# Patient Record
Sex: Female | Born: 1995 | Race: Black or African American | Hispanic: No | Marital: Single | Smoking: Never smoker
Health system: Southern US, Community
[De-identification: ages and names within clinical notes are randomized; demographics above are authoritative.]

## PROBLEM LIST (undated history)

## (undated) ENCOUNTER — Emergency Department (HOSPITAL_COMMUNITY): Admission: EM | Payer: Medicare HMO | Source: Home / Self Care

## (undated) DIAGNOSIS — I10 Essential (primary) hypertension: Secondary | ICD-10-CM

## (undated) DIAGNOSIS — H409 Unspecified glaucoma: Secondary | ICD-10-CM

## (undated) DIAGNOSIS — L309 Dermatitis, unspecified: Secondary | ICD-10-CM

## (undated) DIAGNOSIS — J45909 Unspecified asthma, uncomplicated: Secondary | ICD-10-CM

## (undated) HISTORY — PX: PORTA CATH INSERTION: CATH118285

## (undated) HISTORY — DX: Morbid (severe) obesity due to excess calories: E66.01

## (undated) HISTORY — DX: Dermatitis, unspecified: L30.9

## (undated) HISTORY — PX: OTHER SURGICAL HISTORY: SHX169

## (undated) HISTORY — PX: CATARACT EXTRACTION: SUR2

---
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
## 2017-01-11 ENCOUNTER — Other Ambulatory Visit: Payer: Self-pay | Admitting: Allergy & Immunology

## 2017-01-12 ENCOUNTER — Ambulatory Visit (INDEPENDENT_AMBULATORY_CARE_PROVIDER_SITE_OTHER): Payer: Medicaid Other | Admitting: *Deleted

## 2017-01-12 DIAGNOSIS — J309 Allergic rhinitis, unspecified: Secondary | ICD-10-CM

## 2017-01-16 ENCOUNTER — Ambulatory Visit (INDEPENDENT_AMBULATORY_CARE_PROVIDER_SITE_OTHER): Payer: Medicaid Other | Admitting: Allergy & Immunology

## 2017-01-16 VITALS — BP 122/78 | HR 88 | Temp 98.4°F | Resp 20

## 2017-01-16 DIAGNOSIS — J4540 Moderate persistent asthma, uncomplicated: Secondary | ICD-10-CM | POA: Diagnosis not present

## 2017-01-16 DIAGNOSIS — J3089 Other allergic rhinitis: Secondary | ICD-10-CM | POA: Diagnosis not present

## 2017-01-16 DIAGNOSIS — L2084 Intrinsic (allergic) eczema: Secondary | ICD-10-CM | POA: Insufficient documentation

## 2017-01-16 DIAGNOSIS — J302 Other seasonal allergic rhinitis: Secondary | ICD-10-CM

## 2017-01-16 DIAGNOSIS — J309 Allergic rhinitis, unspecified: Secondary | ICD-10-CM

## 2017-01-16 DIAGNOSIS — H1010 Acute atopic conjunctivitis, unspecified eye: Secondary | ICD-10-CM | POA: Diagnosis not present

## 2017-01-16 MED ORDER — BUDESONIDE-FORMOTEROL FUMARATE 160-4.5 MCG/ACT IN AERO: 2.0000 | INHALATION_SPRAY | Freq: Two times a day (BID) | RESPIRATORY_TRACT | 5 refills | Status: DC

## 2017-01-16 MED ORDER — FLUTICASONE PROPIONATE HFA 110 MCG/ACT IN AERO: 2.0000 | INHALATION_SPRAY | Freq: Two times a day (BID) | RESPIRATORY_TRACT | 5 refills | Status: DC

## 2017-01-16 MED ORDER — HYDROXYZINE HCL 10 MG PO TABS: 10.0000 mg | ORAL_TABLET | Freq: Every day | ORAL | 5 refills | Status: DC | PRN

## 2017-01-16 MED ORDER — ALBUTEROL SULFATE HFA 108 (90 BASE) MCG/ACT IN AERS: 2.0000 | INHALATION_SPRAY | Freq: Four times a day (QID) | RESPIRATORY_TRACT | 1 refills | Status: DC | PRN

## 2017-01-16 MED ORDER — MONTELUKAST SODIUM 10 MG PO TABS: 10.0000 mg | ORAL_TABLET | Freq: Every day | ORAL | 5 refills | Status: DC

## 2017-01-16 MED ORDER — PIMECROLIMUS 1 % EX CREA: 1.0000 "application " | TOPICAL_CREAM | Freq: Every day | CUTANEOUS | 5 refills | Status: DC | PRN

## 2017-01-16 MED ORDER — CETIRIZINE HCL 10 MG PO TABS: 10.0000 mg | ORAL_TABLET | Freq: Every day | ORAL | 5 refills | Status: DC

## 2017-01-16 MED ORDER — EPINEPHRINE 0.3 MG/0.3ML IJ SOAJ: 0.3000 mg | Freq: Once | INTRAMUSCULAR | 1 refills | Status: AC

## 2017-01-16 MED ORDER — OLOPATADINE HCL 0.1 % OP SOLN: 1.0000 [drp] | Freq: Two times a day (BID) | OPHTHALMIC | 3 refills | Status: DC
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
## 2017-01-16 NOTE — Patient Instructions (Addendum)
1. Moderate persistent asthma, uncomplicated - Lung function looks good today. - Daily controller medication(s): Symbicort 160/4.5 two puffs twice daily with spacer - Rescue medications: ProAir 4 puffs every 4-6 hours as needed - Changes during respiratory infections or worsening symptoms: add Flovent 110mcg to 2 puffs twice daily for ONE TO TWO WEEKS - Asthma control goals:  * Full participation in all desired activities (may need albuterol before activity) * Albuterol use two time or less a week on average (not counting use with activity) * Cough interfering with sleep two time or less a month * Oral steroids no more than once a year * No hospitalizations  2. Chronic allergic rhinitis  - Continue with allergy shots, but we will change you to a faster schedule.  - Continue with Singulair, cetirizine, and Astelin.   3. Atopic dermatitis - on Dupixent every two weeks - Continue to follow with Dr. Malena EdmanStamford.  - I will forward my note to her today.  - Continue with all of your moisturizing medications.   4. Multiple food allergies (peanuts, shellfish) - We will send in a new script for an EpiPen. - We can discuss testing again in the future.   5. Return in about 3 months (around 04/18/2017).  Please inform us of any Emergency Department visits, hospitalizations, or changes in symptoms. Call us before going to the ED for breathing or allergy symptoms since we might be able to fit you in for a sick visit. Feel free to contact us anytime with any questions, problems, or concerns.  It was a pleasure to see you again today! Best wishes for the New Year!   Websites that have reliable patient information: 1. American Academy of Asthma, Allergy, and Immunology: www.aaaai.org 2. Food Allergy Research and Education (FARE): foodallergy.org 3. Mothers of Asthmatics: http://www.asthmacommunitynetwork.org 4. American College of Allergy, Asthma, and Immunology: www.acaai.org

## 2017-02-03 ENCOUNTER — Ambulatory Visit (INDEPENDENT_AMBULATORY_CARE_PROVIDER_SITE_OTHER): Payer: Medicaid Other | Admitting: *Deleted

## 2017-02-03 DIAGNOSIS — Z23 Encounter for immunization: Secondary | ICD-10-CM

## 2017-02-07 ENCOUNTER — Ambulatory Visit (INDEPENDENT_AMBULATORY_CARE_PROVIDER_SITE_OTHER): Payer: Medicaid Other | Admitting: *Deleted

## 2017-02-07 DIAGNOSIS — J309 Allergic rhinitis, unspecified: Secondary | ICD-10-CM | POA: Diagnosis not present

## 2017-02-14 ENCOUNTER — Ambulatory Visit (INDEPENDENT_AMBULATORY_CARE_PROVIDER_SITE_OTHER): Payer: Medicaid Other | Admitting: *Deleted

## 2017-02-14 DIAGNOSIS — J309 Allergic rhinitis, unspecified: Secondary | ICD-10-CM

## 2017-02-26 ENCOUNTER — Other Ambulatory Visit: Payer: Self-pay | Admitting: Allergy and Immunology

## 2017-02-28 ENCOUNTER — Ambulatory Visit (INDEPENDENT_AMBULATORY_CARE_PROVIDER_SITE_OTHER): Payer: Medicaid Other | Admitting: *Deleted

## 2017-02-28 DIAGNOSIS — J309 Allergic rhinitis, unspecified: Secondary | ICD-10-CM

## 2017-03-16 ENCOUNTER — Ambulatory Visit: Payer: Medicaid Other | Admitting: Allergy & Immunology

## 2017-03-16 ENCOUNTER — Ambulatory Visit (INDEPENDENT_AMBULATORY_CARE_PROVIDER_SITE_OTHER): Payer: Medicaid Other | Admitting: Allergy & Immunology

## 2017-03-16 ENCOUNTER — Encounter: Payer: Self-pay | Admitting: Allergy & Immunology

## 2017-03-16 VITALS — BP 126/86 | HR 104 | Ht 70.0 in | Wt >= 6400 oz

## 2017-03-16 DIAGNOSIS — J4540 Moderate persistent asthma, uncomplicated: Secondary | ICD-10-CM | POA: Diagnosis not present

## 2017-03-16 DIAGNOSIS — J302 Other seasonal allergic rhinitis: Secondary | ICD-10-CM

## 2017-03-16 DIAGNOSIS — L2084 Intrinsic (allergic) eczema: Secondary | ICD-10-CM

## 2017-03-16 DIAGNOSIS — J3089 Other allergic rhinitis: Secondary | ICD-10-CM

## 2017-03-16 DIAGNOSIS — H1033 Unspecified acute conjunctivitis, bilateral: Secondary | ICD-10-CM

## 2017-03-16 MED ORDER — PREDNISOLONE ACETATE 1 % OP SUSP: 1.0000 [drp] | Freq: Two times a day (BID) | OPHTHALMIC | 0 refills | Status: DC

## 2017-03-16 MED ORDER — FLUTICASONE PROPIONATE HFA 110 MCG/ACT IN AERO: 2.0000 | INHALATION_SPRAY | Freq: Two times a day (BID) | RESPIRATORY_TRACT | 3 refills | Status: DC

## 2017-03-16 NOTE — Patient Instructions (Addendum)
1. Moderate persistent asthma, uncomplicated - Lung function looks good today.  - We will not make any medication changes at this time.  - Daily controller medication(s): Symbicort 160/4.5 two puffs twice daily with spacer - Rescue medications: ProAir 4 puffs every 4-6 hours as needed - Changes during respiratory infections or worsening symptoms: add Flovent 110mcg to 2 puffs twice daily for ONE TO TWO WEEKS - Asthma control goals:  * Full participation in all desired activities (may need albuterol before activity) * Albuterol use two time or less a week on average (not counting use with activity) * Cough interfering with sleep two time or less a month * Oral steroids no more than once a year * No hospitalizations  2. Chronic allergic rhinitis  - Continue with allergy shots at the same schedule.  - Continue with Singulair, cetirizine, and Astelin.   3. Atopic dermatitis - on Dupixent every two weeks with conjunctivitis - This is a common side effect of Dupixent, but I agree that I do not want to stop the medication. - Start Pred-Forte eye drops twice daily for a couple of weeks until you are back on a regular Dupixent schedule.  - Continue to follow with Dr. Malena EdmanStamford.  - I will forward my note to her today.  - Continue with all of your moisturizing medications.  - We will send your information to our Research Team for the upcoming new Dupixent study.   4. Multiple food allergies (peanuts, shellfish) - We will send in a new script for an EpiPen. - We can perform testing again at the next visit.   5. Return in about 4 months (around 07/15/2017).   Please inform us of any Emergency Department visits, hospitalizations, or changes in symptoms. Call us before going to the ED for breathing or allergy symptoms since we might be able to fit you in for a sick visit. Feel free to contact us anytime with any questions, problems, or concerns.  It was a pleasure to see you again today! Enjoy the  holiday season!  Websites that have reliable patient information: 1. American Academy of Asthma, Allergy, and Immunology: www.aaaai.org 2. Food Allergy Research and Education (FARE): foodallergy.org 3. Mothers of Asthmatics: http://www.asthmacommunitynetwork.org 4. American College of Allergy, Asthma, and Immunology: www.acaai.org
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
## 2017-03-29 ENCOUNTER — Ambulatory Visit (INDEPENDENT_AMBULATORY_CARE_PROVIDER_SITE_OTHER): Payer: Medicaid Other | Admitting: *Deleted

## 2017-03-29 DIAGNOSIS — J309 Allergic rhinitis, unspecified: Secondary | ICD-10-CM | POA: Diagnosis not present

## 2017-04-05 ENCOUNTER — Encounter: Payer: Self-pay | Admitting: *Deleted

## 2017-04-05 NOTE — Progress Notes (Signed)
VIALS MADE EXP: 04-05-18. HV 

## 2017-04-06 DIAGNOSIS — J3089 Other allergic rhinitis: Secondary | ICD-10-CM | POA: Diagnosis not present

## 2017-04-07 ENCOUNTER — Telehealth: Payer: Self-pay | Admitting: Allergy & Immunology

## 2017-04-07 DIAGNOSIS — J301 Allergic rhinitis due to pollen: Secondary | ICD-10-CM | POA: Diagnosis not present

## 2017-04-07 NOTE — Telephone Encounter (Signed)
Forwarding to nurse pool

## 2017-04-07 NOTE — Telephone Encounter (Signed)
Dr. Dellis AnesGallagher please advise on discontinuation.

## 2017-04-07 NOTE — Telephone Encounter (Signed)
I spoke with Tiffany Velasquez and let her know what Dr. Ellouise NewerGallagher's reply was. She has also been advised if the appointment in April 2019

## 2017-04-07 NOTE — Telephone Encounter (Signed)
That is fine. They were leaning towards that last time we talked.   Malachi BondsJoel Katleen Carraway, MD [facility] [place]

## 2017-04-07 NOTE — Telephone Encounter (Signed)
Mom called and wants to talk with Dr. Dellis AnesGallagher nurse about takiing her off the Dupixent. [phone]

## 2017-04-14 ENCOUNTER — Ambulatory Visit (INDEPENDENT_AMBULATORY_CARE_PROVIDER_SITE_OTHER): Payer: Medicaid Other

## 2017-04-14 DIAGNOSIS — J309 Allergic rhinitis, unspecified: Secondary | ICD-10-CM

## 2017-04-20 ENCOUNTER — Ambulatory Visit: Payer: Medicaid Other | Admitting: Allergy & Immunology

## 2017-05-03 ENCOUNTER — Ambulatory Visit (INDEPENDENT_AMBULATORY_CARE_PROVIDER_SITE_OTHER): Payer: Medicaid Other

## 2017-05-03 DIAGNOSIS — J309 Allergic rhinitis, unspecified: Secondary | ICD-10-CM

## 2017-05-18 ENCOUNTER — Ambulatory Visit (INDEPENDENT_AMBULATORY_CARE_PROVIDER_SITE_OTHER): Payer: Medicaid Other | Admitting: *Deleted

## 2017-05-18 DIAGNOSIS — J309 Allergic rhinitis, unspecified: Secondary | ICD-10-CM | POA: Diagnosis not present

## 2017-05-29 ENCOUNTER — Other Ambulatory Visit: Payer: Self-pay | Admitting: Allergy & Immunology

## 2017-06-01 ENCOUNTER — Ambulatory Visit (INDEPENDENT_AMBULATORY_CARE_PROVIDER_SITE_OTHER): Payer: Medicare Other | Admitting: *Deleted

## 2017-06-01 DIAGNOSIS — J309 Allergic rhinitis, unspecified: Secondary | ICD-10-CM

## 2017-06-05 ENCOUNTER — Ambulatory Visit (INDEPENDENT_AMBULATORY_CARE_PROVIDER_SITE_OTHER): Payer: Medicare Other | Admitting: *Deleted

## 2017-06-05 DIAGNOSIS — J309 Allergic rhinitis, unspecified: Secondary | ICD-10-CM | POA: Diagnosis not present

## 2017-06-15 ENCOUNTER — Encounter: Payer: Self-pay | Admitting: Family Medicine

## 2017-06-15 ENCOUNTER — Ambulatory Visit (INDEPENDENT_AMBULATORY_CARE_PROVIDER_SITE_OTHER): Payer: Medicare Other | Admitting: Family Medicine

## 2017-06-15 VITALS — BP 128/88 | HR 88 | Temp 98.7°F | Resp 16

## 2017-06-15 DIAGNOSIS — J4540 Moderate persistent asthma, uncomplicated: Secondary | ICD-10-CM

## 2017-06-15 DIAGNOSIS — T781XXD Other adverse food reactions, not elsewhere classified, subsequent encounter: Secondary | ICD-10-CM

## 2017-06-15 DIAGNOSIS — J4530 Mild persistent asthma, uncomplicated: Secondary | ICD-10-CM | POA: Insufficient documentation

## 2017-06-15 DIAGNOSIS — H1012 Acute atopic conjunctivitis, left eye: Secondary | ICD-10-CM | POA: Diagnosis not present

## 2017-06-15 DIAGNOSIS — J309 Allergic rhinitis, unspecified: Secondary | ICD-10-CM

## 2017-06-15 DIAGNOSIS — L2084 Intrinsic (allergic) eczema: Secondary | ICD-10-CM | POA: Diagnosis not present

## 2017-06-15 DIAGNOSIS — T781XXA Other adverse food reactions, not elsewhere classified, initial encounter: Secondary | ICD-10-CM | POA: Insufficient documentation

## 2017-06-15 DIAGNOSIS — H20042 Secondary noninfectious iridocyclitis, left eye: Secondary | ICD-10-CM | POA: Diagnosis not present

## 2017-06-15 MED ORDER — ERYTHROMYCIN 5 MG/GM OP OINT: TOPICAL_OINTMENT | OPHTHALMIC | 0 refills | Status: DC

## 2017-06-15 NOTE — Patient Instructions (Addendum)
Conjunctivitis - Begin erythromycin ophthalmic ointment. Apply 1/2 inch to the lower eye lid up to 4 times a day - Continue warm moist compresses. Do these before applying the medicine - We will make a referral to an opthalmology specialist as soon as possible.  Dr. Charlynne CousinsSteve Glenn who is the on-call ophthalmologist for the county will see you today at [address] [facility] [place], at 2:45 PM.  Phone number [phone]. - Apply patch over the eye to protect from light  Follow up in 1 month or sooner if needed

## 2017-06-15 NOTE — Progress Notes (Signed)
[address] [place] [address] Dept: [phone]  FOLLOW UP NOTE  Patient ID: Tiffany Velasquez, female    DOB: [birth year]  Age: 22 y.o. MRN: [id number] Date of Office Visit: 06/15/2017  Assessment  Chief Complaint: Conjunctivitis  HPI Tiffany Velasquez is a 22 year old female who presents to the clinic for a sick visit. She is accompanied by her mother who assists with history. She was last seen in this clinic on 03/16/2017 by Dr. Dellis Anes for evaluation of moderate persistent asthma, chronic allergic rhinitis, atopic dermatitis for which she receives Dupixent injection every 2 weeks with a side effect of conjunctivitis, and multiple food allergies including peanuts and shellfish.  At that time her asthma was well controlled with Symbicort 160-2 puffs twice a day with a spacer.  She was continued on allergy immunotherapy, Singulair, cetirizine, and Astelin nasal spray to maintain control of allergic rhinitis.  In the interim, she reports having many bouts of bilateral conjunctivitis and hordeolum which have been managed well with warm moist compresses in the past.  At today's visit, Synda reports that when she woke up this morning and opened her eyes her left eye was swollen, red and, tearing, and immediately sensitive to light.  She denies pain in either eye.  She denies any thick colored drainage in either eye.  She reports some pain in her left eye with eye movement especially looking up toward the ceiling.  She denies any changes in vision including double vision or blurry vision.  Tiffany Velasquez's asthma has been well controlled. She has not required rescue medication, experienced nocturnal awakenings due to lower respiratory symptoms, nor have activities of daily living been limited. She has required no Emergency Department or Urgent Care visits for her asthma. She has required zero courses of systemic steroids for asthma exacerbations since the last visit.  She is currently using  Symbicort 160-2 puffs twice a day with a spacer and has not needed to implement the Flovent 110 action plan.  Chronic rhinitis is been controlled with Singulair, cetirizine, and Astelin.  She reports allergy injections are going well and continues to receive shots every 1-2 weeks.  She has not had any accidental ingestion of peanuts or shellfish since her last visit here nor has she needed to use her EpiPen.  Her current medications are listed in the chart.  Drug Allergies:  Allergies  Allergen Reactions  . Peanut-Containing Drug Products Swelling    Throat swelling  . Shellfish Allergy Swelling    Throat swelling   . Apple   . Orange Fruit [Citrus]   . Peach Flavor   . Tomato   . Tree Extract     ALLERGIC TO TREE NUTS    Physical Exam: BP 128/88   Pulse 88   Temp 98.7 F (37.1 C) (Oral)   Resp 16   SpO2 96%    Physical Exam  Constitutional: She appears well-developed and well-nourished.  HENT:  Right Ear: External ear normal.  Left Ear: External ear normal.  Mouth/Throat: Oropharynx is clear and moist.  Bilateral nares slightly erythematous and edematous with clear nasal drainage noted.  Ears normal.  Pharynx normal.  Eyes: Pupils are equal, round, and reactive to light.  Bilateral conjunctive a injected left more than right.  Clear drainage dripping from left eye.  Eye movement in all cardinal fields possible.  I movement up toward the ceiling is reported as painful.  Periorbital swelling noted around left eye.  Photophobia in the  left eye to pen light.    Diagnostics: FVC 3.44, FEV1 2.90.  Predicted FVC 3.98, predicted FEV1 3.47.  Spirometry reveals normal ventilatory function.  Assessment and Plan: 1. Moderate persistent asthma without complication   2. Allergic rhinitis, unspecified seasonality, unspecified trigger   3. Intrinsic atopic dermatitis   4. Adverse food reaction, subsequent encounter   5. Acute atopic conjunctivitis of left eye     Meds ordered  this encounter  Medications  . erythromycin ophthalmic ointment    Sig: Apply 1/2 inch to lower eye lid up to 4 times a day    Dispense:  3.5 g    Refill:  0   Conjunctivitis - Begin erythromycin ophthalmic ointment. Apply 1/2 inch to the lower eye lid up to 4 times a day - Continue warm moist compresses. Do these before applying the medicine - We will make a referral to an opthalmology specialist as soon as possible.  Dr. Charlynne CousinsSteve Glenn who is the on-call ophthalmologist for the county will see you today at [address] [facility] [place], at 2:45 PM.  Phone number [phone]. - Apply patch over the eye to protect from light  Follow up in 1 month or sooner if needed   Return in about 1 month (around 07/16/2017), or if symptoms worsen or fail to improve.    Thank you for the opportunity to care for this patient.  Please do not hesitate to contact me with questions.  Thermon LeylandAnne Syd Newsome, FNP [facility] [place]

## 2017-06-19 DIAGNOSIS — H20042 Secondary noninfectious iridocyclitis, left eye: Secondary | ICD-10-CM | POA: Diagnosis not present

## 2017-06-28 ENCOUNTER — Encounter: Payer: Self-pay | Admitting: Family Medicine

## 2017-06-28 ENCOUNTER — Ambulatory Visit: Payer: Medicaid Other | Admitting: Family Medicine

## 2017-06-28 ENCOUNTER — Ambulatory Visit (INDEPENDENT_AMBULATORY_CARE_PROVIDER_SITE_OTHER): Payer: Medicare Other | Admitting: Family Medicine

## 2017-06-28 ENCOUNTER — Other Ambulatory Visit: Payer: Self-pay

## 2017-06-28 VITALS — BP 108/76 | HR 91 | Temp 98.2°F | Ht 70.0 in | Wt >= 6400 oz

## 2017-06-28 DIAGNOSIS — H1010 Acute atopic conjunctivitis, unspecified eye: Secondary | ICD-10-CM | POA: Diagnosis not present

## 2017-06-28 DIAGNOSIS — J069 Acute upper respiratory infection, unspecified: Secondary | ICD-10-CM | POA: Insufficient documentation

## 2017-06-28 DIAGNOSIS — J309 Allergic rhinitis, unspecified: Secondary | ICD-10-CM | POA: Diagnosis not present

## 2017-06-28 DIAGNOSIS — H20042 Secondary noninfectious iridocyclitis, left eye: Secondary | ICD-10-CM | POA: Diagnosis not present

## 2017-06-28 DIAGNOSIS — J029 Acute pharyngitis, unspecified: Secondary | ICD-10-CM | POA: Diagnosis not present

## 2017-06-28 LAB — POCT RAPID STREP A (OFFICE): RAPID STREP A SCREEN: NEGATIVE

## 2017-06-28 NOTE — Patient Instructions (Signed)
I think you have both a cold and springtime allergies.  The cold will get better.  The spring allergies will get worse before it gets better. No additional treatment.   No wheezing, The asthma seems under control.   See us again if you get worse.

## 2017-06-28 NOTE — Assessment & Plan Note (Signed)
Agree with avoid steroids.  Work on increased exercise.

## 2017-06-28 NOTE — Progress Notes (Signed)
   Subjective:    Patient ID: Stann MainlandKeyanna H Sur, female    DOB: 1995/08/27, 22 y.o.   MRN: [id number]   HPI 22 yo female sees allergist and is on allergy shots plus epipen. Albuterol plus one more inhalor.   Sore throat, nasal congestion, cough x 5 days.  Bad spring allergies.  This seems more than allergies.  Has asthma but no wheezing or shortness of breath.  Year round allergies but worse in spring.  Rhinnorrhea.  No fever.  No SOB.  Has taken lots of systemic steroid, weaning off in that they are contributing to her morbid obesity.      Review of Systems     Objective:   Physical Exam TMs canals partially occluded by wax.  I see portion of tMS and are normal Thoat mild cobblestone. Nose allergic transverse nasal crease. Neck no sig adenopathy Lungs clear.  No wheeze.  Normal work of breathing.  No tacchypnea.  No prolonged exp phase.        Assessment & Plan:

## 2017-06-28 NOTE — Assessment & Plan Note (Signed)
Part of symptoms are allergic.

## 2017-06-28 NOTE — Assessment & Plan Note (Signed)
Given abrupt worsening of symptoms, feel likely has a uri on top of her allergies.  Regardless, continue current treatment.  No antibiotics.

## 2017-07-19 DIAGNOSIS — H20042 Secondary noninfectious iridocyclitis, left eye: Secondary | ICD-10-CM | POA: Diagnosis not present

## 2017-07-20 ENCOUNTER — Ambulatory Visit: Payer: Medicare Other | Admitting: Allergy & Immunology

## 2017-08-09 DIAGNOSIS — H20043 Secondary noninfectious iridocyclitis, bilateral: Secondary | ICD-10-CM | POA: Diagnosis not present

## 2017-08-13 ENCOUNTER — Other Ambulatory Visit: Payer: Self-pay | Admitting: Allergy & Immunology

## 2017-08-17 ENCOUNTER — Ambulatory Visit: Payer: Self-pay | Admitting: *Deleted

## 2017-08-22 ENCOUNTER — Telehealth: Payer: Self-pay | Admitting: *Deleted

## 2017-08-22 NOTE — Telephone Encounter (Signed)
Patient came in to restart injection but was unable to give injections because her last injection was 06/05/17 - Red vial 0.2 both injections. Where would you like to restart? Please advise.

## 2017-08-22 NOTE — Telephone Encounter (Signed)
Let's back down to 0.45 mL of the [employer] of each.  Malachi Bonds, MD [facility]

## 2017-08-23 DIAGNOSIS — H20043 Secondary noninfectious iridocyclitis, bilateral: Secondary | ICD-10-CM | POA: Diagnosis not present

## 2017-08-24 ENCOUNTER — Encounter: Payer: Self-pay | Admitting: *Deleted

## 2017-08-31 ENCOUNTER — Telehealth: Payer: Self-pay | Admitting: Internal Medicine

## 2017-08-31 NOTE — Telephone Encounter (Signed)
Will forward to MD to make her aware and will call patient once orders have been placed. Jazmin Hartsell,CMA

## 2017-08-31 NOTE — Telephone Encounter (Signed)
Mother brought in form from [facility] requesting blood work to be done.  Form placed in dr Conception Chancygunadasas box.  Please let pt know when she can schedule the blood work

## 2017-09-01 ENCOUNTER — Other Ambulatory Visit: Payer: Self-pay | Admitting: Internal Medicine

## 2017-09-01 DIAGNOSIS — Z1159 Encounter for screening for other viral diseases: Secondary | ICD-10-CM

## 2017-09-01 DIAGNOSIS — Z202 Contact with and (suspected) exposure to infections with a predominantly sexual mode of transmission: Secondary | ICD-10-CM

## 2017-09-01 DIAGNOSIS — H209 Unspecified iridocyclitis: Secondary | ICD-10-CM

## 2017-09-01 DIAGNOSIS — D8989 Other specified disorders involving the immune mechanism, not elsewhere classified: Secondary | ICD-10-CM

## 2017-09-01 DIAGNOSIS — Z20828 Contact with and (suspected) exposure to other viral communicable diseases: Secondary | ICD-10-CM

## 2017-09-01 DIAGNOSIS — Z206 Contact with and (suspected) exposure to human immunodeficiency virus [HIV]: Secondary | ICD-10-CM

## 2017-09-01 NOTE — Progress Notes (Signed)
I have placed the lab orders requested by Dr. Sherrine [facility]plesGlenn with [facility] to rule out infectious and inflammatory causes of uveitis. Please call patient to have her schedule a lab appointment. [facility].   Marcy Sirenatherine Josiah Nieto, D.O. 09/01/2017, 1:21 PM PGY-3, [facility]

## 2017-09-01 NOTE — Progress Notes (Signed)
Tried to reach patient and mother but was unable to.  Will try calling again on Monday as number wasn't working.  Jazmin Hartsell,CMA

## 2017-09-01 NOTE — Progress Notes (Signed)
Lab appt made. Laia Wiley,CMA  

## 2017-09-03 ENCOUNTER — Encounter (HOSPITAL_COMMUNITY): Payer: Self-pay | Admitting: Emergency Medicine

## 2017-09-03 ENCOUNTER — Emergency Department (HOSPITAL_COMMUNITY)
Admission: EM | Admit: 2017-09-03 | Discharge: 2017-09-03 | Disposition: A | Discharge status: Home / Self Care | Payer: Medicare Other | Attending: Emergency Medicine | Admitting: Emergency Medicine

## 2017-09-03 DIAGNOSIS — Z79899 Other long term (current) drug therapy: Secondary | ICD-10-CM | POA: Insufficient documentation

## 2017-09-03 DIAGNOSIS — F909 Attention-deficit hyperactivity disorder, unspecified type: Secondary | ICD-10-CM | POA: Diagnosis not present

## 2017-09-03 DIAGNOSIS — J4530 Mild persistent asthma, uncomplicated: Secondary | ICD-10-CM | POA: Diagnosis not present

## 2017-09-03 DIAGNOSIS — L02211 Cutaneous abscess of abdominal wall: Secondary | ICD-10-CM | POA: Diagnosis not present

## 2017-09-03 DIAGNOSIS — Z91010 Allergy to peanuts: Secondary | ICD-10-CM | POA: Insufficient documentation

## 2017-09-03 DIAGNOSIS — L0291 Cutaneous abscess, unspecified: Secondary | ICD-10-CM

## 2017-09-03 MED ORDER — NAPROXEN 375 MG PO TABS: 375.0000 mg | ORAL_TABLET | Freq: Two times a day (BID) | ORAL | 0 refills | Status: DC

## 2017-09-03 MED ORDER — DOXYCYCLINE HYCLATE 100 MG PO CAPS: 100.0000 mg | ORAL_CAPSULE | Freq: Two times a day (BID) | ORAL | 0 refills | Status: DC

## 2017-09-03 MED ORDER — TETANUS-DIPHTH-ACELL PERTUSSIS 5-2.5-18.5 LF-MCG/0.5 IM SUSP
0.5000 mL | Freq: Once | INTRAMUSCULAR | Status: AC
  Administered 2017-09-03: 0.5 mL via INTRAMUSCULAR
  Filled 2017-09-03: qty 0.5

## 2017-09-03 MED ORDER — LIDOCAINE HCL (PF) 1 % IJ SOLN
5.0000 mL | Freq: Once | INTRAMUSCULAR | Status: AC
  Administered 2017-09-03: 5 mL
  Filled 2017-09-03: qty 5

## 2017-09-03 MED ORDER — IBUPROFEN 200 MG PO TABS
600.0000 mg | ORAL_TABLET | Freq: Once | ORAL | Status: AC
  Administered 2017-09-03: 600 mg via ORAL
  Filled 2017-09-03: qty 1

## 2017-09-03 MED ORDER — HYDROCODONE-ACETAMINOPHEN 5-325 MG PO TABS: 1.0000 | ORAL_TABLET | Freq: Once | ORAL | Status: DC

## 2017-09-03 NOTE — ED Triage Notes (Signed)
Pt has an abscess to right lower abdomen about quarter sized, no open area and no drainage. 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 Afebrile.

## 2017-09-03 NOTE — ED Provider Notes (Signed)
[facility] EMERGENCY [facility]MENT Provider Note   CSN: [id number] Arrival date & time: 09/03/17  1157     History   Chief Complaint Chief Complaint  Patient presents with  . Abscess    HPI Tiffany Velasquez is a 22 y.o. female who presents to the ED with abdominal pain due to an abscess. Patient reports swollen tender area about the size of a quarter that is raised and red. Patient with hx of same. Last night patient had nausea  HPI  Past Medical History:  Diagnosis Date  . Asthma   . Eczema     Patient Active Problem List   Diagnosis Date Noted  . Acute upper respiratory infection 06/28/2017  . Mild persistent asthma, uncomplicated 06/15/2017  . Acute atopic conjunctivitis of left eye 06/15/2017  . Adverse food reaction 06/15/2017  . Moderate persistent asthma without complication 01/16/2017  . Intrinsic atopic dermatitis 01/16/2017  . Low back pain 12/22/2015  . Allergic rhinoconjunctivitis 12/06/2014  . Food allergy 12/06/2014  . Chalazion of right upper eyelid 09/18/2014  . Healthcare maintenance 04/12/2014  . Asthma, chronic 06/22/2012  . Elevated blood pressure reading without diagnosis of hypertension 06/22/2012  . Family history of sudden cardiac death 08/04/2011  . Morbid obesity (HCC) 10/05/2009  . HALLUCINATIONS 10/05/2009  . CONSTIPATION, CHRONIC 04/16/2008  . ENCOPRESIS 05/25/2006  . ATTENTION DEFICIT, W/HYPERACTIVITY 05/25/2006  . ALLERGIC CONJUNCTIVITIS 05/25/2006  . Allergic rhinitis 05/25/2006  . Severe eczema 05/25/2006    Past Surgical History:  Procedure Laterality Date  . no past surgery       OB History   None      Home Medications    Prior to Admission medications   Medication Sig Start Date End Date Taking? Authorizing Provider  albuterol (PROAIR HFA) 108 (90 Base) MCG/ACT inhaler Inhale 2 puffs into the lungs every 6 (six) hours as needed for wheezing or shortness of breath. 01/16/17   Alfonse SpruceGallagher, Joel Louis, MD    AZELEX 20 % cream APPLY TO FACE D IN THE MORNING 08/17/15   [provider]  budesonide-formoterol (SYMBICORT) 160-4.5 MCG/ACT inhaler Inhale 2 puffs into the lungs 2 (two) times daily. 01/16/17   Alfonse SpruceGallagher, Joel Louis, MD  cetirizine (ZYRTEC) 10 MG tablet TAKE 1 TABLET BY MOUTH DAILY 08/14/17   Alfonse SpruceGallagher, Joel Louis, MD  ciclopirox (LOPROX) 0.77 % cream APPLY 1 APPLICATION TO ACTIVE RASH ON BODY BID PRN 12/01/16   [provider]  clindamycin (CLEOCIN T) 1 % lotion APPLY APPLICATION ON THE FACE D QAM 08/17/15   [provider]  clobetasol ointment (TEMOVATE) 0.05 % APPLY ON THE SKIN BID FOR ACTIVE ECZEMA 11/10/15   [provider]  desonide (DESOWEN) 0.05 % ointment APP APPLICATION ON FACE BID AS DIRECTED 12/17/15   [provider]  doxycycline (VIBRAMYCIN) 100 MG capsule Take 1 capsule (100 mg total) by mouth 2 (two) times daily. 09/03/17   Janne NapoleonNeese, Jamilex Bohnsack M, NP  DUPIXENT 300 MG/2ML SOSY INJECT 1 SYRINGE (300 MG) UNDER THE SKIN EVERY 14 DAYS Patient not taking: Reported on 06/15/2017 02/27/17   Jessica PriestKozlow, Eric J, MD  erythromycin ophthalmic ointment Apply 1/2 inch to lower eye lid up to 4 times a day 06/15/17   Hetty BlendAmbs, Anne M, FNP  fluocinonide cream (LIDEX) 0.05 %  01/22/14   [provider]  fluticasone (FLOVENT HFA) 110 MCG/ACT inhaler Inhale 2 puffs into the lungs 2 (two) times daily. 03/16/17   Alfonse SpruceGallagher, Joel Louis, MD  halobetasol (ULTRAVATE) 0.05 %  ointment APPLY TO RASH ON BODY BID PRF FLARES UTD 08/17/15   [provider]  hydrOXYzine (ATARAX/VISTARIL) 10 MG tablet Take 1 tablet (10 mg total) by mouth daily as needed. 01/16/17   Alfonse Spruce, MD  ketoconazole (NIZORAL) 2 % cream APPLY 1 APPLICATION TO RASH ON FACE BID PRN 12/01/16   [provider]  Lidocaine HCl 2 % SOLN Swallow 1 tsp 3-4 times per day for sore throat 08/01/11   Brent Bulla, MD  montelukast (SINGULAIR) 10 MG tablet Take 1 tablet (10 mg total) by mouth at  bedtime. 01/16/17   Alfonse Spruce, MD  naproxen (NAPROSYN) 375 MG tablet Take 1 tablet (375 mg total) by mouth 2 (two) times daily with a meal. 09/03/17   Dheeraj Hail, Anderson, NP  olopatadine (PATANOL) 0.1 % ophthalmic solution Place 1 drop into both eyes 2 (two) times daily. Patient not taking: Reported on 06/15/2017 01/16/17   Alfonse Spruce, MD  Olopatadine HCl (PATADAY) 0.2 % SOLN Apply 1 drop to eye daily.    [provider]  pimecrolimus (ELIDEL) 1 % cream Apply 1 application topically daily as needed. 01/16/17   Alfonse Spruce, MD  polyethylene glycol powder [facility]) powder Take 17 g by mouth daily. 08/04/11   Ardyth Gal, MD  prednisoLONE acetate (PRED FORTE) 1 % ophthalmic suspension Place 1 drop into both eyes 2 (two) times daily. Patient not taking: Reported on 06/15/2017 03/16/17   Alfonse Spruce, MD  tacrolimus (PROTOPIC) 0.1 % ointment APPLY TO RASH ON BODY BID 08/17/15   [provider]    Family History Family History  Problem Relation Age of Onset  . Sudden death Cousin   . Asthma Mother   . Allergic rhinitis Father     Social History Social History   Tobacco Use  . Smoking status: Never Smoker  . Smokeless tobacco: Never Used  Substance Use Topics  . Alcohol use: No  . Drug use: No     Allergies   Peanut-containing drug products; Shellfish allergy; Apple; Orange fruit [citrus]; Peach flavor; Tomato; and Tree extract   Review of Systems Review of Systems  Constitutional: Negative for chills and fever.  HENT: Negative.   Respiratory: Negative for cough.   Cardiovascular: Negative for chest pain.  Gastrointestinal: Negative for nausea and vomiting. Abdominal pain: at area of abscess.  Musculoskeletal: Negative for myalgias.  Skin: Positive for wound.  Neurological: Negative for headaches.  Psychiatric/Behavioral: Negative for confusion.     Physical Exam Updated Vital Signs BP 136/72 (BP Location: Right  Wrist)   Pulse 68   Temp 98.4 F (36.9 C) (Oral)   Resp 20   LMP 08/14/2017   SpO2 100%   Physical Exam  Constitutional: No distress.  Morbidly obese  HENT:  Head: Normocephalic.  Eyes: EOM are normal.  Neck: Neck supple.  Cardiovascular: Normal rate.  Pulmonary/Chest: Effort normal.  Abdominal: Soft. There is tenderness.  Raised, tender, fluctuant, area approximately 3 cm to the right lower abdomen.   Musculoskeletal: Normal range of motion.  Neurological: She is alert.  Skin: Skin is warm and dry.  Psychiatric: She has a normal mood and affect. Her behavior is normal.  Nursing note and vitals reviewed.    ED Treatments / Results  Labs (all labs ordered are listed, but only abnormal results are displayed) Labs Reviewed - No data to display  Radiology No results found.  Procedures .Marland KitchenIncision and Drainage Date/Time: 09/03/2017 2:52 PM Performed by: Damian Leavell,  Theodora Blow, NP Authorized by: Janne Napoleon, NP   Consent:    Consent obtained:  Verbal   Consent given by:  Patient   Risks discussed:  Incomplete drainage and pain   Alternatives discussed:  Alternative treatment Location:    Type:  Abscess   Size:  3 cm   Location:  Trunk   Trunk location:  Abdomen Pre-procedure details:    Skin preparation:  Betadine and Chloraprep Anesthesia (see MAR for exact dosages):    Anesthesia method:  Local infiltration   Local anesthetic:  Lidocaine 1% w/o epi Procedure type:    Complexity:  Complex Procedure details:    Needle aspiration: no     Incision types:  Single straight   Incision depth:  Dermal   Scalpel blade:  11   Wound management:  Probed and deloculated and irrigated with saline   Drainage:  Purulent and bloody   Drainage amount:  Moderate   Packing materials:  1/4 in iodoform gauze Post-procedure details:    Patient tolerance of procedure:  Tolerated well, no immediate complications   (including critical care time)  Medications Ordered in ED Medications   lidocaine (PF) (XYLOCAINE) 1 % injection 5 mL (5 mLs Infiltration Given by Other 09/03/17 1439)  Tdap (BOOSTRIX) injection 0.5 mL (0.5 mLs Intramuscular Given 09/03/17 1439)     Initial Impression / Assessment and Plan / ED Course  I have reviewed the triage vital signs and the nursing notes. 22 y.o. female with abscess to the right lower abdomen stable for d/c without fever and does not appear toxic. Will treat with antibiotics and NSAIDS and patient to f/u with PCP in 2 days for wound check and packing removal. She will return here sooner for any problems.   Final Clinical Impressions(s) / ED Diagnoses   Final diagnoses:  Abscess    ED Discharge Orders        Ordered    doxycycline (VIBRAMYCIN) 100 MG capsule  2 times daily     09/03/17 1450    naproxen (NAPROSYN) 375 MG tablet  2 times daily with meals     09/03/17 1450       Madelia, Hendley, NP 09/03/17 1457    Charlynne Pander, MD 09/05/17 0700

## 2017-09-03 NOTE — Discharge Instructions (Addendum)
[facility] in 2 days for wound check and packing removal. Return sooner for any problems.

## 2017-09-04 ENCOUNTER — Other Ambulatory Visit (INDEPENDENT_AMBULATORY_CARE_PROVIDER_SITE_OTHER): Payer: Medicare Other

## 2017-09-04 DIAGNOSIS — H209 Unspecified iridocyclitis: Secondary | ICD-10-CM | POA: Diagnosis not present

## 2017-09-04 DIAGNOSIS — Z206 Contact with and (suspected) exposure to human immunodeficiency virus [HIV]: Secondary | ICD-10-CM

## 2017-09-04 LAB — POCT URINALYSIS DIP (MANUAL ENTRY)
Bilirubin, UA: NEGATIVE
Blood, UA: NEGATIVE
Glucose, UA: NEGATIVE mg/dL
Ketones, POC UA: NEGATIVE mg/dL
Nitrite, UA: NEGATIVE
Spec Grav, UA: 1.02
Urobilinogen, UA: 1 U/dL
pH, UA: 6.5

## 2017-09-05 ENCOUNTER — Telehealth: Payer: Self-pay | Admitting: Internal Medicine

## 2017-09-05 NOTE — Telephone Encounter (Signed)
Attempted to call to ask if patient has symptoms of UTI but disconnected (no voicemail) for both numbers provided. Will try again at another time.

## 2017-09-06 ENCOUNTER — Ambulatory Visit (INDEPENDENT_AMBULATORY_CARE_PROVIDER_SITE_OTHER): Payer: Medicare Other | Admitting: Family Medicine

## 2017-09-06 ENCOUNTER — Encounter: Payer: Self-pay | Admitting: Family Medicine

## 2017-09-06 ENCOUNTER — Telehealth: Payer: Self-pay | Admitting: Internal Medicine

## 2017-09-06 ENCOUNTER — Other Ambulatory Visit: Payer: Self-pay

## 2017-09-06 VITALS — BP 110/70 | HR 78 | Temp 98.2°F | Ht 70.0 in

## 2017-09-06 DIAGNOSIS — L0291 Cutaneous abscess, unspecified: Secondary | ICD-10-CM

## 2017-09-06 NOTE — Telephone Encounter (Signed)
Called patient to report of the blood work. SHe is not having any urinary symptoms. UA showed leukocytes. No antibiotic needed at this time. There are two more tests that are pending.   Patient gave me verbal consent to send lab results to her eye doctor. Will ask staff if we need a written consent to fax results.   Eye Doctor: Dr. Shari ProwsSteven Glenn  [facility].

## 2017-09-06 NOTE — Progress Notes (Signed)
   CC: wound assessment  HPI  Patient seen in the emergency room on 6/9 with abscess under right side of her pannus. Packing was placed at that time, was started on doxycycline. No significant pain. She denies fever. No leakage around her bandage.   ROS: Denies CP, SOB, abdominal pain, dysuria, changes in BMs.   CC, SH/smoking status, and VS noted  Objective: BP 110/70   Pulse 78   Temp 98.2 F (36.8 C) (Oral)   Ht 5\' 10"  (1.778 m)   LMP 08/14/2017   BMI 61.70 kg/m  Gen: NAD, alert, cooperative, and pleasant. HEENT: NCAT, EOMI, PERRL Abd: SNTND, BS present, no guarding or organomegaly. 3cm wound with packing in place, no surrounding erythema, packing in place. No warmth.  Ext: No edema, warm Neuro: Alert and oriented, Speech clear, No gross deficits  Assessment and plan:  Wound: packing removed, base of abscess visualized without additional pus. No surrounding signs of cellulitis. Finish antibiotics and return if worsening.    Loni MuseKate Timberlake, MD, PGY2 09/07/2017 2:48 PM

## 2017-09-06 NOTE — Patient Instructions (Signed)
It was a pleasure to see you today! Thank you for choosing [facility] for your primary care. Tiffany Velasquez was seen for abcess.   Our plans for today were:  No soaking in tubs or pools until it is completely healed.   Finish your antibiotics.   Call or come back if you have concerns, fever, or worsening pain in the area.    Best,  Dr. Chanetta Marshallimberlake   Skin Abscess A skin abscess is an infected area on or under your skin that contains pus and other material. An abscess can happen almost anywhere on your body. Some abscesses break open (rupture) on their own. Most continue to get worse unless they are treated. The infection can spread deeper into the body and into your blood, which can make you feel sick. Treatment usually involves draining the abscess. Follow these instructions at home: Abscess Care  If you have an abscess that has not drained, place a warm, clean, wet washcloth over the abscess several times a day. Do this as told by your doctor.  Follow instructions from your doctor about how to take care of your abscess. Make sure you: ? Cover the abscess with a bandage (dressing). ? Change your bandage or gauze as told by your doctor. ? Wash your hands with soap and water before you change the bandage or gauze. If you cannot use soap and water, use hand sanitizer.  Check your abscess every day for signs that the infection is getting worse. Check for: ? More redness, swelling, or pain. ? More fluid or blood. ? Warmth. ? More pus or a bad smell. Medicines   Take over-the-counter and prescription medicines only as told by your doctor.  If you were prescribed an antibiotic medicine, take it as told by your doctor. Do not stop taking the antibiotic even if you start to feel better. General instructions  To avoid spreading the infection: ? Do not share personal care items, towels, or hot tubs with others. ? Avoid making skin-to-skin contact with other people.  Keep all  follow-up visits as told by your doctor. This is important. Contact a doctor if:  You have more redness, swelling, or pain around your abscess.  You have more fluid or blood coming from your abscess.  Your abscess feels warm when you touch it.  You have more pus or a bad smell coming from your abscess.  You have a fever.  Your muscles ache.  You have chills.  You feel sick. Get help right away if:  You have very bad (severe) pain.  You see red streaks on your skin spreading away from the abscess. This information is not intended to replace advice given to you by your health care provider. Make sure you discuss any questions you have with your health care provider. Document Released: 08/31/2007 Document Revised: 11/08/2015 Document Reviewed: 01/21/2015 Elsevier Interactive Patient Education  [employer]2018 [employer]

## 2017-09-07 LAB — CBC
HEMOGLOBIN: 11.9 g/dL (ref 11.1–15.9)
Hematocrit: 39.2 % (ref 34.0–46.6)
MCH: 25.3 pg — AB (ref 26.6–33.0)
MCHC: 30.4 g/dL — AB (ref 31.5–35.7)
MCV: 83 fL (ref 79–97)
PLATELETS: 313 10*3/uL (ref 150–450)
RBC: 4.71 x10E6/uL (ref 3.77–5.28)
RDW: 16.8 % — AB (ref 12.3–15.4)
WBC: 5.4 10*3/uL (ref 3.4–10.8)

## 2017-09-07 LAB — RPR: RPR: NONREACTIVE

## 2017-09-07 LAB — ANA: Anti Nuclear Antibody(ANA): NEGATIVE

## 2017-09-07 LAB — ANGIOTENSIN CONVERTING ENZYME: ANGIO CONVERT ENZYME: 52 U/L (ref 14–82)

## 2017-09-07 LAB — RHEUMATOID FACTOR

## 2017-09-07 LAB — SEDIMENTATION RATE: Sed Rate: 42 mm/hr — ABNORMAL HIGH (ref 0–32)

## 2017-09-07 LAB — FLUORESCENT TREPONEMAL AB(FTA)-IGG-BLD: FLUORESCENT TREPONEMAL AB, IGG: NONREACTIVE

## 2017-09-07 LAB — HLA-B27 ANTIGEN: HLA B27: NEGATIVE

## 2017-09-07 LAB — HIV ANTIBODY (ROUTINE TESTING W REFLEX): HIV Screen 4th Generation wRfx: NONREACTIVE

## 2017-09-07 NOTE — Telephone Encounter (Signed)
Form that was brought in by mother from Dr. Sherrine MaplesGlenn is sufficient for a written consent.  Faten Frieson,CMA

## 2017-09-07 NOTE — Telephone Encounter (Signed)
That is great, then we will fax when all the results come back.

## 2017-09-10 LAB — QUANTIFERON-TB GOLD PLUS
QUANTIFERON TB1 AG VALUE: 0.04 [IU]/mL
QUANTIFERON TB2 AG VALUE: 0.04 [IU]/mL
QuantiFERON Mitogen Value: 9.31 IU/mL
QuantiFERON Nil Value: 0.06 IU/mL
QuantiFERON-TB Gold Plus: NEGATIVE

## 2017-09-11 NOTE — Progress Notes (Signed)
All labs have resulted. I have printed the results. Please fax to appropriate location.   Also, attempted to call patient. No answer and no voicemail. Wanted to let her know that her quanterferon GOLD and HLAB27 were negative. We already discussed that these two labs were the only ones still in process and I have already explained what each one was to her. Please inform patient that both are negative and now we will fax results to her eye doctor.

## 2017-09-13 DIAGNOSIS — H2013 Chronic iridocyclitis, bilateral: Secondary | ICD-10-CM | POA: Diagnosis not present

## 2017-09-19 DIAGNOSIS — H35463 Secondary vitreoretinal degeneration, bilateral: Secondary | ICD-10-CM | POA: Diagnosis not present

## 2017-09-19 DIAGNOSIS — H30023 Focal chorioretinal inflammation of posterior pole, bilateral: Secondary | ICD-10-CM | POA: Diagnosis not present

## 2017-09-19 DIAGNOSIS — H35411 Lattice degeneration of retina, right eye: Secondary | ICD-10-CM | POA: Diagnosis not present

## 2017-10-04 DIAGNOSIS — H30021 Focal chorioretinal inflammation of posterior pole, right eye: Secondary | ICD-10-CM | POA: Diagnosis not present

## 2017-10-05 ENCOUNTER — Encounter: Payer: Self-pay | Admitting: Allergy & Immunology

## 2017-10-05 ENCOUNTER — Ambulatory Visit (INDEPENDENT_AMBULATORY_CARE_PROVIDER_SITE_OTHER): Payer: Medicare Other | Admitting: Allergy & Immunology

## 2017-10-05 VITALS — BP 118/82 | HR 87 | Temp 98.5°F | Resp 22 | Ht 69.5 in | Wt >= 6400 oz

## 2017-10-05 DIAGNOSIS — J4540 Moderate persistent asthma, uncomplicated: Secondary | ICD-10-CM

## 2017-10-05 DIAGNOSIS — J302 Other seasonal allergic rhinitis: Secondary | ICD-10-CM

## 2017-10-05 DIAGNOSIS — T7800XD Anaphylactic reaction due to unspecified food, subsequent encounter: Secondary | ICD-10-CM | POA: Diagnosis not present

## 2017-10-05 DIAGNOSIS — L2084 Intrinsic (allergic) eczema: Secondary | ICD-10-CM | POA: Diagnosis not present

## 2017-10-05 DIAGNOSIS — J3089 Other allergic rhinitis: Secondary | ICD-10-CM

## 2017-10-05 NOTE — Patient Instructions (Addendum)
1. Moderate persistent asthma, uncomplicated - Lung function looks slightly worse today.  - We will not make any medication changes at this time.  - Daily controller medication(s): Symbicort 160/4.5 two puffs twice daily with spacer - Rescue medications: ProAir 4 puffs every 4-6 hours as needed - Changes during respiratory infections or worsening symptoms: add Flovent 110mcg to 2 puffs twice daily for ONE TO TWO WEEKS - Asthma control goals:  * Full participation in all desired activities (may need albuterol before activity) * Albuterol use two time or less a week on average (not counting use with activity) * Cough interfering with sleep two time or less a month * Oral steroids no more than once a year * No hospitalizations  2. Chronic allergic rhinitis  - Resume allergy shots at the same schedule.  - Continue with Singulair, cetirizine, and Astelin.   3. Atopic dermatitis - Continue with moisturizing twice daily. - Continue with [employer] Sensitive soap.   - Continue with your medicated ointments as needed.   4. Multiple food allergies (peanuts, shellfish) - EpiPen is up to date.  - Labs drawn today. - We will call you in 1-2 weeks with those results.   5. No follow-ups on file.   Please inform us of any Emergency Department visits, hospitalizations, or changes in symptoms. Call us before going to the ED for breathing or allergy symptoms since we might be able to fit you in for a sick visit. Feel free to contact us anytime with any questions, problems, or concerns.  It was a pleasure to see you again today!  Websites that have reliable patient information: 1. American Academy of Asthma, Allergy, and Immunology: www.aaaai.org 2. Food Allergy Research and Education (FARE): foodallergy.org 3. Mothers of Asthmatics: http://www.asthmacommunitynetwork.org 4. American College of Allergy, Asthma, and Immunology: MissingWeapons.cawww.acaai.org   Make sure you are registered to vote! If you have moved or  changed any of your contact information, you will need to get this updated before voting!

## 2017-10-05 NOTE — Progress Notes (Signed)
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 Moderate persistent asthma, uncomplicated - Lung function looks slightly worse today.  - We will not make any medication changes at this time.  - Daily controller medication(s): Symbicort 160/4.5 two puffs twice daily with spacer - Rescue medications: ProAir 4 puffs every 4-6 hours as needed - Changes during respiratory infections or worsening symptoms: add Flovent 110mcg to 2 puffs twice daily for ONE TO TWO WEEKS - Asthma control goals:  * Full participation in all desired activities (may need albuterol before activity) * Albuterol use two time or less a week on average (not counting use with activity) * Cough interfering with sleep two time or less a month * Oral steroids no more than once a year * No hospitalizations  2. Chronic allergic rhinitis  - Resume allergy shots at the same schedule.  - Continue with Singulair, cetirizine, and Astelin.   3. Atopic dermatitis - Continue with moisturizing twice daily. - Continue with [employer] Sensitive soap.   - Continue with your medicated ointments as needed.   4. Multiple food allergies (peanuts, shellfish) - EpiPen is up to date.  - Labs drawn today. - We will call you in 1-2 weeks with those results.   5. Follow up in four months or earlier if needed.    Subjective:   Stann MainlandKeyanna H Clewis is a 22 y.o. female presenting today for follow up of  Chief Complaint  Patient presents with  . Follow-up    Refills needed and stuffy nose, Dr.  Hazle Quantigby didn't wnat to allergy shots until eye issue has been solved. Injection was given in left eye yesterday.    Stann MainlandKeyanna H Kensinger has a history of the following: Patient Active Problem List   Diagnosis Date Noted  . Acute upper respiratory infection 06/28/2017  . Mild persistent asthma, uncomplicated 06/15/2017  . Acute atopic conjunctivitis of left eye 06/15/2017  . Adverse food reaction 06/15/2017  . Moderate persistent asthma without complication 01/16/2017  . Intrinsic atopic dermatitis 01/16/2017  . Low back pain 12/22/2015  . Allergic rhinoconjunctivitis 12/06/2014  . Food allergy 12/06/2014  . Chalazion of right upper eyelid 09/18/2014  . Healthcare maintenance 04/12/2014  . Asthma, chronic 06/22/2012  . Elevated blood pressure reading without diagnosis of hypertension 06/22/2012  . Family history of sudden cardiac death 08/04/2011  . Morbid obesity (HCC) 10/05/2009  . HALLUCINATIONS 10/05/2009  . CONSTIPATION, CHRONIC 04/16/2008  . ENCOPRESIS 05/25/2006  . ATTENTION DEFICIT, W/HYPERACTIVITY 05/25/2006  . ALLERGIC CONJUNCTIVITIS 05/25/2006  . Allergic rhinitis 05/25/2006  . Severe eczema 05/25/2006    History obtained from: chart review and patient.  Payton SparkKeyanna H Hegstrom's Primary Care Provider is Minus Libertyiccio, Angela C, DO.     Vesta is a 22 y.o. female presenting for a follow up visit.  She was last seen in March 2019 for a sick visit.  At that time, she continued to have ocular symptoms including conjunctivitis with tearing and light sensitivity.  All of this it started after she had a lapse in her Dupixent dosing over the winter.  We initially treated it with some  topical steroid drops as well as mast cell stabilizers.  At the last visit, she was denying any double vision or blurry vision.  She was endorsing some pain in her left eye with eye movement especially when looking straight up.  We did call ophthalmology for an emergent visit, and we were able to get her in that day.  It  turns out she was diagnosed with iritis, although it was unsure whether this was related to her to her Dupixent.   In the inteirm, she had had ocular injections in her eye for iritis (today she had her right eye injected). She was referred to see Dr. Lelon Perla. Inflammation was improving but it was resolving slowly. A rod was placed in her eye to distribute the medication over time. She is on an antibiotic drop and artifical tears. She is also using cool compresses. It was recommended that she stop all of her injections for now since they were not sure why was having this problem. She did have a large autoimmune workup which was normal. There was no trigger for her iritis appreciated. Evidently iritis without any known trigger is very rare. They are unsure how long she will need these steroid injections into her eye, but Dr. Lelon Perla and Dr. Sherrine Maples are both fine with her restarting her allergy shots.   Since the last visit, she has done very well from an asthma perspective.  She remains on Symbicort 160/4.5 mcg 2 puffs twice daily.  She has not needed her rescue medication much at all.  She does have Flovent 110 mcg to use as needed with respiratory flares.  She feels that she has done very well, but she does admit that she has spent a lot of time indoors since she knows outdoors are trigger for her. Dene's asthma has been well controlled. She has not required rescue medication, experienced nocturnal awakenings due to lower respiratory symptoms, nor have activities of daily living been limited. She has required no Emergency Department or Urgent Care visits for her asthma. She has required zero courses of systemic steroids for asthma exacerbations since the last visit. ACT score today is 16, indicating subpar asthma symptom control.   She has had a rough time from an allergic rhinitis standpoint.  She has spent more time indoors than she normally does since she has no longer on the shots.  She is interested in  restarting the shots at this time.  She does have her nasal spray which she is using.  She is also on montelukast as well as cetirizine.  Her skin has been under good control.  She has switched to using Dove sensitive skin.  She is using over-the-counter emollients 2-3 times daily and feels that this is providing good control of her symptoms.  She does have a variety of prescribed topical steroids, but she has not needed them.  She is going to see her dermatologist next week.  Her normal dermatologist is Dr[facility] Leonie [facility] at [facility], but she is currently out on maternity leave for her third child.   She has lost approximately 10 pounds since her last visit.  She reports that she is increasing her physical activity, even if it is just going for a walk. Otherwise, there have been no changes to her past medical history, surgical history, family history, or social history.    Review of Systems: a 14-point review of systems is pertinent for what is mentioned in HPI.  Otherwise, all other systems were negative. Constitutional:  negative other than that listed in the HPI Eyes: negative other than that listed in the HPI Ears, nose, mouth, throat, and face: negative other than that listed in the HPI Respiratory: negative other than that listed in the HPI Cardiovascular: negative other than that listed in the HPI Gastrointestinal: negative other than that listed in the HPI Genitourinary: negative other than that listed in the HPI Integument: negative other than that listed in the HPI Hematologic: negative other than that listed in the HPI Musculoskeletal: negative other than that listed in the HPI Neurological: negative other than that listed in the HPI Allergy/Immunologic: negative other than that listed in the HPI    Objective:   Blood pressure 118/82, pulse 87, temperature 98.5 F (36.9 C), temperature source Oral, resp. rate (!) 22, height 5' 9.5" (1.765 m), weight (!) 435  lb 3.2 oz (197.4 kg), SpO2 94 %. Body mass index is 63.35 kg/m.   Physical Exam:  General: Alert, interactive, in no acute distress. Obese female. Talkative.  Eyes: No conjunctival injection bilaterally, no discharge on the right, no discharge on the left and no Horner-Trantas dots present. PERRL bilaterally. EOMI without pain. No photophobia.  Ears: Right TM pearly gray with normal light reflex, Left TM pearly gray with normal light reflex, Right TM intact without perforation and Left TM intact without perforation.  Nose/Throat: External nose within normal limits and septum midline. Turbinates edematous with clear discharge. Posterior oropharynx erythematous with cobblestoning in the posterior oropharynx. Tonsils 2+ without exudates.  Tongue without thrush. Lungs: Clear to auscultation without wheezing, rhonchi or rales. No increased work of breathing. CV: Normal S1/S2. No murmurs. Capillary refill <2 seconds.  Skin: Warm and dry, without lesions or rashes. Neuro:   Grossly intact. No focal deficits appreciated. Responsive to questions.  Diagnostic studies:   Spirometry: results normal (FEV1: 2.40/71%, FVC: 2.91/75%, FEV1/FVC: 82%).    Spirometry consistent with normal pattern.  Allergy Studies: none      Malachi Bonds, MD  [facility]

## 2017-10-06 NOTE — Addendum Note (Signed)
Addended by: Florence CannerSWEENEY, Nyheem Binette on: 10/06/2017 04:13 PM   Modules accepted: Orders

## 2017-10-08 LAB — ALLERGY PANEL 19, SEAFOOD GROUP
Allergen Salmon IgE: 0.15 kU/L — AB
CODFISH IGE: 0.14 kU/L — AB
Catfish: <0.1 kU/L
F023-IgE Crab: 29.4 kU/L — AB
F080-IgE Lobster: 34.4 kU/L — AB
Shrimp IgE: 39.7 kU/L — AB
TUNA: 0.16 kU/L — AB

## 2017-10-08 LAB — CBC WITH DIFFERENTIAL/PLATELET
BASOS: 0 %
Basophils Absolute: 0 10*3/uL (ref 0.0–0.2)
EOS (ABSOLUTE): 0.2 10*3/uL (ref 0.0–0.4)
Eos: 4 %
HEMOGLOBIN: 12.3 g/dL (ref 11.1–15.9)
Hematocrit: 40.2 % (ref 34.0–46.6)
IMMATURE GRANS (ABS): 0 10*3/uL (ref 0.0–0.1)
IMMATURE GRANULOCYTES: 0 %
Lymphocytes Absolute: 1.4 10*3/uL (ref 0.7–3.1)
Lymphs: 23 %
MCH: 25.1 pg — AB (ref 26.6–33.0)
MCHC: 30.6 g/dL — ABNORMAL LOW (ref 31.5–35.7)
MCV: 82 fL (ref 79–97)
Monocytes Absolute: 0.5 10*3/uL (ref 0.1–0.9)
Monocytes: 8 %
NEUTROS ABS: 4 10*3/uL (ref 1.4–7.0)
NEUTROS PCT: 65 %
PLATELETS: 305 10*3/uL (ref 150–450)
RBC: 4.91 x10E6/uL (ref 3.77–5.28)
RDW: 17.2 % — ABNORMAL HIGH (ref 12.3–15.4)
WBC: 6.1 10*3/uL (ref 3.4–10.8)

## 2017-10-08 LAB — ALLERGY PANEL 18, NUT MIX GROUP
ALLERGEN COCONUT IGE: 0.68 kU/L — AB
F020-IgE Almond: 0.61 kU/L — AB
F202-IgE Cashew Nut: 0.13 kU/L — AB
Hazelnut (Filbert) IgE: 5.66 kU/L — AB
Peanut IgE: 0.94 kU/L — AB
Pecan Nut IgE: <0.1 kU/L
SESAME SEED IGE: 0.92 kU/L — AB

## 2017-10-08 LAB — IGE PEANUT COMPONENT PROFILE
F352-IgE Ara h 8: 6.09 kU/L — AB
F423-IgE Ara h 2: <0.1 kU/L
F424-IgE Ara h 3: <0.1 kU/L
F427-IgE Ara h 9: <0.1 kU/L
F447-IgE Ara h 6: <0.1 kU/L

## 2017-10-09 ENCOUNTER — Telehealth: Payer: Self-pay | Admitting: *Deleted

## 2017-10-09 NOTE — Telephone Encounter (Signed)
Patient had appointment with Dr Dellis AnesGallagher on 10/05/17 she will be restarting allergy injections. Per Dr Dellis AnesGallagher restart on Gold 1:10,000 @ 0.05 build up schedule C

## 2017-10-10 ENCOUNTER — Encounter: Payer: Self-pay | Admitting: *Deleted

## 2017-10-26 DIAGNOSIS — L2089 Other atopic dermatitis: Secondary | ICD-10-CM | POA: Diagnosis not present

## 2017-10-30 DIAGNOSIS — H2013 Chronic iridocyclitis, bilateral: Secondary | ICD-10-CM | POA: Diagnosis not present

## 2017-10-30 DIAGNOSIS — H35411 Lattice degeneration of retina, right eye: Secondary | ICD-10-CM | POA: Diagnosis not present

## 2017-10-30 DIAGNOSIS — H21542 Posterior synechiae (iris), left eye: Secondary | ICD-10-CM | POA: Diagnosis not present

## 2017-10-30 DIAGNOSIS — H40051 Ocular hypertension, right eye: Secondary | ICD-10-CM | POA: Diagnosis not present

## 2017-11-08 DIAGNOSIS — H35463 Secondary vitreoretinal degeneration, bilateral: Secondary | ICD-10-CM | POA: Diagnosis not present

## 2017-11-08 DIAGNOSIS — H35411 Lattice degeneration of retina, right eye: Secondary | ICD-10-CM | POA: Diagnosis not present

## 2017-11-08 DIAGNOSIS — H40051 Ocular hypertension, right eye: Secondary | ICD-10-CM | POA: Diagnosis not present

## 2017-11-08 DIAGNOSIS — H30023 Focal chorioretinal inflammation of posterior pole, bilateral: Secondary | ICD-10-CM | POA: Diagnosis not present

## 2017-11-10 DIAGNOSIS — H40041 Steroid responder, right eye: Secondary | ICD-10-CM | POA: Diagnosis not present

## 2017-11-10 DIAGNOSIS — H40051 Ocular hypertension, right eye: Secondary | ICD-10-CM | POA: Diagnosis not present

## 2017-11-17 DIAGNOSIS — H40043 Steroid responder, bilateral: Secondary | ICD-10-CM | POA: Diagnosis not present

## 2017-11-17 DIAGNOSIS — H30023 Focal chorioretinal inflammation of posterior pole, bilateral: Secondary | ICD-10-CM | POA: Diagnosis not present

## 2017-11-17 DIAGNOSIS — H40053 Ocular hypertension, bilateral: Secondary | ICD-10-CM | POA: Diagnosis not present

## 2017-11-22 DIAGNOSIS — H40053 Ocular hypertension, bilateral: Secondary | ICD-10-CM | POA: Diagnosis not present

## 2017-11-22 DIAGNOSIS — H30023 Focal chorioretinal inflammation of posterior pole, bilateral: Secondary | ICD-10-CM | POA: Diagnosis not present

## 2017-11-22 DIAGNOSIS — H40043 Steroid responder, bilateral: Secondary | ICD-10-CM | POA: Diagnosis not present

## 2017-12-04 DIAGNOSIS — H21542 Posterior synechiae (iris), left eye: Secondary | ICD-10-CM | POA: Diagnosis not present

## 2017-12-04 DIAGNOSIS — H35411 Lattice degeneration of retina, right eye: Secondary | ICD-10-CM | POA: Diagnosis not present

## 2017-12-04 DIAGNOSIS — H2013 Chronic iridocyclitis, bilateral: Secondary | ICD-10-CM | POA: Diagnosis not present

## 2017-12-04 DIAGNOSIS — H40043 Steroid responder, bilateral: Secondary | ICD-10-CM | POA: Diagnosis not present

## 2017-12-07 DIAGNOSIS — H21542 Posterior synechiae (iris), left eye: Secondary | ICD-10-CM | POA: Diagnosis not present

## 2017-12-07 DIAGNOSIS — H35411 Lattice degeneration of retina, right eye: Secondary | ICD-10-CM | POA: Diagnosis not present

## 2017-12-07 DIAGNOSIS — H2013 Chronic iridocyclitis, bilateral: Secondary | ICD-10-CM | POA: Diagnosis not present

## 2017-12-07 DIAGNOSIS — H40043 Steroid responder, bilateral: Secondary | ICD-10-CM | POA: Diagnosis not present

## 2017-12-11 DIAGNOSIS — H21542 Posterior synechiae (iris), left eye: Secondary | ICD-10-CM | POA: Diagnosis not present

## 2017-12-11 DIAGNOSIS — H35411 Lattice degeneration of retina, right eye: Secondary | ICD-10-CM | POA: Diagnosis not present

## 2017-12-11 DIAGNOSIS — H2013 Chronic iridocyclitis, bilateral: Secondary | ICD-10-CM | POA: Diagnosis not present

## 2017-12-11 DIAGNOSIS — H40043 Steroid responder, bilateral: Secondary | ICD-10-CM | POA: Diagnosis not present

## 2017-12-15 DIAGNOSIS — H3581 Retinal edema: Secondary | ICD-10-CM | POA: Diagnosis not present

## 2017-12-15 DIAGNOSIS — H30033 Focal chorioretinal inflammation, peripheral, bilateral: Secondary | ICD-10-CM | POA: Diagnosis not present

## 2017-12-15 DIAGNOSIS — Z79899 Other long term (current) drug therapy: Secondary | ICD-10-CM | POA: Diagnosis not present

## 2017-12-15 DIAGNOSIS — H40043 Steroid responder, bilateral: Secondary | ICD-10-CM | POA: Diagnosis not present

## 2017-12-29 DIAGNOSIS — H40043 Steroid responder, bilateral: Secondary | ICD-10-CM | POA: Diagnosis not present

## 2017-12-29 DIAGNOSIS — H30033 Focal chorioretinal inflammation, peripheral, bilateral: Secondary | ICD-10-CM | POA: Diagnosis not present

## 2017-12-29 DIAGNOSIS — H40003 Preglaucoma, unspecified, bilateral: Secondary | ICD-10-CM | POA: Diagnosis not present

## 2018-02-13 DIAGNOSIS — H30033 Focal chorioretinal inflammation, peripheral, bilateral: Secondary | ICD-10-CM | POA: Diagnosis not present

## 2018-02-13 DIAGNOSIS — H3581 Retinal edema: Secondary | ICD-10-CM | POA: Diagnosis not present

## 2018-02-13 DIAGNOSIS — H40043 Steroid responder, bilateral: Secondary | ICD-10-CM | POA: Diagnosis not present

## 2018-02-13 DIAGNOSIS — H40003 Preglaucoma, unspecified, bilateral: Secondary | ICD-10-CM | POA: Diagnosis not present

## 2018-02-13 DIAGNOSIS — Z79899 Other long term (current) drug therapy: Secondary | ICD-10-CM | POA: Diagnosis not present

## 2018-02-21 ENCOUNTER — Ambulatory Visit (INDEPENDENT_AMBULATORY_CARE_PROVIDER_SITE_OTHER): Payer: Medicare Other

## 2018-02-21 DIAGNOSIS — Z23 Encounter for immunization: Secondary | ICD-10-CM | POA: Diagnosis not present

## 2018-03-04 ENCOUNTER — Other Ambulatory Visit: Payer: Self-pay | Admitting: Allergy & Immunology

## 2018-03-27 DIAGNOSIS — H30033 Focal chorioretinal inflammation, peripheral, bilateral: Secondary | ICD-10-CM | POA: Diagnosis not present

## 2018-03-27 DIAGNOSIS — H3581 Retinal edema: Secondary | ICD-10-CM | POA: Diagnosis not present

## 2018-03-27 DIAGNOSIS — H40003 Preglaucoma, unspecified, bilateral: Secondary | ICD-10-CM | POA: Diagnosis not present

## 2018-03-27 DIAGNOSIS — H40043 Steroid responder, bilateral: Secondary | ICD-10-CM | POA: Diagnosis not present

## 2018-03-27 DIAGNOSIS — Z79899 Other long term (current) drug therapy: Secondary | ICD-10-CM | POA: Diagnosis not present

## 2018-04-02 ENCOUNTER — Other Ambulatory Visit: Payer: Self-pay | Admitting: Allergy & Immunology

## 2018-06-05 DIAGNOSIS — H21543 Posterior synechiae (iris), bilateral: Secondary | ICD-10-CM | POA: Diagnosis not present

## 2018-06-05 DIAGNOSIS — H3581 Retinal edema: Secondary | ICD-10-CM | POA: Diagnosis not present

## 2018-06-05 DIAGNOSIS — H40043 Steroid responder, bilateral: Secondary | ICD-10-CM | POA: Diagnosis not present

## 2018-06-05 DIAGNOSIS — H30033 Focal chorioretinal inflammation, peripheral, bilateral: Secondary | ICD-10-CM | POA: Diagnosis not present

## 2018-06-05 DIAGNOSIS — H4063X0 Glaucoma secondary to drugs, bilateral, stage unspecified: Secondary | ICD-10-CM | POA: Diagnosis not present

## 2018-06-05 DIAGNOSIS — Z79899 Other long term (current) drug therapy: Secondary | ICD-10-CM | POA: Diagnosis not present

## 2018-06-05 DIAGNOSIS — T380X5A Adverse effect of glucocorticoids and synthetic analogues, initial encounter: Secondary | ICD-10-CM | POA: Diagnosis not present

## 2018-07-13 DIAGNOSIS — Z79899 Other long term (current) drug therapy: Secondary | ICD-10-CM | POA: Diagnosis not present

## 2018-07-13 DIAGNOSIS — H3581 Retinal edema: Secondary | ICD-10-CM | POA: Diagnosis not present

## 2018-07-13 DIAGNOSIS — H30033 Focal chorioretinal inflammation, peripheral, bilateral: Secondary | ICD-10-CM | POA: Insufficient documentation

## 2018-07-13 DIAGNOSIS — H44113 Panuveitis, bilateral: Secondary | ICD-10-CM | POA: Insufficient documentation

## 2018-07-13 DIAGNOSIS — H40043 Steroid responder, bilateral: Secondary | ICD-10-CM | POA: Insufficient documentation

## 2018-07-13 DIAGNOSIS — H40003 Preglaucoma, unspecified, bilateral: Secondary | ICD-10-CM | POA: Diagnosis not present

## 2018-07-26 ENCOUNTER — Ambulatory Visit (INDEPENDENT_AMBULATORY_CARE_PROVIDER_SITE_OTHER): Payer: Medicare Other | Admitting: Allergy & Immunology

## 2018-07-26 ENCOUNTER — Other Ambulatory Visit: Payer: Self-pay

## 2018-07-26 ENCOUNTER — Encounter: Payer: Self-pay | Admitting: Allergy & Immunology

## 2018-07-26 DIAGNOSIS — T7800XD Anaphylactic reaction due to unspecified food, subsequent encounter: Secondary | ICD-10-CM | POA: Diagnosis not present

## 2018-07-26 DIAGNOSIS — J3089 Other allergic rhinitis: Secondary | ICD-10-CM

## 2018-07-26 DIAGNOSIS — L2084 Intrinsic (allergic) eczema: Secondary | ICD-10-CM

## 2018-07-26 DIAGNOSIS — J302 Other seasonal allergic rhinitis: Secondary | ICD-10-CM

## 2018-07-26 DIAGNOSIS — J4540 Moderate persistent asthma, uncomplicated: Secondary | ICD-10-CM | POA: Diagnosis not present

## 2018-07-26 DIAGNOSIS — H209 Unspecified iridocyclitis: Secondary | ICD-10-CM | POA: Diagnosis not present

## 2018-07-26 MED ORDER — FLUTICASONE PROPIONATE HFA 110 MCG/ACT IN AERO: 2.0000 | INHALATION_SPRAY | Freq: Two times a day (BID) | RESPIRATORY_TRACT | 5 refills | Status: DC

## 2018-07-26 MED ORDER — CETIRIZINE HCL 10 MG PO TABS: 10.0000 mg | ORAL_TABLET | Freq: Every day | ORAL | 5 refills | Status: DC

## 2018-07-26 MED ORDER — EPINEPHRINE 0.3 MG/0.3ML IJ SOAJ: 0.3000 mg | Freq: Once | INTRAMUSCULAR | 2 refills | Status: AC

## 2018-07-26 MED ORDER — BUDESONIDE-FORMOTEROL FUMARATE 160-4.5 MCG/ACT IN AERO: 2.0000 | INHALATION_SPRAY | Freq: Two times a day (BID) | RESPIRATORY_TRACT | 5 refills | Status: DC

## 2018-07-26 MED ORDER — ALBUTEROL SULFATE HFA 108 (90 BASE) MCG/ACT IN AERS
2.0000 | INHALATION_SPRAY | Freq: Four times a day (QID) | RESPIRATORY_TRACT | 1 refills | Status: DC | PRN
Start: 2018-07-26 — End: 2018-11-27

## 2018-07-26 MED ORDER — MONTELUKAST SODIUM 10 MG PO TABS: 10.0000 mg | ORAL_TABLET | Freq: Every day | ORAL | 5 refills | Status: DC

## 2018-07-26 NOTE — Patient Instructions (Addendum)
1. Moderate persistent asthma, uncomplicated - We will not make any changes to your breathing regimen at this time. - Daily controller medication(s): Symbicort 160/4.5 two puffs twice daily with spacer - Rescue medications: ProAir 4 puffs every 4-6 hours as needed - Changes during respiratory infections or worsening symptoms: add Flovent to 2 puffs twice daily for ONE TO TWO WEEKS - Asthma control goals:  * Full participation in all desired activities (may need albuterol before activity) * Albuterol use two time or less a week on average (not counting use with activity) * Cough interfering with sleep two time or less a month * Oral steroids no more than once a year * No hospitalizations  2. Chronic allergic rhinitis  - Resume allergy shots at the same schedule.  - Continue with Singulair, cetirizine, and Astelin.   3. Atopic dermatitis - Continue with moisturizing twice daily. - Continue with [employer].   - Continue with your medicated ointments as needed.  - I will send the note to Dr. Floyce Stakes.   4. Multiple food allergies (peanuts, shellfish, multiple fruits/vegetables) - EpiPen refilled today. - Continue to avoid peanuts, tree nuts, and shellfish.   5. No follow-ups on file. This can be an in-person, a virtual Webex or a telephone follow up visit.   Please inform us of any Emergency Department visits, hospitalizations, or changes in symptoms. Call us before going to the ED for breathing or allergy symptoms since we might be able to fit you in for a sick visit. Feel free to contact us anytime with any questions, problems, or concerns.  It was a pleasure to talk to you today today!  Websites that have reliable patient information: 1. American Academy of Asthma, Allergy, and Immunology: www.aaaai.org 2. Food Allergy Research and Education (FARE): foodallergy.org 3. Mothers of Asthmatics: http://www.asthmacommunitynetwork.org 4. American College of  Allergy, Asthma, and Immunology: www.acaai.org  "Like" [place] on Facebook and Instagram for our latest updates!      Make sure you are registered to vote! If you have moved or changed any of your contact information, you will need to get this updated before voting!    Voter ID laws are NOT going into effect for the General Election in November 2020! DO NOT let this stop you from exercising your right to vote!

## 2018-07-26 NOTE — Progress Notes (Signed)
RE: Tiffany Velasquez MRN: [id number] DOB: December 06, 1995 Date of Telemedicine Visit: 07/26/2018  Referring provider: Tillman Sersiccio, Angela C, DO Primary care provider: Tillman Sersiccio, Angela C, DO  Chief Complaint: Follow-up; Shortness of Breath; and Nasal Congestion (runny nose)   Telemedicine Follow Up Visit via Telephone: I connected with Tiffany Velasquez for a follow up on 07/26/18 by telephone and verified that I am speaking with the correct person using two identifiers.   I discussed the limitations, risks, security and privacy concerns of performing an evaluation and management service by telephone and the availability of in person appointments. I also discussed with the patient that there may be a patient responsible charge related to this service. The patient expressed understanding and agreed to proceed.  Patient is at home accompanied by her mother, but Tiffany Velasquez is the only one I talked to regarding her history.   Provider is at the office.  Visit start time: 11:02 AM Visit end time: 11:39 AM Insurance consent/check in by: [employer] Medical consent and medical assistant/nurse: Ashleigh  History of Present Illness:  She is a 23 y.o. female, who is being followed for persistent asthma, food allergies, atopic dermatitis, and P/SAR. Her previous allergy office visit was in July 2019 with Dr. Dellis AnesGallagher.  She was last seen by me in July 2019.  At that time, her lung function looked slightly worse.  We did not change any medications since her compliance is rather strained.  We continued Symbicort 160/4.5 mcg 2 puffs twice daily with Flovent 110 mcg 2 puffs twice daily added during respiratory flares.  We did recommend resuming her allergy shots, but she has not done this.  Continued with Singulair, cetirizine, and Astelin.  For her atopic dermatitis we continued with all of her prescription ointments as well as moisturizing twice daily.  She has a history of anaphylaxis to peanuts and shellfish.  We did draw  labs to see where her levels were at the last visit.  Her component testing showed that her peanut IgE was isolated to Ara h 8.  We did offer a food challenge, but she was not interested in this.  Food panel was positive to all of the shellfish with lower IgE levels to the fin fish.  A nut panel showed elevated IgE to hazelnut with lower amounts to the rest of the panel.  We continued to hold her Dupixent at the last visit due to her iritis.  She had an autoimmune work-up which was negative.  She was placed on intermittent steroid injections to control the inflammation as well as antibiotic eyedrops.  Since the last visit, she has done well. She tells me that she has remained indoors for the most part during the pandemic. Her body has become accustomed to the indoor environment. She reports that she developed immediate allergy symptoms since her body "was not used to it". Everything "hit [her] at once".   Asthma/Respiratory Symptom History: She reports that her asthma is under fairly good control. She tells me that she has not had any problems since she has remained in the house. The only time that she was out was when she had to go her eye doctor appointments. She ran out of her Symbicort yesterday.   Allergic Rhinitis Symptom History: She has done fairly well with regards to her symptoms since she has not been going outdoors. She has remained on all of her antihistamines including cetirizine and the montelukast. She never restarted her allergy shots, although she was told last  week that she could restart her allergy shots.  Food Allergy Symptom History: She continues to avoid peanuts, tree nuts, and seafood. She will occasionally eat some fin fish, but she is not a big seafood person at all. She does need a new EpiPen.  Eczema Symptom Symptom History: She still has some cracking on her skin. She is scheduled to see Dr. Floyce Stakes this next week. She has a combination of medications prescribed  by Dr. Leonie Man.   She is now on Humira which she gets weekly. She gets these injections at home. She is no longer getting injections into her eye. She is now seeing Dr. Gae Bon. Her pressure in her eye is down to 10 and 15 in each eye. She is now working on controlling the inflammation. She did stop the methotrexate, but is now on azathioprine.   Otherwise, there have been no changes to her past medical history, surgical history, family history, or social history.  Assessment and Plan:  Tiffany Velasquez is a 23 y.o. 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 Moderate persistent asthma, uncomplicated - We will not make any changes to your breathing regimen at this time. - Daily controller medication(s): Symbicort 160/4.5 two puffs twice daily with spacer - Rescue medications: ProAir 4 puffs every 4-6 hours as needed - Changes during respiratory infections or worsening symptoms: add Flovent to 2 puffs twice daily for ONE TO TWO WEEKS - Asthma control goals:  * Full participation in all desired activities (may need albuterol before activity) * Albuterol use two time or less a week on average (not counting use with activity) * Cough interfering with sleep two time or less a month * Oral steroids no more than once a year * No hospitalizations  2. Chronic allergic rhinitis  - Resume allergy shots at the same schedule.  - Continue with Singulair, cetirizine, and Astelin.   3. Atopic dermatitis - Continue with moisturizing twice daily. - Continue with [employer].   - Continue with your medicated ointments as needed.  - I will send the note to Dr. Floyce Stakes.   4. Multiple food allergies (peanuts, shellfish, multiple fruits/vegetables) - EpiPen refilled today. - Continue to avoid peanuts, tree nuts, and shellfish.   5. Follow up in three months. This can be an in-person, a virtual Webex or a telephone follow up visit.    Diagnostics: None.  Medication List:  Current Outpatient Medications  Medication Sig Dispense Refill   albuterol (PROAIR HFA) 108 (90 Base) MCG/ACT inhaler Inhale 2 puffs into the lungs every 6 (six) hours as needed for wheezing or shortness of breath. 1 Inhaler 1   AZELEX 20 % cream APPLY TO FACE D IN THE MORNING  3   budesonide-formoterol (SYMBICORT) 160-4.5 MCG/ACT inhaler Inhale 2 puffs into the lungs 2 (two) times daily. 1 Inhaler 5   cetirizine (ZYRTEC) 10 MG tablet TAKE 1 TABLET BY MOUTH DAILY 30 tablet 0   ciclopirox (LOPROX) 0.77 % cream APPLY 1 APPLICATION TO ACTIVE RASH ON BODY BID PRN  1   clindamycin (CLEOCIN T) 1 % lotion APPLY APPLICATION ON THE FACE D QAM  3  clobetasol ointment (TEMOVATE) 0.05 % APPLY ON THE SKIN BID FOR ACTIVE ECZEMA  0   desonide (DESOWEN) 0.05 % ointment APP APPLICATION ON FACE BID AS DIRECTED  0   fluocinonide cream (LIDEX) 0.05 %   0   fluticasone (FLOVENT HFA) 110 MCG/ACT inhaler Inhale 2 puffs into the lungs 2 (two) times daily. 1 Inhaler 3   halobetasol (ULTRAVATE) 0.05 % ointment APPLY TO RASH ON BODY BID PRF FLARES UTD  2   hydrOXYzine (ATARAX/VISTARIL) 10 MG tablet Take 1 tablet (10 mg total) by mouth daily as needed. 30 tablet 5   ketoconazole (NIZORAL) 2 % cream APPLY 1 APPLICATION TO RASH ON FACE BID PRN  1   montelukast (SINGULAIR) 10 MG tablet Take 1 tablet (10 mg total) by mouth at bedtime. 30 tablet 5   naproxen (NAPROSYN) 375 MG tablet Take 1 tablet (375 mg total) by mouth 2 (two) times daily with a meal. 20 tablet 0   pimecrolimus (ELIDEL) 1  % cream Apply 1 application topically daily as needed. 30 g 5   polyethylene glycol powder (MIRALAX) powder Take 17 g by mouth daily. 255 g 5   tacrolimus (PROTOPIC) 0.1 % ointment APPLY TO RASH ON BODY BID  1   No current facility-administered medications for this visit.    Allergies: Allergies  Allergen Reactions   Peanut-Containing Drug Products Swelling    Throat swelling   Shellfish Allergy Swelling    Throat swelling    Apple    Orange Fruit [Citrus]    Peach Flavor    Tomato    Tree Extract     ALLERGIC TO TREE NUTS   I reviewed her past medical history, social history, family history, and environmental history and no significant changes have been reported from previous visits.  Review of Systems  Objective:  Physical exam not obtained as encounter was done via telephone.   Previous notes and tests were reviewed.  I discussed the assessment and treatment plan with the patient. The patient was provided an opportunity to ask questions and all were answered. The patient agreed with the plan and demonstrated an understanding of the instructions.   The patient was advised to call back or seek an in-person evaluation if the symptoms worsen or if the condition fails to improve as anticipated.  I provided 37 minutes of non-face-to-face time during this encounter.  It was my pleasure to participate in [place]'s care today. Please feel free to contact me with any questions or concerns.   Sincerely,  Alfonse Spruce, MD

## 2018-08-01 NOTE — Progress Notes (Signed)
VIALS EXP 08-02-2019 

## 2018-08-02 DIAGNOSIS — J301 Allergic rhinitis due to pollen: Secondary | ICD-10-CM | POA: Diagnosis not present

## 2018-08-06 DIAGNOSIS — J3089 Other allergic rhinitis: Secondary | ICD-10-CM | POA: Diagnosis not present

## 2018-08-07 DIAGNOSIS — L2089 Other atopic dermatitis: Secondary | ICD-10-CM | POA: Diagnosis not present

## 2018-08-07 DIAGNOSIS — L810 Postinflammatory hyperpigmentation: Secondary | ICD-10-CM | POA: Diagnosis not present

## 2018-08-09 ENCOUNTER — Other Ambulatory Visit: Payer: Self-pay

## 2018-08-09 ENCOUNTER — Ambulatory Visit (INDEPENDENT_AMBULATORY_CARE_PROVIDER_SITE_OTHER): Payer: Medicare Other

## 2018-08-09 DIAGNOSIS — J309 Allergic rhinitis, unspecified: Secondary | ICD-10-CM

## 2018-08-09 NOTE — Progress Notes (Signed)
Immunotherapy   Patient Details  Name: Tiffany Velasquez MRN: [id number] Date of Birth: November 28, 1995  08/09/2018  [name] restarted allergy injections. Patient received 0.05 of both her blue vials. One with Grass-Weed-Tree and the other with Mold-DM-Cat-Dog-CR. Patient waited 30 minutes in an exam room with no problems. Following schedule:  A Frequency: Once weekly Epi-Pen: Yes Consent signed and patient instructions given.   Dub Mikes 08/09/2018, 12:11 PM

## 2018-08-14 DIAGNOSIS — H3581 Retinal edema: Secondary | ICD-10-CM | POA: Diagnosis not present

## 2018-08-14 DIAGNOSIS — H40043 Steroid responder, bilateral: Secondary | ICD-10-CM | POA: Diagnosis not present

## 2018-08-14 DIAGNOSIS — Z9889 Other specified postprocedural states: Secondary | ICD-10-CM | POA: Diagnosis not present

## 2018-08-14 DIAGNOSIS — H30033 Focal chorioretinal inflammation, peripheral, bilateral: Secondary | ICD-10-CM | POA: Diagnosis not present

## 2018-08-14 DIAGNOSIS — Z79899 Other long term (current) drug therapy: Secondary | ICD-10-CM | POA: Diagnosis not present

## 2018-08-14 DIAGNOSIS — H44113 Panuveitis, bilateral: Secondary | ICD-10-CM | POA: Diagnosis not present

## 2018-08-16 ENCOUNTER — Ambulatory Visit (INDEPENDENT_AMBULATORY_CARE_PROVIDER_SITE_OTHER): Payer: Medicare Other | Admitting: *Deleted

## 2018-08-16 DIAGNOSIS — J309 Allergic rhinitis, unspecified: Secondary | ICD-10-CM | POA: Diagnosis not present

## 2018-08-23 ENCOUNTER — Ambulatory Visit (INDEPENDENT_AMBULATORY_CARE_PROVIDER_SITE_OTHER): Payer: Medicare Other

## 2018-08-23 DIAGNOSIS — J309 Allergic rhinitis, unspecified: Secondary | ICD-10-CM

## 2018-08-31 ENCOUNTER — Ambulatory Visit (INDEPENDENT_AMBULATORY_CARE_PROVIDER_SITE_OTHER): Payer: Medicare Other

## 2018-08-31 DIAGNOSIS — J309 Allergic rhinitis, unspecified: Secondary | ICD-10-CM | POA: Diagnosis not present

## 2018-09-07 ENCOUNTER — Ambulatory Visit (INDEPENDENT_AMBULATORY_CARE_PROVIDER_SITE_OTHER): Payer: Medicare Other | Admitting: *Deleted

## 2018-09-07 DIAGNOSIS — J309 Allergic rhinitis, unspecified: Secondary | ICD-10-CM | POA: Diagnosis not present

## 2018-09-14 ENCOUNTER — Ambulatory Visit (INDEPENDENT_AMBULATORY_CARE_PROVIDER_SITE_OTHER): Payer: Medicare Other | Admitting: *Deleted

## 2018-09-14 DIAGNOSIS — J309 Allergic rhinitis, unspecified: Secondary | ICD-10-CM

## 2018-09-20 ENCOUNTER — Ambulatory Visit (INDEPENDENT_AMBULATORY_CARE_PROVIDER_SITE_OTHER): Payer: Medicare Other | Admitting: *Deleted

## 2018-09-20 DIAGNOSIS — J309 Allergic rhinitis, unspecified: Secondary | ICD-10-CM | POA: Diagnosis not present

## 2018-10-01 ENCOUNTER — Ambulatory Visit (INDEPENDENT_AMBULATORY_CARE_PROVIDER_SITE_OTHER): Payer: Medicare Other

## 2018-10-01 DIAGNOSIS — J309 Allergic rhinitis, unspecified: Secondary | ICD-10-CM

## 2018-10-09 DIAGNOSIS — Z79899 Other long term (current) drug therapy: Secondary | ICD-10-CM | POA: Diagnosis not present

## 2018-10-09 DIAGNOSIS — H3581 Retinal edema: Secondary | ICD-10-CM | POA: Diagnosis not present

## 2018-10-09 DIAGNOSIS — H30033 Focal chorioretinal inflammation, peripheral, bilateral: Secondary | ICD-10-CM | POA: Diagnosis not present

## 2018-10-09 DIAGNOSIS — H44113 Panuveitis, bilateral: Secondary | ICD-10-CM | POA: Diagnosis not present

## 2018-10-09 DIAGNOSIS — H40043 Steroid responder, bilateral: Secondary | ICD-10-CM | POA: Diagnosis not present

## 2018-10-17 ENCOUNTER — Ambulatory Visit (INDEPENDENT_AMBULATORY_CARE_PROVIDER_SITE_OTHER): Payer: Medicare Other | Admitting: *Deleted

## 2018-10-17 DIAGNOSIS — J309 Allergic rhinitis, unspecified: Secondary | ICD-10-CM

## 2018-10-24 ENCOUNTER — Ambulatory Visit (INDEPENDENT_AMBULATORY_CARE_PROVIDER_SITE_OTHER): Payer: Medicare Other

## 2018-10-24 DIAGNOSIS — J309 Allergic rhinitis, unspecified: Secondary | ICD-10-CM

## 2018-11-08 ENCOUNTER — Ambulatory Visit (INDEPENDENT_AMBULATORY_CARE_PROVIDER_SITE_OTHER): Payer: Medicare Other | Admitting: *Deleted

## 2018-11-08 DIAGNOSIS — J309 Allergic rhinitis, unspecified: Secondary | ICD-10-CM | POA: Diagnosis not present

## 2018-11-16 ENCOUNTER — Ambulatory Visit (INDEPENDENT_AMBULATORY_CARE_PROVIDER_SITE_OTHER): Payer: Medicare Other

## 2018-11-16 DIAGNOSIS — J309 Allergic rhinitis, unspecified: Secondary | ICD-10-CM

## 2018-11-27 ENCOUNTER — Ambulatory Visit (INDEPENDENT_AMBULATORY_CARE_PROVIDER_SITE_OTHER): Payer: Medicare Other | Admitting: Allergy & Immunology

## 2018-11-27 ENCOUNTER — Encounter: Payer: Self-pay | Admitting: Allergy & Immunology

## 2018-11-27 ENCOUNTER — Other Ambulatory Visit: Payer: Self-pay

## 2018-11-27 DIAGNOSIS — T7800XD Anaphylactic reaction due to unspecified food, subsequent encounter: Secondary | ICD-10-CM

## 2018-11-27 DIAGNOSIS — J4540 Moderate persistent asthma, uncomplicated: Secondary | ICD-10-CM

## 2018-11-27 DIAGNOSIS — L2084 Intrinsic (allergic) eczema: Secondary | ICD-10-CM

## 2018-11-27 DIAGNOSIS — J3089 Other allergic rhinitis: Secondary | ICD-10-CM | POA: Diagnosis not present

## 2018-11-27 DIAGNOSIS — J302 Other seasonal allergic rhinitis: Secondary | ICD-10-CM

## 2018-11-27 MED ORDER — BUDESONIDE-FORMOTEROL FUMARATE 160-4.5 MCG/ACT IN AERO: 2.0000 | INHALATION_SPRAY | Freq: Two times a day (BID) | RESPIRATORY_TRACT | 5 refills | Status: DC

## 2018-11-27 NOTE — Patient Instructions (Addendum)
1. Moderate persistent asthma, uncomplicated - We need to get a breathing test at the next visit. - Give your history, it is important for you to take the Symbicort on a daily basis.  - Try to take it at least two puffs once daily at the least.  - Daily controller medication(s): Symbicort 160/4.5 two puffs 1-2 daily with spacer - Rescue medications: ProAir 4 puffs every 4-6 hours as needed - Changes during respiratory infections or worsening symptoms: add Flovent 153mcg to 2 puffs twice daily for ONE TO TWO WEEKS - Asthma control goals:  * Full participation in all desired activities (may need albuterol before activity) * Albuterol use two time or less a week on average (not counting use with activity) * Cough interfering with sleep two time or less a month * Oral steroids no more than once a year * No hospitalizations  2. Chronic allergic rhinitis  - Resume allergy shots at the same schedule.  - Continue with Singulair, cetirizine, and Astelin.   3. Atopic dermatitis - Continue with moisturizing twice daily. - Continue with [employer].   - Continue with your medicated ointments as needed.  - Let us know if you need refills and we are happy to send those in.   4. Multiple food allergies (peanuts, shellfish, multiple fruits/vegetables) - EpiPen is up to date.  - Continue to avoid peanuts, tree nuts, and shellfish.   5. Return in about 3 months (around 02/26/2019). This can be an in-person follow up visit.   Please inform us of any Emergency Department visits, hospitalizations, or changes in symptoms. Call us before going to the ED for breathing or allergy symptoms since we might be able to fit you in for a sick visit. Feel free to contact us anytime with any questions, problems, or concerns.  It was a pleasure to see you and your family again today!  Websites that have reliable patient information: 1. American Academy of Asthma, Allergy, and Immunology: www.aaaai.org 2.  Food Allergy Research and Education (FARE): foodallergy.org 3. Mothers of Asthmatics: http://www.asthmacommunitynetwork.org 4. American College of Allergy, Asthma, and Immunology: www.acaai.org  "Like" [place] on Facebook and Instagram for our latest updates!      Make sure you are registered to vote! If you have moved or changed any of your contact information, you will need to get this updated before voting!  In some cases, you MAY be able to register to vote online: [email or web address]    Voter ID laws are NOT going into effect for the General Election in November 2020! DO NOT let this stop you from exercising your right to vote!   Absentee voting is the SAFEST way to vote during the coronavirus pandemic!   Download and print an absentee ballot request form at [email or web address] or you can scan the QR code below with your smart phone:      More information on absentee ballots can be found here: [email or web address]  [place] VOTING SITES have been established! You can register to vote and cast your vote on the same day at these locations. See this site for more information on what you need to register to vote: [email or web address]

## 2018-11-27 NOTE — Progress Notes (Signed)
RE: Tiffany Velasquez MRN: [id number] DOB: 03/12/1996 Date of Telemedicine Visit: 11/27/2018  Referring provider: Tillman Sers, DO Primary care provider: Katha Cabal, MD  Chief Complaint: Asthma (coughing at night) and Food Intolerance   Telemedicine Follow Up Visit via Telephone: I connected with Tiffany Velasquez for a follow up on 11/27/18 by telephone and verified that I am speaking with the correct person using two identifiers.   I discussed the limitations, risks, security and privacy concerns of performing an evaluation and management service by telephone and the availability of in person appointments. I also discussed with the patient that there may be a patient responsible charge related to this service. The patient expressed understanding and agreed to proceed.  Patient is at home accompanied by her mother who provided/contributed to the history.  Provider is at the office.  Visit start time: 10:59 AM Visit end time: 11:20 AM Insurance consent/check in [facility] and medical assistant/nurse: Darreld Mclean  History of Present Illness:  She is a 23 y.o. female, who is being followed for a multitude of atopic problems. Her previous allergy office visit was in April 2020 with myself. At that time, we continued her on Symbicort 160/4.37mcg two puffs BID as well as Flovent added during respiratory flares. She did make the decision to start allergen immunotherapy and continued her on Singulair, cetirizine, and Astelin. Atopic dermatitis was controlled with moisturizing twice daily as well as Dove sensitive soap and her medicated ointments.  She continued to be followed by Dr. Arta Bruce.  She has a history of multiple food allergies as well and avoids peanuts, tree nuts, shellfish, and multiple fruits and vegetables. 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   Since the last, she has done well. She missed  her appointment this morning because she overslept, but we called her and transitioned the visit to a telephone visit instead.   She has been trying to get back into school. She was majoring in Lobbyist. She estimates that she has around 18 months longer. She is going to [employer]. She is hopeful that she will be able to start school again this semester.   Asthma/Respiratory Symptom History: She is using the Symbicort but she has decreased her dosing since it had "calmed down". She is needing the Symbicort a few times weekly, although this was supposed to be given two puffs twice daily. ACT is 15, indicating subpar asthma control. She has not been to the ED or been admitted to the hospital for her asthma at all.   Allergic Rhinitis Symptom History: She remains on the allergy shots. Typically she comes in on Thursdays. She typically gets her Humira every two weeks on Wednesdays. She is using cetirizine daily. She has two more refills of that. She is no longer on montelukast. She has not needed antibiotics for sinus infections. She feels that the allergy shots are going well. She is planning to come in this week for her injection.   Food Allergy Symptom History: She continues to avoid all of her triggering foods. She does not need an EpiPen refill. She is not interested in doing any retesting at all today. She has had no accidental ingestions whatsoever.    Eczema Symptom Symptom History: She continues to see Dr. Leonie Man for Dermatology. She recently saw her last week and was told to follow up as needed. She has not needed any antibiotics for  Staphylococcal skin infections at all.    Her pressures are still down. She is doing to have cataracts taken out of the left eye first and the right eye next. She does have some vision loss on the left side. She does note that if she covers her right eye, she has blurriness of the left eye. She has an appointment on 12/07/2018 and has a surgery in October. She  is improving from a vision perspective. She remains on the azathioprine four times daily. She is off of most of her eye drops now.   Otherwise, there have been no changes to her past medical history, surgical history, family history, or social history.  Assessment and Plan:  Nekeya is a 23 y.o. 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 Moderate persistent asthma, uncomplicated - We need to get a breathing test at the next visit. - Give your history, it is important for you to take the Symbicort on a daily basis.  - Try to take it at least two puffs once daily at the least.  - Daily controller medication(s): Symbicort 160/4.5 two puffs 1-2 daily with spacer - Rescue medications: ProAir 4 puffs every 4-6 hours as needed - Changes during respiratory infections or worsening symptoms: add Flovent 150mcg to 2 puffs twice daily for ONE TO TWO WEEKS - Asthma control goals:  * Full participation in all desired activities (may need albuterol before activity) * Albuterol use two time or less a week on average (not counting use with activity) * Cough interfering with sleep two time or less a month * Oral steroids no more than once a year * No hospitalizations  2. Chronic allergic rhinitis  - Resume allergy shots at the same schedule.  - Continue with Singulair, cetirizine, and Astelin.   3. Atopic dermatitis - Continue with moisturizing twice daily. - Continue with [employer].   - Continue with your medicated ointments as needed.  - Let us know if you need refills and we are happy to send those in.   4. Multiple food allergies (peanuts, shellfish, multiple  fruits/vegetables) - EpiPen is up to date.  - Continue to avoid peanuts, tree nuts, and shellfish.   5. Return in about 3 months (around 02/26/2019). This can be an in-person follow up visit.   Diagnostics: None.  Medication List:  Current Outpatient Medications  Medication Sig Dispense Refill   albuterol (PROAIR HFA) 108 (90 Base) MCG/ACT inhaler Inhale 2 puffs into the lungs every 6 (six) hours as needed for wheezing or shortness of breath. 1 Inhaler 1   cetirizine (ZYRTEC) 10 MG tablet Take 1 tablet (10 mg total) by mouth daily. 30 tablet 5   ciclopirox (LOPROX) 0.77 % cream APPLY 1 APPLICATION TO ACTIVE RASH ON BODY BID PRN  1   clindamycin (CLEOCIN T) 1 % lotion APPLY APPLICATION ON THE FACE D QAM  3   clobetasol ointment (TEMOVATE) 0.05 % APPLY ON THE SKIN BID FOR ACTIVE ECZEMA  0   desonide (DESOWEN) 0.05 % ointment APP APPLICATION ON FACE BID AS DIRECTED  0   fluocinonide cream (LIDEX) 0.05 %   0   fluticasone (FLOVENT HFA) 110 MCG/ACT inhaler Inhale 2 puffs into the lungs 2 (two) times  daily. 1 Inhaler 5   halobetasol (ULTRAVATE) 0.05 % ointment APPLY TO RASH ON BODY BID PRF FLARES UTD  2   hydrOXYzine (ATARAX/VISTARIL) 10 MG tablet Take 1 tablet (10 mg total) by mouth daily as needed. 30 tablet 5   ketoconazole (NIZORAL) 2 % cream APPLY 1 APPLICATION TO RASH ON FACE BID PRN  1   montelukast (SINGULAIR) 10 MG tablet Take 1 tablet (10 mg total) by mouth at bedtime. 30 tablet 5   naproxen (NAPROSYN) 375 MG tablet Take 1 tablet (375 mg total) by mouth 2 (two) times daily with a meal. 20 tablet 0   polyethylene glycol powder (MIRALAX) powder Take 17 g by mouth daily. 255 g 5   tacrolimus (PROTOPIC) 0.1 % ointment APPLY TO RASH ON BODY BID  1   budesonide-formoterol (SYMBICORT) 160-4.5 MCG/ACT inhaler Inhale 2 puffs into the lungs 2 (two) times daily. 1 Inhaler 5   No current facility-administered medications for this visit.    Allergies: Allergies  Allergen  Reactions   Peanut-Containing Drug Products Swelling    Throat swelling   Shellfish Allergy Swelling    Throat swelling    Apple    Orange Fruit [Citrus]    Peach Flavor    Tomato    Tree Extract     ALLERGIC TO TREE NUTS   I reviewed her past medical history, social history, family history, and environmental history and no significant changes have been reported from previous visits.  Review of Systems  Constitutional: Negative for activity change, appetite change, chills and fatigue.  HENT: Negative for congestion, postnasal drip, rhinorrhea, sinus pressure and sore throat.   Eyes: Negative for pain, discharge, redness and itching.  Respiratory: Negative for shortness of breath, wheezing and stridor.   Gastrointestinal: Negative for diarrhea, nausea and vomiting.  Endocrine: Negative for cold intolerance and heat intolerance.  Musculoskeletal: Negative for arthralgias, joint swelling and myalgias.  Skin: Negative for rash.  Allergic/Immunologic: Positive for environmental allergies and immunocompromised state.    Objective:  Physical exam not obtained as encounter was done via telephone.   Previous notes and tests were reviewed.  I discussed the assessment and treatment plan with the patient. The patient was provided an opportunity to ask questions and all were answered. The patient agreed with the plan and demonstrated an understanding of the instructions.   The patient was advised to call back or seek an in-person evaluation if the symptoms worsen or if the condition fails to improve as anticipated.  I provided 21 minutes of non-face-to-face time during this encounter.  It was my pleasure to participate in TolarKeyanna Adebayo's care today. Please feel free to contact me with any questions or concerns.   Sincerely,  Alfonse SpruceJoel Louis Airis Barbee, MD

## 2018-11-30 ENCOUNTER — Ambulatory Visit (INDEPENDENT_AMBULATORY_CARE_PROVIDER_SITE_OTHER): Payer: Medicare Other | Admitting: *Deleted

## 2018-11-30 DIAGNOSIS — J309 Allergic rhinitis, unspecified: Secondary | ICD-10-CM | POA: Diagnosis not present

## 2018-12-04 ENCOUNTER — Ambulatory Visit (INDEPENDENT_AMBULATORY_CARE_PROVIDER_SITE_OTHER): Payer: Medicare Other

## 2018-12-04 DIAGNOSIS — J309 Allergic rhinitis, unspecified: Secondary | ICD-10-CM | POA: Diagnosis not present

## 2018-12-11 DIAGNOSIS — Z23 Encounter for immunization: Secondary | ICD-10-CM | POA: Diagnosis not present

## 2018-12-12 ENCOUNTER — Ambulatory Visit: Payer: Medicare Other

## 2018-12-12 ENCOUNTER — Ambulatory Visit (INDEPENDENT_AMBULATORY_CARE_PROVIDER_SITE_OTHER): Payer: Medicare Other | Admitting: *Deleted

## 2018-12-12 DIAGNOSIS — H268 Other specified cataract: Secondary | ICD-10-CM | POA: Diagnosis not present

## 2018-12-12 DIAGNOSIS — H3581 Retinal edema: Secondary | ICD-10-CM | POA: Diagnosis not present

## 2018-12-12 DIAGNOSIS — H30033 Focal chorioretinal inflammation, peripheral, bilateral: Secondary | ICD-10-CM | POA: Diagnosis not present

## 2018-12-12 DIAGNOSIS — H40003 Preglaucoma, unspecified, bilateral: Secondary | ICD-10-CM | POA: Diagnosis not present

## 2018-12-12 DIAGNOSIS — J309 Allergic rhinitis, unspecified: Secondary | ICD-10-CM | POA: Diagnosis not present

## 2018-12-12 DIAGNOSIS — Z79899 Other long term (current) drug therapy: Secondary | ICD-10-CM | POA: Diagnosis not present

## 2018-12-12 DIAGNOSIS — H21543 Posterior synechiae (iris), bilateral: Secondary | ICD-10-CM | POA: Diagnosis not present

## 2018-12-12 DIAGNOSIS — H40043 Steroid responder, bilateral: Secondary | ICD-10-CM | POA: Diagnosis not present

## 2018-12-19 ENCOUNTER — Ambulatory Visit (INDEPENDENT_AMBULATORY_CARE_PROVIDER_SITE_OTHER): Payer: Medicare Other | Admitting: *Deleted

## 2018-12-19 DIAGNOSIS — J309 Allergic rhinitis, unspecified: Secondary | ICD-10-CM

## 2018-12-28 ENCOUNTER — Ambulatory Visit (INDEPENDENT_AMBULATORY_CARE_PROVIDER_SITE_OTHER): Payer: Medicare Other | Admitting: *Deleted

## 2018-12-28 DIAGNOSIS — J309 Allergic rhinitis, unspecified: Secondary | ICD-10-CM

## 2018-12-31 ENCOUNTER — Other Ambulatory Visit: Payer: Self-pay

## 2018-12-31 DIAGNOSIS — Z20822 Contact with and (suspected) exposure to covid-19: Secondary | ICD-10-CM

## 2019-01-01 DIAGNOSIS — H3581 Retinal edema: Secondary | ICD-10-CM | POA: Diagnosis not present

## 2019-01-01 DIAGNOSIS — H40043 Steroid responder, bilateral: Secondary | ICD-10-CM | POA: Diagnosis not present

## 2019-01-01 DIAGNOSIS — H44113 Panuveitis, bilateral: Secondary | ICD-10-CM | POA: Diagnosis not present

## 2019-01-01 DIAGNOSIS — H268 Other specified cataract: Secondary | ICD-10-CM | POA: Diagnosis not present

## 2019-01-01 DIAGNOSIS — Z79899 Other long term (current) drug therapy: Secondary | ICD-10-CM | POA: Diagnosis not present

## 2019-01-01 DIAGNOSIS — H30033 Focal chorioretinal inflammation, peripheral, bilateral: Secondary | ICD-10-CM | POA: Diagnosis not present

## 2019-01-02 LAB — NOVEL CORONAVIRUS, NAA: SARS-CoV-2, NAA: NOT DETECTED

## 2019-01-03 ENCOUNTER — Ambulatory Visit (INDEPENDENT_AMBULATORY_CARE_PROVIDER_SITE_OTHER): Payer: Medicare Other | Admitting: *Deleted

## 2019-01-03 DIAGNOSIS — J309 Allergic rhinitis, unspecified: Secondary | ICD-10-CM | POA: Diagnosis not present

## 2019-01-08 ENCOUNTER — Other Ambulatory Visit: Payer: Self-pay

## 2019-01-08 MED ORDER — AZELASTINE HCL 0.1 % NA SOLN: NASAL | 5 refills | Status: DC

## 2019-01-10 ENCOUNTER — Ambulatory Visit (INDEPENDENT_AMBULATORY_CARE_PROVIDER_SITE_OTHER): Payer: Medicare Other

## 2019-01-10 DIAGNOSIS — J309 Allergic rhinitis, unspecified: Secondary | ICD-10-CM | POA: Diagnosis not present

## 2019-01-11 DIAGNOSIS — L853 Xerosis cutis: Secondary | ICD-10-CM | POA: Diagnosis not present

## 2019-01-11 DIAGNOSIS — L700 Acne vulgaris: Secondary | ICD-10-CM | POA: Diagnosis not present

## 2019-01-15 ENCOUNTER — Ambulatory Visit (INDEPENDENT_AMBULATORY_CARE_PROVIDER_SITE_OTHER): Payer: Medicare Other

## 2019-01-15 DIAGNOSIS — J309 Allergic rhinitis, unspecified: Secondary | ICD-10-CM

## 2019-01-28 ENCOUNTER — Ambulatory Visit (INDEPENDENT_AMBULATORY_CARE_PROVIDER_SITE_OTHER): Payer: Medicare Other

## 2019-01-28 DIAGNOSIS — J309 Allergic rhinitis, unspecified: Secondary | ICD-10-CM | POA: Diagnosis not present

## 2019-02-01 DIAGNOSIS — H268 Other specified cataract: Secondary | ICD-10-CM | POA: Diagnosis not present

## 2019-02-08 ENCOUNTER — Ambulatory Visit (INDEPENDENT_AMBULATORY_CARE_PROVIDER_SITE_OTHER): Payer: Medicare Other | Admitting: *Deleted

## 2019-02-08 DIAGNOSIS — J309 Allergic rhinitis, unspecified: Secondary | ICD-10-CM

## 2019-02-12 DIAGNOSIS — H268 Other specified cataract: Secondary | ICD-10-CM | POA: Diagnosis not present

## 2019-02-12 DIAGNOSIS — H2589 Other age-related cataract: Secondary | ICD-10-CM | POA: Diagnosis not present

## 2019-02-12 DIAGNOSIS — H25811 Combined forms of age-related cataract, right eye: Secondary | ICD-10-CM | POA: Diagnosis not present

## 2019-02-14 ENCOUNTER — Ambulatory Visit (INDEPENDENT_AMBULATORY_CARE_PROVIDER_SITE_OTHER): Payer: Medicare Other

## 2019-02-14 DIAGNOSIS — J309 Allergic rhinitis, unspecified: Secondary | ICD-10-CM

## 2019-02-20 ENCOUNTER — Ambulatory Visit (INDEPENDENT_AMBULATORY_CARE_PROVIDER_SITE_OTHER): Payer: Medicare Other | Admitting: *Deleted

## 2019-02-20 DIAGNOSIS — J309 Allergic rhinitis, unspecified: Secondary | ICD-10-CM

## 2019-02-28 ENCOUNTER — Other Ambulatory Visit: Payer: Self-pay

## 2019-02-28 ENCOUNTER — Encounter: Payer: Self-pay | Admitting: Allergy & Immunology

## 2019-02-28 ENCOUNTER — Ambulatory Visit (INDEPENDENT_AMBULATORY_CARE_PROVIDER_SITE_OTHER): Payer: Medicare Other | Admitting: Allergy & Immunology

## 2019-02-28 VITALS — BP 138/84 | HR 103 | Temp 98.0°F | Resp 20 | Ht 70.0 in | Wt >= 6400 oz

## 2019-02-28 DIAGNOSIS — J3089 Other allergic rhinitis: Secondary | ICD-10-CM | POA: Diagnosis not present

## 2019-02-28 DIAGNOSIS — T7800XD Anaphylactic reaction due to unspecified food, subsequent encounter: Secondary | ICD-10-CM | POA: Diagnosis not present

## 2019-02-28 DIAGNOSIS — J4540 Moderate persistent asthma, uncomplicated: Secondary | ICD-10-CM | POA: Diagnosis not present

## 2019-02-28 DIAGNOSIS — L309 Dermatitis, unspecified: Secondary | ICD-10-CM

## 2019-02-28 DIAGNOSIS — J302 Other seasonal allergic rhinitis: Secondary | ICD-10-CM

## 2019-02-28 MED ORDER — AZELASTINE HCL 0.1 % NA SOLN: NASAL | 5 refills | Status: DC

## 2019-02-28 MED ORDER — CETIRIZINE HCL 10 MG PO TABS: 10.0000 mg | ORAL_TABLET | Freq: Every day | ORAL | 5 refills | Status: DC

## 2019-02-28 MED ORDER — MONTELUKAST SODIUM 10 MG PO TABS: 10.0000 mg | ORAL_TABLET | Freq: Every day | ORAL | 5 refills | Status: DC

## 2019-02-28 NOTE — Patient Instructions (Addendum)
1. Moderate persistent asthma, uncomplicated - Lung testing looked amazing today.  - Daily controller medication(s): Symbicort 160/4.5 two puffs twice daily with spacer - Rescue medications: ProAir 4 puffs every 4-6 hours as needed - Changes during respiratory infections or worsening symptoms: add Flovent 171mcg to 2 puffs twice daily for ONE TO TWO WEEKS - Asthma control goals:  * Full participation in all desired activities (may need albuterol before activity) * Albuterol use two time or less a week on average (not counting use with activity) * Cough interfering with sleep two time or less a month * Oral steroids no more than once a year * No hospitalizations  2. Chronic allergic rhinitis  - Continue with allergy shots at the same schedule.  - Continue with Singulair, cetirizine, and Astelin.  - Trying increasing the cetirizine to twice daily to see if this helps with your nasal symptoms. - If this is not helping, start the prednisone pack provided.   3. Atopic dermatitis - Continue with moisturizing twice daily. - Continue with [employer].   - Continue with your medicated ointments as needed.   4. Multiple food allergies (peanuts, shellfish, multiple fruits/vegetables) - EpiPen is up to date.  - Continue to avoid peanuts, tree nuts, and shellfish.    5. Return in about 6 months (around 08/29/2019). This can be an in-person, a virtual Webex or a telephone follow up visit.   Please inform us of any Emergency Department visits, hospitalizations, or changes in symptoms. Call us before going to the ED for breathing or allergy symptoms since we might be able to fit you in for a sick visit. Feel free to contact us anytime with any questions, problems, or concerns.  It was a pleasure to see you again today!  Websites that have reliable patient information: 1. American Academy of Asthma, Allergy, and Immunology: www.aaaai.org 2. Food Allergy Research and Education (FARE):  foodallergy.org 3. Mothers of Asthmatics: http://www.asthmacommunitynetwork.org 4. American College of Allergy, Asthma, and Immunology: www.acaai.org  "Like" [place] on Facebook and Instagram for our latest updates!      Make sure you are registered to vote! If you have moved or changed any of your contact information, you will need to get this updated before voting!  In some cases, you MAY be able to register to vote online: [email or web address]

## 2019-02-28 NOTE — Progress Notes (Signed)
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 Moderate persistent asthma, uncomplicated - Lung testing looked amazing today.  - Daily controller medication(s): Symbicort 160/4.5 two puffs twice daily with spacer - Rescue medications: ProAir 4 puffs every 4-6 hours as needed - Changes during respiratory infections or worsening symptoms: add Flovent 110mcg to 2 puffs twice daily for ONE TO TWO WEEKS - Asthma control goals:  * Full participation in all desired activities (may need albuterol before activity) * Albuterol use two time or less a week on average (not counting use with activity) * Cough interfering with sleep two time or less a month * Oral steroids no more than once a year * No hospitalizations  2. Chronic allergic rhinitis  - Continue with allergy shots at the same schedule.  - Continue with Singulair, cetirizine, and Astelin.  - Trying increasing the cetirizine to twice daily to see if this helps with your nasal symptoms. - If this is not helping, start the prednisone pack provided.   3. Atopic dermatitis - Continue with moisturizing twice daily. - Continue with [employer] Sensitive soap.   - Continue with your medicated ointments as needed.   4. Multiple food allergies (peanuts, shellfish, multiple fruits/vegetables) - EpiPen is up to date.  - Continue to avoid peanuts, tree nuts, and shellfish.    5. Return in about 6 months (around 08/29/2019). This can be an in-person, a virtual Webex or a telephone follow up visit.    Subjective:   Tiffany Velasquez is a 23 y.o. female presenting today for follow up of  Chief Complaint  Patient presents with  . Allergies    Stuffy nose    Tiffany MainlandKeyanna H Werk has a history of the following: Patient Active Problem List   Diagnosis Date Noted  . Iritis 07/26/2018  . Seasonal and perennial allergic rhinitis 07/26/2018  . Acute upper respiratory infection 06/28/2017  . Mild persistent asthma, uncomplicated 06/15/2017  . Acute atopic conjunctivitis of left eye 06/15/2017  . Adverse food reaction 06/15/2017  . Moderate persistent asthma without complication 01/16/2017  . Intrinsic atopic dermatitis 01/16/2017  . Low back pain 12/22/2015  . Allergic rhinoconjunctivitis 12/06/2014  . Food allergy 12/06/2014  . Chalazion of right upper eyelid 09/18/2014  . Healthcare maintenance 04/12/2014  . Asthma, chronic 06/22/2012  . Elevated blood pressure reading without diagnosis of hypertension 06/22/2012  . Family history of sudden cardiac death 08/04/2011  . Morbid obesity (HCC) 10/05/2009  . HALLUCINATIONS 10/05/2009  . CONSTIPATION, CHRONIC 04/16/2008  . ENCOPRESIS 05/25/2006  . ATTENTION DEFICIT, W/HYPERACTIVITY 05/25/2006  . ALLERGIC CONJUNCTIVITIS 05/25/2006  . Allergic rhinitis 05/25/2006  . Severe eczema 05/25/2006    History obtained from: chart review and patient.  Ophthalmologist: Dr. Gae Bonajiv Shah Dermatologist: Dr. Lavone NianSusan Stinehelfer  Velasquez is a 23 y.o. female presenting for a follow up visit.  She was last seen in September 2020.  At that time, we had a telephone visit.  She was not using her Symbicort on a regular basis, which I felt was risky given her history of multiple hospitalizations and ER visits.  We recommended Symbicort 2 puffs  twice daily every day as well as Flovent added during respiratory flares.  Since the last visit, she has done well. She did have cataract surgery around three weeks ago. She had to have her "pupil popped out", too. She remains  on her Humira and azathioprine for her iritis. She continues to follow with Dr. Dwana Melena.   From an atopic's perspective, she is doing very well.  Asthma/Respiratory Symptom History: She remains on her Symbicort 2 puffs in the morning and 2 puffs at night.  She does report compliance with this regimen.  She does use a spacer.  She has not been needing her rescue inhaler at all.  ACT score is 25, indicating excellent asthma control.  She has not required any hospitalizations or ER visits for her symptoms.  She has not required any prednisone burst.  Allergic Rhinitis Symptom History: She remains on her allergen immunotherapy.  Shots are going well.  She gets a shot at least once a week.  She does report some increased mucus production over the past couple of days, but otherwise she has been well controlled.  She is on cetirizine as well as montelukast and her Astelin nasal spray.  We have not had her on a nasal steroid in a while because of her cataracts.  She recently started her Allena Katz of each of her injections last week.  Food Allergy Symptom History: She continues to avoid peanuts, tree nuts, and shellfish.  Her EpiPen is up-to-date.  She has had no accidental ingestions.  Eczema Symptom History: She did see Dr. Wray Kearns for a rash on her back around five weeks ago. She is going to restart the steroid injections in her head for hair regrowth. Alopecia has been an issue for her since she was very young. She did not start the injections until the junior year of high school.   Otherwise, there have been no changes to her past medical history, surgical history, family history, or social history.    Review of Systems  Constitutional: Negative.  Negative for chills, fever, malaise/fatigue and weight loss.  HENT: Negative.  Negative for congestion, ear discharge and ear pain.   Eyes: Negative for pain, discharge and redness.       Positive for recent cataract surgery.  Respiratory: Negative  for cough, sputum production, shortness of breath and wheezing.   Cardiovascular: Negative for chest pain and palpitations.  Gastrointestinal: Negative for abdominal pain, diarrhea, heartburn, nausea and vomiting.  Skin: Negative.  Negative for itching and rash.  Neurological: Negative for dizziness and headaches.  Endo/Heme/Allergies: Negative for environmental allergies. Does not bruise/bleed easily.       Objective:   Blood pressure 138/84, pulse (!) 103, temperature 98 F (36.7 C), temperature source Temporal, resp. rate 20, height 5\' 10"  (1.778 m), weight (!) 427 lb 12.8 oz (194 kg), SpO2 95 %. Body mass index is 61.38 kg/m.   Physical Exam:  Physical Exam  Constitutional: She appears well-developed.  Talkative obese female.  HENT:  Head: Normocephalic and atraumatic.  Right Ear: Tympanic membrane, external ear and ear canal normal.  Left Ear: Tympanic membrane, external ear and ear canal normal.  Nose: Mucosal edema and rhinorrhea present. No nasal deformity or septal deviation. No epistaxis. Right sinus exhibits no maxillary sinus tenderness and no frontal sinus tenderness. Left sinus exhibits no maxillary sinus tenderness and no frontal sinus tenderness.  Mouth/Throat: Uvula is midline and oropharynx is clear and moist. Mucous membranes are not pale and not dry.  Tonsils normal bilaterally.  There is some cobblestoning in the posterior oropharynx.  Eyes: Pupils are equal, round, and reactive to light. Conjunctivae and EOM are normal. Right eye exhibits no chemosis and no discharge. Left eye exhibits no chemosis and no discharge. Right conjunctiva is not injected. Left conjunctiva is not injected.  Cardiovascular: Normal rate, regular rhythm and normal heart sounds.  Respiratory: Effort normal and breath sounds normal. No accessory muscle usage. No tachypnea. No respiratory distress. She has no wheezes. She has no rhonchi. She has no rales. She exhibits no tenderness.  Moving  air well in all lung fields.  No increased work of breathing.  Lymphadenopathy:    She has no cervical adenopathy.  Neurological: She is alert.  Skin: No abrasion, no petechiae and no rash noted. Rash is not papular, not vesicular and not urticarial. No erythema. No pallor.  No eczematous lesions noted.  She does have some healing scars on her bilateral arms, mostly in her antecubital fossa.  Psychiatric: She has a normal mood and affect.     Diagnostic studies:    Spirometry: results normal (FEV1: 3.17/91%, FVC: 3.79/95%, FEV1/FVC: 84%).    Spirometry consistent with normal pattern.    Allergy Studies: none      Malachi Bonds, MD  [facility]

## 2019-03-04 ENCOUNTER — Ambulatory Visit (INDEPENDENT_AMBULATORY_CARE_PROVIDER_SITE_OTHER): Payer: Medicare Other

## 2019-03-04 DIAGNOSIS — J3089 Other allergic rhinitis: Secondary | ICD-10-CM

## 2019-03-04 DIAGNOSIS — J309 Allergic rhinitis, unspecified: Secondary | ICD-10-CM | POA: Diagnosis not present

## 2019-03-15 ENCOUNTER — Ambulatory Visit (INDEPENDENT_AMBULATORY_CARE_PROVIDER_SITE_OTHER): Payer: Medicare Other | Admitting: *Deleted

## 2019-03-15 DIAGNOSIS — J309 Allergic rhinitis, unspecified: Secondary | ICD-10-CM | POA: Diagnosis not present

## 2019-03-19 ENCOUNTER — Ambulatory Visit (INDEPENDENT_AMBULATORY_CARE_PROVIDER_SITE_OTHER): Payer: Medicare Other

## 2019-03-19 DIAGNOSIS — J309 Allergic rhinitis, unspecified: Secondary | ICD-10-CM

## 2019-03-22 ENCOUNTER — Other Ambulatory Visit: Payer: Self-pay

## 2019-03-22 ENCOUNTER — Encounter (HOSPITAL_COMMUNITY): Payer: Self-pay | Admitting: Emergency Medicine

## 2019-03-22 DIAGNOSIS — S0990XA Unspecified injury of head, initial encounter: Secondary | ICD-10-CM | POA: Diagnosis not present

## 2019-03-22 DIAGNOSIS — M546 Pain in thoracic spine: Secondary | ICD-10-CM | POA: Diagnosis not present

## 2019-03-22 DIAGNOSIS — Z91010 Allergy to peanuts: Secondary | ICD-10-CM | POA: Insufficient documentation

## 2019-03-22 DIAGNOSIS — Y9389 Activity, other specified: Secondary | ICD-10-CM | POA: Insufficient documentation

## 2019-03-22 DIAGNOSIS — S39012A Strain of muscle, fascia and tendon of lower back, initial encounter: Secondary | ICD-10-CM | POA: Insufficient documentation

## 2019-03-22 DIAGNOSIS — Z79899 Other long term (current) drug therapy: Secondary | ICD-10-CM | POA: Insufficient documentation

## 2019-03-22 DIAGNOSIS — G44209 Tension-type headache, unspecified, not intractable: Secondary | ICD-10-CM | POA: Insufficient documentation

## 2019-03-22 DIAGNOSIS — S3992XA Unspecified injury of lower back, initial encounter: Secondary | ICD-10-CM | POA: Diagnosis present

## 2019-03-22 DIAGNOSIS — Y92410 Unspecified street and highway as the place of occurrence of the external cause: Secondary | ICD-10-CM | POA: Insufficient documentation

## 2019-03-22 DIAGNOSIS — R519 Headache, unspecified: Secondary | ICD-10-CM | POA: Diagnosis not present

## 2019-03-22 DIAGNOSIS — M545 Low back pain: Secondary | ICD-10-CM | POA: Diagnosis not present

## 2019-03-22 DIAGNOSIS — H5789 Other specified disorders of eye and adnexa: Secondary | ICD-10-CM | POA: Insufficient documentation

## 2019-03-22 DIAGNOSIS — J45909 Unspecified asthma, uncomplicated: Secondary | ICD-10-CM | POA: Insufficient documentation

## 2019-03-22 DIAGNOSIS — Y999 Unspecified external cause status: Secondary | ICD-10-CM | POA: Diagnosis not present

## 2019-03-22 DIAGNOSIS — S299XXA Unspecified injury of thorax, initial encounter: Secondary | ICD-10-CM | POA: Diagnosis not present
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
## 2019-03-23 ENCOUNTER — Emergency Department (HOSPITAL_COMMUNITY)
Admission: EM | Admit: 2019-03-23 | Discharge: 2019-03-23 | Disposition: A | Discharge status: Home / Self Care | Payer: Medicare Other | Attending: Emergency Medicine | Admitting: Emergency Medicine

## 2019-03-23 ENCOUNTER — Emergency Department (HOSPITAL_COMMUNITY): Payer: Medicare Other

## 2019-03-23 DIAGNOSIS — S3992XA Unspecified injury of lower back, initial encounter: Secondary | ICD-10-CM | POA: Diagnosis not present

## 2019-03-23 DIAGNOSIS — S39012A Strain of muscle, fascia and tendon of lower back, initial encounter: Secondary | ICD-10-CM | POA: Diagnosis not present

## 2019-03-23 DIAGNOSIS — R519 Headache, unspecified: Secondary | ICD-10-CM | POA: Diagnosis not present

## 2019-03-23 DIAGNOSIS — S299XXA Unspecified injury of thorax, initial encounter: Secondary | ICD-10-CM | POA: Diagnosis not present

## 2019-03-23 DIAGNOSIS — G44209 Tension-type headache, unspecified, not intractable: Secondary | ICD-10-CM

## 2019-03-23 DIAGNOSIS — S0990XA Unspecified injury of head, initial encounter: Secondary | ICD-10-CM | POA: Diagnosis not present

## 2019-03-23 DIAGNOSIS — M546 Pain in thoracic spine: Secondary | ICD-10-CM | POA: Diagnosis not present

## 2019-03-23 DIAGNOSIS — M545 Low back pain: Secondary | ICD-10-CM | POA: Diagnosis not present

## 2019-03-23 LAB — I-STAT BETA HCG BLOOD, ED (MC, WL, AP ONLY): I-stat hCG, quantitative: <5 m[IU]/mL (ref <5)

## 2019-03-23 MED ORDER — SODIUM CHLORIDE 0.9 % IV BOLUS
1000.0000 mL | Freq: Once | INTRAVENOUS | Status: AC
  Administered 2019-03-23: 1000 mL via INTRAVENOUS

## 2019-03-23 MED ORDER — ACETAMINOPHEN 325 MG PO TABS
650.0000 mg | ORAL_TABLET | Freq: Once | ORAL | Status: AC
  Administered 2019-03-23: 650 mg via ORAL
  Filled 2019-03-23: qty 2

## 2019-03-23 MED ORDER — DIPHENHYDRAMINE HCL 50 MG/ML IJ SOLN
25.0000 mg | Freq: Once | INTRAMUSCULAR | Status: AC
  Administered 2019-03-23: 25 mg via INTRAVENOUS
  Filled 2019-03-23: qty 1

## 2019-03-23 MED ORDER — METOCLOPRAMIDE HCL 5 MG/ML IJ SOLN
10.0000 mg | Freq: Once | INTRAMUSCULAR | Status: AC
  Administered 2019-03-23: 10 mg via INTRAVENOUS
  Filled 2019-03-23: qty 2
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
## 2019-03-23 NOTE — Discharge Instructions (Signed)
Apply ice as needed.  Take acetaminophen as needed.  Follow up with your eye doctor regarding your eye irritation.

## 2019-03-23 NOTE — ED Notes (Addendum)
Call received from pt relative demanding to speak to Charge RN re: pt wait time, pt notified relative that other pts were being called back that arrived after her arrival. Attempted to explain to caller pt has been assessed, rooms currently full, pts seen according to acuity, pt will be called and seen as soon as possible. Caller became irate and made statements such as "Yes you do have rooms available"... "you're lying it does not take this long; she had surgery"... "what is your name". Automotive engineer unable to take call (d/t transfer of call to [employer] RN phone prior to answer) but will take name and number for Charge RN to rtn call when available. Caller states "No I don't need to talk to anyone else". Welcomed rtn call if needed. [employer]

## 2019-03-23 NOTE — ED Notes (Signed)
Pt ambulated to restroom without incident.

## 2019-03-25 ENCOUNTER — Ambulatory Visit (INDEPENDENT_AMBULATORY_CARE_PROVIDER_SITE_OTHER): Payer: Medicare Other

## 2019-03-25 DIAGNOSIS — J309 Allergic rhinitis, unspecified: Secondary | ICD-10-CM | POA: Diagnosis not present

## 2019-04-01 ENCOUNTER — Ambulatory Visit (INDEPENDENT_AMBULATORY_CARE_PROVIDER_SITE_OTHER): Payer: Medicare Other

## 2019-04-01 DIAGNOSIS — J309 Allergic rhinitis, unspecified: Secondary | ICD-10-CM | POA: Diagnosis not present

## 2019-04-09 DIAGNOSIS — H30033 Focal chorioretinal inflammation, peripheral, bilateral: Secondary | ICD-10-CM | POA: Diagnosis not present

## 2019-04-09 DIAGNOSIS — Z961 Presence of intraocular lens: Secondary | ICD-10-CM | POA: Diagnosis not present

## 2019-04-09 DIAGNOSIS — H40043 Steroid responder, bilateral: Secondary | ICD-10-CM | POA: Diagnosis not present

## 2019-04-09 DIAGNOSIS — Z79899 Other long term (current) drug therapy: Secondary | ICD-10-CM | POA: Diagnosis not present

## 2019-04-09 DIAGNOSIS — Z9841 Cataract extraction status, right eye: Secondary | ICD-10-CM | POA: Diagnosis not present

## 2019-04-09 DIAGNOSIS — H3581 Retinal edema: Secondary | ICD-10-CM | POA: Diagnosis not present

## 2019-04-09 DIAGNOSIS — H44113 Panuveitis, bilateral: Secondary | ICD-10-CM | POA: Diagnosis not present

## 2019-04-12 ENCOUNTER — Ambulatory Visit (INDEPENDENT_AMBULATORY_CARE_PROVIDER_SITE_OTHER): Payer: Medicare Other | Admitting: *Deleted

## 2019-04-12 DIAGNOSIS — J309 Allergic rhinitis, unspecified: Secondary | ICD-10-CM | POA: Diagnosis not present

## 2019-04-16 ENCOUNTER — Ambulatory Visit (INDEPENDENT_AMBULATORY_CARE_PROVIDER_SITE_OTHER): Payer: Medicare Other

## 2019-04-16 DIAGNOSIS — J309 Allergic rhinitis, unspecified: Secondary | ICD-10-CM

## 2019-04-24 ENCOUNTER — Ambulatory Visit (INDEPENDENT_AMBULATORY_CARE_PROVIDER_SITE_OTHER): Payer: Medicare Other | Admitting: *Deleted

## 2019-04-24 DIAGNOSIS — J309 Allergic rhinitis, unspecified: Secondary | ICD-10-CM | POA: Diagnosis not present

## 2019-05-09 ENCOUNTER — Emergency Department (HOSPITAL_COMMUNITY)
Admission: EM | Admit: 2019-05-09 | Discharge: 2019-05-09 | Disposition: A | Discharge status: Home / Self Care | Payer: Medicare Other | Attending: Emergency Medicine | Admitting: Emergency Medicine

## 2019-05-09 ENCOUNTER — Other Ambulatory Visit: Payer: Self-pay

## 2019-05-09 DIAGNOSIS — J45909 Unspecified asthma, uncomplicated: Secondary | ICD-10-CM | POA: Diagnosis not present

## 2019-05-09 DIAGNOSIS — Z79899 Other long term (current) drug therapy: Secondary | ICD-10-CM | POA: Diagnosis not present

## 2019-05-09 DIAGNOSIS — R1012 Left upper quadrant pain: Secondary | ICD-10-CM | POA: Diagnosis not present

## 2019-05-09 DIAGNOSIS — R1010 Upper abdominal pain, unspecified: Secondary | ICD-10-CM | POA: Insufficient documentation

## 2019-05-09 DIAGNOSIS — R112 Nausea with vomiting, unspecified: Secondary | ICD-10-CM | POA: Diagnosis not present

## 2019-05-09 DIAGNOSIS — Z91010 Allergy to peanuts: Secondary | ICD-10-CM | POA: Insufficient documentation

## 2019-05-09 LAB — CBC WITH DIFFERENTIAL/PLATELET
Abs Immature Granulocytes: 0.01 10*3/uL (ref 0.00–0.07)
Basophils Absolute: 0 10*3/uL (ref 0.0–0.1)
Basophils Relative: 0 %
Eosinophils Absolute: 0 10*3/uL (ref 0.0–0.5)
Eosinophils Relative: 1 %
HCT: 41.6 % (ref 36.0–46.0)
Hemoglobin: 12.6 g/dL (ref 12.0–15.0)
Immature Granulocytes: 0 %
Lymphocytes Relative: 19 %
Lymphs Abs: 0.9 10*3/uL (ref 0.7–4.0)
MCH: 28.4 pg (ref 26.0–34.0)
MCHC: 30.3 g/dL (ref 30.0–36.0)
MCV: 93.7 fL (ref 80.0–100.0)
Monocytes Absolute: 0.6 10*3/uL (ref 0.1–1.0)
Monocytes Relative: 12 %
Neutro Abs: 3.2 10*3/uL (ref 1.7–7.7)
Neutrophils Relative %: 68 %
Platelets: 309 10*3/uL (ref 150–400)
RBC: 4.44 MIL/uL (ref 3.87–5.11)
RDW: 14.2 % (ref 11.5–15.5)
WBC: 4.7 10*3/uL (ref 4.0–10.5)
nRBC: 0 % (ref 0.0–0.2)

## 2019-05-09 LAB — COMPREHENSIVE METABOLIC PANEL
ALT: 10 U/L (ref 0–44)
AST: 18 U/L (ref 15–41)
Albumin: 3.7 g/dL (ref 3.5–5.0)
Alkaline Phosphatase: 50 U/L (ref 38–126)
Anion gap: 7 (ref 5–15)
BUN: 10 mg/dL (ref 6–20)
CO2: 21 mmol/L — ABNORMAL LOW (ref 22–32)
Calcium: 8.8 mg/dL — ABNORMAL LOW (ref 8.9–10.3)
Chloride: 108 mmol/L (ref 98–111)
Creatinine, Ser: 0.81 mg/dL (ref 0.44–1.00)
GFR calc Af Amer: >60 mL/min (ref >60)
GFR calc non Af Amer: >60 mL/min (ref >60)
Glucose, Bld: 92 mg/dL (ref 70–99)
Potassium: 4.6 mmol/L (ref 3.5–5.1)
Sodium: 136 mmol/L (ref 135–145)
Total Bilirubin: 0.9 mg/dL (ref 0.3–1.2)
Total Protein: 7.5 g/dL (ref 6.5–8.1)

## 2019-05-09 LAB — URINALYSIS, ROUTINE W REFLEX MICROSCOPIC
Bilirubin Urine: NEGATIVE
Glucose, UA: NEGATIVE mg/dL
Ketones, ur: NEGATIVE mg/dL
Nitrite: NEGATIVE
Protein, ur: 100 mg/dL — AB
RBC / HPF: >50 RBC/hpf — ABNORMAL HIGH (ref 0–5)
Specific Gravity, Urine: 1.02 (ref 1.005–1.030)
pH: 6 (ref 5.0–8.0)

## 2019-05-09 LAB — I-STAT BETA HCG BLOOD, ED (MC, WL, AP ONLY): I-stat hCG, quantitative: <5 m[IU]/mL (ref <5)

## 2019-05-09 LAB — LIPASE, BLOOD: Lipase: 15 U/L (ref 11–51)

## 2019-05-09 MED ORDER — PANTOPRAZOLE SODIUM 40 MG IV SOLR
40.0000 mg | Freq: Once | INTRAVENOUS | Status: AC
  Administered 2019-05-09: 40 mg via INTRAVENOUS
  Filled 2019-05-09: qty 40

## 2019-05-09 MED ORDER — LIDOCAINE VISCOUS HCL 2 % MT SOLN
15.0000 mL | Freq: Once | OROMUCOSAL | Status: AC
  Administered 2019-05-09: 13:00:00 15 mL via ORAL
  Filled 2019-05-09: qty 15

## 2019-05-09 MED ORDER — ALUM & MAG HYDROXIDE-SIMETH 200-200-20 MG/5ML PO SUSP
30.0000 mL | Freq: Once | ORAL | Status: AC
  Administered 2019-05-09: 30 mL via ORAL
  Filled 2019-05-09: qty 30

## 2019-05-09 MED ORDER — PANTOPRAZOLE SODIUM 20 MG PO TBEC: 20.0000 mg | DELAYED_RELEASE_TABLET | Freq: Every day | ORAL | 0 refills | Status: DC

## 2019-05-09 MED ORDER — SUCRALFATE 1 GM/10ML PO SUSP: 1.0000 g | Freq: Three times a day (TID) | ORAL | 0 refills | Status: DC

## 2019-05-09 NOTE — ED Notes (Signed)
Patient verbalizes understanding of discharge instructions. Opportunity for questioning and answers were provided. Armband removed by staff, pt discharged from ED to home 

## 2019-05-09 NOTE — ED Triage Notes (Signed)
Pt reports upper abdominal pain for the last 4 days. Has had emesis but no fever. TRied zofran but continues to have vomiting and pain. VSS.

## 2019-05-09 NOTE — Discharge Instructions (Signed)
Take Protonix daily to decrease stomach acid.  Use Carafate as needed for pain. Make sure you are staying well-hydrated water. Avoid anti-inflammatories such as ibuprofen, naproxen, Aleve, Advil.  You may use Tylenol as needed for pain. Avoid greasy, spicy, acidic foods, this may irritate your stomach lining. Make sure you remain upright after eating for at least 60 minutes prior to laying flat. Follow-up with the stomach doctor listed below or your primary care doctor for further evaluation of your symptoms. Return to the emergency room if you develop high fevers, severe worsening pain, persistent vomiting, or any new, worsening, concerning symptoms.

## 2019-05-09 NOTE — ED Provider Notes (Signed)
[facility] EMERGENCY DEPARTMENT Provider Note   CSN: [id number] Arrival date & time: 05/09/19  1135     History Chief Complaint  Patient presents with  . Abdominal Pain    Tiffany Velasquez is a 24 y.o. female presented for evaluation of upper abdominal pain, nausea, vomiting.  Patient states for the past 4 days, she has been having persistent upper abdominal pain.  Pain began after she went to bed after eating wings and watermelon.  Pain has been constant since, described as a sharp cramping.  She has associated nausea, vomited x1 the first day pain was present.  She took Tylenol which initially improved her symptoms, but now was not helping.  She is not tried anything else.  She denies fevers, chills, chest pain, shortness breath, cough, lower abdominal pain, urinary symptoms, abnormal bowel movements.  Patient states her period started 4 days ago, ended yesterday.  It was normal for her.  She does not normally get pain like this during her period.  She denies previous history of abdominal problems.  No previous abdominal surgeries.  She takes eyedrops, but no other medications.  She is not on immunosuppressants.  She is no longer on methotrexate.  She is not sexually active.  She denies alcohol, tobacco, or drug use.  She denies previous history of GERD or reflux.  HPI     Past Medical History:  Diagnosis Date  . Asthma   . Eczema     Patient Active Problem List   Diagnosis Date Noted  . Iritis 07/26/2018  . Seasonal and perennial allergic rhinitis 07/26/2018  . Acute upper respiratory infection 06/28/2017  . Mild persistent asthma, uncomplicated 06/15/2017  . Acute atopic conjunctivitis of left eye 06/15/2017  . Adverse food reaction 06/15/2017  . Moderate persistent asthma without complication 01/16/2017  . Intrinsic atopic dermatitis 01/16/2017  . Low back pain 12/22/2015  . Allergic rhinoconjunctivitis 12/06/2014  . Food allergy 12/06/2014  . Chalazion  of right upper eyelid 09/18/2014  . Healthcare maintenance 04/12/2014  . Asthma, chronic 06/22/2012  . Elevated blood pressure reading without diagnosis of hypertension 06/22/2012  . Family history of sudden cardiac death Aug 05, 2011  . Morbid obesity (HCC) 10/05/2009  . HALLUCINATIONS 10/05/2009  . CONSTIPATION, CHRONIC 04/16/2008  . ENCOPRESIS 05/25/2006  . ATTENTION DEFICIT, W/HYPERACTIVITY 05/25/2006  . ALLERGIC CONJUNCTIVITIS 05/25/2006  . Allergic rhinitis 05/25/2006  . Severe eczema 05/25/2006    Past Surgical History:  Procedure Laterality Date  . no past surgery       OB History   No obstetric history on file.     Family History  Problem Relation Age of Onset  . Sudden death Cousin   . Asthma Mother   . Allergic rhinitis Father   . Eczema Neg Hx   . Urticaria Neg Hx     Social History   Tobacco Use  . Smoking status: Never Smoker  . Smokeless tobacco: Never Used  Substance Use Topics  . Alcohol use: No  . Drug use: No    Home Medications Prior to Admission medications   Medication Sig Start Date End Date Taking? Authorizing Provider  albuterol (PROAIR HFA) 108 (90 Base) MCG/ACT inhaler Inhale 2 puffs into the lungs every 6 (six) hours as needed for wheezing or shortness of breath. 01/16/17   Alfonse Spruce, MD  azelastine (ASTELIN) 0.1 % nasal spray Use 2 sprays in each nostril twice daily if needed 02/28/19   Alfonse Spruce, MD  budesonide-formoterol (  SYMBICORT) 160-4.5 MCG/ACT inhaler Inhale 2 puffs into the lungs 2 (two) times daily. 11/27/18   Alfonse Spruce, MD  cetirizine (ZYRTEC) 10 MG tablet Take 1 tablet (10 mg total) by mouth daily. 02/28/19   Alfonse Spruce, MD  ciclopirox (LOPROX) 0.77 % cream APPLY 1 APPLICATION TO ACTIVE RASH ON BODY BID PRN 12/01/16   [provider]  clindamycin (CLEOCIN T) 1 % lotion APPLY APPLICATION ON THE FACE D QAM 08/17/15   [provider]  clobetasol ointment (TEMOVATE) 0.05 %  APPLY ON THE SKIN BID FOR ACTIVE ECZEMA 11/10/15   [provider]  desonide (DESOWEN) 0.05 % ointment APP APPLICATION ON FACE BID AS DIRECTED 12/17/15   [provider]  fluocinonide cream (LIDEX) 0.05 %  01/22/14   [provider]  fluticasone (FLOVENT HFA) 110 MCG/ACT inhaler Inhale 2 puffs into the lungs 2 (two) times daily. 07/26/18   Alfonse Spruce, MD  halobetasol (ULTRAVATE) 0.05 % ointment APPLY TO RASH ON BODY BID PRF FLARES UTD 08/17/15   [provider]  hydrOXYzine (ATARAX/VISTARIL) 10 MG tablet Take 1 tablet (10 mg total) by mouth daily as needed. 01/16/17   Alfonse Spruce, MD  ketoconazole (NIZORAL) 2 % cream APPLY 1 APPLICATION TO RASH ON FACE BID PRN 12/01/16   [provider]  montelukast (SINGULAIR) 10 MG tablet Take 1 tablet (10 mg total) by mouth at bedtime. 02/28/19   Alfonse Spruce, MD  naproxen (NAPROSYN) 375 MG tablet Take 1 tablet (375 mg total) by mouth 2 (two) times daily with a meal. 09/03/17   Damian Leavell, [place], NP  pantoprazole (PROTONIX) 20 MG tablet Take 1 tablet (20 mg total) by mouth daily. 05/09/19   Leontina Skidmore, PA-C  polyethylene glycol powder (MIRALAX) powder Take 17 g by mouth daily. 08/04/11   Ardyth Gal, MD  sucralfate (CARAFATE) 1 GM/10ML suspension Take 10 mLs (1 g total) by mouth 4 (four) times daily -  with meals and at bedtime. 05/09/19   Javyn Havlin, PA-C  tacrolimus (PROTOPIC) 0.1 % ointment APPLY TO RASH ON BODY BID 08/17/15   [provider]    Allergies    Peanut-containing drug products, Shellfish allergy, Apple, Orange fruit [citrus], Peach flavor, Tomato, and Tree extract  Review of Systems   Review of Systems  Gastrointestinal: Positive for abdominal pain, nausea and vomiting (x1).  All other systems reviewed and are negative.   Physical Exam Updated Vital Signs BP (!) 146/87   Pulse 64   Temp 99.3 F (37.4 C) (Oral)   Resp 18   SpO2 100%   Physical  Exam Vitals and nursing note reviewed.  Constitutional:      General: She is not in acute distress.    Appearance: She is well-developed. She is obese.     Comments: Obese female resting in the bed in no acute distress  HENT:     Head: Normocephalic and atraumatic.  Eyes:     Conjunctiva/sclera: Conjunctivae normal.     Pupils: Pupils are equal, round, and reactive to light.  Cardiovascular:     Rate and Rhythm: Normal rate and regular rhythm.     Pulses: Normal pulses.  Pulmonary:     Effort: Pulmonary effort is normal. No respiratory distress.     Breath sounds: Normal breath sounds. No wheezing.  Abdominal:     General: There is no distension.     Palpations: Abdomen is soft. There is no mass.  Tenderness: There is abdominal tenderness in the right upper quadrant, epigastric area and left upper quadrant. There is no guarding or rebound.     Comments: Tenderness to palpation of upper abdomen, mostly in the epigastric region, but also mildly in bilateral upper quadrants.  No rigidity, guarding, distention.  Negative rebound.  No peritonitis.  Negative Murphy's.  Exam may be limited due to obese abdomen.  Musculoskeletal:        General: Normal range of motion.     Cervical back: Normal range of motion and neck supple.  Skin:    General: Skin is warm and dry.     Capillary Refill: Capillary refill takes less than 2 seconds.  Neurological:     Mental Status: She is alert and oriented to person, place, and time.     ED Results / Procedures / Treatments   Labs (all labs ordered are listed, but only abnormal results are displayed) Labs Reviewed  COMPREHENSIVE METABOLIC PANEL - Abnormal; Notable for the following components:      Result Value   CO2 21 (*)    Calcium 8.8 (*)    All other components within normal limits  URINALYSIS, ROUTINE W REFLEX MICROSCOPIC - Abnormal; Notable for the following components:   Color, Urine AMBER (*)    APPearance CLOUDY (*)    Hgb urine  dipstick LARGE (*)    Protein, ur 100 (*)    Leukocytes,Ua TRACE (*)    RBC / HPF >50 (*)    Bacteria, UA RARE (*)    All other components within normal limits  CBC WITH DIFFERENTIAL/PLATELET  LIPASE, BLOOD  I-STAT BETA HCG BLOOD, ED ([facility], [facility], [facility] ONLY)    EKG None  Radiology No results found.  Procedures Procedures (including critical care time)  Medications Ordered in ED Medications  pantoprazole (PROTONIX) injection 40 mg (40 mg Intravenous Given 05/09/19 1234)  alum & mag hydroxide-simeth (MAALOX/MYLANTA) 200-200-20 MG/5ML suspension 30 mL (30 mLs Oral Given 05/09/19 1232)    And  lidocaine (XYLOCAINE) 2 % viscous mouth solution 15 mL (15 mLs Oral Given 05/09/19 1232)    ED Course  I have reviewed the triage vital signs and the nursing notes.  Pertinent labs & imaging results that were available during my care of the patient were reviewed by me and considered in my medical decision making (see chart for details).    MDM Rules/Calculators/A&P                      Patient presenting for evaluation of upper abdominal pain with nausea and vomiting.  Physical examination, she appears nontoxic.  She does have tenderness palpation of the upper abdomen.  Symptoms worsened at night, occurred after eating wings.  Mostly patient with nausea, 1 episode of vomiting.  No history of similar.  Consider PUD versus GERD versus gastritis.  Consider gallbladder disease.  Consider pancreatitis.  Will obtain labs, urine, treat symptomatically and reassess.  Labs interpreted by me, overall reassuring.  No leukocytosis.  Kidney, liver, pancreatic function normal.  As such, likely gastritis versus PUD versus GERD.  Do not believe emergent CT or ultrasound would be beneficial today.  UA without infection, does show a large blood and greater than 50 red cells.  This may be due to resolving..  Patient without flank pain and exam is not consistent with kidney stones, will have patient follow-up with her  primary care doctor for further evaluation.  On reassessment, patient reports symptoms are  improved with GI cocktail and Protonix.  Discussed continued symptomatic treatment at home and close follow-up with primary care and/or GI for further evaluation.  At this time, patient appears safe for discharge.  Return precautions given.  Patient states she understands and agrees to plan.  Final Clinical Impression(s) / ED Diagnoses Final diagnoses:  Upper abdominal pain  Non-intractable vomiting with nausea, unspecified vomiting type    Rx / DC Orders ED Discharge Orders         Ordered    pantoprazole (PROTONIX) 20 MG tablet  Daily     05/09/19 1327    sucralfate (CARAFATE) 1 GM/10ML suspension  3 times daily with meals & bedtime     05/09/19 1327           Diangelo Radel, PA-C 05/09/19 1403    Eber Hong, MD 05/10/19 [phone]

## 2019-05-17 ENCOUNTER — Ambulatory Visit (INDEPENDENT_AMBULATORY_CARE_PROVIDER_SITE_OTHER): Payer: Medicare Other | Admitting: *Deleted

## 2019-05-17 DIAGNOSIS — J309 Allergic rhinitis, unspecified: Secondary | ICD-10-CM

## 2019-05-22 ENCOUNTER — Telehealth: Payer: Self-pay | Admitting: Allergy & Immunology

## 2019-05-22 ENCOUNTER — Ambulatory Visit (INDEPENDENT_AMBULATORY_CARE_PROVIDER_SITE_OTHER): Payer: Medicare Other | Admitting: *Deleted

## 2019-05-22 DIAGNOSIS — J309 Allergic rhinitis, unspecified: Secondary | ICD-10-CM

## 2019-05-22 NOTE — Telephone Encounter (Signed)
Called and left a voicemail asking for patient to return call to discuss.  °

## 2019-05-22 NOTE — Telephone Encounter (Signed)
Patient wants to inquire on if she is eligible for the COVID shot

## 2019-05-27 NOTE — Telephone Encounter (Signed)
Called and spoke with patient and asked her the questions regarding the protocol for reactions to vaccines and any reactions to Mirilax or has had any facial fillers. Patient verbalized understanding and verbalized understanding and has denied any reactions or facial fillers. Advised to patient that it would be safe for her to get the COVID vaccine when she becomes eligible. Patient verbalized understanding.

## 2019-05-29 ENCOUNTER — Ambulatory Visit (INDEPENDENT_AMBULATORY_CARE_PROVIDER_SITE_OTHER): Payer: Medicare Other

## 2019-05-29 DIAGNOSIS — J309 Allergic rhinitis, unspecified: Secondary | ICD-10-CM

## 2019-06-04 DIAGNOSIS — Z9841 Cataract extraction status, right eye: Secondary | ICD-10-CM | POA: Diagnosis not present

## 2019-06-04 DIAGNOSIS — H30033 Focal chorioretinal inflammation, peripheral, bilateral: Secondary | ICD-10-CM | POA: Diagnosis not present

## 2019-06-04 DIAGNOSIS — Z961 Presence of intraocular lens: Secondary | ICD-10-CM | POA: Diagnosis not present

## 2019-06-04 DIAGNOSIS — H3581 Retinal edema: Secondary | ICD-10-CM | POA: Diagnosis not present

## 2019-06-04 DIAGNOSIS — Z79899 Other long term (current) drug therapy: Secondary | ICD-10-CM | POA: Diagnosis not present

## 2019-06-04 DIAGNOSIS — H40043 Steroid responder, bilateral: Secondary | ICD-10-CM | POA: Diagnosis not present

## 2019-06-04 DIAGNOSIS — H44113 Panuveitis, bilateral: Secondary | ICD-10-CM | POA: Diagnosis not present

## 2019-06-05 ENCOUNTER — Ambulatory Visit (INDEPENDENT_AMBULATORY_CARE_PROVIDER_SITE_OTHER): Payer: Medicare Other

## 2019-06-05 DIAGNOSIS — J309 Allergic rhinitis, unspecified: Secondary | ICD-10-CM | POA: Diagnosis not present

## 2019-06-05 NOTE — Progress Notes (Signed)
VIALS EXP 06-04-20 

## 2019-06-06 DIAGNOSIS — J301 Allergic rhinitis due to pollen: Secondary | ICD-10-CM | POA: Diagnosis not present

## 2019-06-10 DIAGNOSIS — J3089 Other allergic rhinitis: Secondary | ICD-10-CM | POA: Diagnosis not present

## 2019-06-14 ENCOUNTER — Ambulatory Visit (INDEPENDENT_AMBULATORY_CARE_PROVIDER_SITE_OTHER): Payer: Medicare Other

## 2019-06-14 DIAGNOSIS — J309 Allergic rhinitis, unspecified: Secondary | ICD-10-CM

## 2019-06-21 ENCOUNTER — Ambulatory Visit (INDEPENDENT_AMBULATORY_CARE_PROVIDER_SITE_OTHER): Payer: Medicare Other | Admitting: *Deleted

## 2019-06-21 DIAGNOSIS — J309 Allergic rhinitis, unspecified: Secondary | ICD-10-CM | POA: Diagnosis not present

## 2019-07-03 ENCOUNTER — Ambulatory Visit (INDEPENDENT_AMBULATORY_CARE_PROVIDER_SITE_OTHER): Payer: Medicare Other | Admitting: *Deleted

## 2019-07-03 DIAGNOSIS — J309 Allergic rhinitis, unspecified: Secondary | ICD-10-CM

## 2019-07-12 ENCOUNTER — Ambulatory Visit (INDEPENDENT_AMBULATORY_CARE_PROVIDER_SITE_OTHER): Payer: Medicare Other

## 2019-07-12 DIAGNOSIS — J309 Allergic rhinitis, unspecified: Secondary | ICD-10-CM

## 2019-07-17 ENCOUNTER — Ambulatory Visit (INDEPENDENT_AMBULATORY_CARE_PROVIDER_SITE_OTHER): Payer: Medicare Other

## 2019-07-17 DIAGNOSIS — J309 Allergic rhinitis, unspecified: Secondary | ICD-10-CM

## 2019-07-18 ENCOUNTER — Telehealth: Payer: Self-pay | Admitting: Allergy & Immunology

## 2019-07-18 NOTE — Telephone Encounter (Signed)
Patient's mother called and states that she would like a brief letter stating that the electricity needs to stay on at all times due to having a humidifier for allergies (clarified that it was the thing that clears the room constantly). Patient's mother states that they need the letter as soon as possible.  Please advise.

## 2019-07-19 NOTE — Telephone Encounter (Signed)
Letter has been printed and placed up front. Called the patient's mother and left a detailed voicemail per DPR permission advising that the letter is up front in the [place] office and ready for pick up.

## 2019-07-19 NOTE — Telephone Encounter (Signed)
I can write the letter for [facility].  Can you please print off a letter from the letters tab? Marland Kitchen Malachi Bonds, MD [facility]

## 2019-07-19 NOTE — Telephone Encounter (Signed)
Dr. Dellis Anes please advise. This is on behalf of the patient's mother Laneisha Mino that was requesting the letter yesterday about her allergies. She was stating yesterday that she needed the letter before 5pm. Please advise.

## 2019-07-22 ENCOUNTER — Ambulatory Visit (INDEPENDENT_AMBULATORY_CARE_PROVIDER_SITE_OTHER): Payer: Medicare Other

## 2019-07-22 DIAGNOSIS — J309 Allergic rhinitis, unspecified: Secondary | ICD-10-CM | POA: Diagnosis not present

## 2019-08-07 ENCOUNTER — Ambulatory Visit (INDEPENDENT_AMBULATORY_CARE_PROVIDER_SITE_OTHER): Payer: Medicare Other

## 2019-08-07 DIAGNOSIS — J309 Allergic rhinitis, unspecified: Secondary | ICD-10-CM

## 2019-08-16 ENCOUNTER — Ambulatory Visit (INDEPENDENT_AMBULATORY_CARE_PROVIDER_SITE_OTHER): Payer: Medicare Other

## 2019-08-16 DIAGNOSIS — J309 Allergic rhinitis, unspecified: Secondary | ICD-10-CM | POA: Diagnosis not present

## 2019-08-23 ENCOUNTER — Ambulatory Visit (INDEPENDENT_AMBULATORY_CARE_PROVIDER_SITE_OTHER): Payer: Medicare Other

## 2019-08-23 DIAGNOSIS — J309 Allergic rhinitis, unspecified: Secondary | ICD-10-CM

## 2019-08-29 ENCOUNTER — Ambulatory Visit: Payer: Medicare Other | Admitting: Allergy & Immunology

## 2019-08-30 ENCOUNTER — Ambulatory Visit (INDEPENDENT_AMBULATORY_CARE_PROVIDER_SITE_OTHER): Payer: Medicare Other

## 2019-08-30 DIAGNOSIS — J309 Allergic rhinitis, unspecified: Secondary | ICD-10-CM

## 2019-09-03 ENCOUNTER — Ambulatory Visit (INDEPENDENT_AMBULATORY_CARE_PROVIDER_SITE_OTHER): Payer: Medicare Other

## 2019-09-03 DIAGNOSIS — J309 Allergic rhinitis, unspecified: Secondary | ICD-10-CM | POA: Diagnosis not present

## 2019-09-10 ENCOUNTER — Ambulatory Visit (INDEPENDENT_AMBULATORY_CARE_PROVIDER_SITE_OTHER): Payer: Medicare Other | Admitting: Allergy & Immunology

## 2019-09-10 ENCOUNTER — Ambulatory Visit: Payer: Self-pay

## 2019-09-10 ENCOUNTER — Encounter: Payer: Self-pay | Admitting: Allergy & Immunology

## 2019-09-10 ENCOUNTER — Other Ambulatory Visit: Payer: Self-pay

## 2019-09-10 VITALS — BP 116/64 | HR 97 | Resp 21

## 2019-09-10 DIAGNOSIS — J302 Other seasonal allergic rhinitis: Secondary | ICD-10-CM

## 2019-09-10 DIAGNOSIS — J4540 Moderate persistent asthma, uncomplicated: Secondary | ICD-10-CM | POA: Diagnosis not present

## 2019-09-10 DIAGNOSIS — L309 Dermatitis, unspecified: Secondary | ICD-10-CM | POA: Diagnosis not present

## 2019-09-10 DIAGNOSIS — T7800XD Anaphylactic reaction due to unspecified food, subsequent encounter: Secondary | ICD-10-CM

## 2019-09-10 DIAGNOSIS — J309 Allergic rhinitis, unspecified: Secondary | ICD-10-CM

## 2019-09-10 DIAGNOSIS — J3089 Other allergic rhinitis: Secondary | ICD-10-CM | POA: Diagnosis not present

## 2019-09-10 MED ORDER — FLOVENT HFA 110 MCG/ACT IN AERO: 2.0000 | INHALATION_SPRAY | Freq: Two times a day (BID) | RESPIRATORY_TRACT | 5 refills | Status: DC

## 2019-09-10 MED ORDER — ALBUTEROL SULFATE HFA 108 (90 BASE) MCG/ACT IN AERS: 2.0000 | INHALATION_SPRAY | Freq: Four times a day (QID) | RESPIRATORY_TRACT | 3 refills | Status: DC | PRN

## 2019-09-10 NOTE — Progress Notes (Signed)
FOLLOW UP  Date of Service/Encounter:  09/10/19   Assessment:   Moderate persistent asthma without complication  Seasonal and perennial allergic rhinitis- on allergen immunotherapy with maintenance reached February 2021  Intrinsic atopic dermatitis - followed by Dr. 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 Moderate persistent asthma, uncomplicated - Lung testing looked stable today.  - We are going to decrease the Symbicort to the 80/4.17mcg dose - Daily controller medication(s): Symbicort 80/4.5 two puffs twice daily with spacer - Rescue medications: ProAir 4 puffs every 4-6 hours as needed - Changes during respiratory infections or worsening symptoms: add Flovent 12mcg to 2 puffs twice daily for ONE TO TWO WEEKS - Asthma control goals:  * Full participation in all desired activities (may need albuterol before activity) * Albuterol use two time or less a week on average (not counting use with activity) * Cough interfering with sleep two time or less a month * Oral steroids no more than once a year * No hospitalizations  2. Chronic allergic rhinitis  - Continue with allergy shots at the same schedule.  - Continue with Singulair, cetirizine, and Astelin.    3. Atopic dermatitis - Continue with moisturizing twice daily. - Continue with [employer].   - Continue with your medicated ointments as needed.   4. Multiple food allergies (peanuts, shellfish, multiple fruits/vegetables) - EpiPen is up to date.  - Continue to avoid peanuts, tree nuts, and shellfish.    5. Return in about 6 months (around 03/11/2020). This can be an in-person, a virtual Webex or a telephone follow up visit.   Subjective:   Tiffany Velasquez is a 24 y.o. female presenting today for follow up of  Chief Complaint  Patient presents with  . Asthma    Tiffany Velasquez has a history of the following: Patient Active Problem List   Diagnosis Date Noted  . Iritis 07/26/2018  . Seasonal and perennial allergic rhinitis 07/26/2018  . Acute upper respiratory infection 06/28/2017  . Mild persistent asthma, uncomplicated 78/93/8101  . Acute atopic conjunctivitis of left eye 06/15/2017  . Adverse food reaction 06/15/2017  . Moderate persistent asthma without complication 75/12/2583  . Intrinsic atopic dermatitis 01/16/2017  . Low back pain 12/22/2015  . Allergic rhinoconjunctivitis 12/06/2014  . Food allergy 12/06/2014  . Chalazion of right upper eyelid 09/18/2014  . Healthcare maintenance 04/12/2014  . Asthma, chronic 06/22/2012  . Elevated blood pressure reading without diagnosis of hypertension 06/22/2012  . Family history of sudden cardiac death 2011/08/25  . Morbid obesity ([place]) 10/05/2009  . HALLUCINATIONS 10/05/2009  . CONSTIPATION, CHRONIC 04/16/2008  . ENCOPRESIS 05/25/2006  . ATTENTION DEFICIT, W/HYPERACTIVITY 05/25/2006  . ALLERGIC CONJUNCTIVITIS 05/25/2006  . Allergic rhinitis 05/25/2006  . Severe eczema 05/25/2006    History obtained from: chart review and patient.  Tiffany Velasquez is a 24 y.o. female presenting for a follow up visit. She was last seen in December 2020. At that time, we continued with Symbicort two puffs BID with FLovent added during respiratory flares. For her allergic rihnitis, we continued with Singulair, cetirizine, and Astelin. WE did recommend increasing the cetirizine to BID to see if this helped at all. AD was under good control with medicated ointments as well as Dove Sensitive and mostiurizing daily. She has a history  of multiple food allergies (peanuts, shellfish, multiple fruits/vegetablkes. We reocmmended continued avoidance of peanuts, tree nuts, and shellfish.   Since the last visit, she has  done well. She actually went to [place] recently and had a great time out there. Some other family paid for that trip. She also did some traveling to [place] and [place]. She did both COVID vaccines before she went to [place].    Asthma Symptom History: She remains on the Symbicort 160/4.5 mcg 2 puffs twice daily with a spacer.  She has not been using her rescue inhaler at all.  She reports good control with this. Tiffany Velasquez's asthma has been well controlled. She has not required rescue medication, experienced nocturnal awakenings due to lower respiratory symptoms, nor have activities of daily living been limited. She has required no Emergency Department or Urgent Care visits for her asthma. She has required zero courses of systemic steroids for asthma exacerbations since the last visit. ACT score today is 25, indicating excellent asthma symptom control.   Allergic Rhinitis Symptom History: Her allergy shots are going well.  She is also on Singulair, cetirizine, and Astelin.  She is not using a nasal steroid due to her ocular history.  She has not needed antibiotics in quite some time.  Overall, she feels that her symptoms have improved since restarting the shots.  Tiffany Velasquez is on allergen immunotherapy. She receives two injections. Immunotherapy script #1 contains trees, weeds and grasses. She currently receives 0.56mL of the RED vial (1/100). Immunotherapy script #2 contains molds, dust mites, cat, dog and cockroach. She currently receives 0.25mL of the RED vial (1/100). She re-started shots May of 2020 and reached maintenance in February of 2021.  Eczema Symptom History: She continues to follow with Dr. Leonie Man.  Her last visit with her was just within the last month.  She did refill all of her medicated ointments.  She also received some medicine for acne.  Ophthalmologic Symptom History: She continues to follow with Dr. Sherryll Burger.  She was actually referred to another ophthalmologist for  evaluation of her cataracts, but they want to wait until they are more pronounced before removing them.  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  Otherwise, there have been no changes to her past medical history, surgical history, family history, or social history.    Review of Systems  Constitutional: Negative.  Negative for chills, fever, malaise/fatigue and weight loss.  HENT: Positive for congestion. Negative for ear discharge, ear pain, nosebleeds and sinus pain.   Eyes: Negative for pain, discharge and redness.  Respiratory: Negative for cough, sputum production, shortness of breath and wheezing.   Cardiovascular: Negative.  Negative for chest pain and palpitations.  Gastrointestinal: Negative for abdominal pain, constipation, diarrhea, heartburn, nausea and vomiting.  Skin: Negative.  Negative for itching and rash.  Neurological: Negative for dizziness and headaches.  Endo/Heme/Allergies: Positive for environmental allergies. Does not bruise/bleed easily.       Objective:   Blood pressure 116/64, pulse 97, resp. rate (!) 21, SpO2 98 %. There is no height or weight on file to calculate BMI.   Physical Exam:  Physical Exam  Constitutional: She appears well-developed.  Talkative female.  Cooperative with the exam.  HENT:  Head: Normocephalic and atraumatic.  Right Ear: Tympanic membrane, external ear and ear canal normal.  Left Ear: Tympanic membrane, external ear and ear canal normal.  Nose: No mucosal edema, rhinorrhea, nasal deformity or septal deviation. Right sinus exhibits no maxillary sinus tenderness  and no frontal sinus tenderness. Left sinus exhibits no maxillary sinus tenderness and no frontal sinus tenderness.  Mouth/Throat: Uvula is midline. Mucous membranes are not pale and not dry.  Eyes: Pupils are equal, round, and reactive to light. Conjunctivae are normal. Right eye exhibits no chemosis and no discharge. Left eye exhibits no chemosis and  no discharge. Right conjunctiva is not injected. Left conjunctiva is not injected.  Cardiovascular: Normal rate, regular rhythm and normal heart sounds.  Respiratory: Effort normal and breath sounds normal. No accessory muscle usage. No tachypnea. No respiratory distress. She has no wheezes. She has no rhonchi. She has no rales. She exhibits no tenderness.  Lymphadenopathy:    She has no cervical adenopathy.  Neurological: She is alert.  Skin: No abrasion, no petechiae and no rash noted. Rash is not papular, not vesicular and not urticarial. No erythema. No pallor.     Diagnostic studies:    Spirometry: results abnormal (FEV1: 2.25/67%, FVC: 2.94/76%, FEV1/FVC: 77%).    Spirometry consistent with possible restrictive disease.   Allergy Studies: none      Malachi Bonds, MD  [facility]

## 2019-09-10 NOTE — Patient Instructions (Addendum)
1. Moderate persistent asthma, uncomplicated - Lung testing looked stable today.  - We are going to decrease the Symbicort to the 80/4.44mcg dose - Daily controller medication(s): Symbicort 80/4.5 two puffs twice daily with spacer - Rescue medications: ProAir 4 puffs every 4-6 hours as needed - Changes during respiratory infections or worsening symptoms: add Flovent to 2 puffs twice daily for ONE TO TWO WEEKS - Asthma control goals:  * Full participation in all desired activities (may need albuterol before activity) * Albuterol use two time or less a week on average (not counting use with activity) * Cough interfering with sleep two time or less a month * Oral steroids no more than once a year * No hospitalizations  2. Chronic allergic rhinitis  - Continue with allergy shots at the same schedule.  - Continue with Singulair, cetirizine, and Astelin.    3. Atopic dermatitis - Continue with moisturizing twice daily. - Continue with [employer].   - Continue with your medicated ointments as needed.   4. Multiple food allergies (peanuts, shellfish, multiple fruits/vegetables) - EpiPen is up to date.  - Continue to avoid peanuts, tree nuts, and shellfish.    5. Return in about 6 months (around 03/11/2020). This can be an in-person, a virtual Webex or a telephone follow up visit.   Please inform us of any Emergency Department visits, hospitalizations, or changes in symptoms. Call us before going to the ED for breathing or allergy symptoms since we might be able to fit you in for a sick visit. Feel free to contact us anytime with any questions, problems, or concerns.  It was a pleasure to see you again today!  Websites that have reliable patient information: 1. American Academy of Asthma, Allergy, and Immunology: www.aaaai.org 2. Food Allergy Research and Education (FARE): foodallergy.org 3. Mothers of Asthmatics: http://www.asthmacommunitynetwork.org 4. American College of  Allergy, Asthma, and Immunology: www.acaai.org   COVID-19 Vaccine Information can be found at: PodExchange.[email or web address] For questions related to vaccine distribution or appointments, please email [email or web address] or call [phone].     "Like" [place] on Facebook and Instagram for our latest updates!        Make sure you are registered to vote! If you have moved or changed any of your contact information, you will need to get this updated before voting!  In some cases, you MAY be able to register to vote online: [email or web address]

## 2019-09-11 ENCOUNTER — Encounter: Payer: Self-pay | Admitting: Allergy & Immunology

## 2019-10-04 ENCOUNTER — Ambulatory Visit (INDEPENDENT_AMBULATORY_CARE_PROVIDER_SITE_OTHER): Payer: Medicare Other

## 2019-10-04 DIAGNOSIS — J309 Allergic rhinitis, unspecified: Secondary | ICD-10-CM

## 2019-10-21 ENCOUNTER — Ambulatory Visit (INDEPENDENT_AMBULATORY_CARE_PROVIDER_SITE_OTHER): Payer: Medicare Other

## 2019-10-21 DIAGNOSIS — J309 Allergic rhinitis, unspecified: Secondary | ICD-10-CM

## 2019-10-23 DIAGNOSIS — H4043X4 Glaucoma secondary to eye inflammation, bilateral, indeterminate stage: Secondary | ICD-10-CM | POA: Diagnosis not present

## 2019-10-23 DIAGNOSIS — H209 Unspecified iridocyclitis: Secondary | ICD-10-CM | POA: Diagnosis not present

## 2019-11-07 ENCOUNTER — Ambulatory Visit (INDEPENDENT_AMBULATORY_CARE_PROVIDER_SITE_OTHER): Payer: Medicare Other

## 2019-11-07 DIAGNOSIS — J309 Allergic rhinitis, unspecified: Secondary | ICD-10-CM | POA: Diagnosis not present

## 2019-11-11 ENCOUNTER — Ambulatory Visit (INDEPENDENT_AMBULATORY_CARE_PROVIDER_SITE_OTHER): Payer: Medicare Other

## 2019-11-11 DIAGNOSIS — J309 Allergic rhinitis, unspecified: Secondary | ICD-10-CM

## 2019-11-22 ENCOUNTER — Ambulatory Visit (INDEPENDENT_AMBULATORY_CARE_PROVIDER_SITE_OTHER): Payer: Medicare Other | Admitting: *Deleted

## 2019-11-22 DIAGNOSIS — J309 Allergic rhinitis, unspecified: Secondary | ICD-10-CM | POA: Diagnosis not present

## 2019-12-04 DIAGNOSIS — J301 Allergic rhinitis due to pollen: Secondary | ICD-10-CM | POA: Diagnosis not present

## 2019-12-04 NOTE — Progress Notes (Signed)
VIALS EXP 12-03-20 

## 2019-12-05 DIAGNOSIS — J3089 Other allergic rhinitis: Secondary | ICD-10-CM | POA: Diagnosis not present

## 2019-12-06 ENCOUNTER — Ambulatory Visit (INDEPENDENT_AMBULATORY_CARE_PROVIDER_SITE_OTHER): Payer: Medicare Other

## 2019-12-06 DIAGNOSIS — J309 Allergic rhinitis, unspecified: Secondary | ICD-10-CM

## 2019-12-17 DIAGNOSIS — H44113 Panuveitis, bilateral: Secondary | ICD-10-CM | POA: Diagnosis not present

## 2019-12-17 DIAGNOSIS — Z961 Presence of intraocular lens: Secondary | ICD-10-CM | POA: Diagnosis not present

## 2019-12-17 DIAGNOSIS — H40043 Steroid responder, bilateral: Secondary | ICD-10-CM | POA: Diagnosis not present

## 2019-12-17 DIAGNOSIS — Z79899 Other long term (current) drug therapy: Secondary | ICD-10-CM | POA: Diagnosis not present

## 2019-12-17 DIAGNOSIS — Z9841 Cataract extraction status, right eye: Secondary | ICD-10-CM | POA: Diagnosis not present

## 2019-12-17 DIAGNOSIS — H3581 Retinal edema: Secondary | ICD-10-CM | POA: Diagnosis not present

## 2019-12-17 DIAGNOSIS — H30033 Focal chorioretinal inflammation, peripheral, bilateral: Secondary | ICD-10-CM | POA: Diagnosis not present

## 2019-12-27 ENCOUNTER — Ambulatory Visit (INDEPENDENT_AMBULATORY_CARE_PROVIDER_SITE_OTHER): Payer: Medicare Other | Admitting: *Deleted

## 2019-12-27 DIAGNOSIS — J309 Allergic rhinitis, unspecified: Secondary | ICD-10-CM

## 2020-01-07 ENCOUNTER — Ambulatory Visit (INDEPENDENT_AMBULATORY_CARE_PROVIDER_SITE_OTHER): Payer: Medicare Other

## 2020-01-07 DIAGNOSIS — J309 Allergic rhinitis, unspecified: Secondary | ICD-10-CM | POA: Diagnosis not present

## 2020-01-17 ENCOUNTER — Ambulatory Visit (INDEPENDENT_AMBULATORY_CARE_PROVIDER_SITE_OTHER): Payer: Medicare Other | Admitting: *Deleted

## 2020-01-17 DIAGNOSIS — J309 Allergic rhinitis, unspecified: Secondary | ICD-10-CM

## 2020-01-20 ENCOUNTER — Ambulatory Visit: Payer: Medicare Other

## 2020-01-22 ENCOUNTER — Ambulatory Visit (INDEPENDENT_AMBULATORY_CARE_PROVIDER_SITE_OTHER): Payer: Medicare Other | Admitting: *Deleted

## 2020-01-22 DIAGNOSIS — J309 Allergic rhinitis, unspecified: Secondary | ICD-10-CM
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
## 2020-01-23 DIAGNOSIS — H209 Unspecified iridocyclitis: Secondary | ICD-10-CM | POA: Diagnosis not present

## 2020-01-23 DIAGNOSIS — H4043X4 Glaucoma secondary to eye inflammation, bilateral, indeterminate stage: Secondary | ICD-10-CM | POA: Diagnosis not present

## 2020-02-05 ENCOUNTER — Ambulatory Visit (INDEPENDENT_AMBULATORY_CARE_PROVIDER_SITE_OTHER): Payer: Medicare Other

## 2020-02-05 ENCOUNTER — Other Ambulatory Visit: Payer: Self-pay

## 2020-02-05 DIAGNOSIS — Z23 Encounter for immunization: Secondary | ICD-10-CM | POA: Diagnosis not present

## 2020-02-05 NOTE — Progress Notes (Signed)
Patient presents in flu clinic for flu vaccine.  ° °Vaccine administered LD without complication.  ° °See admin for details.  °

## 2020-02-10 ENCOUNTER — Ambulatory Visit (INDEPENDENT_AMBULATORY_CARE_PROVIDER_SITE_OTHER): Payer: Medicare Other | Admitting: *Deleted

## 2020-02-10 DIAGNOSIS — J309 Allergic rhinitis, unspecified: Secondary | ICD-10-CM

## 2020-02-14 ENCOUNTER — Other Ambulatory Visit: Payer: Self-pay

## 2020-02-14 ENCOUNTER — Ambulatory Visit (INDEPENDENT_AMBULATORY_CARE_PROVIDER_SITE_OTHER): Payer: Medicare Other

## 2020-02-14 DIAGNOSIS — Z23 Encounter for immunization: Secondary | ICD-10-CM | POA: Diagnosis not present

## 2020-03-03 DIAGNOSIS — H44113 Panuveitis, bilateral: Secondary | ICD-10-CM | POA: Diagnosis not present

## 2020-03-03 DIAGNOSIS — H30033 Focal chorioretinal inflammation, peripheral, bilateral: Secondary | ICD-10-CM | POA: Diagnosis not present

## 2020-03-03 DIAGNOSIS — H3581 Retinal edema: Secondary | ICD-10-CM | POA: Diagnosis not present

## 2020-03-03 DIAGNOSIS — H4043X4 Glaucoma secondary to eye inflammation, bilateral, indeterminate stage: Secondary | ICD-10-CM | POA: Diagnosis not present

## 2020-03-03 DIAGNOSIS — Z961 Presence of intraocular lens: Secondary | ICD-10-CM | POA: Diagnosis not present

## 2020-03-03 DIAGNOSIS — H209 Unspecified iridocyclitis: Secondary | ICD-10-CM | POA: Diagnosis not present

## 2020-03-03 DIAGNOSIS — Z79899 Other long term (current) drug therapy: Secondary | ICD-10-CM | POA: Diagnosis not present

## 2020-03-03 DIAGNOSIS — Z9841 Cataract extraction status, right eye: Secondary | ICD-10-CM | POA: Diagnosis not present

## 2020-03-03 DIAGNOSIS — H40043 Steroid responder, bilateral: Secondary | ICD-10-CM | POA: Diagnosis not present

## 2020-03-04 ENCOUNTER — Ambulatory Visit (INDEPENDENT_AMBULATORY_CARE_PROVIDER_SITE_OTHER): Payer: Medicare Other | Admitting: *Deleted

## 2020-03-04 DIAGNOSIS — H30033 Focal chorioretinal inflammation, peripheral, bilateral: Secondary | ICD-10-CM | POA: Diagnosis not present

## 2020-03-04 DIAGNOSIS — J309 Allergic rhinitis, unspecified: Secondary | ICD-10-CM | POA: Diagnosis not present

## 2020-03-04 DIAGNOSIS — Z79899 Other long term (current) drug therapy: Secondary | ICD-10-CM | POA: Diagnosis not present

## 2020-03-12 ENCOUNTER — Encounter: Payer: Self-pay | Admitting: Allergy & Immunology

## 2020-03-12 ENCOUNTER — Other Ambulatory Visit: Payer: Self-pay

## 2020-03-12 ENCOUNTER — Ambulatory Visit: Payer: Medicare Other | Admitting: Allergy & Immunology

## 2020-03-12 ENCOUNTER — Ambulatory Visit (INDEPENDENT_AMBULATORY_CARE_PROVIDER_SITE_OTHER): Payer: Medicare Other | Admitting: Allergy & Immunology

## 2020-03-12 VITALS — BP 128/82 | HR 96 | Temp 97.3°F | Resp 16 | Ht 69.0 in | Wt >= 6400 oz

## 2020-03-12 DIAGNOSIS — J3089 Other allergic rhinitis: Secondary | ICD-10-CM | POA: Diagnosis not present

## 2020-03-12 DIAGNOSIS — J302 Other seasonal allergic rhinitis: Secondary | ICD-10-CM

## 2020-03-12 DIAGNOSIS — J4540 Moderate persistent asthma, uncomplicated: Secondary | ICD-10-CM

## 2020-03-12 DIAGNOSIS — H209 Unspecified iridocyclitis: Secondary | ICD-10-CM | POA: Diagnosis not present

## 2020-03-12 DIAGNOSIS — L309 Dermatitis, unspecified: Secondary | ICD-10-CM

## 2020-03-12 DIAGNOSIS — J309 Allergic rhinitis, unspecified: Secondary | ICD-10-CM | POA: Diagnosis not present

## 2020-03-12 DIAGNOSIS — T7800XD Anaphylactic reaction due to unspecified food, subsequent encounter: Secondary | ICD-10-CM | POA: Diagnosis not present

## 2020-03-12 MED ORDER — CETIRIZINE HCL 10 MG PO TABS: 10.0000 mg | ORAL_TABLET | Freq: Every day | ORAL | 5 refills | Status: DC

## 2020-03-12 MED ORDER — BUDESONIDE-FORMOTEROL FUMARATE 160-4.5 MCG/ACT IN AERO: 2.0000 | INHALATION_SPRAY | Freq: Two times a day (BID) | RESPIRATORY_TRACT | 5 refills | Status: DC

## 2020-03-12 MED ORDER — MONTELUKAST SODIUM 10 MG PO TABS: 10.0000 mg | ORAL_TABLET | Freq: Every day | ORAL | 5 refills | Status: DC

## 2020-03-12 MED ORDER — AZELASTINE HCL 0.1 % NA SOLN: NASAL | 5 refills | Status: DC

## 2020-03-12 MED ORDER — ALBUTEROL SULFATE HFA 108 (90 BASE) MCG/ACT IN AERS: 2.0000 | INHALATION_SPRAY | Freq: Four times a day (QID) | RESPIRATORY_TRACT | 3 refills | Status: DC | PRN

## 2020-03-12 NOTE — Patient Instructions (Addendum)
1. Moderate persistent asthma, uncomplicated - Lung testing looked slightly better today.  - We are going to increase to the higher Symbicort dose since you felt that this was working better for you.  - Daily controller medication(s): Symbicort 16/4.5 two puffs twice daily with spacer - Rescue medications: ProAir 4 puffs every 4-6 hours as needed - Changes during respiratory infections or worsening symptoms: add Flovent to 2 puffs twice daily for ONE TO TWO WEEKS - Asthma control goals:  * Full participation in all desired activities (may need albuterol before activity) * Albuterol use two time or less a week on average (not counting use with activity) * Cough interfering with sleep two time or less a month * Oral steroids no more than once a year * No hospitalizations  2. Chronic allergic rhinitis  - Continue with Singulair, cetirizine, and Astelin.   - Continue with allergy shots at the same schedule.   3. Atopic dermatitis - Continue with moisturizing twice daily. - Continue with [employer].   - Continue with your medicated ointments as needed.   4. Multiple food allergies (peanuts, shellfish, multiple fruits/vegetables) - EpiPen is up to date.  - Continue to avoid peanuts, tree nuts, and shellfish.    5. Return in about 6 months (around 09/10/2020).   Please inform us of any Emergency Department visits, hospitalizations, or changes in symptoms. Call us before going to the ED for breathing or allergy symptoms since we might be able to fit you in for a sick visit. Feel free to contact us anytime with any questions, problems, or concerns.  It was a pleasure to see you again today!  Websites that have reliable patient information: 1. American Academy of Asthma, Allergy, and Immunology: www.aaaai.org 2. Food Allergy Research and Education (FARE): foodallergy.org 3. Mothers of Asthmatics: http://www.asthmacommunitynetwork.org 4. American College of Allergy, Asthma,  and Immunology: www.acaai.org   COVID-19 Vaccine Information can be found at: [email or web address] For questions related to vaccine distribution or appointments, please email [email or web address] or call [phone].     "Like" [place] on Facebook and Instagram for our latest updates!     HAPPY FALL!     Make sure you are registered to vote! If you have moved or changed any of your contact information, you will need to get this updated before voting!  In some cases, you MAY be able to register to vote online: [email or web address]

## 2020-03-12 NOTE — Progress Notes (Signed)
FOLLOW UP  Date of Service/Encounter:  03/12/20   Assessment:   Moderate persistent asthma without complication  Seasonal and perennial allergic rhinitis-on allergen immunotherapy with maintenance reached February 2021  Intrinsic atopic dermatitis - followed by Dr. 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 Moderate persistent asthma, uncomplicated - Lung testing looked slightly better today.  - We are going to increase to the higher Symbicort dose since you felt that this was working better for you.  - Daily controller medication(s): Symbicort 16/4.5 two puffs twice daily with spacer - Rescue medications: ProAir 4 puffs every 4-6 hours as needed - Changes during respiratory infections or worsening symptoms: add Flovent to 2 puffs twice daily for ONE TO TWO WEEKS - Asthma control goals:  * Full participation in all desired activities (may need albuterol before activity) * Albuterol use two time or less a week on average (not counting use with activity) * Cough interfering with sleep two time or less a month * Oral steroids no more than once a year * No hospitalizations  2. Chronic allergic rhinitis  - Continue with Singulair, cetirizine, and Astelin.   - Continue with allergy shots at the same schedule.   3. Atopic dermatitis - Continue with moisturizing twice daily. - Continue with [employer].   - Continue with your medicated ointments as needed.   4. Multiple food allergies (peanuts, shellfish, multiple fruits/vegetables) - EpiPen is up to date.  - Continue to avoid peanuts, tree nuts, and shellfish.    5. Return in about 6 months (around 09/10/2020).   Subjective:   Tiffany Velasquez is a 24 y.o.  female presenting today for follow up of  Chief Complaint  Patient presents with  . Asthma    Feels winded the past few days. Nose congestion    [name] has a history of the following: Patient Active Problem List   Diagnosis Date Noted  . Iritis 07/26/2018  . Seasonal and perennial allergic rhinitis 07/26/2018  . Acute upper respiratory infection 06/28/2017  . Mild persistent asthma, uncomplicated 06/15/2017  . Acute atopic conjunctivitis of left eye 06/15/2017  . Adverse food reaction 06/15/2017  . Moderate persistent asthma without complication 01/16/2017  . Intrinsic atopic dermatitis 01/16/2017  . Low back pain 12/22/2015  . Allergic rhinoconjunctivitis 12/06/2014  . Food allergy 12/06/2014  . Chalazion of right upper eyelid 09/18/2014  . Healthcare maintenance 04/12/2014  . Asthma, chronic 06/22/2012  . Elevated blood pressure reading without diagnosis of hypertension 06/22/2012  . Family history of sudden cardiac death 15-Aug-2011  . Morbid obesity (HCC) 10/05/2009  . HALLUCINATIONS 10/05/2009  . CONSTIPATION, CHRONIC 04/16/2008  . ENCOPRESIS 05/25/2006  . ATTENTION DEFICIT, W/HYPERACTIVITY 05/25/2006  . ALLERGIC CONJUNCTIVITIS 05/25/2006  . Allergic rhinitis 05/25/2006  . Severe eczema 05/25/2006    History obtained from: chart review and patient.  Tiffany Velasquez is a 24 y.o. female presenting for a follow up visit.  She was last seen in June 2021.  At that time, her lung testing looks stable.  We decided to decrease her Symbicort to the low-dose 80 mcg strength since she was doing so well.  For her allergic rhinitis, we continue with Singulair, cetirizine, and Astelin as well as her allergy shots.  Atopic dermatitis was fairly well controlled with the  use of moisturizing twice daily and her medicated ointments as needed.  She continue to avoid all of her food allergies including peanuts, tree nuts, and shellfish.  Her EpiPen is up-to-date.  In the interim, she has done  very well.   Asthma/Respiratory Symptom History: She is still on the Symbicort two puffs twice daily. She does have some SOB episodes during the week. She has been using her rescue inhaler a couple of times per week. She did have to use it this week when she was at [facility].  Overall, she notices that she has been using her albuterol more frequently.  She thinks it might need to go back to the higher strength Symbicort. Hitomi's asthma has been well controlled. She has not required rescue medication, experienced nocturnal awakenings due to lower respiratory symptoms, nor have activities of daily living been limited. She has required no Emergency Department or Urgent Care visits for her asthma. She has required zero courses of systemic steroids for asthma exacerbations since the last visit. ACT score today is 22, indicating excellent asthma symptom control.   Allergic Rhinitis Symptom History: She remains on her Singulair, Astelin, and cetirizine.  She has not needed antibiotics at all since last visit.. Allergy shots are still going well. She has had more good days than bad days with the allergy shots.   Tiffany Velasquez is on allergen immunotherapy. She receives two injections. Immunotherapy script #1 contains trees, weeds and grasses. She currently receives 0.60mL of the RED vial (1/100). Immunotherapy script #2 contains molds, dust mites, cat, dog and cockroach. She currently receives 0.15mL of the RED vial (1/100). She re-started shots May of 2020 and reached maintenance in February of 2021.  Food Allergy Symptom History: She continues to avoid all of her triggering foods.  There have been no accidental exposures.  Her EpiPen is up-to-date.  Eczema Symptom History: She continues to use all of her prescription steroid ointments.  Overall, her skin is fairly well controlled.  She has not needed systemic antibiotics or steroids since last visit.   She does not need to have another cataract surgery. She is on  Humira every two weeks which she gives to herself. She is down on all of her eye drops. She receives both a cyclosporine and an azathioprine eye drop.   Otherwise, there have been no changes to her past medical history, surgical history, family history, or social history.    Review of Systems  Constitutional: Negative.  Negative for chills, fever, malaise/fatigue and weight loss.  HENT: Negative.  Negative for congestion, ear discharge and ear pain.   Eyes: Negative for pain, discharge and redness.  Respiratory: Positive for shortness of breath and wheezing. Negative for cough and sputum production.   Cardiovascular: Negative.  Negative for chest pain and palpitations.  Gastrointestinal: Negative for abdominal pain, diarrhea, heartburn, nausea and vomiting.  Skin: Negative.  Negative for itching and rash.  Neurological: Negative for dizziness and headaches.  Endo/Heme/Allergies: Negative for environmental allergies. Does not bruise/bleed easily.       Objective:   Blood pressure 128/82, pulse 96, temperature (!) 97.3 F (36.3 C), resp. rate 16, height 5\' 9"  (1.753 m), weight (!) 476 lb 9.6 oz (216.2 kg), SpO2 98 %. Body mass index is 70.38 kg/m.   Physical Exam:  Physical Exam Constitutional:      Appearance: She is well-developed.     Comments: Pleasant female.  HENT:     Head: Normocephalic and atraumatic.     Right Ear:  Tympanic membrane, ear canal and external ear normal.     Left Ear: Tympanic membrane, ear canal and external ear normal.     Nose: No nasal deformity, septal deviation, mucosal edema, rhinorrhea or epistaxis.     Right Turbinates: Enlarged and swollen.     Left Turbinates: Enlarged and swollen.     Right Sinus: No maxillary sinus tenderness or frontal sinus tenderness.     Left Sinus: No maxillary sinus tenderness or frontal sinus tenderness.     Comments: No polyps.  Clear discharge.    Mouth/Throat:     Mouth: Oropharynx is clear and moist. Mucous  membranes are not pale and not dry.     Pharynx: Uvula midline.  Eyes:     General:        Right eye: No discharge.        Left eye: No discharge.     Extraocular Movements: EOM normal.     Conjunctiva/sclera: Conjunctivae normal.     Right eye: Right conjunctiva is not injected. No chemosis.    Left eye: Left conjunctiva is not injected. No chemosis.    Pupils: Pupils are equal, round, and reactive to light.  Cardiovascular:     Rate and Rhythm: Normal rate and regular rhythm.     Heart sounds: Normal heart sounds.  Pulmonary:     Effort: Pulmonary effort is normal. No tachypnea, accessory muscle usage or respiratory distress.     Breath sounds: Normal breath sounds. No wheezing, rhonchi or rales.     Comments: Moving air well in all lung fields.  No increased work of breathing. Chest:     Chest wall: No tenderness.  Lymphadenopathy:     Cervical: No cervical adenopathy.  Skin:    Coloration: Skin is not pale.     Findings: No abrasion, erythema, petechiae or rash. Rash is not papular, urticarial or vesicular.     Comments: No eczematous or urticarial lesions noted.  Neurological:     Mental Status: She is alert.  Psychiatric:        Mood and Affect: Mood and affect normal.        Behavior: Behavior is cooperative.      Diagnostic studies:    Spirometry: results normal (FEV1: 2.35/70%, FVC: 2.84/74%, FEV1/FVC: 83%).    Spirometry consistent with normal pattern.   Allergy Studies: none       Malachi Bonds, MD  [facility]

## 2020-03-15 ENCOUNTER — Encounter: Payer: Self-pay | Admitting: Allergy & Immunology

## 2020-03-19 ENCOUNTER — Ambulatory Visit (INDEPENDENT_AMBULATORY_CARE_PROVIDER_SITE_OTHER): Payer: Medicare Other

## 2020-03-19 DIAGNOSIS — J309 Allergic rhinitis, unspecified: Secondary | ICD-10-CM

## 2020-04-01 ENCOUNTER — Ambulatory Visit (INDEPENDENT_AMBULATORY_CARE_PROVIDER_SITE_OTHER): Payer: Medicare Other

## 2020-04-01 DIAGNOSIS — J309 Allergic rhinitis, unspecified: Secondary | ICD-10-CM | POA: Diagnosis not present

## 2020-04-10 ENCOUNTER — Ambulatory Visit (INDEPENDENT_AMBULATORY_CARE_PROVIDER_SITE_OTHER): Payer: Medicare Other | Admitting: *Deleted

## 2020-04-10 DIAGNOSIS — J309 Allergic rhinitis, unspecified: Secondary | ICD-10-CM

## 2020-04-23 ENCOUNTER — Ambulatory Visit (INDEPENDENT_AMBULATORY_CARE_PROVIDER_SITE_OTHER): Payer: Medicare Other

## 2020-04-23 DIAGNOSIS — J309 Allergic rhinitis, unspecified: Secondary | ICD-10-CM | POA: Diagnosis not present

## 2020-05-11 ENCOUNTER — Ambulatory Visit (INDEPENDENT_AMBULATORY_CARE_PROVIDER_SITE_OTHER): Payer: Medicare Other | Admitting: *Deleted

## 2020-05-11 DIAGNOSIS — J309 Allergic rhinitis, unspecified: Secondary | ICD-10-CM

## 2020-05-20 ENCOUNTER — Ambulatory Visit (INDEPENDENT_AMBULATORY_CARE_PROVIDER_SITE_OTHER): Payer: Medicare Other

## 2020-05-20 DIAGNOSIS — J309 Allergic rhinitis, unspecified: Secondary | ICD-10-CM

## 2020-06-10 ENCOUNTER — Ambulatory Visit (INDEPENDENT_AMBULATORY_CARE_PROVIDER_SITE_OTHER): Payer: Medicare Other

## 2020-06-10 DIAGNOSIS — J309 Allergic rhinitis, unspecified: Secondary | ICD-10-CM | POA: Diagnosis not present

## 2020-06-19 ENCOUNTER — Ambulatory Visit (INDEPENDENT_AMBULATORY_CARE_PROVIDER_SITE_OTHER): Payer: Medicare Other | Admitting: *Deleted

## 2020-06-19 DIAGNOSIS — J309 Allergic rhinitis, unspecified: Secondary | ICD-10-CM | POA: Diagnosis not present

## 2020-06-29 ENCOUNTER — Ambulatory Visit (INDEPENDENT_AMBULATORY_CARE_PROVIDER_SITE_OTHER): Payer: Medicare Other | Admitting: *Deleted

## 2020-06-29 DIAGNOSIS — J309 Allergic rhinitis, unspecified: Secondary | ICD-10-CM | POA: Diagnosis not present

## 2020-06-30 ENCOUNTER — Telehealth: Payer: Self-pay | Admitting: *Deleted

## 2020-06-30 MED ORDER — CETIRIZINE HCL 10 MG PO TABS: 10.0000 mg | ORAL_TABLET | Freq: Every day | ORAL | 1 refills | Status: DC

## 2020-06-30 MED ORDER — MONTELUKAST SODIUM 10 MG PO TABS: 10.0000 mg | ORAL_TABLET | Freq: Every day | ORAL | 5 refills | Status: DC

## 2020-06-30 MED ORDER — LEVOCETIRIZINE DIHYDROCHLORIDE 5 MG PO TABS: 5.0000 mg | ORAL_TABLET | Freq: Every evening | ORAL | 5 refills | Status: DC

## 2020-06-30 NOTE — Telephone Encounter (Signed)
Called and spoke to patient and informed her that some insurances will deny OTC medications and that there is nothing could do but ask for a different prescription. I got a verbal from Dr. Dellis Anes to switch to Xyzal and see if that will go through [employer].  Patient asked for a refill on singular. Both medications have been sent to patient's pharmacy.

## 2020-06-30 NOTE — Telephone Encounter (Signed)
Patient states that the pharmacy told her that this an over the counter medication, not a prescription and she needs something that [facility] and [facility] covers. Informed patient that the medication was called in as a prescription and she states again the pharmacy told her it was an over the counter medication.    Please advise.

## 2020-06-30 NOTE — Addendum Note (Signed)
Addended by: Robet Leu A on: 06/30/2020 02:11 PM   Modules accepted: Orders

## 2020-07-07 ENCOUNTER — Ambulatory Visit (INDEPENDENT_AMBULATORY_CARE_PROVIDER_SITE_OTHER): Payer: Medicare Other | Admitting: *Deleted

## 2020-07-07 DIAGNOSIS — J309 Allergic rhinitis, unspecified: Secondary | ICD-10-CM | POA: Diagnosis not present

## 2020-07-13 ENCOUNTER — Ambulatory Visit (INDEPENDENT_AMBULATORY_CARE_PROVIDER_SITE_OTHER): Payer: Medicare Other | Admitting: Family Medicine

## 2020-07-13 ENCOUNTER — Other Ambulatory Visit: Payer: Self-pay

## 2020-07-13 VITALS — BP 128/60 | HR 78 | Wt >= 6400 oz

## 2020-07-13 DIAGNOSIS — Z1159 Encounter for screening for other viral diseases: Secondary | ICD-10-CM

## 2020-07-13 DIAGNOSIS — Z0000 Encounter for general adult medical examination without abnormal findings: Secondary | ICD-10-CM

## 2020-07-13 DIAGNOSIS — Z23 Encounter for immunization: Secondary | ICD-10-CM | POA: Diagnosis not present

## 2020-07-13 DIAGNOSIS — K5909 Other constipation: Secondary | ICD-10-CM | POA: Diagnosis not present

## 2020-07-13 MED ORDER — POLYETHYLENE GLYCOL 3350 17 GM/SCOOP PO POWD: 38.0000 g | Freq: Two times a day (BID) | ORAL | 5 refills | Status: DC

## 2020-07-13 NOTE — Progress Notes (Signed)
      SUBJECTIVE:   Chief compliant/HPI: annual examination  MARIELLA BLACKWELDER is a 25 y.o. who presents today for an annual exam.   Review of systems form notable for constipation.   Updated history tabs and problem list.   [place] denies smoking, drinking alcohol and illicit drug use.  Denies sexual activity remains abstinent.      OBJECTIVE:   BP 128/60   Pulse 78   Wt (!) 487 lb 3.2 oz (221 kg)   SpO2 97%   BMI 71.95 kg/m    GEN:     alert, well appearing and no distress    HENT:  mucus membranes moist, oropharyngeal without lesions or erythema,  nares patent, no nasal discharge  EYES:   pupils equal and reactive, EOM intact NECK:  supple, normal ROM RESP:  clear to auscultation bilaterally, no increased work of breathing  CVS:   regular rate and rhythm, no murmur, distal pulses intact   ABD:  soft, non-tender; bowel sounds present; no palpable masses PELVIC: deferred  EXT:   normal ROM, atraumatic, no edema NEURO:  alert and oriented, no abnormal coordination, gross sensation intact  Skin:   warm and dry    ASSESSMENT/PLAN:   No problem-specific Assessment & Plan notes found for this encounter.    Annual Examination  See AVS for age appropriate recommendations.   PHQ score 2, reviewed and discussed. Blood pressure reviewed and at goal.  The patient currently uses nothing for contraception as she reports being abstinent. Folate recommended as appropriate, minimum of 400 mcg per day.    Considered the following items based upon USPSTF recommendations: HIV testing: discussed and declined.  Hepatitis C: ordered and declined.  Hepatitis B: discussed Syphilis if at high risk: discussed and not indicated as she is not sexually active.  GC/CT not at high risk and not ordered. Lipid panel (nonfasting or fasting) discussed based upon AHA recommendations and ordered.  Consider repeat every 4-6 years.  Reviewed risk factors for latent tuberculosis and not  indicated  Discussed family history, BRCA testing not indicated.  Cervical cancer screening: discussed and declined.  Immunizations UTD.  Pt to mychart COVID vaccine card.   Follow up in 1 year or sooner if indicated.    Lyndee Hensen, [place]

## 2020-07-13 NOTE — Patient Instructions (Signed)
Today at your annual preventive visit we talked about the following measures:  It was great seeing you today!  Please check-out at the front desk before leaving the clinic. I'd like to see you back in 1 year but if you need to be seen earlier than that for any new issues we're happy to fit you in, just give [place] a call!  Visit Remembers: - Stop by the pharmacy to pick up your prescriptions  - Continue to work on your healthy eating habits and incorporating exercise into your daily life.    I recommend 150 minutes of exercise per week-try 30 minutes 5 days per week We discussed reducing sugary beverages (like soda and juice) and increasing leafy greens and whole fruits.  We discussed avoiding tobacco and alcohol.  I recommend avoiding illicit substances.  Your blood pressure is 128/60 at goal of <120/80    Regarding lab work today:  Due to recent changes in healthcare laws, you may see the results of your imaging and laboratory studies on MyChart before your provider has had a chance to review them.  I understand that in some cases there may be results that are confusing or concerning to you. Not all laboratory results come back in the same time frame and you may be waiting for multiple results in order to interpret others.  Please give [place] 72 hours in order for your provider to thoroughly review all the results before contacting the office for clarification of your results. If everything is normal, you will get a letter in the mail or a message in My Chart. Please give [place] a call if you do not hear from [place] after 2 weeks.  Please bring all of your medications with you to each visit.    If you haven't already, sign up for My Chart to have easy access to your labs results, and communication with your primary care physician.  Feel free to call with any questions or concerns at any time, at [phone].   Take care,  Dr. Katherina [facility]

## 2020-07-14 LAB — LIPID PANEL
Chol/HDL Ratio: 4.8 ratio — ABNORMAL HIGH (ref 0.0–4.4)
Cholesterol, Total: 177 mg/dL (ref 100–199)
HDL: 37 mg/dL — ABNORMAL LOW (ref >39)
LDL Chol Calc (NIH): 119 mg/dL — ABNORMAL HIGH (ref 0–99)
Triglycerides: 118 mg/dL (ref 0–149)
VLDL Cholesterol Cal: 21 mg/dL (ref 5–40)

## 2020-07-14 LAB — HEPATITIS C ANTIBODY: Hep C Virus Ab: <0.1 s/co ratio (ref 0.0–0.9)

## 2020-07-15 ENCOUNTER — Encounter: Payer: Self-pay | Admitting: Family Medicine

## 2020-07-21 NOTE — Progress Notes (Signed)
VIALS EXP 07-21-21 

## 2020-07-22 ENCOUNTER — Ambulatory Visit (INDEPENDENT_AMBULATORY_CARE_PROVIDER_SITE_OTHER): Payer: Medicare Other

## 2020-07-22 DIAGNOSIS — J309 Allergic rhinitis, unspecified: Secondary | ICD-10-CM

## 2020-07-23 DIAGNOSIS — J3089 Other allergic rhinitis: Secondary | ICD-10-CM

## 2020-07-24 DIAGNOSIS — J301 Allergic rhinitis due to pollen: Secondary | ICD-10-CM | POA: Diagnosis not present

## 2020-07-29 ENCOUNTER — Ambulatory Visit (INDEPENDENT_AMBULATORY_CARE_PROVIDER_SITE_OTHER): Payer: Medicare Other

## 2020-07-29 DIAGNOSIS — J309 Allergic rhinitis, unspecified: Secondary | ICD-10-CM

## 2020-07-30 ENCOUNTER — Telehealth: Payer: Self-pay | Admitting: Allergy & Immunology

## 2020-07-30 MED ORDER — BENZONATATE 100 MG PO CAPS: 100.0000 mg | ORAL_CAPSULE | Freq: Three times a day (TID) | ORAL | 0 refills | Status: DC | PRN

## 2020-07-30 MED ORDER — PREDNISONE 10 MG PO TABS: ORAL_TABLET | ORAL | 0 refills | Status: DC

## 2020-07-30 NOTE — Telephone Encounter (Signed)
Patient is calling back to follow up on her message.

## 2020-07-30 NOTE — Telephone Encounter (Signed)
Pt states recently, due to allergies & stuffy nose she believes she has formed an irritation in throat. She states she has had a cough now that causes her discomfort at night. Pt can't sleep & is coughing up phlegm, pt is taking Cetirizine and using her azelastine nose spray but it is not helping as well as gargling salt water and taking tylenol to help with discomfort. Pt would like to know if anything could be sent in for cough & to help soothe throat. ([facility] [address])   Please advise.

## 2020-07-30 NOTE — Addendum Note (Signed)
Addended by: Dub Mikes on: 07/30/2020 03:21 PM   Modules accepted: Orders

## 2020-07-30 NOTE — Telephone Encounter (Signed)
Please advise 

## 2020-07-30 NOTE — Telephone Encounter (Signed)
I sent in a short prednisone course.  I also sent in [facility].  Please let the patient know.  Malachi Bonds, MD [facility]

## 2020-07-30 NOTE — Telephone Encounter (Signed)
Called patient and left a message that prednisone and tessalon perles was sent in to their preferred pharmacy by Dr.Gallagher. I told them to call back with an questions or concerns

## 2020-07-31 NOTE — Telephone Encounter (Signed)
Sorry I was the moron who sent the meds to the wrong pharmacy... after you told me the right pharmacy in the message. Sorry!   Malachi Bonds, MD [facility]

## 2020-08-06 ENCOUNTER — Ambulatory Visit (INDEPENDENT_AMBULATORY_CARE_PROVIDER_SITE_OTHER): Payer: Medicare Other | Admitting: *Deleted

## 2020-08-06 DIAGNOSIS — J309 Allergic rhinitis, unspecified: Secondary | ICD-10-CM | POA: Diagnosis not present

## 2020-08-21 ENCOUNTER — Ambulatory Visit (INDEPENDENT_AMBULATORY_CARE_PROVIDER_SITE_OTHER): Payer: Medicare Other | Admitting: *Deleted

## 2020-08-21 DIAGNOSIS — J309 Allergic rhinitis, unspecified: Secondary | ICD-10-CM

## 2020-08-25 ENCOUNTER — Ambulatory Visit: Payer: Medicare Other | Attending: Critical Care Medicine

## 2020-08-25 DIAGNOSIS — Z20822 Contact with and (suspected) exposure to covid-19: Secondary | ICD-10-CM

## 2020-08-26 LAB — NOVEL CORONAVIRUS, NAA: SARS-CoV-2, NAA: NOT DETECTED

## 2020-08-26 LAB — SARS-COV-2, NAA 2 DAY TAT

## 2020-08-28 ENCOUNTER — Ambulatory Visit (INDEPENDENT_AMBULATORY_CARE_PROVIDER_SITE_OTHER): Payer: Medicare Other

## 2020-08-28 DIAGNOSIS — J309 Allergic rhinitis, unspecified: Secondary | ICD-10-CM | POA: Diagnosis not present

## 2020-09-08 ENCOUNTER — Ambulatory Visit (INDEPENDENT_AMBULATORY_CARE_PROVIDER_SITE_OTHER): Payer: Medicare Other | Admitting: *Deleted

## 2020-09-08 ENCOUNTER — Ambulatory Visit: Payer: Medicare Other | Admitting: Allergy & Immunology

## 2020-09-08 DIAGNOSIS — J309 Allergic rhinitis, unspecified: Secondary | ICD-10-CM | POA: Diagnosis not present

## 2020-09-16 ENCOUNTER — Ambulatory Visit (INDEPENDENT_AMBULATORY_CARE_PROVIDER_SITE_OTHER): Payer: Medicare Other

## 2020-09-16 DIAGNOSIS — J309 Allergic rhinitis, unspecified: Secondary | ICD-10-CM | POA: Diagnosis not present

## 2020-10-01 ENCOUNTER — Ambulatory Visit (INDEPENDENT_AMBULATORY_CARE_PROVIDER_SITE_OTHER): Payer: Medicare Other | Admitting: *Deleted

## 2020-10-01 DIAGNOSIS — J309 Allergic rhinitis, unspecified: Secondary | ICD-10-CM

## 2020-10-09 ENCOUNTER — Ambulatory Visit (INDEPENDENT_AMBULATORY_CARE_PROVIDER_SITE_OTHER): Payer: Medicare Other

## 2020-10-09 DIAGNOSIS — J309 Allergic rhinitis, unspecified: Secondary | ICD-10-CM | POA: Diagnosis not present

## 2020-10-23 ENCOUNTER — Ambulatory Visit (INDEPENDENT_AMBULATORY_CARE_PROVIDER_SITE_OTHER): Payer: Medicare Other

## 2020-10-23 DIAGNOSIS — J309 Allergic rhinitis, unspecified: Secondary | ICD-10-CM

## 2020-11-05 ENCOUNTER — Ambulatory Visit (INDEPENDENT_AMBULATORY_CARE_PROVIDER_SITE_OTHER): Payer: Medicare Other | Admitting: *Deleted

## 2020-11-05 DIAGNOSIS — J309 Allergic rhinitis, unspecified: Secondary | ICD-10-CM | POA: Diagnosis not present

## 2020-11-13 ENCOUNTER — Ambulatory Visit (INDEPENDENT_AMBULATORY_CARE_PROVIDER_SITE_OTHER): Payer: Medicare Other

## 2020-11-13 DIAGNOSIS — J309 Allergic rhinitis, unspecified: Secondary | ICD-10-CM | POA: Diagnosis not present

## 2020-11-17 NOTE — Progress Notes (Signed)
VIALS MADE. EXP 11-17-21 

## 2020-11-18 DIAGNOSIS — J301 Allergic rhinitis due to pollen: Secondary | ICD-10-CM | POA: Diagnosis not present

## 2020-11-19 ENCOUNTER — Ambulatory Visit (INDEPENDENT_AMBULATORY_CARE_PROVIDER_SITE_OTHER): Payer: Medicare Other | Admitting: *Deleted

## 2020-11-19 DIAGNOSIS — J309 Allergic rhinitis, unspecified: Secondary | ICD-10-CM | POA: Diagnosis not present

## 2020-11-23 DIAGNOSIS — J3089 Other allergic rhinitis: Secondary | ICD-10-CM

## 2020-11-27 ENCOUNTER — Ambulatory Visit (INDEPENDENT_AMBULATORY_CARE_PROVIDER_SITE_OTHER): Payer: Medicare Other

## 2020-11-27 DIAGNOSIS — J309 Allergic rhinitis, unspecified: Secondary | ICD-10-CM

## 2020-12-01 ENCOUNTER — Other Ambulatory Visit: Payer: Self-pay | Admitting: Family Medicine

## 2020-12-01 DIAGNOSIS — K5909 Other constipation: Secondary | ICD-10-CM

## 2020-12-04 ENCOUNTER — Ambulatory Visit (INDEPENDENT_AMBULATORY_CARE_PROVIDER_SITE_OTHER): Payer: Medicare Other

## 2020-12-04 DIAGNOSIS — J309 Allergic rhinitis, unspecified: Secondary | ICD-10-CM | POA: Diagnosis not present

## 2020-12-11 ENCOUNTER — Ambulatory Visit (INDEPENDENT_AMBULATORY_CARE_PROVIDER_SITE_OTHER): Payer: Medicare Other

## 2020-12-11 DIAGNOSIS — J309 Allergic rhinitis, unspecified: Secondary | ICD-10-CM

## 2020-12-23 ENCOUNTER — Ambulatory Visit (INDEPENDENT_AMBULATORY_CARE_PROVIDER_SITE_OTHER): Payer: Medicare Other | Admitting: *Deleted

## 2020-12-23 DIAGNOSIS — J309 Allergic rhinitis, unspecified: Secondary | ICD-10-CM | POA: Diagnosis not present

## 2021-01-01 ENCOUNTER — Ambulatory Visit (INDEPENDENT_AMBULATORY_CARE_PROVIDER_SITE_OTHER): Payer: Medicare Other

## 2021-01-01 DIAGNOSIS — J309 Allergic rhinitis, unspecified: Secondary | ICD-10-CM | POA: Diagnosis not present

## 2021-01-06 ENCOUNTER — Telehealth: Payer: Self-pay | Admitting: Allergy & Immunology

## 2021-01-06 ENCOUNTER — Other Ambulatory Visit: Payer: Self-pay

## 2021-01-06 ENCOUNTER — Encounter (HOSPITAL_COMMUNITY): Payer: Self-pay

## 2021-01-06 ENCOUNTER — Ambulatory Visit (HOSPITAL_COMMUNITY)
Admission: EM | Admit: 2021-01-06 | Discharge: 2021-01-06 | Disposition: A | Discharge status: Home / Self Care | Payer: Medicare Other | Attending: Internal Medicine | Admitting: Internal Medicine

## 2021-01-06 ENCOUNTER — Ambulatory Visit (INDEPENDENT_AMBULATORY_CARE_PROVIDER_SITE_OTHER): Payer: Medicare Other

## 2021-01-06 DIAGNOSIS — J309 Allergic rhinitis, unspecified: Secondary | ICD-10-CM | POA: Diagnosis not present

## 2021-01-06 DIAGNOSIS — H6121 Impacted cerumen, right ear: Secondary | ICD-10-CM | POA: Diagnosis not present

## 2021-01-06 NOTE — Telephone Encounter (Signed)
Patient states she has ear pressure that will not go away and has been going on for a while. Patient has tried every home remedy to make the pressure go away, she is not in pain just very uncomfortable. I did offer patient an office visit to get her ears checked on for tomorrow but unfortunately she will not be in town. Patient is unsure of what else she can do to help her ears pop so that the pressure goes away.   Best contact number: [phone]

## 2021-01-06 NOTE — Discharge Instructions (Addendum)
As we discussed, your fullness was probably from the wax that was in your ear.  We cleaned it out, your eardrum looked fine behind it.  You can use Debrox drops that you can get over-the-counter at the pharmacy to help prevent hard wax from building up again.  Do not use Q-tips or put anything in your ear.  You can use a damp washcloth on the outside of your ear or follow-up with your regular doctor for ear cleanings if needed.

## 2021-01-06 NOTE — ED Provider Notes (Signed)
[facility]    CSN: [id number] Arrival date & time: 01/06/21  0909      History   Chief Complaint Chief Complaint  Patient presents with   Ear Fullness    HPI Tiffany Velasquez is a 25 y.o. female.   Right Ear fullness Started 4 days ago Otherwise feeling well No pain or discharge History of allergies and asthma, but no congestion, rhinorrhea, difficulty breathing No fevers Hasn't had this before   Past Medical History:  Diagnosis Date   Asthma    Eczema    Morbid obesity (HCC)     Patient Active Problem List   Diagnosis Date Noted   Iritis 07/26/2018   Seasonal and perennial allergic rhinitis 07/26/2018   Acute upper respiratory infection 06/28/2017   Mild persistent asthma, uncomplicated 06/15/2017   Acute atopic conjunctivitis of left eye 06/15/2017   Adverse food reaction 06/15/2017   Moderate persistent asthma without complication 01/16/2017   Intrinsic atopic dermatitis 01/16/2017   Low back pain 12/22/2015   Allergic rhinoconjunctivitis 12/06/2014   Food allergy 12/06/2014   Chalazion of right upper eyelid 09/18/2014   Healthcare maintenance 04/12/2014   Asthma, chronic 06/22/2012   Elevated blood pressure reading without diagnosis of hypertension 06/22/2012   Family history of sudden cardiac death 08-18-2011   Morbid obesity (HCC) 10/05/2009   HALLUCINATIONS 10/05/2009   CONSTIPATION, CHRONIC 04/16/2008   ENCOPRESIS 05/25/2006   ATTENTION DEFICIT, W/HYPERACTIVITY 05/25/2006   ALLERGIC CONJUNCTIVITIS 05/25/2006   Allergic rhinitis 05/25/2006   Severe eczema 05/25/2006    Past Surgical History:  Procedure Laterality Date   CATARACT EXTRACTION     no past surgery      OB History   No obstetric history on file.      Home Medications    Prior to Admission medications   Medication Sig Start Date End Date Taking? Authorizing Provider  Adalimumab 40 MG/0.4ML PNKT Inject into the skin. 03/03/20   [provider]   albuterol (PROAIR HFA) 108 (90 Base) MCG/ACT inhaler Inhale 2 puffs into the lungs every 6 (six) hours as needed for wheezing or shortness of breath. 03/12/20   Alfonse Spruce, MD  azelastine (ASTELIN) 0.1 % nasal spray Use 2 sprays in each nostril twice daily if needed 03/12/20   Alfonse Spruce, MD  benzonatate (TESSALON) 100 MG capsule Take 1 capsule (100 mg total) by mouth 3 (three) times daily as needed for cough. 07/30/20   Alfonse Spruce, MD  bimatoprost (LUMIGAN) 0.01 % SOLN Place 1 drop into both eyes nightly. 03/03/20 03/03/21  [provider]  brimonidine (ALPHAGAN) 0.15 % ophthalmic solution 1 drop 3 (three) times daily. 02/01/20   [provider]  budesonide-formoterol (SYMBICORT) 160-4.5 MCG/ACT inhaler Inhale 2 puffs into the lungs 2 (two) times daily. 03/12/20   Alfonse Spruce, MD  cetirizine (ZYRTEC) 10 MG tablet Take 1 tablet (10 mg total) by mouth daily. 06/30/20   Alfonse Spruce, MD  ciclopirox (LOPROX) 0.77 % cream APPLY 1 APPLICATION TO ACTIVE RASH ON BODY BID PRN 12/01/16   [provider]  clindamycin (CLEOCIN T) 1 % lotion APPLY APPLICATION ON THE FACE D QAM 08/17/15   [provider]  clobetasol ointment (TEMOVATE) 0.05 % APPLY ON THE SKIN BID FOR ACTIVE ECZEMA 11/10/15   [provider]  desonide (DESOWEN) 0.05 % ointment APP APPLICATION ON FACE BID AS DIRECTED 12/17/15   [provider]  fluocinonide cream (LIDEX) 0.05 %  01/22/14  [provider]  fluticasone (FLOVENT HFA) 110 MCG/ACT inhaler Inhale 2 puffs into the lungs 2 (two) times daily. 09/10/19   Alfonse Spruce, MD  halobetasol (ULTRAVATE) 0.05 % ointment APPLY TO RASH ON BODY BID PRF FLARES UTD 08/17/15   [provider]  hydrOXYzine (ATARAX/VISTARIL) 10 MG tablet Take 1 tablet (10 mg total) by mouth daily as needed. 01/16/17   Alfonse Spruce, MD  ketoconazole (NIZORAL) 2 % cream APPLY 1 APPLICATION TO RASH  ON FACE BID PRN 12/01/16   [provider]  levocetirizine (XYZAL) 5 MG tablet Take 1 tablet (5 mg total) by mouth every evening. 06/30/20   Alfonse Spruce, MD  montelukast (SINGULAIR) 10 MG tablet Take 1 tablet (10 mg total) by mouth at bedtime. 06/30/20   Alfonse Spruce, MD  naproxen (NAPROSYN) 375 MG tablet Take 1 tablet (375 mg total) by mouth 2 (two) times daily with a meal. 09/03/17   Damian Leavell, [place], NP  pantoprazole (PROTONIX) 20 MG tablet Take 1 tablet (20 mg total) by mouth daily. 05/09/19   Caccavale, Sophia, PA-C  polyethylene glycol powder (GLYCOLAX/MIRALAX) 17 GM/SCOOP powder TAKE 2 CAPFULS TWICE DAILY EVERY MORNING AND EVERY NIGHT AT BEDTIME 12/01/20   Brimage, Vondra, DO  predniSONE (DELTASONE) 10 MG tablet Take two tablets (20mg ) twice daily for three days, then one tablet (10mg ) twice daily for three days, then STOP. 07/30/20   , MD  sucralfate (CARAFATE) 1 GM/10ML suspension Take 10 mLs (1 g total) by mouth 4 (four) times daily -  with meals and at bedtime. 05/09/19   Caccavale, Sophia, PA-C  tacrolimus (PROTOPIC) 0.1 % ointment APPLY TO RASH ON BODY BID 08/17/15   [provider]    Family History Family History  Problem Relation Age of Onset   Sudden death Cousin    Asthma Mother    Allergic rhinitis Father    Eczema Neg Hx    Urticaria Neg Hx     Social History Social History   Tobacco Use   Smoking status: Never   Smokeless tobacco: Never  Vaping Use   Vaping Use: Never used  Substance Use Topics   Alcohol use: No   Drug use: No     Allergies   Peanut-containing drug products, Shellfish allergy, Apple, Orange fruit [citrus], Peach flavor, Tomato, and Tree extract   Review of Systems Review of Systems  All other systems reviewed and are negative.   Physical Exam Triage Vital Signs ED Triage Vitals  Enc Vitals Group     BP 01/06/21 1106 140/79     Pulse Rate 01/06/21 1106 72     Resp 01/06/21 1106 17     Temp  01/06/21 1106 97.9 F (36.6 C)     Temp Source 01/06/21 1106 Oral     SpO2 01/06/21 1106 97 %     Weight --      Height --      Head Circumference --      Peak Flow --      Pain Score 01/06/21 1108 0     Pain Loc --      Pain Edu? --      Excl. in GC? --    No data found.  Updated Vital Signs BP 140/79 (BP Location: Right Arm)   Pulse 72   Temp 97.9 F (36.6 C) (Oral)   Resp 17   LMP 12/16/2020   SpO2 97%   Visual Acuity Right Eye Distance:  Left Eye Distance:   Bilateral Distance:    Right Eye Near:   Left Eye Near:    Bilateral Near:     Physical Exam Constitutional:      General: She is not in acute distress.    Appearance: Normal appearance. She is not ill-appearing or toxic-appearing.  HENT:     Head: Normocephalic and atraumatic.     Right Ear: There is impacted cerumen.     Left Ear: Tympanic membrane normal. There is no impacted cerumen.     Nose: Nose normal. No congestion or rhinorrhea.     Mouth/Throat:     Mouth: Mucous membranes are dry.     Pharynx: No oropharyngeal exudate or posterior oropharyngeal erythema.  Pulmonary:     Effort: Pulmonary effort is normal. No respiratory distress.  Musculoskeletal:     Cervical back: Normal range of motion and neck supple. No tenderness.  Lymphadenopathy:     Cervical: No cervical adenopathy.  Neurological:     Mental Status: She is alert and oriented to person, place, and time.     UC Treatments / Results  Labs (all labs ordered are listed, but only abnormal results are displayed) Labs Reviewed - No data to display  EKG   Radiology No results found.  Procedures Procedures (including critical care time)  Medications Ordered in UC Medications - No data to display  Initial Impression / Assessment and Plan / UC Course  I have reviewed the triage vital signs and the nursing notes.  Pertinent labs & imaging results that were available during my care of the patient were reviewed by me and  considered in my medical decision making (see chart for details).     Patient is a 25 year old female who presents with right ear fullness.  Impacted cerumen on visualization.  Otherwise exam unremarkable.  Ear was irrigated with wax removed and improvement in patient's symptoms.  TM visualized afterwards and was unremarkable, no signs of infection.  Advised that she can use Debrox drops as needed to help prevent buildup of hard wax, can also use a damp cloth on outside of her ear, advised against Q-tips.  Also advised that she can follow-up with her primary care provider for ear cleanings if needed.   Final Clinical Impressions(s) / UC Diagnoses   Final diagnoses:  Impacted cerumen of right ear     Discharge Instructions      As we discussed, your fullness was probably from the wax that was in your ear.  We cleaned it out, your eardrum looked fine behind it.  You can use Debrox drops that you can get over-the-counter at the pharmacy to help prevent hard wax from building up again.  Do not use Q-tips or put anything in your ear.  You can use a damp washcloth on the outside of your ear or follow-up with your regular doctor for ear cleanings if needed.     ED Prescriptions   None    PDMP not reviewed this encounter.   Unknown Jim, DO 01/06/21 1158

## 2021-01-06 NOTE — ED Triage Notes (Signed)
Pt presents with bilateral ear fullness X 2 days. 

## 2021-01-06 NOTE — Telephone Encounter (Signed)
Patient in today for her allergy shot today and stated we can disregard the message she left this morning as she has been seen at the urgent care and was taken care of.

## 2021-01-07 NOTE — Telephone Encounter (Signed)
Sounds good. Thanks!   Alverta Caccamo, MD [facility]  

## 2021-01-26 ENCOUNTER — Ambulatory Visit (INDEPENDENT_AMBULATORY_CARE_PROVIDER_SITE_OTHER): Payer: Medicare Other | Admitting: *Deleted

## 2021-01-26 ENCOUNTER — Telehealth: Payer: Self-pay

## 2021-01-26 DIAGNOSIS — J309 Allergic rhinitis, unspecified: Secondary | ICD-10-CM | POA: Diagnosis not present

## 2021-01-26 MED ORDER — EPINEPHRINE 0.3 MG/0.3ML IJ SOAJ: 0.3000 mg | Freq: Once | INTRAMUSCULAR | 1 refills | Status: AC

## 2021-01-26 NOTE — Progress Notes (Addendum)
Patient came in today with a reaction at approximately 1:56 pm. Patient was with her mother and mother expressed that she was having issues with severe red eyes, difficulty breathing, tingling all over her body especially her lips.  Vitals were taken and they were slightly elevated than her normal blood pressure. Patient was lethargic and expressed that she felt weak. Patient was given 20 ml of Zyrtec and given epi per both providers(Dr. Lucie Leather and Dr. Dellis Anes.) Dr. Dellis Anes prescribed patient thirty mg of prednisone in office. Dr. Lucie Leather was the first provider to evaluate her in the injection room lobby. Apolonio Schneiders was the one to grab staff and ask for assistance. Patient was taken to room one for further evaluation. Patient was heavily monitored during her reaction. Patient did complain about stomach issues such as being upset and cramps. Patient went to restroom and had a bowel movement. Dr. Dellis Anes verbalized 40 mg of Pepcid and 4 mg of Zofran. Patient vitals have since normalized since the Pepcid and Zofran. Patient complains of being drowsy. Patient  expressed that she has been stressed and has had lack of sleep due to projects and exams in school patient states that she graduates in January so she is under lots of pressure and stress. Patient states that she is feeling fine. All symptoms that she came in with have subsided with the help of the medications and epi. Patient scheduled an appointment with Dr. Dellis Anes for next week. Patient was prescribed a baby pack of prednisone to take home for the next four days. Patient seems to be stable around 3:30 pm. Dr. Dellis Anes did come check on patient for a final evaluation. Patient asked for an epipen and Dr. Dellis Anes verbally expressed it was ok to refill.

## 2021-01-26 NOTE — Addendum Note (Signed)
Addended by: Robet Leu A on: 01/26/2021 07:06 PM   Modules accepted: Orders

## 2021-01-26 NOTE — Telephone Encounter (Signed)
Called and spoke to patients mother to check on patient. I did try to contact patient but was sent to her voice mail. Mom expressed that patient was doing well and had no complications since they left our office. Mom expressed that she has consumed solids and liquids with no problem. I informed mom to call our office with any issues or concerns throughout the night as we have a physician on call nightly. Mom verbalized understanding and agreed to call with any issues. I informed mo that I would call in the morning when I arrived to work. Mom expressed that they would still be asleep and it would be better to call around ten thirty in the morning. I agreed to cal at that time. No other questions or concerns were addressed during the time of our call.

## 2021-01-27 ENCOUNTER — Ambulatory Visit (INDEPENDENT_AMBULATORY_CARE_PROVIDER_SITE_OTHER): Payer: Medicare Other | Admitting: Allergy

## 2021-01-27 ENCOUNTER — Encounter: Payer: Self-pay | Admitting: Allergy

## 2021-01-27 ENCOUNTER — Ambulatory Visit: Payer: Medicare Other

## 2021-01-27 ENCOUNTER — Other Ambulatory Visit: Payer: Self-pay

## 2021-01-27 VITALS — BP 130/82 | HR 91 | Temp 97.8°F | Resp 18 | Ht 69.0 in | Wt >= 6400 oz

## 2021-01-27 DIAGNOSIS — J4541 Moderate persistent asthma with (acute) exacerbation: Secondary | ICD-10-CM | POA: Diagnosis not present

## 2021-01-27 DIAGNOSIS — T450X5A Adverse effect of antiallergic and antiemetic drugs, initial encounter: Secondary | ICD-10-CM

## 2021-01-27 DIAGNOSIS — J302 Other seasonal allergic rhinitis: Secondary | ICD-10-CM

## 2021-01-27 DIAGNOSIS — J3089 Other allergic rhinitis: Secondary | ICD-10-CM | POA: Diagnosis not present

## 2021-01-27 DIAGNOSIS — L2089 Other atopic dermatitis: Secondary | ICD-10-CM | POA: Diagnosis not present

## 2021-01-27 DIAGNOSIS — T7800XD Anaphylactic reaction due to unspecified food, subsequent encounter: Secondary | ICD-10-CM

## 2021-01-27 MED ORDER — FLOVENT HFA 110 MCG/ACT IN AERO: 2.0000 | INHALATION_SPRAY | Freq: Two times a day (BID) | RESPIRATORY_TRACT | 5 refills | Status: DC

## 2021-01-27 NOTE — Progress Notes (Signed)
Follow-up Note  RE: Tiffany Velasquez MRN: [id number] DOB: 22-Jun-1995 Date of Office Visit: 01/27/2021   History of present illness: Tiffany Velasquez is a 25 y.o. female presenting today for sick visit.  She has history of asthma, allergic rhinitis, atopic dermatitis, food allergy.  She was last seen for an office visit by Dr. Dellis Anes on 03/12/2020.  She is on immunotherapy. She came for her routine allergy shot yesterday and afterwards she started to feel itchy and the itching started to build and felt like the itch was in places she could reach.   She then felt like her heart was pumping fast.  She then states her voice was getting scratchy and felt like it was turning hard to talk and breathe.  She states her eyes became very red and were watery.  Then she felt like she was going to pass out.   No previous illnesses.   She took zyrtec yesterday morning.   She was brought back to an exam room yesterday, examined and treated for the symptoms with IM epi 0.3 mg, Zofran 4 mg, Pepcid 40 mg, cetirizine 20 mL, prednisone 30 mg.  After an observation.  She was doing better and able to discharge home.  She was sent home with a prednisone pack to take.  She is in school and graduating in January and thus she has been up finishing assignment and projects and thus has not been sleeping the best but states this is nothing new.  She doesn't feel any more stressed than she usually does.   She is not sure what may have triggered this reaction yesterday.  When she got home yesterday she was sleepy and she slept a lot.  She states she had more strength today.   She states her mother noted her wheezing today and she feels more short of breath today.  She has used albuterol twice today.  Thus she call the office and appointment was made.  She has been taking her Symbicort 2 puffs twice a day.  She does not have Flovent to add into her asthma action plan.  She otherwise states she has been using her other  routine medicines including her Singulair, cetirizine and nasal sprays.  Review of systems: Review of Systems  Constitutional:  Positive for malaise/fatigue.  HENT: Negative.    Eyes: Negative.   Respiratory:  Positive for shortness of breath and wheezing.   Cardiovascular: Negative.   Gastrointestinal: Negative.   Musculoskeletal: Negative.   Skin:  Positive for itching.  Neurological:        See HPI   All other systems negative unless noted above in HPI  Past medical/social/surgical/family history have been reviewed and are unchanged unless specifically indicated below.  No changes  Medication List: Current Outpatient Medications  Medication Sig Dispense Refill   Adalimumab 40 MG/0.4ML PNKT Inject into the skin.     albuterol (PROAIR HFA) 108 (90 Base) MCG/ACT inhaler Inhale 2 puffs into the lungs every 6 (six) hours as needed for wheezing or shortness of breath. 18 g 3   azelastine (ASTELIN) 0.1 % nasal spray Use 2 sprays in each nostril twice daily if needed 30 mL 5   benzonatate (TESSALON) 100 MG capsule Take 1 capsule (100 mg total) by mouth 3 (three) times daily as needed for cough. 20 capsule 0   bimatoprost (LUMIGAN) 0.01 % SOLN Place 1 drop into both eyes nightly.     brimonidine (ALPHAGAN) 0.15 % ophthalmic solution 1 drop  3 (three) times daily.     budesonide-formoterol (SYMBICORT) 160-4.5 MCG/ACT inhaler Inhale 2 puffs into the lungs 2 (two) times daily. 1 each 5   cetirizine (ZYRTEC) 10 MG tablet Take 1 tablet (10 mg total) by mouth daily. 90 tablet 1   ciclopirox (LOPROX) 0.77 % cream APPLY 1 APPLICATION TO ACTIVE RASH ON BODY BID PRN  1   clindamycin (CLEOCIN T) 1 % lotion APPLY APPLICATION ON THE FACE D QAM  3   clobetasol ointment (TEMOVATE) 0.05 % APPLY ON THE SKIN BID FOR ACTIVE ECZEMA  0   desonide (DESOWEN) 0.05 % ointment APP APPLICATION ON FACE BID AS DIRECTED  0   fluocinonide cream (LIDEX) 0.05 %   0   halobetasol (ULTRAVATE) 0.05 % ointment APPLY TO  RASH ON BODY BID PRF FLARES UTD  2   hydrOXYzine (ATARAX/VISTARIL) 10 MG tablet Take 1 tablet (10 mg total) by mouth daily as needed. 30 tablet 5   ketoconazole (NIZORAL) 2 % cream APPLY 1 APPLICATION TO RASH ON FACE BID PRN  1   levocetirizine (XYZAL) 5 MG tablet Take 1 tablet (5 mg total) by mouth every evening. 30 tablet 5   montelukast (SINGULAIR) 10 MG tablet Take 1 tablet (10 mg total) by mouth at bedtime. 30 tablet 5   naproxen (NAPROSYN) 375 MG tablet Take 1 tablet (375 mg total) by mouth 2 (two) times daily with a meal. 20 tablet 0   pantoprazole (PROTONIX) 20 MG tablet Take 1 tablet (20 mg total) by mouth daily. 14 tablet 0   polyethylene glycol powder (GLYCOLAX/MIRALAX) 17 GM/SCOOP powder TAKE 2 CAPFULS TWICE DAILY EVERY MORNING AND EVERY NIGHT AT BEDTIME 238 g 2   predniSONE (DELTASONE) 10 MG tablet Take two tablets (20mg ) twice daily for three days, then one tablet (10mg ) twice daily for three days, then STOP. 18 tablet 0   sucralfate (CARAFATE) 1 GM/10ML suspension Take 10 mLs (1 g total) by mouth 4 (four) times daily -  with meals and at bedtime. 420 mL 0   tacrolimus (PROTOPIC) 0.1 % ointment APPLY TO RASH ON BODY BID  1   FLOVENT HFA 110 MCG/ACT inhaler Inhale 2 puffs into the lungs 2 (two) times daily. 12 g 5   No current facility-administered medications for this visit.     Known medication allergies: Allergies  Allergen Reactions   Peanut-Containing Drug Products Swelling    Throat swelling   Shellfish Allergy Swelling    Throat swelling    Apple    Orange Fruit [Citrus]    Peach Flavor    Tomato    Tree Extract     ALLERGIC TO TREE NUTS     Physical examination: Blood pressure 130/82, pulse 91, temperature 97.8 F (36.6 C), temperature source Temporal, resp. rate 18, height 5\' 9"  (1.753 m), weight (!) 492 lb 8 oz (223.4 kg), SpO2 98 %.  General: Alert, interactive, in no acute distress.,  Talking in complete sentences. HEENT: PERRLA, TMs pearly gray,  turbinates moderately edematous without discharge, post-pharynx non erythematous. Neck: Supple without lymphadenopathy. Lungs: Clear to auscultation without wheezing, rhonchi or rales. {no increased work of breathing. CV: Normal S1, S2 without murmurs. Abdomen: Nondistended, nontender. Skin: Warm and dry, without lesions or rashes. Extremities:  No clubbing, cyanosis or edema. Neuro:   Grossly intact.  Diagnositics/Labs:  Spirometry: FEV1: 2.04 L 61%, FVC: 2.47 L 64% predicted.  This is a reduced lung function study consistent with a restrictive pattern  Assessment and plan:  1. Moderate persistent asthma with acute flare secondary to immunotherapy reaction - Lung testing looked slightly better today.  - We are going to increase to the higher Symbicort dose since you felt that this was working better for you.  - Daily controller medication(s): Symbicort 16/4.5 two puffs twice daily with spacer - Rescue medications: ProAir 4 puffs every 4-6 hours as needed - Changes during respiratory infections or as flare: add Flovent to 2 puffs twice daily for ONE TO TWO WEEKS -She will complete her prednisone course as provided - Asthma control goals:  * Full participation in all desired activities (may need albuterol before activity) * Albuterol use two time or less a week on average (not counting use with activity) * Cough interfering with sleep two time or less a month * Oral steroids no more than once a year * No hospitalizations  2. Chronic allergic rhinitis with acute flare related to immunotherapy reaction - Continue with Singulair, cetirizine, and Astelin.   - Continue with allergy shots at the same schedule.   3. Atopic dermatitis - Continue with moisturizing twice daily. - Continue with [employer].   - Continue with your medicated ointments as needed.   4. Multiple food allergies (peanuts, shellfish, multiple fruits/vegetables) - EpiPen is up to date.  - Continue  to avoid peanuts, tree nuts, and shellfish.    5. Keep appointment with Dr Dellis Anes next week.   I appreciate the opportunity to take part in [place] care. Please do not hesitate to contact me with questions.  Sincerely,   Margo Aye, MD Allergy/Immunology [facility]

## 2021-01-27 NOTE — Patient Instructions (Addendum)
1. Moderate persistent asthma  - Lung testing looked slightly better today.  - We are going to increase to the higher Symbicort dose since you felt that this was working better for you.  - Daily controller medication(s): Symbicort 16/4.5 two puffs twice daily with spacer - Rescue medications: ProAir 4 puffs every 4-6 hours as needed - Changes during respiratory infections or as flare: add Flovent to 2 puffs twice daily for ONE TO TWO WEEKS - Asthma control goals:  * Full participation in all desired activities (may need albuterol before activity) * Albuterol use two time or less a week on average (not counting use with activity) * Cough interfering with sleep two time or less a month * Oral steroids no more than once a year * No hospitalizations  2. Chronic allergic rhinitis  - Continue with Singulair, cetirizine, and Astelin.   - Continue with allergy shots at the same schedule.   3. Atopic dermatitis - Continue with moisturizing twice daily. - Continue with [employer].   - Continue with your medicated ointments as needed.   4. Multiple food allergies (peanuts, shellfish, multiple fruits/vegetables) - EpiPen is up to date.  - Continue to avoid peanuts, tree nuts, and shellfish.    5. Keep appointment with Dr Dellis Anes next week.

## 2021-01-27 NOTE — Telephone Encounter (Signed)
Patient called back today to express that since her reaction yesterday she has been short of breath. Patient wants to know if she can get a breathing treatment when she comes in for her appointment next week. I did speak with Dr. Dellis Anes who wanted to schedule patient for a face to face appointment in [place]. Patient has been scheduled to come in today to be seen by either Dr. Delorse Lek or Dr. Maurine Minister.

## 2021-01-28 NOTE — Telephone Encounter (Signed)
I am glad that she was able to be seen today. Thank you to Dr. Delorse Lek for working her in!   Malachi Bonds, MD [facility]

## 2021-02-02 ENCOUNTER — Ambulatory Visit (INDEPENDENT_AMBULATORY_CARE_PROVIDER_SITE_OTHER): Payer: Medicare Other | Admitting: Allergy & Immunology

## 2021-02-02 ENCOUNTER — Other Ambulatory Visit: Payer: Self-pay

## 2021-02-02 ENCOUNTER — Encounter: Payer: Self-pay | Admitting: Allergy & Immunology

## 2021-02-02 VITALS — BP 126/84 | HR 90 | Temp 97.2°F | Resp 18

## 2021-02-02 DIAGNOSIS — J309 Allergic rhinitis, unspecified: Secondary | ICD-10-CM | POA: Diagnosis not present

## 2021-02-02 DIAGNOSIS — T7800XD Anaphylactic reaction due to unspecified food, subsequent encounter: Secondary | ICD-10-CM | POA: Diagnosis not present

## 2021-02-02 DIAGNOSIS — L2089 Other atopic dermatitis: Secondary | ICD-10-CM

## 2021-02-02 DIAGNOSIS — J4541 Moderate persistent asthma with (acute) exacerbation: Secondary | ICD-10-CM | POA: Diagnosis not present

## 2021-02-02 DIAGNOSIS — L309 Dermatitis, unspecified: Secondary | ICD-10-CM

## 2021-02-02 DIAGNOSIS — J302 Other seasonal allergic rhinitis: Secondary | ICD-10-CM | POA: Diagnosis not present

## 2021-02-02 NOTE — Patient Instructions (Addendum)
1. Moderate persistent asthma  - Lung testing not done today. - We are going to increase to the higher Symbicort dose since you felt that this was working better for you.  - Daily controller medication(s): Symbicort 16/4.5 two puffs twice daily with spacer - Rescue medications: ProAir 4 puffs every 4-6 hours as needed - Changes during respiratory infections or as flare: add Flovent to 2 puffs twice daily for ONE TO TWO WEEKS - Asthma control goals:  * Full participation in all desired activities (may need albuterol before activity) * Albuterol use two time or less a week on average (not counting use with activity) * Cough interfering with sleep two time or less a month * Oral steroids no more than once a year * No hospitalizations  2. Chronic allergic rhinitis - with recent anaphylaxis to your allergy shots.  - Continue with Singulair, cetirizine, and Astelin.   - We are going to continue with the Red Vial and restart at 0.1 mL. - Make sure you take your antihistamine daily.  3. Atopic dermatitis - Continue with moisturizing twice daily. - Continue with [employer].   - Continue with your medicated ointments as needed.   4. Multiple food allergies (peanuts, shellfish, multiple fruits/vegetables) - EpiPen is up to date.  - Continue to avoid peanuts, tree nuts, and shellfish.    5. Return in about 3 months (around 05/05/2021).    Please inform us of any Emergency Department visits, hospitalizations, or changes in symptoms. Call us before going to the ED for breathing or allergy symptoms since we might be able to fit you in for a sick visit. Feel free to contact us anytime with any questions, problems, or concerns.  It was a pleasure to see you again today!  Websites that have reliable patient information: 1. American Academy of Asthma, Allergy, and Immunology: www.aaaai.org 2. Food Allergy Research and Education (FARE): foodallergy.org 3. Mothers of Asthmatics:  http://www.asthmacommunitynetwork.org 4. American College of Allergy, Asthma, and Immunology: www.acaai.org   COVID-19 Vaccine Information can be found at: [email or web address] For questions related to vaccine distribution or appointments, please email [email or web address] or call [phone].   We realize that you might be concerned about having an allergic reaction to the COVID19 vaccines. To help with that concern, WE ARE OFFERING THE COVID19 VACCINES IN OUR OFFICE! Ask the front desk for dates!     "Like" [place] on Facebook and Instagram for our latest updates!      A healthy democracy works best when [employer] participate! Make sure you are registered to vote! If you have moved or changed any of your contact information, you will need to get this updated before voting!  In some cases, you MAY be able to register to vote online: [email or web address]

## 2021-02-02 NOTE — Progress Notes (Signed)
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 Moderate persistent asthma  - Lung testing not done today. - We are going to increase to the higher Symbicort dose since you felt that this was working better for you.  - Daily controller medication(s): Symbicort 16/4.5 two puffs twice daily with spacer - Rescue medications: ProAir 4 puffs every 4-6 hours as needed - Changes during respiratory infections or as flare: add Flovent 115mcg to 2 puffs twice daily for ONE TO TWO WEEKS - Asthma control goals:  * Full participation in all desired activities (may need albuterol before activity) * Albuterol use two time or less a week on average (not counting use with activity) * Cough interfering with sleep two time or less a month * Oral steroids no more than once a year * No hospitalizations  2. Chronic allergic rhinitis - with recent anaphylaxis to your allergy shots.  - Continue with Singulair, cetirizine, and Astelin.   - We are going to continue with the Red Vial and restart at 0.1 mL. - Make sure you take your antihistamine daily.  3. Atopic dermatitis - Continue with moisturizing twice daily. - Continue with [employer].   - Continue with your medicated ointments as needed.   4. Multiple food allergies (peanuts, shellfish, multiple fruits/vegetables) - EpiPen is up to date.  - Continue to avoid peanuts, tree nuts, and shellfish.    5. Return in about 3  months (around 05/05/2021).     Subjective:   Tiffany Velasquez is a 25 y.o. female presenting today for follow up of  Chief Complaint  Patient presents with   Other    Here about her allergic reaction to her immunization and if he treatment was going to be modified.    Tiffany Velasquez has a history of the following: Patient Active Problem List   Diagnosis Date Noted   Iritis 07/26/2018   Seasonal and perennial allergic rhinitis 07/26/2018   Acute upper respiratory infection 06/28/2017   Mild persistent asthma, uncomplicated [id number]   Acute atopic conjunctivitis of left eye 06/15/2017   Adverse food reaction 06/15/2017   Moderate persistent asthma without complication 0000000   Intrinsic atopic dermatitis 01/16/2017   Low back pain 12/22/2015   Allergic rhinoconjunctivitis 12/06/2014   Food allergy 12/06/2014   Chalazion of right upper eyelid 09/18/2014   Healthcare maintenance 04/12/2014   Asthma, chronic 06/22/2012   Elevated blood pressure reading without diagnosis of hypertension 06/22/2012   Family history of sudden cardiac death 09-Aug-2011   Morbid obesity ([place]) 10/05/2009   HALLUCINATIONS 10/05/2009   CONSTIPATION, CHRONIC 04/16/2008   ENCOPRESIS 05/25/2006   ATTENTION DEFICIT, W/HYPERACTIVITY 05/25/2006   ALLERGIC CONJUNCTIVITIS 05/25/2006   Allergic rhinitis 05/25/2006   Severe eczema 05/25/2006    History obtained from: chart review and patient.  Tiffany Velasquez is a 25 y.o. female presenting for a follow up visit.  She actually had a reaction to an allergy shot around 1 week ago.  She came  in after leaving following the 30 minutes and was out of it and itching all over.  She also endorsed some weakness.  We ended up giving her epinephrine as well as Zofran and prednisone and famotidine.  She was monitored for period of 1 to 2 hours and was discharged in stable condition.  She came back the following day because of continued symptoms and saw Tiffany Velasquez.  Evidently she  looks fairly good then and reassurance was provided.  In the interim, she has done well.  She is back to her normal cognitive vivacious self.  She is interested in discussing what the neck steps are regarding her allergy shots.  For review, she got 0.4 mL of her Red Vial.  This was Red Vial #4 and she has never had a reaction like this in the past.  She reports that she is back to baseline.  She did take the prednisone that we gave her a discharge at the last visit.  She has had no other further symptoms.  She would like to continue with the allergy shots.  Asthma is under good control with the current regimen.  She has not required any prednisone or ER visits for her breathing.  She has not been on albuterol.  Overall, symptoms are under excellent control.  She is almost finished with her school.  She is getting a degree in childhood education.  She was to teach dedicated garment.  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  She remains on the Humira. She is on 3 eye drops for this and her pressure is improved. She is going to have some inflammation but it is not as continuous as normal.   Otherwise, there have been no changes to her past medical history, surgical history, family history, or social history.    Review of Systems  Constitutional: Negative.  Negative for chills, fever, malaise/fatigue and weight loss.  HENT:  Positive for congestion and sinus pain. Negative for ear discharge and ear pain.   Eyes:  Negative for pain, discharge and redness.  Respiratory:  Negative for cough, sputum production, shortness of breath and wheezing.   Cardiovascular: Negative.  Negative for chest pain and palpitations.  Gastrointestinal:  Negative for abdominal pain, constipation, diarrhea, heartburn, nausea and vomiting.  Skin: Negative.  Negative for itching and rash.  Neurological:  Negative for dizziness and headaches.  Endo/Heme/Allergies:  Positive for  environmental allergies. Does not bruise/bleed easily.      Objective:   Blood pressure 126/84, pulse 90, temperature (!) 97.2 F (36.2 C), temperature source Temporal, resp. rate 18, SpO2 99 %. There is no height or weight on file to calculate BMI.   Physical Exam:  Physical Exam Vitals reviewed.  Constitutional:      Appearance: She is well-developed.     Comments: Pleasant female. Talkative.   HENT:     Head: Normocephalic and atraumatic.     Right Ear: Tympanic membrane, ear canal and external ear normal.     Left Ear: Tympanic membrane, ear canal and external ear normal.     Nose: No nasal deformity, septal deviation, mucosal edema or rhinorrhea.     Right Turbinates: Enlarged, swollen and pale.     Left Turbinates: Enlarged and pale.     Right Sinus: No maxillary sinus tenderness or frontal sinus tenderness.     Left Sinus: No maxillary sinus tenderness or frontal sinus tenderness.  Comments: No polyps.  Clear discharge.    Mouth/Throat:     Mouth: Mucous membranes are not pale and not dry.     Pharynx: Uvula midline.  Eyes:     General:        Right eye: No discharge.        Left eye: No discharge.     Conjunctiva/sclera: Conjunctivae normal.     Right eye: Right conjunctiva is not injected. No chemosis.    Left eye: Left conjunctiva is not injected. No chemosis.    Pupils: Pupils are equal, round, and reactive to light.  Cardiovascular:     Rate and Rhythm: Normal rate and regular rhythm.     Heart sounds: Normal heart sounds.  Pulmonary:     Effort: Pulmonary effort is normal. No tachypnea, accessory muscle usage or respiratory distress.     Breath sounds: Normal breath sounds. No wheezing, rhonchi or rales.     Comments: Moving air well in all lung fields.  No increased work of breathing. Chest:     Chest wall: No tenderness.  Lymphadenopathy:     Cervical: No cervical adenopathy.  Skin:    Coloration: Skin is not pale.     Findings: No abrasion,  erythema, petechiae or rash. Rash is not papular, urticarial or vesicular.     Comments: No eczematous or urticarial lesions noted.  Neurological:     Mental Status: She is alert.  Psychiatric:        Behavior: Behavior is cooperative.     Diagnostic studies: none    Salvatore Marvel, MD  [facility]

## 2021-02-03 ENCOUNTER — Encounter: Payer: Self-pay | Admitting: Allergy & Immunology

## 2021-02-11 ENCOUNTER — Ambulatory Visit (INDEPENDENT_AMBULATORY_CARE_PROVIDER_SITE_OTHER): Payer: Medicare Other | Admitting: *Deleted

## 2021-02-11 DIAGNOSIS — J309 Allergic rhinitis, unspecified: Secondary | ICD-10-CM | POA: Diagnosis not present

## 2021-02-15 ENCOUNTER — Ambulatory Visit (INDEPENDENT_AMBULATORY_CARE_PROVIDER_SITE_OTHER): Payer: Medicare Other | Admitting: *Deleted

## 2021-02-15 DIAGNOSIS — J309 Allergic rhinitis, unspecified: Secondary | ICD-10-CM | POA: Diagnosis not present

## 2021-02-23 ENCOUNTER — Other Ambulatory Visit: Payer: Self-pay

## 2021-02-23 ENCOUNTER — Ambulatory Visit (INDEPENDENT_AMBULATORY_CARE_PROVIDER_SITE_OTHER): Payer: Medicare Other

## 2021-02-23 DIAGNOSIS — Z23 Encounter for immunization: Secondary | ICD-10-CM

## 2021-02-23 NOTE — Progress Notes (Signed)
   Covid-19 Vaccination Clinic  Name:  Tiffany Velasquez    MRN: [id number] DOB: 10/03/1995  02/23/2021  [name] was observed post Covid-19 immunization for 15 minutes without incident. She was provided with Vaccine Information Sheet and instruction to access the V-Safe system.   [name] was instructed to call 911 with any severe reactions post vaccine: Difficulty breathing  Swelling of face and throat  A fast heartbeat  A bad rash all over body  Dizziness and weakness   Immunizations Administered     Name Date Dose VIS Date Route   Pfizer Covid-19 Vaccine Bivalent Booster 02/23/2021 11:10 AM 0.3 mL 11/25/2020 Intramuscular   Manufacturer: [employer], [employer]   Lot: [id number]   NDC: [phone]

## 2021-02-26 ENCOUNTER — Ambulatory Visit (INDEPENDENT_AMBULATORY_CARE_PROVIDER_SITE_OTHER): Payer: Medicare Other

## 2021-02-26 DIAGNOSIS — J309 Allergic rhinitis, unspecified: Secondary | ICD-10-CM

## 2021-03-02 ENCOUNTER — Ambulatory Visit (INDEPENDENT_AMBULATORY_CARE_PROVIDER_SITE_OTHER): Payer: Medicare Other

## 2021-03-02 DIAGNOSIS — J309 Allergic rhinitis, unspecified: Secondary | ICD-10-CM | POA: Diagnosis not present

## 2021-03-08 ENCOUNTER — Other Ambulatory Visit: Payer: Self-pay

## 2021-03-08 ENCOUNTER — Ambulatory Visit (INDEPENDENT_AMBULATORY_CARE_PROVIDER_SITE_OTHER): Payer: Medicare Other

## 2021-03-08 DIAGNOSIS — Z111 Encounter for screening for respiratory tuberculosis: Secondary | ICD-10-CM

## 2021-03-08 NOTE — Progress Notes (Signed)
Patient is here for a PPD placement.  PPD placed in left forearm @ 1:50 pm.  Patient will return 03/10/2021 to have PPD read. Veronda Prude, RN

## 2021-03-10 ENCOUNTER — Ambulatory Visit (INDEPENDENT_AMBULATORY_CARE_PROVIDER_SITE_OTHER): Payer: Medicare Other

## 2021-03-10 ENCOUNTER — Other Ambulatory Visit: Payer: Self-pay

## 2021-03-10 DIAGNOSIS — Z111 Encounter for screening for respiratory tuberculosis: Secondary | ICD-10-CM

## 2021-03-10 LAB — TB SKIN TEST
Induration: 0 mm
TB Skin Test: NEGATIVE

## 2021-03-10 NOTE — Progress Notes (Signed)
Patient is here for a PPD read.  It was placed on 03/08/21 in the left forearm @ 1:50 pm.    PPD RESULTS:  Result: negative Induration: 0 mm  Letter created and given to patient for documentation purposes. Veronda Prude, RN

## 2021-03-11 ENCOUNTER — Ambulatory Visit (INDEPENDENT_AMBULATORY_CARE_PROVIDER_SITE_OTHER): Payer: Medicare Other | Admitting: Family Medicine

## 2021-03-11 ENCOUNTER — Encounter: Payer: Self-pay | Admitting: Family Medicine

## 2021-03-11 ENCOUNTER — Other Ambulatory Visit: Payer: Self-pay

## 2021-03-11 VITALS — BP 112/69 | HR 76 | Ht 69.0 in | Wt >= 6400 oz

## 2021-03-11 DIAGNOSIS — Z23 Encounter for immunization: Secondary | ICD-10-CM | POA: Diagnosis not present

## 2021-03-11 DIAGNOSIS — Z0289 Encounter for other administrative examinations: Secondary | ICD-10-CM

## 2021-03-11 NOTE — Progress Notes (Signed)
° °  SUBJECTIVE:   CHIEF COMPLAINT / HPI:   Chief Complaint  Patient presents with   Form Completion     Tiffany Velasquez is a 26 y.o. female here for to have work form completed. She has no concerns today.      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 She doe have history of mild asthma. Advised her to keep inhaler at her job for urgent use. She is mentally and physically able perform her job duties. Form completed.    Influenza vaccine given   02/16/2021, DO PGY-3, [facility] 03/11/2021

## 2021-03-16 ENCOUNTER — Ambulatory Visit (INDEPENDENT_AMBULATORY_CARE_PROVIDER_SITE_OTHER): Payer: Medicare Other | Admitting: *Deleted

## 2021-03-16 ENCOUNTER — Encounter: Payer: Self-pay | Admitting: Family Medicine

## 2021-03-16 ENCOUNTER — Other Ambulatory Visit: Payer: Self-pay

## 2021-03-16 ENCOUNTER — Telehealth: Payer: Self-pay

## 2021-03-16 ENCOUNTER — Ambulatory Visit (INDEPENDENT_AMBULATORY_CARE_PROVIDER_SITE_OTHER): Payer: Medicare Other | Admitting: Family Medicine

## 2021-03-16 DIAGNOSIS — J309 Allergic rhinitis, unspecified: Secondary | ICD-10-CM

## 2021-03-16 DIAGNOSIS — R0981 Nasal congestion: Secondary | ICD-10-CM | POA: Diagnosis not present

## 2021-03-16 NOTE — Assessment & Plan Note (Signed)
With associated sore throat likely from PND.  No signs of asthma exacerbation or strep throat or flu.  Will treat with nasal saline and local analgesics with close monitoring for asthma symptoms

## 2021-03-16 NOTE — Progress Notes (Signed)
° ° °  SUBJECTIVE:   CHIEF COMPLAINT / HPI:   Throat Burning Since yesterday feels her throat intermittently burns.  Associated with nasal congestion.  Does not think her asthma is worsening specifcally but feels her breath catches when her throat burns.  No problems swallowing or fever or rash or lip swelling or adenopathy.  Did take one prednisone tablet that she had left over from an allergic reaction last pm.    PERTINENT  PMH / PSH: asthma, allergies,   OBJECTIVE:   BP (!) 130/92    Pulse (!) 101    Temp 98.9 F (37.2 C) (Oral)    Wt (!) 483 lb (219.1 kg)    LMP 02/14/2021    SpO2 99%    BMI 71.33 kg/m   Alert no distress Normal conversation Lungs:  Normal respiratory effort, chest expands symmetrically. Lungs are clear to auscultation, no crackles or wheezes. Heart - Regular rate and rhythm.  No murmurs, gallops or rubs.    Throat: normal mucosa, no exudate, uvula midline, very mild redness Neck:  No deformities, thyromegaly, masses, or tenderness noted.   Supple with full range of motion without pain.    ASSESSMENT/PLAN:   Nasal congestion With associated sore throat likely from PND.  No signs of asthma exacerbation or strep throat or flu.  Will treat with nasal saline and local analgesics with close monitoring for asthma symptoms      Carney Living, MD [facility]

## 2021-03-16 NOTE — Telephone Encounter (Signed)
I called the patient and left a detail message that at this time Dr. Dellis Anes does not recommend an antibiotic as treatment for the patient's symptoms. I did inform her to call back with any questions or concerns.

## 2021-03-16 NOTE — Telephone Encounter (Signed)
Per the PCP note, she has had symptoms for 2 days.  This certainly does not warrant antibiotic and I think her PCP did the right thing.  Continue with symptomatic treatment and reach out if it lasts longer than 5 to 7 days.  Malachi Bonds, MD [facility]

## 2021-03-16 NOTE — Patient Instructions (Addendum)
Good to see you today - Thank you for coming in  Things we discussed today:  Use the Astelin twice a day every day  Use nasal saline at least every 4 hours   For Pain Chloraspetic Spray as needed for the throat stinging Tylenol will also help   If your asthma seems worse - wheezing or more trouble breathing then My chart [place] - we may need to start prednisone    Please always bring your medication bottles

## 2021-03-16 NOTE — Telephone Encounter (Signed)
Patient has a sore throat due to post nasal drip and is requesting antibiotic. Please see PCP note from today in epic.  Please advise.

## 2021-03-16 NOTE — Telephone Encounter (Signed)
Patient called stating she is having a lot of post nasal drip that is making her throat sore. She seen PCP today. The visit is in Epic. Patient is requesting an antibiotic.  [facility]

## 2021-04-02 ENCOUNTER — Ambulatory Visit (INDEPENDENT_AMBULATORY_CARE_PROVIDER_SITE_OTHER): Payer: Medicare Other | Admitting: *Deleted

## 2021-04-02 DIAGNOSIS — J309 Allergic rhinitis, unspecified: Secondary | ICD-10-CM

## 2021-04-09 ENCOUNTER — Ambulatory Visit (INDEPENDENT_AMBULATORY_CARE_PROVIDER_SITE_OTHER): Payer: Medicare Other

## 2021-04-09 DIAGNOSIS — J309 Allergic rhinitis, unspecified: Secondary | ICD-10-CM | POA: Diagnosis not present

## 2021-04-16 ENCOUNTER — Ambulatory Visit (INDEPENDENT_AMBULATORY_CARE_PROVIDER_SITE_OTHER): Payer: Medicare Other | Admitting: *Deleted

## 2021-04-16 DIAGNOSIS — J309 Allergic rhinitis, unspecified: Secondary | ICD-10-CM

## 2021-04-19 ENCOUNTER — Other Ambulatory Visit: Payer: Self-pay | Admitting: Allergy & Immunology

## 2021-04-19 NOTE — Telephone Encounter (Signed)
Patient called needing a refill on azelastine nasal spray. I sent in  a refill for her to the [facility] on [address] in [place].

## 2021-04-23 ENCOUNTER — Telehealth: Payer: Self-pay | Admitting: Allergy & Immunology

## 2021-04-23 MED ORDER — PREDNISONE 10 MG PO TABS: ORAL_TABLET | ORAL | 0 refills | Status: DC

## 2021-04-23 NOTE — Telephone Encounter (Signed)
Antibiotics do not treat allergies.  I can send in a round of prednisone instead. Pended script. Please confirm pharmacy and sign on my behalf.  Malachi Bonds, MD [facility]

## 2021-04-23 NOTE — Telephone Encounter (Signed)
Patient called stating she has been having issues with her allergies lately. Patient has been using her astelin, singulair and zyrtec with no relief. Patient has had a stuffy nose and can feel it in her head. Patient is wanting to know if Dr. Dellis Anes could send in an antibiotic for her.   [facility] [employer] number:[phone]

## 2021-04-23 NOTE — Telephone Encounter (Signed)
Spoke with patient, informed her of Dr. Ellouise Newer recommendation. Prescription has been sent to the requested pharmacy, patient verbalized understanding.

## 2021-04-27 ENCOUNTER — Ambulatory Visit (INDEPENDENT_AMBULATORY_CARE_PROVIDER_SITE_OTHER): Payer: Medicare Other | Admitting: *Deleted

## 2021-04-27 DIAGNOSIS — J309 Allergic rhinitis, unspecified: Secondary | ICD-10-CM | POA: Diagnosis not present

## 2021-05-05 DIAGNOSIS — J301 Allergic rhinitis due to pollen: Secondary | ICD-10-CM | POA: Diagnosis not present

## 2021-05-05 NOTE — Progress Notes (Signed)
VIALS EXP 05-05-22 °

## 2021-05-06 ENCOUNTER — Ambulatory Visit (INDEPENDENT_AMBULATORY_CARE_PROVIDER_SITE_OTHER): Payer: Medicare Other

## 2021-05-06 DIAGNOSIS — J309 Allergic rhinitis, unspecified: Secondary | ICD-10-CM | POA: Diagnosis not present

## 2021-05-07 DIAGNOSIS — J3089 Other allergic rhinitis: Secondary | ICD-10-CM | POA: Diagnosis not present

## 2021-05-14 ENCOUNTER — Ambulatory Visit (INDEPENDENT_AMBULATORY_CARE_PROVIDER_SITE_OTHER): Payer: Medicare Other

## 2021-05-14 DIAGNOSIS — J309 Allergic rhinitis, unspecified: Secondary | ICD-10-CM

## 2021-05-20 ENCOUNTER — Ambulatory Visit (INDEPENDENT_AMBULATORY_CARE_PROVIDER_SITE_OTHER): Payer: Medicare Other | Admitting: *Deleted

## 2021-05-20 DIAGNOSIS — J309 Allergic rhinitis, unspecified: Secondary | ICD-10-CM

## 2021-06-01 ENCOUNTER — Ambulatory Visit (INDEPENDENT_AMBULATORY_CARE_PROVIDER_SITE_OTHER): Payer: Medicare Other

## 2021-06-01 DIAGNOSIS — J309 Allergic rhinitis, unspecified: Secondary | ICD-10-CM

## 2021-06-11 ENCOUNTER — Ambulatory Visit (INDEPENDENT_AMBULATORY_CARE_PROVIDER_SITE_OTHER): Payer: Medicare Other | Admitting: *Deleted

## 2021-06-11 DIAGNOSIS — J309 Allergic rhinitis, unspecified: Secondary | ICD-10-CM

## 2021-06-21 ENCOUNTER — Ambulatory Visit (INDEPENDENT_AMBULATORY_CARE_PROVIDER_SITE_OTHER): Payer: Medicare Other

## 2021-06-21 DIAGNOSIS — J309 Allergic rhinitis, unspecified: Secondary | ICD-10-CM | POA: Diagnosis not present

## 2021-07-07 ENCOUNTER — Ambulatory Visit (INDEPENDENT_AMBULATORY_CARE_PROVIDER_SITE_OTHER): Payer: Medicare Other | Admitting: *Deleted

## 2021-07-07 DIAGNOSIS — J309 Allergic rhinitis, unspecified: Secondary | ICD-10-CM | POA: Diagnosis not present

## 2021-07-14 ENCOUNTER — Telehealth: Payer: Self-pay

## 2021-07-14 NOTE — Telephone Encounter (Signed)
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

## 2021-07-16 ENCOUNTER — Encounter: Payer: Self-pay | Admitting: Family Medicine

## 2021-07-16 ENCOUNTER — Ambulatory Visit (INDEPENDENT_AMBULATORY_CARE_PROVIDER_SITE_OTHER): Payer: Medicare Other | Admitting: Family Medicine

## 2021-07-16 VITALS — BP 139/81 | HR 91 | Ht 69.0 in | Wt >= 6400 oz

## 2021-07-16 DIAGNOSIS — M79671 Pain in right foot: Secondary | ICD-10-CM | POA: Insufficient documentation

## 2021-07-16 MED ORDER — DICLOFENAC SODIUM 1 % EX GEL: 2.0000 g | Freq: Four times a day (QID) | CUTANEOUS | 0 refills | Status: DC

## 2021-07-16 NOTE — Assessment & Plan Note (Signed)
Patient is a obese 26 year old female with 3 weeks of right heel pain.  Prescribed diclofenac gel to be used up to 4 times daily.  Suspect Achilles tendinitis but possibly anterior medial and tubular cartilage injury versus impingement.  It is reasonable for her to see a podiatrist at [facility].  Referral placed. ?

## 2021-07-16 NOTE — Patient Instructions (Signed)
It was great seeing you today! ? ?Visit Remembers: ?- Stop by the pharmacy to pick up your prescriptions  ?- Continue to work on your healthy eating habits and incorporating exercise into your daily life. ?- Medicine Changes: start Diclofenac gel for your ankle up to 4 times a day  ?- A referral to podiatry ([facility]) was placed be call us in 2 weeks if you have not been contacted about an appointment. ? ? ?Please bring all of your medications with you to each visit.  ? ?Feel free to call with any questions or concerns at any time, at [phone]. ?  ?Take care,  ?Dr. Rachael Darby ?[facility]  ?

## 2021-07-16 NOTE — Telephone Encounter (Signed)
She is allergic to peanuts, tree nuts, shellfish)(peanuts, tree nuts, shellfish.  ? ?Please write the letter.  ? ?Malachi Bonds, MD ?[facility] ? ?

## 2021-07-16 NOTE — Telephone Encounter (Signed)
Letter has been printed and placed up front for pickup in the [place] office. Called and left a voicemail asking for patient to return call to inform.  ?

## 2021-07-16 NOTE — Progress Notes (Signed)
? ?  SUBJECTIVE:  ? ?CHIEF COMPLAINT / HPI:  ? ?Chief Complaint  ?Patient presents with  ? Foot Pain  ?  Right heel pain (soreness) x 3 weeks  ? ? ? ?Tiffany Velasquez is a 26 y.o. female here for right heel pain for the past 3 weeks.  She reports improvement with her dad's Voltaren gel.  She notes that her pain began worsening with increased activity.  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  She has been using a soft brace but this is not helping.  Denies trauma, fall.  No previous injury or similar symptoms.  She would like to see someone that her family sees at [facility]. ? ? ?PERTINENT  PMH / PSH: reviewed and updated as appropriate  ? ?OBJECTIVE:  ? ?BP 139/81   Pulse 91   Ht 5\' 9"  (1.753 m)   Wt (!) 475 lb (215.5 kg)   LMP 05/31/2021   BMI 70.15 kg/m?   ? ?GEN: well appearing female in no acute distress  ?CVS: well perfused  ?RESP: speaking in full sentences without pause, no respiratory distress  ?MSK: Right ankle: Tender to palpation at the achilles insertion.  No visible erythema, swelling or ecchymosis. No bony deformity. Negative anterior and posterior drawer.  Normal Thompson test.  Pain with dorsiflexion.  Strength 5/5 plantarflexion, dorsiflexion, eversion and inversion.  ? ? ?ASSESSMENT/PLAN:  ? ?Pain of right heel ?Patient is a obese 26 year old female with 3 weeks of right heel pain.  Prescribed diclofenac gel to be used up to 4 times daily.  Suspect Achilles tendinitis but possibly anterior medial and tubular cartilage injury versus impingement.  It is reasonable for her to see a podiatrist at [facility].  Referral placed. ? ? ? ?27, DO ?PGY-3, [facility] ?07/16/2021  ? ? ? ? ? ? ? ? ?

## 2021-07-19 NOTE — Telephone Encounter (Signed)
Called and left a detailed voicemail informing patient.  ?

## 2021-07-22 ENCOUNTER — Ambulatory Visit (INDEPENDENT_AMBULATORY_CARE_PROVIDER_SITE_OTHER): Payer: Medicare Other

## 2021-07-22 DIAGNOSIS — J309 Allergic rhinitis, unspecified: Secondary | ICD-10-CM | POA: Diagnosis not present

## 2021-07-29 ENCOUNTER — Ambulatory Visit (INDEPENDENT_AMBULATORY_CARE_PROVIDER_SITE_OTHER): Payer: Medicare Other | Admitting: Podiatry

## 2021-07-29 ENCOUNTER — Encounter: Payer: Self-pay | Admitting: Podiatry

## 2021-07-29 ENCOUNTER — Ambulatory Visit (INDEPENDENT_AMBULATORY_CARE_PROVIDER_SITE_OTHER): Payer: Medicare Other

## 2021-07-29 DIAGNOSIS — M21861 Other specified acquired deformities of right lower leg: Secondary | ICD-10-CM

## 2021-07-29 DIAGNOSIS — M7751 Other enthesopathy of right foot: Secondary | ICD-10-CM

## 2021-07-29 DIAGNOSIS — M7661 Achilles tendinitis, right leg: Secondary | ICD-10-CM

## 2021-07-29 DIAGNOSIS — M7752 Other enthesopathy of left foot: Secondary | ICD-10-CM

## 2021-07-29 DIAGNOSIS — M7750 Other enthesopathy of unspecified foot: Secondary | ICD-10-CM

## 2021-07-29 MED ORDER — MELOXICAM 15 MG PO TABS: 15.0000 mg | ORAL_TABLET | Freq: Every day | ORAL | 3 refills | Status: DC

## 2021-07-29 NOTE — Patient Instructions (Addendum)
Call to schedule physical therapy: ?[facility] ?[place] ? [phone] ? ? ? ? ?Look for Voltaren gel at the pharmacy over the counter or online (also known as diclofenac 1% gel). Apply to the painful areas 3-4x daily with the supplied dosing card. Allow to dry for 10 minutes before going into socks/shoes ? ? ? ? ?Achilles Tendinitis  ?with Rehab ?Achilles tendinitis is a disorder of the Achilles tendon. The Achilles tendon connects the large calf muscles (Gastrocnemius and Soleus) to the heel bone (calcaneus). This tendon is sometimes called the heel cord. It is important for pushing-off and standing on your toes and is important for walking, running, or jumping. Tendinitis is often caused by overuse and repetitive microtrauma. ?SYMPTOMS ?Pain, tenderness, swelling, warmth, and redness may occur over the Achilles tendon even at rest. ?Pain with pushing off, or flexing or extending the ankle. ?Pain that is worsened after or during activity. ?CAUSES  ?Overuse sometimes seen with rapid increase in exercise programs or in sports requiring running and jumping. ?Poor physical conditioning (strength and flexibility or endurance). ?Running sports, especially training running down hills. ?Inadequate warm-up before practice or play or failure to stretch before participation. ?Injury to the tendon. ?PREVENTION  ?Warm up and stretch before practice or competition. ?Allow time for adequate rest and recovery between practices and competition. ?Keep up conditioning. ?Keep up ankle and leg flexibility. ?Improve or keep muscle strength and endurance. ?Improve cardiovascular fitness. ?Use proper technique. ?Use proper equipment (shoes, skates). ?To help prevent recurrence, taping, protective strapping, or an adhesive bandage may be recommended for several weeks after healing is complete. ?PROGNOSIS  ?Recovery may take weeks to several months to heal. ?Longer  recovery is expected if symptoms have been prolonged. ?Recovery is usually quicker if the inflammation is due to a direct blow as compared with overuse or sudden strain. ?RELATED COMPLICATIONS  ?Healing time will be prolonged if the condition is not correctly treated. The injury must be given plenty of time to heal. ?Symptoms can reoccur if activity is resumed too soon. ?Untreated, tendinitis may increase the risk of tendon rupture requiring additional time for recovery and possibly surgery. ?TREATMENT  ?The first treatment consists of rest anti-inflammatory medication, and ice to relieve the pain. ?Stretching and strengthening exercises after resolution of pain will likely help reduce the risk of recurrence. Referral to a physical therapist or athletic trainer for further evaluation and treatment may be helpful. ?A walking boot or cast may be recommended to rest the Achilles tendon. This can help break the cycle of inflammation and microtrauma. ?Arch supports (orthotics) may be prescribed or recommended by your caregiver as an adjunct to therapy and rest. ?Surgery to remove the inflamed tendon lining or degenerated tendon tissue is rarely necessary and has shown less than predictable results. ?MEDICATION  ?Nonsteroidal anti-inflammatory medications, such as aspirin and ibuprofen, may be used for pain and inflammation relief. Do not take within 7 days before surgery. Take these as directed by your caregiver. Contact your caregiver immediately if any bleeding, stomach upset, or signs of allergic reaction occur. Other minor pain relievers, such as acetaminophen, may also be used. ?Pain relievers may be prescribed as necessary by your caregiver. Do not take prescription pain medication for longer than 4 to 7 days. Use only as directed and only as much as you need. ?Cortisone injections are rarely indicated. Cortisone injections may weaken tendons and predispose to rupture. It is better to give  the condition more time  to heal than to use them. ?HEAT AND COLD ?Cold is used to relieve pain and reduce inflammation for acute and chronic Achilles tendinitis. Cold should be applied for 10 to 15 minutes every 2 to 3 hours for inflammation and pain and immediately after any activity that aggravates your symptoms. Use ice packs or an ice massage. ?Heat may be used before performing stretching and strengthening activities prescribed by your caregiver. Use a heat pack or a warm soak. ?SEEK MEDICAL CARE IF: ?Symptoms get worse or do not improve in 2 weeks despite treatment. ?New, unexplained symptoms develop. Drugs used in treatment may produce side effects. ? ?EXERCISES: ? ?RANGE OF MOTION (ROM) AND STRETCHING EXERCISES - Achilles Tendinitis  ?These exercises may help you when beginning to rehabilitate your injury. Your symptoms may resolve with or without further involvement from your physician, physical therapist or athletic trainer. While completing these exercises, remember:  ?Restoring tissue flexibility helps normal motion to return to the joints. This allows healthier, less painful movement and activity. ?An effective stretch should be held for at least 30 seconds. ?A stretch should never be painful. You should only feel a gentle lengthening or release in the stretched tissue. ? ?STRETCH  Gastroc, Standing  ?Place hands on wall. ?Extend right / left leg, keeping the front knee somewhat bent. ?Slightly point your toes inward on your back foot. ?Keeping your right / left heel on the floor and your knee straight, shift your weight toward the wall, not allowing your back to arch. ?You should feel a gentle stretch in the right / left calf. Hold this position for 10 seconds. ?Repeat 3 times. Complete this stretch 2 times per day. ? ?STRETCH  Soleus, Standing  ?Place hands on wall. ?Extend right / left leg, keeping the other knee somewhat bent. ?Slightly point your toes inward on your back foot. ?Keep your right / left heel on the floor,  bend your back knee, and slightly shift your weight over the back leg so that you feel a gentle stretch deep in your back calf. ?Hold this position for 10 seconds. ?Repeat 3 times. Complete this stretch 2 times per day. ? ?Mirant, Standing  ?Note: This exercise can place a lot of stress on your foot and ankle. Please complete this exercise only if specifically instructed by your caregiver.  ?Place the ball of your right / left foot on a step, keeping your other foot firmly on the same step. ?Hold on to the wall or a rail for balance. ?Slowly lift your other foot, allowing your body weight to press your heel down over the edge of the step. ?You should feel a stretch in your right / left calf. ?Hold this position for 10 seconds. ?Repeat this exercise with a slight bend in your knee. ?Repeat 3 times. Complete this stretch 2 times per day.  ? ?STRENGTHENING EXERCISES - Achilles Tendinitis ?These exercises may help you when beginning to rehabilitate your injury. They may resolve your symptoms with or without further involvement from your physician, physical therapist or athletic trainer. While completing these exercises, remember:  ?Muscles can gain both the endurance and the strength needed for everyday activities through controlled exercises. ?Complete these exercises as instructed by your physician, physical therapist or athletic trainer. Progress the resistance and repetitions only as guided. ?You may experience muscle soreness or fatigue, but the pain or discomfort you are trying to eliminate should never worsen during these exercises. If this pain does  worsen, stop and make certain you are following the directions exactly. If the pain is still present after adjustments, discontinue the exercise until you can discuss the trouble with your clinician. ? ?STRENGTH - Plantar-flexors  ?Sit with your right / left leg extended. Holding onto both ends of a rubber exercise band/tubing, loop it around the ball  of your foot. Keep a slight tension in the band. ?Slowly push your toes away from you, pointing them downward. ?Hold this position for 10 seconds. Return slowly, controlling the tension in the band/tubing

## 2021-08-03 ENCOUNTER — Encounter: Payer: Self-pay | Admitting: Podiatry

## 2021-08-03 NOTE — Progress Notes (Signed)
?  Subjective:  ?Patient ID: Tiffany Velasquez, female    DOB: Aug 02, 1995,  MRN: [id number] ? ?Worsening Achilles pain for about 1 month she works at a daycare in school and has been using Voltaren gel she was referred to me by her uncle Merry Proud Noris ? ?26 y.o. female presents with the above complaint. History confirmed with patient.  ? ?Objective:  ?Physical Exam: ?warm, good capillary refill, no trophic changes or ulcerative lesions, normal DP and PT pulses, and normal sensory exam. ?Left Foot: normal exam, no swelling, tenderness, instability; ligaments intact, full range of motion of all ankle/foot joints ?Right Foot: tenderness at Achilles tendon midsubstance and gastrocnemius equinus is noted with a positive silverskiold test ? ? ?Radiographs: ?Multiple views x-ray of the right foot: no fracture, dislocation, swelling or degenerative changes noted and posterior calcaneal spur is noted ?Assessment:  ? ?1. Achilles tendinitis of right lower extremity   ?2. Gastrocnemius equinus of right lower extremity   ?3. Tendonitis of ankle or foot   ? ? ? ?Plan:  ?Patient was evaluated and treated and all questions answered. ? ?Discussed the etiology and treatment options for Achilles tendinitis including stretching, formal physical therapy with an eccentric exercises therapy plan, supportive shoegears such as a running shoe or sneaker, heel lifts, topical and oral medications.  We also discussed that I do not routinely perform injections in this area because of the risk of an increased damage or rupture of the tendon.  We also discussed the role of surgical treatment of this for patients who do not improve after exhausting non-surgical treatment options. ? ?-XR reviewed with patient ?-Educated on stretching and icing of the affected limb. ?-Referral placed to physical therapy. ?-Rx for Meloxicam. Advised on risks, benefits, and alternatives of the medication ? ? ?Return in about 6 weeks (around 09/09/2021).  ? ?

## 2021-08-05 ENCOUNTER — Ambulatory Visit (INDEPENDENT_AMBULATORY_CARE_PROVIDER_SITE_OTHER): Payer: Medicare Other

## 2021-08-05 DIAGNOSIS — J309 Allergic rhinitis, unspecified: Secondary | ICD-10-CM | POA: Diagnosis not present

## 2021-08-09 ENCOUNTER — Ambulatory Visit: Payer: Medicare Other | Admitting: Physical Therapy

## 2021-08-09 NOTE — Therapy (Incomplete)
?OUTPATIENT PHYSICAL THERAPY LOWER EXTREMITY EVALUATION ? ? ?Patient Name: Tiffany Velasquez ?MRN: [id number] ?DOB:11/10/1995, 26 y.o., female ?Today's Date: 08/09/2021 ? ? ? ?Past Medical History:  ?Diagnosis Date  ? Asthma   ? Eczema   ? Morbid obesity (HCC)   ? ?Past Surgical History:  ?Procedure Laterality Date  ? CATARACT EXTRACTION    ? no past surgery    ? ?Patient Active Problem List  ? Diagnosis Date Noted  ? Pain of right heel 07/16/2021  ? Nasal congestion 03/16/2021  ? Iritis 07/26/2018  ? Seasonal and perennial allergic rhinitis 07/26/2018  ? 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 High risk medication use 07/13/2018  ? 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 Retinal edema 07/13/2018  ? Adverse food reaction 06/15/2017  ? Moderate persistent asthma without complication 01/16/2017  ? Low back pain 12/22/2015  ? Food allergy 12/06/2014  ? Healthcare maintenance 04/12/2014  ? Asthma, chronic 06/22/2012  ? Family history of sudden cardiac death 09-03-2011  ? Morbid obesity (HCC) 10/05/2009  ? CONSTIPATION, CHRONIC 04/16/2008  ? ATTENTION DEFICIT, W/HYPERACTIVITY 05/25/2006  ? ALLERGIC CONJUNCTIVITIS 05/25/2006  ? Allergic rhinitis 05/25/2006  ? Severe eczema 05/25/2006  ? ? ?PCP: Katha Cabal, DO ? ?REFERRING PROVIDER: Edwin Cap, DPM ? ?REFERRING DIAG: Achilles tendinitis of right lower extremity [M76.61], Gastrocnemius equinus of right lower extremity [M21.861] ? ?THERAPY DIAG:  ?No diagnosis found. ? ?ONSET DATE: *** ? ?SUBJECTIVE:  ? ?SUBJECTIVE STATEMENT: ?*** ? ?PERTINENT HISTORY: ?Hx of Asthma ? ?PAIN:  ?Are you having pain? Yes: NPRS scale: ***/10 ?Pain location: *** ?Pain description: *** ?Aggravating factors: *** ?Relieving factors: *** ? ?PRECAUTIONS: {Therapy precautions:24002} ? ?WEIGHT BEARING RESTRICTIONS {Yes ***/No:24003} ? ?FALLS:  ?Has patient fallen in last 6 months?  {fallsyesno:27318} ? ?LIVING ENVIRONMENT: ?Lives with: {[facility] lives with:25569::"lives with their family"} ?Lives in: {Lives in:25570} ?Stairs: {opstairs:27293} ?Has following equipment at home: {Assistive devices:23999} ? ?OCCUPATION: *** ? ?PLOF: {PLOF:24004} ? ?PATIENT GOALS *** ? ? ?OBJECTIVE:  ? ?DIAGNOSTIC FINDINGS:  ?07/29/2021 ?Radiographs: ?Multiple views x-ray of the right foot: no fracture, dislocation, swelling or degenerative changes noted and posterior calcaneal spur is noted ? ?PATIENT SURVEYS:  ?FOTO *** ? ?COGNITION: ? Overall cognitive status: {cognition:24006}   ?  ?SENSATION: ?{sensation:27233} ? ? ?POSTURE:  ?*** ? ?PALPATION: ?*** ? ?LE ROM: ? ?Active ROM Right ?08/09/2021 Left ?08/09/2021  ?Hip flexion    ?Hip extension    ?Hip abduction    ?Hip adduction    ?Hip internal rotation    ?Hip external rotation    ?Knee flexion    ?Knee extension    ?Ankle dorsiflexion    ?Ankle plantarflexion    ?Ankle inversion    ?Ankle eversion    ? (Blank rows = not tested) ? ?LE MMT: ? ?MMT Right ?08/09/2021 Left ?08/09/2021  ?Hip flexion    ?Hip extension    ?Hip abduction    ?Hip adduction    ?Hip internal rotation    ?Hip external rotation    ?Knee flexion    ?Knee extension    ?Ankle dorsiflexion    ?Ankle plantarflexion    ?Ankle inversion    ?Ankle eversion    ? (Blank rows = not tested) ? ?LOWER EXTREMITY SPECIAL TESTS:  ?{LEspecialtests:26242} ? ?FUNCTIONAL TESTS:  ?{Functional tests:24029} ? ?GAIT: ?Distance walked: *** ?Assistive device utilized: {Assistive devices:23999} ?Level of assistance: {Levels of assistance:24026} ?Comments: *** ? ? ? ?TODAY'S  TREATMENT: ?[facility] Adult PT Treatment:                                                DATE: 08/09/2021 ?Therapeutic Exercise: ?*** ?Manual Therapy: ?*** ?Neuromuscular re-ed: ?*** ?Therapeutic Activity: ?*** ?Modalities: ?*** ?Self Care: ?*** ? ? ? ?PATIENT EDUCATION:  ?Education details: evaluation findings, POC, goals, HEP with proper form/ rationale. ?Person  educated: Patient ?Education method: Explanation, Verbal cues, and Handouts ?Education comprehension: verbalized understanding ? ? ?HOME EXERCISE PROGRAM: ?*** ? ?ASSESSMENT: ? ?CLINICAL IMPRESSION: ?Patient is a 26 y.o. F who was seen today for physical therapy evaluation and treatment for R calf/ ankle pain. *** ? ? ?OBJECTIVE IMPAIRMENTS {opptimpairments:25111}.  ? ?ACTIVITY LIMITATIONS {activity limitations:25113}.  ? ?PERSONAL FACTORS {Personal factors:25162} are also affecting patient's functional outcome.  ? ? ?REHAB POTENTIAL: {rehabpotential:25112} ? ?CLINICAL DECISION MAKING: {clinical decision making:25114} ? ?EVALUATION COMPLEXITY: {Evaluation complexity:25115} ? ? ?GOALS: ?Goals reviewed with patient? Yes ? ?SHORT TERM GOALS: Target date: {follow up:25551}  (Remove Blue Hyperlink) ? ?*** ?Baseline: ?Goal status: {GOALSTATUS:25110} ? ?2.  *** ?Baseline:  ?Goal status: {GOALSTATUS:25110} ? ?3.  *** ?Baseline:  ?Goal status: {GOALSTATUS:25110} ? ?4.  *** ?Baseline:  ?Goal status: {GOALSTATUS:25110} ? ? ?LONG TERM GOALS: Target date: {follow up:25551}  (Remove Blue Hyperlink) ? ?*** ?Baseline:  ?Goal status: {GOALSTATUS:25110} ? ?2.  *** ?Baseline:  ?Goal status: {GOALSTATUS:25110} ? ?3.  *** ?Baseline:  ?Goal status: {GOALSTATUS:25110} ? ?4.  *** ?Baseline:  ?Goal status: {GOALSTATUS:25110} ? ?5.  *** ?Baseline:  ?Goal status: {GOALSTATUS:25110} ? ?6.  *** ?Baseline:  ?Goal status: {GOALSTATUS:25110} ? ? ?PLAN: ?PT FREQUENCY: 1-2x/week ? ?PT DURATION: {rehab duration:25117} ? ?PLANNED INTERVENTIONS: Therapeutic exercises, Therapeutic activity, Neuromuscular re-education, Balance training, Gait training, Patient/Family education, Joint manipulation, Joint mobilization, Stair training, Orthotic/Fit training, Aquatic Therapy, Dry Needling, Electrical stimulation, Cryotherapy, Moist heat, Taping, Vasopneumatic device, Ultrasound, Ionotophoresis 4mg /ml Dexamethasone, Manual therapy, and Re-evaluation ? ?PLAN  FOR NEXT SESSION: Review/ update HEP PRN. *** ? ? ? PT, DPT, LAT, ATC  ?08/09/21  8:22 AM ? ? ? ? ? ?

## 2021-08-11 ENCOUNTER — Ambulatory Visit: Payer: Medicare Other | Attending: Podiatry

## 2021-08-11 DIAGNOSIS — M6281 Muscle weakness (generalized): Secondary | ICD-10-CM

## 2021-08-11 DIAGNOSIS — M21861 Other specified acquired deformities of right lower leg: Secondary | ICD-10-CM | POA: Diagnosis not present

## 2021-08-11 DIAGNOSIS — R2681 Unsteadiness on feet: Secondary | ICD-10-CM | POA: Diagnosis not present

## 2021-08-11 DIAGNOSIS — R600 Localized edema: Secondary | ICD-10-CM

## 2021-08-11 DIAGNOSIS — M7661 Achilles tendinitis, right leg: Secondary | ICD-10-CM | POA: Insufficient documentation

## 2021-08-11 DIAGNOSIS — R2689 Other abnormalities of gait and mobility: Secondary | ICD-10-CM

## 2021-08-11 DIAGNOSIS — M25571 Pain in right ankle and joints of right foot: Secondary | ICD-10-CM | POA: Diagnosis present

## 2021-08-11 NOTE — Therapy (Signed)
?OUTPATIENT PHYSICAL THERAPY LOWER EXTREMITY EVALUATION ? ? ?Patient Name: Tiffany Velasquez ?MRN: [id number] ?DOB:1995-05-12, 26 y.o., female ?Today's Date: 08/11/2021 ? ? PT End of Session - 08/11/21 1245   ? ? Visit Number 1   ? Number of Visits 17   ? Date for PT Re-Evaluation 10/06/21   ? Authorization Type Medicare   ? PT Start Time 1206   ? PT Stop Time 1240   ? PT Time Calculation (min) 34 min   ? Activity Tolerance Patient tolerated treatment well   ? [facility] tasks assessed/performed   ? ?  ?  ? ?  ? ? ?Past Medical History:  ?Diagnosis Date  ? Asthma   ? Eczema   ? Morbid obesity (HCC)   ? ?Past Surgical History:  ?Procedure Laterality Date  ? CATARACT EXTRACTION    ? no past surgery    ? ?Patient Active Problem List  ? Diagnosis Date Noted  ? Pain of right heel 07/16/2021  ? Nasal congestion 03/16/2021  ? Iritis 07/26/2018  ? Seasonal and perennial allergic rhinitis 07/26/2018  ? 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 High risk medication use 07/13/2018  ? 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 Retinal edema 07/13/2018  ? Adverse food reaction 06/15/2017  ? Moderate persistent asthma without complication 01/16/2017  ? Low back pain 12/22/2015  ? Food allergy 12/06/2014  ? Healthcare maintenance 04/12/2014  ? Asthma, chronic 06/22/2012  ? Family history of sudden cardiac death 08/04/2011  ? Morbid obesity (HCC) 10/05/2009  ? CONSTIPATION, CHRONIC 04/16/2008  ? ATTENTION DEFICIT, W/HYPERACTIVITY 05/25/2006  ? ALLERGIC CONJUNCTIVITIS 05/25/2006  ? Allergic rhinitis 05/25/2006  ? Severe eczema 05/25/2006  ? ? ?PCP: Katha CabalBrimage, Vondra, DO ? ?REFERRING PROVIDER: Edwin CapMcDonald, Adam R, DPM ? ?REFERRING DIAG:  ?M76.61 (ICD-10-CM) - Achilles tendinitis of right lower extremity ?M21.861 (ICD-10-CM) - Gastrocnemius equinus of right lower extremity ? ? ?THERAPY DIAG:  ?Pain in right ankle and joints of right foot  - Plan: PT plan of care cert/re-cert ? ?Muscle weakness (generalized) - Plan: PT plan of care cert/re-cert ? ?Other abnormalities of gait and mobility - Plan: PT plan of care cert/re-cert ? ?Localized edema - Plan: PT plan of care cert/re-cert ? ?ONSET DATE: April 2023 ? ?SUBJECTIVE:  ? ?SUBJECTIVE STATEMENT: ?Pt presents to PT with reports of R heel and achilles pain and discomfort. Denies trauma to R LE, insidious onset of increasing pain. Has been on her feet more over the past few weeks and has noticed increased pain with walking and standing. Pt also denies paresthesias in R LE or other red flag items.  ? ?PERTINENT HISTORY: ?Morbid obesity ? ?PAIN:  ?Are you having pain?  ?No: NPRS scale: 0/10 (10/10 at worst) ?Pain location: R heel ?Pain description: sharp ?Aggravating factors: walking, standing ?Relieving factors: rest, Voltaren, medication ? ?PRECAUTIONS: None ? ?WEIGHT BEARING RESTRICTIONS No ? ?FALLS:  ?Has patient fallen in last 6 months? No ? ?LIVING ENVIRONMENT: ?Lives with: lives with their family ?Lives in: House/apartment ?Stairs: Yes: External: 10 steps; on left going up ?Has following equipment at home: None ? ?OCCUPATION: Works at a daycare  ? ?PLOF: Independent and Independent with basic ADLs ? ?PATIENT GOALS: decrease R heel pain, improve comfort with work ? ? ?OBJECTIVE:  ? ?DIAGNOSTIC FINDINGS:  ? N/A ? ?PATIENT SURVEYS:  ?LEFS 62/80  - 78% function ? ?COGNITION: ? Overall cognitive status: Within  functional limits for tasks assessed   ?  ?SENSATION: ?WFL ? ?POSTURE:  ?Large body habitus, increased swelling R distal LE ? ?PALPATION: ?TTP to R distal achilles, R calf ? ?LE ROM: ? ?AROM Right ?08/11/2021 Left ?08/11/2021  ?Hip flexion     ?Hip extension    ?Hip abduction    ?Hip adduction    ?Hip internal rotation    ?Hip external rotation    ?Knee flexion    ?Knee extension    ?Ankle dorsiflexion 8 12  ?Ankle plantarflexion    ?Ankle inversion    ?Ankle eversion    ?(Blank rows = not  tested) ? ?LE MMT: ? ?MMT Right ?08/11/2021 Left ?08/11/2021  ?Hip flexion     ?Hip extension    ?Hip abduction    ?Hip adduction    ?Hip external rotation    ?Hip internal rotation    ?Knee extension    ?Knee flexion    ?Ankle dorsiflexion  5/5 5/5  ?Ankle plantarflexion 5/5 5/5  ?Ankle inversion 3+/5 5/5  ?Ankle eversion 3+/5 4/5  ?Grossly    ?(Blank rows = not tested) ? ? ?LOWER EXTREMITY SPECIAL TESTS:  ?N/A ? ?FUNCTIONAL TESTS:  ?30 Second Sit to Stand: 8 reps - increased pain ?SLS: 6 seconds R ? ?GAIT: ?Distance walked: 86ft ?Assistive device utilized: None ?Level of assistance: Complete Independence ?Comments: slight antalgic gait on R ? ? ? ?TODAY'S TREATMENT: ?[facility] Adult PT Treatment:                                                DATE: 08/11/2021 ?Therapeutic Exercise: ?R calf stretch with towel x 30" ?Standing calf stretch x 30" ?R ankle inversion/eversion x 5 RTB ? ?PATIENT EDUCATION:  ?Education details: eval findings, LEFS, HEP, POC ?Person educated: Patient ?Education method: Explanation, Demonstration, and Handouts ?Education comprehension: verbalized understanding and returned demonstration ? ? ?HOME EXERCISE PROGRAM: ?Access Code: [email or web address] ?URL: [email or web address] ?Date: 08/11/2021 ?Prepared by: Edwinna Areola ? ?Exercises ?- Long Sitting Calf Stretch with Strap  - 1-2 x daily - 7 x weekly - 2-3 reps - 30 sec hold ?- Gastroc Stretch on Wall  - 1-2 x daily - 7 x weekly - 3 sets - 2-3 reps - 30 sec hold ?- Ankle Inversion with Resistance  - 1-2 x daily - 7 x weekly - 2 sets - 10 reps - red theraband hold ?- Ankle Eversion with Resistance  - 1-2 x daily - 7 x weekly - 2 sets - 10 reps - red theraband hold ? ?ASSESSMENT: ? ?CLINICAL IMPRESSION: ?Patient is a 26 y.o. F who was seen today for physical therapy evaluation and treatment for recent onset of R heel pain. Physical findings are consistent with referring provider impression, as she demonstrates decreased calf length, decreased R  ankle strength, and decreased R ankle stability. Her LEFS score indicates minimal disability and shows she is operating below PLOF. Pt would benefit from skilled PT services working on improving ankle strength and stability in order to decrease pain and improve function.   ? ? ?OBJECTIVE IMPAIRMENTS Abnormal gait, decreased activity tolerance, decreased balance, difficulty walking, decreased ROM, decreased strength, and pain.  ? ?ACTIVITY LIMITATIONS community activity and occupation.  ? ?PERSONAL FACTORS Fitness and 1 comorbidity: Morbid obesity  are also affecting patient's functional outcome.  ? ? ?REHAB POTENTIAL: Excellent ? ?CLINICAL DECISION MAKING:  Stable/uncomplicated ? ?EVALUATION COMPLEXITY: Low ? ? ?GOALS: ?Goals reviewed with patient? No ? ?SHORT TERM GOALS: Target date: 09/01/2021  ? ?Pt will be compliant and knowledgeable with initial HEP for improved comfort and carryover ?Baseline: initial HEP given ?Goal status: INITIAL ? ?2.  Pt will self report right heel pain no greater than 6/10 for improved comfort and functional ability ?Baseline: 10/10 at worst ?Goal status: INITIAL ? ?LONG TERM GOALS: Target date: 10/06/2021   ? ?Pt will self report right heel pain no greater than 6/10 for improved comfort and functional ability ?Baseline: 10/10 at worst ?Goal status: INITIAL ? ?2.  Pt will improve LEFS to no less than 70/80 as proxy for functional improvement ?Baseline: 62/80 ?Goal status: INITIAL ? ?3.  Pt will increase SLS time to no less than 25 seconds bilaterally ?Baseline: 6 seconds ?Goal status: INITIAL ? ?4.  Pt will improve right ankle DF to no less than 12 deg for improved comfort and mobility ?Baseline: 8 deg  ?Goal status: INITIAL ? ?PLAN: ?PT FREQUENCY: 2x/week ? ?PT DURATION: 8 weeks ? ?PLANNED INTERVENTIONS: Therapeutic exercises, Therapeutic activity, Neuromuscular re-education, Balance training, Gait training, Patient/Family education, Joint mobilization, Aquatic Therapy, Dry Needling,  Electrical stimulation, Cryotherapy, Moist heat, Vasopneumatic device, Manual therapy, and Re-evaluation ? ?PLAN FOR NEXT SESSION: assess HEP response, ankle strengthening, calf stretching, vaso ? ? ?Eloy End, PT ?5

## 2021-08-18 ENCOUNTER — Ambulatory Visit (INDEPENDENT_AMBULATORY_CARE_PROVIDER_SITE_OTHER): Payer: Medicare Other

## 2021-08-18 DIAGNOSIS — J309 Allergic rhinitis, unspecified: Secondary | ICD-10-CM | POA: Diagnosis not present

## 2021-08-19 ENCOUNTER — Ambulatory Visit (INDEPENDENT_AMBULATORY_CARE_PROVIDER_SITE_OTHER): Payer: Medicare Other | Admitting: Family Medicine

## 2021-08-19 VITALS — BP 129/84 | HR 80 | Ht 69.0 in | Wt >= 6400 oz

## 2021-08-19 DIAGNOSIS — Z0000 Encounter for general adult medical examination without abnormal findings: Secondary | ICD-10-CM

## 2021-08-19 DIAGNOSIS — J302 Other seasonal allergic rhinitis: Secondary | ICD-10-CM | POA: Diagnosis not present

## 2021-08-19 DIAGNOSIS — J301 Allergic rhinitis due to pollen: Secondary | ICD-10-CM

## 2021-08-19 DIAGNOSIS — J3089 Other allergic rhinitis: Secondary | ICD-10-CM | POA: Diagnosis not present

## 2021-08-19 MED ORDER — LORATADINE 10 MG PO TABS: 10.0000 mg | ORAL_TABLET | Freq: Every day | ORAL | 11 refills | Status: DC

## 2021-08-19 MED ORDER — FLUTICASONE PROPIONATE 50 MCG/ACT NA SUSP: 1.0000 | Freq: Every day | NASAL | 3 refills | Status: DC

## 2021-08-19 NOTE — Patient Instructions (Signed)
It was a pleasure to see you today!  Your ear discomfort is probably from sinus inflammation Start taking claritin 10 mg once daily, stop zyrtec Continue azelastine, start using flonase one spray each nostril every morning. This works best when used for 3-4 weeks For ear wax: use hydrogen peroxide or debrox (over the counter) and put 4-5 drops about 4-5 times per day for 4-5 days  Be Well,  Dr. Leary Roca

## 2021-08-19 NOTE — Progress Notes (Signed)
    SUBJECTIVE:   CHIEF COMPLAINT / HPI:   Primary symptom: ear discomfort bilaterally, no diminishment in hearing, previously occurred in October and patient had ears washed out at urgent care Duration: 1 week Severity: mod Associated symptoms: runny nose, nasal congestion, no cough, no fever Fever? Tmax?: none Sick contacts: none Covid test: none Covid vaccination(s): 2 pfizer primary vaccines in 2021 and 2022  [facility] maintenance: Due for pap smear, never done. Counseled on COVID and HPV vaccines.  PERTINENT  PMH / PSH: seasonal and environmental allergies  OBJECTIVE:   BP 129/84   Pulse 80   Ht 5\' 9"  (1.753 m)   Wt (!) 485 lb 4 oz (220.1 kg)   LMP 07/26/2021   SpO2 98%   BMI 71.66 kg/m   Nursing note and vitals reviewed GEN: age-appropriate, resting comfortably in chair, NAD, class III obesity HEENT: NCAT. PERRLA. Sclera without injection or icterus. MMM. Nasal passages very inflamed, erythematous. R TM not visible due to impacted cerumen though EAC normal. L TM is normal, non-erythematous, non-bulging. Allergic shiners, nasal crease. Neck: Supple. No LAD Cardiac: Regular rate and rhythm. Normal S1/S2. No murmurs, rubs, or gallops appreciated. 2+ radial pulses. Lungs: Clear bilaterally to ascultation. No increased WOB, no accessory muscle usage. No w/r/r. Neuro: AOx3  Ext: no edema Psych: Pleasant and appropriate  ASSESSMENT/PLAN:   Seasonal and perennial allergic rhinitis With normal hearing and normal L TM, suspect eustachian tube dysfunction from nasal passage inflammation. Patient has severe allergies, sees allergy and asthma for weekly injections. She is on zyrtec for many years and azelastine, says she uses daily. Plan is to add fluticasone nasal spray to regimen and switch zyrtec to claritin. Recommend follow up with allergy and asthma. Also can use hydrogen peroxide for ear wax.  Healthcare maintenance Counseled on vaccines as above, recommend pap smear.      Gladys Damme, MD [place]

## 2021-08-20 NOTE — Assessment & Plan Note (Signed)
Counseled on vaccines as above, recommend pap smear.

## 2021-08-20 NOTE — Assessment & Plan Note (Signed)
With normal hearing and normal L TM, suspect eustachian tube dysfunction from nasal passage inflammation. Patient has severe allergies, sees allergy and asthma for weekly injections. She is on zyrtec for many years and azelastine, says she uses daily. Plan is to add fluticasone nasal spray to regimen and switch zyrtec to claritin. Recommend follow up with allergy and asthma. Also can use hydrogen peroxide for ear wax.

## 2021-08-26 ENCOUNTER — Telehealth: Payer: Self-pay | Admitting: Podiatry

## 2021-08-26 ENCOUNTER — Ambulatory Visit: Payer: Medicare Other | Attending: Podiatry

## 2021-08-26 DIAGNOSIS — R2689 Other abnormalities of gait and mobility: Secondary | ICD-10-CM | POA: Insufficient documentation

## 2021-08-26 DIAGNOSIS — M25571 Pain in right ankle and joints of right foot: Secondary | ICD-10-CM | POA: Insufficient documentation

## 2021-08-26 DIAGNOSIS — M6281 Muscle weakness (generalized): Secondary | ICD-10-CM | POA: Insufficient documentation

## 2021-08-26 DIAGNOSIS — R600 Localized edema: Secondary | ICD-10-CM | POA: Insufficient documentation

## 2021-08-26 NOTE — Therapy (Signed)
OUTPATIENT PHYSICAL THERAPY TREATMENT NOTE   Patient Name: Tiffany Velasquez MRN: [id number] DOB:19-Oct-1995, 26 y.o., female Today's Date: 08/26/2021  PCP: Katha Cabal, DO REFERRING PROVIDER: Edwin Cap, DPM  END OF SESSION:   PT End of Session - 08/26/21 1357     Visit Number 2    Number of Visits 17    Date for PT Re-Evaluation 10/06/21    Authorization Type Medicare    PT Start Time 1400    PT Stop Time 1440    PT Time Calculation (min) 40 min    Activity Tolerance Patient tolerated treatment well    Behavior During Therapy WFL for tasks assessed/performed             Past Medical History:  Diagnosis Date   Asthma    Eczema    Morbid obesity (HCC)    Past Surgical History:  Procedure Laterality Date   CATARACT EXTRACTION     no past surgery     Patient Active Problem List   Diagnosis Date Noted   Pain of right heel 07/16/2021   Nasal congestion 03/16/2021   Iritis 07/26/2018   Seasonal and perennial allergic rhinitis 07/26/2018   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   High risk medication use 07/13/2018   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61 (ICD-10-CM) - Achilles tendinitis of right lower extremity M21.861 (ICD-10-CM) - Gastrocnemius equinus of right lower extremity  THERAPY DIAG:  Pain in right ankle and joints of right  foot  Muscle weakness (generalized)  Other abnormalities of gait and mobility  Rationale for Evaluation and Treatment Rehabilitation  PERTINENT HISTORY:  Morbid obesity  PRECAUTIONS:  None  SUBJECTIVE:  Pt presents to PT with reports of increased heel pain since PT evaluation. Has been compliant with her HEP with no adverse effect. Pt is ready to begin PT at this time.   PAIN:  Are you having pain?  Yes: NPRS scale: 7/10 (10/10 at worst) Pain location: R heel Pain description: sharp Aggravating factors: walking, standing Relieving factors: rest, Voltaren, medication   OBJECTIVE: (objective measures completed at initial evaluation unless otherwise dated)  DIAGNOSTIC FINDINGS:            N/A   PATIENT SURVEYS:  LEFS 62/80  - 78% function   COGNITION:           Overall cognitive status: Within functional limits for tasks assessed                          SENSATION: WFL   POSTURE:  Large body habitus, increased swelling R distal LE   PALPATION: TTP to R distal achilles, R calf   LE ROM:   AROM Right 08/11/2021 Left 08/11/2021  Hip flexion       Hip extension      Hip abduction      Hip adduction  Hip internal rotation      Hip external rotation      Knee flexion      Knee extension      Ankle dorsiflexion 8 12  Ankle plantarflexion      Ankle inversion      Ankle eversion      (Blank rows = not tested)   LE MMT:   MMT Right 08/11/2021 Left 08/11/2021  Hip flexion       Hip extension      Hip abduction      Hip adduction      Hip external rotation      Hip internal rotation      Knee extension      Knee flexion      Ankle dorsiflexion  5/5 5/5  Ankle plantarflexion 5/5 5/5  Ankle inversion 3+/5 5/5  Ankle eversion 3+/5 4/5  Grossly      (Blank rows = not tested)     LOWER EXTREMITY SPECIAL TESTS:  N/A   FUNCTIONAL TESTS:  30 Second Sit to Stand: 8 reps - increased pain SLS: 6 seconds R   GAIT: Distance walked: 6925ft Assistive  device utilized: None Level of assistance: Complete Independence Comments: slight antalgic gait on R       TODAY'S TREATMENT: [facility] Adult PT Treatment:                                                DATE: 08/26/2021 Therapeutic Exercise: R PF 2x15 GTB R calf stretch with strap 2x30" R ankle inversion/eversion 2x10 GTB Seated heel raise x 10 - 10# KB STS 2x10 - no UE support  Standing calf stretch x 30" each Standing soleus stretch x 30" Manual Therapy: STM to R gastroc  [facility] Adult PT Treatment:                                                DATE: 08/11/2021 Therapeutic Exercise: R calf stretch with towel x 30" Standing calf stretch x 30" R ankle inversion/eversion x 5 RTB   PATIENT EDUCATION:  Education details: eval findings, LEFS, HEP, POC Person educated: Patient Education method: Explanation, Demonstration, and Handouts Education comprehension: verbalized understanding and returned demonstration     HOME EXERCISE PROGRAM: Access Code: [email or web address] URL: [email or web address] Date: 08/26/2021 Prepared by: Edwinna Areolaavid Hasina Kreager  Exercises - Long Sitting Calf Stretch with Strap  - 1-2 x daily - 7 x weekly - 2-3 reps - 30 sec hold - Gastroc Stretch on Wall  - 1-2 x daily - 7 x weekly - 2-3 reps - 30 sec hold - Standing Soleus Stretch  - 1 x daily - 7 x weekly - 2-3 reps - 30 sec hold - Ankle Inversion with Resistance  - 1-2 x daily - 7 x weekly - 2 sets - 10 reps - red theraband hold - Ankle Eversion with Resistance  - 1-2 x daily - 7 x weekly - 2 sets - 10 reps - red theraband hold   ASSESSMENT:   CLINICAL IMPRESSION: Pt was able to complete all prescribed exercises with no adverse effect or increase in pain. Therapy focused on improving distal ankle strength and muscle length. She responded well to manual therapy interventions, reporting  decreased pain and discomfort. PT will continue to progress exercises as tolerated per POC.     OBJECTIVE IMPAIRMENTS Abnormal gait,  decreased activity tolerance, decreased balance, difficulty walking, decreased ROM, decreased strength, and pain.    ACTIVITY LIMITATIONS community activity and occupation.    PERSONAL FACTORS Fitness and 1 comorbidity: Morbid obesity  are also affecting patient's functional outcome.      GOALS: Goals reviewed with patient? No   SHORT TERM GOALS: Target date: 09/01/2021    Pt will be compliant and knowledgeable with initial HEP for improved comfort and carryover Baseline: initial HEP given Goal status: INITIAL   2.  Pt will self report right heel pain no greater than 6/10 for improved comfort and functional ability Baseline: 10/10 at worst Goal status: INITIAL   LONG TERM GOALS: Target date: 10/06/2021     Pt will self report right heel pain no greater than 6/10 for improved comfort and functional ability Baseline: 10/10 at worst Goal status: INITIAL   2.  Pt will improve LEFS to no less than 70/80 as proxy for functional improvement Baseline: 62/80 Goal status: INITIAL   3.  Pt will increase SLS time to no less than 25 seconds bilaterally Baseline: 6 seconds Goal status: INITIAL   4.  Pt will improve right ankle DF to no less than 12 deg for improved comfort and mobility Baseline: 8 deg  Goal status: INITIAL   PLAN: PT FREQUENCY: 2x/week   PT DURATION: 8 weeks   PLANNED INTERVENTIONS: Therapeutic exercises, Therapeutic activity, Neuromuscular re-education, Balance training, Gait training, Patient/Family education, Joint mobilization, Aquatic Therapy, Dry Needling, Electrical stimulation, Cryotherapy, Moist heat, Vasopneumatic device, Manual therapy, and Re-evaluation   PLAN FOR NEXT SESSION: assess HEP response, ankle strengthening, calf stretching, vaso   Eloy End, PT 08/26/2021, 2:48 PM

## 2021-08-26 NOTE — Telephone Encounter (Signed)
Patient is calling to requesting something for pain,is experiencing pain that has moved from the heel to her calf while resting. She did see her PT today. Please advise.

## 2021-08-27 NOTE — Telephone Encounter (Signed)
Called patient and gave physician's recommendations, verbalized understanding

## 2021-08-30 ENCOUNTER — Ambulatory Visit: Payer: Medicare Other

## 2021-08-30 DIAGNOSIS — M25571 Pain in right ankle and joints of right foot: Secondary | ICD-10-CM

## 2021-08-30 DIAGNOSIS — R2689 Other abnormalities of gait and mobility: Secondary | ICD-10-CM

## 2021-08-30 DIAGNOSIS — M6281 Muscle weakness (generalized): Secondary | ICD-10-CM

## 2021-08-30 DIAGNOSIS — R600 Localized edema: Secondary | ICD-10-CM

## 2021-08-30 NOTE — Therapy (Addendum)
OUTPATIENT PHYSICAL THERAPY TREATMENT NOTE   Patient Name: Tiffany Velasquez MRN: [id number] DOB:09-15-1995, 26 y.o., female Today's Date: 08/30/2021  PCP: Lyndee Hensen, DO REFERRING PROVIDER: Criselda Peaches, DPM  END OF SESSION:   PT End of Session - 08/30/21 1130     Visit Number 3    Number of Visits 17    Date for PT Re-Evaluation 10/06/21    Authorization Type Medicare    PT Start Time 1130    PT Stop Time 1210    PT Time Calculation (min) 40 min    Activity Tolerance Patient tolerated treatment well    Behavior During Therapy WFL for tasks assessed/performed              Past Medical History:  Diagnosis Date   Asthma    Eczema    Morbid obesity ([place])    Past Surgical History:  Procedure Laterality Date   CATARACT EXTRACTION     no past surgery     Patient Active Problem List   Diagnosis Date Noted   Pain of right heel 07/16/2021   Nasal congestion 03/16/2021   Iritis 07/26/2018   Seasonal and perennial allergic rhinitis 07/26/2018   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   High risk medication use 07/13/2018   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   Retinal edema 07/13/2018   Adverse food reaction 06/15/2017   Moderate persistent asthma without complication 71/16/5790   Low back pain 12/22/2015   Food allergy 12/06/2014   Healthcare maintenance 04/12/2014   Asthma, chronic 06/22/2012   Family history of sudden cardiac death 2011-08-12   Morbid obesity ([place]) 10/05/2009   CONSTIPATION, CHRONIC 04/16/2008   ATTENTION DEFICIT, W/HYPERACTIVITY 05/25/2006   ALLERGIC CONJUNCTIVITIS 05/25/2006   Allergic rhinitis 05/25/2006   Severe eczema 05/25/2006    REFERRING DIAG:  M76.61 (ICD-10-CM) - Achilles tendinitis of right lower extremity M21.861 (ICD-10-CM) - Gastrocnemius equinus of right lower extremity  THERAPY DIAG:  Pain in right ankle and joints of right  foot  Muscle weakness (generalized)  Other abnormalities of gait and mobility  Localized edema  Rationale for Evaluation and Treatment Rehabilitation  PERTINENT HISTORY:  Morbid obesity  PRECAUTIONS:  None  SUBJECTIVE:  Pt presents to PT with reports of decreased pain and discomfort in R LE. She notes that she has been compliant with HEP with no adverse effect. Pt is ready to begin PT at this time.  PAIN:  Are you having pain?  Yes: NPRS scale: 3/10 (10/10 at worst) Pain location: R heel Pain description: sharp Aggravating factors: walking, standing Relieving factors: rest, Voltaren, medication   OBJECTIVE: (objective measures completed at initial evaluation unless otherwise dated)  DIAGNOSTIC FINDINGS:            N/A   PATIENT SURVEYS:  LEFS 62/80  - 78% function   COGNITION:           Overall cognitive status: Within functional limits for tasks assessed                          SENSATION: WFL   POSTURE:  Large body habitus, increased swelling R distal LE   PALPATION: TTP to R distal achilles, R calf   LE ROM:   AROM Right 08/11/2021 Left 08/11/2021  Hip flexion       Hip extension      Hip abduction  Hip adduction      Hip internal rotation      Hip external rotation      Knee flexion      Knee extension      Ankle dorsiflexion 8 12  Ankle plantarflexion      Ankle inversion      Ankle eversion      (Blank rows = not tested)   LE MMT:   MMT Right 08/11/2021 Left 08/11/2021  Hip flexion       Hip extension      Hip abduction      Hip adduction      Hip external rotation      Hip internal rotation      Knee extension      Knee flexion      Ankle dorsiflexion  5/5 5/5  Ankle plantarflexion 5/5 5/5  Ankle inversion 3+/5 5/5  Ankle eversion 3+/5 4/5  Grossly      (Blank rows = not tested)     LOWER EXTREMITY SPECIAL TESTS:  N/A   FUNCTIONAL TESTS:  30 Second Sit to Stand: 8 reps - increased pain SLS: 6 seconds R    GAIT: Distance walked: 68ft Assistive device utilized: None Level of assistance: Complete Independence Comments: slight antalgic gait on R       TODAY'S TREATMENT: [facility] Adult PT Treatment:                                                DATE: 08/30/2021 Therapeutic Exercise: R PF 2x15 GTB R calf stretch with strap 2x30" R ankle inversion/eversion 2x10 GTB Seated heel raise 2x15 - 10# KB BAPS cw/ccw 2x5 each L3 R STS 2x10 - no UE support  Standing calf stretch 2x30" each Standing soleus stretch x 30" Standing heel raise with tennis ball x 10 Manual Therapy: STM and trigger point to R gastroc  [facility] Adult PT Treatment:                                                DATE: 08/26/2021 Therapeutic Exercise: R PF 2x15 GTB R calf stretch with strap 2x30" R ankle inversion/eversion 2x10 GTB Seated heel raise x 10 - 10# KB STS 2x10 - no UE support  Standing calf stretch x 30" each Standing soleus stretch x 30" Manual Therapy: STM to R gastroc  [facility] Adult PT Treatment:                                                DATE: 08/11/2021 Therapeutic Exercise: R calf stretch with towel x 30" Standing calf stretch x 30" R ankle inversion/eversion x 5 RTB   PATIENT EDUCATION:  Education details: eval findings, LEFS, HEP, POC Person educated: Patient Education method: Explanation, Demonstration, and Handouts Education comprehension: verbalized understanding and returned demonstration     HOME EXERCISE PROGRAM: Access Code: [email or web address] URL: [email or web address] Date: 08/26/2021 Prepared by: Octavio Manns  Exercises - Long Sitting Calf Stretch with Strap  - 1-2 x daily - 7 x weekly - 2-3 reps - 30 sec hold - Gastroc Stretch on Wall  -  1-2 x daily - 7 x weekly - 2-3 reps - 30 sec hold - Standing Soleus Stretch  - 1 x daily - 7 x weekly - 2-3 reps - 30 sec hold - Ankle Inversion with Resistance  - 1-2 x daily - 7 x weekly - 2 sets - 10 reps - red theraband hold - Ankle  Eversion with Resistance  - 1-2 x daily - 7 x weekly - 2 sets - 10 reps - red theraband hold   ASSESSMENT:   CLINICAL IMPRESSION: Pt was able to complete all prescribed exercises with no adverse effect or increase in pain. Therapy today continued to focus on improving muscle length and distal LE muscle strength in order to decrease pain and improve comfort. She continues to progress well with therapy and will continue to be seen and progressed as able per POC.      OBJECTIVE IMPAIRMENTS Abnormal gait, decreased activity tolerance, decreased balance, difficulty walking, decreased ROM, decreased strength, and pain.    ACTIVITY LIMITATIONS community activity and occupation.    PERSONAL FACTORS Fitness and 1 comorbidity: Morbid obesity  are also affecting patient's functional outcome.      GOALS: Goals reviewed with patient? No   SHORT TERM GOALS: Target date: 09/01/2021    Pt will be compliant and knowledgeable with initial HEP for improved comfort and carryover Baseline: initial HEP given Goal status: MET   2.  Pt will self report right heel pain no greater than 6/10 for improved comfort and functional ability Baseline: 10/10 at worst Goal status: INITIAL   LONG TERM GOALS: Target date: 10/06/2021     Pt will self report right heel pain no greater than 6/10 for improved comfort and functional ability Baseline: 10/10 at worst Goal status: INITIAL   2.  Pt will improve LEFS to no less than 70/80 as proxy for functional improvement Baseline: 62/80 Goal status: INITIAL   3.  Pt will increase SLS time to no less than 25 seconds bilaterally Baseline: 6 seconds Goal status: INITIAL   4.  Pt will improve right ankle DF to no less than 12 deg for improved comfort and mobility Baseline: 8 deg  Goal status: INITIAL   PLAN: PT FREQUENCY: 2x/week   PT DURATION: 8 weeks   PLANNED INTERVENTIONS: Therapeutic exercises, Therapeutic activity, Neuromuscular re-education, Balance training,  Gait training, Patient/Family education, Joint mobilization, Aquatic Therapy, Dry Needling, Electrical stimulation, Cryotherapy, Moist heat, Vasopneumatic device, Manual therapy, and Re-evaluation   PLAN FOR NEXT SESSION: assess HEP response, ankle strengthening, calf stretching, vaso   Ward Chatters, PT 08/30/2021, 12:11 PM

## 2021-08-31 ENCOUNTER — Encounter: Payer: Self-pay | Admitting: *Deleted

## 2021-09-01 ENCOUNTER — Ambulatory Visit: Payer: Self-pay

## 2021-09-01 ENCOUNTER — Ambulatory Visit: Payer: Medicare Other

## 2021-09-01 DIAGNOSIS — R2689 Other abnormalities of gait and mobility: Secondary | ICD-10-CM

## 2021-09-01 DIAGNOSIS — M25571 Pain in right ankle and joints of right foot: Secondary | ICD-10-CM | POA: Diagnosis not present

## 2021-09-01 DIAGNOSIS — R600 Localized edema: Secondary | ICD-10-CM

## 2021-09-01 DIAGNOSIS — M6281 Muscle weakness (generalized): Secondary | ICD-10-CM

## 2021-09-01 NOTE — Therapy (Signed)
OUTPATIENT PHYSICAL THERAPY TREATMENT NOTE   Patient Name: Tiffany Velasquez MRN: [id number] DOB:May 24, 1995, 26 y.o., female Today's Date: 09/01/2021  PCP: Katha Cabal, DO REFERRING PROVIDER: Edwin Cap, DPM  END OF SESSION:   PT End of Session - 09/01/21 1128     Visit Number 4    Number of Visits 17    Date for PT Re-Evaluation 10/06/21    Authorization Type Medicare    PT Start Time 1130    PT Stop Time 1210    PT Time Calculation (min) 40 min    Activity Tolerance Patient tolerated treatment well    Behavior During Therapy WFL for tasks assessed/performed               Past Medical History:  Diagnosis Date   Asthma    Eczema    Morbid obesity ([facility])    Past Surgical History:  Procedure Laterality Date   CATARACT EXTRACTION     no past surgery     Patient Active Problem List   Diagnosis Date Noted   Pain of right heel 07/16/2021   Nasal congestion 03/16/2021   Iritis 07/26/2018   Seasonal and perennial allergic rhinitis 07/26/2018   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   High risk medication use 07/13/2018   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61 (ICD-10-CM) - Achilles tendinitis of right lower extremity M21.861 (ICD-10-CM) - Gastrocnemius equinus of right lower extremity  THERAPY DIAG:  Pain in right ankle and joints of right  foot  Muscle weakness (generalized)  Other abnormalities of gait and mobility  Localized edema  Rationale for Evaluation and Treatment Rehabilitation  PERTINENT HISTORY:  Morbid obesity  PRECAUTIONS:  None  SUBJECTIVE:  Pt presents to PT with continued R heel pain. Has been compliant with HEP with no adverse effect. Pt is ready to begin PT at this time.   PAIN:  Are you having pain?  Yes: NPRS scale: 3/10 (10/10 at worst) Pain location: R heel Pain description: sharp Aggravating factors: walking, standing Relieving factors: rest, Voltaren, medication   OBJECTIVE: (objective measures completed at initial evaluation unless otherwise dated)   PATIENT SURVEYS:  LEFS 62/80  - 78% function   LE ROM:   AROM Right 08/11/2021 Left 08/11/2021  Hip flexion       Hip extension      Hip abduction      Hip adduction      Hip internal rotation      Hip external rotation      Knee flexion      Knee extension      Ankle dorsiflexion 8 12  Ankle plantarflexion      Ankle inversion      Ankle eversion      (Blank rows = not tested)   LE MMT:   MMT Right 08/11/2021 Left 08/11/2021  Hip flexion       Hip extension  Hip abduction      Hip adduction      Hip external rotation      Hip internal rotation      Knee extension      Knee flexion      Ankle dorsiflexion  5/5 5/5  Ankle plantarflexion 5/5 5/5  Ankle inversion 3+/5 5/5  Ankle eversion 3+/5 4/5  Grossly      (Blank rows = not tested)     LOWER EXTREMITY SPECIAL TESTS:  N/A   FUNCTIONAL TESTS:  30 Second Sit to Stand: 8 reps - increased pain SLS: 6 seconds R   GAIT: Distance walked: 101ft Assistive device utilized: None Level of assistance: Complete Independence Comments: slight antalgic gait on R     TODAY'S TREATMENT: [facility] Adult PT Treatment:                                                DATE: 09/01/2021 Therapeutic Exercise: STS 2x10 - no UE support BAPS cw/ccw 2x5 each L3 R Slant board  stretch 2x30" Standing heel raise with tennis ball 2x15 Wobbleboard 2x10 fwd/bwd Seated heel raise 2x15 - 15# KB SLS 2x20" R R ankle inversion/eversion 2x15 GTB Manual Therapy: STM and trigger point to R gastroc  [facility] Adult PT Treatment:                                                DATE: 08/30/2021 Therapeutic Exercise: R PF 2x15 GTB R calf stretch with strap 2x30" R ankle inversion/eversion 2x10 GTB Seated heel raise 2x15 - 10# KB BAPS cw/ccw 2x5 each L3 R STS 2x10 - no UE support  Standing calf stretch 2x30" each Standing soleus stretch x 30" Standing heel raise with tennis ball x 10 Manual Therapy: STM and trigger point to R gastroc  [facility] Adult PT Treatment:                                                DATE: 08/26/2021 Therapeutic Exercise: R PF 2x15 GTB R calf stretch with strap 2x30" R ankle inversion/eversion 2x10 GTB Seated heel raise x 10 - 10# KB STS 2x10 - no UE support  Standing calf stretch x 30" each Standing soleus stretch x 30" Manual Therapy: STM to R gastroc  PATIENT EDUCATION:  Education details: eval findings, LEFS, HEP, POC Person educated: Patient Education method: Explanation, Demonstration, and Handouts Education comprehension: verbalized understanding and returned demonstration     HOME EXERCISE PROGRAM: Access Code: [email or web address] URL: [email or web address] Date: 08/26/2021 Prepared by: Octavio Manns  Exercises - Long Sitting Calf Stretch with Strap  - 1-2 x daily - 7 x weekly - 2-3 reps - 30 sec hold - Gastroc Stretch on Wall  - 1-2 x daily - 7 x weekly - 2-3 reps - 30 sec hold - Standing Soleus Stretch  - 1 x daily - 7 x weekly - 2-3 reps - 30 sec hold - Ankle Inversion with Resistance  - 1-2 x daily - 7 x weekly - 2 sets - 10 reps - red theraband hold - Ankle Eversion with Resistance  -  1-2 x daily - 7 x weekly - 2 sets - 10 reps - red theraband hold   ASSESSMENT:   CLINICAL IMPRESSION: Pt was able to complete all prescribed  exercises with no adverse effect or increase in pain. Therapy today focused on improving calf muscle length and LE strength. She continued to respond well to manual therapy interventions, noting decreased pain post session. Pt continues to benefit from skilled PT services and will continue to be seen and progressed as able.      OBJECTIVE IMPAIRMENTS Abnormal gait, decreased activity tolerance, decreased balance, difficulty walking, decreased ROM, decreased strength, and pain.    ACTIVITY LIMITATIONS community activity and occupation.    PERSONAL FACTORS Fitness and 1 comorbidity: Morbid obesity  are also affecting patient's functional outcome.      GOALS: Goals reviewed with patient? No   SHORT TERM GOALS: Target date: 09/01/2021    Pt will be compliant and knowledgeable with initial HEP for improved comfort and carryover Baseline: initial HEP given Goal status: MET   2.  Pt will self report right heel pain no greater than 6/10 for improved comfort and functional ability Baseline: 10/10 at worst Goal status: INITIAL   LONG TERM GOALS: Target date: 10/06/2021     Pt will self report right heel pain no greater than 6/10 for improved comfort and functional ability Baseline: 10/10 at worst Goal status: INITIAL   2.  Pt will improve LEFS to no less than 70/80 as proxy for functional improvement Baseline: 62/80 Goal status: INITIAL   3.  Pt will increase SLS time to no less than 25 seconds bilaterally Baseline: 6 seconds Goal status: INITIAL   4.  Pt will improve right ankle DF to no less than 12 deg for improved comfort and mobility Baseline: 8 deg  Goal status: INITIAL   PLAN: PT FREQUENCY: 2x/week   PT DURATION: 8 weeks   PLANNED INTERVENTIONS: Therapeutic exercises, Therapeutic activity, Neuromuscular re-education, Balance training, Gait training, Patient/Family education, Joint mobilization, Aquatic Therapy, Dry Needling, Electrical stimulation, Cryotherapy, Moist heat,  Vasopneumatic device, Manual therapy, and Re-evaluation   PLAN FOR NEXT SESSION: assess HEP response, ankle strengthening, calf stretching, vaso   Ward Chatters, PT 09/01/2021, 12:13 PM

## 2021-09-03 ENCOUNTER — Ambulatory Visit (INDEPENDENT_AMBULATORY_CARE_PROVIDER_SITE_OTHER): Payer: Medicare Other

## 2021-09-03 DIAGNOSIS — J309 Allergic rhinitis, unspecified: Secondary | ICD-10-CM

## 2021-09-07 ENCOUNTER — Ambulatory Visit (INDEPENDENT_AMBULATORY_CARE_PROVIDER_SITE_OTHER): Payer: Medicare Other | Admitting: Podiatry

## 2021-09-07 DIAGNOSIS — M7661 Achilles tendinitis, right leg: Secondary | ICD-10-CM

## 2021-09-08 ENCOUNTER — Ambulatory Visit: Payer: Medicare Other

## 2021-09-08 DIAGNOSIS — M25571 Pain in right ankle and joints of right foot: Secondary | ICD-10-CM

## 2021-09-08 DIAGNOSIS — M6281 Muscle weakness (generalized): Secondary | ICD-10-CM

## 2021-09-08 NOTE — Therapy (Signed)
OUTPATIENT PHYSICAL THERAPY TREATMENT NOTE   Patient Name: Tiffany Velasquez MRN: [id number] DOB:1995/09/09, 26 y.o., female Today's Date: 09/08/2021  PCP: Lyndee Hensen, DO REFERRING PROVIDER: Criselda Peaches, DPM  END OF SESSION:   PT End of Session - 09/08/21 1445     Visit Number 5    Number of Visits 17    Date for PT Re-Evaluation 10/06/21    Authorization Type Medicare    PT Start Time 1445    PT Stop Time 1525    PT Time Calculation (min) 40 min    Activity Tolerance Patient tolerated treatment well    Behavior During Therapy [facility] for tasks assessed/performed                Past Medical History:  Diagnosis Date   Asthma    Eczema    Morbid obesity ([place])    Past Surgical History:  Procedure Laterality Date   CATARACT EXTRACTION     no past surgery     Patient Active Problem List   Diagnosis Date Noted   Pain of right heel 07/16/2021   Nasal congestion 03/16/2021   Iritis 07/26/2018   Seasonal and perennial allergic rhinitis 07/26/2018   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   High risk medication use 07/13/2018   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   Retinal edema 07/13/2018   Adverse food reaction 06/15/2017   Moderate persistent asthma without complication 37/06/8887   Low back pain 12/22/2015   Food allergy 12/06/2014   Healthcare maintenance 04/12/2014   Asthma, chronic 06/22/2012   Family history of sudden cardiac death 26-Aug-2011   Morbid obesity ([place]) 10/05/2009   CONSTIPATION, CHRONIC 04/16/2008   ATTENTION DEFICIT, W/HYPERACTIVITY 05/25/2006   ALLERGIC CONJUNCTIVITIS 05/25/2006   Allergic rhinitis 05/25/2006   Severe eczema 05/25/2006    REFERRING DIAG:  M76.61 (ICD-10-CM) - Achilles tendinitis of right lower extremity M21.861 (ICD-10-CM) - Gastrocnemius equinus of right lower extremity  THERAPY DIAG:  Pain in right ankle and joints of  right foot  Muscle weakness (generalized)  Rationale for Evaluation and Treatment Rehabilitation  PERTINENT HISTORY:  Morbid obesity  PRECAUTIONS:  None  SUBJECTIVE:  Pt presents to PT with reports of slight heel discomfort. Has been compliant with HEP with no adverse effect. Had a positive f/u with podiatry yesterday. She is ready to begin PT at this time.   PAIN:  Are you having pain?  Yes: NPRS scale: 5/10 (10/10 at worst) Pain location: R heel Pain description: sharp Aggravating factors: walking, standing Relieving factors: rest, Voltaren, medication   OBJECTIVE: (objective measures completed at initial evaluation unless otherwise dated)   PATIENT SURVEYS:  LEFS 62/80  - 78% function   LE ROM:   AROM Right 08/11/2021 Left 08/11/2021  Hip flexion       Hip extension      Hip abduction      Hip adduction      Hip internal rotation      Hip external rotation      Knee flexion      Knee extension      Ankle dorsiflexion 8 12  Ankle plantarflexion      Ankle inversion      Ankle eversion      (Blank rows = not tested)   LE MMT:   MMT Right 08/11/2021 Left 08/11/2021  Hip flexion       Hip extension  Hip abduction      Hip adduction      Hip external rotation      Hip internal rotation      Knee extension      Knee flexion      Ankle dorsiflexion  5/5 5/5  Ankle plantarflexion 5/5 5/5  Ankle inversion 3+/5 5/5  Ankle eversion 3+/5 4/5  Grossly      (Blank rows = not tested)     LOWER EXTREMITY SPECIAL TESTS:  N/A   FUNCTIONAL TESTS:  30 Second Sit to Stand: 8 reps - increased pain SLS: 6 seconds R   GAIT: Distance walked: 96f Assistive device utilized: None Level of assistance: Complete Independence Comments: slight antalgic gait on R     TODAY'S TREATMENT: [facility] Adult PT Treatment:                                                DATE: 09/08/2021 Therapeutic Exercise: STS 2x10 - holding 15# KB BAPS cw/ccw 2x5 each L3 R Slant board  stretch 2x30" Standing heel raise with tennis ball 2x15 Eccentric heel lowering x 10 Wobbleboard 2x10 fwd/bwd Seated heel raise 2x15 - 15# KB SLS 2x20" R R ankle inversion/eversion 2x15 GTB Manual Therapy: STM and trigger point to R gastroc  [facility] Adult PT Treatment:                                                DATE: 09/01/2021 Therapeutic Exercise: STS 2x10 - no UE support BAPS cw/ccw 2x5 each L3 R Slant board stretch 2x30" Standing heel raise with tennis ball 2x15 Wobbleboard 2x10 fwd/bwd Seated heel raise 2x15 - 15# KB SLS 2x20" R R ankle inversion/eversion 2x15 GTB Manual Therapy: STM and trigger point to R gastroc  [facility] Adult PT Treatment:                                                DATE: 08/30/2021 Therapeutic Exercise: R PF 2x15 GTB R calf stretch with strap 2x30" R ankle inversion/eversion 2x10 GTB Seated heel raise 2x15 - 10# KB BAPS cw/ccw 2x5 each L3 R STS 2x10 - no UE support  Standing calf stretch 2x30" each Standing soleus stretch x 30" Standing heel raise with tennis ball x 10 Manual Therapy: STM and trigger point to R gastroc  [facility] Adult PT Treatment:                                                DATE: 08/26/2021 Therapeutic Exercise: R PF 2x15 GTB R calf stretch with strap 2x30" R ankle inversion/eversion 2x10 GTB Seated heel raise x 10 - 10# KB STS 2x10 - no UE support  Standing calf stretch x 30" each Standing soleus stretch x 30" Manual Therapy: STM to R gastroc  PATIENT EDUCATION:  Education details: eval findings, LEFS, HEP, POC Person educated: Patient Education method: Explanation, Demonstration, and Handouts Education comprehension: verbalized understanding and returned demonstration  HOME EXERCISE PROGRAM: Access Code: [email or web address] URL: [email or web address] Date: 08/26/2021 Prepared by: Octavio Manns  Exercises - Long Sitting Calf Stretch with Strap  - 1-2 x daily - 7 x weekly - 2-3 reps - 30 sec hold - Gastroc  Stretch on Wall  - 1-2 x daily - 7 x weekly - 2-3 reps - 30 sec hold - Standing Soleus Stretch  - 1 x daily - 7 x weekly - 2-3 reps - 30 sec hold - Ankle Inversion with Resistance  - 1-2 x daily - 7 x weekly - 2 sets - 10 reps - red theraband hold - Ankle Eversion with Resistance  - 1-2 x daily - 7 x weekly - 2 sets - 10 reps - red theraband hold   ASSESSMENT:   CLINICAL IMPRESSION: Pt was able to complete all prescribed exercises with no adverse effect or increase in pain. Therapy continued to progress LE strength with noted improvement in ankle motor control. She continues to benefit from skilled PT and will continue to be seen and progressed as able.      OBJECTIVE IMPAIRMENTS Abnormal gait, decreased activity tolerance, decreased balance, difficulty walking, decreased ROM, decreased strength, and pain.    ACTIVITY LIMITATIONS community activity and occupation.    PERSONAL FACTORS Fitness and 1 comorbidity: Morbid obesity  are also affecting patient's functional outcome.      GOALS: Goals reviewed with patient? No   SHORT TERM GOALS: Target date: 09/01/2021    Pt will be compliant and knowledgeable with initial HEP for improved comfort and carryover Baseline: initial HEP given Goal status: MET   2.  Pt will self report right heel pain no greater than 6/10 for improved comfort and functional ability Baseline: 10/10 at worst Goal status: INITIAL   LONG TERM GOALS: Target date: 10/06/2021     Pt will self report right heel pain no greater than 6/10 for improved comfort and functional ability Baseline: 10/10 at worst Goal status: INITIAL   2.  Pt will improve LEFS to no less than 70/80 as proxy for functional improvement Baseline: 62/80 Goal status: INITIAL   3.  Pt will increase SLS time to no less than 25 seconds bilaterally Baseline: 6 seconds Goal status: INITIAL   4.  Pt will improve right ankle DF to no less than 12 deg for improved comfort and mobility Baseline: 8 deg   Goal status: INITIAL   PLAN: PT FREQUENCY: 2x/week   PT DURATION: 8 weeks   PLANNED INTERVENTIONS: Therapeutic exercises, Therapeutic activity, Neuromuscular re-education, Balance training, Gait training, Patient/Family education, Joint mobilization, Aquatic Therapy, Dry Needling, Electrical stimulation, Cryotherapy, Moist heat, Vasopneumatic device, Manual therapy, and Re-evaluation   PLAN FOR NEXT SESSION: assess HEP response, ankle strengthening, calf stretching, vaso   Ward Chatters, PT 09/08/2021, 3:25 PM

## 2021-09-10 NOTE — Progress Notes (Signed)
  Subjective:  Patient ID: Tiffany Velasquez, female    DOB: [birth year],  MRN: [id number]  Worsening Achilles pain for about 1 month she works at a daycare in school and has been using Voltaren gel she was referred to me by her uncle Janit Pagan  26 y.o. female presents with the above complaint. History confirmed with patient.  She is doing well, has had quite a bit of improvement nearly fully resolved  Objective:  Physical Exam: warm, good capillary refill, no trophic changes or ulcerative lesions, normal DP and PT pulses, and normal sensory exam. Left Foot: normal exam, no swelling, tenderness, instability; ligaments intact, full range of motion of all ankle/foot joints Right Foot: Little to no tenderness at Achilles tendon midsubstance there is still gastrocnemius equinus is noted with a positive silverskiold test   Radiographs: Multiple views x-ray of the right foot: no fracture, dislocation, swelling or degenerative changes noted and posterior calcaneal spur is noted Assessment:   1. Achilles tendinitis of right lower extremity      Plan:  Patient was evaluated and treated and all questions answered.  Doing very well has had near complete resolution with home therapy.  I recommend she continue to use this as well as the Voltaren gel as needed until her symptoms completely resolved.  She will return to see me as needed.  Return if symptoms worsen or fail to improve.

## 2021-09-14 ENCOUNTER — Ambulatory Visit (INDEPENDENT_AMBULATORY_CARE_PROVIDER_SITE_OTHER): Payer: Medicare Other

## 2021-09-14 DIAGNOSIS — J309 Allergic rhinitis, unspecified: Secondary | ICD-10-CM | POA: Diagnosis not present

## 2021-09-15 ENCOUNTER — Ambulatory Visit: Payer: Medicare Other

## 2021-09-15 DIAGNOSIS — M25571 Pain in right ankle and joints of right foot: Secondary | ICD-10-CM | POA: Diagnosis not present

## 2021-09-15 DIAGNOSIS — M6281 Muscle weakness (generalized): Secondary | ICD-10-CM

## 2021-09-15 DIAGNOSIS — J301 Allergic rhinitis due to pollen: Secondary | ICD-10-CM

## 2021-09-15 DIAGNOSIS — R2689 Other abnormalities of gait and mobility: Secondary | ICD-10-CM

## 2021-09-15 DIAGNOSIS — R600 Localized edema: Secondary | ICD-10-CM

## 2021-09-15 NOTE — Therapy (Signed)
OUTPATIENT PHYSICAL THERAPY TREATMENT NOTE   Patient Name: Tiffany Velasquez MRN: [id number] DOB:1995/07/16, 26 y.o., female Today's Date: 09/15/2021  PCP: Lyndee Hensen, DO REFERRING PROVIDER: Criselda Peaches, DPM  END OF SESSION:   PT End of Session - 09/15/21 1442     Visit Number 6    Number of Visits 17    Date for PT Re-Evaluation 10/06/21    Authorization Type Medicare    PT Start Time 1445    PT Stop Time 1525    PT Time Calculation (min) 40 min    Activity Tolerance Patient tolerated treatment well    Behavior During Therapy WFL for tasks assessed/performed                 Past Medical History:  Diagnosis Date   Asthma    Eczema    Morbid obesity ([place])    Past Surgical History:  Procedure Laterality Date   CATARACT EXTRACTION     no past surgery     Patient Active Problem List   Diagnosis Date Noted   Pain of right heel 07/16/2021   Nasal congestion 03/16/2021   Iritis 07/26/2018   Seasonal and perennial allergic rhinitis 07/26/2018   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   High risk medication use 07/13/2018   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   Retinal edema 07/13/2018   Adverse food reaction 06/15/2017   Moderate persistent asthma without complication 09/18/7626   Low back pain 12/22/2015   Food allergy 12/06/2014   Healthcare maintenance 04/12/2014   Asthma, chronic 06/22/2012   Family history of sudden cardiac death Aug 06, 2011   Morbid obesity ([place]) 10/05/2009   CONSTIPATION, CHRONIC 04/16/2008   ATTENTION DEFICIT, W/HYPERACTIVITY 05/25/2006   ALLERGIC CONJUNCTIVITIS 05/25/2006   Allergic rhinitis 05/25/2006   Severe eczema 05/25/2006    REFERRING DIAG:  M76.61 (ICD-10-CM) - Achilles tendinitis of right lower extremity M21.861 (ICD-10-CM) - Gastrocnemius equinus of right lower extremity  THERAPY DIAG:  Pain in right ankle and joints of  right foot  Muscle weakness (generalized)  Other abnormalities of gait and mobility  Localized edema  Rationale for Evaluation and Treatment Rehabilitation  PERTINENT HISTORY:  Morbid obesity  PRECAUTIONS:  None  SUBJECTIVE: Patient presents to PT with continued pain, though less. States it feels more tender and achy than painful.  PAIN:  Are you having pain?  Yes: NPRS scale: 3/10 (4/10 at worst the past couple days) Pain location: R heel Pain description: sharp Aggravating factors: walking, standing Relieving factors: rest, Voltaren, medication   OBJECTIVE: (objective measures completed at initial evaluation unless otherwise dated)   PATIENT SURVEYS:  LEFS 62/80  - 78% function   LE ROM:   AROM Right 08/11/2021 Left 08/11/2021  Hip flexion       Hip extension      Hip abduction      Hip adduction      Hip internal rotation      Hip external rotation      Knee flexion      Knee extension      Ankle dorsiflexion 8 12  Ankle plantarflexion      Ankle inversion      Ankle eversion      (Blank rows = not tested)   LE MMT:   MMT Right 08/11/2021 Left 08/11/2021  Hip flexion       Hip extension      Hip abduction  Hip adduction      Hip external rotation      Hip internal rotation      Knee extension      Knee flexion      Ankle dorsiflexion  5/5 5/5  Ankle plantarflexion 5/5 5/5  Ankle inversion 3+/5 5/5  Ankle eversion 3+/5 4/5  Grossly      (Blank rows = not tested)     LOWER EXTREMITY SPECIAL TESTS:  N/A   FUNCTIONAL TESTS:  30 Second Sit to Stand: 8 reps - increased pain SLS: 6 seconds R   GAIT: Distance walked: 34ft Assistive device utilized: None Level of assistance: Complete Independence Comments: slight antalgic gait on R     TODAY'S TREATMENT: [facility] Adult PT Treatment:                                                DATE: 09/15/2021 Therapeutic Exercise: STS 2x10 - holding 15# KB BAPS cw/ccw 2x5 each L3 R Slant board stretch  2x30" Standing heel raise with tennis ball 2x15 Eccentric heel lowering x 10 Wobbleboard 2x10 fwd/bwd Seated heel raise 2x15 - 15# KB SLS 3x20" R R ankle inversion/eversion 2x15 GTB   [facility] Adult PT Treatment:                                                DATE: 09/08/2021 Therapeutic Exercise: STS 2x10 - holding 15# KB BAPS cw/ccw 2x5 each L3 R Slant board stretch 2x30" Standing heel raise with tennis ball 2x15 Eccentric heel lowering x 10 Wobbleboard 2x10 fwd/bwd Seated heel raise 2x15 - 15# KB SLS 2x20" R R ankle inversion/eversion 2x15 GTB Manual Therapy: STM and trigger point to R gastroc  [facility] Adult PT Treatment:                                                DATE: 09/01/2021 Therapeutic Exercise: STS 2x10 - no UE support BAPS cw/ccw 2x5 each L3 R Slant board stretch 2x30" Standing heel raise with tennis ball 2x15 Wobbleboard 2x10 fwd/bwd Seated heel raise 2x15 - 15# KB SLS 2x20" R R ankle inversion/eversion 2x15 GTB Manual Therapy: STM and trigger point to R gastroc  PATIENT EDUCATION:  Education details: eval findings, LEFS, HEP, POC Person educated: Patient Education method: Explanation, Demonstration, and Handouts Education comprehension: verbalized understanding and returned demonstration     HOME EXERCISE PROGRAM: Access Code: [email or web address] URL: [email or web address] Date: 08/26/2021 Prepared by: Octavio Manns  Exercises - Long Sitting Calf Stretch with Strap  - 1-2 x daily - 7 x weekly - 2-3 reps - 30 sec hold - Gastroc Stretch on Wall  - 1-2 x daily - 7 x weekly - 2-3 reps - 30 sec hold - Standing Soleus Stretch  - 1 x daily - 7 x weekly - 2-3 reps - 30 sec hold - Ankle Inversion with Resistance  - 1-2 x daily - 7 x weekly - 2 sets - 10 reps - red theraband hold - Ankle Eversion with Resistance  - 1-2 x daily - 7 x weekly - 2 sets - 10 reps -  red theraband hold   ASSESSMENT:   CLINICAL IMPRESSION: Patient presents to PT with lessened  overall ankle pain and reports HEP compliance. Session today focused on LE and ankle strengthening with good tolerance, only needing an occasional rest break for standing exercises. Patient was able to tolerate all prescribed exercises with no adverse effects. Patient continues to benefit from skilled PT services and should be progressed as able to improve functional independence.     OBJECTIVE IMPAIRMENTS Abnormal gait, decreased activity tolerance, decreased balance, difficulty walking, decreased ROM, decreased strength, and pain.    ACTIVITY LIMITATIONS community activity and occupation.    PERSONAL FACTORS Fitness and 1 comorbidity: Morbid obesity  are also affecting patient's functional outcome.      GOALS: Goals reviewed with patient? No   SHORT TERM GOALS: Target date: 09/01/2021    Pt will be compliant and knowledgeable with initial HEP for improved comfort and carryover Baseline: initial HEP given Goal status: MET   2.  Pt will self report right heel pain no greater than 6/10 for improved comfort and functional ability Baseline: 10/10 at worst Goal status: INITIAL   LONG TERM GOALS: Target date: 10/06/2021     Pt will self report right heel pain no greater than 6/10 for improved comfort and functional ability Baseline: 10/10 at worst Goal status: INITIAL   2.  Pt will improve LEFS to no less than 70/80 as proxy for functional improvement Baseline: 62/80 Goal status: INITIAL   3.  Pt will increase SLS time to no less than 25 seconds bilaterally Baseline: 6 seconds Goal status: INITIAL   4.  Pt will improve right ankle DF to no less than 12 deg for improved comfort and mobility Baseline: 8 deg  Goal status: INITIAL   PLAN: PT FREQUENCY: 2x/week   PT DURATION: 8 weeks   PLANNED INTERVENTIONS: Therapeutic exercises, Therapeutic activity, Neuromuscular re-education, Balance training, Gait training, Patient/Family education, Joint mobilization, Aquatic Therapy, Dry  Needling, Electrical stimulation, Cryotherapy, Moist heat, Vasopneumatic device, Manual therapy, and Re-evaluation   PLAN FOR NEXT SESSION: assess HEP response, ankle strengthening, calf stretching, vaso   Evelene Croon, PTA 09/15/2021, 3:26 PM

## 2021-09-15 NOTE — Progress Notes (Signed)
VIALS EXP 09-16-22 

## 2021-09-16 DIAGNOSIS — J3089 Other allergic rhinitis: Secondary | ICD-10-CM

## 2021-09-22 ENCOUNTER — Ambulatory Visit: Payer: Medicare Other

## 2021-09-29 ENCOUNTER — Ambulatory Visit: Payer: Medicare Other | Attending: Podiatry

## 2021-09-29 DIAGNOSIS — M25571 Pain in right ankle and joints of right foot: Secondary | ICD-10-CM | POA: Insufficient documentation

## 2021-09-29 DIAGNOSIS — R2689 Other abnormalities of gait and mobility: Secondary | ICD-10-CM | POA: Insufficient documentation

## 2021-09-29 DIAGNOSIS — M6281 Muscle weakness (generalized): Secondary | ICD-10-CM | POA: Insufficient documentation

## 2021-09-29 NOTE — Therapy (Signed)
OUTPATIENT PHYSICAL THERAPY TREATMENT NOTE/DISCHARGE SUMMARY  PHYSICAL THERAPY DISCHARGE SUMMARY  Visits from Start of Care: 7  Current functional level related to goals / functional outcomes: See goals and objective   Remaining deficits: See goals and objective   Education / Equipment: HEP   Patient agrees to discharge. Patient goals were met. Patient is being discharged due to meeting the stated rehab goals.   Patient Name: Tiffany Velasquez MRN: [id number] DOB:April 22, 1995, 26 y.o., female Today's Date: 09/29/2021  PCP: Lyndee Hensen, DO REFERRING PROVIDER: Criselda Peaches, DPM  END OF SESSION:   PT End of Session - 09/29/21 1132     Visit Number 7    Number of Visits 17    Date for PT Re-Evaluation 10/06/21    Authorization Type Medicare    PT Start Time 1132    PT Stop Time 1157    PT Time Calculation (min) 25 min    Activity Tolerance Patient tolerated treatment well    Behavior During Therapy WFL for tasks assessed/performed                  Past Medical History:  Diagnosis Date   Asthma    Eczema    Morbid obesity ([place])    Past Surgical History:  Procedure Laterality Date   CATARACT EXTRACTION     no past surgery     Patient Active Problem List   Diagnosis Date Noted   Pain of right heel 07/16/2021   Nasal congestion 03/16/2021   Iritis 07/26/2018   Seasonal and perennial allergic rhinitis 07/26/2018   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   High risk medication use 07/13/2018   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   Retinal edema 07/13/2018   Adverse food reaction 06/15/2017   Moderate persistent asthma without complication 33/35/4562   Low back pain 12/22/2015   Food allergy 12/06/2014   Healthcare maintenance 04/12/2014   Asthma, chronic 06/22/2012   Family history of sudden cardiac death 2011/08/30   Morbid obesity ([place]) 10/05/2009    CONSTIPATION, CHRONIC 04/16/2008   ATTENTION DEFICIT, W/HYPERACTIVITY 05/25/2006   ALLERGIC CONJUNCTIVITIS 05/25/2006   Allergic rhinitis 05/25/2006   Severe eczema 05/25/2006    REFERRING DIAG:  M76.61 (ICD-10-CM) - Achilles tendinitis of right lower extremity M21.861 (ICD-10-CM) - Gastrocnemius equinus of right lower extremity  THERAPY DIAG:  Pain in right ankle and joints of right foot  Muscle weakness (generalized)  Other abnormalities of gait and mobility  Rationale for Evaluation and Treatment Rehabilitation  PERTINENT HISTORY:  Morbid obesity  PRECAUTIONS:  None  SUBJECTIVE:  Pt presents to PT with no current reports of R heel pain. Has been compliant with HEP with no adverse effect. She feels she is doing really well at the moment in terms of functional ability with R foot. She is ready to begin PT at this time.   PAIN:  Are you having pain?  Yes: NPRS scale: 0/10 (4/10 at worst the past couple days) Pain location: R heel Pain description: sharp Aggravating factors: walking, standing Relieving factors: rest, Voltaren, medication   OBJECTIVE: (objective measures completed at initial evaluation unless otherwise dated)   PATIENT SURVEYS:  LEFS 71/80  - 89% function   LE ROM:   AROM Right 08/11/2021 Right 09/29/2021  Hip flexion       Hip extension      Hip abduction      Hip adduction  Hip internal rotation      Hip external rotation      Knee flexion      Knee extension      Ankle dorsiflexion 8 10  Ankle plantarflexion      Ankle inversion      Ankle eversion      (Blank rows = not tested)   LE MMT:   MMT Right 08/11/2021 Left 08/11/2021  Hip flexion       Hip extension      Hip abduction      Hip adduction      Hip external rotation      Hip internal rotation      Knee extension      Knee flexion      Ankle dorsiflexion  5/5 5/5  Ankle plantarflexion 5/5 5/5  Ankle inversion 3+/5 5/5  Ankle eversion 3+/5 4/5  Grossly      (Blank  rows = not tested)     LOWER EXTREMITY SPECIAL TESTS:  N/A   FUNCTIONAL TESTS:  30 Second Sit to Stand: 8 reps - increased pain SLS: 15 seconds R   GAIT: Distance walked: 70ft Assistive device utilized: None Level of assistance: Complete Independence Comments: slight antalgic gait on R     TODAY'S TREATMENT: [facility] Adult PT Treatment:                                                DATE: 09/29/2021 Therapeutic Exercise: Ankle 4-way x 15 GTB SLS x 30" R Standing calf stretch x 30" R Standing soleus stretch x 30" R Standing heel raises with ball btwn heels x 15  Calf stretch with towel x 30" Therapeutic Activity: Assessment of tests/measures, goals, and outcomes for discharge  [facility] Adult PT Treatment:                                                DATE: 09/15/2021 Therapeutic Exercise: STS 2x10 - holding 15# KB BAPS cw/ccw 2x5 each L3 R Slant board stretch 2x30" Standing heel raise with tennis ball 2x15 Eccentric heel lowering x 10 Wobbleboard 2x10 fwd/bwd Seated heel raise 2x15 - 15# KB SLS 3x20" R R ankle inversion/eversion 2x15 GTB  [facility] Adult PT Treatment:                                                DATE: 09/08/2021 Therapeutic Exercise: STS 2x10 - holding 15# KB BAPS cw/ccw 2x5 each L3 R Slant board stretch 2x30" Standing heel raise with tennis ball 2x15 Eccentric heel lowering x 10 Wobbleboard 2x10 fwd/bwd Seated heel raise 2x15 - 15# KB SLS 2x20" R R ankle inversion/eversion 2x15 GTB Manual Therapy: STM and trigger point to R gastroc  PATIENT EDUCATION:  Education details: HEP and discharge plan Person educated: Patient Education method: Explanation, Demonstration, and Handouts Education comprehension: verbalized understanding and returned demonstration     HOME EXERCISE PROGRAM: Access Code: [email or web address] URL: [email or web address] Date: 09/29/2021 Prepared by: Octavio Manns  Exercises - Long Sitting Calf Stretch with Strap  - 1-2 x daily  - 7 x weekly -  2-3 reps - 30-60 sec hold - Gastroc Stretch on Wall  - 1-2 x daily - 7 x weekly - 2-3 reps - 30 sec hold - Standing Soleus Stretch  - 1-2 x daily - 7 x weekly - 2-3 reps - 30 sec hold - Ankle Dorsiflexion with Resistance  - 4 x weekly - 2-3 sets - 15 reps - green theraband hold - Ankle Inversion with Resistance  - 4 x weekly - 2-3 sets - 15 reps - green theraband hold - Ankle Eversion with Resistance  - 4 x weekly - 2-3 sets - 10 reps - green theraband hold - Ankle and Toe Plantarflexion with Resistance  - 4 x weekly - 2-3 sets - 15 reps - green theraband hold - Single Leg Stance  - 4 x weekly - 2-3 reps - 30 sec hold - Standing Calf Raise With Small Ball at Heels  - 4 x weekly - 2-3 sets - 15 reps   ASSESSMENT:   CLINICAL IMPRESSION: Pt was able to complete all prescribed exercises and demonstrated knowledge of HEP with no adverse effect. Over the course of PT treatment she has progressed very well with decreased pain and improved muscle length/functional mobility. She should continue to improve with HEP compliance and no longer requires skilled PT services at this time.    OBJECTIVE IMPAIRMENTS Abnormal gait, decreased activity tolerance, decreased balance, difficulty walking, decreased ROM, decreased strength, and pain.    ACTIVITY LIMITATIONS community activity and occupation.    PERSONAL FACTORS Fitness and 1 comorbidity: Morbid obesity  are also affecting patient's functional outcome.      GOALS: Goals reviewed with patient? No   SHORT TERM GOALS: Target date: 09/01/2021    Pt will be compliant and knowledgeable with initial HEP for improved comfort and carryover Baseline: initial HEP given Goal status: MET   2.  Pt will self report right heel pain no greater than 6/10 for improved comfort and functional ability Baseline: 10/10 at worst Goal status: MET   LONG TERM GOALS: Target date: 10/06/2021     Pt will self report right heel pain no greater than 6/10 for  improved comfort and functional ability Baseline: 10/10 at worst Goal status: MET   2.  Pt will improve LEFS to no less than 70/80 as proxy for functional improvement Baseline: 62/80 09/29/2021: 71/80 Goal status: MET   3.  Pt will increase SLS time to no less than 25 seconds bilaterally Baseline: 6 seconds 09/29/2021: 15 seconds Goal status: PARTIALLY MET   4.  Pt will improve right ankle DF to no less than 12 deg for improved comfort and mobility Baseline: 8 deg  09/29/2021: 10 deg Goal status: MET   PLAN: PT FREQUENCY: 2x/week   PT DURATION: 8 weeks   PLANNED INTERVENTIONS: Therapeutic exercises, Therapeutic activity, Neuromuscular re-education, Balance training, Gait training, Patient/Family education, Joint mobilization, Aquatic Therapy, Dry Needling, Electrical stimulation, Cryotherapy, Moist heat, Vasopneumatic device, Manual therapy, and Re-evaluation   PLAN FOR NEXT SESSION: assess HEP response, ankle strengthening, calf stretching, vaso   Ward Chatters, PT 09/29/2021, 12:14 PM

## 2021-09-30 ENCOUNTER — Ambulatory Visit (INDEPENDENT_AMBULATORY_CARE_PROVIDER_SITE_OTHER): Payer: Medicare Other

## 2021-09-30 DIAGNOSIS — J309 Allergic rhinitis, unspecified: Secondary | ICD-10-CM | POA: Diagnosis not present

## 2021-10-11 ENCOUNTER — Ambulatory Visit (INDEPENDENT_AMBULATORY_CARE_PROVIDER_SITE_OTHER): Payer: Medicare Other

## 2021-10-11 DIAGNOSIS — J309 Allergic rhinitis, unspecified: Secondary | ICD-10-CM

## 2021-10-22 ENCOUNTER — Ambulatory Visit (INDEPENDENT_AMBULATORY_CARE_PROVIDER_SITE_OTHER): Payer: Medicare Other | Admitting: *Deleted

## 2021-10-22 DIAGNOSIS — J309 Allergic rhinitis, unspecified: Secondary | ICD-10-CM

## 2021-10-29 ENCOUNTER — Ambulatory Visit (INDEPENDENT_AMBULATORY_CARE_PROVIDER_SITE_OTHER): Payer: Medicare Other

## 2021-10-29 DIAGNOSIS — J309 Allergic rhinitis, unspecified: Secondary | ICD-10-CM

## 2021-11-05 ENCOUNTER — Ambulatory Visit (INDEPENDENT_AMBULATORY_CARE_PROVIDER_SITE_OTHER): Payer: Medicare Other

## 2021-11-05 DIAGNOSIS — J309 Allergic rhinitis, unspecified: Secondary | ICD-10-CM

## 2021-11-15 ENCOUNTER — Ambulatory Visit (INDEPENDENT_AMBULATORY_CARE_PROVIDER_SITE_OTHER): Payer: Medicare Other | Admitting: *Deleted

## 2021-11-15 DIAGNOSIS — J309 Allergic rhinitis, unspecified: Secondary | ICD-10-CM

## 2021-12-10 ENCOUNTER — Ambulatory Visit (INDEPENDENT_AMBULATORY_CARE_PROVIDER_SITE_OTHER): Payer: Medicare Other | Admitting: *Deleted

## 2021-12-10 DIAGNOSIS — J309 Allergic rhinitis, unspecified: Secondary | ICD-10-CM

## 2021-12-27 ENCOUNTER — Ambulatory Visit (INDEPENDENT_AMBULATORY_CARE_PROVIDER_SITE_OTHER): Payer: Medicare Other

## 2021-12-27 DIAGNOSIS — J309 Allergic rhinitis, unspecified: Secondary | ICD-10-CM | POA: Diagnosis not present

## 2022-01-13 ENCOUNTER — Ambulatory Visit (INDEPENDENT_AMBULATORY_CARE_PROVIDER_SITE_OTHER): Payer: Medicare Other | Admitting: *Deleted

## 2022-01-13 DIAGNOSIS — J309 Allergic rhinitis, unspecified: Secondary | ICD-10-CM

## 2022-01-28 ENCOUNTER — Ambulatory Visit (INDEPENDENT_AMBULATORY_CARE_PROVIDER_SITE_OTHER): Payer: Medicare Other

## 2022-01-28 DIAGNOSIS — J309 Allergic rhinitis, unspecified: Secondary | ICD-10-CM | POA: Diagnosis not present

## 2022-02-08 NOTE — Progress Notes (Signed)
VIALS EXP 02-09-23 

## 2022-02-09 DIAGNOSIS — J301 Allergic rhinitis due to pollen: Secondary | ICD-10-CM | POA: Diagnosis not present

## 2022-02-10 ENCOUNTER — Ambulatory Visit (INDEPENDENT_AMBULATORY_CARE_PROVIDER_SITE_OTHER): Payer: Medicare Other

## 2022-02-10 DIAGNOSIS — Z23 Encounter for immunization: Secondary | ICD-10-CM | POA: Diagnosis present

## 2022-02-10 DIAGNOSIS — J3089 Other allergic rhinitis: Secondary | ICD-10-CM | POA: Diagnosis not present

## 2022-02-10 NOTE — Progress Notes (Signed)
   Covid-19 Vaccination Clinic  Name:  LACHELE LIEVANOS    MRN: [id number] DOB: 27-Jan-1996  02/10/2022  Patient presents to nurse clinic for COVID vaccination. Administered in LD, site unremarkable, tolerated injection well.   Ms. Demedeiros was observed post Covid-19 immunization for 15 minutes without incident. She was provided with Vaccine Information Sheet and instruction to access the V-Safe system.   Ms. Correll was instructed to call 911 with any severe reactions post vaccine: Difficulty breathing  Swelling of face and throat  A fast heartbeat  A bad rash all over body  Dizziness and weakness    Veronda Prude, RN

## 2022-02-22 ENCOUNTER — Ambulatory Visit (INDEPENDENT_AMBULATORY_CARE_PROVIDER_SITE_OTHER): Payer: Medicare Other | Admitting: *Deleted

## 2022-02-22 DIAGNOSIS — J309 Allergic rhinitis, unspecified: Secondary | ICD-10-CM | POA: Diagnosis not present

## 2022-02-27 DIAGNOSIS — H209 Unspecified iridocyclitis: Secondary | ICD-10-CM | POA: Insufficient documentation

## 2022-02-27 HISTORY — DX: Unspecified iridocyclitis: H20.9

## 2022-03-11 ENCOUNTER — Ambulatory Visit (INDEPENDENT_AMBULATORY_CARE_PROVIDER_SITE_OTHER): Payer: Medicare Other

## 2022-03-11 DIAGNOSIS — J309 Allergic rhinitis, unspecified: Secondary | ICD-10-CM

## 2022-03-23 ENCOUNTER — Ambulatory Visit (INDEPENDENT_AMBULATORY_CARE_PROVIDER_SITE_OTHER): Payer: Medicare Other

## 2022-03-23 DIAGNOSIS — J309 Allergic rhinitis, unspecified: Secondary | ICD-10-CM | POA: Diagnosis not present

## 2022-03-29 ENCOUNTER — Ambulatory Visit (INDEPENDENT_AMBULATORY_CARE_PROVIDER_SITE_OTHER): Payer: Medicare HMO

## 2022-03-29 DIAGNOSIS — J309 Allergic rhinitis, unspecified: Secondary | ICD-10-CM

## 2022-04-07 ENCOUNTER — Ambulatory Visit (INDEPENDENT_AMBULATORY_CARE_PROVIDER_SITE_OTHER): Payer: Medicare HMO

## 2022-04-07 DIAGNOSIS — J309 Allergic rhinitis, unspecified: Secondary | ICD-10-CM | POA: Diagnosis not present

## 2022-04-11 DIAGNOSIS — H30033 Focal chorioretinal inflammation, peripheral, bilateral: Secondary | ICD-10-CM | POA: Diagnosis not present

## 2022-04-13 DIAGNOSIS — H30033 Focal chorioretinal inflammation, peripheral, bilateral: Secondary | ICD-10-CM | POA: Diagnosis not present

## 2022-04-14 ENCOUNTER — Ambulatory Visit (INDEPENDENT_AMBULATORY_CARE_PROVIDER_SITE_OTHER): Payer: Medicare HMO

## 2022-04-14 DIAGNOSIS — J309 Allergic rhinitis, unspecified: Secondary | ICD-10-CM | POA: Diagnosis not present

## 2022-04-22 ENCOUNTER — Ambulatory Visit (INDEPENDENT_AMBULATORY_CARE_PROVIDER_SITE_OTHER): Payer: Medicare HMO

## 2022-04-22 DIAGNOSIS — J309 Allergic rhinitis, unspecified: Secondary | ICD-10-CM | POA: Diagnosis not present

## 2022-04-25 DIAGNOSIS — H4043X4 Glaucoma secondary to eye inflammation, bilateral, indeterminate stage: Secondary | ICD-10-CM | POA: Diagnosis not present

## 2022-04-25 DIAGNOSIS — H209 Unspecified iridocyclitis: Secondary | ICD-10-CM | POA: Diagnosis not present

## 2022-04-27 DIAGNOSIS — L2089 Other atopic dermatitis: Secondary | ICD-10-CM | POA: Diagnosis not present

## 2022-05-05 ENCOUNTER — Ambulatory Visit (INDEPENDENT_AMBULATORY_CARE_PROVIDER_SITE_OTHER): Payer: Medicare HMO

## 2022-05-05 DIAGNOSIS — J309 Allergic rhinitis, unspecified: Secondary | ICD-10-CM

## 2022-05-10 DIAGNOSIS — H30033 Focal chorioretinal inflammation, peripheral, bilateral: Secondary | ICD-10-CM | POA: Diagnosis not present

## 2022-05-10 DIAGNOSIS — H4043X1 Glaucoma secondary to eye inflammation, bilateral, mild stage: Secondary | ICD-10-CM | POA: Diagnosis not present

## 2022-05-10 DIAGNOSIS — H4043X4 Glaucoma secondary to eye inflammation, bilateral, indeterminate stage: Secondary | ICD-10-CM | POA: Diagnosis not present

## 2022-05-10 DIAGNOSIS — Z79899 Other long term (current) drug therapy: Secondary | ICD-10-CM | POA: Diagnosis not present

## 2022-05-10 DIAGNOSIS — H3581 Retinal edema: Secondary | ICD-10-CM | POA: Diagnosis not present

## 2022-05-10 DIAGNOSIS — H40043 Steroid responder, bilateral: Secondary | ICD-10-CM | POA: Diagnosis not present

## 2022-05-10 DIAGNOSIS — Z9841 Cataract extraction status, right eye: Secondary | ICD-10-CM | POA: Diagnosis not present

## 2022-05-10 DIAGNOSIS — H44113 Panuveitis, bilateral: Secondary | ICD-10-CM | POA: Diagnosis not present

## 2022-05-10 DIAGNOSIS — Z961 Presence of intraocular lens: Secondary | ICD-10-CM | POA: Diagnosis not present

## 2022-05-23 ENCOUNTER — Ambulatory Visit (INDEPENDENT_AMBULATORY_CARE_PROVIDER_SITE_OTHER): Payer: Medicare HMO | Admitting: *Deleted

## 2022-05-23 DIAGNOSIS — J309 Allergic rhinitis, unspecified: Secondary | ICD-10-CM

## 2022-05-24 ENCOUNTER — Other Ambulatory Visit: Payer: Self-pay | Admitting: Allergy

## 2022-05-30 ENCOUNTER — Encounter: Payer: Self-pay | Admitting: Family

## 2022-05-30 ENCOUNTER — Ambulatory Visit (INDEPENDENT_AMBULATORY_CARE_PROVIDER_SITE_OTHER): Payer: Medicare HMO | Admitting: Family

## 2022-05-30 ENCOUNTER — Other Ambulatory Visit: Payer: Self-pay

## 2022-05-30 VITALS — BP 118/72 | HR 78 | Temp 97.6°F | Resp 18 | Ht 69.0 in | Wt >= 6400 oz

## 2022-05-30 DIAGNOSIS — J4540 Moderate persistent asthma, uncomplicated: Secondary | ICD-10-CM

## 2022-05-30 DIAGNOSIS — T7800XD Anaphylactic reaction due to unspecified food, subsequent encounter: Secondary | ICD-10-CM | POA: Diagnosis not present

## 2022-05-30 DIAGNOSIS — J3089 Other allergic rhinitis: Secondary | ICD-10-CM | POA: Diagnosis not present

## 2022-05-30 DIAGNOSIS — L2089 Other atopic dermatitis: Secondary | ICD-10-CM

## 2022-05-30 DIAGNOSIS — J302 Other seasonal allergic rhinitis: Secondary | ICD-10-CM

## 2022-05-30 MED ORDER — MONTELUKAST SODIUM 10 MG PO TABS: 10.0000 mg | ORAL_TABLET | Freq: Every day | ORAL | 5 refills | Status: DC

## 2022-05-30 MED ORDER — BUDESONIDE-FORMOTEROL FUMARATE 160-4.5 MCG/ACT IN AERO: 2.0000 | INHALATION_SPRAY | Freq: Two times a day (BID) | RESPIRATORY_TRACT | 5 refills | Status: DC

## 2022-05-30 MED ORDER — FLOVENT HFA 110 MCG/ACT IN AERO: INHALATION_SPRAY | RESPIRATORY_TRACT | 2 refills | Status: DC

## 2022-05-30 MED ORDER — ALBUTEROL SULFATE HFA 108 (90 BASE) MCG/ACT IN AERS: INHALATION_SPRAY | RESPIRATORY_TRACT | 1 refills | Status: DC

## 2022-05-30 MED ORDER — CETIRIZINE HCL 10 MG PO TABS: 10.0000 mg | ORAL_TABLET | Freq: Every day | ORAL | 1 refills | Status: DC

## 2022-05-30 MED ORDER — EPINEPHRINE 0.3 MG/0.3ML IJ SOAJ: 0.3000 mg | INTRAMUSCULAR | 1 refills | Status: DC | PRN

## 2022-05-30 MED ORDER — AZELASTINE HCL 0.1 % NA SOLN: NASAL | 5 refills | Status: DC

## 2022-05-30 NOTE — Patient Instructions (Addendum)
1. Moderate persistent asthma   - Daily controller medication(s):  Restart Symbicort 160/4.5 two puffs twice daily with spacer. Prescription sent - Rescue medications: Albuterol 2  puffs every 4-6 hours as needed for cough, wheeze, tightness in chest, or shortness of breath - Changes during respiratory infections or as flare: add Flovent 11mg to 2 puffs twice daily for ONE TO TWO WEEKS - Asthma control goals:  * Full participation in all desired activities (may need albuterol before activity) * Albuterol use two time or less a week on average (not counting use with activity) * Cough interfering with sleep two time or less a month * Oral steroids no more than once a year * No hospitalizations  2.  Allergic rhinitis  - Continue with Singulair, cetirizine, and Astelin.   - Continue with allergy shots at the same schedule and have access to your epinephrine auto injector device.   3. Atopic dermatitis - Continue with moisturizing  daily. - Continue with [employer]   - Continue with your medicated ointments as needed.   4. Multiple food allergies (peanuts, shellfish, multiple fruits/vegetables) - EpiPen is up to date.Refill sent  - Continue to avoid peanuts, tree nuts, and shellfish.  - Schedule an appointment for skin testing to select foods. You will need to be of Astelin nasal spray and cetirizine for 3 days prior to this appointment   5. Schedule an appointment to skin test to select foods on a Monday or Wednesday

## 2022-05-30 NOTE — Progress Notes (Signed)
[place] [address] Dept: [phone]  FOLLOW UP NOTE  Patient ID: Tiffany Velasquez, female    DOB: [birth year]  Age: 27 y.o. MRN: [id number] Date of Office Visit: 05/30/2022  Assessment  Chief Complaint: Allergic Rhinitis , Follow-up, and Nasal Congestion  HPI Tiffany Velasquez is a 27 year old female who presents today for follow-up of moderate persistent asthma, chronic allergic rhinitis, atopic dermatitis, and multiple food allergies.  She was last seen on February 02, 2021 by Dr. Ernst Bowler.  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  She continues to follow-up with a retinal specialist and takes glaucoma drops.  She did start IVF infusions in January and after 2 weeks they did notice a decrease in inflammation.  She is possibly getting a port placed soon due to her being a hard stick.  Moderate persistent asthma: She has currently been out of Symbicort 160/4.5 mcg for the past 2 and half weeks.  She has also been out of albuterol for the past week.  While she was out of Symbicort she was using her albuterol inhaler approximately 1-2 times a day.  When she used her albuterol it did help.  She has not needed to add on Flovent 110 mcg 2 puffs twice a day for 1 to 2 weeks for respiratory infections/asthma flare since her last office visit.  She reports a cough occurring maybe twice a week that occurs in the morning and could be due to postnasal drip.  What  she is able to cough up is clear with a little bit of yellow at times.  She also has wheezing at times, but it is not daily.  She also has tightness sometimes, but it does not occur daily.  She denies shortness of breath and nocturnal awakenings due to breathing problems.  Since her last office visit she has  not required any trips to the emergency room or urgent care due to breathing problems.  She has not required any systemic steroids since we last saw her due to breathing problems.She does not smoke, but is around her parents that do smoke.  Allergic rhinitis: She is currently taking Singulair 10 mg once a day, cetirizine 10 mg once a day, and Astelin nasal spray approximately 3 times a week.  She also continues to receive allergy injections per protocol.  She denies any problems or reactions with her allergy injections.  She does feel like they help.  She does report nasal congestion.  Her Astelin is slowly working.  She also reports clear rhinorrhea and a little bit of postnasal drip first thing in the morning.  She has not had any sinus infections since we last saw her.  Atopic dermatitis: She last saw Dr. Fontaine No approximately 1 and half months ago and she was having a rash then.  The rash is better now.  The rash was due to using a different detergent.  She does continue to have eczema at times in her thighs/upper leg area.  She has not had any skin infections since we last saw her.  She uses Vaseline or Aveeno for moisturization. She is no longer doing steroid injections for alopecia.  She reports that she stopped that years ago.  Multiple food allergies: She continues to avoid peanuts, tree nuts,  and shellfish without any accidental ingestion or use of her epinephrine autoinjector device.  She is interested in updating her food allergies.  She would like to do skin testing due to being a hard stick.     Drug Allergies:  Allergies  Allergen Reactions   Peanut-Containing Drug Products Swelling    Throat swelling   Shellfish Allergy Swelling    Throat swelling    Apple Juice    Orange Fruit [Citrus]    Peach Flavor    Tomato    Tree Extract     ALLERGIC TO TREE NUTS    Review of Systems: Review of Systems  Constitutional:  Negative for chills and fever.  HENT:         Reports  nasal congestion, clear rhinorrhea and a little bit of postnasal drip  Eyes:        Denies itchy watery eyes  Respiratory:  Positive for cough and wheezing. Negative for shortness of breath.        Reports little bit of coughing occasionally first thing in the morning. what she coughs up is mainly clear with a little bit of yellow.  She also has wheezing occasionally.  She also has tightness in her chest sometimes.  Denies shortness of breath and nocturnal awakenings due to breathing problems.  Cardiovascular:  Negative for chest pain and palpitations.  Gastrointestinal:        Denies heartburn or reflux symptoms  Genitourinary:  Negative for frequency.  Skin:        She reports that her rash is gone now  Neurological:  Negative for headaches.  Endo/Heme/Allergies:  Positive for environmental allergies.     Physical Exam: BP 118/72   Pulse 78   Temp 97.6 F (36.4 C) (Temporal)   Resp 18   Ht '5\' 9"'$  (1.753 m)   Wt (!) 475 lb 8 oz (215.7 kg)   SpO2 98%   BMI 70.22 kg/m    Physical Exam Constitutional:      Appearance: Normal appearance.  HENT:     Head: Normocephalic and atraumatic.     Comments: Pharynx normal, eyes normal, ears normal, nose: Bilateral lower turbinates mildly edematous with no drainage noted    Right Ear: Tympanic membrane, ear canal and external ear normal.     Left Ear: Tympanic membrane, ear canal and external ear normal.     Mouth/Throat:     Mouth: Mucous membranes are moist.     Pharynx: Oropharynx is clear.  Eyes:     Conjunctiva/sclera: Conjunctivae normal.  Cardiovascular:     Rate and Rhythm: Normal rate and regular rhythm.     Heart sounds: Normal heart sounds.  Pulmonary:     Effort: Pulmonary effort is normal.     Breath sounds: Normal breath sounds.  Musculoskeletal:     Cervical back: Neck supple.  Skin:    General: Skin is warm.     Comments: Hyperpigmentation noted on ventral aspect of left forearm  Neurological:     Mental Status:  She is alert and oriented to person, place, and time.  Psychiatric:        Mood and Affect: Mood normal.        Behavior: Behavior normal.        Thought Content: Thought content normal.        Judgment: Judgment normal.     Diagnostics: FVC 3.97 L (102%), FEV1 3.00 L (90%).  Spirometry indicates normal spirometry.  Assessment and Plan: 1.  Moderate persistent asthma without complication   2. Seasonal and perennial allergic rhinitis   3. Anaphylactic shock due to food, subsequent encounter   4. Other atopic dermatitis     Meds ordered this encounter  Medications   azelastine (ASTELIN) 0.1 % nasal spray    Sig: USE 2 SPRAYS IN EACH NOSTRIL TWICE DAILY AS NEEDED FOR RUNNY NOSE/DRAINAGE DOWN THROAT    Dispense:  30 mL    Refill:  5   budesonide-formoterol (SYMBICORT) 160-4.5 MCG/ACT inhaler    Sig: Inhale 2 puffs into the lungs 2 (two) times daily.    Dispense:  1 each    Refill:  5   cetirizine (ZYRTEC) 10 MG tablet    Sig: Take 1 tablet (10 mg total) by mouth daily.    Dispense:  90 tablet    Refill:  1   FLOVENT HFA 110 MCG/ACT inhaler    Sig: During asthma flares/upper respiratory infections take 2 puffs twice a day for one to two weeks    Dispense:  12 g    Refill:  2   albuterol (VENTOLIN HFA) 108 (90 Base) MCG/ACT inhaler    Sig: Inhale 2 puffs every 4-6 hours as needed for cough, wheeze, tightness in chest, or shortness of breath    Dispense:  8 g    Refill:  1   EPINEPHrine 0.3 mg/0.3 mL IJ SOAJ injection    Sig: Inject 0.3 mg into the muscle as needed for anaphylaxis.    Dispense:  2 each    Refill:  1   montelukast (SINGULAIR) 10 MG tablet    Sig: Take 1 tablet (10 mg total) by mouth at bedtime.    Dispense:  30 tablet    Refill:  5    Patient Instructions  1. Moderate persistent asthma   - Daily controller medication(s):  Restart Symbicort 160/4.5 two puffs twice daily with spacer. Prescription sent - Rescue medications: Albuterol 2  puffs every 4-6 hours  as needed for cough, wheeze, tightness in chest, or shortness of breath - Changes during respiratory infections or as flare: add Flovent 142mg to 2 puffs twice daily for ONE TO TWO WEEKS - Asthma control goals:  * Full participation in all desired activities (may need albuterol before activity) * Albuterol use two time or less a week on average (not counting use with activity) * Cough interfering with sleep two time or less a month * Oral steroids no more than once a year * No hospitalizations  2.  Allergic rhinitis  - Continue with Singulair, cetirizine, and Astelin.   - Continue with allergy shots at the same schedule and have access to your epinephrine auto injector device.   3. Atopic dermatitis - Continue with moisturizing  daily. - Continue with [employer]   - Continue with your medicated ointments as needed.   4. Multiple food allergies (peanuts, shellfish, multiple fruits/vegetables) - EpiPen is up to date.Refill sent  - Continue to avoid peanuts, tree nuts, and shellfish.  - Schedule an appointment for skin testing to select foods. You will need to be of Astelin nasal spray and cetirizine for 3 days prior to this appointment   5. Schedule an appointment to skin test to select foods on a Monday or Wednesday  Return in about 2 weeks (around 06/13/2022), or if symptoms worsen or fail to improve, for skin testing select foods.    Thank you for the opportunity to care for this patient.  Please do not  hesitate to contact me with questions.  Althea Charon, FNP [facility]

## 2022-06-06 ENCOUNTER — Telehealth: Payer: Self-pay

## 2022-06-06 NOTE — Telephone Encounter (Signed)
Patient called in -DOB/Pharmacy verified - stated she started having the following the symptoms yesterday, Sunday, 06/05/22: runny nose and cough - yellowish, brownish; drainage, chest congestion. Patient denies fever.  Patient is requesting Prednisone be sent in to [facility]/ [address].  Patient advised message would be forwarded to provider for next step.  Patient verbalized understanding, no further questions.

## 2022-06-06 NOTE — Telephone Encounter (Signed)
Has she added on Flovent 131mg to 2 puffs twice daily for ONE TO TWO WEEKS in addition to taking her Symbicort 160/4.5 mcg 2 puffs twice  a day?

## 2022-06-07 DIAGNOSIS — H3581 Retinal edema: Secondary | ICD-10-CM | POA: Diagnosis not present

## 2022-06-07 DIAGNOSIS — Z961 Presence of intraocular lens: Secondary | ICD-10-CM | POA: Diagnosis not present

## 2022-06-07 DIAGNOSIS — H44113 Panuveitis, bilateral: Secondary | ICD-10-CM | POA: Diagnosis not present

## 2022-06-07 DIAGNOSIS — H4043X4 Glaucoma secondary to eye inflammation, bilateral, indeterminate stage: Secondary | ICD-10-CM | POA: Diagnosis not present

## 2022-06-07 DIAGNOSIS — Z79899 Other long term (current) drug therapy: Secondary | ICD-10-CM | POA: Diagnosis not present

## 2022-06-07 DIAGNOSIS — H40043 Steroid responder, bilateral: Secondary | ICD-10-CM | POA: Diagnosis not present

## 2022-06-07 DIAGNOSIS — H30033 Focal chorioretinal inflammation, peripheral, bilateral: Secondary | ICD-10-CM | POA: Diagnosis not present

## 2022-06-07 DIAGNOSIS — H4043X1 Glaucoma secondary to eye inflammation, bilateral, mild stage: Secondary | ICD-10-CM | POA: Diagnosis not present

## 2022-06-07 DIAGNOSIS — H209 Unspecified iridocyclitis: Secondary | ICD-10-CM | POA: Diagnosis not present

## 2022-06-07 NOTE — Telephone Encounter (Signed)
I did call the patient today and advise did advise of using the Flovent as an add on the with the Symbicort when she is having upper respiratory symptoms or flares. Patient verbalized understanding, she stated that she will step back down to the Symbicort only, she states that she has not had to use her Albuterol recently.

## 2022-06-07 NOTE — Telephone Encounter (Signed)
Called and advised patient, patient verbalized understanding and will call back Thursday with an update.

## 2022-06-07 NOTE — Telephone Encounter (Signed)
I called and spoke with the patient and she stated that she did add on the Flovent along with the Symbicort this past Friday and has been doing it every day as instructed.

## 2022-06-07 NOTE — Telephone Encounter (Signed)
Thanks. We will hold off on steroids for now. Please call on Thursday to see how she is doing.

## 2022-06-07 NOTE — Telephone Encounter (Signed)
Did she start the Gray on Friday ahead of when her symptoms started on Sunday? For clarification make sure that she knows that she only adds on Flovent to her Symbicort regimen only when she has asthma flares or upper respiratory infection. She is to do the Flovent 110 mcg 2 puffs twice a day for 1-2 weeks and then set it to the side. How often is she using her albuterol inhaler?

## 2022-06-07 NOTE — Telephone Encounter (Signed)
Thanks. Yes, please ask those questions

## 2022-06-09 ENCOUNTER — Ambulatory Visit (INDEPENDENT_AMBULATORY_CARE_PROVIDER_SITE_OTHER): Payer: Medicare HMO

## 2022-06-09 DIAGNOSIS — J309 Allergic rhinitis, unspecified: Secondary | ICD-10-CM | POA: Diagnosis not present

## 2022-06-13 ENCOUNTER — Ambulatory Visit: Payer: Medicare HMO | Admitting: Family

## 2022-06-17 DIAGNOSIS — H44113 Panuveitis, bilateral: Secondary | ICD-10-CM | POA: Diagnosis not present

## 2022-06-17 DIAGNOSIS — H30033 Focal chorioretinal inflammation, peripheral, bilateral: Secondary | ICD-10-CM | POA: Diagnosis not present

## 2022-06-21 ENCOUNTER — Ambulatory Visit (INDEPENDENT_AMBULATORY_CARE_PROVIDER_SITE_OTHER): Payer: Medicare HMO | Admitting: *Deleted

## 2022-06-21 DIAGNOSIS — J309 Allergic rhinitis, unspecified: Secondary | ICD-10-CM | POA: Diagnosis not present

## 2022-06-28 NOTE — Progress Notes (Signed)
VIALS EXP 06-28-23

## 2022-06-29 DIAGNOSIS — J301 Allergic rhinitis due to pollen: Secondary | ICD-10-CM | POA: Diagnosis not present

## 2022-06-30 DIAGNOSIS — J3089 Other allergic rhinitis: Secondary | ICD-10-CM | POA: Diagnosis not present

## 2022-07-07 ENCOUNTER — Ambulatory Visit (INDEPENDENT_AMBULATORY_CARE_PROVIDER_SITE_OTHER): Payer: Medicare HMO

## 2022-07-07 DIAGNOSIS — J309 Allergic rhinitis, unspecified: Secondary | ICD-10-CM

## 2022-07-13 DIAGNOSIS — H30033 Focal chorioretinal inflammation, peripheral, bilateral: Secondary | ICD-10-CM | POA: Diagnosis not present

## 2022-07-18 ENCOUNTER — Ambulatory Visit (INDEPENDENT_AMBULATORY_CARE_PROVIDER_SITE_OTHER): Payer: Medicare HMO

## 2022-07-18 DIAGNOSIS — J309 Allergic rhinitis, unspecified: Secondary | ICD-10-CM | POA: Diagnosis not present

## 2022-07-25 DIAGNOSIS — H209 Unspecified iridocyclitis: Secondary | ICD-10-CM | POA: Diagnosis not present

## 2022-07-25 DIAGNOSIS — H4043X4 Glaucoma secondary to eye inflammation, bilateral, indeterminate stage: Secondary | ICD-10-CM | POA: Diagnosis not present

## 2022-08-05 ENCOUNTER — Ambulatory Visit (INDEPENDENT_AMBULATORY_CARE_PROVIDER_SITE_OTHER): Payer: Medicare HMO | Admitting: *Deleted

## 2022-08-05 DIAGNOSIS — J309 Allergic rhinitis, unspecified: Secondary | ICD-10-CM

## 2022-08-11 ENCOUNTER — Ambulatory Visit (INDEPENDENT_AMBULATORY_CARE_PROVIDER_SITE_OTHER): Payer: Medicare HMO

## 2022-08-11 DIAGNOSIS — J309 Allergic rhinitis, unspecified: Secondary | ICD-10-CM

## 2022-08-14 ENCOUNTER — Ambulatory Visit (INDEPENDENT_AMBULATORY_CARE_PROVIDER_SITE_OTHER): Payer: Medicare HMO

## 2022-08-14 ENCOUNTER — Encounter (HOSPITAL_COMMUNITY): Payer: Self-pay

## 2022-08-14 ENCOUNTER — Ambulatory Visit (HOSPITAL_COMMUNITY)
Admission: EM | Admit: 2022-08-14 | Discharge: 2022-08-14 | Disposition: A | Discharge status: Home / Self Care | Payer: Medicare HMO | Attending: Emergency Medicine | Admitting: Emergency Medicine

## 2022-08-14 DIAGNOSIS — J309 Allergic rhinitis, unspecified: Secondary | ICD-10-CM

## 2022-08-14 DIAGNOSIS — J029 Acute pharyngitis, unspecified: Secondary | ICD-10-CM

## 2022-08-14 DIAGNOSIS — J019 Acute sinusitis, unspecified: Secondary | ICD-10-CM

## 2022-08-14 DIAGNOSIS — J4540 Moderate persistent asthma, uncomplicated: Secondary | ICD-10-CM

## 2022-08-14 DIAGNOSIS — R042 Hemoptysis: Secondary | ICD-10-CM

## 2022-08-14 LAB — POCT RAPID STREP A (OFFICE): Rapid Strep A Screen: NEGATIVE

## 2022-08-14 MED ORDER — AZITHROMYCIN 250 MG PO TABS: ORAL_TABLET | ORAL | 0 refills | Status: AC

## 2022-08-14 NOTE — ED Triage Notes (Signed)
Patient here today with c/o productive cough and ST X 1 week. She states that the phlegm is mixed with blood. ST is worse in the morning. She has tried Mucinex ST drop with no relief. Tylenol helps a little. Hot tea helps. She recently went to [facility]. Has a h/o allergies and get allergy shots. No sick contacts. No fever.

## 2022-08-14 NOTE — ED Provider Notes (Signed)
[facility]    CSN: [id number] Arrival date & time: 08/14/22  1002    HISTORY   Chief Complaint  Patient presents with   Sore Throat   HPI Tiffany Velasquez is a pleasant, 27 y.o. female who presents to urgent care today. Patient here today with c/o sore throat X 1 week. She states that when she clears her throat, the phlegm she produces is mixed with blood.  States that her sore throat pain is worse in the morning, is worse when swallowing.  States she has tried Mucinex sore throat drop with no relief but Tylenol helps a little as does hot tea.  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  Patient reports a h/o allergies and is currently undergoing allergy immunotherapy, also has a history of glaucoma currently undergoing infusion therapy, has access port in place.  Denies known sick contacts, fever, body aches, chills, nausea, vomiting, diarrhea.  Patient states she has not really been coughing.  Patient states she is unaware that she has lost 45 pounds in the past 2 months.  The history is provided by the patient.   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   Retinal edema 07/13/2018   Adverse food reaction 06/15/2017   Moderate persistent asthma without complication 01/16/2017   Low back pain 12/22/2015   Food allergy 12/06/2014   Healthcare maintenance 04/12/2014   Asthma, chronic 06/22/2012   Family history of sudden cardiac death 2011-08-06   Morbid obesity (HCC) 10/05/2009    CONSTIPATION, CHRONIC 04/16/2008   ATTENTION DEFICIT, W/HYPERACTIVITY 05/25/2006   ALLERGIC CONJUNCTIVITIS 05/25/2006   Allergic rhinitis 05/25/2006   Severe eczema 05/25/2006   Past Surgical History:  Procedure Laterality Date   CATARACT EXTRACTION     no past surgery     OB History   No obstetric history on file.    Home Medications    Prior to Admission medications   Medication Sig Start Date End Date Taking? Authorizing Provider  Adalimumab 40 MG/0.4ML PNKT Inject into the skin. 03/03/20   [provider]  albuterol (VENTOLIN HFA) 108 (90 Base) MCG/ACT inhaler Inhale 2 puffs every 4-6 hours as needed for cough, wheeze, tightness in chest, or shortness of breath 05/30/22   Nehemiah Settle, FNP  atropine 1 % ophthalmic solution Place 1 drop into the left eye once. 02/28/22 02/28/23  [provider]  azelastine (ASTELIN) 0.1 % nasal spray USE 2 SPRAYS IN EACH NOSTRIL TWICE DAILY AS NEEDED FOR RUNNY NOSE/DRAINAGE DOWN THROAT 05/30/22   Nehemiah Settle, FNP  AZELEX 20 % cream Apply topically daily. 06/28/21   [provider]  brimonidine (ALPHAGAN) 0.15 % ophthalmic solution 1 drop 3 (three) times daily. 02/01/20   [provider]  budesonide-formoterol (SYMBICORT) 160-4.5 MCG/ACT inhaler Inhale 2 puffs into the lungs 2 (two) times daily. 05/30/22   Nehemiah Settle, FNP  cetirizine (ZYRTEC) 10 MG tablet Take 1 tablet (10 mg total) by mouth daily. 05/30/22  Nehemiah Settle, FNP  clindamycin (CLEOCIN T) 1 % lotion Apply topically 2 (two) times daily. 08/17/15   [provider]  diphenhydrAMINE (BENADRYL) 50 MG/ML injection Inject 50 mg into the muscle. 04/05/22   [provider]  dorzolamide-timolol (COSOPT) 2-0.5 % ophthalmic solution 1 drop 2 (two) times daily.    [provider]  EPINEPHrine 0.3 mg/0.3 mL IJ SOAJ injection Inject 0.3 mg into the muscle as needed for anaphylaxis. 05/30/22   Nehemiah Settle, FNP  FLOVENT HFA 110 MCG/ACT  inhaler During asthma flares/upper respiratory infections take 2 puffs twice a day for one to two weeks 05/30/22   Nehemiah Settle, FNP  fluticasone [facility]) 50 MCG/ACT nasal spray Place 1 spray into both nostrils daily. 08/19/21   Shirlean Mylar, MD  folic acid (FOLVITE) 1 MG tablet Take 1 tablet by mouth daily.    [provider]  GAMUNEX-C 20 GM/200ML SOLN  05/20/22   [provider]  hydrOXYzine (ATARAX/VISTARIL) 10 MG tablet Take 1 tablet (10 mg total) by mouth daily as needed. 01/16/17   Alfonse Spruce, MD  meloxicam (MOBIC) 15 MG tablet Take 1 tablet (15 mg total) by mouth daily. 07/29/21   McDonald, Adam R, DPM  mometasone (ELOCON) 0.1 % cream Apply 1 application. topically 2 (two) times daily. 06/28/21   [provider]  montelukast (SINGULAIR) 10 MG tablet Take 1 tablet (10 mg total) by mouth at bedtime. 05/30/22   Nehemiah Settle, FNP  mycophenolate (CELLCEPT) 500 MG tablet Take by mouth. 06/22/21   [provider]  prednisoLONE acetate (PRED FORTE) 1 % ophthalmic suspension Apply to eye. 02/28/22   [provider]  RETIN-A MICRO 0.04 % gel Apply 1 application. topically daily. 06/28/21   [provider]  sirolimus (RAPAMUNE) 2 MG tablet Take by mouth. 05/13/21   [provider]  sucralfate (CARAFATE) 1 GM/10ML suspension Take 10 mLs (1 g total) by mouth 4 (four) times daily -  with meals and at bedtime. 05/09/19   Caccavale, Sophia, PA-C    Family History Family History  Problem Relation Age of Onset   Sudden death Cousin    Asthma Mother    Allergic rhinitis Father    Eczema Neg Hx    Urticaria Neg Hx    Social History Social History   Tobacco Use   Smoking status: Never   Smokeless tobacco: Never  Vaping Use   Vaping Use: Never used  Substance Use Topics   Alcohol use: No   Drug use: No   Allergies   Peanut-containing drug products, Shellfish allergy, Apple juice, Orange fruit [citrus], Peach flavor, Tomato,  and Tree extract  Review of Systems Review of Systems Pertinent findings revealed after performing a 14 point review of systems has been noted in the history of present illness.  Physical Exam Vital Signs BP (!) 135/92 (BP Location: Left Arm)   Pulse 76   Temp 98.4 F (36.9 C) (Oral)   Resp 16   Ht 5\' 9"  (1.753 m)   Wt (!) 420 lb (190.5 kg)   LMP 07/18/2022 (Approximate)   SpO2 98%   BMI 62.02 kg/m   No data found.  Physical Exam Vitals and nursing note reviewed.  Constitutional:      General: She is awake. She is not in acute distress.    Appearance: Normal appearance. She is well-developed and well-groomed. She is morbidly obese. She is not ill-appearing.  HENT:     Head: Normocephalic and atraumatic.  Salivary Glands: Right salivary gland is not diffusely enlarged or tender. Left salivary gland is not diffusely enlarged or tender.     Right Ear: Hearing, ear canal and external ear normal. No drainage. A middle ear effusion is present. There is no impacted cerumen. Tympanic membrane is bulging. Tympanic membrane is not injected or erythematous.     Left Ear: Hearing, ear canal and external ear normal. No drainage. A middle ear effusion is present. There is no impacted cerumen. Tympanic membrane is bulging. Tympanic membrane is not injected or erythematous.     Ears:     Comments: Bilateral EACs normal, both TMs bulging with clear fluid    Nose: Rhinorrhea present. No nasal deformity, septal deviation, signs of injury, nasal tenderness, mucosal edema or congestion. Rhinorrhea is clear.     Right Nostril: Occlusion present. No foreign body, epistaxis or septal hematoma.     Left Nostril: Occlusion present. No foreign body, epistaxis or septal hematoma.     Right Turbinates: Enlarged and swollen. Not pale.     Left Turbinates: Enlarged and swollen. Not pale.     Right Sinus: No maxillary sinus tenderness or frontal sinus tenderness.     Left Sinus: No maxillary sinus  tenderness or frontal sinus tenderness.     Comments: Bilateral turbinates diffusely erythematous    Mouth/Throat:     Lips: Pink. No lesions.     Mouth: Mucous membranes are moist. No oral lesions.     Pharynx: Uvula midline. Pharyngeal swelling and posterior oropharyngeal erythema present. No oropharyngeal exudate or uvula swelling.     Tonsils: No tonsillar exudate. 2+ on the right. 2+ on the left.     Comments: Postnasal drip Eyes:     General: Lids are normal.        Right eye: No discharge.        Left eye: No discharge.     Extraocular Movements: Extraocular movements intact.     Conjunctiva/sclera:     Right eye: Right conjunctiva is injected. No chemosis, exudate or hemorrhage.    Left eye: Left conjunctiva is injected. No chemosis, exudate or hemorrhage.    Pupils: Pupils are equal, round, and reactive to light.  Neck:     Trachea: Trachea and phonation normal.  Cardiovascular:     Rate and Rhythm: Normal rate and regular rhythm.     Pulses: Normal pulses.     Heart sounds: Normal heart sounds, S1 normal and S2 normal. No murmur heard.    No friction rub. No gallop.  Pulmonary:     Effort: Pulmonary effort is normal. No tachypnea, bradypnea, accessory muscle usage, prolonged expiration or respiratory distress.     Breath sounds: No stridor, decreased air movement or transmitted upper airway sounds. Examination of the right-middle field reveals decreased breath sounds. Examination of the left-middle field reveals decreased breath sounds. Examination of the right-lower field reveals decreased breath sounds. Examination of the left-lower field reveals decreased breath sounds. Decreased breath sounds present. No wheezing, rhonchi or rales.  Chest:     Chest wall: No tenderness.  Musculoskeletal:        General: Normal range of motion.     Cervical back: Full passive range of motion without pain, normal range of motion and neck supple. No edema, erythema or rigidity. No pain with  movement. Normal range of motion.  Lymphadenopathy:     Cervical: No cervical adenopathy.  Skin:    General: Skin is warm and dry.  Findings: No erythema or rash.  Neurological:     General: No focal deficit present.     Mental Status: She is alert and oriented to person, place, and time.  Psychiatric:        Mood and Affect: Mood normal.        Behavior: Behavior normal. Behavior is cooperative.     Visual Acuity Right Eye Distance:   Left Eye Distance:   Bilateral Distance:    Right Eye Near:   Left Eye Near:    Bilateral Near:     [facility] Couse / Diagnostics / Procedures:     Radiology DG Chest 2 View  Result Date: 08/14/2022 CLINICAL DATA:  27 year old female with history of decreased breath sounds and hemoptysis. EXAM: CHEST - 2 VIEW COMPARISON:  Chest x-ray 08/19/2011. FINDINGS: Right internal jugular double-lumen power porta cath with tip terminating in the right atrium. Lung volumes are normal. No consolidative airspace disease. No pleural effusions. Azygous lobe (normal anatomical variant) incidentally noted. No pneumothorax. No pulmonary nodule or mass noted. Pulmonary vasculature and the cardiomediastinal silhouette are within normal limits. IMPRESSION: No radiographic evidence of acute cardiopulmonary disease. Electronically Signed   By: Trudie Reed M.D.   On: 08/14/2022 11:04    Procedures Procedures (including critical care time) EKG  Pending results:  Labs Reviewed  CULTURE, GROUP A STREP [facility])  POCT RAPID STREP A (OFFICE)    Medications Ordered in UC: Medications - No data to display  UC Diagnoses / Final Clinical Impressions(s)   I have reviewed the triage vital signs and the nursing notes.  Pertinent labs & imaging results that were available during my care of the patient were reviewed by me and considered in my medical decision making (see chart for details).    Final diagnoses:  Acute pharyngitis, unspecified etiology  Blood-tinged sputum   Allergic rhinitis, unspecified seasonality, unspecified trigger  Moderate persistent asthma without complication  Acute rhinosinusitis   Patient advised of x-ray findings.  Given blood-tinged sputum and duration of symptoms recommend patient complete a 5-day course of azithromycin for prevention of bacterial superinfection of what is likely related to viral respiratory infection complicated by asthma and allergies. Please see discharge instructions below for further details of plan of care as provided to patient. ED Prescriptions     Medication Sig Dispense Auth. Provider   azithromycin (ZITHROMAX) 250 MG tablet Take 2 tablets (500 mg total) by mouth daily for 1 day, THEN 1 tablet (250 mg total) daily for 4 days. 6 tablet Theadora Rama Scales, PA-C      PDMP not reviewed this encounter.  Disposition Upon Discharge:  Condition: stable for discharge home Home: take medications as prescribed; routine discharge instructions as discussed; follow up as advised.  Patient presented with an acute illness with associated systemic symptoms and significant discomfort requiring urgent management. In my opinion, this is a condition that a prudent lay person (someone who possesses an average knowledge of health and medicine) may potentially expect to result in complications if not addressed urgently such as respiratory distress, impairment of bodily function or dysfunction of bodily organs.   Routine symptom specific, illness specific and/or disease specific instructions were discussed with the patient and/or caregiver at length.   As such, the patient has been evaluated and assessed, work-up was performed and treatment was provided in alignment with urgent care protocols and evidence based medicine.  Patient/parent/caregiver has been advised that the patient may require follow up for further testing and treatment if  the symptoms continue in spite of treatment, as clinically indicated and  appropriate.  Patient/parent/caregiver has been advised to return to the [facility] or PCP in 3-5 days if no better; to PCP or the Emergency Department if new signs and symptoms develop, or if the current signs or symptoms continue to change or worsen for further workup, evaluation and treatment as clinically indicated and appropriate  The patient will follow up with their current PCP if and as advised. If the patient does not currently have a PCP we will assist them in obtaining one.   The patient may need specialty follow up if the symptoms continue, in spite of conservative treatment and management, for further workup, evaluation, consultation and treatment as clinically indicated and appropriate.  Patient/parent/caregiver verbalized understanding and agreement of plan as discussed.  All questions were addressed during visit.  Please see discharge instructions below for further details of plan.  Discharge Instructions:   Discharge Instructions      Your strep test today is negative.  Streptococcal throat culture will be performed per our protocol, please keep in mind that the rapid strep test that we perform here at urgent care only catches 40% of strep throat infections.     Based on my physical exam findings and the history you provided to me today, I recommend that you begin antibiotics now for presumed bacterial infection in your sinuses which may be causing pain in your throat along with bloody sputum.  If your strep culture result is positive, the antibiotics that I have prescribed for you will treat this as well..  I have sent a prescription to your pharmacy.  After 24 hours of antibiotics, you should begin to feel significantly better.      Please read below to learn more about the medications, dosages and frequencies that I recommend to help alleviate your symptoms and to get you feeling better soon:   Z-Pak (azithromycin):  Please take two (2) tablets on day one and one tablet daily  thereafter until the prescription is complete.This antibiotic can cause upset stomach, this will resolve once antibiotics are complete.  You are welcome to take a probiotic, eat yogurt, take Imodium while taking this medication.  Please avoid other systemic medications such as Maalox, Pepto-Bismol or milk of magnesia as they can interfere with the body's ability to absorb the antibiotics.       Advil, Motrin (ibuprofen): This is a good anti-inflammatory medication which addresses aches, pains and inflammation of the upper airways that causes sinus and nasal congestion as well as in the lower airways which makes your cough feel tight and sometimes burn.  I recommend that you take between 400 to 600 mg every 6-8 hours as needed.  Please do not take more than 2400 mg of ibuprofen in a 24-hour period and please do not take high doses of ibuprofen for more than 3 days in a row as this can lead to stomach ulcers.   If symptoms have not meaningfully improved in the next 5 to 7 days, please return for repeat evaluation or follow-up with your regular provider.  If symptoms have worsened in the next 3 to 5 days, please return for repeat evaluation or follow-up with your regular provider.    Thank you for visiting urgent care today.  We appreciate the opportunity to participate in your care.       This office note has been dictated using Teaching laboratory technician.  Unfortunately, this method of dictation can sometimes  lead to typographical or grammatical errors.  I apologize for your inconvenience in advance if this occurs.  Please do not hesitate to reach out to me if clarification is needed.      Theadora Rama Scales, [place] 08/15/22 [phone]

## 2022-08-14 NOTE — Discharge Instructions (Addendum)
Your strep test today is negative.  Streptococcal throat culture will be performed per our protocol, please keep in mind that the rapid strep test that we perform here at urgent care only catches 40% of strep throat infections.     Based on my physical exam findings and the history you provided to me today, I recommend that you begin antibiotics now for presumed bacterial infection in your sinuses which may be causing pain in your throat along with bloody sputum.  If your strep culture result is positive, the antibiotics that I have prescribed for you will treat this as well..  I have sent a prescription to your pharmacy.  After 24 hours of antibiotics, you should begin to feel significantly better.      Please read below to learn more about the medications, dosages and frequencies that I recommend to help alleviate your symptoms and to get you feeling better soon:   Z-Pak (azithromycin):  Please take two (2) tablets on day one and one tablet daily thereafter until the prescription is complete.This antibiotic can cause upset stomach, this will resolve once antibiotics are complete.  You are welcome to take a probiotic, eat yogurt, take Imodium while taking this medication.  Please avoid other systemic medications such as Maalox, Pepto-Bismol or milk of magnesia as they can interfere with the body's ability to absorb the antibiotics.       Advil, Motrin (ibuprofen): This is a good anti-inflammatory medication which addresses aches, pains and inflammation of the upper airways that causes sinus and nasal congestion as well as in the lower airways which makes your cough feel tight and sometimes burn.  I recommend that you take between 400 to 600 mg every 6-8 hours as needed.  Please do not take more than 2400 mg of ibuprofen in a 24-hour period and please do not take high doses of ibuprofen for more than 3 days in a row as this can lead to stomach ulcers.   If symptoms have not meaningfully improved in the  next 5 to 7 days, please return for repeat evaluation or follow-up with your regular provider.  If symptoms have worsened in the next 3 to 5 days, please return for repeat evaluation or follow-up with your regular provider.    Thank you for visiting urgent care today.  We appreciate the opportunity to participate in your care.

## 2022-08-15 ENCOUNTER — Telehealth (HOSPITAL_COMMUNITY): Payer: Self-pay | Admitting: Urgent Care

## 2022-08-15 LAB — CULTURE, GROUP A STREP (THRC)

## 2022-08-15 MED ORDER — LIDOCAINE VISCOUS HCL 2 % MT SOLN: 5.0000 mL | Freq: Three times a day (TID) | OROMUCOSAL | 0 refills | Status: DC | PRN

## 2022-08-15 MED ORDER — ETODOLAC 500 MG PO TABS: 500.0000 mg | ORAL_TABLET | Freq: Two times a day (BID) | ORAL | 0 refills | Status: AC

## 2022-08-15 NOTE — Telephone Encounter (Signed)
Pt called front desk this morning requesting something for pain in the throat. Pt was given zpack on 5/19 and cannot take steroids due to her glaucoma. Ibuprofen was recommended at time of visit, however apparently not strong enough. Will therefore DC advil and switch to stronger NSAID for a few days. Will also call in magic mouthwash. Any continued or worsening sx will require recheck in office.  Pt also instructed to DC her mobic x 5 days while on the NSAID prescribed today. No additional OTC Meds other than tylenol.

## 2022-08-16 LAB — CULTURE, GROUP A STREP (THRC)

## 2022-08-17 ENCOUNTER — Ambulatory Visit (INDEPENDENT_AMBULATORY_CARE_PROVIDER_SITE_OTHER): Payer: Medicare HMO

## 2022-08-17 DIAGNOSIS — J309 Allergic rhinitis, unspecified: Secondary | ICD-10-CM | POA: Diagnosis not present

## 2022-08-19 ENCOUNTER — Encounter: Payer: Self-pay | Admitting: Internal Medicine

## 2022-08-19 ENCOUNTER — Other Ambulatory Visit: Payer: Self-pay

## 2022-08-19 ENCOUNTER — Ambulatory Visit (INDEPENDENT_AMBULATORY_CARE_PROVIDER_SITE_OTHER): Payer: Medicare HMO | Admitting: Internal Medicine

## 2022-08-19 VITALS — BP 128/92 | HR 78 | Temp 98.2°F | Ht 69.0 in | Wt >= 6400 oz

## 2022-08-19 DIAGNOSIS — J3089 Other allergic rhinitis: Secondary | ICD-10-CM | POA: Diagnosis not present

## 2022-08-19 DIAGNOSIS — J302 Other seasonal allergic rhinitis: Secondary | ICD-10-CM | POA: Diagnosis not present

## 2022-08-19 DIAGNOSIS — J4540 Moderate persistent asthma, uncomplicated: Secondary | ICD-10-CM | POA: Diagnosis not present

## 2022-08-19 DIAGNOSIS — R0982 Postnasal drip: Secondary | ICD-10-CM

## 2022-08-19 DIAGNOSIS — J029 Acute pharyngitis, unspecified: Secondary | ICD-10-CM

## 2022-08-19 DIAGNOSIS — J069 Acute upper respiratory infection, unspecified: Secondary | ICD-10-CM | POA: Diagnosis not present

## 2022-08-19 MED ORDER — AZELASTINE HCL 0.1 % NA SOLN: NASAL | 5 refills | Status: DC

## 2022-08-19 MED ORDER — FEXOFENADINE HCL 180 MG PO TABS
180.0000 mg | ORAL_TABLET | Freq: Every day | ORAL | 5 refills | Status: DC
Start: 2022-08-19 — End: 2022-12-09

## 2022-08-19 MED ORDER — ALBUTEROL SULFATE HFA 108 (90 BASE) MCG/ACT IN AERS: INHALATION_SPRAY | RESPIRATORY_TRACT | 1 refills | Status: DC

## 2022-08-19 MED ORDER — IPRATROPIUM BROMIDE 0.06 % NA SOLN: 2.0000 | Freq: Four times a day (QID) | NASAL | 5 refills | Status: DC | PRN

## 2022-08-19 MED ORDER — BUDESONIDE-FORMOTEROL FUMARATE 160-4.5 MCG/ACT IN AERO: 2.0000 | INHALATION_SPRAY | Freq: Two times a day (BID) | RESPIRATORY_TRACT | 5 refills | Status: DC

## 2022-08-19 MED ORDER — MONTELUKAST SODIUM 10 MG PO TABS: 10.0000 mg | ORAL_TABLET | Freq: Every day | ORAL | 5 refills | Status: DC

## 2022-08-19 NOTE — Progress Notes (Signed)
FOLLOW UP Date of Service/Encounter:  08/19/22   Subjective:  Tiffany Velasquez (DOB: 21-Nov-1995) is a 27 y.o. female who [facility] for a follow up for an acute visit.    History obtained from: chart review and patient. Last visit was with Nehemiah Settle on 05/30/2022 for moderate persistent asth,a allergic rhinitis, eczema and multiple food allergies.  Some trouble with cough. Dicussed restarting Symbicort which she was previously on.  Also on Singulair, Zyrtec, Azelastine.   Since last visit, she reports having to go to the urgent care for sore throat for over a week.  Also have mucous production with throat clearing and feels like a scratch, gritty feeling in her throat.  Also having post nasal drip with congestion.  Taking Zyrtec and Azelastine daily but isn't sure if zyrtec works much for her. Rapid strep was negative. CXR was unremarkable per read.  Given azithromycin  Also drinking warm tea which helps. No high fevers or purulent drainage. Reports asthma is fine, isn't have trouble with coughing/wheezing/SOB. Taking her Symbicort twice daily; rarely needs her albuterol.   Past Medical History: Past Medical History:  Diagnosis Date   Asthma    Eczema    Morbid obesity (HCC)     Objective:  BP (!) 128/92   Pulse 78   Temp 98.2 F (36.8 C) (Temporal)   Ht 5\' 9"  (1.753 m)   Wt (!) 452 lb 4.8 oz (205.2 kg)   LMP 07/18/2022 (Approximate)   SpO2 98%   BMI 66.79 kg/m  Body mass index is 66.79 kg/m. Physical Exam: GEN: alert, well developed HEENT: clear conjunctiva, TM grey and translucent, nose with moderate inferior turbinate hypertrophy, boggy nasal mucosa, clear rhinorrhea, + cobblestoning HEART: regular rate and rhythm, no murmur LUNGS: clear to auscultation bilaterally, no coughing, unlabored respiration SKIN: no rashes or lesions  Spirometry:  Tracings reviewed. Her effort: Good reproducible efforts. FVC: 3.33L FEV1: 2.71L,  82% predicted FEV1/FVC ratio: 81% Interpretation: Spirometry consistent with normal pattern.  Please see scanned spirometry results for details.   Assessment:   1. Seasonal and perennial allergic rhinitis   2. Moderate persistent asthma without complication   3. Post-nasal drainage   4. Sore throat   5. Viral URI     Plan/Recommendations:  Allergic rhinitis  Post nasal drainage, sore throat - Discussed no findings concerning for bacterial cause. Might be related to her allergic inflammation and likely also had a viral infection with lingering symptoms that should improve.  Will also try to help dry out the drainage she is having.  - Use nasal saline rinses before nose sprays such as with Neilmed Sinus Rinse. Use distilled water.   - Use Azelastine 1-2 sprays each nostril twice daily. Aim upward and outward. - Use Ipratroprium 1-2 sprays up to four times daily as needed for runny nose. Aim upward and outward. - Use Allegra 180mg  daily. This replaces the Zyrtec.  - Use Singulair 10mg  daily.  Stop if there are any mood/behavioral changes. - Continue with allergy shots at the same schedule and have access to your epinephrine auto injector device.  - Use warm tea with honey and salt warm water gargle for sore throat related to drainage.    Moderate persistent asthma   - Daily controller medication(s):  continue Symbicort 160/4.5 two puffs twice daily with spacer.  - Rescue medications: Albuterol 2  puffs every 4-6 hours as needed for cough, wheeze, tightness in chest, or shortness  of breath - Changes during respiratory infections or as flare: add Flovent to 2 puffs twice daily for ONE TO TWO WEEKS - Asthma control goals:  * Full participation in all desired activities (may need albuterol before activity) * Albuterol use two time or less a week on average (not counting use with activity) * Cough interfering with sleep two time or less a month * Oral steroids no more than once a  year * No hospitalizations  3. Atopic dermatitis - Continue with moisturizing  daily. - Continue with [employer].   - Continue with your medicated ointments as needed.   4. Multiple food allergies (peanuts, shellfish, multiple fruits/vegetables) - EpiPen is up to date. - Continue to avoid peanuts, tree nuts, and shellfish.   Return in about 3 months (around 11/19/2022).  Alesia Morin, MD [facility]

## 2022-08-19 NOTE — Patient Instructions (Addendum)
1. Moderate persistent asthma   - Daily controller medication(s):  continue Symbicort 160/4.5 two puffs twice daily with spacer.  - Rescue medications: Albuterol 2  puffs every 4-6 hours as needed for cough, wheeze, tightness in chest, or shortness of breath - Changes during respiratory infections or as flare: add Flovent to 2 puffs twice daily for ONE TO TWO WEEKS - Asthma control goals:  * Full participation in all desired activities (may need albuterol before activity) * Albuterol use two time or less a week on average (not counting use with activity) * Cough interfering with sleep two time or less a month * Oral steroids no more than once a year * No hospitalizations  2.  Allergic rhinitis  - Use nasal saline rinses before nose sprays such as with Neilmed Sinus Rinse. Use distilled water.   - Use Azelastine 1-2 sprays each nostril twice daily. Aim upward and outward. - Use Ipratroprium 1-2 sprays up to four times daily as needed for runny nose. Aim upward and outward. - Use Allegra 180mg  daily. This replaces the Zyrtec.  - Use Singulair 10mg  daily.  Stop if there are any mood/behavioral changes. - Continue with allergy shots at the same schedule and have access to your epinephrine auto injector device.  - Use warm tea with honey and salt warm water gargle for sore throat related to drainage.    3. Atopic dermatitis - Continue with moisturizing  daily. - Continue with [employer].   - Continue with your medicated ointments as needed.   4. Multiple food allergies (peanuts, shellfish, multiple fruits/vegetables) - EpiPen is up to date.Refill sent  - Continue to avoid peanuts, tree nuts, and shellfish.  - Schedule an appointment for skin testing to select foods. You will need to be of Astelin nasal spray and cetirizine for 3 days prior to this appointment

## 2022-08-23 ENCOUNTER — Ambulatory Visit (INDEPENDENT_AMBULATORY_CARE_PROVIDER_SITE_OTHER): Payer: Medicare HMO | Admitting: *Deleted

## 2022-08-23 DIAGNOSIS — J309 Allergic rhinitis, unspecified: Secondary | ICD-10-CM | POA: Diagnosis not present

## 2022-08-24 ENCOUNTER — Other Ambulatory Visit: Payer: Self-pay | Admitting: Family

## 2022-08-24 DIAGNOSIS — H30033 Focal chorioretinal inflammation, peripheral, bilateral: Secondary | ICD-10-CM | POA: Diagnosis not present

## 2022-08-30 DIAGNOSIS — H30033 Focal chorioretinal inflammation, peripheral, bilateral: Secondary | ICD-10-CM | POA: Diagnosis not present

## 2022-08-30 DIAGNOSIS — H44113 Panuveitis, bilateral: Secondary | ICD-10-CM | POA: Diagnosis not present

## 2022-08-30 DIAGNOSIS — H209 Unspecified iridocyclitis: Secondary | ICD-10-CM | POA: Diagnosis not present

## 2022-08-30 DIAGNOSIS — H4043X4 Glaucoma secondary to eye inflammation, bilateral, indeterminate stage: Secondary | ICD-10-CM | POA: Diagnosis not present

## 2022-08-30 DIAGNOSIS — Z79899 Other long term (current) drug therapy: Secondary | ICD-10-CM | POA: Diagnosis not present

## 2022-08-30 DIAGNOSIS — H3581 Retinal edema: Secondary | ICD-10-CM | POA: Diagnosis not present

## 2022-08-30 DIAGNOSIS — H4043X1 Glaucoma secondary to eye inflammation, bilateral, mild stage: Secondary | ICD-10-CM | POA: Diagnosis not present

## 2022-08-30 DIAGNOSIS — Z961 Presence of intraocular lens: Secondary | ICD-10-CM | POA: Diagnosis not present

## 2022-08-30 DIAGNOSIS — H40043 Steroid responder, bilateral: Secondary | ICD-10-CM | POA: Diagnosis not present

## 2022-09-01 ENCOUNTER — Ambulatory Visit (INDEPENDENT_AMBULATORY_CARE_PROVIDER_SITE_OTHER): Payer: Medicare HMO

## 2022-09-01 DIAGNOSIS — J309 Allergic rhinitis, unspecified: Secondary | ICD-10-CM | POA: Diagnosis not present

## 2022-09-16 ENCOUNTER — Ambulatory Visit (INDEPENDENT_AMBULATORY_CARE_PROVIDER_SITE_OTHER): Payer: Medicare HMO

## 2022-09-16 DIAGNOSIS — J309 Allergic rhinitis, unspecified: Secondary | ICD-10-CM

## 2022-09-16 NOTE — Progress Notes (Signed)
  I attempted to contact the patient for her scheduled Virtual Telephone Annual Wellness Visit. No answer. I left a detailed message on the patient's voicemail.   

## 2022-09-16 NOTE — Patient Instructions (Signed)

## 2022-09-28 ENCOUNTER — Ambulatory Visit (INDEPENDENT_AMBULATORY_CARE_PROVIDER_SITE_OTHER): Payer: Medicare HMO | Admitting: *Deleted

## 2022-09-28 DIAGNOSIS — J309 Allergic rhinitis, unspecified: Secondary | ICD-10-CM

## 2022-10-21 ENCOUNTER — Ambulatory Visit (INDEPENDENT_AMBULATORY_CARE_PROVIDER_SITE_OTHER): Payer: Medicare HMO | Admitting: *Deleted

## 2022-10-21 DIAGNOSIS — J309 Allergic rhinitis, unspecified: Secondary | ICD-10-CM | POA: Diagnosis not present

## 2022-10-31 DIAGNOSIS — R7881 Bacteremia: Secondary | ICD-10-CM | POA: Diagnosis not present

## 2022-11-01 DIAGNOSIS — H4043X1 Glaucoma secondary to eye inflammation, bilateral, mild stage: Secondary | ICD-10-CM | POA: Diagnosis not present

## 2022-11-01 DIAGNOSIS — H4043X4 Glaucoma secondary to eye inflammation, bilateral, indeterminate stage: Secondary | ICD-10-CM | POA: Diagnosis not present

## 2022-11-01 DIAGNOSIS — Z961 Presence of intraocular lens: Secondary | ICD-10-CM | POA: Diagnosis not present

## 2022-11-01 DIAGNOSIS — H30033 Focal chorioretinal inflammation, peripheral, bilateral: Secondary | ICD-10-CM | POA: Diagnosis not present

## 2022-11-01 DIAGNOSIS — H44113 Panuveitis, bilateral: Secondary | ICD-10-CM | POA: Diagnosis not present

## 2022-11-01 DIAGNOSIS — H3581 Retinal edema: Secondary | ICD-10-CM | POA: Diagnosis not present

## 2022-11-01 DIAGNOSIS — H209 Unspecified iridocyclitis: Secondary | ICD-10-CM | POA: Diagnosis not present

## 2022-11-01 DIAGNOSIS — Z79899 Other long term (current) drug therapy: Secondary | ICD-10-CM | POA: Diagnosis not present

## 2022-11-01 DIAGNOSIS — H40043 Steroid responder, bilateral: Secondary | ICD-10-CM | POA: Diagnosis not present

## 2022-11-02 DIAGNOSIS — H30033 Focal chorioretinal inflammation, peripheral, bilateral: Secondary | ICD-10-CM | POA: Diagnosis not present

## 2022-11-02 DIAGNOSIS — Z79899 Other long term (current) drug therapy: Secondary | ICD-10-CM | POA: Diagnosis not present

## 2022-11-07 ENCOUNTER — Ambulatory Visit (INDEPENDENT_AMBULATORY_CARE_PROVIDER_SITE_OTHER): Payer: Medicare HMO | Admitting: Family Medicine

## 2022-11-07 ENCOUNTER — Encounter: Payer: Self-pay | Admitting: Family Medicine

## 2022-11-07 ENCOUNTER — Other Ambulatory Visit: Payer: Self-pay

## 2022-11-07 ENCOUNTER — Telehealth: Payer: Self-pay | Admitting: Family Medicine

## 2022-11-07 VITALS — BP 130/90 | HR 109 | Temp 98.2°F | Resp 16 | Wt >= 6400 oz

## 2022-11-07 DIAGNOSIS — J4551 Severe persistent asthma with (acute) exacerbation: Secondary | ICD-10-CM

## 2022-11-07 DIAGNOSIS — T7800XD Anaphylactic reaction due to unspecified food, subsequent encounter: Secondary | ICD-10-CM

## 2022-11-07 DIAGNOSIS — J301 Allergic rhinitis due to pollen: Secondary | ICD-10-CM

## 2022-11-07 DIAGNOSIS — K219 Gastro-esophageal reflux disease without esophagitis: Secondary | ICD-10-CM

## 2022-11-07 DIAGNOSIS — R051 Acute cough: Secondary | ICD-10-CM | POA: Diagnosis not present

## 2022-11-07 DIAGNOSIS — Z9189 Other specified personal risk factors, not elsewhere classified: Secondary | ICD-10-CM

## 2022-11-07 DIAGNOSIS — J4540 Moderate persistent asthma, uncomplicated: Secondary | ICD-10-CM | POA: Diagnosis not present

## 2022-11-07 DIAGNOSIS — L2084 Intrinsic (allergic) eczema: Secondary | ICD-10-CM

## 2022-11-07 MED ORDER — BREO ELLIPTA 100-25 MCG/ACT IN AEPB: 1.0000 | INHALATION_SPRAY | Freq: Every day | RESPIRATORY_TRACT | 5 refills | Status: DC

## 2022-11-07 MED ORDER — ALBUTEROL SULFATE HFA 108 (90 BASE) MCG/ACT IN AERS: INHALATION_SPRAY | RESPIRATORY_TRACT | 1 refills | Status: DC

## 2022-11-07 MED ORDER — FLUTICASONE PROPIONATE 50 MCG/ACT NA SUSP: 1.0000 | Freq: Every day | NASAL | 3 refills | Status: AC

## 2022-11-07 MED ORDER — MONTELUKAST SODIUM 10 MG PO TABS: 10.0000 mg | ORAL_TABLET | Freq: Every day | ORAL | 5 refills | Status: DC

## 2022-11-07 MED ORDER — ARNUITY ELLIPTA 100 MCG/ACT IN AEPB: INHALATION_SPRAY | RESPIRATORY_TRACT | 2 refills | Status: DC

## 2022-11-07 MED ORDER — FAMOTIDINE 20 MG PO TABS
20.0000 mg | ORAL_TABLET | Freq: Two times a day (BID) | ORAL | 1 refills | Status: DC
Start: 2022-11-07 — End: 2022-11-23

## 2022-11-07 NOTE — Progress Notes (Unsigned)
[address] [place] [address] Dept: [phone]  FOLLOW UP NOTE  Patient ID: Tiffany Velasquez, female    DOB: December 16, 1995  Age: 27 y.o. MRN: [id number] Date of Office Visit: 11/07/2022  Assessment  Chief Complaint: Cough (Productive for a week and a half now. More during the night time when she is trying to go to sleep. Color is brown, light yellow. Throwing up from time to time because of the cough. ), Asthma (Using her pump more often than usual. Up to 4 times a day regular pump. Wheezing. ), and Nasal Congestion (Color with blood and clear with blood off and on. )  HPI Tiffany Velasquez is a 27 year old female who presents to the clinic for acute evaluation of asthma and nasal congestion.  She was last seen in this clinic on 08/19/2022 by Dr. Allena Katz for evaluation of asthma, allergic rhinitis, atopic dermatitis, and symptoms of URI.  She continues allergen immunotherapy directed toward grass pollen, weed pollen, tree pollen, mold, dust mite, cat, dog, and cockroach.  She began allergen immunotherapy on 09/15/2015   Drug Allergies:  Allergies  Allergen Reactions   Peanut-Containing Drug Products Swelling    Throat swelling   Shellfish Allergy Swelling    Throat swelling    Apple Juice    Orange Fruit [Citrus]    Peach Flavor    Tomato    Tree Extract     ALLERGIC TO TREE NUTS    Physical Exam: BP (!) 130/90   Pulse (!) 109   Temp 98.2 F (36.8 C) (Temporal)   Resp 16   Wt (!) 433 lb 8 oz (196.6 kg)   SpO2 96%   BMI 64.02 kg/m    Physical Exam  Diagnostics: Deferred due to cough  Assessment and Plan: 1. Not well controlled severe persistent asthma with acute exacerbation     No orders of the defined types were placed in this encounter.   Patient Instructions  Asthma Continue montelukast 10 mg once a day to prevent cough or wheeze Increase Symbicort 160 to 2 puffs twice a day to prevent cough or wheeze Continue albuterol 2 puffs once every 4 hours as needed  for cough or wheeze You may use albuterol 2 puffs 5 to 15 minutes before activity to decrease cough or wheeze For now and for asthma flare, begin Flovent 110-2 puffs twice a day for 1 to 2 weeks or until cough and wheeze free If you continue to cough we will move forward with a chest xray  Allergic rhinitis Continue allergen avoidance measures directed toward grass pollen, weed pollen, tree pollen, mold, dust mite, cat, dog, and cockroach as listed below Continue allergen immunotherapy and have access to an epinephrine autoinjector set per protocol Continue azelastine 2 sprays in each nostril up to twice a day as needed for runny nose or nasal drainage Continue ipratropium 2 sprays in each nostril up to twice a day as needed for runny nose Continue Allegra 180 mg once a day as needed for runny nose or itch Consider saline nasal rinses as needed for nasal symptoms. Use this before any medicated nasal sprays for best result  Atopic dermatitis Continue twice a day moisturizing routine Continue mometasone once a day as needed for red and itchy areas below your face. Do not use longer than 2 weeks in a row  Reflux Begin famotidine 20 mg twice a day for reflux control. We will reevaluate in 30 days  Cough Begin tessalon Perles 200  mg up to three times a day as needed Continue the treatment plans as listed above. Call the clinic if your cough worsens or if you develop a fever  COVID Your rapid COVID testing was negative in the clinic today  Call the clinic if this treatment plan is not working well for you.  Follow up in 1 month or sooner if needed.  Reducing Pollen Exposure The American Academy of Allergy, Asthma and Immunology suggests the following steps to reduce your exposure to pollen during allergy seasons. Do not hang sheets or clothing out to dry; pollen may collect on these items. Do not mow lawns or spend time around freshly cut grass; mowing stirs up pollen. Keep windows  closed at night.  Keep car windows closed while driving. Minimize morning activities outdoors, a time when pollen counts are usually at their highest. Stay indoors as much as possible when pollen counts or humidity is high and on windy days when pollen tends to remain in the air longer. Use air conditioning when possible.  Many air conditioners have filters that trap the pollen spores. Use a HEPA room air filter to remove pollen form the indoor air you breathe.  Control of Mold Allergen Mold and fungi can grow on a variety of surfaces provided certain temperature and moisture conditions exist.  Outdoor molds grow on plants, decaying vegetation and soil.  The major outdoor mold, Alternaria and Cladosporium, are found in very high numbers during hot and dry conditions.  Generally, a late Summer - Fall peak is seen for common outdoor fungal spores.  Rain will temporarily lower outdoor mold spore count, but counts rise rapidly when the rainy period ends.  The most important indoor molds are Aspergillus and Penicillium.  Dark, humid and poorly ventilated basements are ideal sites for mold growth.  The next most common sites of mold growth are the bathroom and the kitchen.  Outdoor [employer] Use air conditioning and keep windows closed Avoid exposure to decaying vegetation. Avoid leaf raking. Avoid grain handling. Consider wearing a face mask if working in moldy areas.  Indoor Mold Control Maintain humidity below 50%. Clean washable surfaces with 5% bleach solution. Remove sources e.g. Contaminated carpets.   Control of Dust Mite Allergen Dust mites play a major role in allergic asthma and rhinitis. They occur in environments with high humidity wherever human skin is found. Dust mites absorb humidity from the atmosphere (ie, they do not drink) and feed on organic matter (including shed human and animal skin). Dust mites are a microscopic type of insect that you cannot see with the naked eye. High  levels of dust mites have been detected from mattresses, pillows, carpets, upholstered furniture, bed covers, clothes, soft toys and any woven material. The principal allergen of the dust mite is found in its feces. A gram of dust may contain 1,000 mites and 250,000 fecal particles. Mite antigen is easily measured in the air during house cleaning activities. Dust mites do not bite and do not cause harm to humans, other than by triggering allergies/asthma.  Ways to decrease your exposure to dust mites in your home:  1. Encase mattresses, box springs and pillows with a mite-impermeable barrier or cover  2. Wash sheets, blankets and drapes weekly in hot water (130 F) with detergent and dry them in a dryer on the hot setting.  3. Have the room cleaned frequently with a vacuum cleaner and a damp dust-mop. For carpeting or rugs, vacuuming with a vacuum cleaner equipped  with a high-efficiency particulate air (HEPA) filter. The dust mite allergic individual should not be in a room which is being cleaned and should wait 1 hour after cleaning before going into the room.  4. Do not sleep on upholstered furniture (eg, couches).  5. If possible removing carpeting, upholstered furniture and drapery from the home is ideal. Horizontal blinds should be eliminated in the rooms where the person spends the most time (bedroom, study, television room). Washable vinyl, roller-type shades are optimal.  6. Remove all non-washable stuffed toys from the bedroom. Wash stuffed toys weekly like sheets and blankets above.  7. Reduce indoor humidity to less than 50%. Inexpensive humidity monitors can be purchased at most hardware stores. Do not use a humidifier as can make the problem worse and are not recommended.  Control of Dog or Cat Allergen Avoidance is the best way to manage a dog or cat allergy. If you have a dog or cat and are allergic to dog or cats, consider removing the dog or cat from the home. If you have a dog  or cat but don't want to find it a new home, or if your family wants a pet even though someone in the household is allergic, here are some strategies that may help keep symptoms at bay:  Keep the pet out of your bedroom and restrict it to only a few rooms. Be advised that keeping the dog or cat in only one room will not limit the allergens to that room. Don't pet, hug or kiss the dog or cat; if you do, wash your hands with soap and water. High-efficiency particulate air (HEPA) cleaners run continuously in a bedroom or living room can reduce allergen levels over time. Regular use of a high-efficiency vacuum cleaner or a central vacuum can reduce allergen levels. Giving your dog or cat a bath at least once a week can reduce airborne allergen.  Control of Cockroach Allergen Cockroach allergen has been identified as an important cause of acute attacks of asthma, especially in urban settings.  There are fifty-five species of cockroach that exist in the [place], however only three, the [place], [place] species produce allergen that can affect patients with Asthma.  Allergens can be obtained from fecal particles, egg casings and secretions from cockroaches.    Remove food sources. Reduce access to water. Seal access and entry points. Spray runways with 0.5-1% Diazinon or Chlorpyrifos Blow boric acid power under stoves and refrigerator. Place bait stations (hydramethylnon) at feeding sites.    No follow-ups on file.    Thank you for the opportunity to care for this patient.  Please do not hesitate to contact me with questions.  Thermon Leyland, FNP [facility]

## 2022-11-07 NOTE — Telephone Encounter (Signed)
Patient is inquiring about an air purifier, patient states she forgot to ask at her visit with Thurston Hole today. Patient would like to know if an air purifier would benefit her and if so would that be covered by insurance? Or would patient have to pay for one out of pocket.  Best contact number: [phone]

## 2022-11-07 NOTE — Patient Instructions (Addendum)
Asthma Continue montelukast 10 mg once a day to prevent cough or wheeze Begin Breo 100-1 puff once a day to prevent cough or wheeze Continue albuterol 2 puffs once every 4 hours as needed for cough or wheeze You may use albuterol 2 puffs 5 to 15 minutes before activity to decrease cough or wheeze For now and for asthma flare, begin Arnuity 100-1 puff once a day for 1 to 2 weeks or until cough and wheeze free If you continue to cough we will move forward with a chest xray  Allergic rhinitis Continue allergen avoidance measures directed toward grass pollen, weed pollen, tree pollen, mold, dust mite, cat, dog, and cockroach as listed below Continue allergen immunotherapy and have access to an epinephrine autoinjector set per protocol Continue azelastine 2 sprays in each nostril up to twice a day as needed for runny nose or nasal drainage Continue ipratropium 2 sprays in each nostril up to twice a day as needed for runny nose Continue Allegra 180 mg once a day as needed for runny nose or itch Consider saline nasal rinses as needed for nasal symptoms. Use this before any medicated nasal sprays for best result  Atopic dermatitis Continue twice a day moisturizing routine Continue mometasone once a day as needed for red and itchy areas below your face. Do not use longer than 2 weeks in a row  Reflux Begin famotidine 20 mg twice a day for reflux control. We will reevaluate in 30 days  Cough Begin tessalon Perles 200 mg up to three times a day as needed Continue the treatment plans as listed above. Call the clinic if your cough worsens or if you develop a fever  COVID Your rapid COVID testing was negative in the clinic today  Call the clinic if this treatment plan is not working well for you.  Follow up in 1 month or sooner if needed.  Reducing Pollen Exposure The American Academy of Allergy, Asthma and Immunology suggests the following steps to reduce your exposure to pollen during allergy  seasons. Do not hang sheets or clothing out to dry; pollen may collect on these items. Do not mow lawns or spend time around freshly cut grass; mowing stirs up pollen. Keep windows closed at night.  Keep car windows closed while driving. Minimize morning activities outdoors, a time when pollen counts are usually at their highest. Stay indoors as much as possible when pollen counts or humidity is high and on windy days when pollen tends to remain in the air longer. Use air conditioning when possible.  Many air conditioners have filters that trap the pollen spores. Use a HEPA room air filter to remove pollen form the indoor air you breathe.  Control of Mold Allergen Mold and fungi can grow on a variety of surfaces provided certain temperature and moisture conditions exist.  Outdoor molds grow on plants, decaying vegetation and soil.  The major outdoor mold, Alternaria and Cladosporium, are found in very high numbers during hot and dry conditions.  Generally, a late Summer - Fall peak is seen for common outdoor fungal spores.  Rain will temporarily lower outdoor mold spore count, but counts rise rapidly when the rainy period ends.  The most important indoor molds are Aspergillus and Penicillium.  Dark, humid and poorly ventilated basements are ideal sites for mold growth.  The next most common sites of mold growth are the bathroom and the kitchen.  Outdoor [employer] Use air conditioning and keep windows closed Avoid exposure to decaying  vegetation. Avoid leaf raking. Avoid grain handling. Consider wearing a face mask if working in moldy areas.  Indoor Mold Control Maintain humidity below 50%. Clean washable surfaces with 5% bleach solution. Remove sources e.g. Contaminated carpets.   Control of Dust Mite Allergen Dust mites play a major role in allergic asthma and rhinitis. They occur in environments with high humidity wherever human skin is found. Dust mites absorb humidity from the  atmosphere (ie, they do not drink) and feed on organic matter (including shed human and animal skin). Dust mites are a microscopic type of insect that you cannot see with the naked eye. High levels of dust mites have been detected from mattresses, pillows, carpets, upholstered furniture, bed covers, clothes, soft toys and any woven material. The principal allergen of the dust mite is found in its feces. A gram of dust may contain 1,000 mites and 250,000 fecal particles. Mite antigen is easily measured in the air during house cleaning activities. Dust mites do not bite and do not cause harm to humans, other than by triggering allergies/asthma.  Ways to decrease your exposure to dust mites in your home:  1. Encase mattresses, box springs and pillows with a mite-impermeable barrier or cover  2. Wash sheets, blankets and drapes weekly in hot water (130 F) with detergent and dry them in a dryer on the hot setting.  3. Have the room cleaned frequently with a vacuum cleaner and a damp dust-mop. For carpeting or rugs, vacuuming with a vacuum cleaner equipped with a high-efficiency particulate air (HEPA) filter. The dust mite allergic individual should not be in a room which is being cleaned and should wait 1 hour after cleaning before going into the room.  4. Do not sleep on upholstered furniture (eg, couches).  5. If possible removing carpeting, upholstered furniture and drapery from the home is ideal. Horizontal blinds should be eliminated in the rooms where the person spends the most time (bedroom, study, television room). Washable vinyl, roller-type shades are optimal.  6. Remove all non-washable stuffed toys from the bedroom. Wash stuffed toys weekly like sheets and blankets above.  7. Reduce indoor humidity to less than 50%. Inexpensive humidity monitors can be purchased at most hardware stores. Do not use a humidifier as can make the problem worse and are not recommended.  Control of Dog or Cat  Allergen Avoidance is the best way to manage a dog or cat allergy. If you have a dog or cat and are allergic to dog or cats, consider removing the dog or cat from the home. If you have a dog or cat but don't want to find it a new home, or if your family wants a pet even though someone in the household is allergic, here are some strategies that may help keep symptoms at bay:  Keep the pet out of your bedroom and restrict it to only a few rooms. Be advised that keeping the dog or cat in only one room will not limit the allergens to that room. Don't pet, hug or kiss the dog or cat; if you do, wash your hands with soap and water. High-efficiency particulate air (HEPA) cleaners run continuously in a bedroom or living room can reduce allergen levels over time. Regular use of a high-efficiency vacuum cleaner or a central vacuum can reduce allergen levels. Giving your dog or cat a bath at least once a week can reduce airborne allergen.  Control of Cockroach Allergen Cockroach allergen has been identified as an important cause  of acute attacks of asthma, especially in urban settings.  There are fifty-five species of cockroach that exist in the [place], however only three, the [place], [place] species produce allergen that can affect patients with Asthma.  Allergens can be obtained from fecal particles, egg casings and secretions from cockroaches.    Remove food sources. Reduce access to water. Seal access and entry points. Spray runways with 0.5-1% Diazinon or Chlorpyrifos Blow boric acid power under stoves and refrigerator. Place bait stations (hydramethylnon) at feeding sites.

## 2022-11-08 ENCOUNTER — Encounter: Payer: Self-pay | Admitting: Family Medicine

## 2022-11-08 DIAGNOSIS — T7800XA Anaphylactic reaction due to unspecified food, initial encounter: Secondary | ICD-10-CM | POA: Insufficient documentation

## 2022-11-08 DIAGNOSIS — Z9189 Other specified personal risk factors, not elsewhere classified: Secondary | ICD-10-CM | POA: Insufficient documentation

## 2022-11-08 DIAGNOSIS — K219 Gastro-esophageal reflux disease without esophagitis: Secondary | ICD-10-CM | POA: Insufficient documentation

## 2022-11-08 NOTE — Telephone Encounter (Signed)
Left patient a message to call the office back in regards to Anne's recommendation.

## 2022-11-08 NOTE — Telephone Encounter (Signed)
Can you please have this patient call her insurance to see if they will pay for an air purifier and which model? Thank you

## 2022-11-09 NOTE — Telephone Encounter (Signed)
Patient called back regarding VM left for her regarding air purifier. She also has concerns about receiving her pills to her reduce her cough. She states the pharmacy got every medication except that please advise. [phone].

## 2022-11-10 MED ORDER — BENZONATATE 200 MG PO CAPS: 200.0000 mg | ORAL_CAPSULE | Freq: Three times a day (TID) | ORAL | 0 refills | Status: DC | PRN

## 2022-11-10 NOTE — Telephone Encounter (Signed)
I called patient and informed of Anne's note. I also informed that the tessalon pearls have been sent to the pharmacy.

## 2022-11-11 ENCOUNTER — Other Ambulatory Visit: Payer: Self-pay | Admitting: *Deleted

## 2022-11-11 ENCOUNTER — Telehealth: Payer: Self-pay | Admitting: *Deleted

## 2022-11-11 MED ORDER — WIXELA INHUB 100-50 MCG/ACT IN AEPB: 1.0000 | INHALATION_SPRAY | Freq: Every day | RESPIRATORY_TRACT | 5 refills | Status: DC

## 2022-11-11 NOTE — Telephone Encounter (Signed)
She does not really need a duel inhaler but in order for her to breathe let's try Wixela 100 one puff once a day please. Thank you so much!!

## 2022-11-11 NOTE — Telephone Encounter (Signed)
Received a fax from pharmacy stating that Arnuity is not covered, states that preferred alternatives are vreo, Advair, Wixela, Fluticasone Salmeterol. Please advise to change in inhaler. Thank You.

## 2022-11-11 NOTE — Telephone Encounter (Signed)
New prescription has been sent in. Called and informed the patient, she stated that she was able to pick up the Arnuity but that if there are any issues in the issue with getting it filled she will switch to the [facility].

## 2022-11-18 ENCOUNTER — Ambulatory Visit: Payer: Medicare HMO

## 2022-11-18 ENCOUNTER — Ambulatory Visit (INDEPENDENT_AMBULATORY_CARE_PROVIDER_SITE_OTHER): Payer: Medicare HMO | Admitting: *Deleted

## 2022-11-18 VITALS — Ht 69.0 in | Wt >= 6400 oz

## 2022-11-18 DIAGNOSIS — J309 Allergic rhinitis, unspecified: Secondary | ICD-10-CM | POA: Diagnosis not present

## 2022-11-18 DIAGNOSIS — Z0000 Encounter for general adult medical examination without abnormal findings: Secondary | ICD-10-CM

## 2022-11-18 NOTE — Patient Instructions (Signed)
Tiffany Velasquez , Thank you for taking time to come for your Medicare Wellness Visit. I appreciate your ongoing commitment to your health goals. Please review the following plan we discussed and let me know if I can assist you in the future.   Referrals/Orders/Follow-Ups/Clinician Recommendations: Aim for 30 minutes of exercise or brisk walking, 6-8 glasses of water, and 5 servings of fruits and vegetables each day.  This is a list of the screening recommended for you and due dates:  Health Maintenance  Topic Date Due   Pap Smear  Never done   Pap Smear  Never done   HPV Vaccine (3 - Risk 3-dose series) 11/12/2020   COVID-19 Vaccine (4 - 2023-24 season) 04/07/2022   Flu Shot  10/27/2022   Medicare Annual Wellness Visit  11/18/2023   DTaP/Tdap/Td vaccine (2 - Td or Tdap) 09/04/2027   Hepatitis C Screening  Completed   HIV Screening  Completed    Advanced directives: (ACP Link)Information on Advanced Care Planning can be found at [facility] Advance Health Care Directives Advance Health Care Directives ([email or web address]   Next Medicare Annual Wellness Visit scheduled for next year: Yes

## 2022-11-18 NOTE — Progress Notes (Signed)
Subjective:   Tiffany Velasquez is a 27 y.o. female who presents for an Initial Medicare Annual Wellness Visit.  Visit Complete: Virtual  I connected with  [name] on 11/18/22 by a audio enabled telemedicine application and verified that I am speaking with the correct person using two identifiers.  Patient Location: Home  Provider Location: Home Office  I discussed the limitations of evaluation and management by telemedicine. The patient expressed understanding and agreed to proceed.  Vital Signs: Because this visit was a virtual/telehealth visit, some criteria may be missing or patient reported. Any vitals not documented were not able to be obtained and vitals that have been documented are patient reported.   Review of Systems     Cardiac Risk Factors include: sedentary lifestyle;obesity (BMI >30kg/m2);family history of premature cardiovascular disease     Objective:    Today's Vitals   11/18/22 1926  Weight: (!) 433 lb (196.4 kg)  Height: 5\' 9"  (1.753 m)   Body mass index is 63.94 kg/m.     11/18/2022    7:35 PM 08/19/2021   10:29 AM 08/11/2021   12:08 PM 03/16/2021    9:56 AM 03/11/2021   10:52 AM 03/22/2019    9:23 PM 09/06/2017   11:16 AM  Advanced Directives  Does Patient Have a Medical Advance Directive? No No No No No No No  Would patient like information on creating a medical advance directive? Yes (MAU/Ambulatory/Procedural Areas - Information given) No - Patient declined No - Patient declined No - Patient declined No - Patient declined  No - Patient declined    Current Medications (verified) Outpatient Encounter Medications as of 11/18/2022  Medication Sig   Adalimumab 40 MG/0.4ML PNKT Inject into the skin.   albuterol (VENTOLIN HFA) 108 (90 Base) MCG/ACT inhaler Inhale 2 puffs every 4-6 hours as needed for cough, wheeze, tightness in chest, or shortness of breath   atropine 1 % ophthalmic solution Place 1 drop into the left eye once.   azelastine  (ASTELIN) 0.1 % nasal spray USE 2 SPRAYS IN EACH NOSTRIL TWICE DAILY AS NEEDED FOR RUNNY NOSE/DRAINAGE DOWN THROAT   AZELEX 20 % cream Apply topically daily.   benzonatate (TESSALON) 200 MG capsule Take 1 capsule (200 mg total) by mouth 3 (three) times daily as needed for cough.   bimatoprost (LUMIGAN) 0.01 % SOLN Apply to eye.   brimonidine (ALPHAGAN) 0.15 % ophthalmic solution 1 drop 3 (three) times daily.   cetirizine (ZYRTEC) 10 MG tablet TAKE 1 TABLET(10 MG) BY MOUTH DAILY   clindamycin (CLEOCIN T) 1 % lotion Apply topically 2 (two) times daily.   diphenhydrAMINE (BENADRYL) 50 MG/ML injection Inject 50 mg into the muscle.   dorzolamide-timolol (COSOPT) 2-0.5 % ophthalmic solution 1 drop 2 (two) times daily.   EPINEPHrine 0.3 mg/0.3 mL IJ SOAJ injection Inject 0.3 mg into the muscle as needed for anaphylaxis.   famotidine (PEPCID) 20 MG tablet Take 1 tablet (20 mg total) by mouth 2 (two) times daily.   fexofenadine (ALLEGRA ALLERGY) 180 MG tablet Take 1 tablet (180 mg total) by mouth daily.   fluticasone (FLONASE) 50 MCG/ACT nasal spray Place 1 spray into both nostrils daily.   Fluticasone Furoate (ARNUITY ELLIPTA) 100 MCG/ACT AEPB For asthma flares begin Arnuity 100-1 puff once a day for 1-2 weeks or until cough and wheeze free   fluticasone furoate-vilanterol (BREO ELLIPTA) 100-25 MCG/ACT AEPB Inhale 1 puff into the lungs daily.   folic acid (FOLVITE) 1 MG tablet Take  1 tablet by mouth daily.   GAMUNEX-C 20 GM/200ML SOLN    hydrOXYzine (ATARAX/VISTARIL) 10 MG tablet Take 1 tablet (10 mg total) by mouth daily as needed.   ipratropium (ATROVENT) 0.06 % nasal spray Place 2 sprays into both nostrils 4 (four) times daily as needed for rhinitis.   magic mouthwash (lidocaine, diphenhydrAMINE, alum & mag hydroxide) suspension Swish and spit 5 mLs 3 (three) times daily as needed (sore throat).   meloxicam (MOBIC) 15 MG tablet Take 1 tablet (15 mg total) by mouth daily.   mometasone (ELOCON) 0.1 %  cream Apply 1 application. topically 2 (two) times daily.   montelukast (SINGULAIR) 10 MG tablet Take 1 tablet (10 mg total) by mouth at bedtime.   mycophenolate (CELLCEPT) 500 MG tablet Take by mouth.   prednisoLONE acetate (PRED FORTE) 1 % ophthalmic suspension Apply to eye.   RETIN-A MICRO 0.04 % gel Apply 1 application. topically daily.   sirolimus (RAPAMUNE) 2 MG tablet Take by mouth.   sucralfate (CARAFATE) 1 GM/10ML suspension Take 10 mLs (1 g total) by mouth 4 (four) times daily -  with meals and at bedtime.   WIXELA INHUB 100-50 MCG/ACT AEPB Inhale 1 puff into the lungs daily.   [DISCONTINUED] budesonide-formoterol (SYMBICORT) 160-4.5 MCG/ACT inhaler Inhale 2 puffs into the lungs 2 (two) times daily.   No facility-administered encounter medications on file as of 11/18/2022.    Allergies (verified) Peanut-containing drug products, Shellfish allergy, Apple juice, Orange fruit [citrus], Peach flavor, Tomato, and Tree extract   History: Past Medical History:  Diagnosis Date   Asthma    Eczema    Morbid obesity (HCC)    Past Surgical History:  Procedure Laterality Date   CATARACT EXTRACTION     no past surgery     Family History  Problem Relation Age of Onset   Sudden death Cousin    Asthma Mother    Allergic rhinitis Father    Eczema Neg Hx    Urticaria Neg Hx    Social History   Socioeconomic History   Marital status: Single    Spouse name: Not on file   Number of children: Not on file   Years of education: Not on file   Highest education level: Not on file  Occupational History   Not on file  Tobacco Use   Smoking status: Never   Smokeless tobacco: Never  Vaping Use   Vaping status: Never Used  Substance and Sexual Activity   Alcohol use: No   Drug use: No   Sexual activity: Not Currently  Other Topics Concern   Not on file  Social History Narrative   Not on file   Social Determinants of Health   Financial Resource Strain: Low Risk  (11/18/2022)    Overall Financial Resource Strain (CARDIA)    Difficulty of Paying Living Expenses: Not hard at all  Food Insecurity: No Food Insecurity (11/18/2022)   Hunger Vital Sign    Worried About Running Out of Food in the Last Year: Never true    Ran Out of Food in the Last Year: Never true  Transportation Needs: No Transportation Needs (11/18/2022)   PRAPARE - Administrator, Civil Service (Medical): No    Lack of Transportation (Non-Medical): No  Physical Activity: Inactive (11/18/2022)   Exercise Vital Sign    Days of Exercise per Week: 0 days    Minutes of Exercise per Session: 0 min  Stress: No Stress Concern Present (11/18/2022)  [employer] of Occupational Health - Occupational Stress Questionnaire    Feeling of Stress : Not at all  Social Connections: Moderately Isolated (11/18/2022)   Social Connection and Isolation Panel [NHANES]    Frequency of Communication with Friends and Family: More than three times a week    Frequency of Social Gatherings with Friends and Family: Three times a week    Attends Religious Services: 1 to 4 times per year    Active Member of Clubs or Organizations: No    Attends Banker Meetings: Never    Marital Status: Never married    Tobacco Counseling Counseling given: Not Answered   Clinical Intake:  Pre-visit preparation completed: Yes  Pain : No/denies pain     Diabetes: No  How often do you need to have someone help you when you read instructions, pamphlets, or other written materials from your doctor or pharmacy?: 1 - Never  Interpreter Needed?: No  Information entered by :: Kandis Fantasia LPN   Activities of Daily Living    11/18/2022    7:33 PM  In your present state of health, do you have any difficulty performing the following activities:  Hearing? 0  Vision? 1  Difficulty concentrating or making decisions? 0  Walking or climbing stairs? 1  Dressing or bathing? 0  Doing errands, shopping? 0   Preparing Food and eating ? N  Using the Toilet? N  In the past six months, have you accidently leaked urine? N  Do you have problems with loss of bowel control? N  Managing your Medications? N  Managing your Finances? N  Housekeeping or managing your Housekeeping? N    Patient Care Team: Levin Erp, MD as PCP - General (Family Medicine) Elwin Mocha, MD as Consulting Physician (Ophthalmology) Dellis Anes Hetty Ely, MD as Consulting Physician (Allergy and Immunology) Bufford Buttner, MD as Referring Physician (Dermatology)  Indicate any recent Medical Services you may have received from other than [facility] providers in the past year (date may be approximate).     Assessment:   This is a routine wellness examination for [facility].  Hearing/Vision screen Hearing Screening - Comments:: Denies hearing difficulties   Vision Screening - Comments:: up to date with routine eye exams with Dr. Sherryll Burger    Dietary issues and exercise activities discussed:     Goals Addressed             This Visit's Progress    Increase physical activity        Depression Screen    11/18/2022    7:31 PM 08/19/2021   10:28 AM 07/16/2021    1:45 PM 03/16/2021    9:53 AM 03/11/2021   10:52 AM 09/06/2017   11:16 AM 08/01/2011   10:37 AM  PHQ 2/9 Scores  PHQ - 2 Score 0 0 0 0 2 0 0  PHQ- 9 Score  0 2 0 2      Fall Risk    11/18/2022    7:33 PM 03/16/2021    9:53 AM  Fall Risk   Falls in the past year? 0 0  Number falls in past yr: 0 0  Injury with Fall? 0 0  Risk for fall due to : No Fall Risks   Follow up Falls prevention discussed;Education provided;Falls evaluation completed     MEDICARE RISK AT HOME: Medicare Risk at Home Any stairs in or around the home?: No If so, are there any without handrails?: No Home free of loose throw  rugs in walkways, pet beds, electrical cords, etc?: Yes Adequate lighting in your home to reduce risk of falls?: Yes Life alert?: No Use of a  cane, walker or w/c?: No Grab bars in the bathroom?: No Shower chair or bench in shower?: No Elevated toilet seat or a handicapped toilet?: No  TIMED UP AND GO:  Was the test performed? No    Cognitive Function:        11/18/2022    7:34 PM  6CIT Screen  What Year? 0 points  What month? 0 points  What time? 0 points  Count back from 20 0 points  Months in reverse 0 points  Repeat phrase 0 points  Total Score 0 points    Immunizations Immunization History  Administered Date(s) Administered   COVID-19, mRNA, vaccine(Comirnaty)12 years and older 02/10/2022   H1N1 04/16/2008   HPV 9-valent 07/13/2020   HPV Quadrivalent 04/11/2014   Hepatitis A 04/16/2008   Influenza Split 03/09/2011, 03/29/2012   Influenza Whole 01/17/2007, 12/27/2007   Influenza,inj,Quad PF,6+ Mos 03/26/2013, 12/16/2013, 04/04/2016, 02/03/2017, 02/21/2018, 02/05/2020, 03/11/2021   Meningococcal Conjugate 04/11/2014   PFIZER(Purple Top)SARS-COV-2 Vaccination 02/14/2020   PPD Test 03/08/2021   Pfizer Covid-19 Vaccine Bivalent Booster 27yrs & up 02/23/2021   Tdap 09/03/2017   Varicella 04/16/2008    TDAP status: Up to date  Flu Vaccine status: Due, Education has been provided regarding the importance of this vaccine. Advised may receive this vaccine at local pharmacy or Health Dept. Aware to provide a copy of the vaccination record if obtained from local pharmacy or Health Dept. Verbalized acceptance and understanding.  Pneumococcal vaccine status: Up to date  Covid-19 vaccine status: Information provided on how to obtain vaccines.   Qualifies for Shingles Vaccine? No    Screening Tests Health Maintenance  Topic Date Due   PAP-Cervical Cytology Screening  Never done   PAP SMEAR-Modifier  Never done   HPV VACCINES (3 - Risk 3-dose series) 11/12/2020   COVID-19 Vaccine (4 - 2023-24 season) 04/07/2022   INFLUENZA VACCINE  10/27/2022   Medicare Annual Wellness (AWV)  11/18/2023   DTaP/Tdap/Td (2 -  Td or Tdap) 09/04/2027   Hepatitis C Screening  Completed   HIV Screening  Completed    Health Maintenance  Health Maintenance Due  Topic Date Due   PAP-Cervical Cytology Screening  Never done   PAP SMEAR-Modifier  Never done   HPV VACCINES (3 - Risk 3-dose series) 11/12/2020   COVID-19 Vaccine (4 - 2023-24 season) 04/07/2022   INFLUENZA VACCINE  10/27/2022    Lung Cancer Screening: (Low Dose CT Chest recommended if Age 25-80 years, 20 pack-year currently smoking OR have quit w/in 15years.) does not qualify.   Lung Cancer Screening Referral: n/a  Additional Screening:  Hepatitis C Screening: does qualify; Completed 07/13/20  Vision Screening: Recommended annual ophthalmology exams for early detection of glaucoma and other disorders of the eye. Is the patient up to date with their annual eye exam?  Yes  Who is the provider or what is the name of the office in which the patient attends annual eye exams? Dr. Sherryll Burger If pt is not established with a provider, would they like to be referred to a provider to establish care? No .   Dental Screening: Recommended annual dental exams for proper oral hygiene  Community Resource Referral / Chronic Care Management: CRR required this visit?  No   CCM required this visit?  No     Plan:     I  have personally reviewed and noted the following in the patient's chart:   Medical and social history Use of alcohol, tobacco or illicit drugs  Current medications and supplements including opioid prescriptions. Patient is not currently taking opioid prescriptions. Functional ability and status Nutritional status Physical activity Advanced directives List of other physicians Hospitalizations, surgeries, and ER visits in previous 12 months Vitals Screenings to include cognitive, depression, and falls Referrals and appointments  In addition, I have reviewed and discussed with patient certain preventive protocols, quality metrics, and best  practice recommendations. A written personalized care plan for preventive services as well as general preventive health recommendations were provided to patient.     Kandis Fantasia [place], [place]   1/61/0960   After Visit Summary: (MyChart) Due to this being a telephonic visit, the after visit summary with patients personalized plan was offered to patient via MyChart   Nurse Notes: No concerns at this time

## 2022-11-22 ENCOUNTER — Other Ambulatory Visit: Payer: Self-pay

## 2022-11-22 ENCOUNTER — Encounter: Payer: Self-pay | Admitting: Allergy & Immunology

## 2022-11-22 ENCOUNTER — Ambulatory Visit (INDEPENDENT_AMBULATORY_CARE_PROVIDER_SITE_OTHER): Payer: Medicare HMO | Admitting: Allergy & Immunology

## 2022-11-22 VITALS — BP 124/86 | HR 92 | Temp 98.2°F | Wt >= 6400 oz

## 2022-11-22 DIAGNOSIS — L2084 Intrinsic (allergic) eczema: Secondary | ICD-10-CM | POA: Diagnosis not present

## 2022-11-22 DIAGNOSIS — J4540 Moderate persistent asthma, uncomplicated: Secondary | ICD-10-CM | POA: Diagnosis not present

## 2022-11-22 DIAGNOSIS — J3089 Other allergic rhinitis: Secondary | ICD-10-CM

## 2022-11-22 DIAGNOSIS — J302 Other seasonal allergic rhinitis: Secondary | ICD-10-CM

## 2022-11-22 DIAGNOSIS — K219 Gastro-esophageal reflux disease without esophagitis: Secondary | ICD-10-CM | POA: Diagnosis not present

## 2022-11-22 DIAGNOSIS — T7800XD Anaphylactic reaction due to unspecified food, subsequent encounter: Secondary | ICD-10-CM | POA: Diagnosis not present

## 2022-11-22 MED ORDER — BENZONATATE 100 MG PO CAPS: 100.0000 mg | ORAL_CAPSULE | Freq: Three times a day (TID) | ORAL | 0 refills | Status: DC | PRN

## 2022-11-22 MED ORDER — MONTELUKAST SODIUM 10 MG PO TABS: 10.0000 mg | ORAL_TABLET | Freq: Every day | ORAL | 1 refills | Status: DC

## 2022-11-22 MED ORDER — PREDNISONE 10 MG PO TABS: ORAL_TABLET | ORAL | 0 refills | Status: DC

## 2022-11-22 NOTE — Patient Instructions (Addendum)
1. Moderate persistent asthma  - Lung testing looks worse today. - I think we should go back to Symbicort two puffs twice daily to see if this is better than the Breo. - Your lung testing was much better on the Symbicort compared to the [facility].  - We are going to increase to the higher Symbicort dose since you felt that this was working better for you.  - If your lung testing still looks bad at the next visit, we may need to consider adding a biologic (injectable asthma medication). - Start prednisone taper that I sent into the pharmacy.  - Neb machine given.  - Daily controller medication(s): Symbicort 16/4.5 two puffs twice daily with spacer - Prior to physical activity: albuterol 2 puffs 10-15 minutes before physical activity. - Rescue medications: albuterol 4 puffs every 4-6 hours as needed and albuterol nebulizer one vial every 4-6 hours as needed - Asthma control goals:  * Full participation in all desired activities (may need albuterol before activity) * Albuterol use two time or less a week on average (not counting use with activity) * Cough interfering with sleep two time or less a month * Oral steroids no more than once a year * No hospitalizations  2. Chronic allergic rhinitis - with recent anaphylaxis to your allergy shots.  - Continue with Singulair, cetirizine, ipratropium, and Astelin.   - Continue with allergy shots at the same schedule.   3. Atopic dermatitis - Continue with moisturizing twice daily. - Continue with [employer].   - Continue with your medicated ointments as needed.   4. Multiple food allergies (peanuts, shellfish, multiple fruits) - EpiPen is up to date.  - Continue to avoid peanuts, tree nuts, and shellfish.    5. Return in about 6 weeks (around 01/03/2023).    Please inform us of any Emergency Department visits, hospitalizations, or changes in symptoms. Call us before going to the ED for breathing or allergy symptoms since we might be able to  fit you in for a sick visit. Feel free to contact us anytime with any questions, problems, or concerns.  It was a pleasure to see you again today!  Websites that have reliable patient information: 1. American Academy of Asthma, Allergy, and Immunology: www.aaaai.org 2. Food Allergy Research and Education (FARE): foodallergy.org 3. Mothers of Asthmatics: http://www.asthmacommunitynetwork.org 4. American College of Allergy, Asthma, and Immunology: www.acaai.org   COVID-19 Vaccine Information can be found at: [email or web address] For questions related to vaccine distribution or appointments, please email [email or web address] or call [phone].   We realize that you might be concerned about having an allergic reaction to the COVID19 vaccines. To help with that concern, WE ARE OFFERING THE COVID19 VACCINES IN OUR OFFICE! Ask the front desk for dates!     "Like" [place] on Facebook and Instagram for our latest updates!      A healthy democracy works best when [employer] participate! Make sure you are registered to vote! If you have moved or changed any of your contact information, you will need to get this updated before voting!  In some cases, you MAY be able to register to vote online: [email or web address]

## 2022-11-22 NOTE — Progress Notes (Unsigned)
FOLLOW UP  Date of Service/Encounter:  11/22/22   Assessment:   Moderate persistent asthma without complication   Seasonal and perennial allergic rhinitis - on allergen immunotherapy with maintenance reached February 2021   Intrinsic atopic dermatitis - followed by Dr. 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   Subjective:   Tiffany Velasquez is a 27 y.o. female presenting today for follow up of No chief complaint on file.   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   Retinal edema 07/13/2018   Adverse food reaction 06/15/2017   Not well controlled moderate persistent asthma 01/16/2017   Intrinsic atopic dermatitis 01/16/2017   Low back pain 12/22/2015   Food allergy 12/06/2014   Healthcare maintenance 04/12/2014   Asthma, chronic 06/22/2012   Acute cough 06/22/2012   Family history of sudden cardiac death 08-29-11   Morbid obesity (HCC) 10/05/2009   CONSTIPATION, CHRONIC 04/16/2008   ATTENTION DEFICIT, W/HYPERACTIVITY 05/25/2006   ALLERGIC CONJUNCTIVITIS 05/25/2006   Allergic rhinitis 05/25/2006    Severe eczema 05/25/2006    History obtained from: chart review and {Persons; PED relatives w/patient:19415::"patient"}.  Tiffany Velasquez is a 27 y.o. female presenting for {Blank single:19197::"a food challenge","a drug challenge","skin testing","a sick visit","an evaluation of ***","a follow up visit"}.  She was last seen in August 2020 for around 2 weeks ago.  At that time, she was continued on montelukast 10 mg daily and started on Breo 100 mcg 1 puff once daily.  For her allergic rhinitis, she was continued on Astelin as well as ipratropium, Allegra, and allergen immunotherapy.  Atopic dermatitis was controlled with mometasone daily as needed.  She was also started on famotidine 20 mg twice daily for reflux control.  She had a cough and was given a prescription for Tessalon Perles to use as needed.  Her rapid COVID testing was negative.  Since last visit, she has continued to have some problems.   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 She has two twins who are 81 months old. She has been taking care of them since the begging of the summer. She is now strictly online for her schooling. She did like being in the daycare, but it is now better for her health.   Asthma/Respiratory Symptom History: She is able to move  better with the [facility] [place]. But she is still having a cough and wheeze. She was on Symbicort in May and her numbers were much better. She has never been on a biologic for her asthma.   Allergic Rhinitis Symptom History: Thurston Hole made a lot of  changes at the last visit and she overall feels better.  {Blank single:19197::"Food Allergy Symptom History: ***"," "}  {Blank single:19197::"Skin Symptom History: ***"," "}  {Blank single:19197::"GERD Symptom History: ***"," "}  She has infusions now. She gets them monthly for four days infused over that time period. She has been on this since December 2023.   Otherwise, there have been no changes to  her past medical history, surgical history, family history, or social history.    Review of systems otherwise negative other than that mentioned in the HPI.    Objective:   There were no vitals taken for this visit. There is no height or weight on file to calculate BMI.    Physical Exam   Diagnostic studies:    Spirometry: results abnormal (FEV1: 1.77/53%, FVC: 2.31/60%, FEV1/FVC: 77%).    Spirometry consistent with possible restrictive disease. {Blank single:19197::"Albuterol/Atrovent nebulizer","Xopenex/Atrovent nebulizer","Albuterol nebulizer","Albuterol four puffs via MDI","Xopenex four puffs via MDI"} treatment given in clinic with {Blank single:19197::"significant improvement in FEV1 per ATS criteria","significant improvement in FVC per ATS criteria","significant improvement in FEV1 and FVC per ATS criteria","improvement in FEV1, but not significant per ATS criteria","improvement in FVC, but not significant per ATS criteria","improvement in FEV1 and FVC, but not significant per ATS criteria","no improvement"}.  Allergy Studies: {Blank single:19197::"none","labs sent instead"," "}    {Blank single:19197::"Allergy testing results were read and interpreted by myself, documented by clinical staff."," "}      Malachi Bonds, MD  [facility]

## 2022-11-23 ENCOUNTER — Encounter: Payer: Self-pay | Admitting: Allergy & Immunology

## 2022-11-23 MED ORDER — AZELASTINE HCL 0.1 % NA SOLN: NASAL | 5 refills | Status: DC

## 2022-11-23 MED ORDER — MOMETASONE FUROATE 0.1 % EX CREA: 1.0000 | TOPICAL_CREAM | Freq: Two times a day (BID) | CUTANEOUS | 5 refills | Status: DC

## 2022-11-23 MED ORDER — CETIRIZINE HCL 10 MG PO TABS: 10.0000 mg | ORAL_TABLET | Freq: Every day | ORAL | 1 refills | Status: DC

## 2022-11-23 MED ORDER — FAMOTIDINE 20 MG PO TABS: 20.0000 mg | ORAL_TABLET | Freq: Two times a day (BID) | ORAL | 1 refills | Status: DC

## 2022-11-23 MED ORDER — MONTELUKAST SODIUM 10 MG PO TABS: 10.0000 mg | ORAL_TABLET | Freq: Every day | ORAL | 1 refills | Status: DC

## 2022-11-23 MED ORDER — BUDESONIDE-FORMOTEROL FUMARATE 160-4.5 MCG/ACT IN AERO
2.0000 | INHALATION_SPRAY | Freq: Two times a day (BID) | RESPIRATORY_TRACT | 5 refills | Status: DC
Start: 2022-11-23 — End: 2023-08-16

## 2022-11-25 ENCOUNTER — Ambulatory Visit: Payer: Medicare HMO | Admitting: Student

## 2022-11-25 NOTE — Progress Notes (Deleted)
    SUBJECTIVE:   CHIEF COMPLAINT / HPI:   ***  PERTINENT  PMH / PSH: ***  OBJECTIVE:   There were no vitals taken for this visit.  ***  ASSESSMENT/PLAN:   No problem-specific Assessment & Plan notes found for this encounter.     Mayuri Jagadish, MD  [facility]  

## 2022-12-02 ENCOUNTER — Ambulatory Visit: Payer: Medicare HMO | Admitting: Student

## 2022-12-02 ENCOUNTER — Telehealth: Payer: Self-pay | Admitting: Allergy & Immunology

## 2022-12-02 NOTE — Telephone Encounter (Signed)
Patient would like a call back on information of her previous allergy test. She is planning to purchase a dog, but beofre purchase she wants to know if she is allergic. Please call back @ [phone].

## 2022-12-02 NOTE — Telephone Encounter (Signed)
Found out records on [address] went over old testing and yes she is allergic to dog and cat pt stated understanding and at this time her roommate wants a dog

## 2022-12-06 NOTE — Progress Notes (Signed)
SUBJECTIVE:   Chief compliant/HPI: annual examination  Tiffany Velasquez is a 27 y.o. who presents today for an annual exam.   Morbid obesity Interested in bariatric surgery  Health maintenance Scared of getting pap smear, states she has never been sexually active  Iritis  retinal edema  steroid-induced glaucoma  panuveitis Significant eye history.  On multiple medications for this.  Follows with ophthalmology monthly to every few weeks.  Currently on Humira and IVIG injections that she stated started around December now through a PICC line that was inserted in March. Got her IVIG injection yesterday.  Allergies  asthma Just got a dog.  States that it is hypoallergenic and that she is doing well with this so far.  Seen by allergy specialist frequently.  FH- Dad CHF, Grandfather 2 bypass died at 70s--none before age of 5, denies any family history of breast cancer, denies any family history of colon cancer.  Updated history tabs and problem list-moderate persistent asthma.   OBJECTIVE:   BP (!) 148/103   Pulse 92   Ht 5\' 9"  (1.753 m)   Wt (!) 436 lb 4 oz (197.9 kg)   LMP 12/04/2022   SpO2 98%   BMI 64.42 kg/m   General: Well appearing, NAD, awake, alert, responsive to questions Head: Normocephalic atraumatic, no neck masses or adenopathy on exam CV: Regular rate and rhythm no murmurs rubs or gallops Respiratory: Clear to ausculation bilaterally, no wheezes rales or crackles, chest rises symmetrically,  no increased work of breathing Abdomen: Soft, non-tender, non-distended, normoactive bowel sounds  Extremities: Moves upper and lower extremities freely, no edema in LE Neuro: No focal deficits Skin: No rashes or lesions visualized   ASSESSMENT/PLAN:   Morbid obesity (HCC) BMI 64 today.  She is interested in gastric sleeve for helping with this which I believe is appropriate. -Bariatric surgery referral -A1c, future -Lipid panel, future  Retinal edema Followed  closely by ophthalmology.  Got IVIG injection yesterday.  She will reach out about safe timing of vaccines such as flu and COVID for her. -Follow-up for vaccine administration  Not well controlled moderate persistent asthma Seems to be pretty controlled currently.  Follows with allergy specialist very closely.  Elevated blood pressure reading Elevated blood pressure reading to 148/103.  Discussed with patient to follow-up with Dr. Hildred Laser for ambulatory blood pressure monitor to see if this is true hypertension. -Discussed weight loss for management for now -Ambulatory blood pressure monitor -Consider starting low-dose antihypertensive if remains elevated on next few visits    Annual Examination  See AVS for age appropriate recommendations.   PHQ score 0, reviewed and discussed.    12/09/2022   10:10 AM 11/18/2022    7:31 PM 08/19/2021   10:28 AM 07/16/2021    1:45 PM 03/16/2021    9:53 AM  Depression screen PHQ 2/9  Decreased Interest 0 0 0 0 0  Down, Depressed, Hopeless 0 0 0 0 0  PHQ - 2 Score 0 0 0 0 0  Altered sleeping 0  0 0 0  Tired, decreased energy 0  0 1 0  Change in appetite 0  0 1 0  Feeling bad or failure about yourself  0  0 0 0  Trouble concentrating 0  0 0 0  Moving slowly or fidgety/restless 0  0 0 0  Suicidal thoughts 0  0 0 0  PHQ-9 Score 0  0 2 0  Difficult doing work/chores   Not difficult at all  Not difficult at all    Blood pressure reviewed and not at [facility] above The patient currently uses abstinence for contraception.   Considered the following items based upon USPSTF recommendations: HIV testing: ordered Hepatitis C: ordered Hepatitis B:  not ordered Syphilis if at high risk:  not ordered GC/CT not at high risk and not ordered. Lipid panel (nonfasting or fasting) discussed based upon AHA recommendations and ordered.  Consider repeat every 4-6 years.   Cervical cancer screening: discussed and declined.  Would like OBGYN referral for pap  smear Immunizations-due for flu vaccine she declines.  Follow up in 1  year or sooner if indicated.    Levin Erp, MD [facility]

## 2022-12-07 NOTE — Telephone Encounter (Signed)
Patient called in - DOB verified - stated she had more questions regarding her dog allergy due to her uncle that no resides with her is planning to get a service dog.  Patient stated she had the avoidance measures, taking medications as directed and receiving allergy injections that contain dog ingredient- wanted to make sure she was doing everything correctly.  Reviewed all of the above with patient - advising she could possibly still react to having a dog in home.  Patient verbalized understanding to all, no further questions.

## 2022-12-09 ENCOUNTER — Encounter: Payer: Self-pay | Admitting: Student

## 2022-12-09 ENCOUNTER — Ambulatory Visit (INDEPENDENT_AMBULATORY_CARE_PROVIDER_SITE_OTHER): Payer: Medicare HMO | Admitting: Student

## 2022-12-09 ENCOUNTER — Ambulatory Visit (INDEPENDENT_AMBULATORY_CARE_PROVIDER_SITE_OTHER): Payer: Self-pay | Admitting: *Deleted

## 2022-12-09 DIAGNOSIS — Z01411 Encounter for gynecological examination (general) (routine) with abnormal findings: Secondary | ICD-10-CM | POA: Diagnosis not present

## 2022-12-09 DIAGNOSIS — Z116 Encounter for screening for other protozoal diseases and helminthiases: Secondary | ICD-10-CM | POA: Diagnosis not present

## 2022-12-09 DIAGNOSIS — H3581 Retinal edema: Secondary | ICD-10-CM

## 2022-12-09 DIAGNOSIS — Z01419 Encounter for gynecological examination (general) (routine) without abnormal findings: Secondary | ICD-10-CM | POA: Diagnosis not present

## 2022-12-09 DIAGNOSIS — R030 Elevated blood-pressure reading, without diagnosis of hypertension: Secondary | ICD-10-CM | POA: Diagnosis not present

## 2022-12-09 DIAGNOSIS — J4540 Moderate persistent asthma, uncomplicated: Secondary | ICD-10-CM

## 2022-12-09 DIAGNOSIS — Z113 Encounter for screening for infections with a predominantly sexual mode of transmission: Secondary | ICD-10-CM | POA: Diagnosis not present

## 2022-12-09 DIAGNOSIS — J309 Allergic rhinitis, unspecified: Secondary | ICD-10-CM | POA: Diagnosis not present

## 2022-12-09 NOTE — Assessment & Plan Note (Signed)
Seems to be pretty controlled currently.  Follows with allergy specialist very closely.

## 2022-12-09 NOTE — Patient Instructions (Signed)
It was great to see you! Thank you for allowing me to participate in your care!   I recommend that you always bring your medications to each appointment as this makes it easy to ensure we are on the correct medications and helps [place] not miss when refills are needed.  Our plans for today:  -Please schedule lab appointment for the labs that have ordered for future-please do it in the morning fasting so that we can get an accurate cholesterol lab level -I have referred you to bariatric surgery and OB/GYN  Take care and seek immediate care sooner if you develop any concerns. Please remember to show up 15 minutes before your scheduled appointment time!  Levin Erp, MD [facility]

## 2022-12-09 NOTE — Assessment & Plan Note (Signed)
Elevated blood pressure reading to 148/103.  Discussed with patient to follow-up with Dr. Hildred Laser for ambulatory blood pressure monitor to see if this is true hypertension. -Discussed weight loss for management for now -Ambulatory blood pressure monitor -Consider starting low-dose antihypertensive if remains elevated on next few visits

## 2022-12-09 NOTE — Assessment & Plan Note (Addendum)
BMI 64 today.  She is interested in gastric sleeve for helping with this which I believe is appropriate. -Bariatric surgery referral -A1c, future -Lipid panel, future

## 2022-12-09 NOTE — Assessment & Plan Note (Signed)
Followed closely by ophthalmology.  Got IVIG injection yesterday.  She will reach out about safe timing of vaccines such as flu and COVID for her. -Follow-up for vaccine administration

## 2022-12-13 DIAGNOSIS — J301 Allergic rhinitis due to pollen: Secondary | ICD-10-CM | POA: Diagnosis not present

## 2022-12-13 NOTE — Progress Notes (Signed)
VIALS EXP 12-13-23

## 2022-12-13 NOTE — Patient Instructions (Incomplete)
1. Moderate persistent asthma - controlled - Daily controller medication(s): Symbicort 160/4.5 two puffs twice daily with spacer - Prior to physical activity: albuterol 2 puffs 10-15 minutes before physical activity. - Rescue medications: albuterol 4 puffs every 4-6 hours as needed and albuterol nebulizer one vial every 4-6 hours as needed - Asthma control goals:  * Full participation in all desired activities (may need albuterol before activity) * Albuterol use two time or less a week on average (not counting use with activity) * Cough interfering with sleep two time or less a month * Oral steroids no more than once a year * No hospitalizations  2. Chronic allergic rhinitis - .  -She would like to get lab work to follow up on her allergies rather than skin testing. We will call you with results once they are back - Continue with Singulair, cetirizine, ipratropium, and Astelin.   - Continue with allergy shots at the same schedule.   3. Atopic dermatitis - Continue with moisturizing twice daily. - Continue with [employer].   - Continue with your medicated ointments as needed.   4. Multiple food allergies (peanuts, shellfish, multiple fruits) - EpiPen is up to date.  - Continue to avoid peanuts, tree nuts, and shellfish.   -We will get lab work to follow-up on your food allergies.  We will call you with results once they are back.  Your blood pressure was elevated today while in the office.  Please schedule an appointment to discuss with your primary care physician  5.keep already scheduled follow-up appointment on January 05, 2023 at 2:30 PM with Dr. Dellis Anes or sooner if needed

## 2022-12-14 ENCOUNTER — Encounter: Payer: Self-pay | Admitting: Family

## 2022-12-14 ENCOUNTER — Other Ambulatory Visit: Payer: Self-pay

## 2022-12-14 ENCOUNTER — Ambulatory Visit (INDEPENDENT_AMBULATORY_CARE_PROVIDER_SITE_OTHER): Payer: Medicare HMO | Admitting: Family

## 2022-12-14 VITALS — BP 130/90 | HR 110 | Temp 98.2°F | Resp 16 | Wt >= 6400 oz

## 2022-12-14 DIAGNOSIS — J3089 Other allergic rhinitis: Secondary | ICD-10-CM

## 2022-12-14 DIAGNOSIS — J302 Other seasonal allergic rhinitis: Secondary | ICD-10-CM

## 2022-12-14 DIAGNOSIS — J4540 Moderate persistent asthma, uncomplicated: Secondary | ICD-10-CM | POA: Diagnosis not present

## 2022-12-14 DIAGNOSIS — L2084 Intrinsic (allergic) eczema: Secondary | ICD-10-CM | POA: Diagnosis not present

## 2022-12-14 DIAGNOSIS — T7800XD Anaphylactic reaction due to unspecified food, subsequent encounter: Secondary | ICD-10-CM | POA: Diagnosis not present

## 2022-12-14 NOTE — Progress Notes (Signed)
[address] [place] [address] Dept: [phone]  FOLLOW UP NOTE  Patient ID: Tiffany Velasquez, female    DOB: 09/21/1995  Age: 27 y.o. MRN: [id number] Date of Office Visit: 12/14/2022  Assessment  Chief Complaint: Allergy Testing (retest)  HPI Tiffany Velasquez is a 27 year old female who presents today for skin testing to environmental allergens and select foods.  She was last seen on November 22, 2022 by Dr. 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  She denies any new diagnosis or surgeries, but mentions that she is considering gastric sleeve surgery.  Moderate persistent asthma is reported as breathing better since changing to Symbicort.  She also has placed an air purifier in her room and feels like this has helped her asthma.  She denies cough, wheeze, tightness in chest, and nocturnal awakenings due to breathing problems.  She also mentions that her shortness of breath is not as bad as what it previously was.  Since her last office visit she has not made any trips to the emergency room or urgent care due to breathing problems.  She did finish the round of steroids that Dr. Dellis Anes gave her at her last office visit and has not received any more.  She feels like the nebulizer machine that she was given at the last office visit has helped.  The last time she has needed albuterol was 2 weeks ago.  She continues to take Symbicort 160/4.5 mcg 2 puffs twice a day with a spacer.  Allergic rhinitis: She reports nasal congestion in the morning and is fine the rest of the day.  She denies rhinorrhea and postnasal drip.  She has not been treated for any sinus infections since we last saw her.  She continues to take  Singulair 10 mg once a day, cetirizine 10 mg once a day, ipratropium bromide nasal spray as needed, Flonase nasal spray as needed, and Astelin nasal spray as needed.  She also continues to receive allergy injections per protocol.  She denies any problems or reactions.  She does feel like her allergy injections really helped.  She feels like she has had the best summer due to not really having any problems with her allergies.  She does mention if she forgets to take her cetirizine before getting her allergy injections the area will bump up, but if she takes her cetirizine she does not have any bumps.  She reports that she would like to do lab work today rather than skin testing due to not having a family member help fan her during the skin testing.  Atopic dermatitis is reported as good.  She recently saw Dr. Lovenia Kim.  She continues to use her medicated ointments as needed.  Multiple food allergies: She continues to avoid peanuts, tree nuts, shellfish, apples, oranges, peaches, and tomatoes without any accidental ingestion or use of her epinephrine autoinjector device.  She reports that with apples, oranges, peaches and tomatoes it causes reddish bumps.  She reports anaphylactic reaction with shellfish, peanuts, and tree nuts.   Drug Allergies:  Allergies  Allergen Reactions   Peanut-Containing Drug Products Swelling    Throat swelling   Shellfish Allergy Swelling    Throat swelling    Apple Juice    Orange Fruit [Citrus]    [employer]  Tomato    Tree Extract     ALLERGIC TO TREE NUTS    Review of Systems: Negative except as per HPI   Physical Exam: BP (!) 130/90   Pulse (!) 110   Temp 98.2 F (36.8 C) (Temporal)   Resp 16   Wt (!) 433 lb 3.2 oz (196.5 kg)   LMP 12/04/2022   SpO2 97%   BMI 63.97 kg/m    Physical Exam Constitutional:      Appearance: Normal appearance.  HENT:     Head: Normocephalic and atraumatic.     Comments: Pharynx normal, eyes normal, ears:  Unable to see right tympanic membrane due to cerumen.  Left ear normal, nose: Bilateral lower turbinates moderately edematous and slightly erythematous with no drainage noted    Right Ear: Ear canal and external ear normal.     Left Ear: Tympanic membrane, ear canal and external ear normal.     Mouth/Throat:     Mouth: Mucous membranes are moist.     Pharynx: Oropharynx is clear.  Eyes:     Conjunctiva/sclera: Conjunctivae normal.  Cardiovascular:     Rate and Rhythm: Regular rhythm.     Heart sounds: Normal heart sounds.  Pulmonary:     Effort: Pulmonary effort is normal.     Breath sounds: Normal breath sounds.     Comments: Lungs clear to auscultation Musculoskeletal:     Cervical back: Neck supple.  Skin:    General: Skin is warm.  Neurological:     Mental Status: She is alert and oriented to person, place, and time.  Psychiatric:        Mood and Affect: Mood normal.        Behavior: Behavior normal.        Thought Content: Thought content normal.        Judgment: Judgment normal.     Diagnostics:  None. Will get spirometry at next office visit  Assessment and Plan: 1. Moderate persistent asthma without complication   2. Seasonal and perennial allergic rhinitis   3. Anaphylactic shock due to food, subsequent encounter   4. Intrinsic atopic dermatitis     No orders of the defined types were placed in this encounter.   Patient Instructions  1. Moderate persistent asthma - controlled - Daily controller medication(s): Symbicort 160/4.5 two puffs twice daily with spacer - Prior to physical activity: albuterol 2 puffs 10-15 minutes before physical activity. - Rescue medications: albuterol 4 puffs every 4-6 hours as needed and albuterol nebulizer one vial every 4-6 hours as needed - Asthma control goals:  * Full participation in all desired activities (may need albuterol before activity) * Albuterol use two time or less a week on average (not counting use with  activity) * Cough interfering with sleep two time or less a month * Oral steroids no more than once a year * No hospitalizations  2. Chronic allergic rhinitis - .  -She would like to get lab work to follow up on her allergies rather than skin testing. We will call you with results once they are back - Continue with Singulair, cetirizine, ipratropium, and Astelin.   - Continue with allergy shots at the same schedule.   3. Atopic dermatitis - Continue with moisturizing twice daily. - Continue with [employer].   - Continue with your medicated ointments as needed.   4. Multiple food allergies (peanuts, shellfish, multiple fruits) - EpiPen is up to date.  - Continue to avoid peanuts,  tree nuts, and shellfish.   -We will get lab work to follow-up on your food allergies.  We will call you with results once they are back.  Your blood pressure was elevated today while in the office.  Please schedule an appointment to discuss with your primary care physician  5.keep already scheduled follow-up appointment on January 05, 2023 at 2:30 PM with Dr. Dellis Anes or sooner if needed     Return in about 22 days (around 01/05/2023), or if symptoms worsen or fail to improve.    Thank you for the opportunity to care for this patient.  Please do not hesitate to contact me with questions.  Nehemiah Settle, FNP [facility]

## 2022-12-15 DIAGNOSIS — J3081 Allergic rhinitis due to animal (cat) (dog) hair and dander: Secondary | ICD-10-CM

## 2022-12-16 LAB — IGE NUT PROF. W/COMPONENT RFLX

## 2022-12-18 LAB — ALLERGEN PROFILE, SHELLFISH
Clam IgE: 2.06 kU/L — AB
F023-IgE Crab: 5.95 kU/L — AB
F080-IgE Lobster: 7.95 kU/L — AB
F290-IgE Oyster: 1.11 kU/L — AB
Scallop IgE: 4.21 kU/L — AB
Shrimp IgE: 10.2 kU/L — AB

## 2022-12-18 LAB — PANEL 604726
Cor A 1 IgE: 1.3 kU/L — AB
Cor A 14 IgE: <0.1 kU/L
Cor A 8 IgE: <0.1 kU/L
Cor A 9 IgE: <0.1 kU/L

## 2022-12-18 LAB — ALLERGENS W/TOTAL IGE AREA 2
Alternaria Alternata IgE: <0.1 kU/L
Aspergillus Fumigatus IgE: <0.1 kU/L
Bermuda Grass IgE: 0.19 kU/L — AB
Cat Dander IgE: 0.83 kU/L — AB
Cedar, Mountain IgE: 0.81 kU/L — AB
Cladosporium Herbarum IgE: <0.1 kU/L
Cockroach, German IgE: 9.28 kU/L — AB
Common Silver Birch IgE: 1.76 kU/L — AB
Cottonwood IgE: 0.41 kU/L — AB
D Farinae IgE: 56.3 kU/L — AB
D Pteronyssinus IgE: 54.6 kU/L — AB
Dog Dander IgE: 0.54 kU/L — AB
Elm, American IgE: 0.42 kU/L — AB
IgE (Immunoglobulin E), Serum: 748 IU/mL — ABNORMAL HIGH (ref 6–495)
Johnson Grass IgE: 0.22 kU/L — AB
Maple/Box Elder IgE: 0.42 kU/L — AB
Mouse Urine IgE: <0.1 kU/L
Oak, White IgE: 5.77 kU/L — AB
Pecan, Hickory IgE: 0.84 kU/L — AB
Penicillium Chrysogen IgE: <0.1 kU/L
Pigweed, Rough IgE: 0.18 kU/L — AB
Ragweed, Short IgE: 0.22 kU/L — AB
Sheep Sorrel IgE Qn: 0.15 kU/L — AB
Timothy Grass IgE: 1.94 kU/L — AB
White Mulberry IgE: 0.22 kU/L — AB

## 2022-12-18 LAB — ALLERGEN, TOMATO F25: Allergen Tomato, IgE: 0.19 kU/L — AB

## 2022-12-18 LAB — IGE NUT PROF. W/COMPONENT RFLX
F017-IgE Hazelnut (Filbert): 1.2 kU/L — AB
F018-IgE Brazil Nut: <0.1 kU/L
F203-IgE Pistachio Nut: 0.41 kU/L — AB
F256-IgE Walnut: <0.1 kU/L
Macadamia Nut, IgE: 0.28 kU/L — AB
Peanut, IgE: 0.11 kU/L — AB
Peanut, IgE: 0.24 kU/L — AB

## 2022-12-18 LAB — ALLERGEN PEACH F95: Allergen, Peach f95: 0.2 kU/L — AB

## 2022-12-18 LAB — ALLERGEN, ORANGE F33: Orange: 0.18 kU/L — AB

## 2022-12-18 LAB — PEANUT COMPONENTS
F352-IgE Ara h 8: 0.72 kU/L — AB
F422-IgE Ara h 1: <0.1 kU/L
F423-IgE Ara h 2: <0.1 kU/L
F424-IgE Ara h 3: <0.1 kU/L
F427-IgE Ara h 9: <0.1 kU/L
F447-IgE Ara h 6: <0.1 kU/L

## 2022-12-18 LAB — ALLERGEN, APPLE F49: Allergen Apple, IgE: 0.34 kU/L — AB

## 2022-12-18 LAB — ALLERGEN COMPONENT COMMENTS

## 2022-12-19 ENCOUNTER — Encounter: Payer: Self-pay | Admitting: Pharmacist

## 2022-12-19 ENCOUNTER — Other Ambulatory Visit: Payer: Medicare HMO

## 2022-12-19 ENCOUNTER — Ambulatory Visit (INDEPENDENT_AMBULATORY_CARE_PROVIDER_SITE_OTHER): Payer: Medicare HMO | Admitting: Pharmacist

## 2022-12-19 VITALS — BP 164/119 | Ht 69.0 in | Wt >= 6400 oz

## 2022-12-19 DIAGNOSIS — R030 Elevated blood-pressure reading, without diagnosis of hypertension: Secondary | ICD-10-CM

## 2022-12-19 DIAGNOSIS — Z113 Encounter for screening for infections with a predominantly sexual mode of transmission: Secondary | ICD-10-CM

## 2022-12-19 NOTE — Patient Instructions (Signed)

## 2022-12-19 NOTE — Progress Notes (Signed)
   S:     Chief Complaint  Patient presents with   Medication Management    Blood Pressure Check   27 y.o. female who presents for hypertension evaluation, education, and management. Patient arrives in good good spirits and presents without any assistance.  PMH is significant for asthma and allergic rhinitis.   Patient reports 4-day infusions at the start of every month for retinal edema. At this month's infusion, patient reports also being given prednisone to help reduce additional inflammation causing a delay in next months infusion. Patient reports next infusion will be ~ October 15th. Patient reports occasionally being told her diastolic blood pressure was ~ 90 mm Hg, but states her eye doctor said that was normal throughout the days of infusions.  Patient was referred and last seen by Primary Care Provider, Dr. Laroy Apple, on 12/09/22.  At last visit with Dr. Laroy Apple, discussed weight loss for management of HTN and potentially starting low dose antihypertensives if persistently elevated at following visits.  No diagnosis of hypertension to this date.  Medication compliance is reported to be good.  Discussed procedure for wearing the monitor and gave patient written instructions. Monitor was placed on non-dominant arm with instructions to return in the morning.   Current BP Medications include:  none  Antihypertensives tried in the past include: none  O:  Review of Systems  All other systems reviewed and are negative.   Physical Exam Pulmonary:     Effort: Pulmonary effort is normal.  Neurological:     Mental Status: She is alert.  Psychiatric:        Mood and Affect: Mood normal.        Behavior: Behavior normal.        Thought Content: Thought content normal.        Judgment: Judgment normal.     Last 3 Office BP readings: BP Readings from Last 3 Encounters:  12/14/22 (!) 130/90  12/09/22 (!) 148/103  11/22/22 124/86    Clinical Atherosclerotic Cardiovascular  Disease (ASCVD): No   Basic Metabolic Panel    Component Value Date/Time   NA 136 05/09/2019 1150   K 4.6 05/09/2019 1150   CL 108 05/09/2019 1150   CO2 21 (L) 05/09/2019 1150   GLUCOSE 92 05/09/2019 1150   BUN 10 05/09/2019 1150   CREATININE 0.81 05/09/2019 1150   CALCIUM 8.8 (L) 05/09/2019 1150   GFRNONAA >60 05/09/2019 1150   GFRAA >60 05/09/2019 1150    Renal function: CrCl cannot be calculated (Patient's most recent lab result is older than the maximum 21 days allowed.).   ABPM Study Data: Arm Placement left arm  For Office Goal BP of <130/80 mmHg:  ABPM thresholds: Overall BP <125/75 mmHg, daytime BP <130/80 mmHg, sleeptime BP <110/65 mmHg    A/P: History of elevated BP readings including throughout monthly infusions for retinal edema, not currently taking any BP-lowering medications; with goal presssure of <130/80 mm Hg.    -Placed blood pressure cuff, provided education, patient instructed to wear cuff for 24 hours and return tomorrow to review results.   Written patient instructions provided including activity/symptom/event log. Patient verbalized understanding of plan. Total time in face to face counseling 24 minutes.    Follow-up: Tomorrow AM - early morning appointment 8:30 a.m.  Patient seen with Andee Poles, PharmD Candidate.

## 2022-12-19 NOTE — Progress Notes (Signed)
Reviewed and agree with Dr Koval's plan.   

## 2022-12-19 NOTE — Assessment & Plan Note (Signed)
History of elevated BP readings including throughout monthly infusions for retinal edema, not currently taking any BP-lowering medications; with goal presssure of <130/80 mm Hg.    -Placed blood pressure cuff, provided education, patient instructed to wear cuff for 24 hours and return tomorrow to review results.

## 2022-12-20 ENCOUNTER — Telehealth: Payer: Self-pay | Admitting: Student

## 2022-12-20 ENCOUNTER — Ambulatory Visit (INDEPENDENT_AMBULATORY_CARE_PROVIDER_SITE_OTHER): Payer: Medicare HMO | Admitting: Pharmacist

## 2022-12-20 ENCOUNTER — Encounter: Payer: Self-pay | Admitting: Pharmacist

## 2022-12-20 VITALS — BP 135/91 | HR 88

## 2022-12-20 DIAGNOSIS — R030 Elevated blood-pressure reading, without diagnosis of hypertension: Secondary | ICD-10-CM

## 2022-12-20 DIAGNOSIS — I10 Essential (primary) hypertension: Secondary | ICD-10-CM | POA: Diagnosis not present

## 2022-12-20 LAB — LIPID PANEL
Chol/HDL Ratio: 5.2 ratio — ABNORMAL HIGH (ref 0.0–4.4)
Cholesterol, Total: 176 mg/dL (ref 100–199)
HDL: 34 mg/dL — ABNORMAL LOW (ref >39)
LDL Chol Calc (NIH): 110 mg/dL — ABNORMAL HIGH (ref 0–99)
Triglycerides: 182 mg/dL — ABNORMAL HIGH (ref 0–149)
VLDL Cholesterol Cal: 32 mg/dL (ref 5–40)

## 2022-12-20 LAB — HEMOGLOBIN A1C
Est. average glucose Bld gHb Est-mCnc: 117 mg/dL
Hgb A1c MFr Bld: 5.7 % — ABNORMAL HIGH (ref 4.8–5.6)

## 2022-12-20 LAB — HCV AB W REFLEX TO QUANT PCR: HCV Ab: NONREACTIVE

## 2022-12-20 LAB — HCV INTERPRETATION

## 2022-12-20 LAB — HIV ANTIBODY (ROUTINE TESTING W REFLEX): HIV Screen 4th Generation wRfx: NONREACTIVE

## 2022-12-20 MED ORDER — AMLODIPINE BESYLATE 5 MG PO TABS
5.0000 mg | ORAL_TABLET | Freq: Every day | ORAL | 1 refills | Status: DC
Start: 2022-12-20 — End: 2023-05-22

## 2022-12-20 NOTE — Assessment & Plan Note (Signed)
History of elevated blood pressure readings in office and worsened with ocular infusion therapy.  Currently not taking and blood pressure altering therapy. Goal presssure of <130/80. Found to have persistently elevated and uncontrolled blood pressure with 24-hour ambulatory blood pressure evaluation which demonstrates an average AWAKE blood pressure of 135/91 mmHg.  Nocturnal dipping pattern is normal with > 20% reduction while asleep. Changes to medications -START amlodipine 5mg  daily.  Patient educated on purpose, proper use and potential adverse effects of peripheral edema.  Following instruction patient verbalized understanding of treatment plan.

## 2022-12-20 NOTE — Telephone Encounter (Signed)
Called patient and confirmed DOB. Discussed A1c is in the prediabetes range.  Also discussed that LDL is elevated and HDL is below goal.  We discussed nutrition and ways to help with these numbers.  She is also getting get scheduled for gastric sleeve evaluation as well.  HIV and hepatitis C within normals.

## 2022-12-20 NOTE — Progress Notes (Signed)
   S:     Chief Complaint  Patient presents with   Medication Management    Ambulatory BP Management - Day 2   27 y.o. female who presents for hypertension evaluation, education, and management.  Patient arrives in good spirits and presents without any assistance.   PMH is significant for asthma and allergy.  Patient returns to clinic with 24 hour blood pressure monitor and reports no issues with device, reports sleeping soundly. Patient reports they were able to wear the Ambulatory Blood Pressure Cuff for the entire 24 evaluation period.   O:  Review of Systems  All other systems reviewed and are negative.   Physical Exam Vitals reviewed.  Pulmonary:     Effort: Pulmonary effort is normal.     Breath sounds: Rhonchi present.  Neurological:     Mental Status: She is alert.  Psychiatric:        Mood and Affect: Mood normal.        Behavior: Behavior normal.        Thought Content: Thought content normal.     Last 3 Office BP readings: BP Readings from Last 3 Encounters:  12/20/22 (!) 135/91  12/19/22 (!) 164/119  12/14/22 (!) 130/90    Clinical Atherosclerotic Cardiovascular Disease (ASCVD): No  The ASCVD Risk score (Arnett DK, et al., 2019) failed to calculate for the following reasons:   The 2019 ASCVD risk score is only valid for ages 40 to 12  Basic Metabolic Panel    Component Value Date/Time   NA 136 05/09/2019 1150   K 4.6 05/09/2019 1150   CL 108 05/09/2019 1150   CO2 21 (L) 05/09/2019 1150   GLUCOSE 92 05/09/2019 1150   BUN 10 05/09/2019 1150   CREATININE 0.81 05/09/2019 1150   CALCIUM 8.8 (L) 05/09/2019 1150   GFRNONAA >60 05/09/2019 1150   GFRAA >60 05/09/2019 1150    Renal function: CrCl cannot be calculated (Patient's most recent lab result is older than the maximum 21 days allowed.).   ABPM Study Data: Arm Placement left wrist  Overall Mean 24hr BP:   121/79 mmHg  HR: 84  Daytime Mean BP:  135/91 mmHg  HR: 88  Nighttime Mean BP:  95/58  mmHg  HR: 77  Dipping Pattern: Yes.    Sys:   29.5%   Dia: 36.4%   [normal dipping ~10-20%]   For Office Goal BP of <130/80 mmHg:  ABPM thresholds: Overall BP <125/75 mmHg, daytime BP <130/80 mmHg, sleeptime BP <110/65 mmHg    A/P: History of elevated blood pressure readings in office and worsened with ocular infusion therapy.  Currently not taking and blood pressure altering therapy. Goal presssure of <130/80. Found to have persistently elevated and uncontrolled blood pressure with 24-hour ambulatory blood pressure evaluation which demonstrates an average AWAKE blood pressure of 135/91 mmHg.  Nocturnal dipping pattern is normal with > 20% reduction while asleep. Changes to medications -START amlodipine 5mg  daily.  Patient educated on purpose, proper use and potential adverse effects of peripheral edema.  Following instruction patient verbalized understanding of treatment plan.   Results reviewed and written information provided.    Written patient instructions provided. Patient verbalized understanding of treatment plan.  Total time in face to face counseling 22 minutes.    Follow-up:  Pharmacist 10/29 for blood pressure recheck.  PCP clinic visit in PRN

## 2022-12-20 NOTE — Patient Instructions (Signed)
It was nice to see you today!  Thank you for completing the blood pressure monitoring evaluation.  Your goal blood pressure is < 130/80 mmHg   Medication Changes: START Amlodipine 5mg  daily  Continue all other medication the same.   Monitor blood pressure at home and keep a log (on a piece of paper) to bring with you to your next visit.

## 2022-12-21 NOTE — Progress Notes (Signed)
Please let Saundria know that her lab work came back.   Her environmental allergies were: High to dust mite, cockroach grass, and tree.  Moderate to cat.  Dog, weed pollen, and ragweed were low.  Continue allergy injections per protocol as she is doing well with these.  Continue avoidance measures.  Lab work to shellfish is high.  Continue to avoid shellfish and have access to her epinephrine autoinjector device at all times.  Peanuts and tree nuts were low enough that we could consider 2 separate in office oral food challenges to peanuts and mixed tree nuts.  We would do skin testing to peanuts and tree nuts prior to starting these challenges.  These appointments last 2 to 3 hours each.  She would need to be off all antihistamines 3 days prior to this appointment and in good health.  (No recent antibiotics or vaccines in the past 7 days.).  She would need to bring peanut butter with her to the day of the challenge.  We have mixed tree nut butter in our office.  If she is not interested in completing these in office oral food challenges she needs to continue to avoid peanuts and tree nuts and have access to her epinephrine auto injector device at all times.  Apple, orange, peach, and tomato are also low.  We could do 4 separate in office oral food challenges to these foods.  She would need to bring 1 apple, 1 orange, 1 peach, and 1 tomato with her the days of her challenges.  These separate appointments would each last 2 to 3 hours and she would need to be off all antihistamines 3 days prior to each appointment.  She would also need to be in good health.  (No recent antibiotics or vaccines.)If she is not interested in completing any separate food challenges she needs to continue to avoid these foods and have access to her epinephrine autoinjector device.

## 2022-12-21 NOTE — Progress Notes (Signed)
Reviewed and agree with Dr Koval's plan.   

## 2022-12-27 ENCOUNTER — Ambulatory Visit: Payer: Medicare HMO

## 2022-12-27 DIAGNOSIS — Z23 Encounter for immunization: Secondary | ICD-10-CM | POA: Diagnosis not present

## 2022-12-27 NOTE — Progress Notes (Signed)
Patient presents to nurse clinic for Flu vaccine.  She reports she gets the Flu vaccine every year with no interactions.  Vaccine administered without complication.  See admin for details.

## 2022-12-30 ENCOUNTER — Ambulatory Visit (INDEPENDENT_AMBULATORY_CARE_PROVIDER_SITE_OTHER): Payer: Medicare HMO | Admitting: *Deleted

## 2022-12-30 DIAGNOSIS — J309 Allergic rhinitis, unspecified: Secondary | ICD-10-CM

## 2023-01-05 ENCOUNTER — Ambulatory Visit: Payer: Medicare HMO | Admitting: Allergy & Immunology

## 2023-01-06 ENCOUNTER — Telehealth: Payer: Self-pay | Admitting: Student

## 2023-01-06 NOTE — Telephone Encounter (Signed)
Patient dropped off form at front desk for [facility].  Verified that patient section of form has been completed.  Last DOS/WCC with PCP was 12/27/22.  Placed form in blue team folder to be completed by clinical staff.  Vilinda Blanks

## 2023-01-09 NOTE — Telephone Encounter (Signed)
Clinical info completed on [facility] form.  Placed form in PCP's box for completion.    When form is completed, please route note to "RN Team" and place in wall pocket in front office.   Aquilla Solian, CMA

## 2023-01-16 DIAGNOSIS — H30033 Focal chorioretinal inflammation, peripheral, bilateral: Secondary | ICD-10-CM | POA: Diagnosis not present

## 2023-01-17 DIAGNOSIS — H40043 Steroid responder, bilateral: Secondary | ICD-10-CM | POA: Diagnosis not present

## 2023-01-17 DIAGNOSIS — H209 Unspecified iridocyclitis: Secondary | ICD-10-CM | POA: Diagnosis not present

## 2023-01-17 DIAGNOSIS — H3581 Retinal edema: Secondary | ICD-10-CM | POA: Diagnosis not present

## 2023-01-17 DIAGNOSIS — H4043X4 Glaucoma secondary to eye inflammation, bilateral, indeterminate stage: Secondary | ICD-10-CM | POA: Diagnosis not present

## 2023-01-17 DIAGNOSIS — Z79899 Other long term (current) drug therapy: Secondary | ICD-10-CM | POA: Diagnosis not present

## 2023-01-17 DIAGNOSIS — H44113 Panuveitis, bilateral: Secondary | ICD-10-CM | POA: Diagnosis not present

## 2023-01-17 DIAGNOSIS — Z961 Presence of intraocular lens: Secondary | ICD-10-CM | POA: Diagnosis not present

## 2023-01-17 DIAGNOSIS — H30033 Focal chorioretinal inflammation, peripheral, bilateral: Secondary | ICD-10-CM | POA: Diagnosis not present

## 2023-01-18 ENCOUNTER — Encounter: Payer: Medicare HMO | Admitting: Family

## 2023-01-19 DIAGNOSIS — Z01810 Encounter for preprocedural cardiovascular examination: Secondary | ICD-10-CM | POA: Diagnosis not present

## 2023-01-19 DIAGNOSIS — Z713 Dietary counseling and surveillance: Secondary | ICD-10-CM | POA: Diagnosis not present

## 2023-01-19 DIAGNOSIS — Z131 Encounter for screening for diabetes mellitus: Secondary | ICD-10-CM | POA: Diagnosis not present

## 2023-01-19 DIAGNOSIS — Z7189 Other specified counseling: Secondary | ICD-10-CM | POA: Diagnosis not present

## 2023-01-19 DIAGNOSIS — Z1329 Encounter for screening for other suspected endocrine disorder: Secondary | ICD-10-CM | POA: Diagnosis not present

## 2023-01-19 DIAGNOSIS — I10 Essential (primary) hypertension: Secondary | ICD-10-CM | POA: Diagnosis not present

## 2023-01-19 DIAGNOSIS — Z13220 Encounter for screening for lipoid disorders: Secondary | ICD-10-CM | POA: Diagnosis not present

## 2023-01-19 DIAGNOSIS — Z1321 Encounter for screening for nutritional disorder: Secondary | ICD-10-CM | POA: Diagnosis not present

## 2023-01-19 DIAGNOSIS — Z01818 Encounter for other preprocedural examination: Secondary | ICD-10-CM | POA: Diagnosis not present

## 2023-01-19 NOTE — Telephone Encounter (Signed)
Patient calls nurse line to check status of paperwork for bariatric surgery.   Please advise.   Veronda Prude, RN

## 2023-01-23 ENCOUNTER — Ambulatory Visit: Payer: Self-pay | Admitting: Surgery

## 2023-01-23 NOTE — Telephone Encounter (Signed)
Form placed up front for pick up.   Copy made for batch scanning.   Attempted to notify patient, however no answer.   Please let her know the form is up front if/when she calls back.

## 2023-01-24 ENCOUNTER — Ambulatory Visit (INDEPENDENT_AMBULATORY_CARE_PROVIDER_SITE_OTHER): Payer: Medicare HMO | Admitting: Pharmacist

## 2023-01-24 ENCOUNTER — Encounter: Payer: Self-pay | Admitting: Pharmacist

## 2023-01-24 ENCOUNTER — Ambulatory Visit (INDEPENDENT_AMBULATORY_CARE_PROVIDER_SITE_OTHER): Payer: Self-pay | Admitting: *Deleted

## 2023-01-24 VITALS — BP 120/87 | HR 83 | Wt >= 6400 oz

## 2023-01-24 DIAGNOSIS — J309 Allergic rhinitis, unspecified: Secondary | ICD-10-CM

## 2023-01-24 DIAGNOSIS — I10 Essential (primary) hypertension: Secondary | ICD-10-CM | POA: Diagnosis not present

## 2023-01-24 NOTE — Assessment & Plan Note (Signed)
Hypertension diagnosed in 2014 currently controlled on current medication. BP goal < 130/80 mmHg. Medication adherence appears good, takes her medications first thing in the morning. Patient is having gastric bypass surgery in June 2025 and has lost ~15 lbs since 12/09/2022 likely helping blood pressure control.   -Continued amlodipine 5 mg once daily -Patient educated on purpose, proper use, and potential adverse effects.  -Counseled on lifestyle modifications for blood pressure control including reduced dietary sodium, increased exercise, adequate sleep. -Discussed dietary changes, such as portion sizes and increasing vegetable intake with reduction of carbohydrate intake, as well as changes to beverage choices from [place] tea to sugar free powder. -Discussed increasing exercise/walking frequency and/or intensity to assist in weight loss.

## 2023-01-24 NOTE — Patient Instructions (Signed)
It was nice to see you today!  Your goal blood pressure is  <130/80  mmHg.  Medication Changes:  Continue all other medication the same.   Monitor blood pressure at home daily and keep a log (on your phone or piece of paper) to bring with you to your next visit. Write down date, time, blood pressure and pulse.  Keep up the good work with diet and exercise. Aim for a diet full of vegetables, fruit and lean meats (chicken, [place], fish). Try to limit salt intake by eating fresh or frozen vegetables (instead of canned), rinse canned vegetables prior to cooking and do not add any additional salt to meals. Aim to increase exercise frequency or intensity.

## 2023-01-24 NOTE — Progress Notes (Signed)
S:     Chief Complaint  Patient presents with   Medication Management    Hypertension - Weight management plan   27 y.o. female who presents for hypertension evaluation, education, and management.  PMH is significant for HTN, Asthma.  Patient was referred and last seen by Primary Care Provider, Dr. Laroy Velasquez, on 12/09/2022.   At last visit, Patient was referred for bariatric surgery to assist with weight loss and BP management. At pharmacy visit on 12/20/2022 following AMB BP monitor, Patient started on amlodipine 5 mg.   Today, patient arrives in good spirits and presents without assistance. Denies dizziness, headache, blurred vision, swelling. Patient will be having a gastric bypass in June, and seeing a nutritionist monthly for 6 months prior to surgery. Reports asthma is very well controlled and only has to use albuterol and arnuity ellipta inhalers with flares that occur with seasonal changes. Has a breathing machine at home that she hasn't used since the beginning of the summer with flare up. Reports nasal drainage during seasonal changes   Patient reports hypertension was diagnosed in 2014.   Medication adherence good . Patient has taken BP medications today.   Current antihypertensives include: amlodipine 5 mg once daily  Antihypertensives tried in the past include: None  Reported home BP readings: 130s/80s-90s 133/83 from [facility] at last consult   Patient reported dietary habits: Working to cut down portion sizes Dinner: baked chicken/fish with broccoli/asparagus and rice/potatoes Drinks: water, working to cut down on [employer] - transition to sugar free flavor packets  Patient-reported exercise habits: walks with her dog  ASCVD risk factors include: HTN, Obesity  O:  Review of Systems  All other systems reviewed and are negative.   Physical Exam Vitals reviewed.  Constitutional:      Appearance: Normal appearance.  Pulmonary:     Effort: Pulmonary  effort is normal.  Neurological:     Mental Status: She is alert.  Psychiatric:        Mood and Affect: Mood normal.        Behavior: Behavior normal.        Thought Content: Thought content normal.        Judgment: Judgment normal.    Last 3 Office BP readings: BP Readings from Last 3 Encounters:  12/20/22 (!) 135/91  12/19/22 (!) 164/119  12/14/22 (!) 130/90    BMET    Component Value Date/Time   NA 136 05/09/2019 1150   K 4.6 05/09/2019 1150   CL 108 05/09/2019 1150   CO2 21 (L) 05/09/2019 1150   GLUCOSE 92 05/09/2019 1150   BUN 10 05/09/2019 1150   CREATININE 0.81 05/09/2019 1150   CALCIUM 8.8 (L) 05/09/2019 1150   GFRNONAA >60 05/09/2019 1150   GFRAA >60 05/09/2019 1150    Renal function: CrCl cannot be calculated (Patient's most recent lab result is older than the maximum 21 days allowed.).  Clinical ASCVD: No  The ASCVD Risk score (Arnett DK, et al., 2019) failed to calculate for the following reasons:   The 2019 ASCVD risk score is only valid for ages 40 to 38  Patient is participating in a Managed Medicaid Plan:  Yes   A/P: Hypertension diagnosed in 2014 currently controlled on current medication. BP goal < 130/80 mmHg. Medication adherence appears good, takes her medications first thing in the morning. Patient is having gastric bypass surgery in June 2025 and has lost ~15 lbs since 12/09/2022 likely helping blood pressure control.   -Continued  amlodipine 5 mg once daily -Patient educated on purpose, proper use, and potential adverse effects.  -Counseled on lifestyle modifications for blood pressure control including reduced dietary sodium, increased exercise, adequate sleep. -Discussed dietary changes, such as portion sizes and increasing vegetable intake with reduction of carbohydrate intake, as well as changes to beverage choices from [place] tea to sugar free powder. -Discussed increasing exercise/walking frequency and/or intensity to assist in weight loss.    Results reviewed and written information provided.    Written patient instructions provided. Patient verbalized understanding of treatment plan.  Total time in face to face counseling 21 minutes.    Follow-up:  Pharmacist PRN. PCP clinic visit in 03/13/2023 with Dr. Laroy Velasquez Patient seen with Shona Simpson, PharmD Candidate.

## 2023-01-25 NOTE — Progress Notes (Signed)
Reviewed and agree with Dr Koval's plan.   

## 2023-01-29 DIAGNOSIS — H30033 Focal chorioretinal inflammation, peripheral, bilateral: Secondary | ICD-10-CM | POA: Diagnosis not present

## 2023-01-30 ENCOUNTER — Ambulatory Visit: Payer: Medicare HMO | Admitting: Dietician

## 2023-01-30 ENCOUNTER — Other Ambulatory Visit: Payer: Self-pay | Admitting: Podiatry

## 2023-02-03 ENCOUNTER — Ambulatory Visit (INDEPENDENT_AMBULATORY_CARE_PROVIDER_SITE_OTHER): Payer: Medicare HMO | Admitting: *Deleted

## 2023-02-03 DIAGNOSIS — J309 Allergic rhinitis, unspecified: Secondary | ICD-10-CM

## 2023-02-07 DIAGNOSIS — H209 Unspecified iridocyclitis: Secondary | ICD-10-CM | POA: Diagnosis not present

## 2023-02-07 DIAGNOSIS — H40043 Steroid responder, bilateral: Secondary | ICD-10-CM | POA: Diagnosis not present

## 2023-02-07 DIAGNOSIS — Z961 Presence of intraocular lens: Secondary | ICD-10-CM | POA: Diagnosis not present

## 2023-02-07 DIAGNOSIS — H30033 Focal chorioretinal inflammation, peripheral, bilateral: Secondary | ICD-10-CM | POA: Diagnosis not present

## 2023-02-07 DIAGNOSIS — Z79899 Other long term (current) drug therapy: Secondary | ICD-10-CM | POA: Diagnosis not present

## 2023-02-07 DIAGNOSIS — H3581 Retinal edema: Secondary | ICD-10-CM | POA: Diagnosis not present

## 2023-02-07 DIAGNOSIS — H44113 Panuveitis, bilateral: Secondary | ICD-10-CM | POA: Diagnosis not present

## 2023-02-07 DIAGNOSIS — H4043X4 Glaucoma secondary to eye inflammation, bilateral, indeterminate stage: Secondary | ICD-10-CM | POA: Diagnosis not present

## 2023-02-10 ENCOUNTER — Ambulatory Visit (INDEPENDENT_AMBULATORY_CARE_PROVIDER_SITE_OTHER): Payer: Medicare HMO | Admitting: *Deleted

## 2023-02-10 DIAGNOSIS — J309 Allergic rhinitis, unspecified: Secondary | ICD-10-CM

## 2023-02-14 ENCOUNTER — Encounter: Payer: Medicare HMO | Attending: Surgery | Admitting: Dietician

## 2023-02-14 ENCOUNTER — Encounter: Payer: Self-pay | Admitting: Dietician

## 2023-02-14 VITALS — Ht 69.5 in | Wt 398.6 lb

## 2023-02-14 DIAGNOSIS — Z6841 Body Mass Index (BMI) 40.0 and over, adult: Secondary | ICD-10-CM | POA: Insufficient documentation

## 2023-02-14 DIAGNOSIS — Z713 Dietary counseling and surveillance: Secondary | ICD-10-CM | POA: Insufficient documentation

## 2023-02-14 DIAGNOSIS — J45909 Unspecified asthma, uncomplicated: Secondary | ICD-10-CM | POA: Insufficient documentation

## 2023-02-14 DIAGNOSIS — K219 Gastro-esophageal reflux disease without esophagitis: Secondary | ICD-10-CM | POA: Diagnosis not present

## 2023-02-14 DIAGNOSIS — E669 Obesity, unspecified: Secondary | ICD-10-CM

## 2023-02-14 NOTE — Progress Notes (Addendum)
Nutrition Assessment for Bariatric Surgery: Pre-Surgery Behavioral and Nutrition Intervention Program   Medical Nutrition Therapy  Appt Start Time: 8:48    End Time: 9:54  Patient was seen on 02/14/2023 for Pre-Operative Nutrition Assessment. Purpose of todays visit  enhance perioperative outcomes along with a healthy weight maintenance   Referral stated Supervised Weight Loss (SWL) visits needed: 6 months  Planned surgery: RYGB Pt expectation of surgery: to be at a decent weight, where she feels better.   NUTRITION ASSESSMENT   Anthropometrics  Start weight at [facility]: 398.6 lbs (date: 02/14/2023)  Height: 69.5 in BMI: 58.02 kg/m2     Clinical   Pharmacotherapy: History of weight loss medication used: n/a  Medical hx: obesity, asthma, seasonal allergies, food allergies, GERD Medications: iron infusions, acetazolamide, adalimumab, albuterol, amlodipine, arnuity ellipta, atropine, azelastine, Azelex, bimatroprost, brimonidine, cetirizine, clinadamycin, dorzolamide-timolol, epinephrine, famotidine, fluticasone, gamunex-C, meloxicam, mometasone, montelukast, mycophenolate, prednisolone acetate, retin-A micro, sirolimus, sucralfate. Labs: A1c 5.7; triglycerides 182; HDL 34; LDL 110; Chol/HDL Ratio 5.2 Notable signs/symptoms: none noted Any previous deficiencies? No  Evaluation of Nutritional Deficiencies: Micronutrient Nutrition Focused Physical Exam: Hair: No issues observed Eyes: No issues observed Mouth: No issues observed Neck: No issues observed Nails: No issues observed Skin: No issues observed  Lifestyle & Dietary Hx  Pt states she was always active in grade school, stating after high school her friends were gone and states she did not have ambition, and wasn't as active and gained weight, until recently when she started school again and more active.  Pt states she just got a dog, stating he keeps her on her feet. 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 Pt states she is listening to her body and cutting portions. Pt states she is allergic to peaches, apple, oranges, peanut butter, shellfish, tomatoes  Current Physical Activity Recommendations state 150 minutes per week of moderate to vigorous movement including Cardio and 1-2 days of resistance activities as well as flexibility/balance activities:  Pts current physical activity: walking the dog daily for 15 minutes, with 50% recommendation reached   Sleep Hygiene: duration and quality: good, 6 hours a night  Current Patient Perceived Stress Level as stated by pt on a scale of 1-10:  3       Stress Management Techniques: listen to music; someone to talk to  According to the Dietary Guidelines for Americans Recommendation: equivalent 1.5-2 cups fruits per day, equivalent 2-3 cups vegetables per day and at least half all grains whole  Fruit servings per day (on average): 0-1, meeting 0-33% recommendation  Non-starchy vegetable servings per day (on average): 0-1, meeting 0-50% recommendation  Whole Grains per day (on average): 0-1  Number of meals missed/skipped per week out of 21: 7-10  24-Hr Dietary Recall First Meal: skip or oatmeal with fruit or eggs or grits Snack:  Second Meal: skip Snack:  Third Meal: baked fish, asparagus, potatoes Snack: pop cycle (outshine zero sugar) Beverages: Gatorade Zero, water with ice, Bubly sparkling water, sparkling ice  Alcoholic beverages per week: once or twice a year (multiple shots on birthday)   Estimated Energy Needs Calories: 1500  NUTRITION DIAGNOSIS  Overweight/obesity ([place]-3.3) related to past poor dietary habits and physical inactivity as evidenced by patient w/ planned RYGB surgery following dietary guidelines for continued weight loss.  NUTRITION INTERVENTION  Nutrition counseling (C-1) and education (E-2) to facilitate bariatric surgery goals.  Educated pt on micronutrient deficiencies post-surgery and behavioral/dietary  strategies to start in order to mitigate  that risk   Behavioral and Dietary Interventions Pre-Op Goals Reviewed with the Patient Nutrition: Healthy Eating Behaviors Switch to non-caloric, non-carbonated and non-caffeinated beverages such as  water, unsweetened tea, Crystal Light and zero calorie beverages (aim for 64 oz. per day) Cut out grazing between meals or at night  Find a protein shake you like Eat every 3-5 hours        Eliminate distractions while eating (TV, computer, reading, driving, texting) Take 16-10 minutes to eat a meal  Decrease high sugar foods/decrease high fat/fried foods Eliminate alcoholic beverages Increase protein intake (eggs, fish, chicken, yogurt) before surgery Eat non starchy vegetables 2 times a day 7 days a week Eat complex carbohydrates such as whole grains and fruits   Behavioral Modification: Physical Activity Increase my usual daily activity (use stairs, park farther, etc.) Engage in _______________________  activity  _______ minutes ______ times per week  Other:    _________________________________________________________________     Problem Solving I will think about my usual eating patterns and how to tweak them How can my friends and family support me Barriers to starting my changes Learn and understand appetite verses hunger   Healthy Coping Allow for ___________ activities per week to help me manage stress Reframe negative thoughts I will keep a picture of someone or something that is my inspiration & look at it daily   Monitoring  Weigh myself once a week  Measure my progress by monitoring how my clothes fit Keep a food record of what I eat and drink for the next ________ (time period) Take pictures of what I eat and drink for the next ________ (time period) Use an app to count steps/day for the next_______ (time period) Measure my progress such as increased energy and more restful sleep Monitor your acid reflux and bowel habits, are  they getting better?   *Goals that are bolded indicate the pt would like to start working towards these  Handouts Provided Include  Bariatric Surgery handouts (Nutrition Visits, Pre Surgery Behavioral Change Goals, Protein Shakes Brands to Choose From, Vitamins & Mineral Supplementation)  Learning Style & Readiness for Change Teaching method utilized: Visual, Auditory, and hands on  Demonstrated degree of understanding via: Teach Back  Readiness Level: preparation Barriers to learning/adherence to lifestyle change: nothing identified  RD's Notes for Next Visit Patient progress toward chosen goals    MONITORING & EVALUATION Dietary intake, weekly physical activity, body weight, and preoperative behavioral change goals   Next Steps  Patient is to follow up at [facility] in two weeks for first SWL visit.

## 2023-02-21 ENCOUNTER — Other Ambulatory Visit: Payer: Self-pay | Admitting: Podiatry

## 2023-02-28 DIAGNOSIS — H30033 Focal chorioretinal inflammation, peripheral, bilateral: Secondary | ICD-10-CM | POA: Diagnosis not present

## 2023-03-06 ENCOUNTER — Encounter (HOSPITAL_COMMUNITY)
Admission: RE | Admit: 2023-03-06 | Discharge: 2023-03-06 | Disposition: A | Discharge status: Home / Self Care | Payer: Medicare HMO | Source: Ambulatory Visit | Attending: Surgery | Admitting: Surgery

## 2023-03-06 DIAGNOSIS — R9431 Abnormal electrocardiogram [ECG] [EKG]: Secondary | ICD-10-CM | POA: Insufficient documentation

## 2023-03-06 DIAGNOSIS — Z01810 Encounter for preprocedural cardiovascular examination: Secondary | ICD-10-CM | POA: Insufficient documentation

## 2023-03-06 DIAGNOSIS — Z01818 Encounter for other preprocedural examination: Secondary | ICD-10-CM

## 2023-03-07 ENCOUNTER — Encounter: Payer: Medicare HMO | Attending: Surgery | Admitting: Dietician

## 2023-03-07 ENCOUNTER — Ambulatory Visit (HOSPITAL_BASED_OUTPATIENT_CLINIC_OR_DEPARTMENT_OTHER): Payer: Medicare HMO | Attending: Surgery | Admitting: Physical Therapy

## 2023-03-07 ENCOUNTER — Encounter (HOSPITAL_BASED_OUTPATIENT_CLINIC_OR_DEPARTMENT_OTHER): Payer: Self-pay | Admitting: Physical Therapy

## 2023-03-07 ENCOUNTER — Other Ambulatory Visit: Payer: Self-pay

## 2023-03-07 ENCOUNTER — Telehealth: Payer: Self-pay | Admitting: Family

## 2023-03-07 ENCOUNTER — Encounter: Payer: Self-pay | Admitting: Dietician

## 2023-03-07 VITALS — Ht 69.5 in | Wt >= 6400 oz

## 2023-03-07 DIAGNOSIS — M25562 Pain in left knee: Secondary | ICD-10-CM | POA: Diagnosis not present

## 2023-03-07 DIAGNOSIS — Z6841 Body Mass Index (BMI) 40.0 and over, adult: Secondary | ICD-10-CM | POA: Diagnosis not present

## 2023-03-07 DIAGNOSIS — E669 Obesity, unspecified: Secondary | ICD-10-CM | POA: Insufficient documentation

## 2023-03-07 DIAGNOSIS — Z713 Dietary counseling and surveillance: Secondary | ICD-10-CM | POA: Diagnosis not present

## 2023-03-07 DIAGNOSIS — M6281 Muscle weakness (generalized): Secondary | ICD-10-CM | POA: Insufficient documentation

## 2023-03-07 DIAGNOSIS — R2689 Other abnormalities of gait and mobility: Secondary | ICD-10-CM | POA: Insufficient documentation

## 2023-03-07 NOTE — Telephone Encounter (Signed)
Symptoms of post nasal drip started right after thanksgiving. She has a productive cough, but when she trying to cough up the mucus she throws up the mucus. Mucus is yellow. No cough, no fever, no body aches. Pt is taking all her normal meds and trying to stay hydrated

## 2023-03-07 NOTE — Telephone Encounter (Signed)
Please have her add mucinex 1200 twice a day and be aggressive with all those nasal sprays. Have her make an appointment if any worse. Thank you

## 2023-03-07 NOTE — Telephone Encounter (Signed)
Patient called in stating she is having a lot of mucus yellow in color no fever present asking for nurse to call for his issue

## 2023-03-07 NOTE — Progress Notes (Signed)
Supervised Weight Loss Visit Bariatric Nutrition Education Appt Start Time: 8:44    End Time: 9:05  Planned surgery: RYGB Pt expectation of surgery: to be at a decent weight, where she feels better.  1 out of 6 SWL Appointments    NUTRITION ASSESSMENT   Anthropometrics  Start weight at [facility]: 398.6 lbs (date: 02/14/2023)  Height: 69.5 in Weight today: 400.0 lbs BMI: 58.02 kg/m2     Clinical   Pharmacotherapy: History of weight loss medication used: n/a  Medical hx: obesity, asthma, seasonal allergies, food allergies, GERD Medications: iron infusions, acetazolamide, adalimumab, albuterol, amlodipine, arnuity ellipta, atropine, azelastine, Azelex, bimatroprost, brimonidine, cetirizine, clinadamycin, dorzolamide-timolol, epinephrine, famotidine, fluticasone, gamunex-C, meloxicam, mometasone, montelukast, mycophenolate, prednisolone acetate, retin-A micro, sirolimus, sucralfate. Labs: A1c 5.7; triglycerides 182; HDL 34; LDL 110; Chol/HDL Ratio 5.2 Notable signs/symptoms: none noted Any previous deficiencies? No  Lifestyle & Dietary Hx  Pt states she had an EKG done recently and everything was okay. Pt states she will be starting physical therapy, stating her insurance will cover it. Pt states her insurance will also cover water aerobics as well. Pt states she feels like she has been feeling nauseas lately, stating she may vomit. Pt states she is getting breakfast in more. Pt states she is practicing not drinking with meals and snacks.  Estimated daily fluid intake: 64 oz Supplements:  Current average weekly physical activity: ADLs  24-Hr Dietary Recall First Meal: skip or oatmeal with fruit or eggs or grits Snack:  Second Meal: skip Snack:  Third Meal: baked fish, asparagus, potatoes Snack: pop cycle (outshine zero sugar) Beverages: Gatorade Zero, water with ice, plain water or water with crystal light, un-sweet hot tea  Estimated Energy Needs Calories: 1500  NUTRITION  DIAGNOSIS  Overweight/obesity ([place]-3.3) related to past poor dietary habits and physical inactivity as evidenced by patient w/ planned RYGB surgery following dietary guidelines for continued weight loss.  NUTRITION INTERVENTION  Nutrition counseling (C-1) and education (E-2) to facilitate bariatric surgery goals.  Pre-Op Goals Reviewed with the Patient Encouraged patient to honor their body's internal hunger and fullness cues.  Throughout the day, check in mentally and rate hunger. Stop eating when satisfied not full regardless of how much food is left on the plate.  Get more if still hungry 20-30 minutes later.  The key is to honor satisfaction so throughout the meal, rate fullness factor and stop when comfortably satisfied not physically full. The key is to honor hunger and fullness without any feelings of guilt or shame.  Pay attention to what the internal cues are, rather than any external factors. This will enhance the confidence you have in listening to your own body and following those internal cues enabling you to increase how often you eat when you are hungry not out of appetite and stop when you are satisfied not full.  Encouraged pt to continue to eat balanced meals inclusive of non starchy vegetables 2 times a day 7 days a week Encouraged pt to continue to drink a minium 64 fluid ounces with half being plain water to satisfy proper hydration    Pre-Op Goals Progress & New Goals Continue: to avoid skipping meals; eat every 3-5 hours Continue: avoiding simple carbohydrates; find complex carbohydrate options like whole grains, whole fruits, unprocessed starchy vegetables. New: increase non-starchy vegetables, aim for 2 or more servings per day  Handouts Provided Include  Meal Ideas Handout  Learning Style & Readiness for Change Teaching method utilized: Visual & Auditory  Demonstrated degree of understanding  via: Teach Back  Readiness Level: preparation Barriers to learning/adherence  to lifestyle change: nothing identified  RD's Notes for Next Visit Patient progress toward chosen goals   MONITORING & EVALUATION Dietary intake, weekly physical activity, body weight, and pre-op goals in 1 month.   Next Steps  Patient is to return to [facility] in 1 month for next SWL visit.

## 2023-03-07 NOTE — Therapy (Signed)
OUTPATIENT PHYSICAL THERAPY LOWER EXTREMITY EVALUATION   Patient Name: Tiffany Velasquez MRN: [id number] DOB:08/20/1995, 27 y.o., female Today's Date: 03/07/2023  END OF SESSION:  PT End of Session - 03/07/23 1314     Visit Number 1    Number of Visits 16    Date for PT Re-Evaluation 05/02/23    Authorization Type [employer]    Progress Note Due on Visit 10    PT Start Time 1314   arrives late   PT Stop Time 1343    PT Time Calculation (min) 29 min    Activity Tolerance Patient tolerated treatment well    Behavior During Therapy [facility] for tasks assessed/performed             Past Medical History:  Diagnosis Date   Asthma    Eczema    Morbid obesity (HCC)    Past Surgical History:  Procedure Laterality Date   CATARACT EXTRACTION     no past surgery     Patient Active Problem List   Diagnosis Date Noted   Gastroesophageal reflux disease 11/08/2022   Iritis 07/26/2018   Seasonal and perennial allergic rhinitis 07/26/2018   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   High risk medication use 07/13/2018   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   Retinal edema 07/13/2018   Not well controlled moderate persistent asthma 01/16/2017   Intrinsic atopic dermatitis 01/16/2017   Hypertension 06/22/2012   Morbid obesity (HCC) 10/05/2009   Severe eczema 05/25/2006    PCP: Levin Erp, MD  REFERRING PROVIDER: Quentin Ore, MD  REFERRING DIAG: E66.01 (ICD-10-CM) - Morbid (severe) obesity due to excess calories  THERAPY DIAG:  Other abnormalities of gait and mobility  Muscle weakness (generalized)  Left knee pain, unspecified chronicity  Rationale for Evaluation and Treatment: Rehabilitation  ONSET DATE: Chronic  SUBJECTIVE:   SUBJECTIVE STATEMENT: Patient reports seeking weight loss surgery. She is halfway through her requirements for surgery. Aiming for June of  2025 for surgery. She is active but not as active as she could be. She just got a dog so she is more active with having the dog. 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 Has some left knee pain with mobility. Has been changing up eating habits. Feels like she needs to be moving more and generally feels weak.   PERTINENT HISTORY: Obesity, HTN PAIN:  Are you having pain? No  PRECAUTIONS: None  WEIGHT BEARING RESTRICTIONS: No  FALLS:  Has patient fallen in last 6 months? No  PLOF: Independent  PATIENT GOALS: to be more active   OBJECTIVE: (objective measures from initial evaluation unless otherwise dated)  PATIENT SURVEYS:  FOTO 48% function  COGNITION: Overall cognitive status: Within functional limits for tasks assessed     SENSATION: WFL   POSTURE: rounded shoulders and forward head    LOWER EXTREMITY ROM:  Active ROM Right eval Left eval  Hip flexion    Hip extension    Hip abduction    Hip adduction    Hip internal rotation    Hip external rotation    Knee flexion    Knee extension    Ankle dorsiflexion    Ankle plantarflexion    Ankle inversion    Ankle eversion     (Blank rows = not tested) *= pain/symptoms  LOWER EXTREMITY MMT:  MMT Right eval Left eval  Hip flexion 4+ 4+  Hip extension 4- 4  Hip abduction 4+ 4+  Hip adduction    Hip internal rotation    Hip external rotation    Knee flexion 5 5  Knee extension 5 5  Ankle dorsiflexion 5 5  Ankle plantarflexion    Ankle inversion    Ankle eversion     (Blank rows = not tested) *= pain/symptoms    FUNCTIONAL TESTS:  5 times sit to stand: 14.92 seconds, relies on momentum 2 minute walk test: 420 feet, LE fatigue beginning at 1:15 fatigue following   GAIT: Distance walked: 420 feet Assistive device utilized: None Level of assistance: Complete Independence Comments: , slightly wide BOS with antalgic on LLE   TODAY'S TREATMENT:                                                                                                                               DATE:  03/07/23 Eval    PATIENT EDUCATION:  Education details: Patient educated on exam findings, POC, scope of PT, HEP. Person educated: Patient Education method: Explanation, Demonstration, and Handouts Education comprehension: verbalized understanding, returned demonstration, verbal cues required, and tactile cues required  HOME EXERCISE PROGRAM: Initiate next session  ASSESSMENT:  CLINICAL IMPRESSION: Patient a 27 y.o. y.o. female who was seen today for physical therapy evaluation and treatment for general fitness deficits. Patient presents with pain limited deficits in bilateral LE and overall strength, ROM, endurance, activity tolerance, and functional mobility with ADL. Patient is having to modify and restrict ADL as indicated by outcome measure score as well as subjective information and objective measures which is affecting overall participation. Patient will benefit from skilled physical therapy in order to improve function and reduce impairment.  OBJECTIVE IMPAIRMENTS: Abnormal gait, decreased activity tolerance, decreased balance, decreased endurance, decreased mobility, difficulty walking, decreased ROM, decreased strength, impaired flexibility, improper body mechanics, postural dysfunction, obesity, and pain.   ACTIVITY LIMITATIONS: carrying, lifting, bending, standing, squatting, stairs, transfers, reach over head, hygiene/grooming, locomotion level, and caring for others  PARTICIPATION LIMITATIONS: meal prep, cleaning, laundry, shopping, community activity, occupation, and yard work  PERSONAL FACTORS: Fitness, Time since onset of injury/illness/exacerbation, and 1-2 comorbidities: Obesity, HTN,   are also affecting patient's functional outcome.   REHAB POTENTIAL: Good  CLINICAL DECISION MAKING: Stable/uncomplicated  EVALUATION COMPLEXITY:  Low   GOALS: Goals reviewed with patient? Yes  SHORT TERM GOALS: Target date: 04/04/2023    Patient will be independent with HEP in order to improve functional outcomes. Baseline: Goal status: INITIAL  2.  Patient will report at least 25% improvement in symptoms for improved quality of life. Baseline: Goal status: INITIAL    LONG TERM GOALS: Target date: 05/02/2023    Patient will report at least 75% improvement in symptoms for improved quality of life. Baseline:  Goal status: INITIAL  2.  Patient will improve FOTO score to predicted outcomes in order to indicate improved tolerance to activity.  Baseline:  Goal status: INITIAL  3.  Patient will be able to navigate stairs with reciprocal pattern without compensation and without rest breaks in order to demonstrate improved LE strength. Baseline: fatigue, must take rest break Goal status: INITIAL  4.  Patient will be able to ambulate at least 500 feet in in order to demonstrate improved tolerance to activity. Baseline: 420 feet Goal status: INITIAL  5.  Patient will be able to complete 5x STS in under 11.4 seconds in order to reduce the risk of falls. Baseline: 14.92 seconds, relies on momentum Goal status: INITIAL  6.  Patient will demonstrate grade of 5/5 MMT grade in all tested musculature as evidence of improved strength to assist with stair ambulation and gait.   Baseline:  Goal status: INITIAL   PLAN:  PT FREQUENCY: 1-2x/week  PT DURATION: 8 weeks  PLANNED INTERVENTIONS: 97164- PT Re-evaluation, 97110-Therapeutic exercises, 97530- Therapeutic activity, 97112- Neuromuscular re-education, 97535- Self Care, 16109- Manual therapy, [phone]- Gait training, [phone]- Orthotic Fit/training, [phone]- Canalith repositioning, [id number]- Aquatic Therapy, [phone]- Splinting, Patient/Family education, Balance training, Stair training, Taping, Dry Needling, Joint mobilization, Joint manipulation, Spinal manipulation, Spinal mobilization,  Scar mobilization, and DME instructions.  PLAN FOR NEXT SESSION: general strength and endurance, glute strength, possibly aquatics    Wyman Songster, PT 03/07/2023, 1:46 PM    Referring diagnosis? E66.01 (ICD-10-CM) - Morbid (severe) obesity due to excess calories Treatment diagnosis? (if different than referring diagnosis) R26.89 What was this (referring dx) caused by? []  Surgery []  Fall [x]  Ongoing issue []  Arthritis []  Other: ____________  Laterality: []  Rt []  Lt [x]  Both  Check all possible CPT codes:  *CHOOSE 10 OR LESS*    See Planned Interventions listed in the Plan section of the Evaluation.

## 2023-03-08 ENCOUNTER — Encounter: Payer: Self-pay | Admitting: Student

## 2023-03-08 ENCOUNTER — Ambulatory Visit (INDEPENDENT_AMBULATORY_CARE_PROVIDER_SITE_OTHER): Payer: Medicare HMO | Admitting: Student

## 2023-03-08 VITALS — BP 131/77 | Temp 97.9°F | Ht 69.0 in | Wt 399.5 lb

## 2023-03-08 DIAGNOSIS — J029 Acute pharyngitis, unspecified: Secondary | ICD-10-CM | POA: Diagnosis not present

## 2023-03-08 DIAGNOSIS — Z23 Encounter for immunization: Secondary | ICD-10-CM | POA: Diagnosis not present

## 2023-03-08 LAB — POCT RAPID STREP A (OFFICE): Rapid Strep A Screen: NEGATIVE

## 2023-03-08 MED ORDER — LORATADINE 10 MG PO TABS: 10.0000 mg | ORAL_TABLET | Freq: Every day | ORAL | 11 refills | Status: DC

## 2023-03-08 MED ORDER — OMEPRAZOLE 40 MG PO CPDR: 40.0000 mg | DELAYED_RELEASE_CAPSULE | Freq: Every day | ORAL | 0 refills | Status: DC

## 2023-03-08 MED ORDER — ONDANSETRON 4 MG PO TBDP: 4.0000 mg | ORAL_TABLET | Freq: Three times a day (TID) | ORAL | 0 refills | Status: DC | PRN

## 2023-03-08 NOTE — Progress Notes (Unsigned)
    SUBJECTIVE:   CHIEF COMPLAINT / HPI:   Sore Throat  Coughing up sputum: -She reports new onset sore throat and cough up sputum since Thanksgiving. -Has no appetite presently  -Clearing her throat frequently, concerned this is related to post nasal drip or her acid reflux   -Gargling salt water intermittently   -It is aggravated by  her flonase  and is relieved somewhat by medication for GERD although she cannot remember the name  -No diarrhea  -Recent GI studies: None    PERTINENT  PMH / PSH:  Obesity  Retinal Edema  Asthma  Blood pressure elevation, HTN   OBJECTIVE:   BP 131/77   Temp 97.9 F (36.6 C) (Oral)   Ht 5\' 9"  (1.753 m)   Wt (!) 399 lb 8 oz (181.2 kg)   LMP 03/07/2023   SpO2 100%   BMI 59.00 kg/m   General: Alert and oriented in no apparent distress Head: [place]/AT.   Eyes:  EOMI, PERRL.   Ears:  External ears WNL Nose:  Septum midline  Mouth:  MMM, tonsils slightly erythematous, non-edematous.   Heart: Regular rate and rhythm with no murmurs appreciated Lungs: CTA bilaterally, no wheezing Abdomen: Bowel sounds present, epigastric TTP  Skin: Warm and dry Extremities: No lower extremity edema   ASSESSMENT/PLAN:   Assessment & Plan Sore throat Seems possibly related to post nasal drip and GERD. Ordered PPI for the patient and instructed to switch to Claritin for antihistamine. Non acute abdomen and hemodynamically stable. Could continue with flonase and azelastine sprays, but patient reports that she feels they make her symptoms worse. Continue monitoring symptoms and ordered zofran for nausea on request. Patient requests a referral to Dr. Adela Lank with GI as her mom also sees him, will defer to Dr. Laroy Apple as PCP.      Alfredo Martinez, MD [facility]

## 2023-03-08 NOTE — Telephone Encounter (Signed)
Pt informed and stated understanding

## 2023-03-08 NOTE — Patient Instructions (Addendum)
It was great to see you today! Thank you for choosing [facility] for your primary care.  Today we addressed: Please stop the cetirizine and start claritin  Also take omeprazole once a day for the acid reflux  Return if not improving in 3 weeks  Zofran for nausea as needed every 8 hours   If you haven't already, sign up for My Chart to have easy access to your labs results, and communication with your primary care physician.  Please arrive 15 minutes before your appointment to ensure smooth check in process.  We appreciate your efforts in making this happen.  Thank you for allowing me to participate in your care, Alfredo Martinez, MD 03/08/2023, 11:07 AM PGY-3, [facility]

## 2023-03-13 ENCOUNTER — Ambulatory Visit: Payer: Medicare HMO | Admitting: Student

## 2023-03-13 NOTE — Progress Notes (Unsigned)
    SUBJECTIVE:   CHIEF COMPLAINT / HPI:   ***  PERTINENT  PMH / PSH: ***  OBJECTIVE:   LMP 03/07/2023   ***  ASSESSMENT/PLAN:   No problem-specific Assessment & Plan notes found for this encounter.     Levin Erp, MD [facility]

## 2023-03-14 ENCOUNTER — Ambulatory Visit (INDEPENDENT_AMBULATORY_CARE_PROVIDER_SITE_OTHER): Payer: Self-pay | Admitting: *Deleted

## 2023-03-14 DIAGNOSIS — J309 Allergic rhinitis, unspecified: Secondary | ICD-10-CM | POA: Diagnosis not present

## 2023-03-15 ENCOUNTER — Ambulatory Visit (HOSPITAL_BASED_OUTPATIENT_CLINIC_OR_DEPARTMENT_OTHER): Payer: Medicare HMO | Admitting: Physical Therapy

## 2023-03-15 ENCOUNTER — Encounter (HOSPITAL_BASED_OUTPATIENT_CLINIC_OR_DEPARTMENT_OTHER): Payer: Self-pay | Admitting: Physical Therapy

## 2023-03-15 DIAGNOSIS — M25562 Pain in left knee: Secondary | ICD-10-CM

## 2023-03-15 DIAGNOSIS — R2689 Other abnormalities of gait and mobility: Secondary | ICD-10-CM

## 2023-03-15 DIAGNOSIS — M6281 Muscle weakness (generalized): Secondary | ICD-10-CM | POA: Diagnosis not present

## 2023-03-15 NOTE — Therapy (Addendum)
 OUTPATIENT PHYSICAL THERAPY LOWER EXTREMITY TREATMENT PHYSICAL THERAPY DISCHARGE SUMMARY  Visits from Start of Care: 2  Current functional level related to goals / functional outcomes: unknown   Remaining deficits: unknown   Education / Equipment: Initial edu   Patient agrees to discharge. Patient goals were not met. Patient is being discharged due to not returning since the last visit.  Addend Corrie Dandy Tomma Lightning) Ziemba MPT 05/17/23 12:11 PM [facility] [address] [place], [place], [address] Phone: [phone]   Fax:  [phone]     Patient Name: Tiffany Velasquez MRN: [id number] DOB:[birth year], 27 y.o., female Today's Date: 03/15/2023  END OF SESSION:  PT End of Session - 03/15/23 0911     Visit Number 2    Number of Visits 16    Date for PT Re-Evaluation 05/02/23    Authorization Type [employer]    Authorization Time Period 16 visits --12/10/-05/02/23    Authorization - Visit Number 2    Authorization - Number of Visits 16    Progress Note Due on Visit 10    PT Start Time 0901    PT Stop Time 0940    PT Time Calculation (min) 39 min    Behavior During Therapy [facility] for tasks assessed/performed             Past Medical History:  Diagnosis Date   Asthma    Eczema    Morbid obesity (HCC)    Past Surgical History:  Procedure Laterality Date   CATARACT EXTRACTION     no past surgery     Patient Active Problem List   Diagnosis Date Noted   Gastroesophageal reflux disease 11/08/2022   Iritis 07/26/2018   Seasonal and perennial allergic rhinitis 07/26/2018   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   High risk medication use 07/13/2018   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   Retinal edema 07/13/2018   Not well controlled moderate persistent asthma 01/16/2017   Intrinsic atopic dermatitis 01/16/2017    Hypertension 06/22/2012   Morbid obesity (HCC) 10/05/2009   Severe eczema 05/25/2006    PCP: Levin Erp, MD  REFERRING PROVIDER: Quentin Ore, MD  REFERRING DIAG: E66.01 (ICD-10-CM) - Morbid (severe) obesity due to excess calories  THERAPY DIAG:  Other abnormalities of gait and mobility  Muscle weakness (generalized)  Left knee pain, unspecified chronicity  Rationale for Evaluation and Treatment: Rehabilitation  ONSET DATE: Chronic  SUBJECTIVE:   SUBJECTIVE STATEMENT: Pt reports she is not afraid of the water, but does not know how to swim.  No new changes since eval.   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   FROM EVALUATION:Patient reports seeking weight loss surgery. She is halfway through her requirements for surgery. Aiming for June of 2025 for surgery. She is active but not as active as she could be. She just got a dog so she is more active with having the dog. 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 Has some left knee pain with mobility. Has been changing up eating habits. Feels like she needs to be moving more and generally feels weak.   PERTINENT HISTORY: Obesity, HTN PAIN:  Are you having pain? No  PRECAUTIONS: None  WEIGHT BEARING RESTRICTIONS: No  FALLS:  Has patient fallen in last 6 months? No  PLOF:  Independent  PATIENT GOALS: to be more active   OBJECTIVE: (objective measures from initial evaluation unless otherwise dated)  PATIENT SURVEYS:  FOTO 48% function  COGNITION: Overall cognitive status: Within functional limits for tasks assessed     SENSATION: WFL   POSTURE: rounded shoulders and forward head    LOWER EXTREMITY ROM:  Active ROM Right eval Left eval  Hip flexion    Hip extension    Hip abduction    Hip adduction    Hip internal rotation    Hip external rotation    Knee flexion    Knee extension    Ankle dorsiflexion     Ankle plantarflexion    Ankle inversion    Ankle eversion     (Blank rows = not tested) *= pain/symptoms  LOWER EXTREMITY MMT:  MMT Right eval Left eval  Hip flexion 4+ 4+  Hip extension 4- 4  Hip abduction 4+ 4+  Hip adduction    Hip internal rotation    Hip external rotation    Knee flexion 5 5  Knee extension 5 5  Ankle dorsiflexion 5 5  Ankle plantarflexion    Ankle inversion    Ankle eversion     (Blank rows = not tested) *= pain/symptoms    FUNCTIONAL TESTS:  5 times sit to stand: 14.92 seconds, relies on momentum 2 minute walk test: 420 feet, LE fatigue beginning at 1:15 fatigue following   GAIT: Distance walked: 420 feet Assistive device utilized: None Level of assistance: Complete Independence Comments: , slightly wide BOS with antalgic on LLE   TODAY'S TREATMENT:                                                                                                                              Pt seen for aquatic therapy today.  Treatment took place in water 3.5-4.75 ft in depth at the [facility] pool. Temp of water was 91.  Pt entered/exited the pool via stairs independently with bilat  rail. * intro to aquatic therapy principles and properties * walking forward/ backward unsupported - multiple laps  * side stepping with arm addct/ abdct with rainbow hand floats x 3 laps  * holding wall:  heel raises x 10,  hip abdct/ addct x 10, leg swings into hip flex/ext * walking forward/ backward with increased speed -> light jogging forward/ backward (RPE 3/10, HR 115bpm) * straddling yellow noodle and holding corner:  cycling,  hip abdct/ addct * TrA set with hollow short noodle pull down to thighs x 10  Pt requires the buoyancy and hydrostatic pressure of water for support, and to offload joints by unweighting joint load by at least 50 % in navel deep water and by at least 75-80% in chest to neck deep water.  Viscosity of the water is needed for resistance  of strengthening. Water current perturbations provides challenge to standing balance requiring increased core activation.     PATIENT  EDUCATION:  Education details: intro to aquatic therapy. Person educated: Patient Education method: Solicitor,  Education comprehension: verbalized understanding, returned demonstration, verbal cues required, and tactile cues required  HOME EXERCISE PROGRAM: Initiate next session  ASSESSMENT:  CLINICAL IMPRESSION: Pt confident in aquatic setting and able to take direction from therapist on deck.  Pt tolerated exercises well, without excessive fatigue or any production of pain. Pt to ck if she has access to [facility] pool, what equipment she would have access to, pool temp, and pool schedule - prior to next session.  Goals are ongoing.    From initial evaluation: Patient a 27 y.o. y.o. female who was seen today for physical therapy evaluation and treatment for general fitness deficits. Patient presents with pain limited deficits in bilateral LE and overall strength, ROM, endurance, activity tolerance, and functional mobility with ADL. Patient is having to modify and restrict ADL as indicated by outcome measure score as well as subjective information and objective measures which is affecting overall participation. Patient will benefit from skilled physical therapy in order to improve function and reduce impairment.  OBJECTIVE IMPAIRMENTS: Abnormal gait, decreased activity tolerance, decreased balance, decreased endurance, decreased mobility, difficulty walking, decreased ROM, decreased strength, impaired flexibility, improper body mechanics, postural dysfunction, obesity, and pain.   ACTIVITY LIMITATIONS: carrying, lifting, bending, standing, squatting, stairs, transfers, reach over head, hygiene/grooming, locomotion level, and caring for others  PARTICIPATION LIMITATIONS: meal prep, cleaning, laundry, shopping, community activity, occupation, and  yard work  PERSONAL FACTORS: Fitness, Time since onset of injury/illness/exacerbation, and 1-2 comorbidities: Obesity, HTN,   are also affecting patient's functional outcome.   REHAB POTENTIAL: Good  CLINICAL DECISION MAKING: Stable/uncomplicated  EVALUATION COMPLEXITY: Low   GOALS: Goals reviewed with patient? Yes  SHORT TERM GOALS: Target date: 04/04/2023    Patient will be independent with HEP in order to improve functional outcomes. Baseline: Goal status: INITIAL  2.  Patient will report at least 25% improvement in symptoms for improved quality of life. Baseline: Goal status: INITIAL    LONG TERM GOALS: Target date: 05/02/2023    Patient will report at least 75% improvement in symptoms for improved quality of life. Baseline:  Goal status: INITIAL  2.  Patient will improve FOTO score to predicted outcomes in order to indicate improved tolerance to activity. Baseline:  Goal status: INITIAL  3.  Patient will be able to navigate stairs with reciprocal pattern without compensation and without rest breaks in order to demonstrate improved LE strength. Baseline: fatigue, must take rest break Goal status: INITIAL  4.  Patient will be able to ambulate at least 500 feet in in order to demonstrate improved tolerance to activity. Baseline: 420 feet Goal status: INITIAL  5.  Patient will be able to complete 5x STS in under 11.4 seconds in order to reduce the risk of falls. Baseline: 14.92 seconds, relies on momentum Goal status: INITIAL  6.  Patient will demonstrate grade of 5/5 MMT grade in all tested musculature as evidence of improved strength to assist with stair ambulation and gait.   Baseline:  Goal status: INITIAL   PLAN:  PT FREQUENCY: 1-2x/week  PT DURATION: 8 weeks  PLANNED INTERVENTIONS: 97164- PT Re-evaluation, 97110-Therapeutic exercises, 97530- Therapeutic activity, 97112- Neuromuscular re-education, 97535- Self Care, 19147- Manual therapy, [phone]-  Gait training, [phone]- Orthotic Fit/training, [phone]- Canalith repositioning, [id number]- Aquatic Therapy, [phone]- Splinting, Patient/Family education, Balance training, Stair training, Taping, Dry Needling, Joint mobilization, Joint manipulation, Spinal manipulation, Spinal mobilization, Scar mobilization, and DME instructions.  PLAN FOR NEXT SESSION: general strength and endurance, glute strength, possibly aquatics    Mayer Camel, [place] 03/15/23 9:46 AM [facility] [address] [place], [place], [address] Phone: [phone]   Fax:  [phone]    Referring diagnosis? E66.01 (ICD-10-CM) - Morbid (severe) obesity due to excess calories Treatment diagnosis? (if different than referring diagnosis) R26.89 What was this (referring dx) caused by? []  Surgery []  Fall [x]  Ongoing issue []  Arthritis []  Other: ____________  Laterality: []  Rt []  Lt [x]  Both  Check all possible CPT codes:  *CHOOSE 10 OR LESS*    See Planned Interventions listed in the Plan section of the Evaluation.

## 2023-03-17 DIAGNOSIS — Z452 Encounter for adjustment and management of vascular access device: Secondary | ICD-10-CM | POA: Diagnosis not present

## 2023-03-17 DIAGNOSIS — T82868A Thrombosis of vascular prosthetic devices, implants and grafts, initial encounter: Secondary | ICD-10-CM | POA: Diagnosis not present

## 2023-03-17 DIAGNOSIS — T82514A Breakdown (mechanical) of infusion catheter, initial encounter: Secondary | ICD-10-CM | POA: Diagnosis not present

## 2023-03-20 DIAGNOSIS — Z79899 Other long term (current) drug therapy: Secondary | ICD-10-CM | POA: Diagnosis not present

## 2023-03-20 DIAGNOSIS — H30033 Focal chorioretinal inflammation, peripheral, bilateral: Secondary | ICD-10-CM | POA: Diagnosis not present

## 2023-03-20 DIAGNOSIS — H44113 Panuveitis, bilateral: Secondary | ICD-10-CM | POA: Diagnosis not present

## 2023-03-24 ENCOUNTER — Ambulatory Visit (HOSPITAL_BASED_OUTPATIENT_CLINIC_OR_DEPARTMENT_OTHER): Payer: Medicare HMO | Admitting: Physical Therapy

## 2023-03-27 ENCOUNTER — Encounter (HOSPITAL_COMMUNITY): Payer: Self-pay | Admitting: Surgery

## 2023-03-27 ENCOUNTER — Other Ambulatory Visit: Payer: Self-pay

## 2023-03-27 DIAGNOSIS — H30033 Focal chorioretinal inflammation, peripheral, bilateral: Secondary | ICD-10-CM | POA: Diagnosis not present

## 2023-03-27 NOTE — Progress Notes (Signed)
COVID Vaccine Completed: yes  Date of COVID positive in last 90 days: no  PCP - Levin Erp, MD Cardiologist - n/a  Chest x-ray - 03/17/23 CEW EKG - 03/06/23 Epic Stress Test - n/a ECHO - n/a Cardiac Cath - n/a Pacemaker/ICD device last checked: n/a Spinal Cord Stimulator: n/a  Bowel Prep - NPO after midnight  Sleep Study - n/a CPAP -   Fasting Blood Sugar - n/a Checks Blood Sugar _____ times a day  Last dose of GLP1 agonist-  N/A GLP1 instructions:  Hold 7 days before surgery    Last dose of SGLT-2 inhibitors-  N/A SGLT-2 instructions:  Hold 3 days before surgery    Blood Thinner Instructions: n/a Aspirin Instructions: Last Dose:  Activity level: Can go up a flight of stairs and perform activities of daily living without stopping and without symptoms of chest pain. SOB occasional, has history asthma   Anesthesia review:   Patient denies shortness of breath, fever, cough and chest pain at PAT appointment  Patient verbalized understanding of instructions that were given to them at the PAT appointment. Patient was also instructed that they will need to review over the PAT instructions again at home before surgery.

## 2023-03-30 ENCOUNTER — Other Ambulatory Visit: Payer: Self-pay

## 2023-03-30 MED ORDER — ONDANSETRON 4 MG PO TBDP: 4.0000 mg | ORAL_TABLET | Freq: Three times a day (TID) | ORAL | 0 refills | Status: DC | PRN

## 2023-03-30 MED ORDER — SUCRALFATE 1 GM/10ML PO SUSP: 1.0000 g | Freq: Three times a day (TID) | ORAL | 0 refills | Status: DC

## 2023-03-30 NOTE — Telephone Encounter (Signed)
 Patients mother calls nurse line requesting a refill on Zofran  and Carafate  Suspension.   She reports she was seen earlier in December by a different provider, however she forgot to ask her to refill both of these medications.   She is also requesting a GI referral to be placed.   Advised will forward to PCP.

## 2023-03-31 NOTE — Telephone Encounter (Signed)
 Spoke with patient. Made appt for Jan. 14th at 2:10. Aquilla Solian, CMA

## 2023-04-03 ENCOUNTER — Ambulatory Visit (HOSPITAL_BASED_OUTPATIENT_CLINIC_OR_DEPARTMENT_OTHER): Payer: Medicare HMO

## 2023-04-04 ENCOUNTER — Encounter (HOSPITAL_COMMUNITY)
Admission: RE | Disposition: A | Discharge status: Home / Self Care | Payer: Self-pay | Source: Ambulatory Visit | Attending: Surgery

## 2023-04-04 ENCOUNTER — Encounter (HOSPITAL_COMMUNITY): Payer: Self-pay | Admitting: Surgery

## 2023-04-04 ENCOUNTER — Other Ambulatory Visit: Payer: Self-pay

## 2023-04-04 ENCOUNTER — Ambulatory Visit (HOSPITAL_COMMUNITY)
Admission: RE | Admit: 2023-04-04 | Discharge: 2023-04-04 | Disposition: A | Discharge status: Home / Self Care | Payer: Medicare HMO | Source: Ambulatory Visit | Attending: Surgery | Admitting: Surgery

## 2023-04-04 ENCOUNTER — Ambulatory Visit (HOSPITAL_BASED_OUTPATIENT_CLINIC_OR_DEPARTMENT_OTHER): Payer: Medicare HMO | Admitting: Anesthesiology

## 2023-04-04 ENCOUNTER — Ambulatory Visit (HOSPITAL_COMMUNITY): Payer: Medicare HMO | Admitting: Anesthesiology

## 2023-04-04 DIAGNOSIS — Z6841 Body Mass Index (BMI) 40.0 and over, adult: Secondary | ICD-10-CM | POA: Insufficient documentation

## 2023-04-04 DIAGNOSIS — K2950 Unspecified chronic gastritis without bleeding: Secondary | ICD-10-CM | POA: Diagnosis not present

## 2023-04-04 DIAGNOSIS — K219 Gastro-esophageal reflux disease without esophagitis: Secondary | ICD-10-CM | POA: Insufficient documentation

## 2023-04-04 DIAGNOSIS — E6689 Other obesity not elsewhere classified: Secondary | ICD-10-CM | POA: Insufficient documentation

## 2023-04-04 DIAGNOSIS — K2970 Gastritis, unspecified, without bleeding: Secondary | ICD-10-CM

## 2023-04-04 DIAGNOSIS — K2960 Other gastritis without bleeding: Secondary | ICD-10-CM | POA: Diagnosis not present

## 2023-04-04 DIAGNOSIS — I10 Essential (primary) hypertension: Secondary | ICD-10-CM | POA: Diagnosis not present

## 2023-04-04 DIAGNOSIS — J45909 Unspecified asthma, uncomplicated: Secondary | ICD-10-CM | POA: Diagnosis not present

## 2023-04-04 HISTORY — PX: BIOPSY: SHX5522

## 2023-04-04 HISTORY — PX: ESOPHAGOGASTRODUODENOSCOPY: SHX5428

## 2023-04-04 HISTORY — DX: Unspecified glaucoma: H40.9

## 2023-04-04 HISTORY — DX: Essential (primary) hypertension: I10

## 2023-04-04 LAB — PREGNANCY, URINE: Preg Test, Ur: NEGATIVE

## 2023-04-04 SURGERY — EGD (ESOPHAGOGASTRODUODENOSCOPY)
Anesthesia: Monitor Anesthesia Care

## 2023-04-04 MED ORDER — PROPOFOL 500 MG/50ML IV EMUL
INTRAVENOUS | Status: AC
  Filled 2023-04-04: qty 50

## 2023-04-04 MED ORDER — DEXMEDETOMIDINE HCL IN NACL 80 MCG/20ML IV SOLN
INTRAVENOUS | Status: DC | PRN
  Administered 2023-04-04 (×4): 4 ug via INTRAVENOUS

## 2023-04-04 MED ORDER — LIDOCAINE HCL (CARDIAC) PF 50 MG/5ML IV SOSY
PREFILLED_SYRINGE | INTRAVENOUS | Status: DC | PRN
  Administered 2023-04-04: 60 mg via INTRAVENOUS

## 2023-04-04 MED ORDER — KETAMINE HCL 10 MG/ML IJ SOLN
INTRAMUSCULAR | Status: DC | PRN
  Administered 2023-04-04: 20 mg via INTRAVENOUS

## 2023-04-04 MED ORDER — GLYCOPYRROLATE 0.2 MG/ML IJ SOLN
INTRAMUSCULAR | Status: DC | PRN
  Administered 2023-04-04 (×2): .1 mg via INTRAVENOUS

## 2023-04-04 MED ORDER — PROPOFOL 500 MG/50ML IV EMUL
INTRAVENOUS | Status: DC | PRN
  Administered 2023-04-04: 100 ug/kg/min via INTRAVENOUS
  Administered 2023-04-04: 50 mg via INTRAVENOUS
  Administered 2023-04-04: 14400 ug via INTRAVENOUS
  Administered 2023-04-04 (×2): 50 mg via INTRAVENOUS

## 2023-04-04 MED ORDER — SODIUM CHLORIDE 0.9 % IV SOLN: INTRAVENOUS | Status: DC | PRN

## 2023-04-04 NOTE — Anesthesia Preprocedure Evaluation (Addendum)
 Anesthesia Evaluation  Patient identified by MRN, date of birth, ID band Patient awake    Reviewed: Allergy  & Precautions, NPO status , Patient's Chart, lab work & pertinent test results  Airway Mallampati: II  TM Distance: >3 FB Neck ROM: Full    Dental no notable dental hx.    Pulmonary asthma    Pulmonary exam normal        Cardiovascular hypertension, Pt. on medications Normal cardiovascular exam     Neuro/Psych negative neurological ROS  negative psych ROS   GI/Hepatic Neg liver ROS,GERD  Medicated and Controlled,,  Endo/Other    Class 4 obesity  Renal/GU negative Renal ROS     Musculoskeletal negative musculoskeletal ROS (+)    Abdominal  (+) + obese  Peds  Hematology negative hematology ROS (+)   Anesthesia Other Findings gastro reflux morbid obesity  Reproductive/Obstetrics Hcg negative                             Anesthesia Physical Anesthesia Plan  ASA: 4  Anesthesia Plan: MAC   Post-op Pain Management:    Induction:   PONV Risk Score and Plan: 2 and Propofol  infusion and Treatment may vary due to age or medical condition  Airway Management Planned: Nasal Cannula  Additional Equipment:   Intra-op Plan:   Post-operative Plan:   Informed Consent: I have reviewed the patients History and Physical, chart, labs and discussed the procedure including the risks, benefits and alternatives for the proposed anesthesia with the patient or authorized representative who has indicated his/her understanding and acceptance.       Plan Discussed with: CRNA  Anesthesia Plan Comments:        Anesthesia Quick Evaluation

## 2023-04-04 NOTE — H&P (Signed)
 Admitting Physician: Deward PARAS Nga Rabon  Service: Bariatric Surgery  CC: Obesity  Subjective   HPI: Tiffany Velasquez is an 28 y.o. female who is here for EGD prior to bariatric surgery.  Past Medical History:  Diagnosis Date   Asthma    Eczema    Glaucoma    Hypertension    Morbid obesity (HCC)     Past Surgical History:  Procedure Laterality Date   CATARACT EXTRACTION     no past surgery     PORTA CATH INSERTION      Family History  Problem Relation Age of Onset   Sudden death Cousin    Asthma Mother    Allergic rhinitis Father    Eczema Neg Hx    Urticaria Neg Hx     Social:  reports that she has never smoked. She has never used smokeless tobacco. She reports current alcohol use. She reports that she does not use drugs.  Allergies:  Allergies  Allergen Reactions   Peanut -Containing Drug Products Swelling    Throat swelling   Shellfish Allergy  Swelling    Throat swelling    Apple Juice Rash   Orange Fruit [Citrus] Rash   Peach Flavoring Agent (Non-Screening) Rash   Tomato Rash   Tree Extract Rash    Medications: Current Outpatient Medications  Medication Instructions   acetaZOLAMIDE  (DIAMOX ) 250 mg, Oral, 2 times daily   Adalimumab 40 MG/0.4ML PNKT Subcutaneous   albuterol  (VENTOLIN  HFA) 108 (90 Base) MCG/ACT inhaler Inhale 2 puffs every 4-6 hours as needed for cough, wheeze, tightness in chest, or shortness of breath   amLODipine  (NORVASC ) 5 mg, Oral, Daily at bedtime   ARNUITY ELLIPTA  100 MCG/ACT AEPB 2 puffs, Inhalation, 2 times daily   azelastine  (ASTELIN ) 0.1 % nasal spray USE 2 SPRAYS IN EACH NOSTRIL TWICE DAILY AS NEEDED FOR RUNNY NOSE/DRAINAGE DOWN THROAT   AZELEX 20 % cream Topical, Daily   bimatoprost (LUMIGAN) 0.01 % SOLN Ophthalmic   brimonidine  (ALPHAGAN ) 0.15 % ophthalmic solution 1 drop, 3 times daily   budesonide -formoterol  (SYMBICORT ) 160-4.5 MCG/ACT inhaler 2 puffs, Inhalation, 2 times daily   clindamycin (CLEOCIN T) 1 % lotion  Topical, 2 times daily   dorzolamide -timolol  (COSOPT ) 2-0.5 % ophthalmic solution 1 drop, 2 times daily   famotidine  (PEPCID ) 20 mg, Oral, 2 times daily   fluticasone  (FLONASE ) 50 MCG/ACT nasal spray 1 spray, Each Nare, Daily   GAMUNEX-C 20 GM/200ML SOLN    loratadine  (CLARITIN ) 10 mg, Oral, Daily   meloxicam  (MOBIC ) 15 MG tablet TAKE 1 TABLET(15 MG) BY MOUTH DAILY   mometasone  (ELOCON ) 0.1 % cream 1 Application, Topical, 2 times daily   montelukast  (SINGULAIR ) 10 mg, Oral, Daily at bedtime   mycophenolate (CELLCEPT) 500 MG tablet Oral   omeprazole  (PRILOSEC) 40 mg, Oral, Daily   ondansetron  (ZOFRAN -ODT) 4 mg, Oral, Every 8 hours PRN   RETIN-A  MICRO 0.04 % gel 1 application , Topical, Daily   sirolimus (RAPAMUNE) 2 MG tablet Oral   sucralfate  (CARAFATE ) 1 g, Oral, 3 times daily with meals & bedtime    ROS - all of the below systems have been reviewed with the patient and positives are indicated with bold text General: chills, fever or night sweats Eyes: blurry vision or double vision ENT: epistaxis or sore throat Allergy /Immunology: itchy/watery eyes or nasal congestion Hematologic/Lymphatic: bleeding problems, blood clots or swollen lymph nodes Endocrine: temperature intolerance or unexpected weight changes Breast: new or changing breast lumps or nipple discharge Resp: cough, shortness of  breath, or wheezing CV: chest pain or dyspnea on exertion GI: as per HPI GU: dysuria, trouble voiding, or hematuria MSK: joint pain or joint stiffness Neuro: TIA or stroke symptoms Derm: pruritus and skin lesion changes Psych: anxiety and depression  Objective   PE Height 5' 9.5 (1.765 m), weight (!) 180.5 kg, last menstrual period 03/07/2023. Constitutional: NAD; conversant; no deformities Eyes: Moist conjunctiva; no lid lag; anicteric; PERRL Neck: Trachea midline; no thyromegaly Lungs: Normal respiratory effort; no tactile fremitus CV: RRR; no palpable thrills; no pitting edema GI: Abd  Soft, nontender; no palpable hepatosplenomegaly MSK: Normal range of motion of extremities; no clubbing/cyanosis Psychiatric: Appropriate affect; alert and oriented x3 Lymphatic: No palpable cervical or axillary lymphadenopathy  No results found for this or any previous visit (from the past 24 hours).  Imaging Orders  No imaging studies ordered today     Assessment and Plan   Tiffany Velasquez is an 28 y.o. female with obesity, BMI 62.07, who presents for EGD prior to bariatric surgery.  She is interested in gastric bypass.    Upper endoscopy with biopsy. I explained my rational for performing upper endoscopy in my bariatric patients. During the procedure I will biopsy for H. Pylori, evaluate for hiatal hernia, evaluate for reflux esophagitis and look for any other abnormalities that may influence the procedure. We discussed the risks, benefits and alternative to this procedure and the patient granted consent to proceed.    Deward JINNY Foy, MD  [facility] Use [email or web address] to contact on call provider

## 2023-04-04 NOTE — Discharge Instructions (Signed)
YOU HAD AN ENDOSCOPIC PROCEDURE TODAY: Refer to the procedure report and other information in the discharge instructions given to you for any specific questions about what was found during the examination. If this information does not answer your questions, please call [facility] office at [phone] to clarify.   YOU SHOULD EXPECT: Some feelings of bloating in the abdomen. Passage of more gas than usual. Walking can help get rid of the air that was put into your GI tract during the procedure and reduce the bloating.  DIET: Your first meal following the procedure should be a light meal and then it is ok to progress to your normal diet. A half-sandwich or bowl of soup is an example of a good first meal. Heavy or fried foods are harder to digest and may make you feel nauseous or bloated. Drink plenty of fluids but you should avoid alcoholic beverages for 24 hours.  ACTIVITY: Your care partner should take you home directly after the procedure. You should plan to take it easy, moving slowly for the rest of the day. You can resume normal activity the day after the procedure however YOU SHOULD NOT DRIVE, use power tools, machinery or perform tasks that involve climbing or major physical exertion for 24 hours (because of the sedation medicines used during the test).   SYMPTOMS TO REPORT IMMEDIATELY: A general surgeon can be reached at any hour. Please call [phone]  for any of the following symptoms:   Following upper endoscopy (EGD, EUS, ERCP, esophageal dilation) Vomiting of blood or coffee ground material  New, significant abdominal pain  New, significant chest pain or pain under the shoulder blades  Painful or persistently difficult swallowing  New shortness of breath  Black, tarry-looking or red, bloody stools  FOLLOW UP:  If any biopsies were taken you will be contacted by phone or by letter within the next 1-3 weeks. Call [phone]  if you have not heard about the biopsies  in 3 weeks.  Please also call with any specific questions about appointments or follow up tests.

## 2023-04-04 NOTE — Transfer of Care (Signed)
 Immediate Anesthesia Transfer of Care Note  Patient: Tiffany Velasquez  Procedure(s) Performed: ESOPHAGOGASTRODUODENOSCOPY (EGD) BIOPSY  Patient Location: PACU and Endoscopy Unit  Anesthesia Type:General  Level of Consciousness: awake, alert , and oriented  Airway & Oxygen Therapy: Patient Spontanous Breathing  Post-op Assessment: Report given to RN and Post -op Vital signs reviewed and stable  Post vital signs: Reviewed and stable  Last Vitals:  Vitals Value Taken Time  BP 121/73 04/04/23 1326  Temp    Pulse 96 04/04/23 1327  Resp 14 04/04/23 1327  SpO2 100 % 04/04/23 1327  Vitals shown include unfiled device data.  Last Pain:  Vitals:   04/04/23 1326  TempSrc:   PainSc: 0-No pain         Complications: No notable events documented.

## 2023-04-04 NOTE — Op Note (Signed)
 [facility] Patient Name: Tiffany Velasquez Procedure Date: 04/04/2023 MRN: [id number] Attending MD: Deward JINNY Foy , ,  Date of Birth: Jan 29, 1996 CSN: [id number] Age: 28 Admit Type: Outpatient Procedure:                Upper GI endoscopy Indications:               Providers:                Deward DOROTHA Foy, Particia Fischer, RN, Corene Southgate, Technician Referring MD:              Medicines:                Monitored Anesthesia Care Complications:            No immediate complications. Estimated Blood Loss:     Estimated blood loss was minimal. Procedure:                Pre-Anesthesia Assessment:                           - Prior to the procedure, a History and Physical                            was performed, and patient medications and                            allergies were reviewed. The patient is competent.                            The risks and benefits of the procedure and the                            sedation options and risks were discussed with the                            patient. All questions were answered and informed                            consent was obtained. Patient identification and                            proposed procedure were verified by the physician                            in the pre-procedure area. Mental Status                            Examination: alert and oriented. Airway                            Examination: normal oropharyngeal airway and neck                            mobility. Respiratory Examination: clear  to                            auscultation. CV Examination: normal. ASA Grade                            Assessment: II - A patient with mild systemic                            disease. After reviewing the risks and benefits,                            the patient was deemed in satisfactory condition to                            undergo the procedure. The anesthesia plan was to                             use monitored anesthesia care (MAC). Immediately                            prior to administration of medications, the patient                            was re-assessed for adequacy to receive sedatives.                            The heart rate, respiratory rate, oxygen                            saturations, blood pressure, adequacy of pulmonary                            ventilation, and response to care were monitored                            throughout the procedure. The physical status of                            the patient was re-assessed after the procedure.                           After obtaining informed consent, the endoscope was                            passed under direct vision. Throughout the                            procedure, the patient's blood pressure, pulse, and                            oxygen saturations were monitored continuously. The  [id number]-H190 ([id number]) Olympus endoscope was introduced                            through the mouth, and advanced to the second part                            of duodenum. The upper GI endoscopy was                            accomplished without difficulty. The patient                            tolerated the procedure well. Scope In: Scope Out: Findings:      The examined esophagus was normal.      The gastroesophageal flap valve was visualized endoscopically and       classified as Hill Grade I (prominent fold, tight to endoscope).      Diffuse moderate inflammation was found in the stomach. Biopsies were       taken with a cold forceps for Helicobacter pylori testing. Verification       of patient identification for the specimen was done. Estimated blood       loss was minimal.      The in the duodenum was normal. Impression:               - Normal esophagus.                           - Gastroesophageal flap valve classified as Hill                            Grade  I (prominent fold, tight to endoscope).                           - Nodular gastritis. Biopsied.                           - Normal. Moderate Sedation:      Moderate (conscious) sedation was personally administered by an       anesthesia professional. The following parameters were monitored: oxygen       saturation, heart rate, blood pressure, and response to care. Total       physician intraservice time was 15 minutes. Recommendation:           - Discharge patient to home.                           - Resume previous diet.                           - Continue present medications.                           - Await pathology results. Procedure Code(s):        --- Professional ---                           [phone], Esophagogastroduodenoscopy, flexible,  transoral; with biopsy, single or multiple Diagnosis Code(s):        --- Professional ---                           K29.60, Other gastritis without bleeding CPT copyright 2022 American Medical Association. All rights reserved. The codes documented in this report are preliminary and upon coder review may  be revised to meet current compliance requirements. Deward PARAS Bertice Risse,  04/04/2023 1:28:07 PM Number of Addenda: 0

## 2023-04-04 NOTE — Anesthesia Procedure Notes (Signed)
 Procedure Name: General with mask airway Date/Time: 04/04/2023 12:51 PM  Performed by: Harl Armida PARAS, CRNAPre-anesthesia Checklist: Patient identified, Emergency Drugs available, Suction available, Patient being monitored and Timeout performed Oxygen Delivery Method: Simple face mask Preoxygenation: Pre-oxygenation with 100% oxygen Induction Type: IV induction Placement Confirmation: positive ETCO2

## 2023-04-05 ENCOUNTER — Encounter: Payer: Self-pay | Admitting: Physician Assistant

## 2023-04-05 ENCOUNTER — Telehealth: Payer: Self-pay

## 2023-04-05 ENCOUNTER — Ambulatory Visit (HOSPITAL_BASED_OUTPATIENT_CLINIC_OR_DEPARTMENT_OTHER): Payer: Medicare HMO

## 2023-04-05 LAB — SURGICAL PATHOLOGY

## 2023-04-05 MED ORDER — SUCRALFATE 1 GM/10ML PO SUSP: 1.0000 g | Freq: Three times a day (TID) | ORAL | 0 refills | Status: DC

## 2023-04-05 NOTE — Anesthesia Postprocedure Evaluation (Signed)
 Anesthesia Post Note  Patient: Tiffany Velasquez  Procedure(s) Performed: ESOPHAGOGASTRODUODENOSCOPY (EGD) BIOPSY     Patient location during evaluation: Endoscopy Anesthesia Type: MAC Level of consciousness: awake Pain management: pain level controlled Vital Signs Assessment: post-procedure vital signs reviewed and stable Respiratory status: spontaneous breathing, nonlabored ventilation and respiratory function stable Cardiovascular status: blood pressure returned to baseline and stable Postop Assessment: no apparent nausea or vomiting Anesthetic complications: no   No notable events documented.  Last Vitals:  Vitals:   04/04/23 1330 04/04/23 1340  BP: (!) 124/92 (!) 127/94  Pulse: (!) 112 83  Resp: (!) 26 18  Temp:    SpO2: 100% 100%    Last Pain:  Vitals:   04/04/23 1340  TempSrc:   PainSc: 0-No pain                 Tiffany Velasquez

## 2023-04-05 NOTE — Telephone Encounter (Signed)
 Received call from patient regarding rx refill for Carafate . This was ordered as print by Dr. Christia on 03/30/23.  Patient requesting that prescription be sent to [facility].   Electronically sent per patient request.   Chiquita JAYSON English, RN

## 2023-04-06 ENCOUNTER — Encounter (HOSPITAL_COMMUNITY): Payer: Self-pay | Admitting: Surgery

## 2023-04-07 ENCOUNTER — Encounter: Payer: Self-pay | Admitting: Dietician

## 2023-04-07 ENCOUNTER — Encounter: Payer: Medicare HMO | Attending: Surgery | Admitting: Dietician

## 2023-04-07 ENCOUNTER — Other Ambulatory Visit (HOSPITAL_BASED_OUTPATIENT_CLINIC_OR_DEPARTMENT_OTHER): Payer: Self-pay | Admitting: Surgery

## 2023-04-07 VITALS — Ht 69.5 in | Wt 372.0 lb

## 2023-04-07 DIAGNOSIS — E669 Obesity, unspecified: Secondary | ICD-10-CM | POA: Diagnosis present

## 2023-04-07 DIAGNOSIS — Z713 Dietary counseling and surveillance: Secondary | ICD-10-CM | POA: Diagnosis not present

## 2023-04-07 DIAGNOSIS — Z6841 Body Mass Index (BMI) 40.0 and over, adult: Secondary | ICD-10-CM | POA: Insufficient documentation

## 2023-04-07 DIAGNOSIS — R1084 Generalized abdominal pain: Secondary | ICD-10-CM

## 2023-04-07 NOTE — Progress Notes (Signed)
 Supervised Weight Loss Visit Bariatric Nutrition Education Appt Start Time: 0831    End Time: 0857  Planned surgery: RYGB Pt expectation of surgery: to be at a decent weight, where she feels better.  2 out of 6 SWL Appointments    NUTRITION ASSESSMENT   Anthropometrics  Start weight at [facility]: 398.6 lbs (date: 02/14/2023)  Height: 69.5 in Weight today: 372.0 lbs BMI: 54.15 kg/m2     Clinical   Pharmacotherapy: History of weight loss medication used: n/a  Medical hx: obesity, asthma, seasonal allergies, food allergies, GERD Medications: iron infusions, acetazolamide , adalimumab, albuterol , amlodipine , arnuity ellipta , atropine, azelastine , Azelex, bimatroprost, brimonidine , cetirizine , clinadamycin, dorzolamide -timolol , epinephrine , famotidine , fluticasone , gamunex-C, meloxicam , mometasone , montelukast , mycophenolate, prednisolone  acetate, retin-A  micro, sirolimus, sucralfate . Labs: A1c 5.7; triglycerides 182; HDL 34; LDL 110; Chol/HDL Ratio 5.2 Notable signs/symptoms: none noted Any previous deficiencies? 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 Pt states she had her endoscopy, stating she has been having issues with reflux and vomiting gastric fluids, and will be seeing a gastroenterologist next week. Pt states she is not having issues lately. Pt states she is has been doing better eating her own cooked food. Pt states she is going to therapy again next week, stating she is starting with land and then water the week after.  Estimated daily fluid intake: 64 oz Supplements:  Current average weekly physical activity: ADLs; water aerobics and rehab once a week (planning for 8 weeks)  24-Hr Dietary Recall First Meal: skip or oatmeal with fruit or eggs or grits or strawberries and cottage cheese and boiled egg Snack:  Second Meal: tuna protein crackers Snack: deli meat, cottage cheese Third Meal: tuna and steamed  carrots Snack: pop cycle (outshine zero sugar) Beverages: water with ice, plain water or water with crystal light, un-sweet hot tea  Estimated Energy Needs Calories: 1500  NUTRITION DIAGNOSIS  Overweight/obesity ([place]-3.3) related to past poor dietary habits and physical inactivity as evidenced by patient w/ planned RYGB surgery following dietary guidelines for continued weight loss.  NUTRITION INTERVENTION  Nutrition counseling (C-1) and education (E-2) to facilitate bariatric surgery goals.  Pre-Op Goals Reviewed with the Patient Encouraged patient to honor their body's internal hunger and fullness cues.  Throughout the day, check in mentally and rate hunger. Stop eating when satisfied not full regardless of how much food is left on the plate.  Get more if still hungry 20-30 minutes later.  The key is to honor satisfaction so throughout the meal, rate fullness factor and stop when comfortably satisfied not physically full. The key is to honor hunger and fullness without any feelings of guilt or shame.  Pay attention to what the internal cues are, rather than any external factors. This will enhance the confidence you have in listening to your own body and following those internal cues enabling you to increase how often you eat when you are hungry not out of appetite and stop when you are satisfied not full.  Encouraged pt to continue to eat balanced meals inclusive of non starchy vegetables 2 times a day 7 days a week Encouraged pt to continue to drink a minium 64 fluid ounces with half being plain water to satisfy proper hydration  Why you need complex carbohydrates: Whole grains and other complex carbohydrates are required to have a healthy diet. Whole grains provide fiber which can help with blood glucose levels and help keep you satiated. Fruits and starchy  vegetables provide essential vitamins and minerals required for immune function, eyesight support, brain support, bone density, wound healing  and many other functions within the body. According to the current evidenced based 2020-2025 Dietary Guidelines for Americans, complex carbohydrates are part of a healthy eating pattern which is associated with a decreased risk for type 2 diabetes, cancers, and cardiovascular disease.   Pre-Op Goals Progress & New Goals Continue: to avoid skipping meals; eat every 3-5 hours Continue: avoiding simple carbohydrates; find complex carbohydrate options like whole grains, whole fruits, unprocessed starchy vegetables. Continue: increase non-starchy vegetables, aim for 2 or more servings per day New: Practice not drinking with your meals. New: small frequent scheduled meals and snacks.  Handouts Provided Include  Bariatric MyPlate  Learning Style & Readiness for Change Teaching method utilized: Visual & Auditory  Demonstrated degree of understanding via: Teach Back  Readiness Level: preparation Barriers to learning/adherence to lifestyle change: nothing identified  RD's Notes for Next Visit Patient progress toward chosen goals   MONITORING & EVALUATION Dietary intake, weekly physical activity, body weight, and pre-op goals in 1 month.   Next Steps  Patient is to return to [facility] in 1 month for next SWL visit.

## 2023-04-11 ENCOUNTER — Ambulatory Visit: Payer: Medicare HMO | Admitting: Student

## 2023-04-11 NOTE — Progress Notes (Signed)
 04/12/2023 Tiffany Velasquez [id number] 11-09-1995  Referring provider: Genora Kidd, MD Primary GI doctor: Dr. General Velasquez  ASSESSMENT AND PLAN:     Gastritis with nausea and vomiting Frequent vomiting, predominantly of stomach acid, with associated epigastric pain and burping.  01/07 EGD neg H pylori gastritis Symptoms partially responsive to Omeprazole  and Carafate . -Start Dexilant  in the morning, mother responds well, will send in for patient.  -Continue Carafate  as needed before meals. -Continue Zofran  at night for nausea. -Consider over-the-counter Salonpas patches for discomfort likely MSK from vomiting -Check CT scan scheduled for 04/23/2023 to evaluate liver/GB, likely US  would not be as accurate with BMI.  Obesity Significant weight loss from highest weight of 430 lbs to current weight of 366 lbs. Patient is undergoing bariatric surgery evaluation. -Continue with current weight loss strategies including portion control and exercise. -Continue with plans for gastric bypass surgery.  Fatty Liver Mildly elevated liver function tests, likely secondary to fatty liver. -Continue weight loss strategies as this is the primary treatment for fatty liver. -Review results of upcoming CT scan for further assessment.  Asthma Chronic condition, no acute issues discussed in this visit. -Continue current management plan.  Follow-up -Review results of CT scan. -Continue monitoring liver function tests. -Continue weight loss strategies and preparation for bariatric surgery.     Screening colon age 71  unless patient has symptoms  Patient Care Team: Tiffany Kidd, MD as PCP - Tiffany (Family Medicine) Tiffany Drought, MD as Consulting Physician (Ophthalmology) Tiffany Maker Mirna Amis, MD as Consulting Physician (Allergy  and Immunology) Tiffany Larsen, MD as Referring Physician (Dermatology)  HISTORY OF PRESENT ILLNESS: 28 y.o. female with a past medical history  of hypertension, asthma, morbid obesity, eczema, glaucoma and others listed below presents for evaluation of ER follow-up.   Patient is interested in bariatric surgery. 04/04/23 EGD with Tiffany Olds, MD that showed nodular gastritis. 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  Decreased appetite, nausea, with occ red streaks in vomit Has epigastric pain with burping.  She states omeprazole  40 mg taking once a day, intermittently, was taking her mom's pills. She is on carafate  liquid.  She has vomiting and fatigue.   Discussed the use of AI scribe software for clinical note transcription with the patient, who gave verbal consent to proceed.  History of Present Illness   The patient, with a history of chronic asthma and retinal edema, presents with concerns related to significant weight loss and persistent vomiting. The patient has been on a weight loss journey, including dietary changes and bariatric surgery preparations, which has resulted in a decrease in weight from 430 to 360 pounds. However, the patient has been experiencing frequent vomiting, primarily of liquid and stomach acid, which has led to a fear of eating and subsequent loss of appetite. The vomiting is often triggered in the morning when attempting to clear phlegm from the throat.  The patient describes the discomfort as originating in the upper abdominal region, not lower, and is associated with burping and gas. The patient also reports a sensation of stomach turning prior to vomiting, rather than a sour taste in the mouth. The vomiting episodes have been so frequent that the patient describes feeling sore  and fatigued, with a numb sensation  in the abdominal area.  The patient has been taking omeprazole  twice daily, which has somewhat reduced the frequency of vomiting but not eliminated it entirely. The patient has also been taking sulcarfate, which has been helpful in coating the stomach and reducing discomfort. The patient has been taking Zofran , an anti-nausea medication, which has been helpful in preventing nocturnal vomiting episodes and aiding sleep.  The patient has also been experiencing some back pain and has been using lidocaine  patches for relief. The patient has a history of taking meloxicam  as needed for foot pain, but this has been discontinued. The patient also takes Tylenol  Extra Strength for pain management and has been advised to avoid ibuprofen  due to potential gastric issues.  The patient has undergone an upper endoscopy, which revealed gastritis but no other abnormalities. The patient also receives monthly lab work due to ongoing infusions for retinal edema. The patient's liver function was slightly elevated in the last exam, potentially indicating fatty liver disease.    She  reports that she has never smoked. She has never used smokeless tobacco. She reports current alcohol use. She reports that she does not use drugs.  RELEVANT GI HISTORY, LABS, IMAGING:  EGD 04/04/2023    - Normal esophagus.   - Gastroesophageal flap valve classified as Hill     Grade I (prominent fold, tight to endoscope).    - Nodular gastritis. Biopsied.    - Normal.  CBC    Component Value Date/Time   WBC 4.7 05/09/2019 1150   RBC 4.44 05/09/2019 1150   HGB 12.6 05/09/2019 1150   HGB 12.3 10/05/2017 1115   HCT 41.6 05/09/2019 1150   HCT 40.2 10/05/2017 1115   PLT 309 05/09/2019 1150   PLT 305 10/05/2017 1115   MCV 93.7 05/09/2019 1150   MCV 82 10/05/2017 1115   MCH 28.4 05/09/2019 1150   MCHC 30.3 05/09/2019 1150   RDW 14.2 05/09/2019 1150   RDW 17.2 (H) 10/05/2017 1115   LYMPHSABS 0.9 05/09/2019 1150   LYMPHSABS 1.4  10/05/2017 1115   MONOABS 0.6 05/09/2019 1150   EOSABS 0.0 05/09/2019 1150   EOSABS 0.2 10/05/2017 1115   BASOSABS 0.0 05/09/2019 1150   BASOSABS 0.0 10/05/2017 1115   No results for input(s): "HGB" in the last 8760 hours.  CMP     Component Value Date/Time   NA 136 05/09/2019 1150   K 4.6 05/09/2019 1150   CL 108 05/09/2019 1150   CO2 21 (L) 05/09/2019 1150   GLUCOSE 92 05/09/2019 1150   BUN 10 05/09/2019 1150   CREATININE 0.81 05/09/2019 1150   CALCIUM  8.8 (L) 05/09/2019 1150   PROT 7.5 05/09/2019 1150   ALBUMIN 3.7 05/09/2019 1150   AST 18 05/09/2019 1150   ALT 10 05/09/2019 1150   ALKPHOS 50 05/09/2019 1150   BILITOT 0.9 05/09/2019 1150   GFRNONAA >60 05/09/2019 1150   GFRAA >60 05/09/2019 1150      Latest Ref Rng & Units 05/09/2019   11:50 AM  Hepatic Function  Total Protein 6.5 - 8.1 g/dL 7.5   Albumin 3.5 - 5.0 g/dL 3.7   AST 15 - 41 U/L 18   ALT 0 - 44 U/L 10   Alk Phosphatase 38 - 126 U/L 50   Total Bilirubin 0.3 - 1.2 mg/dL 0.9       Current Medications:    Current Outpatient Medications (Cardiovascular):    acetaZOLAMIDE  (DIAMOX ) 250 MG tablet, Take 250  mg by mouth 2 (two) times daily.   amLODipine  (NORVASC ) 5 MG tablet, Take 1 tablet (5 mg total) by mouth at bedtime.  Current Outpatient Medications (Respiratory):    albuterol  (VENTOLIN  HFA) 108 (90 Base) MCG/ACT inhaler, Inhale 2 puffs every 4-6 hours as needed for cough, wheeze, tightness in chest, or shortness of breath   ARNUITY ELLIPTA  100 MCG/ACT AEPB, Inhale 2 puffs into the lungs in the morning and at bedtime.   azelastine  (ASTELIN ) 0.1 % nasal spray, USE 2 SPRAYS IN EACH NOSTRIL TWICE DAILY AS NEEDED FOR RUNNY NOSE/DRAINAGE DOWN THROAT   budesonide -formoterol  (SYMBICORT ) 160-4.5 MCG/ACT inhaler, Inhale 2 puffs into the lungs in the morning and at bedtime.   fluticasone  (FLONASE ) 50 MCG/ACT nasal spray, Place 1 spray into both nostrils daily.   loratadine  (CLARITIN ) 10 MG tablet, Take 1 tablet  (10 mg total) by mouth daily.   montelukast  (SINGULAIR ) 10 MG tablet, Take 1 tablet (10 mg total) by mouth at bedtime.  Current Outpatient Medications (Analgesics):    Adalimumab 40 MG/0.4ML PNKT, Inject into the skin.   meloxicam  (MOBIC ) 15 MG tablet, TAKE 1 TABLET(15 MG) BY MOUTH DAILY   Current Outpatient Medications (Other):    AZELEX 20 % cream, Apply topically daily.   bimatoprost (LUMIGAN) 0.01 % SOLN, Apply to eye.   brimonidine  (ALPHAGAN ) 0.15 % ophthalmic solution, 1 drop 3 (three) times daily.   clindamycin (CLEOCIN T) 1 % lotion, Apply topically 2 (two) times daily.   dexlansoprazole  (DEXILANT ) 60 MG capsule, Take 1 capsule (60 mg total) by mouth daily.   dorzolamide -timolol  (COSOPT ) 2-0.5 % ophthalmic solution, 1 drop 2 (two) times daily.   famotidine  (PEPCID ) 40 MG tablet, Take 1 tablet (40 mg total) by mouth at bedtime.   GAMUNEX-C 20 GM/200ML SOLN,    mometasone  (ELOCON ) 0.1 % cream, Apply 1 Application topically 2 (two) times daily.   mycophenolate (CELLCEPT) 500 MG tablet, Take by mouth.   ondansetron  (ZOFRAN ) 8 MG tablet, Take 1 tablet (8 mg total) by mouth every 8 (eight) hours as needed for nausea or vomiting.   ondansetron  (ZOFRAN -ODT) 4 MG disintegrating tablet, Take 1 tablet (4 mg total) by mouth every 8 (eight) hours as needed for nausea.   sirolimus (RAPAMUNE) 2 MG tablet, Take by mouth.   RETIN-A  MICRO 0.04 % gel, Apply 1 application  topically daily. (Patient not taking: Reported on 04/12/2023)   sucralfate  (CARAFATE ) 1 GM/10ML suspension, Take 10 mLs (1 g total) by mouth 4 (four) times daily -  with meals and at bedtime.  Medical History:  Past Medical History:  Diagnosis Date   Asthma    Eczema    Glaucoma    Hypertension    Morbid obesity (HCC)    Allergies:  Allergies  Allergen Reactions   Peanut -Containing Drug Products Swelling    Throat swelling   Shellfish Allergy  Swelling    Throat swelling    Apple Juice Rash   Orange Fruit [Citrus] Rash    Peach Flavoring Agent (Non-Screening) Rash   Tomato Rash   Tree Extract Rash     Surgical History:  She  has a past surgical history that includes no past surgery; Cataract extraction; PORTA CATH INSERTION; Esophagogastroduodenoscopy (N/A, 04/04/2023); and biopsy (04/04/2023). Family History:  Her family history includes Allergic rhinitis in her father; Asthma in her mother; Sudden death in her cousin.  REVIEW OF SYSTEMS  : All other systems reviewed and negative except where noted in the History of Present Illness.  PHYSICAL EXAM:  Pulse 100   Ht 5' 9.5" (1.765 m)   Wt (!) 366 lb (166 kg)   LMP 03/15/2023 (Approximate)   BMI 53.27 kg/m  Tiffany Appearance: Well nourished, in no apparent distress. Head:   Normocephalic and atraumatic. Eyes:  sclerae anicteric,conjunctive pink  Respiratory: Respiratory effort normal, BS equal bilaterally without rales, rhonchi, wheezing. Cardio: RRR with no MRGs. Peripheral pulses intact.  Abdomen: Soft,  Obese ,active bowel sounds. mild tenderness in the entire abdomen with positive carnettWithout guarding and Without rebound. No masses. Rectal: Not evaluated Musculoskeletal: Full ROM, Normal gait. Without edema. Skin:  Dry and intact without significant lesions or rashes Neuro: Alert and  oriented x4;  No focal deficits. Psych:  Cooperative. Normal mood and affect.    Edmonia Gottron, PA-C 11:43 AM

## 2023-04-11 NOTE — Progress Notes (Deleted)
    SUBJECTIVE:   CHIEF COMPLAINT / HPI: GI referral   ***  PERTINENT  PMH / PSH: ***  OBJECTIVE:   There were no vitals taken for this visit.  ***  ASSESSMENT/PLAN:   No problem-specific Assessment & Plan notes found for this encounter.     Wendel Lesch, MD [facility]

## 2023-04-12 ENCOUNTER — Encounter: Payer: Self-pay | Admitting: Physician Assistant

## 2023-04-12 ENCOUNTER — Encounter: Payer: Self-pay | Admitting: Obstetrics and Gynecology

## 2023-04-12 ENCOUNTER — Ambulatory Visit: Payer: Medicare HMO | Admitting: Obstetrics and Gynecology

## 2023-04-12 ENCOUNTER — Other Ambulatory Visit (HOSPITAL_COMMUNITY)
Admission: RE | Admit: 2023-04-12 | Discharge: 2023-04-12 | Disposition: A | Discharge status: Home / Self Care | Payer: Medicare HMO | Source: Ambulatory Visit | Attending: Obstetrics and Gynecology | Admitting: Obstetrics and Gynecology

## 2023-04-12 ENCOUNTER — Other Ambulatory Visit: Payer: Self-pay

## 2023-04-12 ENCOUNTER — Ambulatory Visit (INDEPENDENT_AMBULATORY_CARE_PROVIDER_SITE_OTHER): Payer: Medicare HMO | Admitting: Physician Assistant

## 2023-04-12 VITALS — BP 123/86 | HR 120 | Wt 366.0 lb

## 2023-04-12 DIAGNOSIS — R142 Eructation: Secondary | ICD-10-CM

## 2023-04-12 DIAGNOSIS — R7989 Other specified abnormal findings of blood chemistry: Secondary | ICD-10-CM

## 2023-04-12 DIAGNOSIS — R112 Nausea with vomiting, unspecified: Secondary | ICD-10-CM

## 2023-04-12 DIAGNOSIS — Z124 Encounter for screening for malignant neoplasm of cervix: Secondary | ICD-10-CM | POA: Insufficient documentation

## 2023-04-12 DIAGNOSIS — K2970 Gastritis, unspecified, without bleeding: Secondary | ICD-10-CM | POA: Diagnosis not present

## 2023-04-12 DIAGNOSIS — E669 Obesity, unspecified: Secondary | ICD-10-CM

## 2023-04-12 DIAGNOSIS — K760 Fatty (change of) liver, not elsewhere classified: Secondary | ICD-10-CM

## 2023-04-12 DIAGNOSIS — Z01419 Encounter for gynecological examination (general) (routine) without abnormal findings: Secondary | ICD-10-CM | POA: Diagnosis not present

## 2023-04-12 DIAGNOSIS — K219 Gastro-esophageal reflux disease without esophagitis: Secondary | ICD-10-CM

## 2023-04-12 MED ORDER — SUCRALFATE 1 GM/10ML PO SUSP: 1.0000 g | Freq: Three times a day (TID) | ORAL | 0 refills | Status: DC

## 2023-04-12 MED ORDER — DEXLANSOPRAZOLE 60 MG PO CPDR: 60.0000 mg | DELAYED_RELEASE_CAPSULE | Freq: Every day | ORAL | 3 refills | Status: DC

## 2023-04-12 MED ORDER — FAMOTIDINE 40 MG PO TABS: 40.0000 mg | ORAL_TABLET | Freq: Every day | ORAL | 3 refills | Status: DC

## 2023-04-12 MED ORDER — ONDANSETRON HCL 8 MG PO TABS: 8.0000 mg | ORAL_TABLET | Freq: Three times a day (TID) | ORAL | 1 refills | Status: DC | PRN

## 2023-04-12 NOTE — Patient Instructions (Addendum)
 Please take your proton pump inhibitor medication, dexilant  once daily with pepcid  at night  Continue carafate  as needed and zofran  as needed Please take this medication 30 minutes to 1 hour before meals- this makes it more effective.  Avoid spicy and acidic foods Avoid fatty foods Limit your intake of coffee, tea, alcohol, and carbonated drinks Work to maintain a healthy weight Keep the head of the bed elevated at least 3 inches with blocks or a wedge pillow if you are having any nighttime symptoms Stay upright for 2 hours after eating Avoid meals and snacks three to four hours before bedtime  Sending a medication called Carafate , this mechanically coats your stomach. Can cause constipation and darker stools. Take about 30 mins to 1 hour before food and before bed.   Can do tyelnol max 3000 mg a day, salon pas patches are over the counter and voltern gel is topical antiinflammatory that is safe.   Metabolic dysfunction associated seatohepatitis  Now the leading cause of liver failure in the [place] .  It is normally from such risk factors as obesity, diabetes, insulin resistance, high cholesterol, or metabolic syndrome.  The only definitive therapy is weight loss and exercise.   Suggest walking 20-30 mins daily.  Decreasing carbohydrates, increasing veggies.    Fatty Liver Fatty liver is the accumulation of fat in liver cells. It is also called hepatosteatosis or steatohepatitis. It is normal for your liver to contain some fat. If fat is more than 5 to 10% of your liver's weight, you have fatty liver.  There are often no symptoms (problems) for years while damage is still occurring. People often learn about their fatty liver when they have medical tests for other reasons. Fat can damage your liver for years or even decades without causing problems. When it becomes severe, it can cause fatigue, weight loss, weakness, and confusion. This makes you more likely to develop more serious  liver problems. The liver is the largest organ in the body. It does a lot of work and often gives no warning signs when it is sick until late in a disease. The liver has many important jobs including: Breaking down foods. Storing vitamins, iron, and other minerals. Making proteins. Making bile for food digestion. Breaking down many products including medications, alcohol and some poisons.  PROGNOSIS  Fatty liver may cause no damage or it can lead to an inflammation of the liver. This is, called steatohepatitis.  Over time the liver may become scarred and hardened. This condition is called cirrhosis. Cirrhosis is serious and may lead to liver failure or cancer. NASH is one of the leading causes of cirrhosis. About 10-20% of Americans have fatty liver and a smaller 2-5% has NASH.  TREATMENT  Weight loss, fat restriction, and exercise in overweight patients produces inconsistent results but is worth trying. Good control of diabetes may reduce fatty liver. Eat a balanced, healthy diet. Increase your physical activity. There are no medical or surgical treatments for a fatty liver or NASH, but improving your diet and increasing your exercise may help prevent or reverse some of the damage.

## 2023-04-12 NOTE — Progress Notes (Signed)
 Agree with assessment plan as outlined.  If symptoms persist and workup otherwise negative, consider GES.

## 2023-04-14 ENCOUNTER — Encounter: Payer: Self-pay | Admitting: Obstetrics and Gynecology

## 2023-04-14 LAB — CYTOLOGY - PAP
Adequacy: ABSENT
Diagnosis: NEGATIVE

## 2023-04-14 NOTE — Progress Notes (Signed)
ANNUAL EXAM Patient name: Tiffany Velasquez MRN [id number]  Date of birth: 09-Feb-1996 Chief Complaint:   Gynecologic Exam (/)  History of Present Illness:   Tiffany Velasquez is a 28 y.o. No obstetric history on file. being seen today for a routine annual exam.  Current complaints: annual  Menstrual concerns? No  regular monthly menses Breast or nipple changes? No  Contraception use? Yes abstinence Sexually active? No denies any sexual contact, uses only pads for menstrual concern   Patient's last menstrual period was 03/15/2023 (approximate).   The pregnancy intention screening data noted above was reviewed. Potential methods of contraception were discussed. The patient elected to proceed with No data recorded.   Last pap No results found for: "DIAGPAP", "HPVHIGH", "ADEQPAP"   H/O abnormal pap: no Last mammogram: n/a.  Last colonoscopy: n/a.      04/13/2023    9:29 AM 02/14/2023    9:18 AM 12/09/2022   10:10 AM 11/18/2022    7:31 PM 08/19/2021   10:28 AM  Depression screen PHQ 2/9  Decreased Interest 0 0 0 0 0  Down, Depressed, Hopeless 0 0 0 0 0  PHQ - 2 Score 0 0 0 0 0  Altered sleeping 0  0  0  Tired, decreased energy 1  0  0  Change in appetite 1  0  0  Feeling bad or failure about yourself  0  0  0  Trouble concentrating 0  0  0  Moving slowly or fidgety/restless 0  0  0  Suicidal thoughts 0  0  0  PHQ-9 Score 2  0  0  Difficult doing work/chores     Not difficult at all        04/13/2023    9:29 AM  GAD 7 : Generalized Anxiety Score  Nervous, Anxious, on Edge 2  Control/stop worrying 0  Worry too much - different things 0  Trouble relaxing 0  Restless 0  Easily annoyed or irritable 0  Afraid - awful might happen 0  Total GAD 7 Score 2     Review of Systems:   Pertinent items are noted in HPI Denies any headaches, blurred vision, fatigue, shortness of breath, chest pain, abdominal pain, abnormal vaginal discharge/itching/odor/irritation, problems with  periods, bowel movements, urination, or intercourse unless otherwise stated above. Pertinent History Reviewed:  Reviewed past medical,surgical, social and family history.  Reviewed problem list, medications and allergies. Physical Assessment:   Vitals:   04/12/23 1421  BP: 123/86  Pulse: (!) 120  Weight: (!) 366 lb (166 kg)  Body mass index is 53.27 kg/m.        Physical Examination:   General appearance - well appearing, and in no distress  Mental status - alert, oriented to person, place, and time  Psych:  She has a normal mood and affect  Skin - warm and dry, normal color, no suspicious lesions noted  Chest - effort normal, all lung fields clear to auscultation bilaterally  Heart - normal rate and regular rhythm  Abdomen - soft, nontender, nondistended, no masses or organomegaly  Pelvic -  VULVA: normal appearing vulva with no masses, tenderness or lesions   VAGINA: normal appearing vagina with normal color and discharge, no lesions   CERVIX: normal appearing cervix without discharge or lesions, no CMT  Thin prep pap is done with HR HPV cotesting  UTERUS: uterus is felt to be normal size, shape, consistency and nontender   ADNEXA: No adnexal masses  or tenderness noted.  Extremities:  No swelling or varicosities noted  Chaperone present for exam  No results found for this or any previous visit (from the past 24 hours).    Assessment & Plan:  1. Well woman exam with routine gynecological exam (Primary) - Cervical cancer screening: Discussed screening Q3 years. Reviewed importance of annual exams and limits of pap smear. Pap with reflex HPV collected - GC/CT: Discussed and recommended. Pt   not indicated - Gardasil: completed - Birth Control:  abstinence - Breast Health: Encouraged self breast awareness/exams.  - Follow-up: 12 months and prn - Cytology - PAP( [facility])  2. Screening for cervical cancer - Cytology - PAP( [facility])  No orders of the defined types  were placed in this encounter.   Meds: No orders of the defined types were placed in this encounter.   Follow-up: No follow-ups on file.  Lorriane Shire, MD 04/14/2023 1:57 PM

## 2023-04-17 ENCOUNTER — Telehealth: Payer: Self-pay | Admitting: *Deleted

## 2023-04-17 ENCOUNTER — Emergency Department (HOSPITAL_COMMUNITY)
Admission: EM | Admit: 2023-04-17 | Discharge: 2023-04-17 | Disposition: A | Discharge status: Home / Self Care | Payer: Medicare HMO | Attending: Emergency Medicine | Admitting: Emergency Medicine

## 2023-04-17 ENCOUNTER — Encounter (HOSPITAL_COMMUNITY): Payer: Self-pay

## 2023-04-17 ENCOUNTER — Emergency Department (HOSPITAL_COMMUNITY): Payer: Medicare HMO

## 2023-04-17 DIAGNOSIS — R197 Diarrhea, unspecified: Secondary | ICD-10-CM | POA: Diagnosis not present

## 2023-04-17 DIAGNOSIS — Z91010 Allergy to peanuts: Secondary | ICD-10-CM | POA: Insufficient documentation

## 2023-04-17 DIAGNOSIS — J45909 Unspecified asthma, uncomplicated: Secondary | ICD-10-CM | POA: Insufficient documentation

## 2023-04-17 DIAGNOSIS — R000 Tachycardia, unspecified: Secondary | ICD-10-CM | POA: Diagnosis not present

## 2023-04-17 DIAGNOSIS — E876 Hypokalemia: Secondary | ICD-10-CM | POA: Diagnosis not present

## 2023-04-17 DIAGNOSIS — R112 Nausea with vomiting, unspecified: Secondary | ICD-10-CM | POA: Insufficient documentation

## 2023-04-17 DIAGNOSIS — I10 Essential (primary) hypertension: Secondary | ICD-10-CM | POA: Diagnosis not present

## 2023-04-17 DIAGNOSIS — R109 Unspecified abdominal pain: Secondary | ICD-10-CM | POA: Diagnosis not present

## 2023-04-17 DIAGNOSIS — R1084 Generalized abdominal pain: Secondary | ICD-10-CM | POA: Diagnosis not present

## 2023-04-17 LAB — CBC
HCT: 36.6 % (ref 36.0–46.0)
Hemoglobin: 11.4 g/dL — ABNORMAL LOW (ref 12.0–15.0)
MCH: 24.2 pg — ABNORMAL LOW (ref 26.0–34.0)
MCHC: 31.1 g/dL (ref 30.0–36.0)
MCV: 77.5 fL — ABNORMAL LOW (ref 80.0–100.0)
Platelets: 207 10*3/uL (ref 150–400)
RBC: 4.72 MIL/uL (ref 3.87–5.11)
RDW: 21.7 % — ABNORMAL HIGH (ref 11.5–15.5)
WBC: 7 10*3/uL (ref 4.0–10.5)
nRBC: 0.3 % — ABNORMAL HIGH (ref 0.0–0.2)

## 2023-04-17 LAB — HCG, SERUM, QUALITATIVE: Preg, Serum: NEGATIVE

## 2023-04-17 LAB — COMPREHENSIVE METABOLIC PANEL
ALT: 29 U/L (ref 0–44)
AST: 69 U/L — ABNORMAL HIGH (ref 15–41)
Albumin: 3.5 g/dL (ref 3.5–5.0)
Alkaline Phosphatase: 44 U/L (ref 38–126)
Anion gap: 16 — ABNORMAL HIGH (ref 5–15)
BUN: 7 mg/dL (ref 6–20)
CO2: 14 mmol/L — ABNORMAL LOW (ref 22–32)
Calcium: 9.1 mg/dL (ref 8.9–10.3)
Chloride: 107 mmol/L (ref 98–111)
Creatinine, Ser: 0.86 mg/dL (ref 0.44–1.00)
GFR, Estimated: >60 mL/min (ref >60)
Glucose, Bld: 83 mg/dL (ref 70–99)
Potassium: 2.8 mmol/L — ABNORMAL LOW (ref 3.5–5.1)
Sodium: 137 mmol/L (ref 135–145)
Total Bilirubin: 0.9 mg/dL (ref 0.0–1.2)
Total Protein: 9.3 g/dL — ABNORMAL HIGH (ref 6.5–8.1)

## 2023-04-17 LAB — LIPASE, BLOOD: Lipase: 45 U/L (ref 11–51)

## 2023-04-17 LAB — MAGNESIUM: Magnesium: 2 mg/dL (ref 1.7–2.4)

## 2023-04-17 MED ORDER — MORPHINE SULFATE (PF) 4 MG/ML IV SOLN
4.0000 mg | Freq: Once | INTRAVENOUS | Status: AC
  Administered 2023-04-17: 4 mg via INTRAVENOUS
  Filled 2023-04-17: qty 1

## 2023-04-17 MED ORDER — LACTATED RINGERS IV BOLUS
1000.0000 mL | Freq: Once | INTRAVENOUS | Status: AC
  Administered 2023-04-17: 1000 mL via INTRAVENOUS

## 2023-04-17 MED ORDER — METOCLOPRAMIDE HCL 10 MG PO TABS: 10.0000 mg | ORAL_TABLET | Freq: Three times a day (TID) | ORAL | 0 refills | Status: DC | PRN

## 2023-04-17 MED ORDER — HEPARIN SOD (PORK) LOCK FLUSH 100 UNIT/ML IV SOLN
500.0000 [IU] | Freq: Once | INTRAVENOUS | Status: AC
  Administered 2023-04-17: 500 [IU]
  Filled 2023-04-17: qty 5

## 2023-04-17 MED ORDER — POTASSIUM CHLORIDE CRYS ER 20 MEQ PO TBCR
40.0000 meq | EXTENDED_RELEASE_TABLET | Freq: Once | ORAL | Status: AC
Start: 2023-04-17 — End: 2023-04-17
  Administered 2023-04-17: 40 meq via ORAL
  Filled 2023-04-17: qty 2

## 2023-04-17 MED ORDER — POTASSIUM CHLORIDE CRYS ER 20 MEQ PO TBCR: 20.0000 meq | EXTENDED_RELEASE_TABLET | Freq: Two times a day (BID) | ORAL | 0 refills | Status: DC

## 2023-04-17 MED ORDER — POTASSIUM CHLORIDE 10 MEQ/100ML IV SOLN
10.0000 meq | INTRAVENOUS | Status: DC
  Administered 2023-04-17: 10 meq via INTRAVENOUS
  Filled 2023-04-17: qty 100

## 2023-04-17 MED ORDER — METOCLOPRAMIDE HCL 5 MG/ML IJ SOLN
10.0000 mg | Freq: Once | INTRAMUSCULAR | Status: AC
  Administered 2023-04-17: 10 mg via INTRAVENOUS
  Filled 2023-04-17: qty 2

## 2023-04-17 MED ORDER — IOHEXOL 350 MG/ML SOLN
100.0000 mL | Freq: Once | INTRAVENOUS | Status: AC | PRN
  Administered 2023-04-17: 100 mL via INTRAVENOUS

## 2023-04-17 NOTE — ED Triage Notes (Addendum)
There is a report of her syncopizing at home, she mentions this has been an ongoing issue for around 2 weeks she is supposed ot have a CT scan on Wednesday. Her surgeon has done a endoscopy but is doing the CT scan. She states she is nauseated, vomiting, and have the generalized abd pain

## 2023-04-17 NOTE — ED Provider Notes (Signed)
[place] EMERGENCY DEPARTMENT AT [facility] Provider Note   CSN: [id number] Arrival date & time: 04/17/23  1643     History  Chief Complaint  Patient presents with   Abdominal Pain    Tiffany Velasquez is a 28 y.o. female.   Abdominal Pain Associated symptoms: diarrhea, nausea and vomiting   Patient presents for abdominal pain.  Medical history includes obesity, HTN, asthma, eczema, GERD.  For the past several weeks, she has been experiencing abdominal pain, and frequent nausea and vomiting.  Last week, she was seen by OB/GYN and GI.  She underwent a recent endoscopy which showed gastritis.  Per note from GI visit last week, symptoms have reportedly improved with omeprazole and Carafate.  She takes Zofran as needed for nausea.  She has a CT scan scheduled for next week to evaluate liver and gallbladder.  Patient reports worsening symptoms.  At approximately 4 PM this afternoon, she stood up and experienced worsened pain.  This caused her to have a syncopal episode.  She denies any prior episodes of syncope.  For her pain, patient uses Tylenol and lidocaine patches on her abdomen.  She is worried about her worsening symptoms and does not feel that she can wait till the end of the week to get CT scan.  She describes pain as generalized, worse in the upper aspect of abdomen.  It is not greater on 1 side or the other.  She has been having recent diarrhea.  She describes diarrhea as nonbloody.  Emesis will have streaks of blood occasionally.  This has not changed since her recent endoscopy.  LMP was a month ago.  Patient denies sexual activity.     Home Medications Prior to Admission medications   Medication Sig Start Date End Date Taking? Authorizing Provider  metoCLOPramide (REGLAN) 10 MG tablet Take 1 tablet (10 mg total) by mouth every 8 (eight) hours as needed for nausea. 04/17/23  Yes Gloris Manchester, MD  potassium chloride SA (KLOR-CON M) 20 MEQ tablet Take 1 tablet (20 mEq total)  by mouth 2 (two) times daily for 3 days. 04/17/23 04/20/23 Yes Gloris Manchester, MD  acetaZOLAMIDE (DIAMOX) 250 MG tablet Take 250 mg by mouth 2 (two) times daily. 01/17/23   [provider]  Adalimumab 40 MG/0.4ML PNKT Inject into the skin. 03/03/20   [provider]  albuterol (VENTOLIN HFA) 108 (90 Base) MCG/ACT inhaler Inhale 2 puffs every 4-6 hours as needed for cough, wheeze, tightness in chest, or shortness of breath 11/07/22   Ambs, Norvel Richards, FNP  amLODipine (NORVASC) 5 MG tablet Take 1 tablet (5 mg total) by mouth at bedtime. 12/20/22   McDiarmid, Leighton Roach, MD  ARNUITY ELLIPTA 100 MCG/ACT AEPB Inhale 2 puffs into the lungs in the morning and at bedtime. 01/13/23   [provider]  azelastine (ASTELIN) 0.1 % nasal spray USE 2 SPRAYS IN EACH NOSTRIL TWICE DAILY AS NEEDED FOR RUNNY NOSE/DRAINAGE DOWN THROAT 11/23/22   Alfonse Spruce, MD  AZELEX 20 % cream Apply topically daily. 06/28/21   [provider]  bimatoprost (LUMIGAN) 0.01 % SOLN Apply to eye. 11/01/22   [provider]  brimonidine (ALPHAGAN) 0.15 % ophthalmic solution 1 drop 3 (three) times daily. 02/01/20   [provider]  budesonide-formoterol (SYMBICORT) 160-4.5 MCG/ACT inhaler Inhale 2 puffs into the lungs in the morning and at bedtime. 11/23/22   Alfonse Spruce, MD  clindamycin (CLEOCIN T) 1 % lotion Apply topically 2 (two) times  daily. 08/17/15   [provider]  dexlansoprazole (DEXILANT) 60 MG capsule Take 1 capsule (60 mg total) by mouth daily. 04/12/23   Doree Albee, PA-C  dorzolamide-timolol (COSOPT) 2-0.5 % ophthalmic solution 1 drop 2 (two) times daily.    [provider]  famotidine (PEPCID) 40 MG tablet Take 1 tablet (40 mg total) by mouth at bedtime. 04/12/23   Doree Albee, PA-C  fluticasone (FLONASE) 50 MCG/ACT nasal spray Place 1 spray into both nostrils daily. 11/07/22   Hetty Blend, FNP  GAMUNEX-C 20 GM/200ML SOLN  05/20/22   [provider]  loratadine (CLARITIN) 10 MG tablet Take 1 tablet (10 mg total) by mouth daily. 03/08/23   Alfredo Martinez, MD  meloxicam (MOBIC) 15 MG tablet TAKE 1 TABLET(15 MG) BY MOUTH DAILY 02/22/23   McDonald, Adam R, DPM  mometasone (ELOCON) 0.1 % cream Apply 1 Application topically 2 (two) times daily. 11/23/22   Alfonse Spruce, MD  montelukast (SINGULAIR) 10 MG tablet Take 1 tablet (10 mg total) by mouth at bedtime. 11/23/22   Alfonse Spruce, MD  mycophenolate (CELLCEPT) 500 MG tablet Take by mouth. 06/22/21   [provider]  ondansetron (ZOFRAN) 8 MG tablet Take 1 tablet (8 mg total) by mouth every 8 (eight) hours as needed for nausea or vomiting. 04/12/23   Doree Albee, PA-C  ondansetron (ZOFRAN-ODT) 4 MG disintegrating tablet Take 1 tablet (4 mg total) by mouth every 8 (eight) hours as needed for nausea. 03/30/23   Levin Erp, MD  RETIN-A MICRO 0.04 % gel Apply 1 application  topically daily. 06/28/21   [provider]  sirolimus (RAPAMUNE) 2 MG tablet Take by mouth. 05/13/21   [provider]  sucralfate (CARAFATE) 1 GM/10ML suspension Take 10 mLs (1 g total) by mouth 4 (four) times daily -  with meals and at bedtime. 04/12/23   Doree Albee, PA-C      Allergies    Other, Peanut-containing drug products, Shellfish allergy, Apple juice, Orange fruit [citrus], Peach flavoring agent (non-screening), Tomato, and Tree extract    Review of Systems   Review of Systems  Gastrointestinal:  Positive for abdominal pain, diarrhea, nausea and vomiting.  All other systems reviewed and are negative.   Physical Exam Updated Vital Signs BP (!) 143/94   Pulse 95   Temp 98.5 F (36.9 C)   Resp 12   LMP 03/15/2023 (Approximate)   SpO2 100%  Physical Exam Vitals and nursing note reviewed.  Constitutional:      General: She is not in acute distress.    Appearance: She is well-developed. She is not ill-appearing, toxic-appearing or diaphoretic.   HENT:     Head: Normocephalic and atraumatic.     Mouth/Throat:     Mouth: Mucous membranes are moist.  Eyes:     Extraocular Movements: Extraocular movements intact.     Conjunctiva/sclera: Conjunctivae normal.  Cardiovascular:     Rate and Rhythm: Regular rhythm. Tachycardia present.  Pulmonary:     Effort: Pulmonary effort is normal. No respiratory distress.  Abdominal:     Palpations: Abdomen is soft.     Tenderness: There is generalized abdominal tenderness. There is no guarding or rebound.  Musculoskeletal:        General: No swelling.     Cervical back: Neck supple.  Skin:    General: Skin is warm and dry.     Coloration: Skin is not cyanotic or jaundiced.  Neurological:  General: No focal deficit present.     Mental Status: She is alert and oriented to person, place, and time.  Psychiatric:        Mood and Affect: Mood normal.        Behavior: Behavior normal.     ED Results / Procedures / Treatments   Labs (all labs ordered are listed, but only abnormal results are displayed) Labs Reviewed  CBC - Abnormal; Notable for the following components:      Result Value   Hemoglobin 11.4 (*)    MCV 77.5 (*)    MCH 24.2 (*)    RDW 21.7 (*)    nRBC 0.3 (*)    All other components within normal limits  COMPREHENSIVE METABOLIC PANEL - Abnormal; Notable for the following components:   Potassium 2.8 (*)    CO2 14 (*)    Total Protein 9.3 (*)    AST 69 (*)    Anion gap 16 (*)    All other components within normal limits  HCG, SERUM, QUALITATIVE  LIPASE, BLOOD  MAGNESIUM  URINALYSIS, ROUTINE W REFLEX MICROSCOPIC    EKG EKG Interpretation Date/Time:  Monday April 17 2023 17:32:46 EST Ventricular Rate:  108 PR Interval:  156 QRS Duration:  88 QT Interval:  350 QTC Calculation: 469 R Axis:   -29  Text Interpretation: Sinus tachycardia Confirmed by Gloris Manchester ([id number]) on 04/17/2023 5:46:16 PM  Radiology CT ABDOMEN PELVIS W CONTRAST Result Date:  04/17/2023 CLINICAL DATA:  Abdominal pain for 2 weeks, nausea and vomiting EXAM: CT ABDOMEN AND PELVIS WITH CONTRAST TECHNIQUE: Multidetector CT imaging of the abdomen and pelvis was performed using the standard protocol following bolus administration of intravenous contrast. RADIATION DOSE REDUCTION: This exam was performed according to the departmental dose-optimization program which includes automated exposure control, adjustment of the mA and/or kV according to patient size and/or use of iterative reconstruction technique. CONTRAST:  OMNIPAQUE IOHEXOL 350 MG/ML SOLN COMPARISON:  None Available. FINDINGS: Lower chest: Patchy peripheral subpleural consolidation at the left lateral costophrenic angle may reflect focal airspace disease or atelectasis. No pleural effusion. Hepatobiliary: No focal liver abnormality is seen. No gallstones, gallbladder wall thickening, or biliary dilatation. Pancreas: Unremarkable. No pancreatic ductal dilatation or surrounding inflammatory changes. Spleen: Normal in size without focal abnormality. Adrenals/Urinary Tract: Adrenal glands are unremarkable. Kidneys are normal, without renal calculi, focal lesion, or hydronephrosis. Bladder is unremarkable. Stomach/Bowel: No bowel obstruction or ileus. Normal appendix right lower quadrant. No bowel wall thickening or inflammatory change. Vascular/Lymphatic: No significant vascular findings are present. No enlarged abdominal or pelvic lymph nodes. Reproductive: Uterus and bilateral adnexa are unremarkable. Other: No free fluid or free intraperitoneal gas. No abdominal wall hernia. Musculoskeletal: No acute or destructive bony abnormalities. Reconstructed images demonstrate no additional findings. IMPRESSION: 1. Patchy consolidation within the left lower lobe, which may reflect hypoventilatory change or focal pneumonia. 2. No acute intra-abdominal or intrapelvic process. Electronically Signed   By: Sharlet Salina M.D.   On: 04/17/2023  22:06    Procedures Procedures    Medications Ordered in ED Medications  potassium chloride 10 mEq in 100 mL IVPB (10 mEq Intravenous New Bag/Given 04/17/23 2200)  metoCLOPramide (REGLAN) injection 10 mg (10 mg Intravenous Given 04/17/23 2000)  lactated ringers bolus 1,000 mL (1,000 mLs Intravenous New Bag/Given 04/17/23 2000)  morphine (PF) 4 MG/ML injection 4 mg (4 mg Intravenous Given 04/17/23 2000)  potassium chloride SA (KLOR-CON M) CR tablet 40 mEq (40 mEq Oral Given 04/17/23 2200)  iohexol (OMNIPAQUE) 350 MG/ML injection 100 mL (100 mLs Intravenous Contrast Given 04/17/23 2154)    ED Course/ Medical Decision Making/ A&P                                 Medical Decision Making Amount and/or Complexity of Data Reviewed Labs: ordered. Radiology: ordered.  Risk Prescription drug management.   This patient presents to the ED for concern of abdominal pain, nausea, vomiting, this involves an extensive number of treatment options, and is a complaint that carries with it a high risk of complications and morbidity.  The differential diagnosis includes gastritis, enteritis, gastroparesis, cholelithiasis, cholecystitis, pancreatitis   Co morbidities that complicate the patient evaluation  obesity, HTN, asthma, eczema, GERD   Additional history obtained:  Additional history obtained from patient's mother External records from outside source obtained and reviewed including EMR   Lab Tests:  I Ordered, and personally interpreted labs.  The pertinent results include: Hypokalemia is present.  Electrolytes are otherwise normal.  There is a anion gap metabolic acidosis, likely secondary to starvation ketosis.  No leukocytosis is present.   Imaging Studies ordered:  I ordered imaging studies including CT of abdomen and pelvis I independently visualized and interpreted imaging which showed no acute findings.  Patchy consolidation of left lower lobe likely secondary to atelectasis given  patient's absence of recent respiratory symptoms. I agree with the radiologist interpretation   Cardiac Monitoring: / EKG:  The patient was maintained on a cardiac monitor.  I personally viewed and interpreted the cardiac monitored which showed an underlying rhythm of: Sinus rhythm  Problem List / ED Course / Critical interventions / Medication management  Patient presenting for abdominal pain, nausea, and vomiting.  This has been ongoing over the past several weeks but patient feels that it is worsening.  On arrival in the ED, she is anxious and tearful.  Vital signs are notable for tachycardia.  Her abdomen is soft but she does notice a generalized tenderness.  Percocet and Reglan were ordered for symptomatic relief.  Workup was initiated.  Lab work was notable for hypokalemia.  Replacement potassium was ordered.  She has a anion gap metabolic acidosis which I suspect is secondary to starvation ketosis.  Patient does have frequent vomiting and has had recent rapid weight loss.  On CT scan, no acute intra-abdominal findings were identified.  There were findings of patchy opacity in left lower lobe of lung.  I suspect this is secondary to atelectasis given patient's absence of respiratory symptoms.  Symptoms did improve following IV fluids, morphine, and Reglan.  Patient does describe some episodes where she will vomit a meal that she has eaten 6 hours prior.  This may suggest a component of gastroparesis.  Patient was advised to continue her PPI and Carafate for treatment of known gastritis.  She was prescribed Reglan to take as needed for possible gastroparesis component.  She was prescribed a few more days of potassium supplement as well.  Patient to follow-up with her outpatient providers.  She was discharged in stable condition. I ordered medication including IV fluids for hydration; morphine for analgesia; Reglan for nausea; potassium chloride for hypokalemia Reevaluation of the patient after these  medicines showed that the patient improved I have reviewed the patients home medicines and have made adjustments as needed   Social Determinants of Health:  Has access to outpatient care  Final Clinical Impression(s) / ED Diagnoses Final diagnoses:  Nausea and vomiting, unspecified vomiting type  Hypokalemia    Rx / DC Orders ED Discharge Orders          Ordered    metoCLOPramide (REGLAN) 10 MG tablet  Every 8 hours PRN        04/17/23 2230    potassium chloride SA (KLOR-CON M) 20 MEQ tablet  2 times daily        04/17/23 2230              Gloris Manchester, MD 04/17/23 2231

## 2023-04-17 NOTE — Discharge Instructions (Addendum)
A prescription for medication called metoclopramide was sent to your pharmacy.  Take this as needed for nausea.  Your potassium was low today.  We replaced most of it while you are here in the emergency department.  A prescription for an additional 3 days of potassium supplement was also sent to your pharmacy.  Take as prescribed.  Continue to follow-up with your outpatient providers.  Return to the emergency department for any new or worsening symptoms of concern.

## 2023-04-17 NOTE — Telephone Encounter (Addendum)
-----   Message from Lorriane Shire sent at 04/14/2023  4:42 PM EST ----- Notify of normal ( pap)

## 2023-04-17 NOTE — ED Notes (Signed)
Phlebotomy was unsuccessful in getting labs from pt.

## 2023-04-17 NOTE — Telephone Encounter (Signed)
I called Tiffany Velasquez and left a message I am calling with non urgent results. You may review a Mychart message that was sent or call us back during office hours. Will leave in in box for staff to follow up. Nancy Fetter

## 2023-04-19 ENCOUNTER — Encounter (HOSPITAL_COMMUNITY): Payer: Self-pay

## 2023-04-19 ENCOUNTER — Ambulatory Visit (HOSPITAL_COMMUNITY): Payer: Medicare HMO

## 2023-04-19 ENCOUNTER — Emergency Department (HOSPITAL_COMMUNITY): Payer: Medicare HMO

## 2023-04-19 ENCOUNTER — Encounter (HOSPITAL_COMMUNITY): Payer: Self-pay | Admitting: Emergency Medicine

## 2023-04-19 ENCOUNTER — Emergency Department (HOSPITAL_COMMUNITY)
Admission: EM | Admit: 2023-04-19 | Discharge: 2023-04-19 | Disposition: A | Discharge status: Home / Self Care | Payer: Medicare HMO | Attending: Emergency Medicine | Admitting: Emergency Medicine

## 2023-04-19 ENCOUNTER — Other Ambulatory Visit: Payer: Self-pay

## 2023-04-19 DIAGNOSIS — J181 Lobar pneumonia, unspecified organism: Secondary | ICD-10-CM | POA: Insufficient documentation

## 2023-04-19 DIAGNOSIS — J168 Pneumonia due to other specified infectious organisms: Secondary | ICD-10-CM | POA: Diagnosis not present

## 2023-04-19 DIAGNOSIS — Z91010 Allergy to peanuts: Secondary | ICD-10-CM | POA: Insufficient documentation

## 2023-04-19 DIAGNOSIS — R059 Cough, unspecified: Secondary | ICD-10-CM | POA: Diagnosis not present

## 2023-04-19 DIAGNOSIS — I10 Essential (primary) hypertension: Secondary | ICD-10-CM | POA: Insufficient documentation

## 2023-04-19 DIAGNOSIS — J45909 Unspecified asthma, uncomplicated: Secondary | ICD-10-CM | POA: Insufficient documentation

## 2023-04-19 DIAGNOSIS — R1110 Vomiting, unspecified: Secondary | ICD-10-CM | POA: Diagnosis not present

## 2023-04-19 DIAGNOSIS — Z79899 Other long term (current) drug therapy: Secondary | ICD-10-CM | POA: Diagnosis not present

## 2023-04-19 DIAGNOSIS — J189 Pneumonia, unspecified organism: Secondary | ICD-10-CM

## 2023-04-19 MED ORDER — AZITHROMYCIN 250 MG PO TABS: 250.0000 mg | ORAL_TABLET | Freq: Every day | ORAL | 0 refills | Status: DC

## 2023-04-19 MED ORDER — AMOXICILLIN-POT CLAVULANATE 875-125 MG PO TABS: 1.0000 | ORAL_TABLET | Freq: Two times a day (BID) | ORAL | 0 refills | Status: AC

## 2023-04-19 MED ORDER — ALBUTEROL SULFATE (2.5 MG/3ML) 0.083% IN NEBU: 2.5000 mg | INHALATION_SOLUTION | RESPIRATORY_TRACT | Status: DC | PRN

## 2023-04-19 NOTE — ED Provider Triage Note (Addendum)
Emergency Medicine Provider Triage Evaluation Note  Tiffany Velasquez , a 28 y.o. female  was evaluated in triage.  Pt complains of SOB, Cough.  Review of Systems  Positive: As above, subjective fever Negative: Abdominal pain  Physical Exam  BP (!) 129/96 (BP Location: Right Arm)   Pulse (!) 112   Temp 98.7 F (37.1 C) (Oral)   Resp 16   Ht 5' 9.5" (1.765 m)   Wt (!) 166 kg   LMP 03/15/2023 (Approximate)   SpO2 99%   BMI 53.27 kg/m  Gen:   Awake, no distress  Tearful Resp:  Normal effort CTA MSK:   Moves extremities without difficulty  Other:    Medical Decision Making  Medically screening exam initiated at 12:59 PM.  Appropriate orders placed.  Tiffany Velasquez was informed that the remainder of the evaluation will be completed by another provider, this initial triage assessment does not replace that evaluation, and the importance of remaining in the ED until their evaluation is complete.  Here 1/20 for abdo pain. On CT, ?LLL PNA vs atx (no resp sxs at the time). Now respiratory symptoms worse.  Remains tachycardic as in previous visit - consider PE r/o   Tiffany Anis, PA-C 04/19/23 1302    Tiffany Anis, PA-C 04/19/23 1303

## 2023-04-19 NOTE — ED Notes (Signed)
Pt ambulated with pulse ox, stayed at 100% RA

## 2023-04-19 NOTE — Discharge Instructions (Addendum)
Please follow-up with your primary care provider regards to recent ER visit.  Today you were treated for pneumonia but you need to follow-up with your primary care provider.  Please use your butyryl inhaler as prescribed and if symptoms change or worsen please return to the ER.  Please also note that antibiotics do have an effect on birth control and may make your birth control less effective. Please use extra protection until your antibiotic course is finished.

## 2023-04-19 NOTE — ED Provider Notes (Signed)
[place] EMERGENCY DEPARTMENT AT [facility] Provider Note   CSN: [id number] Arrival date & time: 04/19/23  1141     History  Chief Complaint  Patient presents with   Emesis   Cough    Tiffany Velasquez is a 28 y.o. female history of hypertension, asthma presented for a few days of generalized abdominal pain and productive cough.  Patient was seen 2 days ago and had a CT scan done that shows left lower lobe pneumonia and at that time states she was unaware of the productive cough being and pneumonia symptoms and was ultimately discharged without antibiotics.  Patient came back that she states she still has productive cough and feels short of breath where she is using her albuterol inhaler a little more than normal.  Patient denies any chest pain or vomiting states that she has Reglan at home which has been helping and that she is not concerned about her abdomen she is more concerned about the productive cough.    Home Medications Prior to Admission medications   Medication Sig Start Date End Date Taking? Authorizing Provider  amoxicillin-clavulanate (AUGMENTIN) 875-125 MG tablet Take 1 tablet by mouth every 12 (twelve) hours for 5 days. 04/19/23 04/24/23 Yes Elzy Tomasello, Beverly Gust, PA-C  azithromycin (ZITHROMAX) 250 MG tablet Take 1 tablet (250 mg total) by mouth daily. Take first 2 tablets together, then 1 every day until finished. 04/19/23  Yes Amelio Brosky, Beverly Gust, PA-C  acetaZOLAMIDE (DIAMOX) 250 MG tablet Take 250 mg by mouth 2 (two) times daily. 01/17/23   [provider]  Adalimumab 40 MG/0.4ML PNKT Inject into the skin. 03/03/20   [provider]  albuterol (VENTOLIN HFA) 108 (90 Base) MCG/ACT inhaler Inhale 2 puffs every 4-6 hours as needed for cough, wheeze, tightness in chest, or shortness of breath 11/07/22   Ambs, Norvel Richards, FNP  amLODipine (NORVASC) 5 MG tablet Take 1 tablet (5 mg total) by mouth at bedtime. 12/20/22   McDiarmid, Leighton Roach, MD  ARNUITY ELLIPTA 100  MCG/ACT AEPB Inhale 2 puffs into the lungs in the morning and at bedtime. 01/13/23   [provider]  azelastine (ASTELIN) 0.1 % nasal spray USE 2 SPRAYS IN EACH NOSTRIL TWICE DAILY AS NEEDED FOR RUNNY NOSE/DRAINAGE DOWN THROAT 11/23/22   Alfonse Spruce, MD  AZELEX 20 % cream Apply topically daily. 06/28/21   [provider]  bimatoprost (LUMIGAN) 0.01 % SOLN Apply to eye. 11/01/22   [provider]  brimonidine (ALPHAGAN) 0.15 % ophthalmic solution 1 drop 3 (three) times daily. 02/01/20   [provider]  budesonide-formoterol (SYMBICORT) 160-4.5 MCG/ACT inhaler Inhale 2 puffs into the lungs in the morning and at bedtime. 11/23/22   Alfonse Spruce, MD  clindamycin (CLEOCIN T) 1 % lotion Apply topically 2 (two) times daily. 08/17/15   [provider]  dexlansoprazole (DEXILANT) 60 MG capsule Take 1 capsule (60 mg total) by mouth daily. 04/12/23   Doree Albee, PA-C  dorzolamide-timolol (COSOPT) 2-0.5 % ophthalmic solution 1 drop 2 (two) times daily.    [provider]  famotidine (PEPCID) 40 MG tablet Take 1 tablet (40 mg total) by mouth at bedtime. 04/12/23   Doree Albee, PA-C  fluticasone (FLONASE) 50 MCG/ACT nasal spray Place 1 spray into both nostrils daily. 11/07/22   Hetty Blend, FNP  GAMUNEX-C 20 GM/200ML SOLN  05/20/22   [provider]  loratadine (CLARITIN) 10 MG tablet Take 1 tablet (10 mg total) by mouth  daily. 03/08/23   Alfredo Martinez, MD  meloxicam (MOBIC) 15 MG tablet TAKE 1 TABLET(15 MG) BY MOUTH DAILY 02/22/23   Edwin Cap, DPM  metoCLOPramide (REGLAN) 10 MG tablet Take 1 tablet (10 mg total) by mouth every 8 (eight) hours as needed for nausea. 04/17/23   Gloris Manchester, MD  mometasone (ELOCON) 0.1 % cream Apply 1 Application topically 2 (two) times daily. 11/23/22   Alfonse Spruce, MD  montelukast (SINGULAIR) 10 MG tablet Take 1 tablet (10 mg total) by mouth at bedtime. 11/23/22   Alfonse Spruce, MD  ondansetron (ZOFRAN) 8 MG tablet Take 1 tablet (8 mg total) by mouth every 8 (eight) hours as needed for nausea or vomiting. 04/12/23   Doree Albee, PA-C  ondansetron (ZOFRAN-ODT) 4 MG disintegrating tablet Take 1 tablet (4 mg total) by mouth every 8 (eight) hours as needed for nausea. 03/30/23   Levin Erp, MD  potassium chloride SA (KLOR-CON M) 20 MEQ tablet Take 1 tablet (20 mEq total) by mouth 2 (two) times daily for 3 days. 04/17/23 04/20/23  Gloris Manchester, MD  RETIN-A MICRO 0.04 % gel Apply 1 application  topically daily. 06/28/21   [provider]  sirolimus (RAPAMUNE) 2 MG tablet Take by mouth. 05/13/21   [provider]  sucralfate (CARAFATE) 1 GM/10ML suspension Take 10 mLs (1 g total) by mouth 4 (four) times daily -  with meals and at bedtime. 04/12/23   Doree Albee, PA-C      Allergies    Other, Peanut-containing drug products, Shellfish allergy, Apple juice, Orange fruit [citrus], Peach flavoring agent (non-screening), Tomato, and Tree extract    Review of Systems   Review of Systems  Respiratory:  Positive for cough.   Gastrointestinal:  Positive for vomiting.    Physical Exam Updated Vital Signs BP (!) 129/96 (BP Location: Right Arm)   Pulse (!) 112   Temp 98.7 F (37.1 C) (Oral)   Resp 16   Ht 5' 9.5" (1.765 m)   Wt (!) 166 kg   LMP 03/15/2023 (Approximate)   SpO2 99%   BMI 53.27 kg/m  Physical Exam Vitals reviewed.  Constitutional:      General: She is not in acute distress. HENT:     Head: Normocephalic and atraumatic.  Eyes:     Extraocular Movements: Extraocular movements intact.     Conjunctiva/sclera: Conjunctivae normal.     Pupils: Pupils are equal, round, and reactive to light.  Cardiovascular:     Rate and Rhythm: Normal rate and regular rhythm.     Pulses: Normal pulses.     Heart sounds: Normal heart sounds.     Comments: 2+ bilateral radial/dorsalis pedis pulses with regular rate EMR states patient is  tachycardic however patient is not tachycardic when I evaluated her Pulmonary:     Effort: Pulmonary effort is normal. No respiratory distress.     Breath sounds: Normal breath sounds.  Abdominal:     Palpations: Abdomen is soft.     Tenderness: There is no abdominal tenderness. There is no guarding or rebound.  Musculoskeletal:        General: Normal range of motion.     Cervical back: Normal range of motion and neck supple.     Comments: 5 out of 5 bilateral grip/leg extension strength  Skin:    General: Skin is warm and dry.     Capillary Refill: Capillary refill takes less than 2 seconds.  Neurological:  General: No focal deficit present.     Mental Status: She is alert and oriented to person, place, and time.     Comments: Sensation intact in all 4 limbs  Psychiatric:        Mood and Affect: Mood normal.     ED Results / Procedures / Treatments   Labs (all labs ordered are listed, but only abnormal results are displayed) Labs Reviewed - No data to display  EKG None  Radiology DG Chest 2 View Result Date: 04/19/2023 CLINICAL DATA:  Cough and vomiting EXAM: CHEST - 2 VIEW COMPARISON:  08/14/2022 FINDINGS: The heart size and mediastinal contours are within normal limits. Both lungs are clear. The visualized skeletal structures are unremarkable. Right IJ double-lumen power catheter again noted, tip SVC RA junction. Azygos fissure again noted in the medial right upper lobe, normal variant. Trachea midline. IMPRESSION: No active cardiopulmonary disease. Electronically Signed   By: Judie Petit.  Shick M.D.   On: 04/19/2023 13:34   CT ABDOMEN PELVIS W CONTRAST Result Date: 04/17/2023 CLINICAL DATA:  Abdominal pain for 2 weeks, nausea and vomiting EXAM: CT ABDOMEN AND PELVIS WITH CONTRAST TECHNIQUE: Multidetector CT imaging of the abdomen and pelvis was performed using the standard protocol following bolus administration of intravenous contrast. RADIATION DOSE REDUCTION: This exam was  performed according to the departmental dose-optimization program which includes automated exposure control, adjustment of the mA and/or kV according to patient size and/or use of iterative reconstruction technique. CONTRAST:  OMNIPAQUE IOHEXOL 350 MG/ML SOLN COMPARISON:  None Available. FINDINGS: Lower chest: Patchy peripheral subpleural consolidation at the left lateral costophrenic angle may reflect focal airspace disease or atelectasis. No pleural effusion. Hepatobiliary: No focal liver abnormality is seen. No gallstones, gallbladder wall thickening, or biliary dilatation. Pancreas: Unremarkable. No pancreatic ductal dilatation or surrounding inflammatory changes. Spleen: Normal in size without focal abnormality. Adrenals/Urinary Tract: Adrenal glands are unremarkable. Kidneys are normal, without renal calculi, focal lesion, or hydronephrosis. Bladder is unremarkable. Stomach/Bowel: No bowel obstruction or ileus. Normal appendix right lower quadrant. No bowel wall thickening or inflammatory change. Vascular/Lymphatic: No significant vascular findings are present. No enlarged abdominal or pelvic lymph nodes. Reproductive: Uterus and bilateral adnexa are unremarkable. Other: No free fluid or free intraperitoneal gas. No abdominal wall hernia. Musculoskeletal: No acute or destructive bony abnormalities. Reconstructed images demonstrate no additional findings. IMPRESSION: 1. Patchy consolidation within the left lower lobe, which may reflect hypoventilatory change or focal pneumonia. 2. No acute intra-abdominal or intrapelvic process. Electronically Signed   By: Sharlet Salina M.D.   On: 04/17/2023 22:06    Procedures Procedures    Medications Ordered in ED Medications  albuterol (PROVENTIL) (2.5 MG/3ML) 0.083% nebulizer solution 2.5 mg (has no administration in time range)    ED Course/ Medical Decision Making/ A&P                                 Medical Decision Making Amount and/or  Complexity of Data Reviewed Radiology: ordered.  Risk Prescription drug management.   [name] 27 y.o. presented today for URI like symptoms. Working DDx that I considered at this time includes, but not limited to, viral illness, pharyngitis, mono, sinusitis, electrolyte abnormality, AOM, pneumonia.  R/o DDx:  viral illness, pharyngitis, mono, sinusitis, electrolyte abnormality, AOM: these diagnoses are not consistent with patient's history, presentation, physical exam, labs/imaging findings.  Review of prior external notes: 04/17/2023 ED provider  Unique Tests and  My Interpretation:  Chest x-ray: No acute findings  Social Determinants of Health: none  Discussion with Independent Historian:  Mother  Discussion of Management of Tests: None  Risk: Medium: prescription drug management  Risk Stratification Score: None  Plan: On exam patient was in no acute distress with stable vitals.  Patient's exam was ultimately unremarkable.  EMR states that patient tachycardic however she was not tachycardic when I evaluated her.  I spoke with the patient and her mother patient was endorsing pneumonialike symptoms including coughing up green sputum and feeling generally unwell.  Patient to eat drink without issue and is not concerned about her abdomen at this time as she has nausea meds at home and is more concerned about her cough.  CT did show 2 days ago that she had left lower lobe pneumonia but was ultimately discharged without antibiotics.  Patient states she does not feel she needs to have labs redrawn as she states that she would like to be treated for the pneumonia.  Patient states that she is not pregnant and does not feel she is give a urine pregnancy test for antibiotics.  Will ambulate patient make sure she does not become hypoxic but do anticipate discharge with antibiotics and primary care follow-up.  Patient already has enough of her inhaler at home does not need a refill.  Patient  signed out to [facility] [place].  Please review their note for the continuation of patient's care.  The plan at this point is follow up on ambulatory pulse ox and if not hypoxia can dc.  This chart was dictated using voice recognition software.  Despite best efforts to proofread,  errors can occur which can change the documentation meaning.         Final Clinical Impression(s) / ED Diagnoses Final diagnoses:  Pneumonia of left lower lobe due to infectious organism    Rx / DC Orders ED Discharge Orders          Ordered    amoxicillin-clavulanate (AUGMENTIN) 875-125 MG tablet  Every 12 hours        04/19/23 1434    azithromycin (ZITHROMAX) 250 MG tablet  Daily        04/19/23 1434              Remi Deter 04/19/23 1504    Rolan Bucco, MD 04/19/23 1544

## 2023-04-19 NOTE — ED Triage Notes (Signed)
Pt via POV c/o continued vomiting and cough with tightness in lungs. She states she has felt SOB and was advised by her physician to return to the ER for eval due to recent CT results.

## 2023-04-19 NOTE — ED Provider Notes (Signed)
    Accepted handoff at shift change from [facility]. Please see prior provider note for more detail.   Briefly: Patient is 28 y.o. "generalized abdominal pain and productive cough. Patient was seen 2 days ago and had a CT scan done that shows left lower lobe pneumonia and at that time states she was unaware of the productive cough being and pneumonia symptoms and was ultimately discharged without antibiotics. Patient came back that she states she still has productive cough and feels short of breath where she is using her albuterol inhaler a little more than normal. Patient denies any chest pain or vomiting states that she has Reglan at home which has been helping and that she is not concerned about her abdomen she is more concerned about the productive cough."  DDX: concern for viral illness, pharyngitis, mono, sinusitis, electrolyte abnormality, AOM, pneumonia.   Plan:  -dispo pending ambulation -Patient tolerated ambulation well with O2 sats 100%. Patient stating earlier that she felt mildly short of breath - I offered breathing treatment which patient declined stating that her SOB was not bad enough to warrant nebulizing treatment. Patient also stating that she has plenty of nebulizing treatments at home if she needs them. Patient requesting to go home. ABX already sent to pharmacy by previous provider.  I answered all questions that patient and mother asked.  - I asked nursing staff to recheck HR, but it appears that they were too busy to recheck. I personally checked patient's HR which was 100BPM. Educated patient to keep hydrated. Patient drank water that I provided for her. Also recommended following up with PCP in 1 week.  Patient verbalized understanding of plan. -Patient afebrile with stable vitals.  Provided with return precautions.  Discharged in good condition.   Dorthy Cooler, [place] 04/19/23 1646    Rolan Bucco, MD 04/21/23 [phone]

## 2023-04-20 ENCOUNTER — Ambulatory Visit (INDEPENDENT_AMBULATORY_CARE_PROVIDER_SITE_OTHER): Payer: Medicare HMO | Admitting: Student

## 2023-04-20 ENCOUNTER — Ambulatory Visit: Payer: Medicare HMO | Admitting: Family Medicine

## 2023-04-20 ENCOUNTER — Telehealth: Payer: Self-pay

## 2023-04-20 VITALS — BP 131/98 | HR 97 | Ht 69.0 in | Wt 365.0 lb

## 2023-04-20 DIAGNOSIS — Z09 Encounter for follow-up examination after completed treatment for conditions other than malignant neoplasm: Secondary | ICD-10-CM

## 2023-04-20 NOTE — Telephone Encounter (Signed)
Patient called requesting a refill on albuterol nebulizer. Patient currently has pneumonia.  [id number] - [place], [place] - [address]

## 2023-04-20 NOTE — Progress Notes (Signed)
  SUBJECTIVE:   CHIEF COMPLAINT / HPI:   ED f/u -Pt seen in ED for gastroitis vs gastroparesis and given reglan, ppi, carafate. CT abd /pelv showed possible PNA vs atelectasis. Pt returned to ED w/ respiratory concern and was give abx?  Comes in for blood work to check K, as she had hypokalemia and was given K supplement pills. Note's she's feeling a little better today, and started abx yesterday (amoxicillin and azithromycin). Report's taking 2 Tyl extra strength BID. Was trying to avoid Ibuprofen. Eating is improving, able to tolerate fluids better. Has not had emesis since returning from ER. Reports belly pain is better and just feels like soreness now.    PERTINENT  PMH / PSH:    OBJECTIVE:  BP (!) 131/98   Pulse 97   Ht 5\' 9"  (1.753 m)   Wt (!) 365 lb (165.6 kg)   LMP 03/15/2023 (Approximate)   SpO2 100%   BMI 53.90 kg/m  Physical Exam Constitutional:      General: She is not in acute distress.    Appearance: She is ill-appearing.  Cardiovascular:     Rate and Rhythm: Normal rate and regular rhythm.     Pulses: Normal pulses.     Heart sounds: Normal heart sounds. No murmur heard.    No friction rub. No gallop.  Pulmonary:     Effort: Pulmonary effort is normal. No respiratory distress.     Breath sounds: Normal breath sounds. No stridor. No wheezing, rhonchi or rales.  Abdominal:     General: There is no distension.     Palpations: Abdomen is soft. There is no mass.     Tenderness: There is no abdominal tenderness. There is no guarding.     Hernia: No hernia is present.  Psychiatric:        Mood and Affect: Mood normal.        Behavior: Behavior normal.      ASSESSMENT/PLAN:   Assessment & Plan Hospital discharge follow-up Patient comes in for ED visit follow-up.  Patient appreciates that she was diagnosed with pneumonia, and started taking antibiotics.  Patient appreciates she is tolerating antibiotics well, starting to feel a little bit better.  Patient also  appreciates that she is no longer having emesis, since leaving the ED, and is able to eat and tolerate foods better.  Patient was requesting pain medication, as she was taking Tylenol, and attempting avoid ibuprofen.  Reassured patient she is okay to use ibuprofen as needed.  Patient wanting her potassium retested as she was hypokalemic while in the emergency department, and sent home with potassium supplementation.  Will retest blood work, and recommend ibuprofen/Tylenol as needed. - BMP - Ibuprofen/Tylenol as needed (dosing restrictions given) No follow-ups on file. Bess Kinds, MD 04/20/2023, 11:59 AM PGY-3, [facility]

## 2023-04-20 NOTE — Assessment & Plan Note (Addendum)
Patient comes in for ED visit follow-up.  Patient appreciates that she was diagnosed with pneumonia, and started taking antibiotics.  Patient appreciates she is tolerating antibiotics well, starting to feel a little bit better.  Patient also appreciates that she is no longer having emesis, since leaving the ED, and is able to eat and tolerate foods better.  Patient was requesting pain medication, as she was taking Tylenol, and attempting avoid ibuprofen.  Reassured patient she is okay to use ibuprofen as needed.  Patient wanting her potassium retested as she was hypokalemic while in the emergency department, and sent home with potassium supplementation.  Will retest blood work, and recommend ibuprofen/Tylenol as needed. - BMP - Ibuprofen/Tylenol as needed (dosing restrictions given)

## 2023-04-20 NOTE — Patient Instructions (Signed)
It was great to see you! Thank you for allowing me to participate in your care!  I recommend that you always bring your medications to each appointment as this makes it easy to ensure we are on the correct medications and helps [place] not miss when refills are needed.  Our plans for today:  - Emergency Department Follow up  Today we are retesting blood work to see what your potassium looks like  - Pain management  You are okay to use ibuprofen as needed.  Start with 400 mg and see if pain resolves   If need, can try increasing to 600 mg or 800 mg.     No more than 800 mg twice in a day (every 12 hours)      or     No more than 400 mg four times in a day (every 6 hours)   You are also okay to use Tylenol as needed    No more than 1000 mg, three times in a day (every 8 hours)  We are checking some labs today, I will call you if they are abnormal will send you a MyChart message or a letter if they are normal.  If you do not hear about your labs in the next 2 weeks please let us know.  Take care and seek immediate care sooner if you develop any concerns.   Dr. Bess Kinds, MD [facility]

## 2023-04-20 NOTE — Telephone Encounter (Signed)
Forwarding message to provider for next step.  Nebulizer kit given on 11/22/22 - no medication sent in.

## 2023-04-21 MED ORDER — ALBUTEROL SULFATE (2.5 MG/3ML) 0.083% IN NEBU: 2.5000 mg | INHALATION_SOLUTION | Freq: Four times a day (QID) | RESPIRATORY_TRACT | 1 refills | Status: AC | PRN

## 2023-04-21 NOTE — Telephone Encounter (Signed)
I sent in neb solution.

## 2023-04-24 ENCOUNTER — Encounter: Payer: Self-pay | Admitting: Student

## 2023-04-24 ENCOUNTER — Telehealth: Payer: Self-pay

## 2023-04-24 MED ORDER — FLUCONAZOLE 150 MG PO TABS: 150.0000 mg | ORAL_TABLET | Freq: Once | ORAL | 0 refills | Status: AC

## 2023-04-24 NOTE — Telephone Encounter (Signed)
Patient calls nurse line requesting prescription for Diflucan.   She reports that she has just finished Augmentin. This was prescribed on 04/19/23 and is concerned that she has developed a yeast infection.   She reports vaginal itching and cottage cheese like discharge.  Denies fever, chills, abdominal or back pain.   Will forward to PCP.   Preferred pharmacy: [facility]  Veronda Prude, RN

## 2023-04-24 NOTE — Telephone Encounter (Signed)
Informed patient that the medicine was sent to the pharmacy and if she is not improving then to make a in person visit. Patient have an in person on 01/30 with Dr. Lonie Peak.

## 2023-04-25 ENCOUNTER — Ambulatory Visit (INDEPENDENT_AMBULATORY_CARE_PROVIDER_SITE_OTHER): Payer: Medicare HMO | Admitting: Licensed Clinical Social Worker

## 2023-04-25 DIAGNOSIS — F4320 Adjustment disorder, unspecified: Secondary | ICD-10-CM | POA: Diagnosis not present
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
## 2023-04-27 ENCOUNTER — Ambulatory Visit: Payer: Medicare HMO | Admitting: Family Medicine

## 2023-04-28 ENCOUNTER — Ambulatory Visit (INDEPENDENT_AMBULATORY_CARE_PROVIDER_SITE_OTHER): Payer: Medicare HMO | Admitting: *Deleted

## 2023-04-28 DIAGNOSIS — J309 Allergic rhinitis, unspecified: Secondary | ICD-10-CM | POA: Diagnosis not present

## 2023-05-01 LAB — BASIC METABOLIC PANEL
BUN/Creatinine Ratio: 8 — ABNORMAL LOW (ref 9–23)
BUN: 6 mg/dL (ref 6–20)
Calcium: 9.3 mg/dL (ref 8.7–10.2)
Chloride: 105 mmol/L (ref 96–106)
Creatinine, Ser: 0.71 mg/dL (ref 0.57–1.00)
Glucose: 73 mg/dL (ref 70–99)
Potassium: 4 mmol/L (ref 3.5–5.2)
Sodium: 139 mmol/L (ref 134–144)
eGFR: 121 mL/min/{1.73_m2} (ref >59)

## 2023-05-02 ENCOUNTER — Ambulatory Visit (INDEPENDENT_AMBULATORY_CARE_PROVIDER_SITE_OTHER): Payer: Medicare HMO | Admitting: Podiatry

## 2023-05-02 DIAGNOSIS — Q6651 Congenital pes planus, right foot: Secondary | ICD-10-CM

## 2023-05-02 DIAGNOSIS — M7751 Other enthesopathy of right foot: Secondary | ICD-10-CM | POA: Diagnosis not present

## 2023-05-02 DIAGNOSIS — Q6652 Congenital pes planus, left foot: Secondary | ICD-10-CM

## 2023-05-02 DIAGNOSIS — M7752 Other enthesopathy of left foot: Secondary | ICD-10-CM | POA: Diagnosis not present

## 2023-05-02 MED ORDER — TRIAMCINOLONE ACETONIDE 10 MG/ML IJ SUSP
2.5000 mg | Freq: Once | INTRAMUSCULAR | Status: AC
  Administered 2023-05-02: 2.5 mg via INTRA_ARTICULAR

## 2023-05-02 MED ORDER — DEXAMETHASONE SODIUM PHOSPHATE 120 MG/30ML IJ SOLN
4.0000 mg | Freq: Once | INTRAMUSCULAR | Status: AC
  Administered 2023-05-02: 4 mg via INTRA_ARTICULAR

## 2023-05-02 MED ORDER — METHYLPREDNISOLONE 4 MG PO TBPK: ORAL_TABLET | ORAL | 0 refills | Status: DC

## 2023-05-02 NOTE — Progress Notes (Signed)
  Subjective:  Patient ID: Tiffany Velasquez, female    DOB: [birth year],   MRN: [id number]  No chief complaint on file.   28 y.o. female presents for new concern of sharp pains in both of her feet . Relates this has been ongoing for a couple weeks. Relates severe pain in the first couple toes but mostly the great toe. Relates shooting pains. Denies any lower back pain or diabetes. Does have a family history of gout.   Denies any other pedal complaints. Denies n/v/f/c.   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 CFT <3 seconds. Normal hair growth on digits. No edema.  Skin. No lacerations or abrasions bilateral feet.  Musculoskeletal: MMT 5/5 bilateral lower extremities in DF, PF, Inversion and Eversion. Deceased ROM in DF of ankle joint. Bilateral first MPJ areas tender to touch and tender into the second digit as well edema noted in the first interspace bilateral. No pain elsewhere about the foot. Pes planus noted bilateral.  Neurological: Sensation intact to light touch.   Assessment:   1. Capsulitis of toe of left foot   2. Congenital pes planus of both feet   3. Capsulitis of toe of right foot      Plan:  Patient was evaluated and treated and all questions answered. -Xrays reviewed. Pes planus noted to bilateral feet with collapse of medial arch. No acute fractures or dislocations noted.  -Discussed treatement options; discussed pes planus deformity;conservative and  surgical  -Rx Orthotics from [facility] -Recommend good supportive shoes -Recommend daily stretching and icing -Discussed treatement options for potential capsulitis/gouty arthritis and gout education provided. -Patient opted for injection. After oral consent, injected first MPJ with 1cc lidocaine  and marcaine plain  mixed with 1 cc Dexmethasone phosphate and 0.5 cc of kenalog  without complication; post injection care explained. -Discussed diet and modifications.  -Ordered arthritic lab panel; will call patient with results if abnormal -Advised patient to call if symptoms are not improved within 1 week -Patient to return in 3 weeks for re-check/further discussion for long term management of gout or sooner if condition worsens.    Asberry Failing, DPM

## 2023-05-05 ENCOUNTER — Encounter: Payer: Self-pay | Admitting: Dietician

## 2023-05-05 ENCOUNTER — Encounter: Payer: Medicare HMO | Attending: Surgery | Admitting: Dietician

## 2023-05-05 DIAGNOSIS — Z713 Dietary counseling and surveillance: Secondary | ICD-10-CM | POA: Diagnosis not present

## 2023-05-05 DIAGNOSIS — E669 Obesity, unspecified: Secondary | ICD-10-CM | POA: Diagnosis not present

## 2023-05-05 DIAGNOSIS — Z6841 Body Mass Index (BMI) 40.0 and over, adult: Secondary | ICD-10-CM | POA: Insufficient documentation

## 2023-05-05 NOTE — Progress Notes (Signed)
 Supervised Weight Loss Visit Bariatric Nutrition Education  I connected with Tiffany Velasquez on 05/05/23 at  8:30 AM EST virtually by MyChart video and verified that I am speaking with the correct person using two identifiers. I discussed the limitations of this type of visit. The patient expressed understanding and agreed to proceed.  Planned surgery: RYGB Pt expectation of surgery: to be at a decent weight, where she feels better.  3 out of 6 SWL Appointments    NUTRITION ASSESSMENT   Anthropometrics  Start weight at [facility]: 398.6 lbs (date: 02/14/2023)  Height: 69.5 in Weight today: virtual visit (pt reports her last weight was 360 lbs) BMI: 54.15 kg/m2     Clinical   Pharmacotherapy: History of weight loss medication used: n/a  Medical hx: obesity, asthma, seasonal allergies, food allergies, GERD Medications: iron infusions, acetazolamide , adalimumab, albuterol , amlodipine , arnuity ellipta , atropine, azelastine , Azelex, bimatroprost, brimonidine , cetirizine , clinadamycin, dorzolamide -timolol , epinephrine , famotidine , fluticasone , gamunex-C, meloxicam , mometasone , montelukast , mycophenolate, prednisolone  acetate, retin-A  micro, sirolimus, sucralfate . Labs: A1c 5.7; triglycerides 182; HDL 34; LDL 110; Chol/HDL Ratio 5.2 Notable signs/symptoms: none noted Any previous deficiencies? No  Lifestyle & Dietary Hx  Pt states she is not feeling well. Pt states she went to the emergency department twice, stating they checked her gall bladder. Pt states she had been nauseated, and then was diagnosed with pneumonia, with shortness of breath. Pt states they had thought that it was gastritis. Pt states she was told to drink plenty of fluids, stating she is getting 64 ounces per day. Pt agreeable to new goal of increased physical activity.  Estimated daily fluid intake: 64 oz Supplements:  Current average weekly physical activity: ADLs; water aerobics and rehab once a week (planning for 8  weeks)  24-Hr Dietary Recall First Meal: skip or oatmeal with fruit or eggs or grits or strawberries and cottage cheese and boiled egg Snack:  Second Meal: tuna protein crackers Snack: deli meat, cottage cheese Third Meal: tuna and steamed carrots Snack: pop cycle (outshine zero sugar) Beverages: water with ice, plain water or water with crystal light, un-sweet hot tea  Estimated Energy Needs Calories: 1500  NUTRITION DIAGNOSIS  Overweight/obesity ([place]-3.3) related to past poor dietary habits and physical inactivity as evidenced by patient w/ planned RYGB surgery following dietary guidelines for continued weight loss.  NUTRITION INTERVENTION  Nutrition counseling (C-1) and education (E-2) to facilitate bariatric surgery goals.  Pre-Op Goals Reviewed with the Patient Encouraged patient to honor their body's internal hunger and fullness cues.  Throughout the day, check in mentally and rate hunger. Stop eating when satisfied not full regardless of how much food is left on the plate.  Get more if still hungry 20-30 minutes later.  The key is to honor satisfaction so throughout the meal, rate fullness factor and stop when comfortably satisfied not physically full. The key is to honor hunger and fullness without any feelings of guilt or shame.  Pay attention to what the internal cues are, rather than any external factors. This will enhance the confidence you have in listening to your own body and following those internal cues enabling you to increase how often you eat when you are hungry not out of appetite and stop when you are satisfied not full.  Encouraged pt to continue to eat balanced meals inclusive of non starchy vegetables 2 times a day 7 days a week Encouraged pt to continue to drink a minium 64 fluid ounces with half being plain water to satisfy proper hydration  Why you need complex carbohydrates: Whole grains and other complex carbohydrates are required to have a healthy diet. Whole  grains provide fiber which can help with blood glucose levels and help keep you satiated. Fruits and starchy vegetables provide essential vitamins and minerals required for immune function, eyesight support, brain support, bone density, wound healing and many other functions within the body. According to the current evidenced based 2020-2025 Dietary Guidelines for Americans, complex carbohydrates are part of a healthy eating pattern which is associated with a decreased risk for type 2 diabetes, cancers, and cardiovascular disease.   Pre-Op Goals Progress & New Goals Continue: to avoid skipping meals; eat every 3-5 hours. Continue: avoiding simple carbohydrates; find complex carbohydrate options like whole grains, whole fruits, unprocessed starchy vegetables. Continue: increase non-starchy vegetables, aim for 2 or more servings per day. Continue: Practice not drinking with your meals. Continue: small frequent scheduled meals and snacks. New: walk 15-30 minutes 3-4 times per week.  Handouts Provided Include    Learning Style & Readiness for Change Teaching method utilized: Visual & Auditory  Demonstrated degree of understanding via: Teach Back  Readiness Level: preparation Barriers to learning/adherence to lifestyle change: nothing identified  RD's Notes for Next Visit Patient progress toward chosen goals   MONITORING & EVALUATION Dietary intake, weekly physical activity, body weight, and pre-op goals in 1 month.   Next Steps  Patient is to return to [facility] in 1 month for next SWL visit.

## 2023-05-08 ENCOUNTER — Telehealth: Payer: Self-pay | Admitting: Physician Assistant

## 2023-05-08 NOTE — Telephone Encounter (Signed)
 Patient's mother called requesting to speak with a nurse stating she is having upper abdominal pain and cannot keep anything down. Patient's mother is seeking further advise. Patient is scheduled for 05/24/2023 at 11AM

## 2023-05-09 NOTE — Telephone Encounter (Signed)
Pt has an OV scheduled with her PCP tomorrow. She knows we do not have anything available sooner than the appt she is already scheduled for, she will keep it as scheduled.

## 2023-05-10 ENCOUNTER — Ambulatory Visit (INDEPENDENT_AMBULATORY_CARE_PROVIDER_SITE_OTHER): Payer: Medicare HMO | Admitting: Student

## 2023-05-10 VITALS — BP 146/97 | HR 94 | Ht 69.0 in | Wt 360.4 lb

## 2023-05-10 DIAGNOSIS — R10816 Epigastric abdominal tenderness: Secondary | ICD-10-CM | POA: Diagnosis not present

## 2023-05-10 DIAGNOSIS — R5383 Other fatigue: Secondary | ICD-10-CM

## 2023-05-10 MED ORDER — POLYETHYLENE GLYCOL 3350 17 GM/SCOOP PO POWD: 17.0000 g | Freq: Every day | ORAL | 10 refills | Status: DC

## 2023-05-10 NOTE — Assessment & Plan Note (Addendum)
Patient comes in with 2-week complaint of fatigue.  Patient appreciates she has been sleeping normally, not snoring, and getting at least 8 hours of sleep/feeling rested after sleep. Patient has also been eating regularly, and has been losing weight intentionally her healthy diet habits.  Patient denies any cold intolerance, and has not felt unwell. Patient has not been sexually active and has regular periods that she does not appreciate as heavy.  Unsure cause of patient's symptoms.  Will obtain lab work for anemia, and thyroid dysfunction.  Suspect there may be a component of viral illness that she is getting over, as potential cause. - CBC, TSH, iron, TIBC, ferritin

## 2023-05-10 NOTE — Patient Instructions (Signed)
It was great to see you! Thank you for allowing me to participate in your care!  I recommend that you always bring your medications to each appointment as this makes it easy to ensure we are on the correct medications and helps [place] not miss when refills are needed.  Our plans for today:  - Low energy We are checking some blood work to see if theres a cause to your fatigue. This may have come from a short viral illness you've had or something related.  -Blood work for anemia and thyroid issues  - Stomach Pain This sounds like your abdominal wall is tight/irritated. This may be due to activities you've been doing with your abdomen. Keep an eye on it, and let us know if it get's worse. But your abdominal muscles may be tense.  We are checking some labs today, I will call you if they are abnormal will send you a MyChart message or a letter if they are normal.  If you do not hear about your labs in the next 2 weeks please let us know.  Take care and seek immediate care sooner if you develop any concerns.   Dr. Bess Kinds, MD [facility]

## 2023-05-10 NOTE — Addendum Note (Signed)
Addended by: Bess Kinds T on: 05/10/2023 01:14 PM   Modules accepted: Orders

## 2023-05-10 NOTE — Assessment & Plan Note (Addendum)
Patient comes in with abdominal fullness/heaviness, located in epigastric area, has been going on since January.  Patient has had negative CT scan, negative EGD for her abdominal symptoms.  Patient is due to follow-up with GI end of March.  Patient's abdomen soft, but tender to palpation over epigastric area/upper abdominal muscles.  Unsure what could be because of abdominal tenderness, could be functional dyspepsia, given negative workup thus far.  Symptoms seem unrelated to eating, patient having normal bowel movements, urinary habits, and periods.  Low concern for acute abdomen, or infectious etiology.  Also considered stomach ulcer, but less concern as symptoms not related to meals.  Will recommend patient follow-up with GI. - Follow-up with GI

## 2023-05-10 NOTE — Progress Notes (Signed)
  SUBJECTIVE:   CHIEF COMPLAINT / HPI:   Low energy? Has had low energy for last 2 weeks. Eating regularly and drinking regularly. Sleeping at least 8 hours, feels rested after sleeping, and not snoring. No cold intolerance and has been losing weight.  Stomach pain? Having stomach heaviness that feels like her stomach is cramped/tight. Is limiting her from bending over/walking around. She says it feels like bricks sitting on inside of stomach. No pain, no nausea, vomiting, diarrhea or blood in stools. Having regular stools. Still losing weight by intention, drinking fluids regularly and eating healthy. Not related to eating. Having regular periods not heavy flow. Has been an issue since January. Heaviness located from middle of belly up. Feels tight in stomach like she had just done a bunch of situps.  PERTINENT  PMH / PSH:    OBJECTIVE:  LMP 03/15/2023 (Approximate)  Physical Exam Abdominal:     General: Bowel sounds are normal. There is no distension or abdominal bruit.     Palpations: Abdomen is soft. There is no shifting dullness, fluid wave, hepatomegaly, splenomegaly, mass or pulsatile mass.     Tenderness: There is abdominal tenderness in the epigastric area.      ASSESSMENT/PLAN:   Assessment & Plan Fatigue, unspecified type Patient comes in with 2-week complaint of fatigue.  Patient appreciates she has been sleeping normally, not snoring, and getting at least 8 hours of sleep/feeling rested after sleep. Patient has also been eating regularly, and has been losing weight intentionally her healthy diet habits.  Patient denies any cold intolerance, and has not felt unwell. Patient has not been sexually active and has regular periods that she does not appreciate as heavy.  Unsure cause of patient's symptoms.  Will obtain lab work for anemia, and thyroid dysfunction.  Suspect there may be a component of viral illness that she is getting over, as potential cause. - CBC, TSH, iron, TIBC,  ferritin Epigastric abdominal tenderness without rebound tenderness Patient comes in with abdominal fullness/heaviness, located in epigastric area, has been going on since January.  Patient has had negative CT scan, negative EGD for her abdominal symptoms.  Patient is due to follow-up with GI end of March.  Patient's abdomen soft, but tender to palpation over epigastric area/upper abdominal muscles.  Unsure what could be because of abdominal tenderness, could be functional dyspepsia, given negative workup thus far.  Symptoms seem unrelated to eating, patient having normal bowel movements, urinary habits, and periods.  Low concern for acute abdomen, or infectious etiology.  Also considered stomach ulcer, but less concern as symptoms not related to meals.  Will recommend patient follow-up with GI. - Follow-up with GI No follow-ups on file. Bess Kinds, MD 05/10/2023, 9:04 AM PGY-3, [facility]

## 2023-05-12 ENCOUNTER — Telehealth: Payer: Self-pay

## 2023-05-12 ENCOUNTER — Other Ambulatory Visit: Payer: Medicare HMO

## 2023-05-12 DIAGNOSIS — R5383 Other fatigue: Secondary | ICD-10-CM | POA: Diagnosis not present

## 2023-05-12 NOTE — Telephone Encounter (Signed)
Pts mother came to the office and asked to speak to manager or nurse in charge. Mother states when pt was seen by Quentin Mulling PA she was told she would be seen again in 3 weeks. Mother upset, states she was scheduled for an appt in March. Discussed with mother that the OV note states pt to follow-up in 3 mths around 07/11/23. She states pt is still having issues with abd heaviness/fullness and needs to be seen sooner. Pt scheduled to see Quentin Mulling PA 05/25/23 at 1:30pm. Pts mother aware of appt.

## 2023-05-13 LAB — IRON,TIBC AND FERRITIN PANEL
Ferritin: 305 ng/mL — ABNORMAL HIGH (ref 15–150)
Iron Saturation: 67 % — ABNORMAL HIGH (ref 15–55)
Iron: 147 ug/dL (ref 27–159)
Total Iron Binding Capacity: 221 ug/dL — ABNORMAL LOW (ref 250–450)
UIBC: 74 ug/dL — ABNORMAL LOW (ref 131–425)

## 2023-05-13 LAB — TSH RFX ON ABNORMAL TO FREE T4: TSH: 1.83 u[IU]/mL (ref 0.450–4.500)

## 2023-05-13 LAB — CBC
Hematocrit: 32.6 % — ABNORMAL LOW (ref 34.0–46.6)
Hemoglobin: 9.7 g/dL — ABNORMAL LOW (ref 11.1–15.9)
MCH: 24.8 pg — ABNORMAL LOW (ref 26.6–33.0)
MCHC: 29.8 g/dL — ABNORMAL LOW (ref 31.5–35.7)
MCV: 83 fL (ref 79–97)
Platelets: 284 10*3/uL (ref 150–450)
RBC: 3.91 x10E6/uL (ref 3.77–5.28)
RDW: 22.4 % — ABNORMAL HIGH (ref 11.7–15.4)
WBC: 6.1 10*3/uL (ref 3.4–10.8)

## 2023-05-19 ENCOUNTER — Telehealth: Payer: Self-pay | Admitting: Podiatry

## 2023-05-19 NOTE — Telephone Encounter (Signed)
 Pt called and had just spoken to someone in the office but did not get a name.  Pt stated she is having shooting/burning pain that started out of no where. She got the shot in for not long ago(2-3 days). She has no medication to help with the pain. Could you send in something.Pharmacy is correct in the chart.

## 2023-05-21 ENCOUNTER — Encounter (HOSPITAL_COMMUNITY): Payer: Self-pay | Admitting: Emergency Medicine

## 2023-05-21 ENCOUNTER — Emergency Department (HOSPITAL_COMMUNITY)
Admission: EM | Admit: 2023-05-21 | Discharge: 2023-05-21 | Disposition: A | Discharge status: Home / Self Care | Payer: Medicare HMO | Attending: Emergency Medicine | Admitting: Emergency Medicine

## 2023-05-21 ENCOUNTER — Other Ambulatory Visit: Payer: Self-pay

## 2023-05-21 DIAGNOSIS — M79672 Pain in left foot: Secondary | ICD-10-CM | POA: Insufficient documentation

## 2023-05-21 DIAGNOSIS — Z91010 Allergy to peanuts: Secondary | ICD-10-CM | POA: Insufficient documentation

## 2023-05-21 DIAGNOSIS — M79674 Pain in right toe(s): Secondary | ICD-10-CM

## 2023-05-21 DIAGNOSIS — M79671 Pain in right foot: Secondary | ICD-10-CM | POA: Insufficient documentation

## 2023-05-21 MED ORDER — HYDROCODONE-ACETAMINOPHEN 5-325 MG PO TABS: 1.0000 | ORAL_TABLET | Freq: Four times a day (QID) | ORAL | 0 refills | Status: DC | PRN

## 2023-05-21 MED ORDER — HYDROCODONE-ACETAMINOPHEN 5-325 MG PO TABS
1.0000 | ORAL_TABLET | Freq: Once | ORAL | Status: AC
  Administered 2023-05-21: 1 via ORAL
  Filled 2023-05-21: qty 1

## 2023-05-21 MED ORDER — IBUPROFEN 800 MG PO TABS
800.0000 mg | ORAL_TABLET | Freq: Once | ORAL | Status: DC
  Filled 2023-05-21: qty 1

## 2023-05-21 NOTE — ED Triage Notes (Signed)
 Pt is having tingling, burning, shooting pain at bottoms of feet radiating up through the top of foot and toes.

## 2023-05-21 NOTE — Discharge Instructions (Addendum)
 Evaluation today was overall reassuring.  Recommend you follow-up with your podiatrist.  Recommend ibuprofen, Tylenol and applying ice 3-4 times a day.  I sent 4 tablets of Norco to your pharmacy for acute pain.

## 2023-05-21 NOTE — ED Provider Notes (Addendum)
 [place] EMERGENCY DEPARTMENT AT [facility] Provider Note   CSN: [id number] Arrival date & time: 05/21/23  1238     History  Chief Complaint  Patient presents with   Foot Pain   HPI Tiffany Velasquez is a 28 y.o. female presenting for toe pain.  Started 2 days ago.  States the pain begins in the tips of her toes on both feet and radiates to the pads of her feet.  It is a sharp tingling burning pain.  It is worse with walking.  States she recently went to her podiatrist for some more pain in the right foot and received a steroid injection at that time.  She states that did help the pain at that time but pain is returned and now is in both feet.  Denies any pain up the lower leg.  Denies any trauma to her toes or feet.  Has not taken any medications at home to treat her pain.   Foot Pain       Home Medications Prior to Admission medications   Medication Sig Start Date End Date Taking? Authorizing Provider  HYDROcodone-acetaminophen (NORCO/VICODIN) 5-325 MG tablet Take 1 tablet by mouth every 6 (six) hours as needed. 05/21/23  Yes Gareth Eagle, PA-C  acetaZOLAMIDE (DIAMOX) 250 MG tablet Take 250 mg by mouth 2 (two) times daily. 01/17/23   [provider]  Adalimumab 40 MG/0.4ML PNKT Inject into the skin. 03/03/20   [provider]  albuterol (PROVENTIL) (2.5 MG/3ML) 0.083% nebulizer solution Take 3 mLs (2.5 mg total) by nebulization every 6 (six) hours as needed for wheezing or shortness of breath. 04/21/23   Alfonse Spruce, MD  albuterol (VENTOLIN HFA) 108 (90 Base) MCG/ACT inhaler Inhale 2 puffs every 4-6 hours as needed for cough, wheeze, tightness in chest, or shortness of breath 11/07/22   Ambs, Norvel Richards, FNP  amLODipine (NORVASC) 5 MG tablet Take 1 tablet (5 mg total) by mouth at bedtime. 12/20/22   McDiarmid, Leighton Roach, MD  ARNUITY ELLIPTA 100 MCG/ACT AEPB Inhale 2 puffs into the lungs in the morning and at bedtime. 01/13/23   [provider]  azelastine (ASTELIN) 0.1 % nasal spray USE 2 SPRAYS IN EACH NOSTRIL TWICE DAILY AS NEEDED FOR RUNNY NOSE/DRAINAGE DOWN THROAT 11/23/22   Alfonse Spruce, MD  AZELEX 20 % cream Apply topically daily. 06/28/21   [provider]  azithromycin (ZITHROMAX) 250 MG tablet Take 1 tablet (250 mg total) by mouth daily. Take first 2 tablets together, then 1 every day until finished. 04/19/23   Netta Corrigan, PA-C  bimatoprost (LUMIGAN) 0.01 % SOLN Apply to eye. 11/01/22   [provider]  brimonidine (ALPHAGAN) 0.15 % ophthalmic solution 1 drop 3 (three) times daily. 02/01/20   [provider]  budesonide-formoterol (SYMBICORT) 160-4.5 MCG/ACT inhaler Inhale 2 puffs into the lungs in the morning and at bedtime. 11/23/22   Alfonse Spruce, MD  clindamycin (CLEOCIN T) 1 % lotion Apply topically 2 (two) times daily. 08/17/15   [provider]  dexlansoprazole (DEXILANT) 60 MG capsule Take 1 capsule (60 mg total) by mouth daily. 04/12/23   Doree Albee, PA-C  dorzolamide-timolol (COSOPT) 2-0.5 % ophthalmic solution 1 drop 2 (two) times daily.    [provider]  famotidine (PEPCID) 40 MG tablet Take 1 tablet (40 mg total) by mouth at bedtime. 04/12/23   Doree Albee, PA-C  fluticasone (FLONASE) 50 MCG/ACT nasal spray Place 1 spray into  both nostrils daily. 11/07/22   Hetty Blend, FNP  GAMUNEX-C 20 GM/200ML SOLN  05/20/22   [provider]  loratadine (CLARITIN) 10 MG tablet Take 1 tablet (10 mg total) by mouth daily. 03/08/23   Alfredo Martinez, MD  meloxicam (MOBIC) 15 MG tablet TAKE 1 TABLET(15 MG) BY MOUTH DAILY 02/22/23   Edwin Cap, DPM  methylPREDNISolone (MEDROL DOSEPAK) 4 MG TBPK tablet Take as directed 05/02/23   Louann Sjogren, DPM  metoCLOPramide (REGLAN) 10 MG tablet Take 1 tablet (10 mg total) by mouth every 8 (eight) hours as needed for nausea. 04/17/23   Gloris Manchester, MD  mometasone (ELOCON) 0.1 % cream Apply 1 Application  topically 2 (two) times daily. 11/23/22   Alfonse Spruce, MD  montelukast (SINGULAIR) 10 MG tablet Take 1 tablet (10 mg total) by mouth at bedtime. 11/23/22   Alfonse Spruce, MD  ondansetron (ZOFRAN) 8 MG tablet Take 1 tablet (8 mg total) by mouth every 8 (eight) hours as needed for nausea or vomiting. 04/12/23   Doree Albee, PA-C  ondansetron (ZOFRAN-ODT) 4 MG disintegrating tablet Take 1 tablet (4 mg total) by mouth every 8 (eight) hours as needed for nausea. 03/30/23   Levin Erp, MD  polyethylene glycol powder (GLYCOLAX/MIRALAX) 17 GM/SCOOP powder Take 17 g by mouth daily. 05/10/23   Bess Kinds, MD  potassium chloride SA (KLOR-CON M) 20 MEQ tablet Take 1 tablet (20 mEq total) by mouth 2 (two) times daily for 3 days. 04/17/23 04/20/23  Gloris Manchester, MD  RETIN-A MICRO 0.04 % gel Apply 1 application  topically daily. 06/28/21   [provider]  sirolimus (RAPAMUNE) 2 MG tablet Take by mouth. 05/13/21   [provider]  sucralfate (CARAFATE) 1 GM/10ML suspension Take 10 mLs (1 g total) by mouth 4 (four) times daily -  with meals and at bedtime. 04/12/23   Doree Albee, PA-C      Allergies    Other, Peanut-containing drug products, Shellfish allergy, Apple juice, Orange fruit [citrus], Peach flavoring agent (non-screening), Tomato, and Tree extract    Review of Systems   See HPI  Physical Exam Updated Vital Signs BP (!) 151/118 (BP Location: Left Arm)   Pulse (!) 110   Temp 98.2 F (36.8 C) (Oral)   Resp 18   Ht 5\' 9"  (1.753 m)   Wt (!) 163.3 kg   LMP 05/15/2023 (Approximate)   SpO2 100%   BMI 53.16 kg/m  Physical Exam Constitutional:      Appearance: Normal appearance.  HENT:     Head: Normocephalic.     Nose: Nose normal.  Eyes:     Conjunctiva/sclera: Conjunctivae normal.  Pulmonary:     Effort: Pulmonary effort is normal.  Musculoskeletal:     Right foot: Tenderness present.     Left foot: Tenderness present.     Comments:  Tenderness with palpation generally about all the toes of both feet.  No notable swelling, erythema.  Cap refill is brisk.  Pedal pulses 2+ bilaterally.  Feet are warm but not hot to touch.  No evidence of trauma no obvious deformities.  Neurological:     Mental Status: She is alert.  Psychiatric:        Mood and Affect: Mood normal.     ED Results / Procedures / Treatments   Labs (all labs ordered are listed, but only abnormal results are displayed) Labs Reviewed - No data to display  EKG None  Radiology No results  found.  Procedures Procedures    Medications Ordered in ED Medications  ibuprofen (ADVIL) tablet 800 mg (has no administration in time range)  HYDROcodone-acetaminophen (NORCO/VICODIN) 5-325 MG per tablet 1 tablet (has no administration in time range)    ED Course/ Medical Decision Making/ A&P                                 Medical Decision Making Risk Prescription drug management.   28 year old well-appearing female presenting for toe pain.  Exam notable for General Tenderness about the toes but no evidence of infection or trauma.  Suspect this is chronic toe pain.  Advised her to follow-up with her podiatrist.  Also advised ibuprofen, Tylenol and supportive measures until she can make an appointment with her podiatrist.  Patient was adamant that Tylenol ibuprofen not helping.  Treated her with Norco here and sent 4 tablets of Norco to her pharmacy for acute pain. Discussed return precautions.  Discharge.    Final Clinical Impression(s) / ED Diagnoses Final diagnoses:  Pain in toes of both feet    Rx / DC Orders ED Discharge Orders          Ordered    HYDROcodone-acetaminophen (NORCO/VICODIN) 5-325 MG tablet  Every 6 hours PRN        05/21/23 1437               Gareth Eagle, PA-C 05/21/23 1437    Rolan Bucco, MD 05/21/23 [phone]

## 2023-05-22 ENCOUNTER — Ambulatory Visit (INDEPENDENT_AMBULATORY_CARE_PROVIDER_SITE_OTHER): Payer: Medicare HMO | Admitting: Podiatry

## 2023-05-22 ENCOUNTER — Ambulatory Visit (INDEPENDENT_AMBULATORY_CARE_PROVIDER_SITE_OTHER): Payer: Medicare HMO | Admitting: Student

## 2023-05-22 ENCOUNTER — Encounter: Payer: Self-pay | Admitting: Student

## 2023-05-22 ENCOUNTER — Encounter: Payer: Self-pay | Admitting: Podiatry

## 2023-05-22 VITALS — BP 140/80 | HR 113 | Ht 69.0 in

## 2023-05-22 DIAGNOSIS — I10 Essential (primary) hypertension: Secondary | ICD-10-CM | POA: Diagnosis not present

## 2023-05-22 DIAGNOSIS — M7752 Other enthesopathy of left foot: Secondary | ICD-10-CM

## 2023-05-22 DIAGNOSIS — M7751 Other enthesopathy of right foot: Secondary | ICD-10-CM | POA: Diagnosis not present

## 2023-05-22 DIAGNOSIS — M79672 Pain in left foot: Secondary | ICD-10-CM | POA: Diagnosis not present

## 2023-05-22 DIAGNOSIS — M79671 Pain in right foot: Secondary | ICD-10-CM | POA: Diagnosis not present

## 2023-05-22 MED ORDER — INDOMETHACIN ER 75 MG PO CPCR: 75.0000 mg | ORAL_CAPSULE | Freq: Every day | ORAL | 0 refills | Status: AC

## 2023-05-22 MED ORDER — OLMESARTAN-AMLODIPINE-HCTZ 20-5-12.5 MG PO TABS: 1.0000 | ORAL_TABLET | Freq: Every day | ORAL | 0 refills | Status: DC

## 2023-05-22 MED ORDER — METHYLPREDNISOLONE 4 MG PO TBPK: ORAL_TABLET | ORAL | 0 refills | Status: DC

## 2023-05-22 NOTE — Progress Notes (Signed)
    SUBJECTIVE:   CHIEF COMPLAINT / HPI:   Foot Pain Saw podiatrist today. XR showed pes planus bilateral feet with collapse of medial arch per note Thought to be possible gout flare. They will follow up with her on labs and possible injection in 3 weeks. They gave her medrol dose pack and indomethacin. ED provider had given her 4 Norco pills.  Hypertension: Patient is a 28 y.o. female who present today for follow up of hypertension.   Patient endorses elevated BP and issues getting infusions due to elevated BP  Home medications include: Amlodipine 5 mg Patient endorses taking these medications as prescribed.  Most recent creatinine trend:  Lab Results  Component Value Date   CREATININE 0.71 04/20/2023   CREATININE 0.86 04/17/2023   CREATININE 0.81 05/09/2019    PERTINENT  PMH / PSH: Moderate persistent asthma, allergic rhinitis, GERD, eczema, obesity, panuveitis of eyes  OBJECTIVE:   BP (!) 140/80   Pulse (!) 113   Ht 5\' 9"  (1.753 m)   LMP 05/15/2023 (Approximate)   SpO2 100%   BMI 53.16 kg/m   General: NAD, awake, alert, responsive to questions Head: Normocephalic atraumatic Respiratory: chest rises symmetrically,  no increased work of breathing Extremities: Moves upper and lower extremities, tenderness to palpation of right foot to, 2+ DP   ASSESSMENT/PLAN:   Assessment & Plan Hypertension, unspecified type Patient's blood pressure is not controlled today. BP: (!) 140/80. Goal of 130/80. Patient's medication regimen includes amlodipine 5 mg daily.  Reviewed prior blood pressures and they have all been quite elevated 170s to 180s systolics. -Changes to current regimen include start triple therapy with olmesartan-amlodipine-HCTZ 20-5-12.5 -Follow up in 2 weeks for BP follow up and BMP Foot pain, bilateral Right worse than left.  Tender to light palpation.  Imaging at podiatrist office was negative for fractures.  Good pulses. ?Gout -Continue steroids and  indomethacin that was started by podiatry today -Discussed will not prescribe any additional narcotics at this time   Levin Erp, MD [facility]

## 2023-05-22 NOTE — Patient Instructions (Signed)
 It was great to see you! Thank you for allowing me to participate in your care!   Our plans for today:  - We will increase your BP medication to a triple therapy of olmesartan-amlodipine-hydrochlorothiazide to help control your pressures - Please return in 2 weeks for BP follow up and repeat labs - recommend continuing the medications that your podiatrist started you on, this may take some time to improve  Take care and seek immediate care sooner if you develop any concerns.  Levin Erp, MD

## 2023-05-22 NOTE — Progress Notes (Signed)
  Subjective:  Patient ID: Tiffany Velasquez, female    DOB: 02/18/1996,   MRN: [id number]  No chief complaint on file.   28 y.o. female presents for return of sharp pains in both of her feet . Relates the injections helped for about a week. She relates three or four days ago the pain returned and causing a lot of pain and burning and tingling. She was seen in ED and and given some pain medication and told to follow back up.  Denies any lower back pain or diabetes. Does have a family history of gout.   Denies any other pedal complaints. Denies n/v/f/c.   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 CFT <3 seconds. Normal hair growth on digits. No edema.  Skin. No lacerations or abrasions bilateral feet.  Musculoskeletal: MMT 5/5 bilateral lower extremities in DF, PF, Inversion and Eversion. Deceased ROM in DF of ankle joint. Bilateral first MPJ areas tender to touch and tender into the second digit as well edema noted in the first interspace bilateral. No pain elsewhere about the foot. Pes planus noted bilateral.  Neurological: Sensation intact to light touch.   Assessment:   1. Capsulitis of toe of left foot   2. Capsulitis of toe of right foot       Plan:  Patient was evaluated and treated and all questions answered. -Xrays reviewed. Pes planus noted to bilateral feet with collapse of medial arch. No acute fractures or dislocations noted.  -Discussed treatement options; discussed pes planus deformity;conservative and  surgical  -Patient awaiting orthotics.  -Recommend good supportive shoes -Recommend daily stretching and icing -Discussed treatement options for potential capsulitis/gouty arthritis and gout education provided. -Discussed cannot try another injection for another three  weeks.  -Medrol dose pack provided  -Indomethacin provided.  -Discussed diet and modifications.  -Ordered arthritic lab panel; will call patient with results if abnormal. Will work on getting this. -Discussed to suspect most likely cause is still gout flare but could also be nerve related. Will await lab orders but go ahead and try another round of steroid and try indomethacin. If this does not improve would consider further work-up for nerve relate cause. Potential MRI and NCV.  -Advised patient to call if symptoms are not improved within 1 week -Patient to return in 3 weeks for re-check/further discussion for long term management of gout or sooner if condition worsens.    Louann Sjogren, DPM

## 2023-05-22 NOTE — Assessment & Plan Note (Addendum)
 Patient's blood pressure is not controlled today. BP: (!) 140/80. Goal of 130/80. Patient's medication regimen includes amlodipine 5 mg daily.  Reviewed prior blood pressures and they have all been quite elevated 170s to 180s systolics. -Changes to current regimen include start triple therapy with olmesartan-amlodipine-HCTZ 20-5-12.5 -Follow up in 2 weeks for BP follow up and BMP

## 2023-05-23 ENCOUNTER — Ambulatory Visit: Payer: Medicare HMO

## 2023-05-23 LAB — ARTHRITIS PANEL
Anti Nuclear Antibody (ANA): NEGATIVE
Rheumatoid fact SerPl-aCnc: <10 [IU]/mL (ref <14.0)
Sed Rate: 120 mm/h — ABNORMAL HIGH (ref 0–32)
Uric Acid: 6.9 mg/dL — ABNORMAL HIGH (ref 2.6–6.2)

## 2023-05-23 NOTE — Telephone Encounter (Signed)
 Notified pt that Dr Ralene Cork did send in 2 medications yesterday, pharmacy had not contacted pt.  She asked if her results are back yet and did not see them yet.

## 2023-05-24 ENCOUNTER — Ambulatory Visit: Payer: Medicare HMO | Admitting: Gastroenterology

## 2023-05-24 ENCOUNTER — Telehealth: Payer: Self-pay | Admitting: Podiatry

## 2023-05-24 NOTE — Telephone Encounter (Signed)
 Mother states test results are in and would like for you to call in some mediations ASAP for Pt is in a great deal of pain.

## 2023-05-25 ENCOUNTER — Other Ambulatory Visit: Payer: Self-pay | Admitting: Podiatry

## 2023-05-25 ENCOUNTER — Ambulatory Visit (INDEPENDENT_AMBULATORY_CARE_PROVIDER_SITE_OTHER): Payer: Medicare HMO | Admitting: Physician Assistant

## 2023-05-25 ENCOUNTER — Encounter: Payer: Self-pay | Admitting: Physician Assistant

## 2023-05-25 ENCOUNTER — Telehealth: Payer: Self-pay | Admitting: Student

## 2023-05-25 VITALS — BP 128/82 | HR 65

## 2023-05-25 DIAGNOSIS — R7989 Other specified abnormal findings of blood chemistry: Secondary | ICD-10-CM

## 2023-05-25 DIAGNOSIS — R112 Nausea with vomiting, unspecified: Secondary | ICD-10-CM

## 2023-05-25 DIAGNOSIS — G8929 Other chronic pain: Secondary | ICD-10-CM

## 2023-05-25 DIAGNOSIS — K219 Gastro-esophageal reflux disease without esophagitis: Secondary | ICD-10-CM

## 2023-05-25 DIAGNOSIS — R1013 Epigastric pain: Secondary | ICD-10-CM | POA: Diagnosis not present

## 2023-05-25 MED ORDER — METHYLPREDNISOLONE 4 MG PO TBPK: ORAL_TABLET | ORAL | 1 refills | Status: DC

## 2023-05-25 MED ORDER — DEXLANSOPRAZOLE 60 MG PO CPDR: 60.0000 mg | DELAYED_RELEASE_CAPSULE | Freq: Every day | ORAL | 3 refills | Status: DC

## 2023-05-25 MED ORDER — FAMOTIDINE 40 MG PO TABS: 40.0000 mg | ORAL_TABLET | Freq: Every day | ORAL | 3 refills | Status: DC

## 2023-05-25 MED ORDER — ONDANSETRON HCL 8 MG PO TABS: 8.0000 mg | ORAL_TABLET | Freq: Three times a day (TID) | ORAL | 1 refills | Status: DC | PRN

## 2023-05-25 MED ORDER — SUCRALFATE 1 GM/10ML PO SUSP: 1.0000 g | Freq: Three times a day (TID) | ORAL | 3 refills | Status: DC

## 2023-05-25 NOTE — Progress Notes (Signed)
 Called patient to discuss results. Uric acid was elevated and do believe these are gout flares. Relates the pain is improving with the indomethacin and medrol dose pack. Discussed it should continue to get better but do believe a long term gout medication would be beneficial and advised to contact PCP to discuss this and prevent future flares. Did provide additional steroid pack to be taken in event of future flare if occurs before she is seen.

## 2023-05-25 NOTE — Telephone Encounter (Signed)
 Patient called stating that she got the results back about her feet, and she has gout. Asks if her doctor could please send in medication for her flare up.

## 2023-05-25 NOTE — Patient Instructions (Signed)
 You have been scheduled for a gastric emptying scan at [facility] on 06/02/2023 at 7:30am. Please arrive at least 30 minutes prior to your appointment for registration. Please make certain not to have anything to eat or drink after midnight the night before your test. Hold all stomach medications (ex: Zofran, phenergan, Reglan) 24 hours prior to your test. If you need to reschedule your appointment, please contact radiology scheduling at [phone]. _____________________________________________________________________ A gastric-emptying study measures how long it takes for food to move through your stomach. There are several ways to measure stomach emptying. In the most common test, you eat food that contains a small amount of radioactive material. A scanner that detects the movement of the radioactive material is placed over your abdomen to monitor the rate at which food leaves your stomach. This test normally takes about 4 hours to complete. _____________________________________________________________________  Gastroparesis Please do small frequent meals like 4-6 meals a day.  Eat and drink liquids at separate times.  Avoid high fiber foods, cook your vegetables, avoid high fat food.  Suggest spreading protein throughout the day (greek yogurt, glucerna, soft meat, milk, eggs) Choose soft foods that you can mash with a fork When you are more symptomatic, change to pureed foods foods and liquids.  Consider reading "Living well with Gastroparesis" by Reuel Derby Gastroparesis is a condition in which food takes longer than normal to empty from the stomach. This condition is also known as delayed gastric emptying. It is usually a long-term (chronic) condition. There is no cure, but there are treatments and things that you can do at home to help relieve symptoms. Treating the underlying condition that causes gastroparesis can also help relieve symptoms What are the causes? In many  cases, the cause of this condition is not known. Possible causes include: A hormone (endocrine) disorder, such as hypothyroidism or diabetes. A nervous system disease, such as Parkinson's disease or multiple sclerosis. Cancer, infection, or surgery that affects the stomach or vagus nerve. The vagus nerve runs from your chest, through your neck, and to the lower part of your brain. A connective tissue disorder, such as scleroderma. Certain medicines. What increases the risk? You are more likely to develop this condition if: You have certain disorders or diseases. These may include: An endocrine disorder. An eating disorder. Amyloidosis. Scleroderma. Parkinson's disease. Multiple sclerosis. Cancer or infection of the stomach or the vagus nerve. You have had surgery on your stomach or vagus nerve. You take certain medicines. You are female. What are the signs or symptoms? Symptoms of this condition include: Feeling full after eating very little or a loss of appetite. Nausea, vomiting, or heartburn. Bloating of your abdomen. Inconsistent blood sugar (glucose) levels on blood tests. Unexplained weight loss. Acid from the stomach coming up into the esophagus (gastroesophageal reflux). Sudden tightening (spasm) of the stomach, which can be painful. Symptoms may come and go. Some people may not notice any symptoms. How is this diagnosed? This condition is diagnosed with tests, such as: Tests that check how long it takes food to move through the stomach and intestines. These tests include: Upper gastrointestinal (GI) series. For this test, you drink a liquid that shows up well on X-rays, and then X-rays are taken of your intestines. Gastric emptying scintigraphy. For this test, you eat food that contains a small amount of radioactive material, and then scans are taken. Wireless capsule GI monitoring system. For this test, you swallow a pill (capsule) that records information about how foods  and fluid move through your stomach. Gastric manometry. For this test, a tube is passed down your throat and into your stomach to measure electrical and muscular activity. Endoscopy. For this test, a long, thin tube with a camera and light on the end is passed down your throat and into your stomach to check for problems in your stomach lining. Ultrasound. This test uses sound waves to create images of the inside of your body. This can help rule out gallbladder disease or pancreatitis as a cause of your symptoms. How is this treated? There is no cure for this condition, but treatment and home care may relieve symptoms. Treatment may include: Treating the underlying cause. Managing your symptoms by making changes to your diet and exercise habits. Taking medicines to control nausea and vomiting and to stimulate stomach muscles. Getting food through a feeding tube in the hospital. This may be done in severe cases. Having surgery to insert a device called a gastric electrical stimulator into your body. This device helps improve stomach emptying and control nausea and vomiting. Follow these instructions at home: Take over-the-counter and prescription medicines only as told by your health care provider. Follow instructions from your health care provider about eating or drinking restrictions. Your health care provider may recommend that you: Eat smaller meals more often. Eat low-fat foods. Eat low-fiber forms of high-fiber foods. For example, eat cooked vegetables instead of raw vegetables. Have only liquid foods instead of solid foods. Liquid foods are easier to digest. Drink enough fluid to keep your urine pale yellow. Exercise as often as told by your health care provider. Keep all follow-up visits. This is important. Contact a health care provider if you: Notice that your symptoms do not improve with treatment. Have new symptoms. Get help right away if you: Have severe pain in your abdomen that  does not improve with treatment. Have nausea that is severe or does not go away. Vomit every time you drink fluids. Summary Gastroparesis is a long-term (chronic) condition in which food takes longer than normal to empty from the stomach. Symptoms include nausea, vomiting, heartburn, bloating of your abdomen, and loss of appetite. Eating smaller portions, low-fat foods, and low-fiber forms of high-fiber foods may help you manage your symptoms. Get help right away if you have severe pain in your abdomen. This information is not intended to replace advice given to you by your health care provider. Make sure you discuss any questions you have with your health care provider. Document Revised: 07/22/2019 Document Reviewed: 07/22/2019 Elsevier Patient Education  2021 [employer]   Here some information about pelvic floor dysfunction. This may be contributing to some of your symptoms. We will continue with our evaluation but I do want you to consider adding on fiber supplement with low-dose MiraLAX daily. We could also refer to pelvic floor physical therapy.   Pelvic Floor Dysfunction, Female Pelvic floor dysfunction (PFD) is a condition that results when the group of muscles and connective tissues that support the organs in the pelvis (pelvic floor muscles) do not work well. These muscles and their connections form a sling that supports the colon and bladder. In women, they also support the uterus. PFD causes pelvic floor muscles to be too weak, too tight, or both. In PFD, muscle movements are not coordinated. This may cause bowel or bladder problems. It may also cause pain. What are the causes? This condition may be caused by an injury to the pelvic area or by a weakening of pelvic  muscles. This often results from pregnancy and childbirth or other types of strain. In many cases, the exact cause is not known. What increases the risk? The following factors may make you more likely to develop  this condition: Having chronic bladder tissue inflammation (interstitial cystitis). Being an older person. Being overweight. History of radiation treatment for cancer in the pelvic region. Previous pelvic surgery, such as removal of the uterus (hysterectomy). What are the signs or symptoms? Symptoms of this condition vary and may include: Bladder symptoms, such as: Trouble starting urination and emptying the bladder. Frequent urinary tract infections. Leaking urine when coughing, laughing, or exercising (stress incontinence). Having to pass urine urgently or frequently. Pain when passing urine. Bowel symptoms, such as: Constipation. Urgent or frequent bowel movements. Incomplete bowel movements. Painful bowel movements. Leaking stool or gas. Unexplained genital or rectal pain. Genital or rectal muscle spasms. Low back pain. Other symptoms may include: A heavy, full, or aching feeling in the vagina. A bulge that protrudes into the vagina. Pain during or after sex. How is this diagnosed? This condition may be diagnosed based on: Your symptoms and medical history. A physical exam. During the exam, your health care provider may check your pelvic muscles for tightness, spasm, pain, or weakness. This may include a rectal exam and a pelvic exam. In some cases, you may have diagnostic tests, such as: Electrical muscle function tests. Urine flow testing. X-ray tests of bowel function. Ultrasound of the pelvic organs. How is this treated? Treatment for this condition depends on the symptoms. Treatment options include: Physical therapy. This may include Kegel exercises to help relax or strengthen the pelvic floor muscles. Biofeedback. This type of therapy provides feedback on how tight your pelvic floor muscles are so that you can learn to control them. Internal or external massage therapy. A treatment that involves electrical stimulation of the pelvic floor muscles to help control pain  (transcutaneous electrical nerve stimulation, or TENS). Sound wave therapy (ultrasound) to reduce muscle spasms. Medicines, such as: Muscle relaxants. Bladder control medicines. Surgery to reconstruct or support pelvic floor muscles may be an option if other treatments do not help. Follow these instructions at home: Activity Do your usual activities as told by your health care provider. Ask your health care provider if you should modify any activities. Do pelvic floor strengthening or relaxing exercises at home as told by your physical therapist. Lifestyle Maintain a healthy weight. Eat foods that are high in fiber, such as beans, whole grains, and fresh fruits and vegetables. Limit foods that are high in fat and processed sugars, such as fried or sweet foods. Manage stress with relaxation techniques such as yoga or meditation. General instructions If you have problems with leakage: Use absorbable pads or wear padded underwear. Wash frequently with mild soap. Keep your genital and anal area as clean and dry as possible. Ask your health care provider if you should try a barrier cream to prevent skin irritation. Take warm baths to relieve pelvic muscle tension or spasms. Take over-the-counter and prescription medicines only as told by your health care provider. Keep all follow-up visits. How is this prevented? The cause of PFD is not always known, but there are a few things you can do to reduce the risk of developing this condition, including: Staying at a healthy weight. Getting regular exercise. Managing stress. Contact a health care provider if: Your symptoms are not improving with home care. You have signs or symptoms of PFD that get worse at home.  You develop new signs or symptoms. You have signs of a urinary tract infection, such as: Fever. Chills. Increased urinary frequency. A burning feeling when urinating. You have not had a bowel movement in 3 days  (constipation). Summary Pelvic floor dysfunction results when the muscles and connective tissues in your pelvic floor do not work well. These muscles and their connections form a sling that supports your colon and bladder. In women, they also support the uterus. PFD may be caused by an injury to the pelvic area or by a weakening of pelvic muscles. PFD causes pelvic floor muscles to be too weak, too tight, or a combination of both. Symptoms may vary from person to person. In most cases, PFD can be treated with physical therapies and medicines. Surgery may be an option if other treatments do not help. This information is not intended to replace advice given to you by your health care provider. Make sure you discuss any questions you have with your health care provider. Document Revised: 07/22/2020 Document Reviewed: 07/22/2020 Elsevier Patient Education  2022 [employer].

## 2023-05-25 NOTE — Progress Notes (Signed)
 Agree with assessment and plan as outlined.  Agree with workup for her nausea and vomiting. If she has a very focal area that is tender and abdominal wall pain / nerve entrapment then we can consider trigger point injection. However, sounds like her abdomen is diffusely tender from vomiting, I don't think that would be amenable to a trigger point injection, treatment is to control her vomiting, but happy to see her in follow up to reassess for this.

## 2023-05-25 NOTE — Progress Notes (Signed)
 05/25/2023 Tiffany Velasquez [id number] 04-05-1995  Referring provider: Levin Erp, MD Primary GI doctor: Dr. Adela Lank  ASSESSMENT AND PLAN:  Abdominal Pain   Pain localized to the epigastric region, exacerbated by engaging abdominal muscles. Likely muscular or nerve-related, possibly due to an entrapped anterior nerve.   -Refer to Dr. Adela Lank for AB wall injections to manage pain.    Nausea and Vomiting   Occurring twice weekly, improved from initial presentation.  -Continue Dexilant, famotidine, and Carafate as needed.   -Schedule a gastric emptying study to assess for possible gastroparesis.   - consider HIDA but not worse with fatty foods, no RUQ pain  Medication Refill   She requires a refill of current medications.   -Refill current medications as requested.       Patient Care Team: Bess Kinds, MD as PCP - General (Family Medicine) Elwin Mocha, MD as Consulting Physician (Ophthalmology) Dellis Anes Hetty Ely, MD as Consulting Physician (Allergy and Immunology) Bufford Buttner, MD as Referring Physician (Dermatology)  HISTORY OF PRESENT ILLNESS: 28 y.o. female with a past medical history of hypertension, asthma, morbid obesity, eczema, glaucoma and others listed below presents for evaluation of ER follow-up.   Patient is interested in bariatric surgery. 04/04/23 EGD with Quentin Ore, MD that showed nodular gastritis. 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 pylori negative metaplasia and dysplasia  03/20/2023 labs reviewed show no anemia Hgb 12.3 but she did have a microcytosis of 74 normal platelets, iron 70, iron saturation 25, folate 4.8, potassium 3.3 normal kidney function AST 58, ALT 31, alk phos 51. 04/17/2023 CT unremarkable.  Patient was seen 04/12/2023 for gastritis with nausea and vomiting epigastric discomfort and burping had EGD/showed negative H. pylori gastritis started on Dexilant, Carafate and Zofran also told to try  Salonpas patches for discomfort thought likely musculoskeletal from vomiting.  Discussed the use of AI scribe software for clinical note transcription with the patient, who gave verbal consent to proceed.  History of Present Illness   Tiffany Velasquez is a 28 year old female who presents with nausea, vomiting, and abdominal pain.  She experiences persistent nausea and vomiting approximately twice a week, which is an improvement from her previous state. The episodes are not associated with food intake and often occur in the morning. Vomiting starts with spitting and progresses to a single episode, which is less severe than before. No vomiting of food is noted, and the episodes are brief.  Abdominal pain occurs particularly when engaging abdominal muscles, such as when lifting her head. The pain is described as muscular and is not relieved by lidocaine patches. A heating pad provides intermittent relief. She also reports a sensation of heaviness in her abdomen when walking for extended periods, despite ongoing weight loss and satisfactory eating habits.  A CT scan performed in January showed no abnormalities in the pancreas, liver, gallbladder, spleen, or bowel. An endoscopy revealed inflammation, which is being treated with her current medication regimen.  Her current medications include famotidine, Dexilant, Zofran, Carafate, and Miralax, which she takes twice daily to maintain regular bowel movements. She recalls using metoclopramide in the past, which she believes helped with nausea and bowel movement.  No association of nausea and vomiting with food intake, particularly fatty foods. Occasional back pain is noted but not consistently associated with food intake or nausea. No significant bloating or discomfort after eating, except when she feels she has overeaten.     Wt Readings from Last 10 Encounters:  05/21/23 Marland Kitchen)  360 lb (163.3 kg)  05/10/23 (!) 360 lb 6.4 oz (163.5 kg)  04/20/23 (!) 365 lb  (165.6 kg)  04/19/23 (!) 366 lb (166 kg)  04/12/23 (!) 366 lb (166 kg)  04/12/23 (!) 366 lb (166 kg)  04/07/23 (!) 372 lb (168.7 kg)  04/04/23 (!) 398 lb (180.5 kg)  03/08/23 (!) 399 lb 8 oz (181.2 kg)  03/07/23 (!) 400 lb (181.4 kg)     She  reports that she has never smoked. She has never used smokeless tobacco. She reports current alcohol use. She reports that she does not use drugs.  RELEVANT GI HISTORY, LABS, IMAGING: 04/17/2023 CT AB and pelvis with contrast IMPRESSION: 1. Patchy consolidation within the left lower lobe, which may reflect hypoventilatory change or focal pneumonia. 2. No acute intra-abdominal or intrapelvic process. No focal liver abnormality seen, no gallstones or biliary dilation unremarkable pancreas and spleen no bowel abnormalities.   EGD 04/04/2023    - Normal esophagus.   - Gastroesophageal flap valve classified as Hill     Grade I (prominent fold, tight to endoscope).    - Nodular gastritis. Biopsied.    - Normal.  CBC    Component Value Date/Time   WBC 6.1 05/12/2023 1033   WBC 7.0 04/17/2023 2015   RBC 3.91 05/12/2023 1033   RBC 4.72 04/17/2023 2015   HGB 9.7 (L) 05/12/2023 1033   HCT 32.6 (L) 05/12/2023 1033   PLT 284 05/12/2023 1033   MCV 83 05/12/2023 1033   MCH 24.8 (L) 05/12/2023 1033   MCH 24.2 (L) 04/17/2023 2015   MCHC 29.8 (L) 05/12/2023 1033   MCHC 31.1 04/17/2023 2015   RDW 22.4 (H) 05/12/2023 1033   LYMPHSABS 0.9 05/09/2019 1150   LYMPHSABS 1.4 10/05/2017 1115   MONOABS 0.6 05/09/2019 1150   EOSABS 0.0 05/09/2019 1150   EOSABS 0.2 10/05/2017 1115   BASOSABS 0.0 05/09/2019 1150   BASOSABS 0.0 10/05/2017 1115   Recent Labs    04/17/23 2015 05/12/23 1033  HGB 11.4* 9.7*    CMP     Component Value Date/Time   NA 139 04/20/2023 1342   K 4.0 04/20/2023 1342   CL 105 04/20/2023 1342   CO2 CANCELED 04/20/2023 1342   GLUCOSE 73 04/20/2023 1342   GLUCOSE 83 04/17/2023 2015   BUN 6 04/20/2023 1342   CREATININE 0.71  04/20/2023 1342   CALCIUM 9.3 04/20/2023 1342   PROT 9.3 (H) 04/17/2023 2015   ALBUMIN 3.5 04/17/2023 2015   AST 69 (H) 04/17/2023 2015   ALT 29 04/17/2023 2015   ALKPHOS 44 04/17/2023 2015   BILITOT 0.9 04/17/2023 2015   GFRNONAA >60 04/17/2023 2015   GFRAA >60 05/09/2019 1150      Latest Ref Rng & Units 04/17/2023    8:15 PM 05/09/2019   11:50 AM  Hepatic Function  Total Protein 6.5 - 8.1 g/dL 9.3  7.5   Albumin 3.5 - 5.0 g/dL 3.5  3.7   AST 15 - 41 U/L 69  18   ALT 0 - 44 U/L 29  10   Alk Phosphatase 38 - 126 U/L 44  50   Total Bilirubin 0.0 - 1.2 mg/dL 0.9  0.9       Current Medications:   Current Outpatient Medications (Endocrine & Metabolic):    methylPREDNISolone (MEDROL DOSEPAK) 4 MG TBPK tablet, Take as directed  Current Outpatient Medications (Cardiovascular):    acetaZOLAMIDE (DIAMOX) 250 MG tablet, Take 250 mg by mouth 2 (two) times  daily.   Olmesartan-amLODIPine-HCTZ 20-5-12.5 MG TABS, Take 1 tablet by mouth daily.  Current Outpatient Medications (Respiratory):    albuterol (PROVENTIL) (2.5 MG/3ML) 0.083% nebulizer solution, Take 3 mLs (2.5 mg total) by nebulization every 6 (six) hours as needed for wheezing or shortness of breath.   albuterol (VENTOLIN HFA) 108 (90 Base) MCG/ACT inhaler, Inhale 2 puffs every 4-6 hours as needed for cough, wheeze, tightness in chest, or shortness of breath   ARNUITY ELLIPTA 100 MCG/ACT AEPB, Inhale 2 puffs into the lungs in the morning and at bedtime.   azelastine (ASTELIN) 0.1 % nasal spray, USE 2 SPRAYS IN EACH NOSTRIL TWICE DAILY AS NEEDED FOR RUNNY NOSE/DRAINAGE DOWN THROAT   budesonide-formoterol (SYMBICORT) 160-4.5 MCG/ACT inhaler, Inhale 2 puffs into the lungs in the morning and at bedtime.   fluticasone (FLONASE) 50 MCG/ACT nasal spray, Place 1 spray into both nostrils daily.   loratadine (CLARITIN) 10 MG tablet, Take 1 tablet (10 mg total) by mouth daily.   montelukast (SINGULAIR) 10 MG tablet, Take 1 tablet (10 mg total)  by mouth at bedtime.  Current Outpatient Medications (Analgesics):    Adalimumab 40 MG/0.4ML PNKT, Inject into the skin.   HYDROcodone-acetaminophen (NORCO/VICODIN) 5-325 MG tablet, Take 1 tablet by mouth every 6 (six) hours as needed.   indomethacin (INDOCIN SR) 75 MG CR capsule, Take 1 capsule (75 mg total) by mouth daily with breakfast.   Current Outpatient Medications (Other):    AZELEX 20 % cream, Apply topically daily.   azithromycin (ZITHROMAX) 250 MG tablet, Take 1 tablet (250 mg total) by mouth daily. Take first 2 tablets together, then 1 every day until finished.   bimatoprost (LUMIGAN) 0.01 % SOLN, Apply to eye.   brimonidine (ALPHAGAN) 0.15 % ophthalmic solution, 1 drop 3 (three) times daily.   clindamycin (CLEOCIN T) 1 % lotion, Apply topically 2 (two) times daily.   dorzolamide-timolol (COSOPT) 2-0.5 % ophthalmic solution, 1 drop 2 (two) times daily.   GAMUNEX-C 20 GM/200ML SOLN,    metoCLOPramide (REGLAN) 10 MG tablet, Take 1 tablet (10 mg total) by mouth every 8 (eight) hours as needed for nausea.   mometasone (ELOCON) 0.1 % cream, Apply 1 Application topically 2 (two) times daily.   polyethylene glycol powder (GLYCOLAX/MIRALAX) 17 GM/SCOOP powder, Take 17 g by mouth daily.   RETIN-A MICRO 0.04 % gel, Apply 1 application  topically daily.   sirolimus (RAPAMUNE) 2 MG tablet, Take by mouth.   dexlansoprazole (DEXILANT) 60 MG capsule, Take 1 capsule (60 mg total) by mouth daily.   famotidine (PEPCID) 40 MG tablet, Take 1 tablet (40 mg total) by mouth at bedtime.   ondansetron (ZOFRAN) 8 MG tablet, Take 1 tablet (8 mg total) by mouth every 8 (eight) hours as needed for nausea or vomiting.   potassium chloride SA (KLOR-CON M) 20 MEQ tablet, Take 1 tablet (20 mEq total) by mouth 2 (two) times daily for 3 days.   sucralfate (CARAFATE) 1 GM/10ML suspension, Take 10 mLs (1 g total) by mouth 4 (four) times daily -  with meals and at bedtime.  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   Allergies:  Allergies  Allergen Reactions   Other Itching, Rash and Swelling    Seafood,tomato paste, peanut butter, peaches, oranges, apples Throat swelling  Dust mite,  oak trees, grass- causes rash, itching   Peanut-Containing Drug Products Swelling    Throat swelling   Shellfish Allergy Swelling    Throat swelling    Apple Juice Rash   Orange Fruit [Citrus] Rash   Peach Flavoring Agent (Non-Screening) Rash   Tomato Rash, Hives and Itching   Tree Extract Rash     Surgical History:  She  has a past surgical history that includes no past surgery; Cataract extraction; PORTA CATH INSERTION; Esophagogastroduodenoscopy (N/A, 04/04/2023); and biopsy (04/04/2023). Family History:  Her family history includes Allergic rhinitis in her father; Asthma in her mother; Sudden death in her cousin.  REVIEW OF SYSTEMS  : All other systems reviewed and negative except where noted in the History of Present Illness.  PHYSICAL EXAM: BP 128/82   Pulse 65   LMP 05/15/2023 (Approximate)  General Appearance: obese, in no apparent distress. Head:   Normocephalic and atraumatic. Eyes:  sclerae anicteric,conjunctive pink  Respiratory: Respiratory effort normal, BS equal bilaterally without rales, rhonchi, wheezing. Cardio: RRR with no MRGs. Peripheral pulses intact.  Abdomen: Soft,  Obese ,active bowel sounds. mild tenderness in the entire abdomen with positive [place], TTP bilateral epigastric. Without guarding and Without rebound. No masses. Rectal: Not evaluated Musculoskeletal: Full ROM, Normal gait. Without edema. Skin:  Dry and intact without significant lesions or rashes Neuro: Alert and  oriented x4;  No focal deficits. Psych:  Cooperative. Normal mood and affect.    Doree Albee, PA-C 2:38 PM

## 2023-05-26 ENCOUNTER — Encounter: Payer: Self-pay | Admitting: Student

## 2023-05-29 ENCOUNTER — Ambulatory Visit (INDEPENDENT_AMBULATORY_CARE_PROVIDER_SITE_OTHER): Admitting: Family Medicine

## 2023-05-29 ENCOUNTER — Ambulatory Visit: Payer: Medicare HMO | Admitting: Podiatry

## 2023-05-29 VITALS — BP 123/80 | HR 116 | Temp 98.2°F | Wt 344.2 lb

## 2023-05-29 DIAGNOSIS — M109 Gout, unspecified: Secondary | ICD-10-CM | POA: Insufficient documentation

## 2023-05-29 MED ORDER — COLCHICINE 0.6 MG PO TABS: 0.6000 mg | ORAL_TABLET | Freq: Every day | ORAL | 2 refills | Status: DC

## 2023-05-29 MED ORDER — ALLOPURINOL 100 MG PO TABS: 100.0000 mg | ORAL_TABLET | Freq: Every day | ORAL | 2 refills | Status: DC

## 2023-05-29 NOTE — Progress Notes (Signed)
    SUBJECTIVE:   CHIEF COMPLAINT / HPI:   KT is a 28yo F w/ hx of GERD, gout, HTN, obesity that p/f f/u of gout. - Had a flare 1 month ago (this was her first flare) - Pain originally started in big toe of R foot, then spread to rest of toes. Pain was shooting. - went to [facility], was treated with steroid injection for potential arthritis and gout - Was improving for 2 weeks, but then she got another flare that involved both feet's toes.  Micah Flesher to ED 2/23 for uncontrolled pain and was given Norco - Pain is now improving but they want to start allopurinol to help prevent future flares. [facility] recommended she speak to PCP about starting this.  - Does not drink alcohol. Is allergic to selffish so she doesn't eat this. Doesn't eat a lot of red meat.  - Has hx of gout in maternal aunt and dad - She is getting gastric sleeve surgery in June, so she has been told to limit/avoid NSAID and prednisone.   OBJECTIVE:   BP 123/80   Pulse (!) 116   Temp 98.2 F (36.8 C)   Wt (!) 344 lb 3.2 oz (156.1 kg)   LMP 05/15/2023 (Approximate)   SpO2 100%   BMI 50.83 kg/m   General: Alert, pleasant woman. NAD. HEENT: NCAT. MMM. CV: RRR, no murmurs.  Resp: CTAB, no wheezing or crackles. Normal WOB on RA.  Abm: Soft, nontender, nondistended. BS present. Ext: Tender to palpation of BL big toes, without any swelling or erythema of the joints. Skin: Warm, well perfused   ASSESSMENT/PLAN:   Assessment & Plan Gout involving toe, unspecified cause, unspecified chronicity, unspecified laterality Uric acid elevated to 6.9 2/24. Appropriate to start allopurinol for prevention, however will need to be cautious with steroids and NSAIDs given hx of GERD and plan for gastric sleeve surgery in June. Plan to start allopurinol and a 3 month course of colchicine to prevent acute flares. - Allopurinol 100mg  daily - Colchicine 0.6mg  daily x3 months - f/u in 3 months to reassess uric acid  level - Counseled on lifestyle changes    Lincoln Brigham, MD [facility]

## 2023-05-29 NOTE — Patient Instructions (Signed)
 Good to see you today - Thank you for coming in  Things we discussed today:  1) We will start you on a medication called allopurinol today. This is a medication that lowers your gout crystal levels. You will also need to take colchicine for 3 months to help prevent flares while your body is getting used to the allopurinol. - Start taking allopurinol 100mg  once a day - Start taking colchicine 0.6mg  once a day for 3 months - Come back in 3 months to recheck your uric acid levels   2) I am including a packet with more information about gout and foods to avoid that can trigger gout attacks.

## 2023-05-29 NOTE — Assessment & Plan Note (Signed)
 Uric acid elevated to 6.9 2/24. Appropriate to start allopurinol for prevention, however will need to be cautious with steroids and NSAIDs given hx of GERD and plan for gastric sleeve surgery in June. Plan to start allopurinol and a 3 month course of colchicine to prevent acute flares. - Allopurinol 100mg  daily - Colchicine 0.6mg  daily x3 months - f/u in 3 months to reassess uric acid level - Counseled on lifestyle changes

## 2023-05-30 ENCOUNTER — Telehealth: Payer: Self-pay | Admitting: Student

## 2023-05-30 ENCOUNTER — Telehealth: Payer: Self-pay

## 2023-05-30 DIAGNOSIS — Z79899 Other long term (current) drug therapy: Secondary | ICD-10-CM | POA: Diagnosis not present

## 2023-05-30 DIAGNOSIS — Z961 Presence of intraocular lens: Secondary | ICD-10-CM | POA: Diagnosis not present

## 2023-05-30 DIAGNOSIS — H4043X4 Glaucoma secondary to eye inflammation, bilateral, indeterminate stage: Secondary | ICD-10-CM | POA: Diagnosis not present

## 2023-05-30 DIAGNOSIS — H4043X1 Glaucoma secondary to eye inflammation, bilateral, mild stage: Secondary | ICD-10-CM | POA: Diagnosis not present

## 2023-05-30 DIAGNOSIS — H209 Unspecified iridocyclitis: Secondary | ICD-10-CM | POA: Diagnosis not present

## 2023-05-30 DIAGNOSIS — H40043 Steroid responder, bilateral: Secondary | ICD-10-CM | POA: Diagnosis not present

## 2023-05-30 DIAGNOSIS — H44113 Panuveitis, bilateral: Secondary | ICD-10-CM | POA: Diagnosis not present

## 2023-05-30 DIAGNOSIS — H30033 Focal chorioretinal inflammation, peripheral, bilateral: Secondary | ICD-10-CM | POA: Diagnosis not present

## 2023-05-30 DIAGNOSIS — H3581 Retinal edema: Secondary | ICD-10-CM | POA: Diagnosis not present

## 2023-05-30 NOTE — Telephone Encounter (Signed)
 Patient calls nurse line stating that she is returning a missed call to Dr. Barbaraann Faster.   Reports that she received voicemail asking her to call back to the office to speak with him. Requesting returned call at [phone].  Forwarding to Dr. Barbaraann Faster.   Veronda Prude, RN

## 2023-05-30 NOTE — Telephone Encounter (Signed)
 Called patient at 3:09 05/30/23 to discuss gout dx  Sent patient mychart message  Patient returned call ~3:20 pm  Went to document phone call, in wrong encounter. Documented in new encounter.   Called patient and discussed gout dx ~3:31 pm

## 2023-05-30 NOTE — Telephone Encounter (Signed)
 Called to follow up previous call with patient, and respond to question about her labwork. Blood work shows inflammation from gout, and no signs of iron deficiency as discussed previously.

## 2023-05-30 NOTE — Telephone Encounter (Signed)
 Pt contacted and has medication

## 2023-05-31 ENCOUNTER — Other Ambulatory Visit: Payer: Self-pay | Admitting: Student

## 2023-05-31 DIAGNOSIS — H30033 Focal chorioretinal inflammation, peripheral, bilateral: Secondary | ICD-10-CM | POA: Diagnosis not present

## 2023-05-31 DIAGNOSIS — H44113 Panuveitis, bilateral: Secondary | ICD-10-CM | POA: Diagnosis not present

## 2023-05-31 DIAGNOSIS — Z79899 Other long term (current) drug therapy: Secondary | ICD-10-CM | POA: Diagnosis not present

## 2023-05-31 DIAGNOSIS — R5383 Other fatigue: Secondary | ICD-10-CM

## 2023-06-01 ENCOUNTER — Encounter: Payer: Self-pay | Admitting: Dietician

## 2023-06-01 ENCOUNTER — Encounter: Payer: Medicare HMO | Attending: Surgery | Admitting: Dietician

## 2023-06-01 DIAGNOSIS — Z713 Dietary counseling and surveillance: Secondary | ICD-10-CM | POA: Insufficient documentation

## 2023-06-01 DIAGNOSIS — Z6841 Body Mass Index (BMI) 40.0 and over, adult: Secondary | ICD-10-CM | POA: Diagnosis not present

## 2023-06-01 DIAGNOSIS — E669 Obesity, unspecified: Secondary | ICD-10-CM | POA: Insufficient documentation

## 2023-06-01 NOTE — Progress Notes (Signed)
 Supervised Weight Loss Visit Bariatric Nutrition Education  I connected with Tiffany Velasquez on 06/01/23 at  2:00 PM ESTvirtually by MyChart video and verified that I am speaking with the correct person using two identifiers. I discussed the limitations of this type of visit. The patient expressed understanding and agreed to proceed.  Planned surgery: RYGB Pt expectation of surgery: to be at a decent weight, where she feels better.  4 out of 6 SWL Appointments    NUTRITION ASSESSMENT   Anthropometrics  Start weight at [facility]: 398.6 lbs (date: 02/14/2023)  Height: 69.5 in Weight today: virtual visit (pt reports her last weight was 344 lbs at the doctors office) BMI: 50.83 kg/m2     Clinical   Pharmacotherapy: History of weight loss medication used: n/a  Medical hx: obesity, asthma, seasonal allergies, food allergies, GERD Medications: iron infusions, acetazolamide, adalimumab, albuterol, amlodipine, arnuity ellipta, atropine, azelastine, Azelex, bimatroprost, brimonidine, cetirizine, clinadamycin, dorzolamide-timolol, epinephrine, famotidine, fluticasone, gamunex-C, meloxicam, mometasone, montelukast, mycophenolate, prednisolone acetate, retin-A micro, sirolimus, sucralfate, allopurinol, indomethacin, colchicine Labs: A1c 5.7; triglycerides 182; HDL 34; LDL 110; Chol/HDL Ratio 5.2 Notable signs/symptoms: none noted Any previous deficiencies? No  Lifestyle & Dietary Hx  Pt states she recently was diagnosed with gout, stating it came out of nowhere. Pt states she is on allopurinol, indomethacin and colchicine now. Pt states since the flare ups she states she is still trying to move, stating the medications help her to move more. Physical therapy as well. Pt states her current weight at the doctors office on 05/29/2023 is 344 lbs. stating that the highest she was before the bariatric pathway was 437 lbs. Pt states she is getting about 40-50 grams of protein per day. Pt states she is  chewing more and slowing down and meal time. Pt states she has made not drinking with meals a habit. Pt states she will definitely be in-person next visit.  Estimated daily fluid intake: 64 oz Supplements:  Current average weekly physical activity: ADLs; water aerobics and rehab once a week (planning for 8 weeks)  24-Hr Dietary Recall First Meal: skip or oatmeal with fruit or eggs or grits or strawberries and cottage cheese and boiled egg Snack:  Second Meal: tuna protein crackers Snack: deli meat, cottage cheese Third Meal: tuna and steamed carrots Snack: pop cycle (outshine zero sugar) Beverages: water with ice, plain water or water with crystal light, un-sweet hot tea  Estimated Energy Needs Calories: 1500  NUTRITION DIAGNOSIS  Overweight/obesity ([place]-3.3) related to past poor dietary habits and physical inactivity as evidenced by patient w/ planned RYGB surgery following dietary guidelines for continued weight loss.  NUTRITION INTERVENTION  Nutrition counseling (C-1) and education (E-2) to facilitate bariatric surgery goals.  Pre-Op Goals Reviewed with the Patient Encouraged patient to honor their body's internal hunger and fullness cues.  Throughout the day, check in mentally and rate hunger. Stop eating when satisfied not full regardless of how much food is left on the plate.  Get more if still hungry 20-30 minutes later.  The key is to honor satisfaction so throughout the meal, rate fullness factor and stop when comfortably satisfied not physically full. The key is to honor hunger and fullness without any feelings of guilt or shame.  Pay attention to what the internal cues are, rather than any external factors. This will enhance the confidence you have in listening to your own body and following those internal cues enabling you to increase how often you eat when you are hungry not out  of appetite and stop when you are satisfied not full.  Encouraged pt to continue to eat balanced  meals inclusive of non starchy vegetables 2 times a day 7 days a week Encouraged pt to continue to drink a minium 64 fluid ounces with half being plain water to satisfy proper hydration  Why you need complex carbohydrates: Whole grains and other complex carbohydrates are required to have a healthy diet. Whole grains provide fiber which can help with blood glucose levels and help keep you satiated. Fruits and starchy vegetables provide essential vitamins and minerals required for immune function, eyesight support, brain support, bone density, wound healing and many other functions within the body. According to the current evidenced based 2020-2025 Dietary Guidelines for Americans, complex carbohydrates are part of a healthy eating pattern which is associated with a decreased risk for type 2 diabetes, cancers, and cardiovascular disease.   Purpose of protein: Every cell in your body has protein. Protein is essential for the structure, function and regulation of tissues and organs within the body. Without protein enzymes and antibodies would not exist, and cells would lack storage, transportation, and messenger systems. According to [facility]. [employer], the body is made up of at least 10000 different proteins. Lack of protein can lead to growth failure in children, loss of muscle mass, decreased immune system function, and overall weakening of various organs in the body.  SearchEngineCritic.nl, DoubleProperty.com.cy, PokerProtocol.pl   Pre-Op Goals Progress & New Goals Continue: to avoid skipping meals; eat every 3-5 hours. Continue: avoiding simple carbohydrates; find complex carbohydrate options like whole grains, whole fruits, unprocessed starchy vegetables. Continue: increase non-starchy vegetables, aim for 2 or more servings per day. Continue: Practice not drinking with  your meals. Continue: small frequent scheduled meals and snacks. Continue: walk 15-30 minutes 3-4 times per week. New: track protein; aim 60 grams per day; include protein with every meal and snack  Handouts Provided Include    Learning Style & Readiness for Change Teaching method utilized: Visual & Auditory  Demonstrated degree of understanding via: Teach Back  Readiness Level: preparation Barriers to learning/adherence to lifestyle change: nothing identified  RD's Notes for Next Visit Patient progress toward chosen goals   MONITORING & EVALUATION Dietary intake, weekly physical activity, body weight, and pre-op goals in 1 month.   Next Steps  Patient is to return to [facility] in 1 month for next SWL visit.

## 2023-06-02 ENCOUNTER — Inpatient Hospital Stay (HOSPITAL_COMMUNITY): Admission: RE | Admit: 2023-06-02 | Payer: Medicare HMO | Source: Ambulatory Visit

## 2023-06-02 ENCOUNTER — Ambulatory Visit (INDEPENDENT_AMBULATORY_CARE_PROVIDER_SITE_OTHER): Payer: Self-pay | Admitting: *Deleted

## 2023-06-02 DIAGNOSIS — J309 Allergic rhinitis, unspecified: Secondary | ICD-10-CM | POA: Diagnosis not present

## 2023-06-02 NOTE — Telephone Encounter (Signed)
 Patient's mother returns call to nurse line @[phone] . She reports that patient has been taking allopurinol and colchicine and has not had any relief from pain.   Advised that I would forward to provider for next steps.   UC precautions discussed for over the weekend.   Mother calls again at 71. She is asking if provider could send her something else in for pain. Advised that I would send message to provider,however, unsure if this could be done without another appointment. Scheduled appointment on Tuesday afternoon for follow up.   Mother would like for patient to receive "some medication for pain" this weekend as she would like to avoid going to urgent care. She states that patient was advised not to take ibuprofen due to upcoming gastric sleeve.   Veronda Prude, RN

## 2023-06-05 ENCOUNTER — Ambulatory Visit (INDEPENDENT_AMBULATORY_CARE_PROVIDER_SITE_OTHER): Admitting: Family Medicine

## 2023-06-05 VITALS — BP 108/78 | HR 99 | Ht 69.0 in | Wt 341.0 lb

## 2023-06-05 DIAGNOSIS — G629 Polyneuropathy, unspecified: Secondary | ICD-10-CM

## 2023-06-05 DIAGNOSIS — G63 Polyneuropathy in diseases classified elsewhere: Secondary | ICD-10-CM

## 2023-06-05 MED ORDER — GABAPENTIN 300 MG PO CAPS: 300.0000 mg | ORAL_CAPSULE | Freq: Every evening | ORAL | 3 refills | Status: DC

## 2023-06-05 NOTE — Progress Notes (Signed)
    SUBJECTIVE:   CHIEF COMPLAINT / HPI:   Bilateral feet pain Presents with mother, expressed frustration about ongoing feet pain.  Has had multiple recent office visits and podiatry consultation, thought to have ongoing gout flare.  Treated with allopurinol, colchicine, indomethacin and prednisone packs.  Received intra-articular steroid injection that mildly helped but cannot get another one until 06/13/2023.  Requesting to be admitted to the hospital for pain.  Today describes pain as shooting and burning.  Feels like she cannot walk due to discomfort.  Not sure if anything has helped her symptoms.  Unable to sleep.  PERTINENT  PMH / PSH: HTN, allergies, GERD, eczema, gout  OBJECTIVE:   BP 108/78   Pulse 99   Ht 5\' 9"  (1.753 m)   Wt (!) 341 lb (154.7 kg)   LMP 05/15/2023 (Approximate)   SpO2 100%   BMI 50.36 kg/m    General: Frustrated, grimacing throughout exam with movement.  Transported out via wheelchair at patient request. MSK: Normal-appearing feet bilaterally without erythema, warmth or joint enlargement.  2+ dorsalis pedis and posterior tibialis pulses, good distal cap refill.  Extreme discomfort with taking off socks bilaterally and minimal palpation.  No discrete area of pain but appears worse in toes.   ASSESSMENT/PLAN:   Assessment & Plan Peripheral polyneuropathy Unclear cause of such severe pain, appears almost like allodynia therefore most concerning for neuropathic origin.  No evidence of gout upon exam, can continue gout medications but unlikely in flare.  Will refer to neurology at patient request given symptom severity and treat symptomatically.  Given prior elevated ESR, may warrant autoimmune workup given severity of pain. -Referral to neurology -Start gabapentin 300 mg nightly, may need to titrate to effect -Consider repeat ESR/CRP and autoimmune workup at follow-up -F/u in 2 weeks with PCP   Dr. Elberta Fortis, DO [facility]

## 2023-06-05 NOTE — Assessment & Plan Note (Signed)
>>  ASSESSMENT AND PLAN FOR NEUROPATHY WRITTEN ON 06/05/2023 12:56 PM BY HERNANDEZ, KATIE, MD  Unclear cause of such severe pain, appears almost like allodynia therefore most concerning for neuropathic origin.  No evidence of gout upon exam, can continue gout medications but unlikely in flare.  Will refer to neurology at patient request given symptom severity and treat symptomatically.  Given prior elevated ESR, may warrant autoimmune workup given severity of pain. -Referral to neurology -Start gabapentin  300 mg nightly, may need to titrate to effect -Consider repeat ESR/CRP and autoimmune workup at follow-up -F/u in 2 weeks with PCP

## 2023-06-05 NOTE — Patient Instructions (Signed)
 It was wonderful to see you today! Thank you for choosing [facility].   Please bring ALL of your medications with you to every visit.   Today we talked about:  I referred you to a Neurologist as it seems your pain may be neuropathic.  Office will call you with this information.  I am starting you on gabapentin 300 mg at night.  I need to start you at this dose to make sure you tolerate the medication appropriately but we could possibly increase it if it provides relief of your symptoms.  Please continue to take your other medications for gout and follow-up with the podiatrist as scheduled.  Please follow up in 2 weeks  Call the clinic at [phone] if your symptoms worsen or you have any concerns.  Please be sure to schedule follow up at the front desk before you leave today.   Elberta Fortis, DO Family Medicine

## 2023-06-05 NOTE — Assessment & Plan Note (Signed)
 Unclear cause of such severe pain, appears almost like allodynia therefore most concerning for neuropathic origin.  No evidence of gout upon exam, can continue gout medications but unlikely in flare.  Will refer to neurology at patient request given symptom severity and treat symptomatically.  Given prior elevated ESR, may warrant autoimmune workup given severity of pain. -Referral to neurology -Start gabapentin 300 mg nightly, may need to titrate to effect -Consider repeat ESR/CRP and autoimmune workup at follow-up -F/u in 2 weeks with PCP

## 2023-06-05 NOTE — Assessment & Plan Note (Signed)
>>  ASSESSMENT AND PLAN FOR POLYNEUROPATHY ASSOCIATED WITH UNDERLYING DISEASE (HCC) WRITTEN ON 09/18/2023  8:43 AM BY Matteson Blue M, DO   >>ASSESSMENT AND PLAN FOR NEUROPATHY WRITTEN ON 06/05/2023 12:56 PM BY HERNANDEZ, KATIE, MD  Unclear cause of such severe pain, appears almost like allodynia therefore most concerning for neuropathic origin.  No evidence of gout upon exam, can continue gout medications but unlikely in flare.  Will refer to neurology at patient request given symptom severity and treat symptomatically.  Given prior elevated ESR, may warrant autoimmune workup given severity of pain. -Referral to neurology -Start gabapentin  300 mg nightly, may need to titrate to effect -Consider repeat ESR/CRP and autoimmune workup at follow-up -F/u in 2 weeks with PCP

## 2023-06-05 NOTE — Telephone Encounter (Signed)
 Called to discuss pain with patient. She notes that she has pain in all of her toes and has been taking the indomethecin and colchicine without relief. She stopped taking the allopurinol.   Discussed my concern that gout usually doesn't affect all of the toes, but could, and that I know she had elevated labs for uric acid.  Next line in Tx would consider glucocorticoids. Patient has f/u appt on Tue afternoon.   Discussed that there was nothing I could really offer her now, but we would see if we needed to switch therapy or see if something else was going on.   Also discussed taking 1000 mg of Tylenol every 8 hours.   Patient appreciative of call and agreed with plan.

## 2023-06-06 ENCOUNTER — Encounter (HOSPITAL_COMMUNITY): Admission: RE | Admit: 2023-06-06 | Source: Ambulatory Visit

## 2023-06-06 ENCOUNTER — Encounter (HOSPITAL_COMMUNITY): Payer: Self-pay

## 2023-06-06 ENCOUNTER — Ambulatory Visit

## 2023-06-12 ENCOUNTER — Ambulatory Visit (INDEPENDENT_AMBULATORY_CARE_PROVIDER_SITE_OTHER): Admitting: Podiatry

## 2023-06-12 DIAGNOSIS — M7752 Other enthesopathy of left foot: Secondary | ICD-10-CM

## 2023-06-12 DIAGNOSIS — M792 Neuralgia and neuritis, unspecified: Secondary | ICD-10-CM

## 2023-06-12 DIAGNOSIS — M7751 Other enthesopathy of right foot: Secondary | ICD-10-CM | POA: Diagnosis not present

## 2023-06-12 MED ORDER — DEXAMETHASONE SODIUM PHOSPHATE 120 MG/30ML IJ SOLN
4.0000 mg | Freq: Once | INTRAMUSCULAR | Status: AC
  Administered 2023-06-12: 4 mg via INTRA_ARTICULAR

## 2023-06-12 MED ORDER — TRIAMCINOLONE ACETONIDE 10 MG/ML IJ SUSP
2.5000 mg | Freq: Once | INTRAMUSCULAR | Status: AC
  Administered 2023-06-12: 2.5 mg via INTRA_ARTICULAR

## 2023-06-12 MED ORDER — HYDROCODONE-ACETAMINOPHEN 5-325 MG PO TABS: 1.0000 | ORAL_TABLET | ORAL | 0 refills | Status: DC | PRN

## 2023-06-12 NOTE — Progress Notes (Signed)
  Subjective:  Patient ID: Tiffany Velasquez, female    DOB: June 03, 1995,   MRN: [id number]  No chief complaint on file.   28 y.o. female presents for continue significant pain in both of her feet. Relates now feelings like she is walking on something or "concrete" is in her foot. She relates burning pains going up the legs. Relates dififculty with standing and walking and has no balance. Uric acid was elevated and has now started allopurinol. PCP also started her on gabapentin and referral to neurology placed for concern of nerve type pain.  Denies any lower back pain or diabetes. Does have a family history of gout.   Denies any other pedal complaints. Denies n/v/f/c.   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 CFT <3 seconds. Normal hair growth on digits. No edema.  Skin. No lacerations or abrasions bilateral feet.  Musculoskeletal: MMT 5/5 bilateral lower extremities in DF, PF, Inversion and Eversion. Deceased ROM in DF of ankle joint. Bilateral pain circumferentially around the feet and ankle today. No pin point spot worse than another. Pes planus noted bilateral.  Neurological: Sensation intact to light touch. Sensitivity with tinel test.   Assessment:   1. Capsulitis of toe of left foot   2. Capsulitis of toe of right foot   3. Neuritis        Plan:  Patient was evaluated and treated and all questions answered. -Xrays reviewed. Pes planus noted to bilateral feet with collapse of medial arch. No acute fractures or dislocations noted.  -Discussed treatement options for potential capsulitis/gouty arthritis and gout education provided. -Discussed injection today procedure below. Discussed no guarantee this will improve pain as I believe the gout has cleared up and this pain seems to  be more nerve relate will be interested to see what neurology can suggest. Will try sinus tarsi injection at patient request.  -Did sent in 5 days of Norco to help her with pain until she can be seen.  -Referral to pain management placed given pain out of proportion and at patients request.  -Uric acid was elevated  6.9 -Will hold off on NSAIDS and steroids.  -Discussed diet and modifications.  -Advised patient to call if symptoms are not improved -Patient to return as needed for return of pain.   Procedure: Injection Tendon/Ligament Discussed alternatives, risks, complications and verbal consent was obtained.  Location: Bilateral sinus tarsi injection . Skin Prep: Alcohol. Injectate: 1cc 0.5% marcaine plain, 1 cc dexamethasone 0.5 cc kenalog  Disposition: Patient tolerated procedure well. Injection site dressed with a band-aid.  Post-injection care was discussed and return precautions discussed.      Louann Sjogren, DPM

## 2023-06-13 ENCOUNTER — Ambulatory Visit: Payer: Medicare HMO | Admitting: Podiatry

## 2023-06-14 ENCOUNTER — Encounter: Payer: Self-pay | Admitting: Neurology

## 2023-06-14 ENCOUNTER — Telehealth: Payer: Self-pay

## 2023-06-14 NOTE — Telephone Encounter (Signed)
 Left patient a voicemail asking to confirm wether or not she will be coming in for her appt with Marchelle Folks since she was seen a couple of weeks ago and referred back to Dr. Adela Lank. Patient already has an appt with Dr. Adela Lank on 08/04/23.

## 2023-06-15 ENCOUNTER — Ambulatory Visit: Payer: Medicare HMO | Admitting: Physician Assistant

## 2023-06-16 ENCOUNTER — Encounter (HOSPITAL_COMMUNITY): Payer: Self-pay

## 2023-06-16 ENCOUNTER — Other Ambulatory Visit: Payer: Self-pay

## 2023-06-16 ENCOUNTER — Emergency Department (HOSPITAL_COMMUNITY)
Admission: EM | Admit: 2023-06-16 | Discharge: 2023-06-16 | Disposition: A | Discharge status: Home / Self Care | Attending: Emergency Medicine | Admitting: Emergency Medicine

## 2023-06-16 ENCOUNTER — Emergency Department (HOSPITAL_COMMUNITY)

## 2023-06-16 DIAGNOSIS — Z6841 Body Mass Index (BMI) 40.0 and over, adult: Secondary | ICD-10-CM | POA: Diagnosis not present

## 2023-06-16 DIAGNOSIS — Z79899 Other long term (current) drug therapy: Secondary | ICD-10-CM | POA: Insufficient documentation

## 2023-06-16 DIAGNOSIS — I10 Essential (primary) hypertension: Secondary | ICD-10-CM | POA: Insufficient documentation

## 2023-06-16 DIAGNOSIS — E669 Obesity, unspecified: Secondary | ICD-10-CM | POA: Diagnosis not present

## 2023-06-16 DIAGNOSIS — M2012 Hallux valgus (acquired), left foot: Secondary | ICD-10-CM | POA: Diagnosis not present

## 2023-06-16 DIAGNOSIS — Z91010 Allergy to peanuts: Secondary | ICD-10-CM | POA: Diagnosis not present

## 2023-06-16 DIAGNOSIS — M79673 Pain in unspecified foot: Secondary | ICD-10-CM | POA: Insufficient documentation

## 2023-06-16 DIAGNOSIS — G8929 Other chronic pain: Secondary | ICD-10-CM | POA: Diagnosis not present

## 2023-06-16 DIAGNOSIS — M79671 Pain in right foot: Secondary | ICD-10-CM | POA: Diagnosis not present

## 2023-06-16 DIAGNOSIS — M79672 Pain in left foot: Secondary | ICD-10-CM | POA: Diagnosis not present

## 2023-06-16 DIAGNOSIS — M2011 Hallux valgus (acquired), right foot: Secondary | ICD-10-CM | POA: Diagnosis not present

## 2023-06-16 LAB — URIC ACID: Uric Acid, Serum: 6.3 mg/dL (ref 2.5–7.1)

## 2023-06-16 LAB — COMPREHENSIVE METABOLIC PANEL
ALT: 48 U/L — ABNORMAL HIGH (ref 0–44)
AST: 68 U/L — ABNORMAL HIGH (ref 15–41)
Albumin: 3.8 g/dL (ref 3.5–5.0)
Alkaline Phosphatase: 40 U/L (ref 38–126)
Anion gap: 9 (ref 5–15)
BUN: 14 mg/dL (ref 6–20)
CO2: 22 mmol/L (ref 22–32)
Calcium: 9.1 mg/dL (ref 8.9–10.3)
Chloride: 105 mmol/L (ref 98–111)
Creatinine, Ser: 0.69 mg/dL (ref 0.44–1.00)
GFR, Estimated: >60 mL/min (ref >60)
Glucose, Bld: 96 mg/dL (ref 70–99)
Potassium: 3.1 mmol/L — ABNORMAL LOW (ref 3.5–5.1)
Sodium: 136 mmol/L (ref 135–145)
Total Bilirubin: 0.7 mg/dL (ref 0.0–1.2)
Total Protein: 8.4 g/dL — ABNORMAL HIGH (ref 6.5–8.1)

## 2023-06-16 LAB — CBC WITH DIFFERENTIAL/PLATELET
Abs Immature Granulocytes: 0.04 10*3/uL (ref 0.00–0.07)
Basophils Absolute: 0 10*3/uL (ref 0.0–0.1)
Basophils Relative: 0 %
Eosinophils Absolute: 0 10*3/uL (ref 0.0–0.5)
Eosinophils Relative: 0 %
HCT: 35.7 % — ABNORMAL LOW (ref 36.0–46.0)
Hemoglobin: 10.9 g/dL — ABNORMAL LOW (ref 12.0–15.0)
Immature Granulocytes: 1 %
Lymphocytes Relative: 36 %
Lymphs Abs: 1.8 10*3/uL (ref 0.7–4.0)
MCH: 27.5 pg (ref 26.0–34.0)
MCHC: 30.5 g/dL (ref 30.0–36.0)
MCV: 90.2 fL (ref 80.0–100.0)
Monocytes Absolute: 0.5 10*3/uL (ref 0.1–1.0)
Monocytes Relative: 10 %
Neutro Abs: 2.6 10*3/uL (ref 1.7–7.7)
Neutrophils Relative %: 53 %
Platelets: 263 10*3/uL (ref 150–400)
RBC: 3.96 MIL/uL (ref 3.87–5.11)
RDW: 24.9 % — ABNORMAL HIGH (ref 11.5–15.5)
WBC: 4.9 10*3/uL (ref 4.0–10.5)
nRBC: 0.4 % — ABNORMAL HIGH (ref 0.0–0.2)

## 2023-06-16 MED ORDER — OXYCODONE-ACETAMINOPHEN 5-325 MG PO TABS
1.0000 | ORAL_TABLET | Freq: Once | ORAL | Status: AC
  Administered 2023-06-16: 1 via ORAL
  Filled 2023-06-16: qty 1

## 2023-06-16 MED ORDER — OXYCODONE HCL 5 MG PO TABS: 5.0000 mg | ORAL_TABLET | Freq: Four times a day (QID) | ORAL | 0 refills | Status: DC | PRN

## 2023-06-16 NOTE — Discharge Instructions (Addendum)
 We evaluated you for your foot pain.  Your testing in the emergency department is reassuring.  Your x-rays were negative.  Please follow-up with your podiatrist.  If you do not want to follow-up with your podiatrist again, you can also follow-up with orthopedics.  You can call Dr. Shon Baton to schedule a follow-up appointment with orthopedics.  We have prescribed you a short course of oxycodone.  You can take this as needed for severe pain uncontrolled by Tylenol and Motrin.  Do not drive or drink alcohol when taking this medication.    If you develop any new symptoms such as a rash, wound on your foot, color change, or any other concerning symptoms, please return to the emergency department.

## 2023-06-16 NOTE — ED Provider Notes (Signed)
  EMERGENCY DEPARTMENT AT [facility] Provider Note  CSN: [id number] Arrival date & time: 06/16/23 1051  Chief Complaint(s) Foot Pain  HPI Tiffany Velasquez is a 28 y.o. female with history of obesity presenting to the emergency department with bilateral foot pain.  Patient reports bilateral burning foot pain to both feet.  Has been seen by podiatry, had testing including x-rays, laboratory tests without clear cause.  Patient denies ever having any trauma to her feet.  Has referral for neurology for consideration of peripheral neuropathy.  Patient reports trouble ambulating due to the pain.  No fevers or chills, rash, wounds.   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 Authorizing Provider  oxyCODONE (ROXICODONE) 5 MG immediate release tablet Take 1 tablet (5 mg total) by mouth every 6 (six) hours as needed for severe pain (pain score 7-10). 06/16/23  Yes Lonell Grandchild, MD  acetaZOLAMIDE (DIAMOX) 250 MG tablet  Take 250 mg by mouth 2 (two) times daily. 01/17/23   [provider]  Adalimumab 40 MG/0.4ML PNKT Inject into the skin. 03/03/20   [provider]  albuterol (PROVENTIL) (2.5 MG/3ML) 0.083% nebulizer solution Take 3 mLs (2.5 mg total) by nebulization every 6 (six) hours as needed for wheezing or shortness of breath. 04/21/23   Alfonse Spruce, MD  albuterol (VENTOLIN HFA) 108 (90 Base) MCG/ACT inhaler Inhale 2 puffs every 4-6 hours as needed for cough, wheeze, tightness in chest, or shortness of breath 11/07/22   Ambs, Norvel Richards, FNP  allopurinol (ZYLOPRIM) 100 MG tablet Take 1 tablet (100 mg total) by mouth daily. 05/29/23   Lincoln Brigham, MD  ARNUITY ELLIPTA 100 MCG/ACT AEPB Inhale 2 puffs into the lungs in the morning and at bedtime. 01/13/23   [provider]  azelastine (ASTELIN) 0.1 % nasal spray USE 2 SPRAYS IN EACH NOSTRIL TWICE DAILY AS NEEDED FOR RUNNY NOSE/DRAINAGE DOWN THROAT 11/23/22   Alfonse Spruce, MD  AZELEX 20 % cream Apply topically daily. 06/28/21   [provider]  azithromycin (ZITHROMAX) 250 MG tablet Take 1 tablet (250 mg total) by mouth daily. Take first 2 tablets together, then 1 every day until finished. 04/19/23   Netta Corrigan, PA-C  bimatoprost (LUMIGAN) 0.01 % SOLN Apply to eye. 11/01/22   [provider]  brimonidine (ALPHAGAN) 0.15 % ophthalmic solution 1 drop 3 (three) times daily. 02/01/20   [provider]  budesonide-formoterol (SYMBICORT) 160-4.5 MCG/ACT inhaler Inhale 2 puffs into the lungs in the morning and at bedtime. 11/23/22   Dellis Anes,  Hetty Ely, MD  clindamycin (CLEOCIN T) 1 % lotion Apply topically 2 (two) times daily. 08/17/15   [provider]  colchicine 0.6 MG tablet Take 1 tablet (0.6 mg total) by mouth daily. 05/29/23   Lincoln Brigham, MD  dexlansoprazole (DEXILANT) 60 MG capsule Take 1 capsule (60 mg total) by mouth daily. 05/25/23   Doree Albee, PA-C  dorzolamide-timolol (COSOPT)  2-0.5 % ophthalmic solution 1 drop 2 (two) times daily.    [provider]  famotidine (PEPCID) 40 MG tablet Take 1 tablet (40 mg total) by mouth at bedtime. 05/25/23   Doree Albee, PA-C  fluticasone (FLONASE) 50 MCG/ACT nasal spray Place 1 spray into both nostrils daily. 11/07/22   Hetty Blend, FNP  gabapentin (NEURONTIN) 300 MG capsule Take 1 capsule (300 mg total) by mouth at bedtime. 06/05/23   Elberta Fortis, MD  GAMUNEX-C 20 GM/200ML SOLN  05/20/22   [provider]  indomethacin (INDOCIN SR) 75 MG CR capsule Take 1 capsule (75 mg total) by mouth daily with breakfast. 05/22/23 06/21/23  Louann Sjogren, DPM  loratadine (CLARITIN) 10 MG tablet Take 1 tablet (10 mg total) by mouth daily. 03/08/23   Alfredo Martinez, MD  metoCLOPramide (REGLAN) 10 MG tablet Take 1 tablet (10 mg total) by mouth every 8 (eight) hours as needed for nausea. 04/17/23   Gloris Manchester, MD  mometasone (ELOCON) 0.1 % cream Apply 1 Application topically 2 (two) times daily. 11/23/22   Alfonse Spruce, MD  montelukast (SINGULAIR) 10 MG tablet Take 1 tablet (10 mg total) by mouth at bedtime. 11/23/22   Alfonse Spruce, MD  Olmesartan-amLODIPine-HCTZ 20-5-12.5 MG TABS Take 1 tablet by mouth daily. 05/22/23   Levin Erp, MD  ondansetron (ZOFRAN) 8 MG tablet Take 1 tablet (8 mg total) by mouth every 8 (eight) hours as needed for nausea or vomiting. 05/25/23   Doree Albee, PA-C  polyethylene glycol powder (GLYCOLAX/MIRALAX) 17 GM/SCOOP powder Take 17 g by mouth daily. 05/10/23   Bess Kinds, MD  potassium chloride SA (KLOR-CON M) 20 MEQ tablet Take 1 tablet (20 mEq total) by mouth 2 (two) times daily for 3 days. 04/17/23 04/20/23  Gloris Manchester, MD  RETIN-A MICRO 0.04 % gel Apply 1 application  topically daily. 06/28/21   [provider]  sirolimus (RAPAMUNE) 2 MG tablet Take by mouth. 05/13/21   [provider]  sucralfate (CARAFATE) 1 GM/10ML suspension Take 10 mLs (1 g total)  by mouth 4 (four) times daily -  with meals and at bedtime. 05/25/23   Doree Albee, PA-C                                                                                                                                    Past Surgical History Past Surgical History:  Procedure Laterality Date   BIOPSY  04/04/2023   Procedure: BIOPSY;  Surgeon: Quentin Ore, MD;  Location: [facility];  Service: General;;   CATARACT EXTRACTION     ESOPHAGOGASTRODUODENOSCOPY N/A 04/04/2023   Procedure: ESOPHAGOGASTRODUODENOSCOPY (EGD);  Surgeon: Quentin Ore, MD;  Location: [facility];  Service: General;  Laterality: N/A;   no past surgery     PORTA CATH INSERTION     Family History Family History  Problem Relation Age of Onset   Asthma Mother    Allergic rhinitis Father    Sudden death Cousin    Eczema Neg Hx    Urticaria Neg Hx     Social History Social History   Tobacco Use   Smoking status: Never   Smokeless tobacco: Never  Vaping Use   Vaping status: Never Used  Substance Use Topics   Alcohol use: Yes    Comment: rarely   Drug use: No   Allergies Other, Peanut-containing drug products, Shellfish allergy, Apple juice, Orange fruit [citrus], Peach flavoring agent (non-screening), Tomato, and Tree extract  Review of Systems Review of Systems  All other systems reviewed and are negative.   Physical Exam Vital Signs  I have reviewed the triage vital signs BP (!) 155/108 (BP Location: Right Arm)   Pulse 99   Temp 97.8 F (36.6 C) (Oral)   Resp 18   Ht 5\' 9"  (1.753 m) Comment: Simultaneous filing. User may not have seen previous data.  Wt (!) 154.2 kg Comment: Simultaneous filing. User may not have seen previous data.  LMP 05/15/2023 (Approximate)   SpO2 99%   BMI 50.21 kg/m  Physical Exam Vitals and nursing note reviewed.  Constitutional:      Appearance: Normal appearance. She is obese.  HENT:     Head: Normocephalic and atraumatic.     Mouth/Throat:      Mouth: Mucous membranes are moist.  Eyes:     Conjunctiva/sclera: Conjunctivae normal.  Cardiovascular:     Rate and Rhythm: Normal rate.     Pulses:          Dorsalis pedis pulses are 2+ on the right side and 2+ on the left side.  Pulmonary:     Effort: Pulmonary effort is normal. No respiratory distress.  Abdominal:     General: Abdomen is flat.  Musculoskeletal:        General: No deformity.     Comments: No focal wounds to bilateral feet, normal capillary refill, no swelling, no deformity, no color change.  No focal tenderness or bony tenderness to the feet  Skin:    General: Skin is warm and dry.     Capillary Refill: Capillary refill takes less than 2 seconds.  Neurological:     General: No focal deficit present.     Mental Status: She is alert. Mental status is at baseline.  Psychiatric:        Mood and Affect: Mood normal.        Behavior: Behavior normal.     ED Results and Treatments Labs (all labs ordered are listed, but only abnormal results are displayed) Labs Reviewed  CBC WITH DIFFERENTIAL/PLATELET - Abnormal; Notable for the following components:      Result Value   Hemoglobin 10.9 (*)    HCT 35.7 (*)    RDW 24.9 (*)    nRBC 0.4 (*)    All other components within normal limits  COMPREHENSIVE METABOLIC PANEL - Abnormal; Notable for the following components:   Potassium 3.1 (*)    Total Protein 8.4 (*)    AST 68 (*)  ALT 48 (*)    All other components within normal limits  URIC ACID                                                                                                                          Radiology DG Foot Complete Right Result Date: 06/16/2023 CLINICAL DATA:  foot pain. EXAM: RIGHT FOOT COMPLETE - 3+ VIEW; LEFT FOOT - COMPLETE 3+ VIEW COMPARISON:  None Available. FINDINGS: No acute fracture or dislocation. No aggressive osseous lesion. Mild-to-moderate bilateral hallux valgus deformity noted. No significant arthritis of imaged joints. No  focal soft tissue swelling. No radiopaque foreign bodies. IMPRESSION: *No acute osseous abnormality of bilateral feet. *Mild-to-moderate bilateral hallux valgus deformity. Electronically Signed   By: Jules Schick M.D.   On: 06/16/2023 12:40   DG Foot Complete Left Result Date: 06/16/2023 CLINICAL DATA:  foot pain. EXAM: RIGHT FOOT COMPLETE - 3+ VIEW; LEFT FOOT - COMPLETE 3+ VIEW COMPARISON:  None Available. FINDINGS: No acute fracture or dislocation. No aggressive osseous lesion. Mild-to-moderate bilateral hallux valgus deformity noted. No significant arthritis of imaged joints. No focal soft tissue swelling. No radiopaque foreign bodies. IMPRESSION: *No acute osseous abnormality of bilateral feet. *Mild-to-moderate bilateral hallux valgus deformity. Electronically Signed   By: Jules Schick M.D.   On: 06/16/2023 12:40    Pertinent labs & imaging results that were available during my care of the patient were reviewed by me and considered in my medical decision making (see MDM for details).  Medications Ordered in ED Medications  oxyCODONE-acetaminophen (PERCOCET/ROXICET) 5-325 MG per tablet 1 tablet (1 tablet Oral Given 06/16/23 1219)                                                                                                                                     Procedures Procedures  (including critical care time)  Medical Decision Making / ED Course   MDM:  28 year old presenting to the emergency department with foot pain bilaterally for over a month.    Patient examination without focal findings.  Pulses intact.  Normal capillary refill.  No wounds or swelling.  X-ray is negative with no evidence of fracture.  Extremely low concern for occult fracture with no trauma.  Patient has already been referred to neurology out of consideration that this could be peripheral neuropathy.  Patient denies any upper extremity symptoms.  Unclear cause of her symptoms  but suspect most likely cause is  probably peripheral neuropathy.  Since she has been disappointed with her podiatry care, recommended that she could follow-up with orthopedic surgery for second opinion.  Will prescribe short course of oxycodone, was previously prescribed Norco by podiatry but reports that it caused itching.  Advised to discontinue Norco.  Recommended not to drive or drink alcohol with taking oxycodone.  Will discharge patient to home. All questions answered. Patient comfortable with plan of discharge. Return precautions discussed with patient and specified on the after visit summary.        Additional history obtained:  -External records from outside source obtained and reviewed including: Chart review including previous notes, labs, imaging, consultation notes including prior notes    Lab Tests: -I ordered, reviewed, and interpreted labs.   The pertinent results include:   Labs Reviewed  CBC WITH DIFFERENTIAL/PLATELET - Abnormal; Notable for the following components:      Result Value   Hemoglobin 10.9 (*)    HCT 35.7 (*)    RDW 24.9 (*)    nRBC 0.4 (*)    All other components within normal limits  COMPREHENSIVE METABOLIC PANEL - Abnormal; Notable for the following components:   Potassium 3.1 (*)    Total Protein 8.4 (*)    AST 68 (*)    ALT 48 (*)    All other components within normal limits  URIC ACID    Notable for mild anemia    Imaging Studies ordered: I ordered imaging studies including XR foot bilateral On my interpretation imaging demonstrates no acute process I independently visualized and interpreted imaging. I agree with the radiologist interpretation   Medicines ordered and prescription drug management: Meds ordered this encounter  Medications   oxyCODONE-acetaminophen (PERCOCET/ROXICET) 5-325 MG per tablet 1 tablet    Refill:  0   oxyCODONE (ROXICODONE) 5 MG immediate release tablet    Sig: Take 1 tablet (5 mg total) by mouth every 6 (six) hours as needed for severe  pain (pain score 7-10).    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    Final Clinical Impression(s) / ED Diagnoses Final diagnoses:  Chronic foot pain, unspecified laterality     This chart was dictated using voice recognition software.  Despite best efforts to proofread,  errors can occur which can change the documentation meaning.    Lonell Grandchild, MD 06/16/23 1341

## 2023-06-16 NOTE — ED Triage Notes (Signed)
 Bilateral foot pain for over a month, pt states she was on gout medication and it is not working, saw foot specialist and they stated there was nothing else they could do for her per pt.

## 2023-06-18 DIAGNOSIS — H30033 Focal chorioretinal inflammation, peripheral, bilateral: Secondary | ICD-10-CM | POA: Diagnosis not present

## 2023-06-22 ENCOUNTER — Telehealth: Payer: Self-pay | Admitting: *Deleted

## 2023-06-22 ENCOUNTER — Other Ambulatory Visit (INDEPENDENT_AMBULATORY_CARE_PROVIDER_SITE_OTHER): Payer: Self-pay

## 2023-06-22 ENCOUNTER — Ambulatory Visit (INDEPENDENT_AMBULATORY_CARE_PROVIDER_SITE_OTHER): Admitting: Physician Assistant

## 2023-06-22 ENCOUNTER — Encounter: Payer: Self-pay | Admitting: Physician Assistant

## 2023-06-22 DIAGNOSIS — M79672 Pain in left foot: Secondary | ICD-10-CM | POA: Diagnosis not present

## 2023-06-22 DIAGNOSIS — M79671 Pain in right foot: Secondary | ICD-10-CM

## 2023-06-22 MED ORDER — HYDROCODONE-ACETAMINOPHEN 5-325 MG PO TABS: 1.0000 | ORAL_TABLET | Freq: Four times a day (QID) | ORAL | 0 refills | Status: DC | PRN

## 2023-06-22 MED ORDER — GABAPENTIN 300 MG PO CAPS: ORAL_CAPSULE | ORAL | 3 refills | Status: DC

## 2023-06-22 NOTE — Addendum Note (Signed)
 Addended by: Michaele Offer on: 06/22/2023 11:16 AM   Modules accepted: Orders

## 2023-06-22 NOTE — Telephone Encounter (Signed)
 Pt mom calling about test results from blood being taken in ED. Wants to know why dr hasn't called yet. Please advise. Tiffany Velasquez Bruna Potter, CMA

## 2023-06-22 NOTE — Progress Notes (Signed)
 Office Visit Note   Patient: Tiffany Velasquez           Date of Birth: 03/21/1996           MRN: [id number] Visit Date: 06/22/2023              Requested by: Bess Kinds, MD [address] [place],  [place] [address] PCP: Bess Kinds, MD   Assessment & Plan: Visit Diagnoses:  1. Pain in right foot   2. Pain in left foot     Plan: 28 year old woman with chronic neuropathy in her feet.  She states that she has bilateral foot pain.  She feels like cement on the bottom of her feet with associated burning and tingling pain shoots to her toes she felt because of the pain in her feet she states she cannot walk.  Sometimes she cannot feel her toes.  She has findings consistent with neuropathy.  Reviewing her chart she does have a referral for neurology to evaluate this further.  She is on gabapentin 300 mg at nighttime.  I do think she can gradually increase this to 300 mg 3 times daily as a start and can go up to 600 mg 3 times daily.  Would agree referral to neurology is appropriate.  Do not see any other orthopedic intervention at this time.  Will also refer to physical therapy at [facility] for desensitization and mobilization  Follow-Up Instructions: Return if symptoms worsen or fail to improve.   Orders:  Orders Placed This Encounter  Procedures   XR Foot Complete Right   XR Foot 2 Views Left   No orders of the defined types were placed in this encounter.     Procedures: No procedures performed   Clinical Data: No additional findings.   Subjective: Chief Complaint  Patient presents with   Right Foot - Pain    Pain in the right foot she said it feels like cement on the bottom of her foot it burns and tingles pain shoots through the toes she has fell 2 times can hardly walk with tightness in feet    Left Foot - Pain    Pain in the left foot she said it feels like cement on the bottom of her foot it burns and tingles pain shoots through the toes she has fell 2  times can hardly walk with tightness in feet     HPI patient is a 28 year old woman who comes in today with bilateral feet pain.  No known injury.  She says her feet hurt so much that often she does not want to place weight on it.  She states describes her feet feeling like cement she has fallen 2 times because of pain not weakness  Review of Systems  All other systems reviewed and are negative.    Objective: Vital Signs: There were no vitals taken for this visit.  Physical Exam Constitutional:      Appearance: Normal appearance.  Pulmonary:     Effort: Pulmonary effort is normal.  Skin:    General: Skin is warm and dry.  Neurological:     General: No focal deficit present.     Mental Status: She is alert and oriented to person, place, and time.  Psychiatric:        Mood and Affect: Mood normal.        Behavior: Behavior normal.     Ortho Exam Feet are warm she has palpable pulses she has bilateral pes  planus.  Extremely sensitive even to light touch in of her feet both dorsally and plantar hurts with range of motion no swelling no cellulitis Specialty Comments:  No specialty comments available.  Imaging: No results found.   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   Retinal edema 07/13/2018   Not well controlled moderate persistent asthma 01/16/2017   Intrinsic atopic dermatitis 01/16/2017   Hypertension 06/22/2012   Morbid obesity (HCC) 10/05/2009   Severe eczema 05/25/2006   Past Medical History:  Diagnosis Date    Asthma    Eczema    Glaucoma    Hypertension    Morbid obesity (HCC)    Uveitic glaucoma of both eyes, 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    Family History  Problem Relation Age of Onset   Asthma Mother    Allergic rhinitis Father    Sudden death Cousin    Eczema Neg Hx    Urticaria Neg Hx     Past Surgical History:  Procedure Laterality Date   BIOPSY  04/04/2023   Procedure: BIOPSY;  Surgeon: Quentin Ore, MD;  Location: [facility];  Service: General;;   CATARACT EXTRACTION     ESOPHAGOGASTRODUODENOSCOPY N/A 04/04/2023   Procedure: ESOPHAGOGASTRODUODENOSCOPY (EGD);  Surgeon: Quentin Ore, MD;  Location: [facility];  Service: General;  Laterality: N/A;   no past surgery     PORTA CATH INSERTION     Social History   Occupational History   Not on file  Tobacco Use   Smoking status: Never   Smokeless tobacco: Never  Vaping Use   Vaping status: Never Used  Substance and Sexual Activity   Alcohol use: Yes    Comment: rarely   Drug use: No   Sexual activity: Not Currently

## 2023-06-22 NOTE — Telephone Encounter (Signed)
 Patient to discuss her mother's concerns.  Patient notes that her mother was not available at that time, but call back later to get a hold of her.  Told patient to call back at the end of clinic.  Patient's mother wanting to ask question about her results, that she was seen in MyChart.  Patient reports she still hanging on but is still having pain in her toes.

## 2023-06-22 NOTE — Telephone Encounter (Signed)
 Pt mother called and discussed pt's care with mother. Mother was concerned about patient's anemia, I discussed we would recheck her in 1 month.

## 2023-06-26 ENCOUNTER — Other Ambulatory Visit: Payer: Self-pay | Admitting: Student

## 2023-06-26 DIAGNOSIS — I10 Essential (primary) hypertension: Secondary | ICD-10-CM

## 2023-06-27 ENCOUNTER — Encounter (HOSPITAL_COMMUNITY): Payer: Self-pay

## 2023-06-27 ENCOUNTER — Other Ambulatory Visit: Payer: Self-pay

## 2023-06-27 ENCOUNTER — Emergency Department (HOSPITAL_COMMUNITY)
Admission: EM | Admit: 2023-06-27 | Discharge: 2023-06-27 | Disposition: A | Discharge status: Home / Self Care | Attending: Emergency Medicine | Admitting: Emergency Medicine

## 2023-06-27 DIAGNOSIS — M79671 Pain in right foot: Secondary | ICD-10-CM | POA: Diagnosis not present

## 2023-06-27 DIAGNOSIS — M79672 Pain in left foot: Secondary | ICD-10-CM | POA: Insufficient documentation

## 2023-06-27 DIAGNOSIS — Z91010 Allergy to peanuts: Secondary | ICD-10-CM | POA: Diagnosis not present

## 2023-06-27 MED ORDER — KETOROLAC TROMETHAMINE 15 MG/ML IJ SOLN
15.0000 mg | Freq: Once | INTRAMUSCULAR | Status: AC
  Administered 2023-06-27: 15 mg via INTRAMUSCULAR
  Filled 2023-06-27: qty 1

## 2023-06-27 MED ORDER — OXYCODONE-ACETAMINOPHEN 5-325 MG PO TABS: 1.0000 | ORAL_TABLET | Freq: Four times a day (QID) | ORAL | 0 refills | Status: AC | PRN

## 2023-06-27 MED ORDER — OXYCODONE-ACETAMINOPHEN 5-325 MG PO TABS
1.0000 | ORAL_TABLET | Freq: Once | ORAL | Status: AC
  Administered 2023-06-27: 1 via ORAL
  Filled 2023-06-27: qty 1

## 2023-06-27 MED ORDER — KETOROLAC TROMETHAMINE 15 MG/ML IJ SOLN: 15.0000 mg | Freq: Once | INTRAMUSCULAR | Status: DC

## 2023-06-27 NOTE — ED Provider Notes (Signed)
 [place] EMERGENCY DEPARTMENT AT [facility] Provider Note   CSN: [id number] Arrival date & time: 06/27/23  4098     History Chronic foot pain Chief Complaint  Patient presents with   Foot Pain    Tiffany Velasquez is a 28 y.o. female.  28 y.o female with a PMH of chronic foot pain followed by Ortho care with a last visit on 06/22/2023 presents to the ED with a chief complaint of bilateral burning feet pain. Pain ongoing for four months.  Patient was previously evaluated by Ortho care, she was referred out to try foot and ankle.  According to mother who is providing most of the history, patient was started on gabapentin, feels that this is not helping any of the pain, patient states that is very painful for her to ambulate, she feels a shooting tingling pain throughout both of her feet.  Mother also states that this has had issues with her blood pressure lately.  They do report that she is weaker on her gait due to the pain.  Had a witnessed fall a couple of days ago.  No fever, no prior history of IV drug use, no weakness.   The history is provided by the patient.  Foot Pain This is a chronic problem.       Home Medications Prior to Admission medications   Medication Sig Start Date End Date Taking? Authorizing Provider  acetaZOLAMIDE (DIAMOX) 250 MG tablet Take 250 mg by mouth 2 (two) times daily. 01/17/23   [provider]  Adalimumab 40 MG/0.4ML PNKT Inject into the skin. 03/03/20   [provider]  albuterol (PROVENTIL) (2.5 MG/3ML) 0.083% nebulizer solution Take 3 mLs (2.5 mg total) by nebulization every 6 (six) hours as needed for wheezing or shortness of breath. 04/21/23   Alfonse Spruce, MD  albuterol (VENTOLIN HFA) 108 (90 Base) MCG/ACT inhaler Inhale 2 puffs every 4-6 hours as needed for cough, wheeze, tightness in chest, or shortness of breath 11/07/22   Ambs, Norvel Richards, FNP  allopurinol (ZYLOPRIM) 100 MG tablet Take 1 tablet (100 mg total) by  mouth daily. 05/29/23   Lincoln Brigham, MD  ARNUITY ELLIPTA 100 MCG/ACT AEPB Inhale 2 puffs into the lungs in the morning and at bedtime. 01/13/23   [provider]  azelastine (ASTELIN) 0.1 % nasal spray USE 2 SPRAYS IN EACH NOSTRIL TWICE DAILY AS NEEDED FOR RUNNY NOSE/DRAINAGE DOWN THROAT 11/23/22   Alfonse Spruce, MD  AZELEX 20 % cream Apply topically daily. 06/28/21   [provider]  azithromycin (ZITHROMAX) 250 MG tablet Take 1 tablet (250 mg total) by mouth daily. Take first 2 tablets together, then 1 every day until finished. 04/19/23   Netta Corrigan, PA-C  bimatoprost (LUMIGAN) 0.01 % SOLN Apply to eye. 11/01/22   [provider]  brimonidine (ALPHAGAN) 0.15 % ophthalmic solution 1 drop 3 (three) times daily. 02/01/20   [provider]  budesonide-formoterol (SYMBICORT) 160-4.5 MCG/ACT inhaler Inhale 2 puffs into the lungs in the morning and at bedtime. 11/23/22   Alfonse Spruce, MD  clindamycin (CLEOCIN T) 1 % lotion Apply topically 2 (two) times daily. 08/17/15   [provider]  colchicine 0.6 MG tablet Take 1 tablet (0.6 mg total) by mouth daily. 05/29/23   Lincoln Brigham, MD  dexlansoprazole (DEXILANT) 60 MG capsule Take 1 capsule (60 mg total) by mouth daily. 05/25/23   Doree Albee, PA-C  dorzolamide-timolol (COSOPT) 2-0.5 % ophthalmic solution 1 drop  2 (two) times daily.    [provider]  famotidine (PEPCID) 40 MG tablet Take 1 tablet (40 mg total) by mouth at bedtime. 05/25/23   Doree Albee, PA-C  fluticasone (FLONASE) 50 MCG/ACT nasal spray Place 1 spray into both nostrils daily. 11/07/22   Hetty Blend, FNP  gabapentin (NEURONTIN) 300 MG capsule Start at 300 p.o. 3 times daily.  If tolerated work up to 600 mg p.o. 3 times daily 06/22/23   Persons, [place], [place]  GAMUNEX-C 20 GM/200ML SOLN  05/20/22   [provider]  HYDROcodone-acetaminophen (NORCO/VICODIN) 5-325 MG tablet Take 1 tablet by mouth every 6 (six)  hours as needed for moderate pain (pain score 4-6). 06/22/23   Persons, [place], PA  loratadine (CLARITIN) 10 MG tablet Take 1 tablet (10 mg total) by mouth daily. 03/08/23   Alfredo Martinez, MD  metoCLOPramide (REGLAN) 10 MG tablet Take 1 tablet (10 mg total) by mouth every 8 (eight) hours as needed for nausea. 04/17/23   Gloris Manchester, MD  mometasone (ELOCON) 0.1 % cream Apply 1 Application topically 2 (two) times daily. 11/23/22   Alfonse Spruce, MD  montelukast (SINGULAIR) 10 MG tablet Take 1 tablet (10 mg total) by mouth at bedtime. 11/23/22   Alfonse Spruce, MD  Olmesartan-amLODIPine-HCTZ 20-5-12.5 MG TABS TAKE 1 TABLET BY MOUTH DAILY 06/26/23   Bess Kinds, MD  ondansetron (ZOFRAN) 8 MG tablet Take 1 tablet (8 mg total) by mouth every 8 (eight) hours as needed for nausea or vomiting. 05/25/23   Doree Albee, PA-C  polyethylene glycol powder (GLYCOLAX/MIRALAX) 17 GM/SCOOP powder Take 17 g by mouth daily. 05/10/23   Bess Kinds, MD  potassium chloride SA (KLOR-CON M) 20 MEQ tablet Take 1 tablet (20 mEq total) by mouth 2 (two) times daily for 3 days. 04/17/23 04/20/23  Gloris Manchester, MD  RETIN-A MICRO 0.04 % gel Apply 1 application  topically daily. 06/28/21   [provider]  sirolimus (RAPAMUNE) 2 MG tablet Take by mouth. 05/13/21   [provider]  sucralfate (CARAFATE) 1 GM/10ML suspension Take 10 mLs (1 g total) by mouth 4 (four) times daily -  with meals and at bedtime. 05/25/23   Doree Albee, PA-C      Allergies    Other, Peanut-containing drug products, Shellfish allergy, Apple juice, Orange fruit [citrus], Peach flavoring agent (non-screening), Tomato, and Tree extract    Review of Systems   Review of Systems  Constitutional:  Negative for fever.  Musculoskeletal:  Negative for arthralgias, back pain, gait problem, joint swelling and myalgias.    Physical Exam Updated Vital Signs BP (!) 145/121 (BP Location: Right Wrist)   Pulse (!) 105    Temp 98 F (36.7 C) (Oral)   Resp 20   SpO2 100%  Physical Exam Vitals and nursing note reviewed.  Constitutional:      Appearance: She is obese.  HENT:     Head: Normocephalic and atraumatic.     Mouth/Throat:     Mouth: Mucous membranes are moist.  Cardiovascular:     Rate and Rhythm: Normal rate.     Pulses:          Dorsalis pedis pulses are 2+ on the right side and 2+ on the left side.       Posterior tibial pulses are 2+ on the right side and 2+ on the left side.  Pulmonary:     Effort: Pulmonary effort is normal.  Abdominal:  General: Abdomen is flat.  Musculoskeletal:        General: No swelling, tenderness or deformity.     Cervical back: Normal range of motion and neck supple.  Feet:     Right foot:     Skin integrity: Skin integrity normal. No erythema or dry skin.     Toenail Condition: Right toenails are normal.     Left foot:     Skin integrity: Skin integrity normal. No erythema or dry skin.     Toenail Condition: Left toenails are normal.     Comments: Bilateral feet with 2+ pulses, good sensation throughout, good dorsi flexion and extension.  No wounds noted, or  ulcerations.  Skin:    General: Skin is warm and dry.  Neurological:     Mental Status: She is alert and oriented to person, place, and time.     ED Results / Procedures / Treatments   Labs (all labs ordered are listed, but only abnormal results are displayed) Labs Reviewed - No data to display  EKG None  Radiology No results found.  Procedures Procedures    Medications Ordered in ED Medications  oxyCODONE-acetaminophen (PERCOCET/ROXICET) 5-325 MG per tablet 1 tablet (has no administration in time range)  ketorolac (TORADOL) 15 MG/ML injection 15 mg (has no administration in time range)    ED Course/ Medical Decision Making/ A&P                                 Medical Decision Making Risk Prescription drug management.    Patient here with history of chronic bilateral  foot pain, previously evaluated by order though care, given a referral for [facility], failed of medications with gabapentin, and Percocet here with worsening pain.  Mother providing most of the history.  Exam is benign, good pulses, good capillary refill, no wounds or warmth noted.  Do not suspect cellulitis, septic joint, fractures as prior x-rays were benign for any abnormality.  I discussed with the mother given pain medication while in the emergency department, will need to follow-up with neurology, she is requesting a referral for [facility] neurology.   8:59 AM patient reassessed by me after he received pain medication approximately 30 minutes ago, she does report some improvement in her symptoms.  Mother is upset that patient has not had a thorough workup, I did discuss with her that she had imaging such as x-rays, and outpatient follow-up.  I do feel that patient needs further evaluation for her peripheral neuropathy.  She was given a short course of narcotic pain medication per her request as she keeps stating that this is the only thing that calms her pain down.  She appears in no distress at this time, return precautions discussed at length.  Patient hemodynamically stable for discharge.    Portions of this note were generated with Scientist, clinical (histocompatibility and immunogenetics). Dictation errors may occur despite best attempts at proofreading.  Final Clinical Impression(s) / ED Diagnoses Final diagnoses:  Pain in both feet    Rx / DC Orders ED Discharge Orders     None         Claude Manges, PA-C 06/27/23 0900    Pricilla Loveless, MD 06/27/23 1046

## 2023-06-27 NOTE — ED Notes (Signed)
 Patient discharged by RN. Patient verbalizes understanding of instructions with no additional questions. Patient in wheelchair to lobby due to leaving her walker at home.

## 2023-06-27 NOTE — Discharge Instructions (Signed)
 You were given a referral for [place] neurology, please schedule an appointment at your earliest convenience.  You are also given a prescription for narcotics, please take 1 tablet every 6 hours as needed for pain.  You ultimately need to follow-up with a neurologist in order to obtain further follow-up on your ongoing bilateral foot pain.

## 2023-06-27 NOTE — ED Triage Notes (Signed)
 Pt presents via POV c/o chronic bilateral foot pain. Reports burning in feet.

## 2023-06-29 ENCOUNTER — Encounter: Payer: Self-pay | Admitting: Physical Medicine & Rehabilitation

## 2023-06-30 ENCOUNTER — Ambulatory Visit (INDEPENDENT_AMBULATORY_CARE_PROVIDER_SITE_OTHER)

## 2023-06-30 DIAGNOSIS — J309 Allergic rhinitis, unspecified: Secondary | ICD-10-CM

## 2023-07-03 ENCOUNTER — Encounter: Payer: Self-pay | Admitting: Podiatry

## 2023-07-03 ENCOUNTER — Other Ambulatory Visit: Payer: Self-pay | Admitting: Podiatry

## 2023-07-03 MED ORDER — HYDROCODONE-ACETAMINOPHEN 5-325 MG PO TABS: 1.0000 | ORAL_TABLET | ORAL | 0 refills | Status: DC | PRN

## 2023-07-03 NOTE — Telephone Encounter (Addendum)
 Pts mother came to request a refill HYDROcodone-acetaminophen. States daughter is in pain and doesn't have a appt with the neurologist until Monday 4/14. States pt needs it urgently. Pts mother requested that management be made aware too to make sure she gets her medication ASAP.

## 2023-07-04 ENCOUNTER — Telehealth: Payer: Self-pay | Admitting: Radiology

## 2023-07-04 ENCOUNTER — Encounter: Payer: Self-pay | Admitting: Dietician

## 2023-07-04 ENCOUNTER — Encounter: Attending: Surgery | Admitting: Dietician

## 2023-07-04 VITALS — Ht 69.5 in | Wt 324.9 lb

## 2023-07-04 DIAGNOSIS — Z6841 Body Mass Index (BMI) 40.0 and over, adult: Secondary | ICD-10-CM | POA: Diagnosis not present

## 2023-07-04 DIAGNOSIS — Z713 Dietary counseling and surveillance: Secondary | ICD-10-CM | POA: Insufficient documentation

## 2023-07-04 DIAGNOSIS — Z01818 Encounter for other preprocedural examination: Secondary | ICD-10-CM | POA: Diagnosis present

## 2023-07-04 DIAGNOSIS — E669 Obesity, unspecified: Secondary | ICD-10-CM | POA: Insufficient documentation

## 2023-07-04 NOTE — Telephone Encounter (Signed)
 Patient's mom came into the office to inquire about a hydrocodone prescription for patient.  Originally patient and her mom were told that [place] has not prescribed this for patient previously.  This was in error.  Patient had also called [facility] and Dr Lysle Morales had sent in #10 hydrocodone for her, meaning we couldn't send any yesterday/today.  I spoke with [place] and she advises she will send another Rx in for patient to get her through until her neurology appointment this upcoming Monday.  [place] will address this Wed 07/05/23. Patient's mom aware.

## 2023-07-04 NOTE — Progress Notes (Signed)
 Supervised Weight Loss Visit Bariatric Nutrition Education  Planned surgery: RYGB Pt expectation of surgery: to be at a decent weight, where she feels better.  5 out of 6 SWL Appointments    NUTRITION ASSESSMENT   Anthropometrics  Start weight at [facility]: 398.6 lbs (date: 02/14/2023)  Height: 69.5 in Weight today: 324.9 lb BMI: 47.29 kg/m2     Clinical  Medical hx: obesity, asthma, seasonal allergies, food allergies, GERD Medications: iron infusions, acetazolamide, adalimumab, albuterol, amlodipine, arnuity ellipta, atropine, azelastine, Azelex, bimatroprost, brimonidine, cetirizine, clinadamycin, dorzolamide-timolol, epinephrine, famotidine, fluticasone, gamunex-C, meloxicam, mometasone, montelukast, mycophenolate, prednisolone acetate, retin-A micro, sirolimus, sucralfate, allopurinol, indomethacin, colchicine Labs: A1c 5.7; triglycerides 182; HDL 34; LDL 110; Chol/HDL Ratio 5.2 Notable signs/symptoms: none noted Any previous deficiencies? No Pt states she is allergic to apples oranges peaches, shellfish, peanuts  Lifestyle & Dietary Hx  Pt arrived in a wheel chair, stating she has neuropathy in her feet. Pt states she fell a couple of times, and went to the ED twice. Pt states she was suffering from Gout prior to that, stating she never had it before. Pt arrived with mother. First visit accompanied by someone. Pt states she has been tracking her protein, stating she gets about 40-52 grams per day, stating she is trying to hit the 60 gram mark. Pt states she is allergic to apples oranges peaches, shellfish, peanuts. Pt states she eats the [employer] 45 bread. Pt states she eats her protein first. Pt states she is not on any weight loss medication, stating she has never been on any.  Estimated daily fluid intake: 64 oz Supplements:  Current average weekly physical activity: ADLs; water aerobics and rehab once a week (planning for 8 weeks)  24-Hr Dietary Recall First Meal: skip or  oatmeal with fruit or eggs or grits or strawberries and cottage cheese and boiled egg Snack:  Second Meal: tuna protein crackers Snack: deli meat, cottage cheese Third Meal: tuna and steamed carrots Snack: pop cycle (outshine zero sugar) Beverages: water with ice, plain water or water with crystal light, un-sweet hot tea  Estimated Energy Needs Calories: 1500  NUTRITION DIAGNOSIS  Overweight/obesity (-3.3) related to past poor dietary habits and physical inactivity as evidenced by patient w/ planned RYGB surgery following dietary guidelines for continued weight loss.  NUTRITION INTERVENTION  Nutrition counseling (C-1) and education (E-2) to facilitate bariatric surgery goals.  Pre-Op Goals Reviewed with the Patient Encouraged patient to honor their body's internal hunger and fullness cues.  Throughout the day, check in mentally and rate hunger. Stop eating when satisfied not full regardless of how much food is left on the plate.  Get more if still hungry 20-30 minutes later.  The key is to honor satisfaction so throughout the meal, rate fullness factor and stop when comfortably satisfied not physically full. The key is to honor hunger and fullness without any feelings of guilt or shame.  Pay attention to what the internal cues are, rather than any external factors. This will enhance the confidence you have in listening to your own body and following those internal cues enabling you to increase how often you eat when you are hungry not out of appetite and stop when you are satisfied not full.  Encouraged pt to continue to eat balanced meals inclusive of non starchy vegetables 2 times a day 7 days a week Encouraged pt to continue to drink a minium 64 fluid ounces with half being plain water to satisfy proper hydration  Why you need complex  carbohydrates: Whole grains and other complex carbohydrates are required to have a healthy diet. Whole grains provide fiber which can help with blood glucose  levels and help keep you satiated. Fruits and starchy vegetables provide essential vitamins and minerals required for immune function, eyesight support, brain support, bone density, wound healing and many other functions within the body. According to the current evidenced based 2020-2025 Dietary Guidelines for Americans, complex carbohydrates are part of a healthy eating pattern which is associated with a decreased risk for type 2 diabetes, cancers, and cardiovascular disease.   Pre-Op Goals Progress & New Goals Continue: to avoid skipping meals; eat every 3-5 hours. Continue: avoiding simple carbohydrates; find complex carbohydrate options like whole grains, whole fruits, unprocessed starchy vegetables. Continue: increase non-starchy vegetables, aim for 2 or more servings per day. Continue: Practice not drinking with your meals. Continue: small frequent scheduled meals and snacks. Continue: walk 15-30 minutes 3-4 times per week. Continue: track protein; aim 60 grams per day; include protein with every meal and snack New: Find a protein shake you like, look for an unflavored protein powder.  Handouts Provided Include    Learning Style & Readiness for Change Teaching method utilized: Visual & Auditory  Demonstrated degree of understanding via: Teach Back  Readiness Level: preparation Barriers to learning/adherence to lifestyle change: mobility  RD's Notes for Next Visit Patient progress toward chosen goals   MONITORING & EVALUATION Dietary intake, weekly physical activity, body weight, and pre-op goals in 1 month.   Next Steps  Patient is to return to [facility] in 1 month for next SWL visit.

## 2023-07-06 ENCOUNTER — Other Ambulatory Visit: Payer: Self-pay | Admitting: Physician Assistant

## 2023-07-06 MED ORDER — HYDROCODONE-ACETAMINOPHEN 5-325 MG PO TABS: 1.0000 | ORAL_TABLET | ORAL | 0 refills | Status: AC | PRN

## 2023-07-07 ENCOUNTER — Observation Stay (HOSPITAL_COMMUNITY)
Admission: EM | Admit: 2023-07-07 | Discharge: 2023-07-09 | Disposition: A | Discharge status: Home / Self Care | Attending: Family Medicine | Admitting: Family Medicine

## 2023-07-07 ENCOUNTER — Telehealth: Payer: Self-pay

## 2023-07-07 ENCOUNTER — Other Ambulatory Visit: Payer: Self-pay

## 2023-07-07 ENCOUNTER — Emergency Department (HOSPITAL_COMMUNITY)

## 2023-07-07 DIAGNOSIS — R7401 Elevation of levels of liver transaminase levels: Secondary | ICD-10-CM | POA: Diagnosis not present

## 2023-07-07 DIAGNOSIS — R202 Paresthesia of skin: Secondary | ICD-10-CM | POA: Diagnosis not present

## 2023-07-07 DIAGNOSIS — Z79899 Other long term (current) drug therapy: Secondary | ICD-10-CM | POA: Diagnosis not present

## 2023-07-07 DIAGNOSIS — E876 Hypokalemia: Secondary | ICD-10-CM | POA: Diagnosis not present

## 2023-07-07 DIAGNOSIS — E860 Dehydration: Secondary | ICD-10-CM | POA: Diagnosis not present

## 2023-07-07 DIAGNOSIS — E538 Deficiency of other specified B group vitamins: Secondary | ICD-10-CM

## 2023-07-07 DIAGNOSIS — D519 Vitamin B12 deficiency anemia, unspecified: Secondary | ICD-10-CM | POA: Diagnosis not present

## 2023-07-07 DIAGNOSIS — I959 Hypotension, unspecified: Secondary | ICD-10-CM | POA: Diagnosis not present

## 2023-07-07 DIAGNOSIS — D61818 Other pancytopenia: Secondary | ICD-10-CM | POA: Insufficient documentation

## 2023-07-07 DIAGNOSIS — M609 Myositis, unspecified: Secondary | ICD-10-CM

## 2023-07-07 DIAGNOSIS — N179 Acute kidney failure, unspecified: Principal | ICD-10-CM

## 2023-07-07 DIAGNOSIS — R748 Abnormal levels of other serum enzymes: Secondary | ICD-10-CM | POA: Insufficient documentation

## 2023-07-07 DIAGNOSIS — R531 Weakness: Secondary | ICD-10-CM

## 2023-07-07 DIAGNOSIS — R0602 Shortness of breath: Secondary | ICD-10-CM | POA: Diagnosis not present

## 2023-07-07 LAB — I-STAT VENOUS BLOOD GAS, ED
Acid-base deficit: 7 mmol/L — ABNORMAL HIGH (ref 0.0–2.0)
Acid-base deficit: 9 mmol/L — ABNORMAL HIGH (ref 0.0–2.0)
Bicarbonate: 15.1 mmol/L — ABNORMAL LOW (ref 20.0–28.0)
Bicarbonate: 17 mmol/L — ABNORMAL LOW (ref 20.0–28.0)
Calcium, Ion: 1.06 mmol/L — ABNORMAL LOW (ref 1.15–1.40)
Calcium, Ion: 1.17 mmol/L (ref 1.15–1.40)
HCT: 37 % (ref 36.0–46.0)
HCT: 39 % (ref 36.0–46.0)
Hemoglobin: 12.6 g/dL (ref 12.0–15.0)
Hemoglobin: 13.3 g/dL (ref 12.0–15.0)
O2 Saturation: 49 %
O2 Saturation: 72 %
Potassium: 3.6 mmol/L (ref 3.5–5.1)
Potassium: 4 mmol/L (ref 3.5–5.1)
Sodium: 134 mmol/L — ABNORMAL LOW (ref 135–145)
Sodium: 135 mmol/L (ref 135–145)
TCO2: 16 mmol/L — ABNORMAL LOW (ref 22–32)
TCO2: 18 mmol/L — ABNORMAL LOW (ref 22–32)
pCO2, Ven: 28.4 mmHg — ABNORMAL LOW (ref 44–60)
pCO2, Ven: 28.4 mmHg — ABNORMAL LOW (ref 44–60)
pH, Ven: 7.333 (ref 7.25–7.43)
pH, Ven: 7.385 (ref 7.25–7.43)
pO2, Ven: 26 mmHg — CL (ref 32–45)
pO2, Ven: 40 mmHg (ref 32–45)

## 2023-07-07 LAB — I-STAT CHEM 8, ED
BUN: 31 mg/dL — ABNORMAL HIGH (ref 6–20)
Calcium, Ion: 1.14 mmol/L — ABNORMAL LOW (ref 1.15–1.40)
Chloride: 104 mmol/L (ref 98–111)
Creatinine, Ser: 2.1 mg/dL — ABNORMAL HIGH (ref 0.44–1.00)
Glucose, Bld: 100 mg/dL — ABNORMAL HIGH (ref 70–99)
HCT: 38 % (ref 36.0–46.0)
Hemoglobin: 12.9 g/dL (ref 12.0–15.0)
Potassium: 3.6 mmol/L (ref 3.5–5.1)
Sodium: 135 mmol/L (ref 135–145)
TCO2: 19 mmol/L — ABNORMAL LOW (ref 22–32)

## 2023-07-07 LAB — AMMONIA: Ammonia: 36 umol/L — ABNORMAL HIGH (ref 9–35)

## 2023-07-07 LAB — I-STAT CG4 LACTIC ACID, ED: Lactic Acid, Venous: 4.2 mmol/L (ref 0.5–1.9)

## 2023-07-07 MED ORDER — LACTATED RINGERS IV BOLUS
1000.0000 mL | Freq: Once | INTRAVENOUS | Status: AC
  Administered 2023-07-07: 1000 mL via INTRAVENOUS

## 2023-07-07 MED ORDER — NALOXONE HCL 2 MG/2ML IJ SOSY
2.0000 mg | PREFILLED_SYRINGE | Freq: Once | INTRAMUSCULAR | Status: AC
  Administered 2023-07-07: 2 mg via INTRAVENOUS
  Filled 2023-07-07: qty 2

## 2023-07-07 MED ORDER — NALOXONE HCL 4 MG/0.1ML NA LIQD
1.0000 | Freq: Once | NASAL | Status: AC
  Administered 2023-07-07: 1 via NASAL

## 2023-07-07 NOTE — ED Triage Notes (Signed)
 Patient to ED for generalized weakness and low BP. Patient states hx of same.

## 2023-07-07 NOTE — ED Provider Notes (Signed)
 [facility] Provider Note   CSN: [id number] Arrival date & time: 07/07/23  2147     History  Chief Complaint  Patient presents with   Weakness    Tiffany Velasquez is a 28 y.o. female.  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  She was dropped off at the front door moaning and sleepy, pulled from the car and brought back to the resuscitation room. BP initially 70s systolic. She is sleepy but answering questions appropriately-reports she is here today because she has been really sleepy, weak and blood pressure low. Reports it has been as low as 60s today.  She last took her blood pressure medicine last night and normally takes it at night. She did take her pain medication hydrocodone and gabapentin today. Denies taking extra. Denies any other medication use, drug use, SI. Denies fever, chills, vomiting, abdominal pain, chest pain, black or bloody stools. Has occasional dyspnea more like fatigue, not acute dyspnea today. No cough, congestion, urinary symptoms. Has burning in her feet which has been ongoing for months and today was so weak and had pain that family was having to lift her onto the toilet.  She had 2 episodes of diarrhea today, and has not been eating or drinking well.   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 Authorizing Provider  acetaZOLAMIDE (DIAMOX) 250 MG tablet Take 250 mg by mouth 2 (two) times daily. 01/17/23  Yes [provider]  Adalimumab 40 MG/0.4ML PNKT Inject 40 mg into the skin every 14 (fourteen) days. 03/03/20  Yes [provider]  albuterol (PROVENTIL) (2.5 MG/3ML) 0.083% nebulizer solution Take 3 mLs (2.5 mg total) by nebulization every 6 (six) hours as needed for wheezing or shortness of breath. 04/21/23  Yes Rochester Chuck, MD  albuterol (VENTOLIN HFA) 108 (90 Base) MCG/ACT inhaler Inhale 2 puffs every 4-6 hours as needed for cough, wheeze, tightness in chest, or shortness of breath 11/07/22  Yes Ambs, Jeanmarie Millet, FNP  allopurinol (ZYLOPRIM) 100 MG tablet Take 1 tablet (100 mg total) by mouth daily. 05/29/23  Yes Zheng, Jacky, MD  azelastine (ASTELIN) 0.1 % nasal spray USE 2 SPRAYS IN EACH NOSTRIL TWICE DAILY AS NEEDED FOR RUNNY NOSE/DRAINAGE DOWN THROAT 11/23/22  Yes Rochester Chuck, MD  bimatoprost (LUMIGAN) 0.01 % SOLN Place 1 drop into both eyes at bedtime. 11/01/22  Yes [provider]  brimonidine (ALPHAGAN) 0.2 % ophthalmic solution Place 1 drop into both eyes 3 (three) times daily.   Yes [provider]  budesonide-formoterol (SYMBICORT) 160-4.5 MCG/ACT inhaler Inhale 2 puffs into the lungs in the morning and at bedtime. 11/23/22  Yes Rochester Chuck, MD  clindamycin (CLEOCIN T) 1 % lotion Apply topically 2 (two) times daily. 08/17/15  Yes [provider]  colchicine 0.6 MG tablet Take 1 tablet (0.6 mg total) by mouth daily. 05/29/23  Yes Albin Huh, MD  dexlansoprazole (DEXILANT) 60 MG capsule Take 1 capsule (60 mg  total) by mouth daily. 05/25/23  Yes Santina Cull R, PA-C  dorzolamide-timolol (COSOPT) 2-0.5 % ophthalmic solution Place 1 drop into both eyes 2 (two) times daily.   Yes [provider]  famotidine (PEPCID) 40 MG tablet Take 1 tablet (40 mg total) by mouth at bedtime. 05/25/23  Yes Santina Cull R, PA-C  fluticasone (FLONASE) 50 MCG/ACT nasal spray Place 1 spray into both nostrils daily. 11/07/22  Yes Ambs, Jeanmarie Millet, FNP   gabapentin (NEURONTIN) 300 MG capsule Start at 300 p.o. 3 times daily.  If tolerated work up to 600 mg p.o. 3 times daily Patient taking differently: Take 600 mg by mouth 3 (three) times daily. 06/22/23  Yes Persons, Norma Beckers, [place]  GAMUNEX-C 20 GM/200ML SOLN  05/20/22  Yes [provider]  HYDROcodone-acetaminophen (NORCO/VICODIN) 5-325 MG tablet Take 1 tablet by mouth every 4 (four) hours as needed for up to 5 days. 07/06/23 07/11/23 Yes Persons, Norma Beckers, PA  loratadine (CLARITIN) 10 MG tablet Take 1 tablet (10 mg total) by mouth daily. 03/08/23  Yes Ernestina Headland, MD  metoCLOPramide (REGLAN) 10 MG tablet Take 1 tablet (10 mg total) by mouth every 8 (eight) hours as needed for nausea. 04/17/23  Yes Iva Mariner, MD  mometasone (ELOCON) 0.1 % cream Apply 1 Application topically 2 (two) times daily. 11/23/22  Yes Rochester Chuck, MD  montelukast (SINGULAIR) 10 MG tablet Take 1 tablet (10 mg total) by mouth at bedtime. 11/23/22  Yes Rochester Chuck, MD  mycophenolate (CELLCEPT) 500 MG tablet Take 500 mg by mouth 2 (two) times daily.   Yes [provider]  Olmesartan-amLODIPine-HCTZ 20-5-12.5 MG TABS TAKE 1 TABLET BY MOUTH DAILY 06/26/23  Yes Sowell, Ace Holder, MD  ondansetron (ZOFRAN) 8 MG tablet Take 1 tablet (8 mg total) by mouth every 8 (eight) hours as needed for nausea or vomiting. 05/25/23  Yes Santina Cull R, PA-C  polyethylene glycol powder (GLYCOLAX/MIRALAX) 17 GM/SCOOP powder Take 17 g by mouth daily. Patient taking differently: Take 17 g by mouth daily as needed for mild constipation. 05/10/23  Yes Sowell, Ace Holder, MD  RETIN-A MICRO 0.04 % gel Apply 1 application  topically daily. 06/28/21  Yes [provider]  sucralfate (CARAFATE) 1 GM/10ML suspension Take 10 mLs (1 g total) by mouth 4 (four) times daily -  with meals and at bedtime. 05/25/23  Yes Santina Cull R, PA-C  RHOPRESSA 0.02 % SOLN Place 1 drop into the right eye daily.    [provider]   sirolimus (RAPAMUNE) 2 MG tablet Take by mouth. Patient not taking: Reported on 07/08/2023 05/13/21   [provider]      Allergies    Other, Peanut-containing drug products, Shellfish allergy, Apple juice, Orange fruit [citrus], Peach flavoring agent (non-screening), Tomato, and Tree extract    Review of Systems   Review of Systems  Physical Exam Updated Vital Signs BP 121/66 (BP Location: Right Arm)   Pulse (!) 104   Temp (!) 97.5 F (36.4 C) (Oral)   Resp 16   Ht 5\' 9"  (1.753 m)   Wt (!) 147.4 kg   SpO2 99%   BMI 47.99 kg/m  Physical Exam Vitals and nursing note reviewed.  Constitutional:      General: She is sleeping.     Appearance: She is well-developed. She is ill-appearing. She is not diaphoretic.  HENT:     Head: Normocephalic and atraumatic.  Eyes:     Conjunctiva/sclera: Conjunctivae normal.  Cardiovascular:  Rate and Rhythm: Normal rate and regular rhythm.     Heart sounds: Normal heart sounds. No murmur heard.    No friction rub. No gallop.  Pulmonary:     Effort: Pulmonary effort is normal. No respiratory distress.     Breath sounds: Normal breath sounds. No wheezing or rales.  Abdominal:     General: There is no distension.     Palpations: Abdomen is soft.     Tenderness: There is no abdominal tenderness. There is no guarding.  Musculoskeletal:        General: No tenderness.     Cervical back: Normal range of motion.  Skin:    General: Skin is warm and dry.     Findings: No erythema or rash.  Neurological:     Mental Status: She is oriented to person, place, and time and easily aroused.     Sensory: No sensory deficit.     Motor: No weakness.     Comments: No focal numbness or weakness, generally weak Sleepy, if not stimulated will fall asleep but with stimulation wakes and answers questions      ED Results / Procedures / Treatments   Labs (all labs ordered are listed, but only abnormal results are displayed) Labs Reviewed   AMMONIA - Abnormal; Notable for the following components:      Result Value   Ammonia 36 (*)    All other components within normal limits  URINALYSIS, W/ REFLEX TO CULTURE (INFECTION SUSPECTED) - Abnormal; Notable for the following components:   APPearance HAZY (*)    Specific Gravity, Urine 1.004 (*)    Hgb urine dipstick MODERATE (*)    Bacteria, UA MANY (*)    All other components within normal limits  RAPID URINE DRUG SCREEN, HOSP PERFORMED - Abnormal; Notable for the following components:   Opiates POSITIVE (*)    All other components within normal limits  CBC WITH DIFFERENTIAL/PLATELET - Abnormal; Notable for the following components:   WBC 3.8 (*)    RBC 3.85 (*)    Hemoglobin 11.4 (*)    HCT 35.8 (*)    RDW 24.3 (*)    nRBC 3 (*)    All other components within normal limits  CK - Abnormal; Notable for the following components:   Total CK 787 (*)    All other components within normal limits  BASIC METABOLIC PANEL WITH GFR - Abnormal; Notable for the following components:   CO2 17 (*)    BUN 26 (*)    Creatinine, Ser 1.36 (*)    GFR, Estimated 55 (*)    All other components within normal limits  CBC - Abnormal; Notable for the following components:   WBC 3.0 (*)    RBC 3.66 (*)    Hemoglobin 10.6 (*)    HCT 33.6 (*)    RDW 24.5 (*)    Platelets 123 (*)    All other components within normal limits  I-STAT CHEM 8, ED - Abnormal; Notable for the following components:   BUN 31 (*)    Creatinine, Ser 2.10 (*)    Glucose, Bld 100 (*)    Calcium, Ion 1.14 (*)    TCO2 19 (*)    All other components within normal limits  I-STAT VENOUS BLOOD GAS, ED - Abnormal; Notable for the following components:   pCO2, Ven 28.4 (*)    Bicarbonate 15.1 (*)    TCO2 16 (*)    Acid-base deficit 9.0 (*)  All other components within normal limits  I-STAT CG4 LACTIC ACID, ED - Abnormal; Notable for the following components:   Lactic Acid, Venous 4.2 (*)    All other components within  normal limits  I-STAT VENOUS BLOOD GAS, ED - Abnormal; Notable for the following components:   pCO2, Ven 28.4 (*)    pO2, Ven 26 (*)    Bicarbonate 17.0 (*)    TCO2 18 (*)    Acid-base deficit 7.0 (*)    Sodium 134 (*)    Calcium, Ion 1.06 (*)    All other components within normal limits  I-STAT CG4 LACTIC ACID, ED - Abnormal; Notable for the following components:   Lactic Acid, Venous 3.5 (*)    All other components within normal limits  TROPONIN I (HIGH SENSITIVITY) - Abnormal; Notable for the following components:   Troponin I (High Sensitivity) 37 (*)    All other components within normal limits  HCG, QUANTITATIVE, PREGNANCY  LACTIC ACID, PLASMA  TSH  CBC WITH DIFFERENTIAL/PLATELET  RPR  HIV ANTIBODY (ROUTINE TESTING W REFLEX)  ANA  VITAMIN B12  HEMOGLOBIN A1C  RHEUMATOID FACTOR  CYCLIC CITRUL PEPTIDE ANTIBODY, IGG/IGA  CBG MONITORING, ED    EKG EKG Interpretation Date/Time:  Friday July 07 2023 21:53:26 EDT Ventricular Rate:  107 PR Interval:  140 QRS Duration:  112 QT Interval:  344 QTC Calculation: 459 R Axis:   13  Text Interpretation: Sinus tachycardia Incomplete right bundle branch block Low voltage, precordial leads No significant change since last tracing Confirmed by Scarlette Currier ([id number]) on 07/08/2023 9:35:56 AM  Radiology DG Chest Portable 1 View Result Date: 07/07/2023 CLINICAL DATA:  Shortness of breath EXAM: PORTABLE CHEST 1 VIEW COMPARISON:  04/19/2023 FINDINGS: Right Port-A-Cath remains in place, unchanged. Heart and mediastinal contours are within normal limits. No focal opacities or effusions. No acute bony abnormality. IMPRESSION: No active cardiopulmonary disease. Electronically Signed   By: Janeece Mechanic M.D.   On: 07/07/2023 23:13    Procedures .Critical Care  Performed by: Scarlette Currier, MD Authorized by: Scarlette Currier, MD   Critical care provider statement:    Critical care time (minutes):  30   Critical care was time spent  personally by me on the following activities:  Development of treatment plan with patient or surrogate, evaluation of patient's response to treatment, examination of patient, ordering and review of laboratory studies, ordering and performing treatments and interventions, pulse oximetry, re-evaluation of patient's condition and review of old charts     Medications Ordered in ED Medications  allopurinol (ZYLOPRIM) tablet 100 mg (100 mg Oral Given 07/08/23 0845)  colchicine tablet 0.6 mg (0.6 mg Oral Given 07/08/23 0845)  acetaZOLAMIDE (DIAMOX) tablet 250 mg (250 mg Oral Given 07/08/23 0520)  famotidine (PEPCID) tablet 40 mg (has no administration in time range)  pantoprazole (PROTONIX) EC tablet 40 mg (40 mg Oral Given 07/08/23 0845)  mometasone-formoterol (DULERA) 200-5 MCG/ACT inhaler 2 puff (2 puffs Inhalation Given 07/08/23 0850)  latanoprost (XALATAN) 0.005 % ophthalmic solution 1 drop (has no administration in time range)  Netarsudil Dimesylate 0.02 % SOLN 1 drop (has no administration in time range)  brimonidine (ALPHAGAN) 0.2 % ophthalmic solution 1 drop (1 drop Both Eyes Given 07/08/23 0851)  dorzolamide-timolol (COSOPT) 2-0.5 % ophthalmic solution 1 drop (1 drop Both Eyes Given 07/08/23 0850)  gabapentin (NEURONTIN) capsule 300 mg (300 mg Oral Given 07/08/23 0845)  enoxaparin (LOVENOX) injection 70 mg (70 mg Subcutaneous Given 07/08/23 0845)  0.9 %  sodium  chloride infusion ( Intravenous New Bag/Given 07/08/23 0322)  acetaminophen (TYLENOL) tablet 1,000 mg (has no administration in time range)  lactated ringers bolus 1,000 mL (0 mLs Intravenous Stopped 07/07/23 2316)  naloxone (NARCAN) injection 2 mg (2 mg Intravenous Given 07/07/23 2258)  naloxone (NARCAN) nasal spray 4 mg/0.1 mL (1 spray Nasal Provided for home use 07/07/23 2215)  lactated ringers bolus 1,000 mL (0 mLs Intravenous Stopped 07/08/23 0003)    ED Course/ Medical Decision Making/ A&P                                   28yo  female with history of hypertension, morbid obesity, asthma, 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  Reports her blood pressures were in 60s at home, arrives somnolent with blood pressure in 70s on initial check, improved to 90s on recheck.  DDx for hypotension includes anemia/GI bleed or other hemorrhage, dehydration, PE, tamponade, sepsis, arrhythmia, electrolyte abnormality, polypharmacy in setting of AKI, other overdose, thyroid abnormality.  Given IN narcan initially without much affect prior to port accessed.   EKG completed and evaluated by me shows sinus rhythm, no acute ST changes.   Ordered IV fluids, narcan IV.  Istat labs show AKI, elevated lactic acid. At this time feel AKI contributing to accumulation of medications such as gabapentin leading to somnolence, hypotension. At this time she is not having infectious symptoms to suggest sepsis but will continue to follow other labs, symptoms, work up to determine need for IV abx. Not having chest pain to suggest PE or ACS however will follow labs.  Dehydration may be contributing to symptoms by history.  SIgned out with labs, reevaluation pending.        Final Clinical Impression(s) / ED Diagnoses Final diagnoses:  AKI (acute kidney injury) (HCC)  Dehydration  Generalized weakness    Rx / DC Orders ED Discharge Orders     None         Scarlette Currier, MD 07/08/23 [phone]

## 2023-07-07 NOTE — Telephone Encounter (Signed)
 Patients mother calls nurse line reporting worsening neuropathy symptoms.   She reports the tingling and burning sensation  that started in her feet, have now traveled "up her legs." She reports this started on Wednesday.  She reports she has been taking Gabapentin and her pain medication as prescribed. She reports trouble standing for long periods of time.   She denies any redness or swelling. No shortness of breath or chest pains. No fevers.  She has an apt with Neurology on 4/14.  Mother advised to monitor symptoms closely over the weekend with Neurology apt coming up.   Discussed precautions and strict ED precautions in the meantime.   Mother was appreciative of time.

## 2023-07-07 NOTE — ED Provider Notes (Signed)
 Blood pressure 102/89, temperature 97.8 F (36.6 C), temperature source Oral, resp. rate (!) 21, height 5\' 9"  (1.753 m), weight (!) 147.4 kg, SpO2 100%.  Assuming care from Dr. Tamela Fake.  In short, Tiffany Velasquez is a 28 y.o. female with a chief complaint of Weakness .  Refer to the original H&P for additional details.  The current plan of care is to follow up after labs.  11:25 PM  Discussed case with Mom at bedside. Patient with bilateral burning foot pain and feeling like her legs are "stuck in cement." She is requiring significant help at home including being lifted off the toilet. Has had some diarrhea today. Arrived by private vehicle with hypotension and somnolence. More awake now.   Labs show AKI. Plan for admit.   Discussed patient's case with Family Med service to request admission. Patient and family (if present) updated with plan.  I reviewed all nursing notes, vitals, pertinent old records, EKGs, labs, imaging (as available).     Roberts Ching, MD 07/09/23 [phone]

## 2023-07-08 ENCOUNTER — Observation Stay (HOSPITAL_COMMUNITY)

## 2023-07-08 ENCOUNTER — Encounter (HOSPITAL_COMMUNITY): Payer: Self-pay | Admitting: Family Medicine

## 2023-07-08 DIAGNOSIS — R29898 Other symptoms and signs involving the musculoskeletal system: Secondary | ICD-10-CM | POA: Diagnosis not present

## 2023-07-08 DIAGNOSIS — N179 Acute kidney failure, unspecified: Secondary | ICD-10-CM

## 2023-07-08 DIAGNOSIS — M2548 Effusion, other site: Secondary | ICD-10-CM | POA: Diagnosis not present

## 2023-07-08 DIAGNOSIS — R531 Weakness: Secondary | ICD-10-CM

## 2023-07-08 DIAGNOSIS — R202 Paresthesia of skin: Secondary | ICD-10-CM | POA: Diagnosis not present

## 2023-07-08 DIAGNOSIS — E860 Dehydration: Secondary | ICD-10-CM

## 2023-07-08 DIAGNOSIS — M51379 Other intervertebral disc degeneration, lumbosacral region without mention of lumbar back pain or lower extremity pain: Secondary | ICD-10-CM | POA: Diagnosis not present

## 2023-07-08 DIAGNOSIS — M609 Myositis, unspecified: Secondary | ICD-10-CM

## 2023-07-08 DIAGNOSIS — I959 Hypotension, unspecified: Secondary | ICD-10-CM | POA: Insufficient documentation

## 2023-07-08 HISTORY — DX: Acute kidney failure, unspecified: N17.9

## 2023-07-08 LAB — CBC WITH DIFFERENTIAL/PLATELET
Abs Immature Granulocytes: 0 10*3/uL (ref 0.00–0.07)
Basophils Absolute: 0 10*3/uL (ref 0.0–0.1)
Basophils Relative: 0 %
Eosinophils Absolute: 0 10*3/uL (ref 0.0–0.5)
Eosinophils Relative: 0 %
HCT: 35.8 % — ABNORMAL LOW (ref 36.0–46.0)
Hemoglobin: 11.4 g/dL — ABNORMAL LOW (ref 12.0–15.0)
Lymphocytes Relative: 22 %
Lymphs Abs: 0.8 10*3/uL (ref 0.7–4.0)
MCH: 29.6 pg (ref 26.0–34.0)
MCHC: 31.8 g/dL (ref 30.0–36.0)
MCV: 93 fL (ref 80.0–100.0)
Monocytes Absolute: 0.4 10*3/uL (ref 0.1–1.0)
Monocytes Relative: 11 %
Neutro Abs: 2.5 10*3/uL (ref 1.7–7.7)
Neutrophils Relative %: 67 %
Platelets: 155 10*3/uL (ref 150–400)
RBC: 3.85 MIL/uL — ABNORMAL LOW (ref 3.87–5.11)
RDW: 24.3 % — ABNORMAL HIGH (ref 11.5–15.5)
WBC: 3.8 10*3/uL — ABNORMAL LOW (ref 4.0–10.5)
nRBC: 0 % (ref 0.0–0.2)
nRBC: 3 /100{WBCs} — ABNORMAL HIGH

## 2023-07-08 LAB — RAPID URINE DRUG SCREEN, HOSP PERFORMED
Amphetamines: NOT DETECTED
Barbiturates: NOT DETECTED
Benzodiazepines: NOT DETECTED
Cocaine: NOT DETECTED
Opiates: POSITIVE — AB
Tetrahydrocannabinol: NOT DETECTED

## 2023-07-08 LAB — I-STAT CG4 LACTIC ACID, ED: Lactic Acid, Venous: 3.5 mmol/L (ref 0.5–1.9)

## 2023-07-08 LAB — URINALYSIS, W/ REFLEX TO CULTURE (INFECTION SUSPECTED)
Bilirubin Urine: NEGATIVE
Glucose, UA: NEGATIVE mg/dL
Ketones, ur: NEGATIVE mg/dL
Leukocytes,Ua: NEGATIVE
Nitrite: NEGATIVE
Protein, ur: NEGATIVE mg/dL
Specific Gravity, Urine: 1.004 — ABNORMAL LOW (ref 1.005–1.030)
pH: 5 (ref 5.0–8.0)

## 2023-07-08 LAB — BASIC METABOLIC PANEL WITH GFR
Anion gap: 15 (ref 5–15)
BUN: 26 mg/dL — ABNORMAL HIGH (ref 6–20)
CO2: 17 mmol/L — ABNORMAL LOW (ref 22–32)
Calcium: 9.1 mg/dL (ref 8.9–10.3)
Chloride: 103 mmol/L (ref 98–111)
Creatinine, Ser: 1.36 mg/dL — ABNORMAL HIGH (ref 0.44–1.00)
GFR, Estimated: 55 mL/min — ABNORMAL LOW (ref >60)
Glucose, Bld: 85 mg/dL (ref 70–99)
Potassium: 3.6 mmol/L (ref 3.5–5.1)
Sodium: 135 mmol/L (ref 135–145)

## 2023-07-08 LAB — VITAMIN B12: Vitamin B-12: 194 pg/mL (ref 180–914)

## 2023-07-08 LAB — HCG, QUANTITATIVE, PREGNANCY: hCG, Beta Chain, Quant, S: <1 m[IU]/mL (ref <5)

## 2023-07-08 LAB — CBC
HCT: 33.6 % — ABNORMAL LOW (ref 36.0–46.0)
Hemoglobin: 10.6 g/dL — ABNORMAL LOW (ref 12.0–15.0)
MCH: 29 pg (ref 26.0–34.0)
MCHC: 31.5 g/dL (ref 30.0–36.0)
MCV: 91.8 fL (ref 80.0–100.0)
Platelets: 123 10*3/uL — ABNORMAL LOW (ref 150–400)
RBC: 3.66 MIL/uL — ABNORMAL LOW (ref 3.87–5.11)
RDW: 24.5 % — ABNORMAL HIGH (ref 11.5–15.5)
WBC: 3 10*3/uL — ABNORMAL LOW (ref 4.0–10.5)
nRBC: 0 % (ref 0.0–0.2)

## 2023-07-08 LAB — CK: Total CK: 787 U/L — ABNORMAL HIGH (ref 38–234)

## 2023-07-08 LAB — HEMOGLOBIN A1C
Hgb A1c MFr Bld: 4.6 % — ABNORMAL LOW (ref 4.8–5.6)
Mean Plasma Glucose: 85.32 mg/dL

## 2023-07-08 LAB — HIV ANTIBODY (ROUTINE TESTING W REFLEX): HIV Screen 4th Generation wRfx: NONREACTIVE

## 2023-07-08 LAB — LACTIC ACID, PLASMA: Lactic Acid, Venous: 1.5 mmol/L (ref 0.5–1.9)

## 2023-07-08 LAB — TSH: TSH: 0.662 u[IU]/mL (ref 0.350–4.500)

## 2023-07-08 LAB — TROPONIN I (HIGH SENSITIVITY): Troponin I (High Sensitivity): 37 ng/L — ABNORMAL HIGH (ref <18)

## 2023-07-08 MED ORDER — OXYCODONE HCL 5 MG PO TABS
5.0000 mg | ORAL_TABLET | Freq: Once | ORAL | Status: AC
  Administered 2023-07-08: 5 mg via ORAL
  Filled 2023-07-08: qty 1

## 2023-07-08 MED ORDER — FAMOTIDINE 20 MG PO TABS
40.0000 mg | ORAL_TABLET | Freq: Every day | ORAL | Status: DC
  Administered 2023-07-08: 40 mg via ORAL
  Filled 2023-07-08: qty 2

## 2023-07-08 MED ORDER — ACETAZOLAMIDE 250 MG PO TABS
250.0000 mg | ORAL_TABLET | Freq: Two times a day (BID) | ORAL | Status: DC
  Administered 2023-07-08 – 2023-07-09 (×3): 250 mg via ORAL
  Filled 2023-07-08 (×4): qty 1

## 2023-07-08 MED ORDER — NETARSUDIL DIMESYLATE 0.02 % OP SOLN
1.0000 [drp] | Freq: Every day | OPHTHALMIC | Status: DC
  Administered 2023-07-08 – 2023-07-09 (×2): 1 [drp] via OPHTHALMIC

## 2023-07-08 MED ORDER — LATANOPROST 0.005 % OP SOLN
1.0000 [drp] | Freq: Every day | OPHTHALMIC | Status: DC
  Administered 2023-07-08: 1 [drp] via OPHTHALMIC
  Filled 2023-07-08: qty 2.5

## 2023-07-08 MED ORDER — SODIUM CHLORIDE 0.9 % IV SOLN: INTRAVENOUS | Status: AC

## 2023-07-08 MED ORDER — OXYCODONE HCL 5 MG PO TABS
5.0000 mg | ORAL_TABLET | Freq: Once | ORAL | Status: AC
  Administered 2023-07-09: 5 mg via ORAL
  Filled 2023-07-08: qty 1

## 2023-07-08 MED ORDER — ACETAMINOPHEN 500 MG PO TABS
1000.0000 mg | ORAL_TABLET | Freq: Four times a day (QID) | ORAL | Status: DC | PRN
  Administered 2023-07-08 – 2023-07-09 (×4): 1000 mg via ORAL
  Filled 2023-07-08 (×4): qty 2

## 2023-07-08 MED ORDER — GABAPENTIN 300 MG PO CAPS
600.0000 mg | ORAL_CAPSULE | Freq: Three times a day (TID) | ORAL | Status: DC
  Administered 2023-07-08 – 2023-07-09 (×3): 600 mg via ORAL
  Filled 2023-07-08 (×3): qty 2

## 2023-07-08 MED ORDER — BRIMONIDINE TARTRATE 0.2 % OP SOLN
1.0000 [drp] | Freq: Three times a day (TID) | OPHTHALMIC | Status: DC
  Administered 2023-07-08 – 2023-07-09 (×4): 1 [drp] via OPHTHALMIC
  Filled 2023-07-08: qty 5

## 2023-07-08 MED ORDER — PANTOPRAZOLE SODIUM 40 MG PO TBEC
40.0000 mg | DELAYED_RELEASE_TABLET | Freq: Every day | ORAL | Status: DC
  Administered 2023-07-08 – 2023-07-09 (×2): 40 mg via ORAL
  Filled 2023-07-08 (×2): qty 1

## 2023-07-08 MED ORDER — GABAPENTIN 300 MG PO CAPS
300.0000 mg | ORAL_CAPSULE | Freq: Three times a day (TID) | ORAL | Status: DC
  Administered 2023-07-08 (×2): 300 mg via ORAL
  Filled 2023-07-08 (×2): qty 1

## 2023-07-08 MED ORDER — ENOXAPARIN SODIUM 80 MG/0.8ML IJ SOSY
70.0000 mg | PREFILLED_SYRINGE | INTRAMUSCULAR | Status: DC
  Administered 2023-07-08 – 2023-07-09 (×2): 70 mg via SUBCUTANEOUS
  Filled 2023-07-08: qty 0.8
  Filled 2023-07-08: qty 0.7
  Filled 2023-07-08: qty 0.8

## 2023-07-08 MED ORDER — ALLOPURINOL 100 MG PO TABS
100.0000 mg | ORAL_TABLET | Freq: Every day | ORAL | Status: DC
  Administered 2023-07-08 – 2023-07-09 (×2): 100 mg via ORAL
  Filled 2023-07-08 (×2): qty 1

## 2023-07-08 MED ORDER — COLCHICINE 0.6 MG PO TABS
0.6000 mg | ORAL_TABLET | Freq: Every day | ORAL | Status: DC
  Administered 2023-07-08 – 2023-07-09 (×2): 0.6 mg via ORAL
  Filled 2023-07-08 (×2): qty 1

## 2023-07-08 MED ORDER — DORZOLAMIDE HCL-TIMOLOL MAL 2-0.5 % OP SOLN
1.0000 [drp] | Freq: Two times a day (BID) | OPHTHALMIC | Status: DC
  Administered 2023-07-08 – 2023-07-09 (×3): 1 [drp] via OPHTHALMIC
  Filled 2023-07-08: qty 10

## 2023-07-08 MED ORDER — ACETAMINOPHEN 325 MG PO TABS
650.0000 mg | ORAL_TABLET | Freq: Four times a day (QID) | ORAL | Status: DC | PRN
  Administered 2023-07-08: 650 mg via ORAL
  Filled 2023-07-08: qty 2

## 2023-07-08 MED ORDER — MOMETASONE FURO-FORMOTEROL FUM 200-5 MCG/ACT IN AERO
2.0000 | INHALATION_SPRAY | Freq: Two times a day (BID) | RESPIRATORY_TRACT | Status: DC
  Administered 2023-07-08 – 2023-07-09 (×3): 2 via RESPIRATORY_TRACT
  Filled 2023-07-08: qty 8.8

## 2023-07-08 NOTE — H&P (Signed)
 Hospital Admission History and Physical Service Pager: [phone]  Patient name: Tiffany Velasquez Medical record number: [id number] Date of Birth: 1996-01-27 Age: 28 y.o. Gender: female  Primary Care Provider: Wilhemena Harbour, MD Consultants: None Code Status: Full code Preferred Emergency Contact:  Contact Information     Name Relation Home Work Mobile   Mclennan,Cynthia Mother [phone]        Other Contacts   None on File      Chief Complaint: Weakness  Assessment and Plan: Tiffany Velasquez is a 28 y.o. female presenting with generalized weakness and diarrhea.  PMHx includes asthma, glaucoma, panuveitis, HTN, anemia and peripheral neuropathy  Differential for presentation of this includes: Dehydration: Most likely given ongoing diarrhea suggestive of viral gastroenteritis in the same timeframe as weakness, lightheadedness and near syncope and subsequent improvement in hypotension with IV hydration. Substance induced: Taking Norco at home for severe peripheral neuropathy and UDS positive for opiates although patient denies illicit use.  Given Narcan in the ED without significant improvement in symptoms. GBS: Unlikely given chronic peripheral neuropathy without other neurologic deficit upon presentation. Thyroid disorder: Unlikely given normal TSH in 04/2023.  Assessment & Plan AKI (acute kidney injury) (HCC) Cr 2.1 from previously normal baseline, likely in the setting of insensible losses from diarrhea and reduced p.o. intake. -Admitted to FMTS, Dr. Grandville Lax attending -MIVF -Trend BMP -CK -Strict I&O Generalized weakness Acutely worsened most likely secondary from dehydration due to viral gastroenteritis.  Patient does have chronic worsening mobility and weakness in the setting of severe peripheral neuropathy with unclear etiology.  Does have appointment with neurology on 4/14 for further evaluation. -MIVF as above -PT/OT consulted -Decrease gabapentin to 300 mg 3 times  daily to prevent oversedation -Hold home mycophenolate in the setting of acute illness -Fall precautions -Pain management: Acetaminophen 650 mg every 6 hours as needed, consider adding on additional agent if persistent pain Hypotension Now resolved with IV rehydration. 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 female presenting with generalized weakness, lightheadedness and feeling like she is going to pass out x 1 day.  Reports she has had persistent diarrhea during that time as well associated with some abdominal cramping.  Denies N/V, fevers, cough, congestion, chest pain and shortness of breath.  Reports she has been trying to still drink around 64 ounces of water daily.  States she is eating less though.  Reports symptoms worsened over the past day to the point where family decided to bring her to the ED because she was becoming less responsive.  Ongoing severe peripheral neuropathy for the past couple of months, unclear etiology.  States she did increase to gabapentin 600 mg 3 times daily.  Takes Norco 5-325 every 4 hours for severe pain, last took it the morning of 4/11.  Denies any other concurrent medication or substance use and reports mother handles medications.  In the ED, initially lethargic with some hypotension at subsequently improved with IV rehydration.  Found to have AKI with CR 2.1 from normal baseline.  Review Of Systems: Per HPI  Pertinent Past Medical  History: Asthma Eczema Glaucoma HTN Panuveitis of both eyes Peripheral neuropathy Remainder reviewed in history tab.   Pertinent Past Surgical History: Cataract extraction  Port-A-Cath insertion Remainder reviewed in history tab.   Pertinent Social History: Tobacco use: Never Alcohol use: None Other Substance use: Denies Lives with mother, brothers and uncle  Pertinent Family History: None pertinent Remainder reviewed in history tab.   Important Outpatient Medications: Acetazolamide 250 mg twice daily Allopurinol 100 mg daily Bimatoprost eyedrops nightly line  Brimonidine eyedrops 3 times daily Symbicort 2 puffs twice daily Colchicine 0.6 mg daily Dexlansoprazole 60 mg daily Cosopt eyedrops twice daily Famotidine 40 mg daily Gabapentin 600 mg 3 times daily Olmesartan-amlodipine-HCTZ 20-5-12.5 daily Rhopressa eyedrops in right eye daily Hydrocodone-acetaminophen 5-3 25 every 4 hours as needed Mycophenolate 500 mg twice daily Remainder reviewed in medication history.   Objective: BP 108/69   Temp 98 F (36.7 C) (Oral)   Resp 15   Ht 5\' 9"  (1.753 m)   Wt (!) 147.4 kg   SpO2 100%   BMI 47.99 kg/m  Exam: General: Lethargic appearing but easily arousable to voice.  NAD. Eyes: PERRLA, anicteric sclera ENTM: Dry oral mucosa and lips. Neck: Supple, non-tender Cardiovascular: RRR without murmur Respiratory: Normal WOB on RA.  Difficult to auscultate breath sounds due to body habitus Gastrointestinal: Soft, non-tender, non-distended MSK: No peripheral edema Derm: Warm, dry, no rashes noted Neuro: CN intact.  5/5 grip strength bilaterally.  4/5 hip flexion bilaterally.  Loss of sensation at mid shin bilaterally.  Sensation intact in upper extremities. Psych: Cooperative, pleasant   Labs:  CBC BMET  Recent Labs  Lab 07/07/23 2322 07/07/23 2327  WBC 3.8*  --   HGB 11.4* 12.6  HCT 35.8* 37.0  PLT 155  --    Recent Labs  Lab 07/07/23 2243 07/07/23 2253  07/07/23 2327  NA 135   < > 134*  K 3.6   < > 4.0  CL 104  --   --   BUN 31*  --   --   CREATININE 2.10*  --   --   GLUCOSE 100*  --   --    < > = values in this interval not displayed.    Pertinent additional labs: VBG: 7.3 11/22/24/17 Ionized calcium: 1.06 Troponin: 37 Lactate: 4.2 > 3.5 UA: Moderate hemoglobin UDS: + opiates  EKG: Sinus tachycardia   Imaging Studies Performed: DG Chest Portable 1 View Result Date: 07/07/2023 IMPRESSION: No active cardiopulmonary disease.      Jonne Netters, MD 07/08/2023, 3:14 AM PGY-2, [facility]  FPTS Intern pager: [phone], text pages welcome Secure chat group [facility] Teaching Service

## 2023-07-08 NOTE — Plan of Care (Signed)

## 2023-07-08 NOTE — Assessment & Plan Note (Signed)
 Now resolved with IV rehydration. -Hold home olmesartan-amlodipine-HCTZ

## 2023-07-08 NOTE — Assessment & Plan Note (Signed)
>>  ASSESSMENT AND PLAN FOR GENERALIZED WEAKNESS  MYOSITIS WRITTEN ON 07/08/2023  3:50 AM BY HERNANDEZ, KATIE, MD  Acutely worsened most likely secondary from dehydration due to viral gastroenteritis.  Patient does have chronic worsening mobility and weakness in the setting of severe peripheral neuropathy with unclear etiology.  Does have appointment with neurology on 4/14 for further evaluation. -MIVF as above -PT/OT consulted -Decrease gabapentin  to 300 mg 3 times daily to prevent oversedation -Hold home mycophenolate in the setting of acute illness -Fall precautions -Pain management: Acetaminophen  650 mg every 6 hours as needed, consider adding on additional agent if persistent pain

## 2023-07-08 NOTE — Assessment & Plan Note (Signed)
 Acutely worsened most likely secondary from dehydration due to viral gastroenteritis.  Patient does have chronic worsening mobility and weakness in the setting of severe peripheral neuropathy with unclear etiology.  Does have appointment with neurology on 4/14 for further evaluation. -MIVF as above -PT/OT consulted -Decrease gabapentin to 300 mg 3 times daily to prevent oversedation -Hold home mycophenolate in the setting of acute illness -Fall precautions -Pain management: Acetaminophen 650 mg every 6 hours as needed, consider adding on additional agent if persistent pain

## 2023-07-08 NOTE — Plan of Care (Signed)
  Problem: Pain Managment: Goal: General experience of comfort will improve and/or be controlled Outcome: Progressing   Problem: Safety: Goal: Ability to remain free from injury will improve Outcome: Progressing

## 2023-07-08 NOTE — Care Management Obs Status (Signed)
 MEDICARE OBSERVATION STATUS NOTIFICATION   Patient Details  Name: Tiffany Velasquez MRN: [id number] Date of Birth: [birth year]   Medicare Observation Status Notification Given:  Yes    Jannine Meo, RN 07/08/2023, 1:45 PM

## 2023-07-08 NOTE — Hospital Course (Addendum)
 Tiffany Velasquez is a 28 y.o.female with a history of asthma, hypertension, obesity and glaucoma followed by ophthalmology and treated with mycophenolate who was admitted to the family medicine teaching Service at [facility] for AKI. Her hospital course is detailed below:  Prerenal AKI Patient presented with creatinine to 2.1, generalized weakness, lightheadedness.  Had diarrhea with viral gastroenteritis, likely etiology.  Received significant IV fluid intake/boluses, creatinine resolved to 0.75 by day of discharge.  Paresthesias with back pain and lower leg weakness Unknown etiology, but possibly related to mycophenolate.  Has needed significant help at home.  Noted lower back pain -- MRI showed minimal disc bulging at L4-S1 without stenosis.  Ordered risk certification labs including A1c, TSH, B12, HIV, RPR, ANA, RF, CCP and C met.  OT recommended home health OT. PT deferred recs until after this neurologist appointment. Patient has very close outpatient neurology visit 4/14. Will discharge on home with gabapentin 600 mg 3 times daily.  Pancytopenia Noted on initial exam, asymptomatic.  Afebrile, no other infectious symptomatology noted.   Hypokalemia To 2.8 after significant IV fluid repletion.  Gave 60 mEq x1, improved to 3.1 later in the day. Discharged on 20 mEq x3 days.   B12 deficiency To <200.  Started oral B12 500 units daily for 28 days.  Possibly related to mycophenolate.  Other chronic conditions were medically managed with home medications and formulary alternatives as necessary (panuveitis, retinal edema, gout, GERD, asthma)  PCP Follow-up Recommendations: Follow up potassium Follow-up risk stratification labs for paresthesias: ANA, rheumatoid factor, CCPA, RPR Follow asymptomatic pancytopenia Will need to follow up B12 level in a month Consider restarting home olmesartan-amlodipine-hydrochlorothiazide, stopped due to normal pressures without medication inpatient

## 2023-07-08 NOTE — Plan of Care (Signed)
 Called to speak with patient's mother per request. Pt's mother confirmed patient's name and birthday. Mother shares concerns regarding patient's pain. Discussed increasing gabapentin, continuing tylenol prn, and adding one-time dose of oxycodone. Encouraged for patient to ask for Tylenol as needed. Encouraged for patient to let team know if pain worsening again. Explained that the lumbar MRI read has not been completed yet, will provide updates as they become available. Questions were addressed and pt mother expressed understanding throughout conversation.

## 2023-07-08 NOTE — Evaluation (Signed)
 Physical Therapy Evaluation Patient Details Name: Tiffany Velasquez MRN: [id number] DOB: [birth year] Today's Date: 07/08/2023  History of Present Illness  Pt is 28 y.o. female presenting 07/07/23 with generalized weakness, lightheadedness, and diarrhea. Admitted with AKI. PMH asthma, glaucoma, panuveitis, HTN, anemia and peripheral neuropathy  Clinical Impression  Pt is a 28 y.o. female presenting on 07/07/23 with above conditions and deficits below, see PT Problem List. PTA pt lived in an one level home with her family who provided her assistance for iADLs and mobility. Her home has 4 STE and she uses a RW for longer distance ambulation. Currently she is mod I for bed mobility, min A for transfers from an elevated surface, and min A-CGA for ambulation with a RW. Throughout the session, the pt reported consistent intense N/T pain in bil LE that is limiting her ability to walk and perform functional tasks. Pt displays deficits in LE strength & ROM, transfers, gait, dynamic balance, and endurance and would benefit from continued acute PT. While the pt would benefit from a PT follow-up, a better recommendation can be made once the pt completes her outpatient neurologist appt soon. Will continue to follow acutely.           If plan is discharge home, recommend the following: A little help with walking and/or transfers;A little help with bathing/dressing/bathroom;Assistance with cooking/housework;Assist for transportation;Help with stairs or ramp for entrance   Can travel by private vehicle        Equipment Recommendations None recommended by PT  Recommendations for Other Services       Functional Status Assessment Patient has had a recent decline in their functional status and demonstrates the ability to make significant improvements in function in a reasonable and predictable amount of time.     Precautions / Restrictions Precautions Precautions: Fall Recall of Precautions/Restrictions:  Intact Restrictions Weight Bearing Restrictions Per Provider Order: No      Mobility  Bed Mobility Overal bed mobility: Needs Assistance Bed Mobility: Supine to Sit     Supine to sit: Modified independent (Device/Increase time)     General bed mobility comments: elevated HOB    Transfers Overall transfer level: Needs assistance Equipment used: Rolling walker (2 wheels) Transfers: Sit to/from Stand Sit to Stand: Min assist           General transfer comment: pt required min A for power up and elevation of bed.    Ambulation/Gait Ambulation/Gait assistance: Contact guard assist, Min assist Gait Distance (Feet): 80 Feet Assistive device: Rolling walker (2 wheels) Gait Pattern/deviations: Step-through pattern, Decreased stride length Gait velocity: reduced Gait velocity interpretation: 1.31 - 2.62 ft/sec, indicative of limited community ambulator   General Gait Details: pt required min A to maintain stability during a LOB as she turned around the bed and reported intense pain in her LEs. Pt's LOB occured to her R. No other LOB noted. Pt required several standing rest breaks s/t bil LE pain  Stairs            Wheelchair Mobility     Tilt Bed    Modified Rankin (Stroke Patients Only)       Balance Overall balance assessment: Needs assistance Sitting-balance support: No upper extremity supported, Feet supported Sitting balance-Leahy Scale: Good Sitting balance - Comments: pt sits EOB without LOB or lean   Standing balance support: Bilateral upper extremity supported, Reliant on assistive device for balance Standing balance-Leahy Scale: Poor Standing balance comment: pt reliant on RW for standing balance. Stands with  increased out-toeing                             Pertinent Vitals/Pain Pain Assessment Pain Assessment: Faces Faces Pain Scale: Hurts whole lot Pain Location: bil LEs and bottom of feet Pain Descriptors / Indicators: Shooting,  Sharp, Radiating, Pins and needles Pain Intervention(s): Monitored during session    Home Living Family/patient expects to be discharged to:: Private residence Living Arrangements: Parent;Other relatives (2 brothers, mother, and uncle) Available Help at Discharge: Family;Available 24 hours/day Type of Home: House Home Access: Stairs to enter Entrance Stairs-Rails: Right (brothers help her on the other side) Entrance Stairs-Number of Steps: 4   Home Layout: One level Home Equipment: Agricultural consultant (2 wheels);BSC/3in1      Prior Function Prior Level of Function : Needs assist       Physical Assist : ADLs (physical);Mobility (physical) Mobility (physical): Gait;Stairs ADLs (physical): IADLs Mobility Comments: uses RW for longer distances in the house, otherwise furniture/wall walks, family also help her prn ADLs Comments: family helps her in and out of tub to shower seat and helps her get washed up, she dresses herself, A for toileting     Extremity/Trunk Assessment   Upper Extremity Assessment Upper Extremity Assessment: Defer to OT evaluation    Lower Extremity Assessment Lower Extremity Assessment: RLE deficits/detail;LLE deficits/detail RLE Deficits / Details: Grossly 3+/5 MMT RLE Sensation: history of peripheral neuropathy RLE Coordination: decreased gross motor LLE Deficits / Details: Grossly 4/5 MMT LLE Sensation: history of peripheral neuropathy LLE Coordination: decreased gross motor    Cervical / Trunk Assessment Cervical / Trunk Assessment: Kyphotic  Communication   Communication Communication: No apparent difficulties    Cognition Arousal: Alert Behavior During Therapy: WFL for tasks assessed/performed (with periods of lability)   PT - Cognitive impairments: No apparent impairments                       PT - Cognition Comments: Pt had periods of liability as she would stop walking and would report being afraid she was going to fall. Following  commands: Intact       Cueing Cueing Techniques: Verbal cues     General Comments      Exercises     Assessment/Plan    PT Assessment Patient needs continued PT services  PT Problem List Decreased strength;Decreased range of motion;Decreased activity tolerance;Decreased balance;Decreased mobility;Decreased coordination;Decreased knowledge of use of DME;Decreased safety awareness;Cardiopulmonary status limiting activity;Impaired sensation;Pain;Obesity       PT Treatment Interventions DME instruction;Stair training;Gait training;Functional mobility training;Therapeutic activities;Therapeutic exercise;Balance training;Neuromuscular re-education;Patient/family education;Wheelchair mobility training;Modalities    PT Goals (Current goals can be found in the Care Plan section)  Acute Rehab PT Goals Patient Stated Goal: decrease LE discomfort PT Goal Formulation: With patient Time For Goal Achievement: 07/22/23 Potential to Achieve Goals: Fair    Frequency Min 1X/week     Co-evaluation               AM-PAC PT "6 Clicks" Mobility  Outcome Measure Help needed turning from your back to your side while in a flat bed without using bedrails?: None Help needed moving from lying on your back to sitting on the side of a flat bed without using bedrails?: None Help needed moving to and from a bed to a chair (including a wheelchair)?: A Little Help needed standing up from a chair using your arms (e.g., wheelchair or bedside chair)?: A  Little Help needed to walk in hospital room?: A Little Help needed climbing 3-5 steps with a railing? : A Lot 6 Click Score: 19    End of Session Equipment Utilized During Treatment: Gait belt Activity Tolerance: Patient limited by pain;Patient limited by fatigue Patient left: in bed;with call bell/phone within reach;with bed alarm set   PT Visit Diagnosis: Unsteadiness on feet (R26.81);Other abnormalities of gait and mobility (R26.89);Repeated falls  (R29.6);Muscle weakness (generalized) (M62.81);History of falling (Z91.81);Difficulty in walking, not elsewhere classified (R26.2);Other symptoms and signs involving the nervous system (R29.898);Pain Pain - Right/Left: Right (bil) Pain - part of body: Leg;Ankle and joints of foot    Time: 7829-5621 PT Time Calculation (min) (ACUTE ONLY): 22 min   Charges:   PT Evaluation $PT Eval Moderate Complexity: 1 Mod   PT General Charges $$ ACUTE PT VISIT: 1 Visit         [address], SPT   [place] 07/08/2023, 4:21 PM

## 2023-07-08 NOTE — Evaluation (Signed)
 Occupational Therapy Evaluation Patient Details Name: Tiffany Velasquez MRN: [id number] DOB: July 25, 1995 Today's Date: 07/08/2023   History of Present Illness   Pt is 28 y.o. female presenting with generalized weakness, lightheadedness, and diarrhea. Admitted with AKI. PMH asthma, glaucoma, panuveitis, HTN, anemia and peripheral neuropathy     Clinical Impressions This 28 yo female admitted with above presents to acute OT with PLOF of 3 recent falls getting off the toilet, needing A for tub transfers, bathing, and toileting but can dress herself. Uses RW, furniture/walls, and A from family for ambulation. She currently is setup-Mod A for basic ADLs. She will continue to benefit from acute OT with follow HHOT as well as the current 24 hour S/prn A she states she has.     If plan is discharge home, recommend the following:   A little help with walking and/or transfers;A lot of help with bathing/dressing/bathroom;Assistance with cooking/housework;Assist for transportation;Help with stairs or ramp for entrance;Direct supervision/assist for medications management;Direct supervision/assist for financial management     Functional Status Assessment   Patient has had a recent decline in their functional status and demonstrates the ability to make significant improvements in function in a reasonable and predictable amount of time.     Equipment Recommendations   None recommended by OT      Precautions/Restrictions   Precautions Precautions: Fall Restrictions Weight Bearing Restrictions Per Provider Order: No     Mobility Bed Mobility Overal bed mobility: Needs Assistance Bed Mobility: Supine to Sit     Supine to sit: Modified independent (Device/Increase time)     General bed mobility comments: HOB up    Transfers Overall transfer level: Needs assistance Equipment used: Rolling walker (2 wheels) Transfers: Sit to/from Stand, Bed to chair/wheelchair/BSC Sit to Stand: Min  assist           General transfer comment: 4 feet with RW min A      Balance Overall balance assessment: Needs assistance Sitting-balance support: No upper extremity supported, Feet supported Sitting balance-Leahy Scale: Good     Standing balance support: Bilateral upper extremity supported, Reliant on assistive device for balance Standing balance-Leahy Scale: Poor                             ADL either performed or assessed with clinical judgement   ADL Overall ADL's : Needs assistance/impaired Eating/Feeding: Independent;Sitting   Grooming: Set up;Sitting   Upper Body Bathing: Set up;Sitting Upper Body Bathing Details (indicate cue type and reason): A for back only Lower Body Bathing: Minimal assistance Lower Body Bathing Details (indicate cue type and reason): min A sit<>stand Upper Body Dressing : Set up;Sitting   Lower Body Dressing: Minimal assistance Lower Body Dressing Details (indicate cue type and reason): min A sit<>stand Toilet Transfer: Minimal assistance;Ambulation;Rolling walker (2 wheels) Toilet Transfer Details (indicate cue type and reason): simulated bed>recliner 4 feet away Toileting- Clothing Manipulation and Hygiene: Moderate assistance Toileting - Clothing Manipulation Details (indicate cue type and reason): min A sit<>stand             Vision Patient Visual Report: No change from baseline              Pertinent Vitals/Pain Pain Assessment Pain Assessment: 0-10 Pain Score: 6  Pain Location: LLE (knee to foot) Pain Descriptors / Indicators: Shooting, Sharp, Radiating, Pins and needles Pain Intervention(s): Limited activity within patient's tolerance, Monitored during session, Repositioned     Extremity/Trunk Assessment Upper  Extremity Assessment Upper Extremity Assessment: Overall WFL for tasks assessed           Communication Communication Communication: No apparent difficulties   Cognition Arousal:  Alert Behavior During Therapy: WFL for tasks assessed/performed                                 Following commands: Intact       Cueing    Cueing Techniques: Verbal cues              Home Living Family/patient expects to be discharged to:: Private residence Living Arrangements: Parent;Other relatives (2 brothers, mother, and uncle) Available Help at Discharge: Family;Available 24 hours/day Type of Home: House Home Access: Stairs to enter [employer] of Steps: 4 Entrance Stairs-Rails: Right (brothers help her on the other side) Home Layout: One level     Bathroom Shower/Tub: Tub/shower unit;Curtain   Firefighter: Standard     Home Equipment: Agricultural consultant (2 wheels);BSC/3in1          Prior Functioning/Environment Prior Level of Function : Needs assist             Mobility Comments: uses RW for longer distances in the house, otherwise furniture/wall walks, family also help her prn ADLs Comments: family helps her in and out of tub to shower seat and helps her get washed up, she dresses herself, A for toileting    OT Problem List: Impaired balance (sitting and/or standing);Decreased safety awareness;Pain;Obesity   OT Treatment/Interventions: Self-care/ADL training;DME and/or AE instruction;Balance training;Patient/family education      OT Goals(Current goals can be found in the care plan section)   Acute Rehab OT Goals Patient Stated Goal: to not fall again OT Goal Formulation: With patient Time For Goal Achievement: 07/22/23 Potential to Achieve Goals: Good   OT Frequency:  Min 2X/week       AM-PAC OT "6 Clicks" Daily Activity     Outcome Measure Help from another person eating meals?: None Help from another person taking care of personal grooming?: A Little Help from another person toileting, which includes using toliet, bedpan, or urinal?: A Lot Help from another person bathing (including washing, rinsing,  drying)?: A Lot Help from another person to put on and taking off regular upper body clothing?: A Little Help from another person to put on and taking off regular lower body clothing?: A Lot 6 Click Score: 16   End of Session Equipment Utilized During Treatment: Gait belt;Rolling walker (2 wheels) Nurse Communication: Mobility status (via secure chat)  Activity Tolerance: Patient tolerated treatment well Patient left: in chair;with call bell/phone within reach;with chair alarm set  OT Visit Diagnosis: Unsteadiness on feet (R26.81);Other abnormalities of gait and mobility (R26.89);Muscle weakness (generalized) (M62.81);Pain Pain - Right/Left: Left Pain - part of body:  (lower leg)                Time: 1610-9604 OT Time Calculation (min): 19 min Charges:  OT General Charges $OT Visit: 1 Visit OT Evaluation $OT Eval Moderate Complexity: 1 Mod OT Treatments $Self Care/Home Management : 8-22 mins  Tiffany Velasquez OT Acute Rehabilitation Services Office [phone]    Tiffany Velasquez 07/08/2023, 12:50 PM

## 2023-07-08 NOTE — Assessment & Plan Note (Signed)
 Cr 2.1 from previously normal baseline, likely in the setting of insensible losses from diarrhea and reduced p.o. intake. -Admitted to FMTS, Dr. Grandville Lax attending -MIVF -Trend BMP -CK -Strict I&O

## 2023-07-09 ENCOUNTER — Encounter (HOSPITAL_COMMUNITY): Payer: Self-pay | Admitting: Family Medicine

## 2023-07-09 DIAGNOSIS — R202 Paresthesia of skin: Secondary | ICD-10-CM | POA: Diagnosis not present

## 2023-07-09 DIAGNOSIS — N179 Acute kidney failure, unspecified: Secondary | ICD-10-CM | POA: Diagnosis not present

## 2023-07-09 DIAGNOSIS — R748 Abnormal levels of other serum enzymes: Secondary | ICD-10-CM | POA: Insufficient documentation

## 2023-07-09 DIAGNOSIS — E876 Hypokalemia: Secondary | ICD-10-CM | POA: Insufficient documentation

## 2023-07-09 DIAGNOSIS — E860 Dehydration: Secondary | ICD-10-CM | POA: Diagnosis not present

## 2023-07-09 LAB — COMPREHENSIVE METABOLIC PANEL WITH GFR
ALT: 37 U/L (ref 0–44)
AST: 115 U/L — ABNORMAL HIGH (ref 15–41)
Albumin: 3.1 g/dL — ABNORMAL LOW (ref 3.5–5.0)
Alkaline Phosphatase: 29 U/L — ABNORMAL LOW (ref 38–126)
Anion gap: 12 (ref 5–15)
BUN: 16 mg/dL (ref 6–20)
CO2: 19 mmol/L — ABNORMAL LOW (ref 22–32)
Calcium: 9.1 mg/dL (ref 8.9–10.3)
Chloride: 104 mmol/L (ref 98–111)
Creatinine, Ser: 0.75 mg/dL (ref 0.44–1.00)
GFR, Estimated: >60 mL/min (ref >60)
Glucose, Bld: 89 mg/dL (ref 70–99)
Potassium: 2.8 mmol/L — ABNORMAL LOW (ref 3.5–5.1)
Sodium: 135 mmol/L (ref 135–145)
Total Bilirubin: 0.7 mg/dL (ref 0.0–1.2)
Total Protein: 7.5 g/dL (ref 6.5–8.1)

## 2023-07-09 LAB — BASIC METABOLIC PANEL WITH GFR
Anion gap: 10 (ref 5–15)
BUN: 14 mg/dL (ref 6–20)
CO2: 22 mmol/L (ref 22–32)
Calcium: 9.6 mg/dL (ref 8.9–10.3)
Chloride: 104 mmol/L (ref 98–111)
Creatinine, Ser: 0.75 mg/dL (ref 0.44–1.00)
GFR, Estimated: >60 mL/min (ref >60)
Glucose, Bld: 101 mg/dL — ABNORMAL HIGH (ref 70–99)
Potassium: 3.1 mmol/L — ABNORMAL LOW (ref 3.5–5.1)
Sodium: 136 mmol/L (ref 135–145)

## 2023-07-09 LAB — RPR: RPR Ser Ql: NONREACTIVE

## 2023-07-09 MED ORDER — POTASSIUM CHLORIDE 20 MEQ PO PACK
20.0000 meq | PACK | Freq: Every day | ORAL | Status: DC
Start: 2023-07-10 — End: 2023-07-09

## 2023-07-09 MED ORDER — VITAMIN B-12 1000 MCG PO TABS: 1000.0000 ug | ORAL_TABLET | Freq: Every day | ORAL | Status: DC

## 2023-07-09 MED ORDER — VITAMIN B-12 100 MCG PO TABS: 500.0000 ug | ORAL_TABLET | Freq: Every day | ORAL | Status: DC

## 2023-07-09 MED ORDER — HEPARIN SOD (PORK) LOCK FLUSH 100 UNIT/ML IV SOLN
500.0000 [IU] | INTRAVENOUS | Status: AC | PRN
  Administered 2023-07-09: 500 [IU]

## 2023-07-09 MED ORDER — VITAMIN B-12 1000 MCG PO TABS
2000.0000 ug | ORAL_TABLET | Freq: Every day | ORAL | Status: DC
  Administered 2023-07-09: 2000 ug via ORAL
  Filled 2023-07-09: qty 2

## 2023-07-09 MED ORDER — ACETAMINOPHEN 500 MG PO TABS: 1000.0000 mg | ORAL_TABLET | Freq: Four times a day (QID) | ORAL | Status: DC | PRN

## 2023-07-09 MED ORDER — POTASSIUM CHLORIDE CRYS ER 20 MEQ PO TBCR
60.0000 meq | EXTENDED_RELEASE_TABLET | Freq: Once | ORAL | Status: AC
  Administered 2023-07-09: 60 meq via ORAL
  Filled 2023-07-09: qty 3

## 2023-07-09 MED ORDER — CYANOCOBALAMIN 500 MCG PO TABS: 500.0000 ug | ORAL_TABLET | Freq: Every day | ORAL | 0 refills | Status: DC

## 2023-07-09 MED ORDER — POTASSIUM CHLORIDE 20 MEQ PO PACK: 20.0000 meq | PACK | Freq: Every day | ORAL | 0 refills | Status: DC

## 2023-07-09 NOTE — Assessment & Plan Note (Deleted)
 AST 115, increased from 68 after last check 3 weeks ago.  ALT and alk phos WNL or low. - A.m. CMP as above

## 2023-07-09 NOTE — Plan of Care (Signed)
 Problem: Education: Goal: Knowledge of General Education information will improve Description: Including pain rating scale, medication(s)/side effects and non-pharmacologic comfort measures 07/09/2023 1609 by Andreana Klingerman K, RN Outcome: Adequate for Discharge 07/09/2023 1608 by Milca Sytsma K, RN Outcome: Adequate for Discharge 07/09/2023 1608 by Eleonore Grill, RN Outcome: Progressing 07/09/2023 1608 by Malessa Zartman K, RN Outcome: Adequate for Discharge   Problem: Health Behavior/Discharge Planning: Goal: Ability to manage health-related needs will improve 07/09/2023 1609 by Memory Heinrichs K, RN Outcome: Adequate for Discharge 07/09/2023 1608 by Tristian Bouska K, RN Outcome: Adequate for Discharge 07/09/2023 1608 by Eleonore Grill, RN Outcome: Progressing 07/09/2023 1608 by Eleonore Grill, RN Outcome: Adequate for Discharge   Problem: Clinical Measurements: Goal: Ability to maintain clinical measurements within normal limits will improve 07/09/2023 1609 by Habeeb Puertas K, RN Outcome: Adequate for Discharge 07/09/2023 1608 by Rasheida Broden K, RN Outcome: Adequate for Discharge 07/09/2023 1608 by Eleonore Grill, RN Outcome: Progressing 07/09/2023 1608 by Tifani Dack K, RN Outcome: Adequate for Discharge Goal: Will remain free from infection 07/09/2023 1609 by Gracia Saggese K, RN Outcome: Adequate for Discharge 07/09/2023 1608 by Eleonore Grill, RN Outcome: Adequate for Discharge 07/09/2023 1608 by Eleonore Grill, RN Outcome: Progressing 07/09/2023 1608 by Chayse Zatarain K, RN Outcome: Adequate for Discharge Goal: Diagnostic test results will improve 07/09/2023 1609 by Farouk Vivero K, RN Outcome: Adequate for Discharge 07/09/2023 1608 by Eleonore Grill, RN Outcome: Adequate for Discharge 07/09/2023 1608 by Eleonore Grill, RN Outcome: Progressing 07/09/2023 1608 by Jani Moronta K, RN Outcome: Adequate for Discharge Goal: Respiratory complications will  improve 07/09/2023 1609 by Julen Rubert K, RN Outcome: Adequate for Discharge 07/09/2023 1608 by Kamyia Thomason K, RN Outcome: Adequate for Discharge 07/09/2023 1608 by Eleonore Grill, RN Outcome: Progressing 07/09/2023 1608 by Nick Stults K, RN Outcome: Adequate for Discharge Goal: Cardiovascular complication will be avoided 07/09/2023 1609 by Magaly Pollina K, RN Outcome: Adequate for Discharge 07/09/2023 1608 by Arionna Hoggard K, RN Outcome: Adequate for Discharge 07/09/2023 1608 by Areanna Gengler K, RN Outcome: Progressing 07/09/2023 1608 by Manvir Thorson K, RN Outcome: Adequate for Discharge   Problem: Activity: Goal: Risk for activity intolerance will decrease 07/09/2023 1609 by Leala Bryand K, RN Outcome: Adequate for Discharge 07/09/2023 1608 by Eleonore Grill, RN Outcome: Adequate for Discharge 07/09/2023 1608 by Eleonore Grill, RN Outcome: Progressing 07/09/2023 1608 by Dorthula Bier K, RN Outcome: Adequate for Discharge   Problem: Nutrition: Goal: Adequate nutrition will be maintained 07/09/2023 1609 by Alysha Doolan K, RN Outcome: Adequate for Discharge 07/09/2023 1608 by Jovonna Nickell K, RN Outcome: Adequate for Discharge 07/09/2023 1608 by Eleonore Grill, RN Outcome: Progressing 07/09/2023 1608 by Penny Frisbie K, RN Outcome: Adequate for Discharge   Problem: Coping: Goal: Level of anxiety will decrease 07/09/2023 1609 by Ukiah Trawick K, RN Outcome: Adequate for Discharge 07/09/2023 1608 by Marielys Trinidad K, RN Outcome: Adequate for Discharge 07/09/2023 1608 by Mercedez Boule K, RN Outcome: Progressing 07/09/2023 1608 by Gregroy Dombkowski K, RN Outcome: Adequate for Discharge   Problem: Elimination: Goal: Will not experience complications related to bowel motility 07/09/2023 1609 by Breuna Loveall K, RN Outcome: Adequate for Discharge 07/09/2023 1608 by Kirk Sampley K, RN Outcome: Adequate for Discharge 07/09/2023 1608 by Eleonore Grill,  RN Outcome: Progressing 07/09/2023 1608 by Sanjeev Main K, RN Outcome: Adequate for Discharge Goal: Will not experience complications related to urinary retention 07/09/2023 1609 by Draven Laine K, RN Outcome: Adequate  for Discharge 07/09/2023 1608 by Kentrel Clevenger K, RN Outcome: Adequate for Discharge 07/09/2023 1608 by Eleonore Grill, RN Outcome: Progressing 07/09/2023 1608 by Shiann Kam K, RN Outcome: Adequate for Discharge   Problem: Pain Managment: Goal: General experience of comfort will improve and/or be controlled 07/09/2023 1609 by Devri Kreher K, RN Outcome: Adequate for Discharge 07/09/2023 1608 by Gaylia Kassel K, RN Outcome: Adequate for Discharge 07/09/2023 1608 by Eleonore Grill, RN Outcome: Progressing 07/09/2023 1608 by Cia Garretson K, RN Outcome: Adequate for Discharge   Problem: Safety: Goal: Ability to remain free from injury will improve 07/09/2023 1609 by Aurelia Gras K, RN Outcome: Adequate for Discharge 07/09/2023 1608 by Franchon Ketterman K, RN Outcome: Adequate for Discharge 07/09/2023 1608 by Jaden Batchelder K, RN Outcome: Progressing 07/09/2023 1608 by Lachlan Pelto K, RN Outcome: Adequate for Discharge   Problem: Skin Integrity: Goal: Risk for impaired skin integrity will decrease 07/09/2023 1609 by Masayuki Sakai K, RN Outcome: Adequate for Discharge 07/09/2023 1608 by Moishy Laday K, RN Outcome: Adequate for Discharge 07/09/2023 1608 by Nadav Swindell K, RN Outcome: Progressing 07/09/2023 1608 by Clatie Kessen K, RN Outcome: Adequate for Discharge

## 2023-07-09 NOTE — Plan of Care (Signed)
 Patient has reported to nursing staff that she is confined to one room and needs DME bedside commode. Currently undergoing Bariatric Nutrition Education with eye towards bariatric surgery. I have signed an order for the above DME bedside commode.   Vicente Graham, MD PGY-1 Family Medicine Teaching Service

## 2023-07-09 NOTE — Assessment & Plan Note (Deleted)
 Now resolved with IV rehydration. -Hold home olmesartan-amlodipine-HCTZ

## 2023-07-09 NOTE — Assessment & Plan Note (Deleted)
 Creatinine improved significantly overnight 1.36 ? 0.75.  We will defer renal ultrasound at this time.  Continues to have on measured urine output, x2 overnight.   -Admitted to FMTS, Dr. Grandville Lax attending -MIVF -Trend BMP -CK -Strict I&O

## 2023-07-09 NOTE — Plan of Care (Signed)
 Reached out to Anders Ban, patient's mother, at her request:  Had a thorough conversation with patient's mother concerning her current hospital course.  She is extremely disappointed and distraught concerning the progression of patient's neuropathy and generalized weakness.  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    She requests that, should patient not be able to make her outpatient neurology appointment, she be seen by inpatient neurology.  Expressed frustration that they have not already been consulted for patient's bilateral lower extremity neuropathy.  Was reassured that her MRI lumbar spine did not show findings consistent with serious pathology, and that she could be safely seen with very close outpatient follow-up tomorrow 4/14 at 8:30 AM.  Also noted desire to transfer care to a different hospital should patient need to remain in the hospital overnight.

## 2023-07-09 NOTE — Plan of Care (Signed)

## 2023-07-09 NOTE — Assessment & Plan Note (Deleted)
 2.8 this a.m.  Likely in setting of significant volume repletion. - Status post 60 mEq - A.m. CMP

## 2023-07-09 NOTE — Discharge Instructions (Addendum)
 Dear Tiffany Velasquez,  Thank you for letting us  participate in your care. You were hospitalized for dehydration with kidney injury and diagnosed with AKI (acute kidney injury) (HCC). You were treated with intravenous fluids, and your kidney numbers improved significantly over 48 hours.  We also performed a workup of your lower back pain and leg weakness - and drew a number of lab results.  They showed a deficiency of vitamin B12.  Some of these lab results are still pending, and will need to be looked over by your outpatient primary care physician.  The MRI of your low back did not show anything more than a slight bulging disk.     POST-HOSPITAL & CARE INSTRUCTIONS Take all medications as prescribed Go to your follow up appointments (listed below)   DOCTOR'S APPOINTMENT   Future Appointments  Date Time Provider Department Center  07/10/2023  8:50 AM Reyna Cava K, DO [facility] None  08/01/2023 11:20 AM Lylia Sand, MD [facility] CPR  08/03/2023 10:00 AM Margorie Shelter, RD [facility] NDM  08/04/2023  3:40 PM Armbruster, Lendon Queen, MD [facility]  08/08/2023 11:15 AM Floyce Hutching, DPM [facility]  11/23/2023 11:50 AM [facility] ANNUAL WELLNESS VISIT [facility]     Take care and be well!  Family Medicine Teaching Service Inpatient Team [place]  [facility]  [address] [place], [place] [address] [phone]

## 2023-07-09 NOTE — Discharge Summary (Addendum)
 Family Medicine Teaching [facility] Discharge Summary  Patient name: Tiffany Velasquez Medical record number: [id number] Date of birth: Jul 04, 1995 Age: 28 y.o. Gender: female Date of Admission: 07/07/2023  Date of Discharge: 07/09/2023 started on B12 2000 units daily for 10 days.  Started Admitting Physician: Doreene Eland, MD  Primary Care Provider: Bess Kinds, MD Consultants: None  Indication for Hospitalization: AKI  Discharge Diagnoses/Problem List:  Principal Problem for Admission: AKI Other Problems addressed during stay:  Principal Problem:   AKI (acute kidney injury) (HCC) Active Problems:   Generalized weakness   Hypotension   Dehydration   Paresthesia   Hypokalemia   Elevated liver enzymes    Brief Hospital Course:  Tiffany Velasquez is a 28 y.o.female with a history of asthma, hypertension, obesity and glaucoma followed by ophthalmology and treated with mycophenolate who was admitted to the family medicine teaching Service at [facility] for AKI. Her hospital course is detailed below:  Prerenal AKI Patient presented with creatinine to 2.1, generalized weakness, lightheadedness.  Had diarrhea with viral gastroenteritis, likely etiology.  Received significant IV fluid intake/boluses, creatinine resolved to 0.75 by day of discharge.  Paresthesias with back pain and lower leg weakness Unknown etiology, but possibly related to mycophenolate.  Has needed significant help at home.  Noted lower back pain -- MRI showed minimal disc bulging at L4-S1 without stenosis.  Ordered risk certification labs including A1c, TSH, B12, HIV, RPR, ANA, RF, CCP and C met.  OT recommended home health OT. PT deferred recs until after this neurologist appointment. Patient has very close outpatient neurology visit 4/14. Will discharge on home with gabapentin 600 mg 3 times daily.  Pancytopenia Noted on initial exam, asymptomatic.  Afebrile, no other infectious symptomatology noted.    Hypokalemia To 2.8 after significant IV fluid repletion.  Gave 60 mEq x1, improved to 3.1 later in the day. Discharged on 20 mEq x3 days.   B12 deficiency To <200.  Started oral B12 500 units daily for 28 days.  Possibly related to mycophenolate.  Other chronic conditions were medically managed with home medications and formulary alternatives as necessary (panuveitis, retinal edema, gout, GERD, asthma)  PCP Follow-up Recommendations: Follow up potassium Follow-up risk stratification labs for paresthesias: ANA, rheumatoid factor, CCPA, RPR Follow asymptomatic pancytopenia Will need to follow up B12 level in a month Consider restarting home olmesartan-amlodipine-hydrochlorothiazide, stopped due to normal pressures without medication inpatient  Disposition: Home  Discharge Condition: Stable  Discharge Exam:  Vitals:   07/09/23 0822 07/09/23 0942  BP: 121/66   Pulse: (!) 102   Resp: 18   Temp: (!) 97.5 F (36.4 C)   SpO2: 100% 98%   Constitutional: Female, grossly alert and oriented, appropriate, lying in bed, no acute distress Cardiovascular: Regular rate and rhythm, no murmurs rubs or gallops Respiratory: Clear to auscultation in anterior lung fields Abdominal: Bowel sounds present, no tenderness in 4 quadrants MSK: Range of motion grossly intact Neuro: Grossly intact, EOMI, pupils equally round Psych: Appropriate mood and affect, somewhat tearful  Significant Procedures: None  Significant Labs and Imaging:  Recent Labs  Lab 07/07/23 2322 07/07/23 2327 07/08/23 0401  WBC 3.8*  --  3.0*  HGB 11.4* 12.6 10.6*  HCT 35.8* 37.0 33.6*  PLT 155  --  123*   Recent Labs  Lab 07/07/23 2243 07/07/23 2253 07/07/23 2327 07/08/23 0401 07/09/23 0203 07/09/23 1431  NA 135 135 134* 135 135 136  K 3.6 3.6 4.0 3.6 2.8* 3.1*  CL 104  --   --  103 104 104  CO2  --   --   --  17* 19* 22  GLUCOSE 100*  --   --  85 89 101*  BUN 31*  --   --  26* 16 14  CREATININE 2.10*  --    --  1.36* 0.75 0.75  CALCIUM  --   --   --  9.1 9.1 9.6  ALKPHOS  --   --   --   --  29*  --   AST  --   --   --   --  115*  --   ALT  --   --   --   --  37  --   ALBUMIN  --   --   --   --  3.1*  --    CXR (4/11): IMPRESSION: No active cardiopulmonary disease.  MR LR spine without contrast (4/12) IMPRESSION: Minimal disc bulging at L5-S1. No stenosis.  Results/Tests Pending at Time of Discharge: CCPA, rheumatoid factor, ANA, RPR  Discharge Medications:  Allergies as of 07/09/2023       Reactions   Other Itching, Rash, Swelling   Seafood,tomato paste, peanut butter, peaches, oranges, apples Throat swelling  Dust mite, oak trees, grass- causes rash, itching   Peanut-containing Drug Products Anaphylaxis   Shellfish Allergy Anaphylaxis   Throat swelling   Apple Juice Rash   Orange Fruit [citrus] Rash   Peach Flavoring Agent (non-screening) Rash   Tomato Rash, Hives, Itching   Tree Extract Rash        Medication List     PAUSE taking these medications    Olmesartan-amLODIPine-HCTZ 20-5-12.5 MG Tabs Wait to take this until your doctor or other care provider tells you to start again. TAKE 1 TABLET BY MOUTH DAILY       STOP taking these medications    Heparin Na (Pork) Lock Flsh PF 100 UNIT/ML Soln   sirolimus 2 MG tablet Commonly known as: RAPAMUNE   sodium chloride 0.9 % infusion       TAKE these medications    acetaminophen 500 MG tablet Commonly known as: TYLENOL Take 2 tablets (1,000 mg total) by mouth every 6 (six) hours as needed for mild pain (pain score 1-3) or moderate pain (pain score 4-6).   acetaZOLAMIDE 250 MG tablet Commonly known as: DIAMOX Take 250 mg by mouth 2 (two) times daily.   adalimumab 40 MG/0.4ML pen Commonly known as: HUMIRA Inject 40 mg into the skin every 14 (fourteen) days.   albuterol 108 (90 Base) MCG/ACT inhaler Commonly known as: VENTOLIN HFA Inhale 2 puffs every 4-6 hours as needed for cough, wheeze, tightness in  chest, or shortness of breath   albuterol (2.5 MG/3ML) 0.083% nebulizer solution Commonly known as: PROVENTIL Take 3 mLs (2.5 mg total) by nebulization every 6 (six) hours as needed for wheezing or shortness of breath.   allopurinol 100 MG tablet Commonly known as: ZYLOPRIM Take 1 tablet (100 mg total) by mouth daily.   azelastine 0.1 % nasal spray Commonly known as: ASTELIN USE 2 SPRAYS IN EACH NOSTRIL TWICE DAILY AS NEEDED FOR RUNNY NOSE/DRAINAGE DOWN THROAT   bimatoprost 0.01 % Soln Commonly known as: LUMIGAN Place 1 drop into both eyes at bedtime.   brimonidine 0.2 % ophthalmic solution Commonly known as: ALPHAGAN Place 1 drop into both eyes 3 (three) times daily.   budesonide-formoterol 160-4.5 MCG/ACT inhaler Commonly known as: Symbicort Inhale 2 puffs into the lungs in the morning and at  bedtime.   clindamycin 1 % lotion Commonly known as: CLEOCIN T Apply topically 2 (two) times daily.   colchicine 0.6 MG tablet Take 1 tablet (0.6 mg total) by mouth daily.   cyanocobalamin 500 MCG tablet Commonly known as: VITAMIN B12 Take 1 tablet (500 mcg total) by mouth daily. Start taking on: July 10, 2023   dexlansoprazole 60 MG capsule Commonly known as: Dexilant Take 1 capsule (60 mg total) by mouth daily.   dorzolamide-timolol 2-0.5 % ophthalmic solution Commonly known as: COSOPT Place 1 drop into both eyes 2 (two) times daily.   famotidine 40 MG tablet Commonly known as: Pepcid Take 1 tablet (40 mg total) by mouth at bedtime.   fluticasone 50 MCG/ACT nasal spray Commonly known as: FLONASE Place 1 spray into both nostrils daily.   gabapentin 300 MG capsule Commonly known as: NEURONTIN Start at 300 p.o. 3 times daily.  If tolerated work up to 600 mg p.o. 3 times daily What changed:  how much to take how to take this when to take this additional instructions   Gamunex-C 20 GM/200ML Soln Generic drug: Immune Globulin (Human)   HYDROcodone-acetaminophen  5-325 MG tablet Commonly known as: NORCO/VICODIN Take 1 tablet by mouth every 4 (four) hours as needed for up to 5 days.   loratadine 10 MG tablet Commonly known as: CLARITIN Take 1 tablet (10 mg total) by mouth daily.   metoCLOPramide 10 MG tablet Commonly known as: REGLAN Take 1 tablet (10 mg total) by mouth every 8 (eight) hours as needed for nausea.   mometasone 0.1 % cream Commonly known as: ELOCON Apply 1 Application topically 2 (two) times daily. What changed:  when to take this reasons to take this   montelukast 10 MG tablet Commonly known as: Singulair Take 1 tablet (10 mg total) by mouth at bedtime.   mycophenolate 500 MG tablet Commonly known as: CELLCEPT Take 500 mg by mouth 2 (two) times daily.   ondansetron 8 MG tablet Commonly known as: ZOFRAN Take 1 tablet (8 mg total) by mouth every 8 (eight) hours as needed for nausea or vomiting.   polyethylene glycol powder 17 GM/SCOOP powder Commonly known as: GLYCOLAX/MIRALAX Take 17 g by mouth daily. What changed:  when to take this reasons to take this   potassium chloride 20 MEQ packet Commonly known as: KLOR-CON Take 20 mEq by mouth daily. Start taking on: July 10, 2023   Retin-A Micro 0.04 % gel Generic drug: tretinoin microspheres Apply 1 application  topically daily as needed.   Rhopressa 0.02 % Soln Generic drug: Netarsudil Dimesylate Place 1 drop into the right eye daily.   sucralfate 1 GM/10ML suspension Commonly known as: Carafate Take 10 mLs (1 g total) by mouth 4 (four) times daily -  with meals and at bedtime.               Durable Medical Equipment  (From admission, onward)           Start     Ordered   07/09/23 1428  For home use only DME Bedside commode  Once       Comments: Patient follows with bariatric.  Question:  Patient needs a bedside commode to treat with the following condition  Answer:  BMI 45.0-49.9, adult [facility])   07/09/23 1428           Discharge  Instructions: Please refer to Patient Instructions section of EMR for full details.  Patient was counseled important signs and symptoms that  should prompt return to medical care, changes in medications, dietary instructions, activity restrictions, and follow up appointments.   Follow-Up Appointments:  Future Appointments  Date Time Provider Department Center  07/10/2023  8:50 AM Reyna Cava K, DO [facility] None  07/12/2023  8:50 AM ACCESS TO CARE POOL [facility]  08/01/2023 11:20 AM Lylia Sand, MD [facility] CPR  08/03/2023 10:00 AM Margorie Shelter, RD [facility] NDM  08/04/2023  3:40 PM Armbruster, Lendon Queen, MD [facility]  08/08/2023 11:15 AM Floyce Hutching, DPM [facility]  11/23/2023 11:50 AM [facility] ANNUAL WELLNESS VISIT [facility]     Dema Filler, MD 07/09/2023, 4:43 PM PGY-1, [facility] Family Medicine   I agree with the assessment and plan as documented above.  Genetta Kenning, MD PGY-2, [facility] Family [facility]

## 2023-07-09 NOTE — Plan of Care (Signed)
 Problem: Education: Goal: Knowledge of General Education information will improve Description: Including pain rating scale, medication(s)/side effects and non-pharmacologic comfort measures 07/09/2023 1608 by Unknown Schleyer, Donnel Venuto K, RN Outcome: Adequate for Discharge 07/09/2023 1608 by Eleonore Grill, RN Outcome: Progressing 07/09/2023 1608 by Nyesha Cliff K, RN Outcome: Adequate for Discharge   Problem: Health Behavior/Discharge Planning: Goal: Ability to manage health-related needs will improve 07/09/2023 1608 by Jary Louvier K, RN Outcome: Adequate for Discharge 07/09/2023 1608 by Eleonore Grill, RN Outcome: Progressing 07/09/2023 1608 by Iyad Deroo K, RN Outcome: Adequate for Discharge   Problem: Clinical Measurements: Goal: Ability to maintain clinical measurements within normal limits will improve 07/09/2023 1608 by Pahola Dimmitt K, RN Outcome: Adequate for Discharge 07/09/2023 1608 by Eleonore Grill, RN Outcome: Progressing 07/09/2023 1608 by Jahni Paul K, RN Outcome: Adequate for Discharge Goal: Will remain free from infection 07/09/2023 1608 by Elicia Lui K, RN Outcome: Adequate for Discharge 07/09/2023 1608 by Eleonore Grill, RN Outcome: Progressing 07/09/2023 1608 by Kashauna Celmer K, RN Outcome: Adequate for Discharge Goal: Diagnostic test results will improve 07/09/2023 1608 by Ailee Pates K, RN Outcome: Adequate for Discharge 07/09/2023 1608 by Eleonore Grill, RN Outcome: Progressing 07/09/2023 1608 by Artavis Cowie K, RN Outcome: Adequate for Discharge Goal: Respiratory complications will improve 07/09/2023 1608 by Eleonore Grill, RN Outcome: Adequate for Discharge 07/09/2023 1608 by Eleonore Grill, RN Outcome: Progressing 07/09/2023 1608 by Zhanae Proffit K, RN Outcome: Adequate for Discharge Goal: Cardiovascular complication will be avoided 07/09/2023 1608 by Camryn Quesinberry K, RN Outcome: Adequate for Discharge 07/09/2023 1608 by ,   K, RN Outcome: Progressing 07/09/2023 1608 by Willean Schurman K, RN Outcome: Adequate for Discharge   Problem: Activity: Goal: Risk for activity intolerance will decrease 07/09/2023 1608 by Micayla Brathwaite K, RN Outcome: Adequate for Discharge 07/09/2023 1608 by Eleonore Grill, RN Outcome: Progressing 07/09/2023 1608 by Eleonore Grill, RN Outcome: Adequate for Discharge   Problem: Nutrition: Goal: Adequate nutrition will be maintained 07/09/2023 1608 by Charleston Vierling K, RN Outcome: Adequate for Discharge 07/09/2023 1608 by Eleonore Grill, RN Outcome: Progressing 07/09/2023 1608 by Eleonore Grill, RN Outcome: Adequate for Discharge   Problem: Coping: Goal: Level of anxiety will decrease 07/09/2023 1608 by Jamelle Noy K, RN Outcome: Adequate for Discharge 07/09/2023 1608 by Eleonore Grill, RN Outcome: Progressing 07/09/2023 1608 by Eleonore Grill, RN Outcome: Adequate for Discharge   Problem: Elimination: Goal: Will not experience complications related to bowel motility 07/09/2023 1608 by Kidus Delman K, RN Outcome: Adequate for Discharge 07/09/2023 1608 by Eleonore Grill, RN Outcome: Progressing 07/09/2023 1608 by Gal Feldhaus K, RN Outcome: Adequate for Discharge Goal: Will not experience complications related to urinary retention 07/09/2023 1608 by Arlean Thies K, RN Outcome: Adequate for Discharge 07/09/2023 1608 by Eleonore Grill, RN Outcome: Progressing 07/09/2023 1608 by Pervis Macintyre K, RN Outcome: Adequate for Discharge   Problem: Pain Managment: Goal: General experience of comfort will improve and/or be controlled 07/09/2023 1608 by Dannika Hilgeman K, RN Outcome: Adequate for Discharge 07/09/2023 1608 by Eleonore Grill, RN Outcome: Progressing 07/09/2023 1608 by Octavio Matheney K, RN Outcome: Adequate for Discharge   Problem: Safety: Goal: Ability to remain free from injury will improve 07/09/2023 1608 by Dahna Hattabaugh K, RN Outcome:  Adequate for Discharge 07/09/2023 1608 by Eleonore Grill, RN Outcome: Progressing 07/09/2023 1608 by Eleonore Grill, RN Outcome: Adequate for Discharge   Problem: Skin Integrity: Goal: Risk for impaired  skin integrity will decrease 07/09/2023 1608 by Ulanda Tackett K, RN Outcome: Adequate for Discharge 07/09/2023 1608 by Sedale Jenifer K, RN Outcome: Progressing 07/09/2023 1608 by Courtlyn Aki K, RN Outcome: Adequate for Discharge

## 2023-07-09 NOTE — Assessment & Plan Note (Deleted)
 MR lumbar spine showed minimal disc bulging at L5-S1, no stenosis. -MIVF as above -PT/OT consulted -Decrease gabapentin to 300 mg 3 times daily to prevent oversedation -Hold home mycophenolate in the setting of acute illness -Fall precautions -Pain management: Acetaminophen 650 mg every 6 hours as needed, consider adding on additional agent if persistent pain

## 2023-07-09 NOTE — TOC Transition Note (Signed)
 Transition of Care [facility]) - Discharge Note   Patient Details  Name: Tiffany Velasquez MRN: [id number] Date of Birth: [birth year]  Transition of Care [employer] CM/SW Contact:  Jannine Meo, RN Phone Number: 07/09/2023, 2:01 PM   Clinical Narrative:   Secure message from provider that patient needs bariatric BSC. DME ordered through [employer] weekend rep to be delivered to bedside before patient is discharged home. Provider aware of need for order and narrative note for DME.          Patient Goals and CMS Choice            Discharge Placement                       Discharge Plan and Services Additional resources added to the After Visit Summary for                  DME Arranged: Bedside commode (Bariatric) DME Agency: [employer] Date DME Agency Contacted: 07/09/23 Time DME Agency Contacted: [id number] Representative spoke with at DME Agency: [facility]            Social Drivers of Health (SDOH) Interventions SDOH Screenings   Food Insecurity: No Food Insecurity (07/08/2023)  Housing: Low Risk  (07/08/2023)  Transportation Needs: No Transportation Needs (07/08/2023)  Utilities: Not At Risk (07/08/2023)  Alcohol Screen: Low Risk  (11/18/2022)  Depression (PHQ2-9): Low Risk  (06/05/2023)  Recent Concern: Depression (PHQ2-9) - High Risk (05/29/2023)  Financial Resource Strain: Low Risk  (05/10/2023)  Physical Activity: Inactive (11/18/2022)  Social Connections: Moderately Integrated (07/08/2023)  Stress: No Stress Concern Present (11/18/2022)  Tobacco Use: Low Risk  (07/04/2023)  Health Literacy: Adequate Health Literacy (11/18/2022)     Readmission Risk Interventions     No data to display

## 2023-07-10 ENCOUNTER — Ambulatory Visit (INDEPENDENT_AMBULATORY_CARE_PROVIDER_SITE_OTHER): Admitting: Neurology

## 2023-07-10 ENCOUNTER — Other Ambulatory Visit

## 2023-07-10 ENCOUNTER — Encounter: Payer: Self-pay | Admitting: Student

## 2023-07-10 VITALS — BP 143/107 | HR 124 | Ht 69.0 in

## 2023-07-10 DIAGNOSIS — M79672 Pain in left foot: Secondary | ICD-10-CM | POA: Diagnosis not present

## 2023-07-10 DIAGNOSIS — G629 Polyneuropathy, unspecified: Secondary | ICD-10-CM | POA: Diagnosis not present

## 2023-07-10 DIAGNOSIS — R202 Paresthesia of skin: Secondary | ICD-10-CM

## 2023-07-10 DIAGNOSIS — M79671 Pain in right foot: Secondary | ICD-10-CM | POA: Diagnosis not present

## 2023-07-10 LAB — ANA: Anti Nuclear Antibody (ANA): NEGATIVE

## 2023-07-10 MED ORDER — DULOXETINE HCL 30 MG PO CPEP: 30.0000 mg | ORAL_CAPSULE | Freq: Every day | ORAL | 3 refills | Status: DC

## 2023-07-10 NOTE — Progress Notes (Unsigned)
 [facility] Neurology Division Clinic Note - Initial Visit   Date: 07/10/2023   Tiffany Velasquez MRN: [id number] DOB: [birth year]   Dear Dr. Lum Babe:  Thank you for your kind referral of Tiffany Velasquez for consultation of bilateral feet pain. Although her history is well known to you, please allow [place] to reiterate it for the purpose of our medical record. The patient was accompanied to the clinic by mother who also provides collateral information.     Tiffany Velasquez is a 28 y.o. 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   IMPRESSION/PLAN: Bilateral feet pain and generalized weakness.  Possibly due to vitamin deficiency due to poor PO intake.  Vitamin B12 is 194.  She is not diabetic not drink alcohol.  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   - Check folate, vitamin B1, copper, SPEP with IFE, c/p-ANCA  - NCS/EMG BLE  - Start vitamin B12 daily  - Start duloxetine 30mg  daily   - Continue gabapentin 600mg  TID  - Home PT/OT offered, opted to hold as patient unable to engage due to pain  Further recommendations pending results.  ------------------------------------------------------------- History of present illness: Starting in February 2025, she began having shooting pain involving all the toes.  She was found to have elevated uric acid and treated as gout.  Symptoms improved.  Two weeks later, she began having similar burning and shooting pain involving the feet.  She began having shooting pain in the legs. She saw [facility] who started gabapentin 300mg  at bedtime.  The dose was increased to gabapentin 600mg  three times daily by orthopeadics.  She had minimal relief at this dose.   She was admitted at [facility] 4/11-4/13  with dehydration, generalized weakness, and AKI thought to be due to viral gastroenteritis.  She had MRI lumbar spine which shows mild disc bulge at L5-S1.  Her vitamin B12 was low at 194.  She has not started supplements yet.    She lost 70lb over 6 months and is planning to have bariatric surgery in the summer.  She is not on a daily multivitamin.  Nonsmoker.  She does not drink alcohol.    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    Past Surgical History:  Procedure Laterality Date   BIOPSY  04/04/2023   Procedure: BIOPSY;  Surgeon: Quentin Ore, MD;  Location: [facility];  Service: General;;   CATARACT EXTRACTION     ESOPHAGOGASTRODUODENOSCOPY N/A 04/04/2023   Procedure: ESOPHAGOGASTRODUODENOSCOPY (EGD);  Surgeon: Quentin Ore, MD;  Location: [facility];  Service: General;  Laterality: N/A;   PORTA CATH INSERTION       Medications:  Outpatient Encounter Medications as of 07/10/2023  Medication Sig  Note   acetaminophen (TYLENOL) 500 MG tablet Take 2 tablets (1,000 mg total) by mouth every 6 (six) hours as needed for mild pain (pain score 1-3) or moderate pain (pain score 4-6).    acetaZOLAMIDE (DIAMOX) 250 MG tablet Take 250 mg by mouth 2 (two) times daily.    Adalimumab 40 MG/0.4ML PNKT Inject 40 mg into the skin every 14 (fourteen) days.    albuterol (PROVENTIL) (2.5 MG/3ML) 0.083% nebulizer solution Take 3 mLs (2.5 mg total) by nebulization every  6 (six) hours as needed for wheezing or shortness of breath.    albuterol (VENTOLIN HFA) 108 (90 Base) MCG/ACT inhaler Inhale 2 puffs every 4-6 hours as needed for cough, wheeze, tightness in chest, or shortness of breath    allopurinol (ZYLOPRIM) 100 MG tablet Take 1 tablet (100 mg total) by mouth daily.    azelastine (ASTELIN) 0.1 % nasal spray USE 2 SPRAYS IN EACH NOSTRIL TWICE DAILY AS NEEDED FOR RUNNY NOSE/DRAINAGE DOWN THROAT    bimatoprost (LUMIGAN) 0.01 % SOLN Place 1 drop into both eyes at bedtime.    brimonidine (ALPHAGAN) 0.2 % ophthalmic solution Place 1 drop into both eyes 3 (three) times daily.    budesonide-formoterol (SYMBICORT) 160-4.5 MCG/ACT inhaler Inhale 2 puffs into the lungs in the morning and at bedtime.    clindamycin (CLEOCIN T) 1 % lotion Apply topically 2 (two) times daily.    colchicine 0.6 MG tablet Take 1 tablet (0.6 mg total) by mouth daily.    dexlansoprazole (DEXILANT) 60 MG capsule Take 1 capsule (60 mg total) by mouth daily.    dorzolamide-timolol (COSOPT) 2-0.5 % ophthalmic solution Place 1 drop into both eyes 2 (two) times daily.    DULoxetine (CYMBALTA) 30 MG capsule Take 1 capsule (30 mg total) by mouth daily.    famotidine (PEPCID) 40 MG tablet Take 1 tablet (40 mg total) by mouth at bedtime.    fluticasone (FLONASE) 50 MCG/ACT nasal spray Place 1 spray into both nostrils daily. 01/24/2023: PRN   gabapentin (NEURONTIN) 300 MG capsule Start at 300 p.o. 3 times daily.  If tolerated work up to 600 mg p.o. 3 times daily (Patient taking differently: Take 600 mg by mouth 3 (three) times daily.)    GAMUNEX-C 20 GM/200ML SOLN  12/19/2022: Intermittent infusion   HYDROcodone-acetaminophen (NORCO/VICODIN) 5-325 MG tablet Take 1 tablet by mouth every 4 (four) hours as needed for up to 5 days.    loratadine (CLARITIN) 10 MG tablet Take 1 tablet (10 mg total) by mouth daily.    metoCLOPramide (REGLAN) 10 MG tablet Take 1 tablet (10 mg total) by mouth every 8 (eight) hours  as needed for nausea.    mometasone (ELOCON) 0.1 % cream Apply 1 Application topically 2 (two) times daily. (Patient taking differently: Apply 1 Application topically 2 (two) times daily as needed.)    montelukast (SINGULAIR) 10 MG tablet Take 1 tablet (10 mg total) by mouth at bedtime.    mycophenolate (CELLCEPT) 500 MG tablet Take 500 mg by mouth 2 (two) times daily.    [Paused] Olmesartan-amLODIPine-HCTZ 20-5-12.5 MG TABS TAKE 1 TABLET BY MOUTH DAILY    ondansetron (ZOFRAN) 8 MG tablet Take 1 tablet (8 mg total) by mouth every 8 (eight) hours as needed for nausea or vomiting.    polyethylene glycol powder (GLYCOLAX/MIRALAX) 17 GM/SCOOP powder Take 17 g by mouth daily. (Patient taking differently: Take 17 g by mouth daily as needed for mild constipation.)    potassium chloride (KLOR-CON) 20 MEQ  packet Take 20 mEq by mouth daily.    RETIN-A MICRO 0.04 % gel Apply 1 application  topically daily as needed.    RHOPRESSA 0.02 % SOLN Place 1 drop into the right eye daily.    sucralfate (CARAFATE) 1 GM/10ML suspension Take 10 mLs (1 g total) by mouth 4 (four) times daily -  with meals and at bedtime.    vitamin B-12 (VITAMIN B12) 500 MCG tablet Take 1 tablet (500 mcg total) by mouth daily.    No facility-administered encounter medications on file as of 07/10/2023.    Allergies:  Allergies  Allergen Reactions   Other Itching, Rash and Swelling    Seafood,tomato paste, peanut butter, peaches, oranges, apples Throat swelling  Dust mite, oak trees, grass- causes rash, itching   Peanut-Containing Drug Products Anaphylaxis   Shellfish Allergy Anaphylaxis    Throat swelling    Apple Juice Rash   Orange Fruit [Citrus] Rash   Peach Flavoring Agent (Non-Screening) Rash   Tomato Rash, Hives and Itching   Tree Extract Rash    Family History: Family History  Problem Relation Age of Onset   Asthma Mother    Allergic rhinitis Father    Sudden death Cousin    Eczema Neg Hx    Urticaria Neg Hx      Social History: Social History   Tobacco Use   Smoking status: Never   Smokeless tobacco: Never  Vaping Use   Vaping status: Never Used  Substance Use Topics   Alcohol use: Yes    Comment: rarely   Drug use: No   Social History   Social History Narrative   Not on file    Vital Signs:  BP (!) 143/107   Pulse (!) 124   Ht 5\' 9"  (1.753 m)   SpO2 100%   BMI 47.99 kg/m   Neurological Exam: MENTAL STATUS including orientation to time, place, person, recent and remote memory, attention span and concentration, language, and fund of knowledge is normal.  Speech is not dysarthric.  CRANIAL NERVES: II:  No visual field defects.     III-IV-VI: Pupils equal round and reactive to light.  Normal conjugate, extra-ocular eye movements in all directions of gaze.  No nystagmus.  No ptosis.   V:  Normal facial sensation.    VII:  Normal facial symmetry and movements.   VIII:  Normal hearing and vestibular function.   IX-X:  Normal palatal movement.   XI:  Normal shoulder shrug and head rotation.   XII:  Normal tongue strength and range of motion, no deviation or fasciculation.  MOTOR:  No atrophy, fasciculations or abnormal movements.  No pronator drift.   Upper Extremity:  Right  Left  Deltoid  5/5   5/5   Biceps  5/5   5/5   Triceps  5/5   5/5   Wrist extensors  5/5   5/5   Wrist flexors  5/5   5/5   Finger extensors  5/5   5/5   Finger flexors  5/5   5/5   Dorsal interossei  5/5   5/5   Abductor pollicis  5/5   5/5   Tone (Ashworth scale)  0  0   Lower Extremity:  Right  Left  Hip flexors  5-/5   5-/5   Knee flexors  5/5   5/5   Knee extensors  5/5   5/5   Dorsiflexors  5/5   5/5   Plantarflexors  5/5  5/5   Toe extensors  5/5   5/5   Toe flexors  5/5   5/5   Tone (Ashworth scale)  0  0   MSRs:                                           Right        Left brachioradialis 2+  2+  biceps 2+  2+  triceps 2+  2+  patellar 2+  2+  ankle jerk 0  0  Hoffman no  no   plantar response down  down   SENSORY:  Absent vibration, temperature, and pin prick below the ankles bilaterally.  Sensation in the hands intact.  COORDINATION/GAIT: Normal finger-to- nose-finger.  Intact rapid alternating movements bilaterally.  Gait not tested, patient arrived in transport chair.     Thank you for allowing me to participate in patient's care.  If I can answer any additional questions, I would be pleased to do so.    Sincerely,    Erikah Thumm K. Lydia Sams, DO

## 2023-07-10 NOTE — Patient Instructions (Addendum)
 Check labs  Nerve testing of the legs  Start duloxetine 30mg  daily  Continue gabapentin 600mg  three times daily  Start vitamin B12 1000mcg daily. This is an over the counter medication, you may pick up at your pharmacy.  ELECTROMYOGRAM AND NERVE CONDUCTION STUDIES (EMG/NCS) INSTRUCTIONS  How to Prepare The neurologist conducting the EMG will need to know if you have certain medical conditions. Tell the neurologist and other EMG lab personnel if you: Have a pacemaker or any other electrical medical device Take blood-thinning medications Have hemophilia, a blood-clotting disorder that causes prolonged bleeding Bathing Take a shower or bath shortly before your exam in order to remove oils from your skin. Don't apply lotions or creams before the exam.  What to Expect You'll likely be asked to change into a hospital gown for the procedure and lie down on an examination table. The following explanations can help you understand what will happen during the exam.  Electrodes. The neurologist or a technician places surface electrodes at various locations on your skin depending on where you're experiencing symptoms. Or the neurologist may insert needle electrodes at different sites depending on your symptoms.  Sensations. The electrodes will at times transmit a tiny electrical current that you may feel as a twinge or spasm. The needle electrode may cause discomfort or pain that usually ends shortly after the needle is removed. If you are concerned about discomfort or pain, you may want to talk to the neurologist about taking a short break during the exam.  Instructions. During the needle EMG, the neurologist will assess whether there is any spontaneous electrical activity when the muscle is at rest - activity that isn't present in healthy muscle tissue - and the degree of activity when you slightly contract the muscle.  He or she will give you instructions on resting and contracting a muscle at  appropriate times. Depending on what muscles and nerves the neurologist is examining, he or she may ask you to change positions during the exam.  After your EMG You may experience some temporary, minor bruising where the needle electrode was inserted into your muscle. This bruising should fade within several days. If it persists, contact your primary care doctor.

## 2023-07-11 LAB — CYCLIC CITRUL PEPTIDE ANTIBODY, IGG/IGA: CCP Antibodies IgG/IgA: 12 U (ref 0–19)

## 2023-07-11 LAB — RHEUMATOID FACTOR: Rheumatoid fact SerPl-aCnc: <10 [IU]/mL (ref <14.0)

## 2023-07-12 ENCOUNTER — Emergency Department (HOSPITAL_COMMUNITY)
Admission: EM | Admit: 2023-07-12 | Discharge: 2023-07-12 | Disposition: A | Discharge status: Home / Self Care | Source: Home / Self Care | Attending: Emergency Medicine | Admitting: Emergency Medicine

## 2023-07-12 ENCOUNTER — Ambulatory Visit (INDEPENDENT_AMBULATORY_CARE_PROVIDER_SITE_OTHER): Payer: Self-pay | Admitting: Student

## 2023-07-12 DIAGNOSIS — R918 Other nonspecific abnormal finding of lung field: Secondary | ICD-10-CM | POA: Diagnosis not present

## 2023-07-12 DIAGNOSIS — R195 Other fecal abnormalities: Secondary | ICD-10-CM | POA: Diagnosis not present

## 2023-07-12 DIAGNOSIS — G63 Polyneuropathy in diseases classified elsewhere: Secondary | ICD-10-CM | POA: Diagnosis present

## 2023-07-12 DIAGNOSIS — R7982 Elevated C-reactive protein (CRP): Secondary | ICD-10-CM | POA: Diagnosis not present

## 2023-07-12 DIAGNOSIS — H3093 Unspecified chorioretinal inflammation, bilateral: Secondary | ICD-10-CM | POA: Diagnosis present

## 2023-07-12 DIAGNOSIS — E569 Vitamin deficiency, unspecified: Secondary | ICD-10-CM | POA: Diagnosis not present

## 2023-07-12 DIAGNOSIS — G8929 Other chronic pain: Secondary | ICD-10-CM | POA: Diagnosis present

## 2023-07-12 DIAGNOSIS — R29898 Other symptoms and signs involving the musculoskeletal system: Secondary | ICD-10-CM | POA: Diagnosis not present

## 2023-07-12 DIAGNOSIS — D62 Acute posthemorrhagic anemia: Secondary | ICD-10-CM | POA: Diagnosis not present

## 2023-07-12 DIAGNOSIS — E5111 Dry beriberi: Secondary | ICD-10-CM | POA: Diagnosis not present

## 2023-07-12 DIAGNOSIS — E538 Deficiency of other specified B group vitamins: Secondary | ICD-10-CM | POA: Diagnosis not present

## 2023-07-12 DIAGNOSIS — R601 Generalized edema: Secondary | ICD-10-CM | POA: Diagnosis not present

## 2023-07-12 DIAGNOSIS — R531 Weakness: Secondary | ICD-10-CM | POA: Diagnosis present

## 2023-07-12 DIAGNOSIS — R112 Nausea with vomiting, unspecified: Secondary | ICD-10-CM | POA: Diagnosis not present

## 2023-07-12 DIAGNOSIS — K838 Other specified diseases of biliary tract: Secondary | ICD-10-CM | POA: Diagnosis not present

## 2023-07-12 DIAGNOSIS — E860 Dehydration: Secondary | ICD-10-CM | POA: Diagnosis present

## 2023-07-12 DIAGNOSIS — R600 Localized edema: Secondary | ICD-10-CM | POA: Diagnosis not present

## 2023-07-12 DIAGNOSIS — R2681 Unsteadiness on feet: Secondary | ICD-10-CM | POA: Insufficient documentation

## 2023-07-12 DIAGNOSIS — F4323 Adjustment disorder with mixed anxiety and depressed mood: Secondary | ICD-10-CM | POA: Diagnosis not present

## 2023-07-12 DIAGNOSIS — I10 Essential (primary) hypertension: Secondary | ICD-10-CM | POA: Diagnosis present

## 2023-07-12 DIAGNOSIS — R634 Abnormal weight loss: Secondary | ICD-10-CM | POA: Diagnosis present

## 2023-07-12 DIAGNOSIS — R000 Tachycardia, unspecified: Secondary | ICD-10-CM | POA: Insufficient documentation

## 2023-07-12 DIAGNOSIS — Z91010 Allergy to peanuts: Secondary | ICD-10-CM | POA: Insufficient documentation

## 2023-07-12 DIAGNOSIS — M79672 Pain in left foot: Secondary | ICD-10-CM | POA: Diagnosis not present

## 2023-07-12 DIAGNOSIS — J690 Pneumonitis due to inhalation of food and vomit: Secondary | ICD-10-CM | POA: Diagnosis present

## 2023-07-12 DIAGNOSIS — D638 Anemia in other chronic diseases classified elsewhere: Secondary | ICD-10-CM | POA: Diagnosis present

## 2023-07-12 DIAGNOSIS — M79606 Pain in leg, unspecified: Secondary | ICD-10-CM | POA: Diagnosis not present

## 2023-07-12 DIAGNOSIS — F39 Unspecified mood [affective] disorder: Secondary | ICD-10-CM | POA: Diagnosis not present

## 2023-07-12 DIAGNOSIS — M609 Myositis, unspecified: Secondary | ICD-10-CM | POA: Diagnosis not present

## 2023-07-12 DIAGNOSIS — M60852 Other myositis, left thigh: Secondary | ICD-10-CM | POA: Diagnosis not present

## 2023-07-12 DIAGNOSIS — R1114 Bilious vomiting: Secondary | ICD-10-CM | POA: Diagnosis not present

## 2023-07-12 DIAGNOSIS — M60862 Other myositis, left lower leg: Secondary | ICD-10-CM | POA: Diagnosis present

## 2023-07-12 DIAGNOSIS — R339 Retention of urine, unspecified: Secondary | ICD-10-CM | POA: Diagnosis not present

## 2023-07-12 DIAGNOSIS — H4060X0 Glaucoma secondary to drugs, unspecified eye, stage unspecified: Secondary | ICD-10-CM | POA: Diagnosis not present

## 2023-07-12 DIAGNOSIS — R7401 Elevation of levels of liver transaminase levels: Secondary | ICD-10-CM | POA: Diagnosis not present

## 2023-07-12 DIAGNOSIS — G629 Polyneuropathy, unspecified: Secondary | ICD-10-CM | POA: Diagnosis not present

## 2023-07-12 DIAGNOSIS — J90 Pleural effusion, not elsewhere classified: Secondary | ICD-10-CM | POA: Diagnosis not present

## 2023-07-12 DIAGNOSIS — M6282 Rhabdomyolysis: Secondary | ICD-10-CM | POA: Diagnosis present

## 2023-07-12 DIAGNOSIS — K909 Intestinal malabsorption, unspecified: Secondary | ICD-10-CM | POA: Diagnosis present

## 2023-07-12 DIAGNOSIS — R202 Paresthesia of skin: Secondary | ICD-10-CM | POA: Insufficient documentation

## 2023-07-12 DIAGNOSIS — K760 Fatty (change of) liver, not elsewhere classified: Secondary | ICD-10-CM | POA: Diagnosis present

## 2023-07-12 DIAGNOSIS — T380X5A Adverse effect of glucocorticoids and synthetic analogues, initial encounter: Secondary | ICD-10-CM | POA: Diagnosis present

## 2023-07-12 DIAGNOSIS — H44113 Panuveitis, bilateral: Secondary | ICD-10-CM | POA: Diagnosis present

## 2023-07-12 DIAGNOSIS — R7989 Other specified abnormal findings of blood chemistry: Secondary | ICD-10-CM | POA: Diagnosis not present

## 2023-07-12 DIAGNOSIS — I110 Hypertensive heart disease with heart failure: Secondary | ICD-10-CM | POA: Diagnosis not present

## 2023-07-12 DIAGNOSIS — M109 Gout, unspecified: Secondary | ICD-10-CM | POA: Diagnosis present

## 2023-07-12 DIAGNOSIS — E872 Acidosis, unspecified: Secondary | ICD-10-CM | POA: Diagnosis not present

## 2023-07-12 DIAGNOSIS — N390 Urinary tract infection, site not specified: Secondary | ICD-10-CM | POA: Diagnosis not present

## 2023-07-12 DIAGNOSIS — R700 Elevated erythrocyte sedimentation rate: Secondary | ICD-10-CM | POA: Diagnosis not present

## 2023-07-12 DIAGNOSIS — K5901 Slow transit constipation: Secondary | ICD-10-CM | POA: Diagnosis not present

## 2023-07-12 DIAGNOSIS — R748 Abnormal levels of other serum enzymes: Secondary | ICD-10-CM | POA: Diagnosis not present

## 2023-07-12 DIAGNOSIS — M792 Neuralgia and neuritis, unspecified: Secondary | ICD-10-CM | POA: Diagnosis not present

## 2023-07-12 DIAGNOSIS — M6088 Other myositis, other site: Secondary | ICD-10-CM | POA: Diagnosis not present

## 2023-07-12 DIAGNOSIS — Z6841 Body Mass Index (BMI) 40.0 and over, adult: Secondary | ICD-10-CM | POA: Diagnosis not present

## 2023-07-12 DIAGNOSIS — M60859 Other myositis, unspecified thigh: Secondary | ICD-10-CM | POA: Diagnosis not present

## 2023-07-12 DIAGNOSIS — E519 Thiamine deficiency, unspecified: Secondary | ICD-10-CM | POA: Diagnosis present

## 2023-07-12 DIAGNOSIS — R5381 Other malaise: Secondary | ICD-10-CM | POA: Diagnosis not present

## 2023-07-12 DIAGNOSIS — D84821 Immunodeficiency due to drugs: Secondary | ICD-10-CM | POA: Diagnosis present

## 2023-07-12 DIAGNOSIS — E871 Hypo-osmolality and hyponatremia: Secondary | ICD-10-CM | POA: Diagnosis not present

## 2023-07-12 DIAGNOSIS — F419 Anxiety disorder, unspecified: Secondary | ICD-10-CM | POA: Diagnosis not present

## 2023-07-12 DIAGNOSIS — R1110 Vomiting, unspecified: Secondary | ICD-10-CM | POA: Diagnosis not present

## 2023-07-12 DIAGNOSIS — E876 Hypokalemia: Secondary | ICD-10-CM | POA: Diagnosis not present

## 2023-07-12 DIAGNOSIS — D801 Nonfamilial hypogammaglobulinemia: Secondary | ICD-10-CM | POA: Diagnosis present

## 2023-07-12 DIAGNOSIS — R300 Dysuria: Secondary | ICD-10-CM | POA: Diagnosis not present

## 2023-07-12 DIAGNOSIS — M51379 Other intervertebral disc degeneration, lumbosacral region without mention of lumbar back pain or lower extremity pain: Secondary | ICD-10-CM | POA: Diagnosis not present

## 2023-07-12 DIAGNOSIS — I5032 Chronic diastolic (congestive) heart failure: Secondary | ICD-10-CM | POA: Diagnosis not present

## 2023-07-12 DIAGNOSIS — Q998 Other specified chromosome abnormalities: Secondary | ICD-10-CM | POA: Diagnosis not present

## 2023-07-12 DIAGNOSIS — I071 Rheumatic tricuspid insufficiency: Secondary | ICD-10-CM | POA: Diagnosis present

## 2023-07-12 DIAGNOSIS — K5900 Constipation, unspecified: Secondary | ICD-10-CM | POA: Diagnosis not present

## 2023-07-12 DIAGNOSIS — M79671 Pain in right foot: Secondary | ICD-10-CM | POA: Diagnosis not present

## 2023-07-12 DIAGNOSIS — G9600 Cerebrospinal fluid leak, unspecified: Secondary | ICD-10-CM | POA: Diagnosis not present

## 2023-07-12 DIAGNOSIS — J45909 Unspecified asthma, uncomplicated: Secondary | ICD-10-CM | POA: Diagnosis present

## 2023-07-12 LAB — CBC WITH DIFFERENTIAL/PLATELET
Abs Immature Granulocytes: 0.02 10*3/uL (ref 0.00–0.07)
Basophils Absolute: 0 10*3/uL (ref 0.0–0.1)
Basophils Relative: 0 %
Eosinophils Absolute: 0 10*3/uL (ref 0.0–0.5)
Eosinophils Relative: 0 %
HCT: 31.2 % — ABNORMAL LOW (ref 36.0–46.0)
Hemoglobin: 10.2 g/dL — ABNORMAL LOW (ref 12.0–15.0)
Immature Granulocytes: 0 %
Lymphocytes Relative: 22 %
Lymphs Abs: 1.1 10*3/uL (ref 0.7–4.0)
MCH: 29.2 pg (ref 26.0–34.0)
MCHC: 32.7 g/dL (ref 30.0–36.0)
MCV: 89.4 fL (ref 80.0–100.0)
Monocytes Absolute: 0.6 10*3/uL (ref 0.1–1.0)
Monocytes Relative: 13 %
Neutro Abs: 3.2 10*3/uL (ref 1.7–7.7)
Neutrophils Relative %: 65 %
Platelets: 165 10*3/uL (ref 150–400)
RBC: 3.49 MIL/uL — ABNORMAL LOW (ref 3.87–5.11)
RDW: 23.6 % — ABNORMAL HIGH (ref 11.5–15.5)
Smear Review: ADEQUATE
WBC: 5 10*3/uL (ref 4.0–10.5)
nRBC: 0.4 % — ABNORMAL HIGH (ref 0.0–0.2)

## 2023-07-12 LAB — BASIC METABOLIC PANEL WITH GFR
BUN: 43 mg/dL — ABNORMAL HIGH (ref 6–20)
Calcium: 10.9 mg/dL — ABNORMAL HIGH (ref 8.7–10.2)
Chloride: 102 mmol/L (ref 96–106)
Glucose: 98 mg/dL (ref 70–99)
Sodium: 140 mmol/L (ref 134–144)

## 2023-07-12 LAB — LIPASE, BLOOD: Lipase: 68 U/L — ABNORMAL HIGH (ref 11–51)

## 2023-07-12 LAB — COMPREHENSIVE METABOLIC PANEL WITH GFR
ALT: 68 U/L — ABNORMAL HIGH (ref 0–44)
AST: 134 U/L — ABNORMAL HIGH (ref 15–41)
Albumin: 3.3 g/dL — ABNORMAL LOW (ref 3.5–5.0)
Alkaline Phosphatase: 37 U/L — ABNORMAL LOW (ref 38–126)
Anion gap: 17 — ABNORMAL HIGH (ref 5–15)
BUN: 15 mg/dL (ref 6–20)
CO2: 16 mmol/L — ABNORMAL LOW (ref 22–32)
Calcium: 9.7 mg/dL (ref 8.9–10.3)
Chloride: 103 mmol/L (ref 98–111)
Creatinine, Ser: 0.76 mg/dL (ref 0.44–1.00)
GFR, Estimated: >60 mL/min (ref >60)
Glucose, Bld: 78 mg/dL (ref 70–99)
Potassium: 3.2 mmol/L — ABNORMAL LOW (ref 3.5–5.1)
Sodium: 136 mmol/L (ref 135–145)
Total Bilirubin: 1.1 mg/dL (ref 0.0–1.2)
Total Protein: 7.6 g/dL (ref 6.5–8.1)

## 2023-07-12 LAB — GLUCOSE, POCT (MANUAL RESULT ENTRY): POC Glucose: 101 mg/dL — AB (ref 70–99)

## 2023-07-12 LAB — HCG, QUANTITATIVE, PREGNANCY: hCG, Beta Chain, Quant, S: <1 m[IU]/mL (ref <5)

## 2023-07-12 LAB — BETA HCG QUANT (REF LAB): hCG Quant: <1 m[IU]/mL

## 2023-07-12 LAB — HCG, SERUM, QUALITATIVE: Preg, Serum: NEGATIVE

## 2023-07-12 MED ORDER — SODIUM CHLORIDE 0.9% FLUSH: 10.0000 mL | INTRAVENOUS | Status: DC | PRN

## 2023-07-12 MED ORDER — POTASSIUM CHLORIDE CRYS ER 20 MEQ PO TBCR
40.0000 meq | EXTENDED_RELEASE_TABLET | Freq: Once | ORAL | Status: AC
  Administered 2023-07-12: 40 meq via ORAL
  Filled 2023-07-12: qty 2

## 2023-07-12 MED ORDER — SODIUM CHLORIDE 0.9% FLUSH
10.0000 mL | Freq: Two times a day (BID) | INTRAVENOUS | Status: DC
  Administered 2023-07-12: 10 mL

## 2023-07-12 MED ORDER — HEPARIN SOD (PORK) LOCK FLUSH 100 UNIT/ML IV SOLN
500.0000 [IU] | Freq: Once | INTRAVENOUS | Status: AC
  Administered 2023-07-12: 500 [IU]
  Filled 2023-07-12: qty 5

## 2023-07-12 MED ORDER — LACTATED RINGERS IV BOLUS
1000.0000 mL | Freq: Once | INTRAVENOUS | Status: AC
  Administered 2023-07-12: 1000 mL via INTRAVENOUS

## 2023-07-12 MED ORDER — CHLORHEXIDINE GLUCONATE CLOTH 2 % EX PADS: 6.0000 | MEDICATED_PAD | Freq: Every day | CUTANEOUS | Status: DC

## 2023-07-12 MED ORDER — OLMESARTAN-AMLODIPINE-HCTZ 20-5-12.5 MG PO TABS: 1.0000 | ORAL_TABLET | Freq: Every day | ORAL | 3 refills | Status: DC

## 2023-07-12 MED ORDER — ONDANSETRON HCL 4 MG/2ML IJ SOLN
4.0000 mg | Freq: Once | INTRAMUSCULAR | Status: AC
  Administered 2023-07-12: 4 mg via INTRAVENOUS
  Filled 2023-07-12: qty 2

## 2023-07-12 NOTE — ED Provider Notes (Signed)
 [place] EMERGENCY DEPARTMENT AT [facility] Provider Note   CSN: [id number] Arrival date & time: 07/12/23  1203     History  Chief Complaint  Patient presents with   Weakness    Tiffany Velasquez is a 28 y.o. female.   Weakness    Patient has a history of asthma morbid obesity hypertension glaucoma.  Patient was recently admitted to the hospital on April 11.  Patient was admitted for hypokalemia generalized weakness hypotension dehydration.  Patient's symptoms improved during treatment.  Her renal function also improved.  Patient was discharged home to follow-up with neurology as she had been having some trouble with lower extremity weakness and paresthesias..  Patient followed up in the family medicine office today.  Patient states she is still having trouble with nausea vomiting and her gait.  She has had some issues with falls and even fell initially on the way to the doctor's office today.  Patient states she still has not been able to keep food down.  At the end the doctor's office they noted she was still appearing weak and they recommended she come to the ED for evaluation  Home Medications Prior to Admission medications   Medication Sig Start Date End Date Taking? Authorizing Provider  acetaminophen (TYLENOL) 500 MG tablet Take 2 tablets (1,000 mg total) by mouth every 6 (six) hours as needed for mild pain (pain score 1-3) or moderate pain (pain score 4-6). 07/09/23   Tomie China, MD  acetaZOLAMIDE (DIAMOX) 250 MG tablet Take 250 mg by mouth 2 (two) times daily. 01/17/23   [provider]  Adalimumab 40 MG/0.4ML PNKT Inject 40 mg into the skin every 14 (fourteen) days. 03/03/20   [provider]  albuterol (PROVENTIL) (2.5 MG/3ML) 0.083% nebulizer solution Take 3 mLs (2.5 mg total) by nebulization every 6 (six) hours as needed for wheezing or shortness of breath. 04/21/23   Alfonse Spruce, MD  albuterol (VENTOLIN HFA) 108 (90 Base) MCG/ACT  inhaler Inhale 2 puffs every 4-6 hours as needed for cough, wheeze, tightness in chest, or shortness of breath 11/07/22   Ambs, Norvel Richards, FNP  allopurinol (ZYLOPRIM) 100 MG tablet Take 1 tablet (100 mg total) by mouth daily. 05/29/23   Lincoln Brigham, MD  azelastine (ASTELIN) 0.1 % nasal spray USE 2 SPRAYS IN EACH NOSTRIL TWICE DAILY AS NEEDED FOR RUNNY NOSE/DRAINAGE DOWN THROAT 11/23/22   Alfonse Spruce, MD  bimatoprost (LUMIGAN) 0.01 % SOLN Place 1 drop into both eyes at bedtime. 11/01/22   [provider]  brimonidine (ALPHAGAN) 0.2 % ophthalmic solution Place 1 drop into both eyes 3 (three) times daily.    [provider]  budesonide-formoterol (SYMBICORT) 160-4.5 MCG/ACT inhaler Inhale 2 puffs into the lungs in the morning and at bedtime. 11/23/22   Alfonse Spruce, MD  clindamycin (CLEOCIN T) 1 % lotion Apply topically 2 (two) times daily. 08/17/15   [provider]  colchicine 0.6 MG tablet Take 1 tablet (0.6 mg total) by mouth daily. 05/29/23   Lincoln Brigham, MD  dexlansoprazole (DEXILANT) 60 MG capsule Take 1 capsule (60 mg total) by mouth daily. 05/25/23   Doree Albee, PA-C  dorzolamide-timolol (COSOPT) 2-0.5 % ophthalmic solution Place 1 drop into both eyes 2 (two) times daily. 05/30/23   [provider]  DULoxetine (CYMBALTA) 30 MG capsule Take 1 capsule (30 mg total) by mouth daily. 07/10/23   Nita Sickle K, DO  famotidine (PEPCID) 40 MG tablet Take 1 tablet (  40 mg total) by mouth at bedtime. 05/25/23   Doree Albee, PA-C  fluticasone (FLONASE) 50 MCG/ACT nasal spray Place 1 spray into both nostrils daily. 11/07/22   Hetty Blend, FNP  gabapentin (NEURONTIN) 300 MG capsule Start at 300 p.o. 3 times daily.  If tolerated work up to 600 mg p.o. 3 times daily Patient taking differently: Take 600 mg by mouth 3 (three) times daily. 06/22/23   Persons, [place], [place]  GAMUNEX-C 20 GM/200ML SOLN  05/20/22   [provider]  loratadine (CLARITIN) 10  MG tablet Take 1 tablet (10 mg total) by mouth daily. 03/08/23   Alfredo Martinez, MD  metoCLOPramide (REGLAN) 10 MG tablet Take 1 tablet (10 mg total) by mouth every 8 (eight) hours as needed for nausea. 04/17/23   Gloris Manchester, MD  mometasone (ELOCON) 0.1 % cream Apply 1 Application topically 2 (two) times daily. Patient taking differently: Apply 1 Application topically 2 (two) times daily as needed. 11/23/22   Alfonse Spruce, MD  montelukast (SINGULAIR) 10 MG tablet Take 1 tablet (10 mg total) by mouth at bedtime. 11/23/22   Alfonse Spruce, MD  mycophenolate (CELLCEPT) 500 MG tablet Take 500 mg by mouth 2 (two) times daily.    [provider]  Olmesartan-amLODIPine-HCTZ 20-5-12.5 MG TABS Take 1 tablet by mouth daily. 07/12/23   Jerre Simon, MD  ondansetron (ZOFRAN) 8 MG tablet Take 1 tablet (8 mg total) by mouth every 8 (eight) hours as needed for nausea or vomiting. 05/25/23   Doree Albee, PA-C  polyethylene glycol powder (GLYCOLAX/MIRALAX) 17 GM/SCOOP powder Take 17 g by mouth daily. Patient taking differently: Take 17 g by mouth daily as needed for mild constipation. 05/10/23   Bess Kinds, MD  potassium chloride (KLOR-CON) 20 MEQ packet Take 20 mEq by mouth daily. 07/10/23   Tomie China, MD  RETIN-A MICRO 0.04 % gel Apply 1 application  topically daily as needed. 06/28/21   [provider]  RHOPRESSA 0.02 % SOLN Place 1 drop into the right eye daily.    [provider]  sucralfate (CARAFATE) 1 GM/10ML suspension Take 10 mLs (1 g total) by mouth 4 (four) times daily -  with meals and at bedtime. 05/25/23   Doree Albee, PA-C  vitamin B-12 (VITAMIN B12) 500 MCG tablet Take 1 tablet (500 mcg total) by mouth daily. 07/10/23   Tomie China, MD      Allergies    Other, Peanut-containing drug products, Shellfish allergy, Apple juice, Orange fruit [citrus], Peach flavoring agent (non-screening), Tomato, and Tree extract    Review of Systems    Review of Systems  Neurological:  Positive for weakness.    Physical Exam Updated Vital Signs BP (!) 125/93   Pulse (!) 115   Temp 98.3 F (36.8 C) (Oral)   Resp 18   SpO2 100%  Physical Exam Vitals and nursing note reviewed.  Constitutional:      Appearance: She is well-developed. She is not diaphoretic.  HENT:     Head: Normocephalic and atraumatic.     Comments: Mucous membranes dry    Right Ear: External ear normal.     Left Ear: External ear normal.  Eyes:     General: No scleral icterus.       Right eye: No discharge.        Left eye: No discharge.     Conjunctiva/sclera: Conjunctivae normal.  Neck:     Trachea: No tracheal deviation.  Cardiovascular:  Rate and Rhythm: Regular rhythm. Tachycardia present.  Pulmonary:     Effort: Pulmonary effort is normal. No respiratory distress.     Breath sounds: Normal breath sounds. No stridor. No wheezing or rales.  Abdominal:     General: Bowel sounds are normal. There is no distension.     Palpations: Abdomen is soft.     Tenderness: There is no abdominal tenderness. There is no guarding or rebound.  Musculoskeletal:        General: No tenderness or deformity.     Cervical back: Neck supple.  Skin:    General: Skin is warm and dry.     Findings: No rash.  Neurological:     General: No focal deficit present.     Mental Status: She is alert.     Cranial Nerves: No cranial nerve deficit, dysarthria or facial asymmetry.     Sensory: No sensory deficit.     Motor: No abnormal muscle tone or seizure activity.     Coordination: Coordination normal.  Psychiatric:        Mood and Affect: Mood normal.     ED Results / Procedures / Treatments   Labs (all labs ordered are listed, but only abnormal results are displayed) Labs Reviewed  COMPREHENSIVE METABOLIC PANEL WITH GFR  LIPASE, BLOOD  CBC WITH DIFFERENTIAL/PLATELET  URINALYSIS, ROUTINE W REFLEX MICROSCOPIC  HCG, SERUM, QUALITATIVE     EKG None  Radiology No results found.  Procedures Procedures    Medications Ordered in ED Medications  lactated ringers bolus 1,000 mL (has no administration in time range)  ondansetron (ZOFRAN) injection 4 mg (has no administration in time range)    ED Course/ Medical Decision Making/ A&P                                 Medical Decision Making Amount and/or Complexity of Data Reviewed Labs: ordered.  Risk Prescription drug management.   Patient presented to the ED for evaluation of nausea vomiting weakness.  Patient recently had been in the hospital for dehydration and AKI associated with nausea and vomiting.  Laboratory tests including CBC and metabolic panel lipase pregnancy test ordered.  IV fluids and antiemetics were also ordered. Care turned over to Dr Daivd Dub at shift change.        Final Clinical Impression(s) / ED Diagnoses Final diagnoses:  None    Rx / DC Orders ED Discharge Orders     None         Trish Furl, MD 07/12/23 [phone]

## 2023-07-12 NOTE — Patient Instructions (Signed)
 Pleasure to meet you today  Blood pressure today was elevated and I have restarted you on your home blood pressure medication.  Please make sure you are taking these medications as prescribed.  Check your blood pressures daily keep a log and follow-up with your PCP in 1 week.  Today I ordered labs to check your electrolytes level and your kidney function.  Please make sure you are taking your Zofran 30 minutes to 1 hour before eating or taking your other medications.  You are not having urination at least 3 times in 24 hours and still having significant vomiting please go to the ED for IV fluid.  For your numbness in your legs glad to see that you are following up with neurology

## 2023-07-12 NOTE — ED Provider Notes (Signed)
 F/u labs and hydration.  Physical Exam  BP (!) 125/93   Pulse (!) 115   Temp 98.3 F (36.8 C) (Oral)   Resp 18   SpO2 100%   Physical Exam  Procedures  Procedures  ED Course / MDM    Medical Decision Making Amount and/or Complexity of Data Reviewed Labs: ordered.  Risk OTC drugs. Prescription drug management.   PCP is Family Medicine.  Patient reports that she feels improved after hydration.  She feels ready to have something to eat and drink.  Labs show potassium of 3.2.  Patient does have anion gap of 17.  Minor elevation in LFTs seen previously during hospitalization.  Hemoglobin is stable.  No leukocytosis.  Lipase mildly elevated at 68.  I have reviewed neurology's note.  She had consultation on 4/14 with recommendations for care.  Including additional vitamin B12 therapy.  Patient is currently being treated with immunosuppressants.  Patient does not appear toxic or to have acute infectious illness.  At this time stable for discharge.  Patient feels improved and is alert and tolerating oral intake.  She will require close outpatient monitoring for electrolyte status and ongoing surveillance by neurology and her primary providers in the setting of peripheral neuropathy and immunosuppression.     Wynetta Heckle, MD 07/12/23 [phone]

## 2023-07-12 NOTE — ED Triage Notes (Signed)
 Patient BIB [facility]link from primary care for N/V weakness.  151/92 109HR 100% RA 18 RR

## 2023-07-12 NOTE — Progress Notes (Cosign Needed Addendum)
    SUBJECTIVE:   CHIEF COMPLAINT / HPI:   28 year old  female with a history of hypertension and neurological symptoms, presents for hospital follow up with ongoing leg weakness and numbness, which has been present for approximately two to three weeks. The patient describes the sensation in her feet as feeling like "cement," with occasional shooting pain through her toes. This has led to difficulty walking and several falls, including one earlier in the day. The patient also reports nausea and vomiting, and has been unable to keep food down. The patient has been taking Zofran for nausea as needed, approximately every eight hours. The patient has recently seen a neurologist, who started her on Cymbalta and B12 supplements. The patient is also scheduled for a nerve conduction study.   PERTINENT  PMH / PSH: Reviewed   OBJECTIVE:   BP (!) 143/118   Pulse (!) 118   Ht 5\' 9"  (1.753 m)   SpO2 98%   BMI 47.99 kg/m    Physical Exam General: Alert, morbidly obese, NAD, sitting on wheelchair Cardiovascular: RRR, No Murmurs, Normal S2/S2 Respiratory: CTAB, No wheezing or Rales Abdomen: No distension or tenderness Extremities: No edema on extremities , global bilateral foot tenderness with mild palpation.  unable to test strength   ASSESSMENT/PLAN:    Leg Weakness and Numbness Experiencing leg weakness and numbness for two to three weeks with multiple falls. Neurologist initiated Cymbalta and B12; gabapentin ongoing. Most labs negative. Nerve conduction study planned. - Proceed with nerve conduction study as planned by neurologist. - Continue Cymbalta and gabapentin as prescribed. - Follow up with neurologist after nerve conduction study.  Hypertension Blood pressure elevated; antihypertensive medication held during recent hospitalization. Plan to restart home medications and monitor closely. - Restart home antihypertensive medications. - Monitor blood pressure daily and record.  Nausea  and Vomiting Experiencing nausea and vomiting, affecting oral intake. Ondansetron taken as needed. Hydration and nutrition are concerns. Patient at the end of the visit appeared very weak. Recommend patient goes to ED for acute evaluation and management. Suspect need for fluid resuscitation given poor po intake, emesis, tachycardia and decrease UOP. Could also benefit from imaging.    Goble Last, MD [facility]

## 2023-07-12 NOTE — Discharge Instructions (Signed)
 1.  Continue to take your potassium supplements.  Your potassium was only mildly low in the emergency department but you need to continue supplements. 2.  Continue to hydrate with liquids containing electrolytes.  This includes rehydration solutions like Pedialyte, soups with a lot of broth, dilute juices.  Continue to drink water but drinking too much water can make your sodium low.  Review the instructions in your discharge paperwork for rehydration. 3.  Is very important that you continue to follow-up closely with your primary doctor and your neurologist to monitor your symptoms and response to treatment.  Return to the emergency department if you feel that your symptoms are worsening.

## 2023-07-12 NOTE — ED Notes (Signed)
 Patient discharged in stable condition.

## 2023-07-13 ENCOUNTER — Ambulatory Visit

## 2023-07-13 ENCOUNTER — Telehealth: Payer: Self-pay | Admitting: Neurology

## 2023-07-13 DIAGNOSIS — M79671 Pain in right foot: Secondary | ICD-10-CM

## 2023-07-13 DIAGNOSIS — G629 Polyneuropathy, unspecified: Secondary | ICD-10-CM

## 2023-07-13 DIAGNOSIS — R202 Paresthesia of skin: Secondary | ICD-10-CM

## 2023-07-13 LAB — CBC
Hematocrit: 38.6 % (ref 34.0–46.6)
Hemoglobin: 12.2 g/dL (ref 11.1–15.9)
MCH: 29.1 pg (ref 26.6–33.0)
MCHC: 31.6 g/dL (ref 31.5–35.7)
MCV: 92 fL (ref 79–97)
Platelets: 189 10*3/uL (ref 150–450)
RBC: 4.19 x10E6/uL (ref 3.77–5.28)
RDW: 24.5 % — ABNORMAL HIGH (ref 11.7–15.4)
WBC: 3.8 10*3/uL (ref 3.4–10.8)

## 2023-07-13 NOTE — Telephone Encounter (Signed)
 OK to send home PT for leg strengthening.

## 2023-07-13 NOTE — Telephone Encounter (Signed)
Referral sent.  Patient notified via Mychart.

## 2023-07-13 NOTE — Telephone Encounter (Signed)
 Pt mother called to see if Dr.Patel can send a referral for PT to come to their home instead of her going to an office. She is not able to take her to PT. She keeps falling, she ended up in the ER yesterday.

## 2023-07-14 ENCOUNTER — Encounter (HOSPITAL_COMMUNITY): Payer: Self-pay

## 2023-07-14 ENCOUNTER — Telehealth: Payer: Self-pay | Admitting: Family Medicine

## 2023-07-14 ENCOUNTER — Inpatient Hospital Stay (HOSPITAL_COMMUNITY)

## 2023-07-14 ENCOUNTER — Inpatient Hospital Stay (HOSPITAL_COMMUNITY)
Admission: EM | Admit: 2023-07-14 | Discharge: 2023-07-20 | DRG: 555 | Disposition: A | Discharge status: Rehabilitation Facility | Attending: Family Medicine | Admitting: Family Medicine

## 2023-07-14 DIAGNOSIS — E538 Deficiency of other specified B group vitamins: Secondary | ICD-10-CM | POA: Diagnosis not present

## 2023-07-14 DIAGNOSIS — H44113 Panuveitis, bilateral: Secondary | ICD-10-CM | POA: Diagnosis present

## 2023-07-14 DIAGNOSIS — G63 Polyneuropathy in diseases classified elsewhere: Secondary | ICD-10-CM

## 2023-07-14 DIAGNOSIS — R7401 Elevation of levels of liver transaminase levels: Secondary | ICD-10-CM | POA: Diagnosis present

## 2023-07-14 DIAGNOSIS — R601 Generalized edema: Secondary | ICD-10-CM | POA: Diagnosis not present

## 2023-07-14 DIAGNOSIS — R339 Retention of urine, unspecified: Secondary | ICD-10-CM | POA: Diagnosis present

## 2023-07-14 DIAGNOSIS — M60852 Other myositis, left thigh: Principal | ICD-10-CM | POA: Diagnosis present

## 2023-07-14 DIAGNOSIS — D62 Acute posthemorrhagic anemia: Secondary | ICD-10-CM | POA: Diagnosis not present

## 2023-07-14 DIAGNOSIS — I071 Rheumatic tricuspid insufficiency: Secondary | ICD-10-CM | POA: Diagnosis present

## 2023-07-14 DIAGNOSIS — Z79891 Long term (current) use of opiate analgesic: Secondary | ICD-10-CM

## 2023-07-14 DIAGNOSIS — K760 Fatty (change of) liver, not elsewhere classified: Secondary | ICD-10-CM | POA: Diagnosis present

## 2023-07-14 DIAGNOSIS — Z79624 Long term (current) use of inhibitors of nucleotide synthesis: Secondary | ICD-10-CM

## 2023-07-14 DIAGNOSIS — R1114 Bilious vomiting: Secondary | ICD-10-CM | POA: Diagnosis not present

## 2023-07-14 DIAGNOSIS — E871 Hypo-osmolality and hyponatremia: Secondary | ICD-10-CM | POA: Diagnosis not present

## 2023-07-14 DIAGNOSIS — R7982 Elevated C-reactive protein (CRP): Secondary | ICD-10-CM | POA: Diagnosis present

## 2023-07-14 DIAGNOSIS — J690 Pneumonitis due to inhalation of food and vomit: Secondary | ICD-10-CM | POA: Diagnosis present

## 2023-07-14 DIAGNOSIS — I10 Essential (primary) hypertension: Secondary | ICD-10-CM | POA: Diagnosis present

## 2023-07-14 DIAGNOSIS — M792 Neuralgia and neuritis, unspecified: Secondary | ICD-10-CM | POA: Diagnosis not present

## 2023-07-14 DIAGNOSIS — E876 Hypokalemia: Secondary | ICD-10-CM | POA: Diagnosis not present

## 2023-07-14 DIAGNOSIS — D84821 Immunodeficiency due to drugs: Secondary | ICD-10-CM | POA: Diagnosis present

## 2023-07-14 DIAGNOSIS — R112 Nausea with vomiting, unspecified: Secondary | ICD-10-CM | POA: Diagnosis present

## 2023-07-14 DIAGNOSIS — M60862 Other myositis, left lower leg: Secondary | ICD-10-CM | POA: Diagnosis present

## 2023-07-14 DIAGNOSIS — M109 Gout, unspecified: Secondary | ICD-10-CM | POA: Diagnosis present

## 2023-07-14 DIAGNOSIS — R700 Elevated erythrocyte sedimentation rate: Secondary | ICD-10-CM | POA: Diagnosis not present

## 2023-07-14 DIAGNOSIS — R29898 Other symptoms and signs involving the musculoskeletal system: Secondary | ICD-10-CM | POA: Diagnosis present

## 2023-07-14 DIAGNOSIS — Z5181 Encounter for therapeutic drug level monitoring: Secondary | ICD-10-CM

## 2023-07-14 DIAGNOSIS — R300 Dysuria: Secondary | ICD-10-CM | POA: Diagnosis not present

## 2023-07-14 DIAGNOSIS — E5111 Dry beriberi: Secondary | ICD-10-CM | POA: Diagnosis not present

## 2023-07-14 DIAGNOSIS — Z91018 Allergy to other foods: Secondary | ICD-10-CM

## 2023-07-14 DIAGNOSIS — R7989 Other specified abnormal findings of blood chemistry: Secondary | ICD-10-CM

## 2023-07-14 DIAGNOSIS — E539 Vitamin B deficiency, unspecified: Secondary | ICD-10-CM | POA: Diagnosis present

## 2023-07-14 DIAGNOSIS — Z91010 Allergy to peanuts: Secondary | ICD-10-CM | POA: Diagnosis present

## 2023-07-14 DIAGNOSIS — J90 Pleural effusion, not elsewhere classified: Secondary | ICD-10-CM | POA: Diagnosis not present

## 2023-07-14 DIAGNOSIS — G8929 Other chronic pain: Secondary | ICD-10-CM | POA: Diagnosis present

## 2023-07-14 DIAGNOSIS — J189 Pneumonia, unspecified organism: Secondary | ICD-10-CM

## 2023-07-14 DIAGNOSIS — M79606 Pain in leg, unspecified: Secondary | ICD-10-CM | POA: Diagnosis not present

## 2023-07-14 DIAGNOSIS — J45909 Unspecified asthma, uncomplicated: Secondary | ICD-10-CM | POA: Diagnosis present

## 2023-07-14 DIAGNOSIS — F419 Anxiety disorder, unspecified: Secondary | ICD-10-CM | POA: Diagnosis not present

## 2023-07-14 DIAGNOSIS — Z6841 Body Mass Index (BMI) 40.0 and over, adult: Secondary | ICD-10-CM

## 2023-07-14 DIAGNOSIS — Z7409 Other reduced mobility: Secondary | ICD-10-CM | POA: Diagnosis present

## 2023-07-14 DIAGNOSIS — Q998 Other specified chromosome abnormalities: Secondary | ICD-10-CM | POA: Diagnosis not present

## 2023-07-14 DIAGNOSIS — F39 Unspecified mood [affective] disorder: Secondary | ICD-10-CM | POA: Diagnosis not present

## 2023-07-14 DIAGNOSIS — Z79899 Other long term (current) drug therapy: Secondary | ICD-10-CM

## 2023-07-14 DIAGNOSIS — G629 Polyneuropathy, unspecified: Secondary | ICD-10-CM | POA: Diagnosis not present

## 2023-07-14 DIAGNOSIS — K838 Other specified diseases of biliary tract: Secondary | ICD-10-CM | POA: Diagnosis not present

## 2023-07-14 DIAGNOSIS — E519 Thiamine deficiency, unspecified: Secondary | ICD-10-CM | POA: Diagnosis present

## 2023-07-14 DIAGNOSIS — M6282 Rhabdomyolysis: Secondary | ICD-10-CM | POA: Diagnosis present

## 2023-07-14 DIAGNOSIS — Z825 Family history of asthma and other chronic lower respiratory diseases: Secondary | ICD-10-CM

## 2023-07-14 DIAGNOSIS — E889 Metabolic disorder, unspecified: Secondary | ICD-10-CM | POA: Diagnosis present

## 2023-07-14 DIAGNOSIS — H3093 Unspecified chorioretinal inflammation, bilateral: Secondary | ICD-10-CM | POA: Diagnosis present

## 2023-07-14 DIAGNOSIS — R918 Other nonspecific abnormal finding of lung field: Secondary | ICD-10-CM | POA: Diagnosis not present

## 2023-07-14 DIAGNOSIS — R000 Tachycardia, unspecified: Secondary | ICD-10-CM

## 2023-07-14 DIAGNOSIS — D801 Nonfamilial hypogammaglobulinemia: Secondary | ICD-10-CM | POA: Diagnosis present

## 2023-07-14 DIAGNOSIS — R531 Weakness: Secondary | ICD-10-CM | POA: Diagnosis present

## 2023-07-14 DIAGNOSIS — K5900 Constipation, unspecified: Secondary | ICD-10-CM | POA: Diagnosis not present

## 2023-07-14 DIAGNOSIS — K909 Intestinal malabsorption, unspecified: Secondary | ICD-10-CM | POA: Diagnosis present

## 2023-07-14 DIAGNOSIS — M6088 Other myositis, other site: Secondary | ICD-10-CM | POA: Diagnosis not present

## 2023-07-14 DIAGNOSIS — K5901 Slow transit constipation: Secondary | ICD-10-CM | POA: Diagnosis not present

## 2023-07-14 DIAGNOSIS — N390 Urinary tract infection, site not specified: Secondary | ICD-10-CM | POA: Diagnosis not present

## 2023-07-14 DIAGNOSIS — Z789 Other specified health status: Secondary | ICD-10-CM

## 2023-07-14 DIAGNOSIS — M609 Myositis, unspecified: Secondary | ICD-10-CM | POA: Diagnosis not present

## 2023-07-14 DIAGNOSIS — E860 Dehydration: Principal | ICD-10-CM | POA: Diagnosis present

## 2023-07-14 DIAGNOSIS — T380X5A Adverse effect of glucocorticoids and synthetic analogues, initial encounter: Secondary | ICD-10-CM | POA: Diagnosis present

## 2023-07-14 DIAGNOSIS — R634 Abnormal weight loss: Secondary | ICD-10-CM | POA: Diagnosis present

## 2023-07-14 DIAGNOSIS — G9600 Cerebrospinal fluid leak, unspecified: Secondary | ICD-10-CM | POA: Diagnosis not present

## 2023-07-14 DIAGNOSIS — H4060X0 Glaucoma secondary to drugs, unspecified eye, stage unspecified: Secondary | ICD-10-CM | POA: Diagnosis not present

## 2023-07-14 DIAGNOSIS — R195 Other fecal abnormalities: Secondary | ICD-10-CM | POA: Diagnosis not present

## 2023-07-14 DIAGNOSIS — R600 Localized edema: Secondary | ICD-10-CM | POA: Diagnosis not present

## 2023-07-14 DIAGNOSIS — E559 Vitamin D deficiency, unspecified: Secondary | ICD-10-CM

## 2023-07-14 DIAGNOSIS — R296 Repeated falls: Secondary | ICD-10-CM | POA: Diagnosis present

## 2023-07-14 DIAGNOSIS — D649 Anemia, unspecified: Secondary | ICD-10-CM | POA: Diagnosis present

## 2023-07-14 DIAGNOSIS — M51379 Other intervertebral disc degeneration, lumbosacral region without mention of lumbar back pain or lower extremity pain: Secondary | ICD-10-CM | POA: Diagnosis not present

## 2023-07-14 DIAGNOSIS — I5032 Chronic diastolic (congestive) heart failure: Secondary | ICD-10-CM | POA: Diagnosis not present

## 2023-07-14 DIAGNOSIS — R748 Abnormal levels of other serum enzymes: Secondary | ICD-10-CM | POA: Diagnosis not present

## 2023-07-14 DIAGNOSIS — D638 Anemia in other chronic diseases classified elsewhere: Secondary | ICD-10-CM | POA: Diagnosis present

## 2023-07-14 DIAGNOSIS — M79672 Pain in left foot: Secondary | ICD-10-CM | POA: Diagnosis present

## 2023-07-14 DIAGNOSIS — M60859 Other myositis, unspecified thigh: Secondary | ICD-10-CM | POA: Diagnosis not present

## 2023-07-14 DIAGNOSIS — M79671 Pain in right foot: Secondary | ICD-10-CM | POA: Diagnosis present

## 2023-07-14 DIAGNOSIS — E872 Acidosis, unspecified: Secondary | ICD-10-CM | POA: Diagnosis not present

## 2023-07-14 DIAGNOSIS — T781XXA Other adverse food reactions, not elsewhere classified, initial encounter: Secondary | ICD-10-CM | POA: Diagnosis present

## 2023-07-14 DIAGNOSIS — E509 Vitamin A deficiency, unspecified: Secondary | ICD-10-CM | POA: Diagnosis present

## 2023-07-14 DIAGNOSIS — I110 Hypertensive heart disease with heart failure: Secondary | ICD-10-CM | POA: Diagnosis not present

## 2023-07-14 DIAGNOSIS — T502X5A Adverse effect of carbonic-anhydrase inhibitors, benzothiadiazides and other diuretics, initial encounter: Secondary | ICD-10-CM | POA: Diagnosis present

## 2023-07-14 DIAGNOSIS — F4323 Adjustment disorder with mixed anxiety and depressed mood: Secondary | ICD-10-CM | POA: Diagnosis not present

## 2023-07-14 DIAGNOSIS — R1110 Vomiting, unspecified: Secondary | ICD-10-CM | POA: Diagnosis present

## 2023-07-14 DIAGNOSIS — E569 Vitamin deficiency, unspecified: Secondary | ICD-10-CM | POA: Diagnosis not present

## 2023-07-14 DIAGNOSIS — R6889 Other general symptoms and signs: Secondary | ICD-10-CM | POA: Diagnosis present

## 2023-07-14 DIAGNOSIS — K2950 Unspecified chronic gastritis without bleeding: Secondary | ICD-10-CM | POA: Diagnosis present

## 2023-07-14 DIAGNOSIS — Z7951 Long term (current) use of inhaled steroids: Secondary | ICD-10-CM

## 2023-07-14 DIAGNOSIS — R5381 Other malaise: Secondary | ICD-10-CM | POA: Diagnosis not present

## 2023-07-14 DIAGNOSIS — Z95828 Presence of other vascular implants and grafts: Secondary | ICD-10-CM

## 2023-07-14 DIAGNOSIS — Z91013 Allergy to seafood: Secondary | ICD-10-CM

## 2023-07-14 HISTORY — DX: Tachycardia, unspecified: R00.0

## 2023-07-14 LAB — CBC WITH DIFFERENTIAL/PLATELET
Abs Immature Granulocytes: 0.15 10*3/uL — ABNORMAL HIGH (ref 0.00–0.07)
Basophils Absolute: 0 10*3/uL (ref 0.0–0.1)
Basophils Relative: 0 %
Eosinophils Absolute: 0 10*3/uL (ref 0.0–0.5)
Eosinophils Relative: 0 %
HCT: 29.8 % — ABNORMAL LOW (ref 36.0–46.0)
Hemoglobin: 9.5 g/dL — ABNORMAL LOW (ref 12.0–15.0)
Immature Granulocytes: 1 %
Lymphocytes Relative: 9 %
Lymphs Abs: 1.2 10*3/uL (ref 0.7–4.0)
MCH: 29.5 pg (ref 26.0–34.0)
MCHC: 31.9 g/dL (ref 30.0–36.0)
MCV: 92.5 fL (ref 80.0–100.0)
Monocytes Absolute: 0.9 10*3/uL (ref 0.1–1.0)
Monocytes Relative: 7 %
Neutro Abs: 11 10*3/uL — ABNORMAL HIGH (ref 1.7–7.7)
Neutrophils Relative %: 83 %
Platelets: 171 10*3/uL (ref 150–400)
RBC: 3.22 MIL/uL — ABNORMAL LOW (ref 3.87–5.11)
RDW: 23.9 % — ABNORMAL HIGH (ref 11.5–15.5)
WBC: 13.3 10*3/uL — ABNORMAL HIGH (ref 4.0–10.5)
nRBC: 0.4 % — ABNORMAL HIGH (ref 0.0–0.2)

## 2023-07-14 LAB — I-STAT VENOUS BLOOD GAS, ED
Acid-base deficit: 9 mmol/L — ABNORMAL HIGH (ref 0.0–2.0)
Bicarbonate: 14.2 mmol/L — ABNORMAL LOW (ref 20.0–28.0)
Calcium, Ion: 1.14 mmol/L — ABNORMAL LOW (ref 1.15–1.40)
HCT: 30 % — ABNORMAL LOW (ref 36.0–46.0)
Hemoglobin: 10.2 g/dL — ABNORMAL LOW (ref 12.0–15.0)
O2 Saturation: 99 %
Potassium: 5.3 mmol/L — ABNORMAL HIGH (ref 3.5–5.1)
Sodium: 137 mmol/L (ref 135–145)
TCO2: 15 mmol/L — ABNORMAL LOW (ref 22–32)
pCO2, Ven: 21.9 mmHg — ABNORMAL LOW (ref 44–60)
pH, Ven: 7.421 (ref 7.25–7.43)
pO2, Ven: 151 mmHg — ABNORMAL HIGH (ref 32–45)

## 2023-07-14 LAB — COMPREHENSIVE METABOLIC PANEL WITH GFR
ALT: 67 U/L — ABNORMAL HIGH (ref 0–44)
AST: 208 U/L — ABNORMAL HIGH (ref 15–41)
Albumin: 3.1 g/dL — ABNORMAL LOW (ref 3.5–5.0)
Alkaline Phosphatase: 40 U/L (ref 38–126)
Anion gap: 20 — ABNORMAL HIGH (ref 5–15)
BUN: 14 mg/dL (ref 6–20)
CO2: 13 mmol/L — ABNORMAL LOW (ref 22–32)
Calcium: 9.6 mg/dL (ref 8.9–10.3)
Chloride: 104 mmol/L (ref 98–111)
Creatinine, Ser: 0.94 mg/dL (ref 0.44–1.00)
GFR, Estimated: >60 mL/min (ref >60)
Glucose, Bld: 79 mg/dL (ref 70–99)
Potassium: 3.4 mmol/L — ABNORMAL LOW (ref 3.5–5.1)
Sodium: 137 mmol/L (ref 135–145)
Total Bilirubin: 1.8 mg/dL — ABNORMAL HIGH (ref 0.0–1.2)
Total Protein: 7.4 g/dL (ref 6.5–8.1)

## 2023-07-14 LAB — MAGNESIUM: Magnesium: 2.2 mg/dL (ref 1.7–2.4)

## 2023-07-14 LAB — SEDIMENTATION RATE: Sed Rate: 117 mm/h — ABNORMAL HIGH (ref 0–22)

## 2023-07-14 LAB — C-REACTIVE PROTEIN: CRP: 27.2 mg/dL — ABNORMAL HIGH (ref <1.0)

## 2023-07-14 LAB — SALICYLATE LEVEL: Salicylate Lvl: <7 mg/dL — ABNORMAL LOW (ref 7.0–30.0)

## 2023-07-14 LAB — CK: Total CK: 1018 U/L — ABNORMAL HIGH (ref 38–234)

## 2023-07-14 MED ORDER — PANTOPRAZOLE SODIUM 40 MG PO TBEC
40.0000 mg | DELAYED_RELEASE_TABLET | Freq: Every day | ORAL | Status: DC
  Filled 2023-07-14: qty 1

## 2023-07-14 MED ORDER — ENOXAPARIN SODIUM 80 MG/0.8ML IJ SOSY
70.0000 mg | PREFILLED_SYRINGE | INTRAMUSCULAR | Status: DC
  Administered 2023-07-15 – 2023-07-20 (×6): 70 mg via SUBCUTANEOUS
  Filled 2023-07-14 (×6): qty 0.7

## 2023-07-14 MED ORDER — LATANOPROST 0.005 % OP SOLN
1.0000 [drp] | Freq: Every day | OPHTHALMIC | Status: DC
  Administered 2023-07-15 – 2023-07-20 (×5): 1 [drp] via OPHTHALMIC
  Filled 2023-07-14: qty 2.5

## 2023-07-14 MED ORDER — CHLORHEXIDINE GLUCONATE CLOTH 2 % EX PADS
6.0000 | MEDICATED_PAD | Freq: Every day | CUTANEOUS | Status: DC
Start: 2023-07-15 — End: 2023-07-20
  Administered 2023-07-15 – 2023-07-20 (×6): 6 via TOPICAL

## 2023-07-14 MED ORDER — FAMOTIDINE 20 MG PO TABS
40.0000 mg | ORAL_TABLET | Freq: Every day | ORAL | Status: DC
  Administered 2023-07-15 – 2023-07-19 (×5): 40 mg via ORAL
  Filled 2023-07-14 (×5): qty 2

## 2023-07-14 MED ORDER — MOMETASONE FURO-FORMOTEROL FUM 200-5 MCG/ACT IN AERO
2.0000 | INHALATION_SPRAY | Freq: Two times a day (BID) | RESPIRATORY_TRACT | Status: DC
  Administered 2023-07-15 – 2023-07-20 (×10): 2 via RESPIRATORY_TRACT
  Filled 2023-07-14: qty 8.8

## 2023-07-14 MED ORDER — VITAMIN B-12 100 MCG PO TABS
500.0000 ug | ORAL_TABLET | Freq: Every day | ORAL | Status: DC
  Administered 2023-07-15: 500 ug via ORAL
  Filled 2023-07-14: qty 5

## 2023-07-14 MED ORDER — NETARSUDIL DIMESYLATE 0.02 % OP SOLN
1.0000 [drp] | Freq: Every day | OPHTHALMIC | Status: DC
Start: 2023-07-15 — End: 2023-07-15

## 2023-07-14 MED ORDER — AZELASTINE HCL 0.1 % NA SOLN
2.0000 | Freq: Two times a day (BID) | NASAL | Status: DC
  Administered 2023-07-15 – 2023-07-20 (×8): 2 via NASAL
  Filled 2023-07-14: qty 30

## 2023-07-14 MED ORDER — SODIUM CHLORIDE 0.9% FLUSH: 10.0000 mL | INTRAVENOUS | Status: DC | PRN

## 2023-07-14 MED ORDER — KETOROLAC TROMETHAMINE 15 MG/ML IJ SOLN
15.0000 mg | Freq: Once | INTRAMUSCULAR | Status: AC
  Administered 2023-07-14: 15 mg via INTRAVENOUS
  Filled 2023-07-14: qty 1

## 2023-07-14 MED ORDER — LORAZEPAM 2 MG/ML IJ SOLN
1.0000 mg | Freq: Once | INTRAMUSCULAR | Status: AC
  Administered 2023-07-14: 1 mg via INTRAVENOUS
  Filled 2023-07-14: qty 1

## 2023-07-14 MED ORDER — GABAPENTIN 300 MG PO CAPS
600.0000 mg | ORAL_CAPSULE | Freq: Three times a day (TID) | ORAL | Status: DC
  Administered 2023-07-15 – 2023-07-20 (×16): 600 mg via ORAL
  Filled 2023-07-14 (×16): qty 2

## 2023-07-14 MED ORDER — LACTATED RINGERS IV BOLUS
1000.0000 mL | Freq: Once | INTRAVENOUS | Status: AC
  Administered 2023-07-14: 1000 mL via INTRAVENOUS

## 2023-07-14 MED ORDER — ALBUTEROL SULFATE (2.5 MG/3ML) 0.083% IN NEBU: 2.5000 mg | INHALATION_SOLUTION | Freq: Four times a day (QID) | RESPIRATORY_TRACT | Status: DC | PRN

## 2023-07-14 MED ORDER — TRETINOIN MICROSPHERE 0.04 % EX GEL: 1.0000 | Freq: Every day | CUTANEOUS | Status: DC | PRN

## 2023-07-14 MED ORDER — COLCHICINE 0.6 MG PO TABS
0.6000 mg | ORAL_TABLET | Freq: Every day | ORAL | Status: DC
  Filled 2023-07-14: qty 1

## 2023-07-14 MED ORDER — ALLOPURINOL 100 MG PO TABS
100.0000 mg | ORAL_TABLET | Freq: Every day | ORAL | Status: DC
  Administered 2023-07-15 – 2023-07-20 (×6): 100 mg via ORAL
  Filled 2023-07-14 (×6): qty 1

## 2023-07-14 MED ORDER — DULOXETINE HCL 30 MG PO CPEP
30.0000 mg | ORAL_CAPSULE | Freq: Every day | ORAL | Status: DC
  Administered 2023-07-15 – 2023-07-16 (×2): 30 mg via ORAL
  Filled 2023-07-14 (×2): qty 1

## 2023-07-14 MED ORDER — LACTATED RINGERS IV SOLN: INTRAVENOUS | Status: AC

## 2023-07-14 MED ORDER — SODIUM BICARBONATE 8.4 % IV SOLN
50.0000 meq | Freq: Once | INTRAVENOUS | Status: AC
  Administered 2023-07-14: 50 meq via INTRAVENOUS
  Filled 2023-07-14: qty 50

## 2023-07-14 MED ORDER — DORZOLAMIDE HCL-TIMOLOL MAL 2-0.5 % OP SOLN
1.0000 [drp] | Freq: Two times a day (BID) | OPHTHALMIC | Status: DC
  Administered 2023-07-15 – 2023-07-20 (×11): 1 [drp] via OPHTHALMIC
  Filled 2023-07-14: qty 10

## 2023-07-14 MED ORDER — ACETAZOLAMIDE 250 MG PO TABS
500.0000 mg | ORAL_TABLET | Freq: Two times a day (BID) | ORAL | Status: DC
Start: 2023-07-15 — End: 2023-07-15
  Filled 2023-07-14: qty 2

## 2023-07-14 MED ORDER — OLMESARTAN-AMLODIPINE-HCTZ 20-5-12.5 MG PO TABS: 1.0000 | ORAL_TABLET | Freq: Every day | ORAL | Status: DC

## 2023-07-14 MED ORDER — BRIMONIDINE TARTRATE 0.2 % OP SOLN
1.0000 [drp] | Freq: Three times a day (TID) | OPHTHALMIC | Status: DC
  Administered 2023-07-15 – 2023-07-20 (×17): 1 [drp] via OPHTHALMIC
  Filled 2023-07-14: qty 5

## 2023-07-14 MED ORDER — MONTELUKAST SODIUM 10 MG PO TABS
10.0000 mg | ORAL_TABLET | Freq: Every day | ORAL | Status: DC
  Administered 2023-07-15 – 2023-07-19 (×5): 10 mg via ORAL
  Filled 2023-07-14 (×5): qty 1

## 2023-07-14 MED ORDER — ONDANSETRON 4 MG PO TBDP
4.0000 mg | ORAL_TABLET | Freq: Three times a day (TID) | ORAL | Status: DC | PRN
  Administered 2023-07-15: 4 mg via ORAL
  Filled 2023-07-14: qty 1

## 2023-07-14 MED ORDER — POLYETHYLENE GLYCOL 3350 17 G PO PACK
17.0000 g | PACK | Freq: Every day | ORAL | Status: DC
  Administered 2023-07-15 – 2023-07-20 (×6): 17 g via ORAL
  Filled 2023-07-14 (×5): qty 1

## 2023-07-14 NOTE — Assessment & Plan Note (Signed)
 New onset 2 months ago and worsening since; she did have GI bug prior to that. Patient hospitalized for same from 4/11-4/13/25 - thought at that time to be related to mycophenolate. All imaging during that admission was negative. Patient saw [facility] on 4/14 - Neurology suspects symptoms related to vitamin deficiencies 2/2 poor PO intake. Labs collected at that visit do show very low folate, thiamine , B12, as well as abnormal immunoglobulins. Patient does have confusing picture given her complex medical comorbidities. On exam patient does not have any severe neurologic deficits, though pupillary defect is noted which has not been noted in prior ophthalmology or neurology exam notes. Spoke with Radiology who recommended imaging as below. - Admit to FMTS, attending Dr. McDiarmid - Med tele, Vital signs per floor - Continuous cardiac monitoring x 24h - PT/OT to treat - continue LR IVF 100mL/hr - collect folate, B1, B12, TSH, RPR - Start thiamine , folate supplementation - MRI brain and lumbar spine w and wo contrast - AM CBC, BMP - Fall precautions - Day team to consult Neurology as appropriate

## 2023-07-14 NOTE — Assessment & Plan Note (Signed)
>>  ASSESSMENT AND PLAN FOR GENERALIZED WEAKNESS  MYOSITIS WRITTEN ON 07/15/2023  5:22 AM BY MAHMOOD, ATIF, MD  New onset 2 months ago and worsening since; she did have GI bug prior to that. Patient hospitalized for same from 4/11-4/13/25 - thought at that time to be related to mycophenolate. All imaging during that admission was negative. Patient saw [facility] on 4/14 - Neurology suspects symptoms related to vitamin deficiencies 2/2 poor PO intake. Labs collected at that visit do show very low folate, thiamine , B12, as well as abnormal immunoglobulins. Patient does have confusing picture given her complex medical comorbidities. On exam patient does not have any severe neurologic deficits, though pupillary defect is noted which has not been noted in prior ophthalmology or neurology exam notes. Spoke with Radiology who recommended imaging as below. - Admit to FMTS, attending Dr. McDiarmid - Med tele, Vital signs per floor - Continuous cardiac monitoring x 24h - PT/OT to treat - continue LR IVF 100mL/hr - collect folate, B1, B12, TSH, RPR - Start thiamine , folate supplementation - MRI brain and lumbar spine w and wo contrast - AM CBC, BMP - Fall precautions - Day team to consult Neurology as appropriate

## 2023-07-14 NOTE — ED Provider Notes (Addendum)
 [place] EMERGENCY DEPARTMENT AT [facility] Provider Note   CSN: [id number] Arrival date & time: 07/14/23  1731     History  No chief complaint on file.   Tiffany Velasquez is a 28 y.o. female.  HPI    28 year old female comes in with chief complaint of weakness. She has PMHx includes asthma, glaucoma, panuveitis, HTN, anemia and peripheral neuropathy involving her lower extremity.  Patient was recently discharged from the hospital and has had subsequent neurology follow-up completed for lower extremity weakness and numbness.  Patient accompanied by mother, also provides history.  According to the patient, she has had lower extremity burning sensation to her feet for the last several months.  However in the last few weeks, she has also had numbness extending towards her knees.  Patient has difficulty in walking, both because of weakness and also because of numbness in her feet.  Additionally, patient also is having nausea, vomiting and diarrhea for the last 3 days.  Patient states that she is having 1-2 loose bowel movements and about 3-5 episodes of emesis.    Home Medications Prior to Admission medications   Medication Sig Start Date End Date Taking? Authorizing Provider  acetaminophen  (TYLENOL ) 500 MG tablet Take 2 tablets (1,000 mg total) by mouth every 6 (six) hours as needed for mild pain (pain score 1-3) or moderate pain (pain score 4-6). Patient taking differently: Take 500-1,000 mg by mouth every 6 (six) hours as needed for mild pain (pain score 1-3) or moderate pain (pain score 4-6). 07/09/23  Yes Gwyndolyn Lerner, MD  acetaZOLAMIDE  (DIAMOX ) 250 MG tablet Take 500 mg by mouth 2 (two) times daily. 01/17/23  Yes [provider]  albuterol  (PROVENTIL ) (2.5 MG/3ML) 0.083% nebulizer solution Take 3 mLs (2.5 mg total) by nebulization every 6 (six) hours as needed for wheezing or shortness of breath. Patient taking differently: Take 2.5 mg by nebulization every  4 (four) hours as needed for wheezing or shortness of breath. 04/21/23  Yes Rochester Chuck, MD  albuterol  (VENTOLIN  HFA) [phone] Base) MCG/ACT inhaler Inhale 2 puffs every 4-6 hours as needed for cough, wheeze, tightness in chest, or shortness of breath 11/07/22  Yes Ambs, Jeanmarie Millet, FNP  allopurinol  (ZYLOPRIM ) 100 MG tablet Take 1 tablet (100 mg total) by mouth daily. 05/29/23  Yes Albin Huh, MD  azelastine  (ASTELIN ) 0.1 % nasal spray USE 2 SPRAYS IN EACH NOSTRIL TWICE DAILY AS NEEDED FOR RUNNY NOSE/DRAINAGE DOWN THROAT Patient taking differently: 2 sprays 2 (two) times daily. USE 2 SPRAYS IN EACH NOSTRIL TWICE DAILY AS NEEDED FOR RUNNY NOSE/DRAINAGE DOWN THROAT 11/23/22  Yes Rochester Chuck, MD  bimatoprost (LUMIGAN) 0.01 % SOLN Place 1 drop into both eyes at bedtime. 11/01/22  Yes [provider]  brimonidine  (ALPHAGAN ) 0.2 % ophthalmic solution Place 1 drop into both eyes 3 (three) times daily.   Yes [provider]  budesonide -formoterol  (SYMBICORT ) 160-4.5 MCG/ACT inhaler Inhale 2 puffs into the lungs in the morning and at bedtime. Patient taking differently: Inhale 2 puffs into the lungs at bedtime. 11/23/22  Yes Rochester Chuck, MD  colchicine  0.6 MG tablet Take 1 tablet (0.6 mg total) by mouth daily. 05/29/23  Yes Albin Huh, MD  dexlansoprazole  (DEXILANT ) 60 MG capsule Take 1 capsule (60 mg total) by mouth daily. Patient taking differently: Take 60 mg by mouth at bedtime. 05/25/23  Yes Santina Cull R, PA-C  dorzolamide -timolol  (COSOPT ) 2-0.5 % ophthalmic solution Place 1 drop into both eyes  2 (two) times daily. 05/30/23  Yes [provider]  DULoxetine  (CYMBALTA ) 30 MG capsule Take 1 capsule (30 mg total) by mouth daily. Patient taking differently: Take 30 mg by mouth at bedtime. 07/10/23  Yes Patel, Donika K, DO  famotidine  (PEPCID ) 40 MG tablet Take 1 tablet (40 mg total) by mouth at bedtime. 05/25/23  Yes Santina Cull R, PA-C  fluticasone  (FLONASE )  50 MCG/ACT nasal spray Place 1 spray into both nostrils daily. 11/07/22  Yes Ambs, Jeanmarie Millet, FNP  gabapentin  (NEURONTIN ) 300 MG capsule Start at 300 p.o. 3 times daily.  If tolerated work up to 600 mg p.o. 3 times daily Patient taking differently: Take 600 mg by mouth 3 (three) times daily. 06/22/23  Yes Persons, Norma Beckers, [place]  GAMUNEX-C 20 GM/200ML SOLN  05/20/22  Yes [provider]  loratadine  (CLARITIN ) 10 MG tablet Take 1 tablet (10 mg total) by mouth daily. 03/08/23  Yes Ernestina Headland, MD  metoCLOPramide  (REGLAN ) 10 MG tablet Take 1 tablet (10 mg total) by mouth every 8 (eight) hours as needed for nausea. 04/17/23  Yes Iva Mariner, MD  montelukast  (SINGULAIR ) 10 MG tablet Take 1 tablet (10 mg total) by mouth at bedtime. 11/23/22  Yes Rochester Chuck, MD  mycophenolate (CELLCEPT) 500 MG tablet Take 500 mg by mouth 2 (two) times daily.   Yes [provider]  Olmesartan -amLODIPine -HCTZ 20-5-12.5 MG TABS Take 1 tablet by mouth daily. 07/12/23  Yes Goble Last, MD  ondansetron  (ZOFRAN ) 8 MG tablet Take 1 tablet (8 mg total) by mouth every 8 (eight) hours as needed for nausea or vomiting. 05/25/23  Yes Santina Cull R, PA-C  polyethylene glycol powder (GLYCOLAX /MIRALAX ) 17 GM/SCOOP powder Take 17 g by mouth daily. Patient taking differently: Take 17 g by mouth in the morning and at bedtime. 05/10/23  Yes Sowell, Ace Holder, MD  RETIN-A  MICRO 0.04 % gel Apply 1 application  topically daily as needed. 06/28/21  Yes [provider]  RHOPRESSA  0.02 % SOLN Place 1 drop into the right eye at bedtime.   Yes [provider]  sucralfate  (CARAFATE ) 1 GM/10ML suspension Take 10 mLs (1 g total) by mouth 4 (four) times daily -  with meals and at bedtime. Patient taking differently: Take 1 g by mouth 2 (two) times daily. 05/25/23  Yes Santina Cull R, PA-C  vitamin B-12 (VITAMIN B12) 500 MCG tablet Take 1 tablet (500 mcg total) by mouth daily. 07/10/23  Yes Gwyndolyn Lerner, MD   Adalimumab 40 MG/0.4ML PNKT Inject 40 mg into the skin every 14 (fourteen) days. 03/03/20   [provider]      Allergies    Other, Peanut -containing drug products, Shellfish allergy , Apple juice, Orange fruit [citrus], Peach flavoring agent (non-screening), Tomato, and Tree extract    Review of Systems   Review of Systems  All other systems reviewed and are negative.   Physical Exam Updated Vital Signs BP 121/79   Pulse (!) 116   Temp 98.7 F (37.1 C) (Axillary)   Resp (!) 21   Ht 5\' 9"  (1.753 m)   Wt (!) 147.4 kg   SpO2 100%   BMI 47.99 kg/m  Physical Exam Vitals and nursing note reviewed.  Constitutional:      Appearance: She is well-developed.  HENT:     Head: Atraumatic.  Cardiovascular:     Rate and Rhythm: Tachycardia present.  Pulmonary:     Effort: Pulmonary effort is normal.  Musculoskeletal:     Cervical back:  Normal range of motion and neck supple.  Skin:    General: Skin is warm and dry.  Neurological:     Mental Status: She is alert and oriented to person, place, and time.     Cranial Nerves: No cranial nerve deficit.     Sensory: Sensory deficit present.     Motor: Weakness present.     Deep Tendon Reflexes: Reflexes normal.     Comments: Patient has subjective numbness and difficulty with to point discrimination over the bilateral lower extremity below the knee.  She also has 3 out of 5 weakness in bilateral lower extremity.     ED Results / Procedures / Treatments   Labs (all labs ordered are listed, but only abnormal results are displayed) Labs Reviewed  COMPREHENSIVE METABOLIC PANEL WITH GFR - Abnormal; Notable for the following components:      Result Value   Potassium 3.4 (*)    CO2 13 (*)    Albumin 3.1 (*)    AST 208 (*)    ALT 67 (*)    Total Bilirubin 1.8 (*)    Anion gap 20 (*)    All other components within normal limits  CBC WITH DIFFERENTIAL/PLATELET - Abnormal; Notable for the following components:   WBC 13.3 (*)     RBC 3.22 (*)    Hemoglobin 9.5 (*)    HCT 29.8 (*)    RDW 23.9 (*)    nRBC 0.4 (*)    Neutro Abs 11.0 (*)    Abs Immature Granulocytes 0.15 (*)    All other components within normal limits  CK - Abnormal; Notable for the following components:   Total CK 1,018 (*)    All other components within normal limits  SEDIMENTATION RATE - Abnormal; Notable for the following components:   Sed Rate 117 (*)    All other components within normal limits  C-REACTIVE PROTEIN - Abnormal; Notable for the following components:   CRP 27.2 (*)    All other components within normal limits  I-STAT VENOUS BLOOD GAS, ED - Abnormal; Notable for the following components:   pCO2, Ven 21.9 (*)    pO2, Ven 151 (*)    Bicarbonate 14.2 (*)    TCO2 15 (*)    Acid-base deficit 9.0 (*)    Potassium 5.3 (*)    Calcium, Ion 1.14 (*)    HCT 30.0 (*)    Hemoglobin 10.2 (*)    All other components within normal limits  MAGNESIUM   RAPID URINE DRUG SCREEN, HOSP PERFORMED  SALICYLATE LEVEL    EKG None  Radiology No results found.  Procedures .Critical Care  Performed by: Deatra Face, MD Authorized by: Deatra Face, MD   Critical care provider statement:    Critical care time (minutes):  37   Critical care was necessary to treat or prevent imminent or life-threatening deterioration of the following conditions:  Metabolic crisis and dehydration   Critical care was time spent personally by me on the following activities:  Development of treatment plan with patient or surrogate, discussions with consultants, evaluation of patient's response to treatment, examination of patient, ordering and review of laboratory studies, ordering and review of radiographic studies, ordering and performing treatments and interventions, pulse oximetry, re-evaluation of patient's condition, review of old charts and obtaining history from patient or surrogate     Medications Ordered in ED Medications  sodium bicarbonate   injection 50 mEq (has no administration in time range)  lactated ringers  bolus 1,000 mL (  1,000 mLs Intravenous New Bag/Given 07/14/23 2150)  ketorolac  (TORADOL ) 15 MG/ML injection 15 mg (15 mg Intravenous Given 07/14/23 2146)  LORazepam  (ATIVAN ) injection 1 mg (1 mg Intravenous Given 07/14/23 2145)    ED Course/ Medical Decision Making/ A&P                                 Medical Decision Making Amount and/or Complexity of Data Reviewed Labs: ordered.  Risk Prescription drug management. Decision regarding hospitalization.   This patient presents to the ED with chief complaint(s) of nausea, vomiting, weakness and numbness in the lower extremity leading to difficulty in walking with pertinent past medical history of neuropathy, panuveitis.The complaint involves an extensive differential diagnosis and also carries with it a high risk of complications and morbidity.    Patient's neuropathy is new, but has been present for at least 4 months in the lower extremity.  It appears that more recently, the numbness has extended up to the level of the proximal tibia.  She also reports generalized weakness in her legs and difficulty in ambulating.  Although she is having nausea, vomiting, diarrhea, this does not appear to be GBS presentation.  Clinically, it appears that the peripheral neuropathy is a chronic issue, the weakness in the leg is subacute issue, and the nausea, vomiting is an acute issue.  In addition to organic neurologic issues and rheumatologic issues, other possibilities include psychogenic causes.  The initial plan is to get basic labs.   Additional history obtained: Additional history obtained from family Records reviewed previous admission documents and previous MRI of the lumbar spine.  Independent labs interpretation:  The following labs were independently interpreted: Slightly elevated LFTs.  Patient has bilirubin of 1.8, anion gap 20 and her bicarb is significantly low at  13. Her CK is over 1000.  1 L of LR ordered.   Treatment and Reassessment: Patient reassessed.  Still feeling nauseous.  1 of Ativan  ordered.  Toradol  ordered right now for pain.  Patient's renal function is reassuring.  It is unclear what is going on at this time.  Questioning if she will benefit with neurology consultation.  No acute concerns, therefore will defer that pathway to the admitting team.  Bicarb is less than 13.  It is likely contributing to her nausea and vomiting and loss of appetite.  It can also be contributing to weakness. Her venous blood gas however is reassuring. Sed rate is high, but unsure why that is the case at this time.  In addition to 1 unit of LR, will also give her 1 amp of bicarb. For the sake of completeness, salicylate level will be sent.  Final Clinical Impression(s) / ED Diagnoses Final diagnoses:  Dehydration  Low serum bicarbonate    Rx / DC Orders ED Discharge Orders     None         Deatra Face, MD 07/14/23 2224    Deatra Face, MD 07/14/23 2224

## 2023-07-14 NOTE — ED Triage Notes (Signed)
 Pt arrives via [facility] from home c/o N/V/D for three days along with HA and difficulty with ambulation.

## 2023-07-14 NOTE — Telephone Encounter (Signed)
**  After Hours/ Emergency Line Call**  Received a page to call [phone]) - [phone].  Patient: Tiffany Velasquez  Caller: Mother of patient  Confirmed name & DOB of patient with caller  Subjective:  Mother of patient calling because patient has been unable to get up out of bed and be ambulatory.  She has not eaten or drank in several days.  Her urine output is also decreased.  She is also having nausea and emesis with liquids in addition to solids.  Of note she was recently discharged from the hospital in the setting of acute kidney injury with similar presentation.  Was evaluated in our clinic in additionally has seen neurology in the interim.  Objective:  Observations: Conversation with mother  Assessment & Plan  Tiffany Velasquez is a 28 y.o. female with PMHx s/f fatigue, generalized weakness, morbid obesity who calls with the following complaints and concerns:   Dehydration, decreased ambulation   Recommendations:  Per mother's description I am concerned that patient may be severely dehydrated, and may require IV fluid resuscitation. Has her symptoms and not improved since being hospitalized it is likely possible she may require readmittance for further workup. I recommended to bring the patient to the emergency department.  Mother stated she would see if patient's brother is available to help transport or she would call 911.  -- Red flags discussed.   -- Will forward to PCP.  Ivin Marrow, MD, PGY-2 [facility] 4:13 PM 07/14/2023

## 2023-07-14 NOTE — Assessment & Plan Note (Addendum)
 New onset 3 days ago just after recent discharge from hospital. Afebrile. ?medication induced vs infectious given elevated WBC with neutrophilia. Suspect dehydration due to poor PO intake contributing to tachycardia on arrival. S/p 1L IVF bolus in ED. - continue IVF rehydration with LR 100mL/hr - encourage good PO hydration - zofran  PRN

## 2023-07-14 NOTE — H&P (Addendum)
 Hospital Admission History and Physical Service Pager: [phone]  Patient name: Tiffany Velasquez Medical record number: [id number] Date of Birth: 1996-03-24 Age: 28 y.o. Gender: female  Primary Care Provider: Wilhemena Harbour, MD Consultants: None Code Status: Full code Preferred Emergency Contact:  Contact Information     Name Relation Home Work Mobile   Clugston,Cynthia Mother [phone]        Other Contacts   None on File     Chief Complaint: nausea/vomiting, worsening weakness  Assessment and Plan: Tiffany Velasquez is a 28 y.o. female presenting with nausea/vomiting and worsening weakness, numbness . Differential for presentation of this includes GBS, myelopathy, inflammatory polyneuropathy, variety opportunistic infection given patient immunosuppressant medications, medication side effect.   - unclear etiology, though serious concern for some kind of myelopathy and/or infectious insult, particularly given apparently new onset pupillary defect. - Less likely GBS given timeframe of symptom onset 2 months ago, and weakness has not yet spread to diaphragm level. - consider possible medication effects from mycophenolate, however must rule out other more concerning causes as above. - may also be sequelae of previously noted vitamin deficiencies and neuropathies. Follows outpatient with neurology  Assessment & Plan Generalized weakness New onset 2 months ago and worsening since; she did have GI bug prior to that. Patient hospitalized for same from 4/11-4/13/25 - thought at that time to be related to mycophenolate. All imaging during that admission was negative. Patient saw [facility] on 4/14 - Neurology suspects symptoms related to vitamin deficiencies 2/2 poor PO intake. Labs collected at that visit do show very low folate, thiamine , B12, as well as abnormal immunoglobulins. Patient does have confusing picture given her complex medical comorbidities. On exam patient does not  have any severe neurologic deficits, though pupillary defect is noted which has not been noted in prior ophthalmology or neurology exam notes. Spoke with Radiology who recommended imaging as below. - Admit to FMTS, attending Dr. McDiarmid - Med tele, Vital signs per floor - Continuous cardiac monitoring x 24h - PT/OT to treat - continue LR IVF 100mL/hr - collect folate, B1, B12, TSH, RPR - MRI brain and lumbar spine w and wo contrast - AM CBC, BMP - Fall precautions - Day team to consult Neurology as appropriate Nausea & vomiting New onset 3 days ago just after recent discharge from hospital. Afebrile. ?medication induced vs infectious given elevated WBC with neutrophilia. Suspect dehydration due to poor PO intake contributing to tachycardia on arrival. S/p 1L IVF bolus in ED. - continue IVF rehydration with LR 100mL/hr - encourage good PO hydration - zofran  PRN Tachycardia Present on arrival; suspect related to poor PO intake/dehydration as above. 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 (see MAR) Retinal edema: Continue acetazolamide  250 mg twice daily Gout: Continue allopurinol  100 mg daily, colchicine  0.6 mg daily GERD: Continue famotidine  40 mg daily, pantoprazole  40 mg daily (substitute for Dexlansoprazole ) Asthma: Continue Dulera  2 puffs twice daily (substitute for Symbicort ), albuterol  nebulizer PRN, montelukast  10 mg daily Neuropathy: Continue home duloxetine  30 mg daily, Vit B12 1000 mcg daily, gabapentin  600 mg TID HTN: continue home olmesartan -amlodipine -hydrochlorothiazide  20-5-12.5 mg daily   FEN/GI: Regular diet VTE Prophylaxis: Lovenox   Disposition: med tele  History of Present Illness:  Tiffany Velasquez is a 28 y.o. female presenting with bilateral lower extremities numbness and weakness for the last 2 months, worsening.  1.5-62mo ago started  having numbness and pain in her lower extremities. Started in feet and has risen up her legs to below the kneecap. Saw neurologist recently for this issue and was started on new medications. Reports adherence to all medications.  Denies numbness/weakness in arms. Denies headache, vision changes. Denies fecal or urinary incontinence.  She also reports nausea and vomiting since Monday. Has not been able to keep down much food or drink. Was able to have some broth and strawberries earlier today but threw up afterwards. Throwing up about 3 times per day. Also endorses some abdominal pain with vomiting; she says it feels like muscle cramping in her belly from the force of vomiting. Urinating and stooling 1-2 times per day. Sometimes has diarrhea within the last few days, about twice daily. Denies dysuria, hematuria, blood in stool.  Denies fever, chest pain, cough/cold, HA, recent changes in vision. Endorses some mild SOB which she states is chronic, not new.  In the ED, labs revealed elevated CRP, ESR, CK, as well as anion gap. Patient received 1 L IVF bolus and bicarb, and was called for admission due to severity of symptoms.   Review Of Systems: Per HPI.  Pertinent Past Medical History: Asthma Eczema Glaucoma HTN Panuveitis of both eyes Peripheral neuropathy Remainder reviewed in history tab.   Pertinent Past Surgical History: Cataract extraction  Port-A-Cath insertion  Remainder reviewed in history tab.   Pertinent Social History: Tobacco use: Never Alcohol use: No Other Substance use: No Lives with mother, brothers, uncle  Pertinent Family History: None Remainder reviewed in history tab.   Important Outpatient Medications: Acetazolamide  250 mg twice daily Allopurinol  100 mg daily Bimatoprost eyedrops nightly line  Brimonidine  eyedrops 3 times daily Symbicort  2 puffs twice daily Colchicine  0.6 mg daily Dexlansoprazole  60 mg daily Cosopt  eyedrops twice daily Famotidine  40  mg daily Gabapentin  600 mg 3 times daily Olmesartan -amlodipine -HCTZ 20-5-12.5 daily Rhopressa  eyedrops in right eye daily Hydrocodone -acetaminophen  5-3 25 every 4 hours as needed Mycophenolate 500 mg twice daily Vit B12 1000 mcg daily Duloxetine  30 mg daily Remainder reviewed in medication history.   Objective: BP (!) 152/101   Pulse (!) 124   Temp 98.7 F (37.1 C) (Axillary)   Resp 20   Ht 5\' 9"  (1.753 m)   Wt (!) 147.4 kg   SpO2 96%   BMI 47.99 kg/m  Exam: General: Lying in bed, tired-appearing, no acute distress. HEENT: Right pupil remains pinpoint, Left pupil mid-dilated - neither reactive to light. EOM grossly intact. Head normocephalic, dry mucous membranes. Cardio: Tachycardic, regular rhythm, no murmurs on exam. Pulm: Clear, no wheezing, no crackles. No increased work of breathing on room air. Abdominal: bowel sounds present, soft, non-tender except to palpation of epigastric region, non-distended. Extremities: no peripheral edema. Moves all extremities equally. Neuro: CN II: PERRL CN III, IV,VI: EOMI CV V: Normal sensation in V1, V2, V3 CVII: Symmetric smile and brow raise CN VIII: Normal hearing CN IX,X: Symmetric palate raise  CN XI: 5/5 shoulder shrug CN XII: Symmetric tongue protrusion  UE and LE strength 5/5 Irregular sensation in LE bilaterally, did not appear to follow dermatomal pattern. UE sensation intact. Psych:  Cognition and judgment appear intact. Alert, communicative, and cooperative. Answers all questions appropriately.   Labs:  CBC BMET  Recent Labs  Lab 07/14/23 2020 07/14/23 2041  WBC 13.3*  --   HGB 9.5* 10.2*  HCT 29.8* 30.0*  PLT 171  --    Recent Labs  Lab 07/14/23 2020  07/14/23 2041  NA 137 137  K 3.4* 5.3*  CL 104  --   CO2 13*  --   BUN 14  --   CREATININE 0.94  --   GLUCOSE 79  --   CALCIUM 9.6  --      CRP 27.2 ESR 117 CK 1018 Mag 2.2 VBG bicarb 14.2  Of note, labs drawn at recent Neurology appt 4/14 show  very low Vit B1 (<6), low Folate (3.3), as well as abnormal protein electrophoresis and IgA/IgG results.   Omar Bibber, DO 07/14/2023, 11:51 PM PGY-1, [facility]  FPTS Intern pager: [phone], text pages welcome Secure chat group [facility] Teaching Service    FPTS Upper-Level Resident Addendum   I have independently interviewed and examined the patient. I have discussed the above with Dr. Bernardino Bridge and agree with the documented plan. My edits for correction/addition/clarification are included above. Please see any attending notes.   Edison Gore, MD PGY-2, [facility] 07/14/2023 11:53 PM  FPTS Service pager: [phone] (text pages welcome through [facility])

## 2023-07-14 NOTE — Assessment & Plan Note (Signed)
 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

## 2023-07-14 NOTE — Assessment & Plan Note (Signed)
 Present on arrival; suspect related to poor PO intake/dehydration as above. - EKG - CCM x 24h - continue IVF rehydration with LR 100mL/hr - encourage good PO hydration

## 2023-07-14 NOTE — ED Notes (Signed)
 Patient transported to MRI

## 2023-07-15 ENCOUNTER — Other Ambulatory Visit: Payer: Self-pay

## 2023-07-15 DIAGNOSIS — D84821 Immunodeficiency due to drugs: Secondary | ICD-10-CM

## 2023-07-15 DIAGNOSIS — M79671 Pain in right foot: Secondary | ICD-10-CM

## 2023-07-15 DIAGNOSIS — H3093 Unspecified chorioretinal inflammation, bilateral: Secondary | ICD-10-CM | POA: Diagnosis present

## 2023-07-15 DIAGNOSIS — Z95828 Presence of other vascular implants and grafts: Secondary | ICD-10-CM

## 2023-07-15 DIAGNOSIS — Z91010 Allergy to peanuts: Secondary | ICD-10-CM | POA: Diagnosis present

## 2023-07-15 DIAGNOSIS — E538 Deficiency of other specified B group vitamins: Secondary | ICD-10-CM | POA: Diagnosis present

## 2023-07-15 DIAGNOSIS — H4060X0 Glaucoma secondary to drugs, unspecified eye, stage unspecified: Secondary | ICD-10-CM | POA: Diagnosis not present

## 2023-07-15 DIAGNOSIS — R1110 Vomiting, unspecified: Secondary | ICD-10-CM | POA: Diagnosis not present

## 2023-07-15 DIAGNOSIS — E5111 Dry beriberi: Secondary | ICD-10-CM | POA: Diagnosis present

## 2023-07-15 DIAGNOSIS — R700 Elevated erythrocyte sedimentation rate: Secondary | ICD-10-CM | POA: Diagnosis present

## 2023-07-15 DIAGNOSIS — T380X5A Adverse effect of glucocorticoids and synthetic analogues, initial encounter: Secondary | ICD-10-CM

## 2023-07-15 DIAGNOSIS — R7401 Elevation of levels of liver transaminase levels: Secondary | ICD-10-CM

## 2023-07-15 DIAGNOSIS — E860 Dehydration: Secondary | ICD-10-CM

## 2023-07-15 DIAGNOSIS — R748 Abnormal levels of other serum enzymes: Secondary | ICD-10-CM | POA: Diagnosis present

## 2023-07-15 DIAGNOSIS — Z5181 Encounter for therapeutic drug level monitoring: Secondary | ICD-10-CM

## 2023-07-15 DIAGNOSIS — R7982 Elevated C-reactive protein (CRP): Secondary | ICD-10-CM

## 2023-07-15 DIAGNOSIS — R634 Abnormal weight loss: Secondary | ICD-10-CM | POA: Diagnosis present

## 2023-07-15 DIAGNOSIS — R1114 Bilious vomiting: Secondary | ICD-10-CM

## 2023-07-15 DIAGNOSIS — T502X5A Adverse effect of carbonic-anhydrase inhibitors, benzothiadiazides and other diuretics, initial encounter: Secondary | ICD-10-CM | POA: Diagnosis present

## 2023-07-15 DIAGNOSIS — E519 Thiamine deficiency, unspecified: Secondary | ICD-10-CM | POA: Diagnosis present

## 2023-07-15 HISTORY — DX: Elevation of levels of liver transaminase levels: R74.01

## 2023-07-15 LAB — BASIC METABOLIC PANEL WITH GFR
Anion gap: 20 — ABNORMAL HIGH (ref 5–15)
BUN: 16 mg/dL (ref 6–20)
CO2: 13 mmol/L — ABNORMAL LOW (ref 22–32)
Calcium: 9.5 mg/dL (ref 8.9–10.3)
Chloride: 105 mmol/L (ref 98–111)
Creatinine, Ser: 0.95 mg/dL (ref 0.44–1.00)
GFR, Estimated: >60 mL/min (ref >60)
Glucose, Bld: 69 mg/dL — ABNORMAL LOW (ref 70–99)
Potassium: 3.5 mmol/L (ref 3.5–5.1)
Sodium: 138 mmol/L (ref 135–145)

## 2023-07-15 LAB — CBC
HCT: 30 % — ABNORMAL LOW (ref 36.0–46.0)
Hemoglobin: 9.5 g/dL — ABNORMAL LOW (ref 12.0–15.0)
MCH: 29.1 pg (ref 26.0–34.0)
MCHC: 31.7 g/dL (ref 30.0–36.0)
MCV: 91.7 fL (ref 80.0–100.0)
Platelets: 169 10*3/uL (ref 150–400)
RBC: 3.27 MIL/uL — ABNORMAL LOW (ref 3.87–5.11)
RDW: 23.9 % — ABNORMAL HIGH (ref 11.5–15.5)
WBC: 13.2 10*3/uL — ABNORMAL HIGH (ref 4.0–10.5)
nRBC: 0.5 % — ABNORMAL HIGH (ref 0.0–0.2)

## 2023-07-15 LAB — VITAMIN D 25 HYDROXY (VIT D DEFICIENCY, FRACTURES): Vit D, 25-Hydroxy: 6.17 ng/mL — ABNORMAL LOW (ref 30–100)

## 2023-07-15 LAB — VITAMIN B12: Vitamin B-12: 449 pg/mL (ref 180–914)

## 2023-07-15 LAB — RPR: RPR Ser Ql: NONREACTIVE

## 2023-07-15 LAB — FOLATE: Folate: 4 ng/mL — ABNORMAL LOW (ref >5.9)

## 2023-07-15 LAB — TSH: TSH: 1.259 u[IU]/mL (ref 0.350–4.500)

## 2023-07-15 MED ORDER — THIAMINE MONONITRATE 100 MG PO TABS: 100.0000 mg | ORAL_TABLET | Freq: Every day | ORAL | Status: DC

## 2023-07-15 MED ORDER — THIAMINE HCL 100 MG/ML IJ SOLN
250.0000 mg | Freq: Every day | INTRAVENOUS | Status: DC
  Administered 2023-07-18 – 2023-07-20 (×3): 250 mg via INTRAVENOUS
  Filled 2023-07-15 (×4): qty 2.5

## 2023-07-15 MED ORDER — THIAMINE HCL 100 MG/ML IJ SOLN
100.0000 mg | Freq: Three times a day (TID) | INTRAMUSCULAR | Status: DC
  Administered 2023-07-15: 100 mg via INTRAVENOUS
  Filled 2023-07-15: qty 2

## 2023-07-15 MED ORDER — VITAMIN B-12 1000 MCG PO TABS: 1000.0000 ug | ORAL_TABLET | Freq: Every day | ORAL | Status: DC

## 2023-07-15 MED ORDER — SODIUM CHLORIDE 0.9 % IV SOLN
1.0000 mg | Freq: Every day | INTRAVENOUS | Status: DC
  Administered 2023-07-15: 1 mg via INTRAVENOUS
  Filled 2023-07-15 (×2): qty 0.2

## 2023-07-15 MED ORDER — GADOBUTROL 1 MMOL/ML IV SOLN
10.0000 mL | Freq: Once | INTRAVENOUS | Status: AC | PRN
  Administered 2023-07-15: 10 mL via INTRAVENOUS

## 2023-07-15 MED ORDER — FOLIC ACID 5 MG/ML IJ SOLN
1.0000 mg | Freq: Every day | INTRAMUSCULAR | Status: DC
  Administered 2023-07-16 – 2023-07-20 (×5): 1 mg via INTRAVENOUS
  Filled 2023-07-15 (×8): qty 0.2

## 2023-07-15 MED ORDER — THIAMINE HCL 100 MG/ML IJ SOLN: 100.0000 mg | Freq: Every day | INTRAMUSCULAR | Status: DC

## 2023-07-15 MED ORDER — HYDROCHLOROTHIAZIDE 12.5 MG PO TABS
12.5000 mg | ORAL_TABLET | Freq: Every day | ORAL | Status: DC
  Administered 2023-07-15 – 2023-07-20 (×6): 12.5 mg via ORAL
  Filled 2023-07-15 (×6): qty 1

## 2023-07-15 MED ORDER — VITAMIN D (ERGOCALCIFEROL) 1.25 MG (50000 UNIT) PO CAPS
50000.0000 [IU] | ORAL_CAPSULE | ORAL | Status: DC
  Administered 2023-07-15: 50000 [IU] via ORAL
  Filled 2023-07-15: qty 1

## 2023-07-15 MED ORDER — FOLIC ACID 1 MG PO TABS
1.0000 mg | ORAL_TABLET | Freq: Every day | ORAL | Status: DC
  Administered 2023-07-15: 1 mg via ORAL
  Filled 2023-07-15: qty 1

## 2023-07-15 MED ORDER — THIAMINE HCL 100 MG/ML IJ SOLN
500.0000 mg | Freq: Three times a day (TID) | INTRAVENOUS | Status: AC
  Administered 2023-07-15 – 2023-07-17 (×9): 500 mg via INTRAVENOUS
  Filled 2023-07-15 (×9): qty 5

## 2023-07-15 MED ORDER — CYANOCOBALAMIN 1000 MCG/ML IJ SOLN
1000.0000 ug | Freq: Every day | INTRAMUSCULAR | Status: DC
  Administered 2023-07-15 – 2023-07-20 (×6): 1000 ug via INTRAMUSCULAR
  Filled 2023-07-15 (×7): qty 1

## 2023-07-15 MED ORDER — LIDOCAINE 4 % EX CREA
TOPICAL_CREAM | Freq: Three times a day (TID) | CUTANEOUS | Status: DC
  Administered 2023-07-15 – 2023-07-20 (×8): 1 via TOPICAL
  Filled 2023-07-15 (×6): qty 5

## 2023-07-15 MED ORDER — IRBESARTAN 75 MG PO TABS
150.0000 mg | ORAL_TABLET | Freq: Every day | ORAL | Status: DC
  Administered 2023-07-15 – 2023-07-20 (×6): 150 mg via ORAL
  Filled 2023-07-15 (×6): qty 2

## 2023-07-15 MED ORDER — ADULT MULTIVITAMIN W/MINERALS CH
1.0000 | ORAL_TABLET | Freq: Every day | ORAL | Status: DC
  Administered 2023-07-15 – 2023-07-20 (×6): 1 via ORAL
  Filled 2023-07-15 (×6): qty 1

## 2023-07-15 MED ORDER — PANTOPRAZOLE SODIUM 40 MG PO TBEC
40.0000 mg | DELAYED_RELEASE_TABLET | Freq: Every day | ORAL | Status: DC
  Administered 2023-07-15 – 2023-07-20 (×5): 40 mg via ORAL
  Filled 2023-07-15 (×4): qty 1

## 2023-07-15 NOTE — Assessment & Plan Note (Signed)
 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

## 2023-07-15 NOTE — Progress Notes (Signed)
 Daily Progress Note Intern Pager: [phone]  Patient name: ARONDA BURFORD Medical record number: [id number] Date of birth: Jun 15, 1995 Age: 28 y.o. Gender: female  Primary Care Provider: Wilhemena Harbour, MD Consultants: Neurology Code Status: Full  Pt Overview and Major Events to Date:  4/18: Admitted   Assessment and Plan:  Wendelin Halsted 28 y.o. F admitted for progressive weakness, numbness and pupillary defect concerning for myelopathy and/or infectious insult. 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  Assessment & Plan Generalized weakness New onset 2 months ago and worsening since. Patient hospitalized for same symptoms from 4/11-4/13/25 - thought at that time to be related to mycophenolate. All imaging during that admission was negative. Patient saw [facility] on 4/14 who suspected symptoms related to vitamin deficiencies 2/2 poor PO intake - workup then demonstrated low folate, thiamine , B12, as well as abnormal immunoglobulins A & G. On admission, vit b12 wnl, low folate, TSH wnl, RPR nonreactive, elevated ESR/CRP. MRI lumbar and brain unremarkable. Pupil defect noted during current admission. Overall unclear etiology of constellation of symptoms - concern for transverse myelitis vs CIPD vs Beri Beri among already known vitamin deficiencies.  - Neurology consulted - appreciate recs - Consider thoracic and cervical MRI (at least 24 hours following previous contrast admin) - Spoke with Dr Cardell Chang on-call Ophthalmology at [facility]. 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 Recommend CTM at this time.  - Anti-jo antibody ordered  - Echo ordered  - CCM - PT/OT to treat - continue LR IVF 100mL/hr - Cont thiamine , folate supplementation - ending thiamine  lab - AM CBC, BMP Nausea & vomiting New  onset 3 days ago just after recent discharge from hospital. Symptomatically improving today. Concern for medication induced vs infectious given elevated WBC with neutrophilia. Suspect dehydration due to poor PO intake contributing to tachycardia on arrival. S/p 1L IVF bolus in ED. - continue IVF rehydration with LR 100mL/hr - zofran  PRN Tachycardia Suspect related to poor PO intake/dehydration as above. EKG with sinus tachycardia.  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 (see MAR) Retinal edema: Continue acetazolamide  250 mg twice daily Gout: Continue allopurinol  100 mg daily, colchicine  0.6 mg daily GERD: Continue famotidine  40 mg daily, pantoprazole  40 mg daily (substitute for Dexlansoprazole ) Asthma: Continue Dulera  2 puffs twice daily (substitute for Symbicort ), albuterol  nebulizer PRN, montelukast  10 mg daily Neuropathy: Continue home duloxetine  30 mg daily, Vit B12 1000 mcg daily, gabapentin  600 mg TID HTN: continue home olmesartan -amlodipine -hydrochlorothiazide  20-5-12.5 mg daily  FEN/GI: Regular diet PPx: Lovenox  Dispo: Home pending clinical improvement   Subjective:  Continued lower extremity weakness and numbness. Improving nausea, was able to keep some food down today. No eye pain.   Objective: Temp:  [98.5 F (36.9 C)-98.7 F (37.1 C)] 98.5 F (36.9 C) (04/19 0603) Pulse Rate:  [111-130] 121 (04/19 0600) Resp:  [11-27] 24 (04/19 0600) BP: (115-152)/(77-104) 143/83 (04/19 0600) SpO2:  [96 %-100 %] 100 % (04/19 0600) Weight:  [147.4 kg] 147.4 kg (04/18 2206) Physical Exam: General: No acute distress.  CV: Normal S1/S2. No extra heart sounds. Warm and well-perfused. Pulm: Breathing comfortably on room air. CTAB anteriorly. No increased WOB. Abd: Soft, non-tender, non-distended. Skin:  Warm, dry. Neuro: Alert, interactive. R pupil mid-dilated, not reactive to light.  L  pupil irregularly shaped, constricted, not reactive to light. Facial sensation intact bilaterally. Regular speech. 5/5 upper extremity strength. 4/5 RLE strength, 5/5 LLE strength. Reduced lower extremity sensation bilaterally.    Laboratory: Most recent CBC Lab Results  Component Value Date   WBC 13.2 (H) 07/15/2023   HGB 9.5 (L) 07/15/2023   HCT 30.0 (L) 07/15/2023   MCV 91.7 07/15/2023   PLT 169 07/15/2023   Most recent BMP    Latest Ref Rng & Units 07/15/2023    1:43 AM  BMP  Glucose 70 - 99 mg/dL 69   BUN 6 - 20 mg/dL 16   Creatinine 1.61 - 1.00 mg/dL 0.96   Sodium 045 - 409 mmol/L 138   Potassium 3.5 - 5.1 mmol/L 3.5   Chloride 98 - 111 mmol/L 105   CO2 22 - 32 mmol/L 13   Calcium 8.9 - 10.3 mg/dL 9.5    MRI Lumbar: stable, normal  MRI Brain: normal  ESR 117 CRP 27.2  Carey Chapman, MD 07/15/2023, 7:37 AM  PGY-1, [facility] FPTS Intern pager: [phone], text pages welcome Secure chat group [facility] Teaching Service

## 2023-07-15 NOTE — Assessment & Plan Note (Addendum)
 New onset 3 days ago just after recent discharge from hospital. Symptomatically improving today. Concern for medication induced vs infectious given elevated WBC with neutrophilia. Suspect dehydration due to poor PO intake contributing to tachycardia on arrival. S/p 1L IVF bolus in ED. - continue IVF rehydration with LR 100mL/hr - zofran  PRN

## 2023-07-15 NOTE — Assessment & Plan Note (Signed)
>>  ASSESSMENT AND PLAN FOR GENERALIZED WEAKNESS  MYOSITIS WRITTEN ON 07/15/2023  2:32 PM BY DIONA PERKINS, MD  New onset 2 months ago and worsening since. Patient hospitalized for same symptoms from 4/11-4/13/25 - thought at that time to be related to mycophenolate. All imaging during that admission was negative. Patient saw [facility] on 4/14 who suspected symptoms related to vitamin deficiencies 2/2 poor PO intake - workup then demonstrated low folate, thiamine , B12, as well as abnormal immunoglobulins A & G. On admission, vit b12 wnl, low folate, TSH wnl, RPR nonreactive, elevated ESR/CRP. MRI lumbar and brain unremarkable. Pupil defect noted during current admission. Overall unclear etiology of constellation of symptoms - concern for transverse myelitis vs CIPD vs Beri Beri among already known vitamin deficiencies.  - Neurology consulted - appreciate recs - Consider thoracic and cervical MRI (at least 24 hours following previous contrast admin) - Spoke with Dr Candyce on-call Ophthalmology at [facility]. Per Dr Candyce chart review, pupillary defects may have been appreciated at prior visits and pupil changes may be seen iso known uveitis. Recommend CTM at this time.  - Anti-jo antibody ordered  - Echo ordered  - CCM - PT/OT to treat - continue LR IVF 100mL/hr - Cont thiamine , folate supplementation - ending thiamine  lab - AM CBC, BMP

## 2023-07-15 NOTE — Evaluation (Signed)
 Occupational Therapy Evaluation Patient Details Name: Tiffany Velasquez MRN: [id number] DOB: 1995/05/31 Today's Date: 07/15/2023   History of Present Illness   Pt is 28 y.o. female presenting 07/14/23 with 2 months of progressive weakness. Pt has fallen 6 times in the past 2 weeks. She was recently admitted 4/11 for generalized weakness as well. PMH asthma, glaucoma, panuveitis, HTN, anemia and peripheral neuropathy, obesity.     Clinical Impressions Pt admitted for above, PTA pt had assist from family to stand then they would stand close by to help her ambulate in home, she also reports having assist with ADLs from her family. Pt currently exhibits a fear of falling, not able to rise into standing with total A from OT, pt also noted to have blurry vision in R field which was more prominent nasally. She requires significant assist for ADLs at this time. OT to continue following pt acutely to address listed deficits and help transition to next level of care. Patient has the potential to reach Mod I and demos the ability to tolerate 3 hours of therapy. Pt would benefit from an intensive rehab program to help maximize functional independence..      If plan is discharge home, recommend the following:   A lot of help with walking and/or transfers;Two people to help with walking and/or transfers;A lot of help with bathing/dressing/bathroom;Assistance with cooking/housework     Functional Status Assessment   Patient has had a recent decline in their functional status and demonstrates the ability to make significant improvements in function in a reasonable and predictable amount of time.     Equipment Recommendations   Wheelchair (measurements OT);Wheelchair cushion (measurements OT);Hospital bed     Recommendations for Other Services   Rehab consult     Precautions/Restrictions   Precautions Precautions: Fall Recall of Precautions/Restrictions: Intact Precaution/Restrictions  Comments: 6 falls recently, FOF Restrictions Weight Bearing Restrictions Per Provider Order: No     Mobility Bed Mobility Overal bed mobility: Needs Assistance Bed Mobility: Rolling, Sidelying to Sit, Sit to Supine Rolling: Supervision Sidelying to sit: Mod assist Supine to sit: Mod assist Sit to supine: Mod assist   General bed mobility comments: Mod A to raise trunk, pt able to roll into sidelying without physical assist. Mod A for BLEs return to supine.    Transfers Overall transfer level: Needs assistance Equipment used: None (face to face method) Transfers: Sit to/from Stand Sit to Stand: Total assist           General transfer comment: Pt unable to clear bottom from bed, anticipate FOF limiting pt as she demonstrated little effort from BLEs during rise.      Balance Overall balance assessment: Needs assistance Sitting-balance support: No upper extremity supported, Feet supported Sitting balance-Leahy Scale: Poor Sitting balance - Comments: CGA to Mod A, strong cervical flexion. losing balance posteriorly at times.                                   ADL either performed or assessed with clinical judgement   ADL Overall ADL's : Needs assistance/impaired Eating/Feeding: Independent;Bed level   Grooming: Bed level;Set up   Upper Body Bathing: Bed level;Minimal assistance   Lower Body Bathing: Maximal assistance;Bed level;Sitting/lateral leans   Upper Body Dressing : Bed level;Moderate assistance   Lower Body Dressing: Total assistance;Sitting/lateral leans     Toilet Transfer Details (indicate cue type and reason): unable to stand on this  date, anticipate Max A +2 Toileting- Clothing Manipulation and Hygiene: Maximal assistance;+2 for safety/equipment;+2 for physical assistance               Vision Baseline Vision/History: 0 No visual deficits Patient Visual Report: Blurring of vision Vision Assessment?: Yes Eye Alignment: Within  Functional Limits Ocular Range of Motion: Within Functional Limits Alignment/Gaze Preference: Within Defined Limits Tracking/Visual Pursuits: Able to track stimulus in all quads without difficulty Convergence: Impaired (comment) (R eye not converging.) Visual Fields: Impaired-to be further tested in functional context;Right visual field deficit Diplopia Assessment:  (denies any) Depth Perception: Undershoots (R side worse than L field) Additional Comments: Pt notes R eye to be blurry in all quadrants during visual assessment, more impacted nasally. Could not accurately identify # of therapists fingers nasally but could peripherally with increased time. mild nystagmus of bilat eyes at L end range.     Perception         Praxis         Pertinent Vitals/Pain Pain Assessment Pain Assessment: Faces Faces Pain Scale: Hurts a little bit Pain Location: bottom of feet Pain Descriptors / Indicators: Discomfort, Sharp Pain Intervention(s): Monitored during session, Limited activity within patient's tolerance     Extremity/Trunk Assessment Upper Extremity Assessment Upper Extremity Assessment: Generalized weakness   Lower Extremity Assessment Lower Extremity Assessment: Generalized weakness RLE Sensation: history of peripheral neuropathy LLE Sensation: history of peripheral neuropathy   Cervical / Trunk Assessment Cervical / Trunk Assessment: Kyphotic;Other exceptions Cervical / Trunk Exceptions: poor control of cervical, strong flexion   Communication Communication Communication: No apparent difficulties   Cognition Arousal: Alert Behavior During Therapy: WFL for tasks assessed/performed Cognition: No apparent impairments             OT - Cognition Comments: Fear of Falling (FOF)                 Following commands: Intact       Cueing  General Comments   Cueing Techniques: Verbal cues  HR noted to be in the 120s at rest and with activity, called NT to fix  tele leads as some were missing. Pt wondering about cervical brace, messaged MD as it may offer more support   Exercises Other Exercises Other Exercises: AAROM cerivcal flexion/ext, lateral flexion   Shoulder Instructions      Home Living Family/patient expects to be discharged to:: Private residence Living Arrangements: Parent Available Help at Discharge: Family;Available 24 hours/day Type of Home: House Home Access: Stairs to enter [employer] of Steps: 4 Entrance Stairs-Rails: Right (brothers help her on the other side) Home Layout: One level     Bathroom Shower/Tub: Tub/shower unit;Curtain   Firefighter: Standard     Home Equipment: Agricultural consultant (2 wheels);BSC/3in1          Prior Functioning/Environment Prior Level of Function : Needs assist       Physical Assist : ADLs (physical);Mobility (physical) Mobility (physical): Gait;Stairs ADLs (physical): IADLs;Bathing;Dressing Mobility Comments: uses RW for longer distances in the house, otherwise furniture/wall walks, brothers help pt rise into standing until steady then hand her RW ADLs Comments: family helps her in and out of tub to shower seat and helps her get washed up, she dresses herself, A for toileting.    OT Problem List: Impaired balance (sitting and/or standing);Pain;Obesity;Decreased strength;Impaired vision/perception   OT Treatment/Interventions: Self-care/ADL training;DME and/or AE instruction;Balance training;Patient/family education;Visual/perceptual remediation/compensation      OT Goals(Current goals can be found in the care plan  section)   Acute Rehab OT Goals Patient Stated Goal: To get better OT Goal Formulation: With patient Time For Goal Achievement: 07/29/23 Potential to Achieve Goals: Good ADL Goals Pt Will Perform Grooming: with set-up;sitting Pt Will Transfer to Toilet: with contact guard assist;stand pivot transfer;bedside commode Pt/caregiver will Perform Home  Exercise Program: Increased strength;Both right and left upper extremity;With theraband;With written HEP provided;Independently Additional ADL Goal #3: Pt will complete cervical ROM/strenghtening HEP independently   OT Frequency:  Min 2X/week    Co-evaluation              AM-PAC OT "6 Clicks" Daily Activity     Outcome Measure Help from another person eating meals?: A Little Help from another person taking care of personal grooming?: A Little Help from another person toileting, which includes using toliet, bedpan, or urinal?: A Lot Help from another person bathing (including washing, rinsing, drying)?: A Lot Help from another person to put on and taking off regular upper body clothing?: A Lot Help from another person to put on and taking off regular lower body clothing?: Total 6 Click Score: 13   End of Session Nurse Communication: Need for lift equipment (+2 stedy if attempting to get OOB,)  Activity Tolerance: Patient tolerated treatment well Patient left: in bed;with call bell/phone within reach;with bed alarm set  OT Visit Diagnosis: Unsteadiness on feet (R26.81);Other abnormalities of gait and mobility (R26.89);Muscle weakness (generalized) (M62.81);Pain;Low vision, both eyes (H54.2) Pain - Right/Left:  (bilat) Pain - part of body:  (bottom of feet)                Time: 1610-9604 OT Time Calculation (min): 29 min Charges:  OT General Charges $OT Visit: 1 Visit OT Evaluation $OT Eval Moderate Complexity: 1 Mod OT Treatments $Therapeutic Activity: 8-22 mins  07/15/2023  AB, OTR/L  Acute Rehabilitation Services  Office: [phone]   Jorene New 07/15/2023, 6:05 PM

## 2023-07-15 NOTE — Assessment & Plan Note (Addendum)
 Suspect related to poor PO intake/dehydration as above. EKG with sinus tachycardia.  - Echo ordered  - CCM  - continue IVF rehydration with LR 130mL/hr

## 2023-07-15 NOTE — ED Notes (Signed)
 Pt assisted to roll onto left side.

## 2023-07-15 NOTE — Plan of Care (Signed)

## 2023-07-15 NOTE — Progress Notes (Signed)
 New Admission Note:   Arrival Method: stretcher Mental Orientation: aa+ox4 Telemetry: box 3, ST Assessment: Completed Skin: c/d/i IV: right chest PCA Pain: denies Tubes: n/a Safety Measures: Safety Fall Prevention Plan has been given, discussed and signed Admission: Completed [facility] Orientation: Patient has been orientated to the room, unit and staff.  Family: not present  Orders have been reviewed and implemented. Will continue to monitor the patient. Call light has been placed within reach and bed alarm has been activated.   Elvina Hammers, RN

## 2023-07-15 NOTE — ED Notes (Signed)
 Pt assisted onto right side with pillow behind back per request.

## 2023-07-15 NOTE — Plan of Care (Signed)
 Notified by OT patient experiencing R eye blurriness. Went to bedside to discuss with patient. Patient reports this blurriness began a week and a half ago and has not changed since onset. No acute changes at this time. Encouraged patient to inform team of any changes she may notice. Will proceed with visual acuity exam as recommended by Neurology.

## 2023-07-15 NOTE — Assessment & Plan Note (Addendum)
 New onset 2 months ago and worsening since. Patient hospitalized for same symptoms from 4/11-4/13/25 - thought at that time to be related to mycophenolate. All imaging during that admission was negative. Patient saw [facility] on 4/14 who suspected symptoms related to vitamin deficiencies 2/2 poor PO intake - workup then demonstrated low folate, thiamine , B12, as well as abnormal immunoglobulins A & G. On admission, vit b12 wnl, low folate, TSH wnl, RPR nonreactive, elevated ESR/CRP. MRI lumbar and brain unremarkable. Pupil defect noted during current admission. Overall unclear etiology of constellation of symptoms - concern for transverse myelitis vs CIPD vs Beri Beri among already known vitamin deficiencies.  - Neurology consulted - appreciate recs - Consider thoracic and cervical MRI (at least 24 hours following previous contrast admin) - Spoke with Dr Cardell Chang on-call Ophthalmology at [facility]. 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 Recommend CTM at this time.  - Anti-jo antibody ordered  - Echo ordered  - CCM - PT/OT to treat - continue LR IVF 100mL/hr - Cont thiamine , folate supplementation - ending thiamine  lab - AM CBC, BMP

## 2023-07-15 NOTE — Consult Note (Signed)
 NEUROLOGY CONSULT NOTE   Date of service: July 15, 2023 Patient Name: Tiffany Velasquez MRN:  [id number] DOB:  [birth year] Chief Complaint: "Weakness" Requesting Provider: McDiarmid, Demetra Filter, MD  History of Present Illness  Tiffany Velasquez is a 28 y.o. female with hx of HTN, peripheral focal chorioretinal inflammation of both eyes (on cellcept, humira, IVIG), posterior synechiae (iris, left eye), glaucoma, obesity, HTN, and asthma who has had 2 months of progressive weakness. She has lost 150lbs over the last 12 months in preparation of bariatric surgery, however she states some of this weight loss was unintentional. She was not on an vitamins or supplements and was recommended to start supplements after her outpatient visit with Dr. Lydia Sams. On 4/17 she saw her PCP and was vomiting and weak. They recommended she come to the ED.  Fallen 6 times in 2 weeks Scared to walk now, lack of feeling in her legs, occasionally feels dizzy when she stands too fast  Right shoulder is sore from her family pulling on her arm to get her off the ground  It appears her symptoms generally started in February 2025 initially felt to possibly be gout  Summary of immunosuppressive history Summary of ocular history  Follows with [facility] Dr. 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 Previously on Dupixent injections. She is s/p phaco OD 02/12/19 with Dr. Lunda Salines and s/p IVO OD 03/15/19. She is s/p Yutiq OD 04/20/21.  Currently on Cellcept 500 mg BID, Humira 40 mg SQ q2weeks, IVIG infusions, Diamox  500 mg BID Last IVIG infusion was 3/23 and she has home health.   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  Vitamin B12 is 194.  She is not diabetic not drink alcohol.  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    Past Surgical History:  Procedure Laterality Date   BIOPSY  04/04/2023   Procedure: BIOPSY;  Surgeon: Junie Olds, MD;  Location: [facility];  Service: General;;   CATARACT EXTRACTION     ESOPHAGOGASTRODUODENOSCOPY N/A 04/04/2023   Procedure: ESOPHAGOGASTRODUODENOSCOPY (EGD);  Surgeon: Junie Olds, MD;  Location: [facility];  Service: General;  Laterality: N/A;   PORTA CATH INSERTION      Family History: Family History  Problem Relation Age of Onset   Asthma Mother    Allergic rhinitis Father    Sudden death Cousin    Eczema Neg Hx    Urticaria Neg Hx     Social History  reports that she  has never smoked. She has never used smokeless tobacco. She reports current alcohol use. She reports that she does not use drugs.  Allergies  Allergen Reactions   Other Itching, Rash and Swelling    Seafood,tomato paste, peanut  butter, peaches, oranges, apples Throat swelling  Dust mite, oak trees, grass- causes rash, itching   Peanut -Containing Drug Products Anaphylaxis   Shellfish Allergy  Anaphylaxis    Throat swelling    Apple Juice Rash   Orange Fruit [Citrus] Rash   Peach Flavoring Agent (Non-Screening) Rash   Tomato Rash, Hives and Itching   Tree Extract Rash    Medications   Current Facility-Administered Medications:    albuterol  (PROVENTIL ) (2.5 MG/3ML) 0.083% nebulizer solution 2.5 mg, 2.5 mg, Nebulization, Q6H PRN, Spence, Sarah, DO   allopurinol  (ZYLOPRIM ) tablet 100 mg, 100 mg, Oral, Daily, Spence, Sarah, DO   azelastine  (ASTELIN ) 0.1 % nasal spray 2 spray, 2 spray, Each Nare, BID, Spence, Sarah, DO   brimonidine  (ALPHAGAN ) 0.2 % ophthalmic solution 1 drop, 1 drop, Both Eyes, TID, Spence, Sarah, DO   Chlorhexidine  Gluconate Cloth 2 % PADS 6 each, 6 each, Topical, Daily, McDiarmid, Demetra Filter, MD, 6 each at 07/15/23 0841   colchicine  tablet 0.6 mg, 0.6 mg, Oral, Daily, Spence, Sarah, DO   dorzolamide -timolol  (COSOPT ) 2-0.5 % ophthalmic solution 1 drop, 1 drop, Both Eyes, BID, Spence, Sarah, DO   DULoxetine  (CYMBALTA ) DR capsule 30 mg, 30 mg, Oral, Daily, Spence, Sarah, DO   enoxaparin  (LOVENOX ) injection 70 mg, 70 mg, Subcutaneous, Q24H, Spence, Sarah, DO   famotidine  (PEPCID ) tablet 40 mg, 40 mg, Oral, QHS, Spence, Sarah, DO   folic acid  (FOLVITE ) tablet 1 mg, 1 mg, Oral, Daily, Mahmood, Atif, MD   gabapentin  (NEURONTIN ) capsule 600 mg, 600 mg, Oral, TID, Spence, Sarah, DO   irbesartan  (AVAPRO ) tablet 150 mg, 150 mg, Oral, Daily **AND** hydrochlorothiazide  (HYDRODIURIL ) tablet 12.5 mg, 12.5 mg, Oral, Daily, McDiarmid, Demetra Filter, MD   lactated ringers  infusion, , Intravenous,  Continuous, Omar Bibber, DO, Last Rate: 100 mL/hr at 07/14/23 2350, New Bag at 07/14/23 2350   latanoprost  (XALATAN ) 0.005 % ophthalmic solution 1 drop, 1 drop, Both Eyes, QHS, Spence, Sarah, DO   mometasone -formoterol  (DULERA ) 200-5 MCG/ACT inhaler 2 puff, 2 puff, Inhalation, BID, Spence, Sarah, DO   montelukast  (SINGULAIR ) tablet 10 mg, 10 mg, Oral, QHS, Spence, Sarah, DO   ondansetron  (ZOFRAN -ODT) disintegrating tablet 4 mg, 4 mg, Oral, Q8H PRN, Spence, Sarah, DO   pantoprazole  (PROTONIX ) EC tablet 40 mg, 40 mg,  Oral, Daily, Omar Bibber, DO   polyethylene glycol (MIRALAX  / GLYCOLAX ) packet 17 g, 17 g, Oral, Daily, Spence, Sarah, DO   sodium chloride  flush (NS) 0.9 % injection 10-40 mL, 10-40 mL, Intracatheter, PRN, McDiarmid, Demetra Filter, MD   thiamine  (VITAMIN B1) injection 100 mg, 100 mg, Intravenous, TID, Baby Bolt, Atif, MD   vitamin B-12 (CYANOCOBALAMIN ) tablet 500 mcg, 500 mcg, Oral, Daily, Omar Bibber, DO  Vitals   Vitals:   07/15/23 0603 07/15/23 0808 07/15/23 0840 07/15/23 0842  BP:  (!) 137/93  (!) 154/79  Pulse:  (!) 119  (!) 128  Resp:  (!) 21    Temp: 98.5 F (36.9 C)   98.2 F (36.8 C)  TempSrc: Oral   Oral  SpO2:  100%  99%  Weight:   (!) 148.4 kg   Height:   5\' 9"  (1.753 m)     Body mass index is 48.31 kg/m.  Physical Exam   Constitutional: Appears mildly uncomfortable in bed Psych: Affect anxious but cooperative Eyes: No scleral injection.  HENT: No OP obstruction.  Head: Normocephalic.  Cardiovascular: Normal rate and regular rhythm.  Respiratory: Effort normal, non-labored breathing  Neurologic Examination   Neuro: Mental Status: Patient is awake, alert, oriented to person, place, month, year, and situation. Patient is able to give a clear and coherent history. No signs of aphasia or neglect Cranial Nerves: II: Visual Fields are full. Left pupil mildly ovoid ~2 mm, nonreactive, right is 4 mm sluggishily reactive to 2  III,IV, VI: EOMI without  ptosis or diploplia.  V: Facial sensation is symmetric to temperature VII: Facial movement is symmetric resting and smiling VIII: Hearing is intact to voice X: Palate elevates symmetrically XI: Shoulder shrug is symmetric. XII: Tongue protrudes midline without atrophy or fasciculations.  Motor: Tone is normal. Bulk is normal.  Elevates bilateral upper and lower extremities antigravity 4/5 bilaterally deltoids otherwise 5/5 throughout UE  Poor effort on hip flexion with attending MD exam (possibly due to discomfort / needing to urinate), but otherwise 5/5 in the LE; 5/5 effort on hip flexion on NP exam Sensory: Sensation is symmetric to light touch in arms Reports pain with movement in right arm/shoulder Bilateral mid calf down reports numbness and pins and needle feeling in feet Length dependent loss of temp sensation in the arms and legs  Deep Tendon Reflexes: Unable to illicit patellar reflex, adductors present, brachioradialis 2+ and biceps 2+ bilaterally  Cerebellar: FNF intact  Gait:  Deferred for safety  Labs/Imaging/Neurodiagnostic studies    Basic Metabolic Panel: Recent Labs  Lab 07/09/23 1431 07/12/23 1139 07/12/23 1515 07/14/23 2020 07/14/23 2041 07/15/23 0143  NA 136 140 136 137 137 138  K 3.1* CANCELED 3.2* 3.4* 5.3* 3.5  CL 104 102 103 104  --  105  CO2 22 CANCELED 16* 13*  --  13*  GLUCOSE 101* 98 78 79  --  69*  BUN 14 43* 15 14  --  16  CREATININE 0.75 CANCELED 0.76 0.94  --  0.95  CALCIUM 9.6 10.9* 9.7 9.6  --  9.5  MG  --   --   --  2.2  --   --     CBC: Recent Labs  Lab 07/12/23 1138 07/12/23 1515 07/14/23 2020 07/14/23 2041 07/15/23 0143  WBC 3.8 5.0 13.3*  --  13.2*  NEUTROABS  --  3.2 11.0*  --   --   HGB 12.2 10.2* 9.5* 10.2* 9.5*  HCT 38.6 31.2* 29.8* 30.0*  30.0*  MCV 92 89.4 92.5  --  91.7  PLT 189 165 171  --  169   Lipid Panel:  Lab Results  Component Value Date   CHOL 176 12/19/2022   HDL 34 (L) 12/19/2022   LDLCALC 110  (H) 12/19/2022   LDLDIRECT 58 08/04/2011   TRIG 182 (H) 12/19/2022   CHOLHDL 5.2 (H) 12/19/2022   HgbA1c:  Lab Results  Component Value Date   HGBA1C 4.6 (L) 07/08/2023   Urine Drug Screen:     Component Value Date/Time   LABOPIA POSITIVE (A) 07/08/2023 0131   COCAINSCRNUR NONE DETECTED 07/08/2023 0131   LABBENZ NONE DETECTED 07/08/2023 0131   AMPHETMU NONE DETECTED 07/08/2023 0131   THCU NONE DETECTED 07/08/2023 0131   LABBARB NONE DETECTED 07/08/2023 0131     Lab Results  Component Value Date   VITAMINB12 449 07/15/2023   VITAMINB12 194 07/08/2023   Lab Results  Component Value Date   CKTOTAL 1,018 (H) 07/14/2023   ESRSEDRATE 117 (H) 07/14/2023   CRP 27.2 (H) 07/14/2023   Lab Results  Component Value Date   PROT 7.4 07/14/2023   ALBUMINELP 4.3 07/10/2023   A1GS 0.4 (H) 07/10/2023   A2GS 0.6 07/10/2023   BETS 0.5 07/10/2023   BETA2SER 0.7 (H) 07/10/2023   SPEI  07/10/2023     Comment:     Normal Serum Protein Electrophoresis Pattern. No abnormal protein bands (M-protein) detected.    Lab Results  Component Value Date   IMMELINT  07/10/2023     Comment:     . Normal pattern. No monoclonal proteins detected. .    IGASERUM 463 (H) 07/10/2023   IGGSERUM 3,058 (H) 07/10/2023   IGMSERUM 110 07/10/2023    Copper  125 (normal) B1 <6 (extremely low) -> pending  RPR - nonreactive HIV neg  05/31/2023- TB Quant negative HCV  ANCA w/ reflex pending Rheumatoid factor <10 TSH 1.25 01/19/2023- Vit D - 5.9  CK 1018, 07/14/2023 increased from 787 on 07/08/2023  MRI Brain(Personally reviewed): Normal MRI appearance of the Brain.   MRI Lumbar Spine w wo Stable and essentially normal Lumbar MRI, without and with contrast.    ASSESSMENT   Tiffany Velasquez is a 28 y.o. female with ocular autoimmune disease as detailed above as well as severe nutritional deficiencies especially thiamine , B12 improving since supplementation started  While she does have an  abnormal SPEP, this is difficult to interpret in the setting of IVIG infusions. Indeed antibody testing in general is challenging in this setting due to reduction of both sensitivity and specificity of testing.  For this reason I will hold off on other serological antibody testing at this time  ESR and CRP are quite high, these are nonspecific markers, however she does seem to have some rhabdomyolysis as well.  Please note that this can explain her elevated AST/ALT.  GGT should be obtained to clarify hepatocellular versus myocyte injury  She is experiencing urinary retention which may also be secondary to opiate use for pain in the last few weeks, however will need to complete imaging of the cord to rule out any compressive contribution to her symptoms especially given her multiple falls.  Will obtain MRI imaging with and without contrast to rule out an inflammatory spinal cord process  Regarding IVIG, this is a relatively low risk immunosuppressive treatment to continue given that it is used as a treatment for patients with, for example, hypogammaglobulinemia.  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  Would screen visual acuity to assess for any acute process  Differential is broad at this time although primarily nutritional etiologies are at the top of my list, will continue to entertain possibility of infectious/inflammatory etiologies as below  RECOMMENDATIONS  - High dose thiamine  replacement noting that deficiency has been at times associated with rhabdomyolysis - GGT  - Trend CK - Vitamin A , E, D, K noting that vitamin D  can be associated with rhabdomyolysis and E can be associated with myelopathy, and to establish other baseline nutritional deficiencies that may need to be addressed - MRI thoracic and MRI cervical spine with  and without contrast - Initiation of multivitamin - B12 injections for 7 days to rapidly increase level - Convert p.o. folate to IV for better absorption - Lidocaine  gel to improve pain control in the feet - Okay to continue IVIG at this time as discussed above - Appreciate management of gastritis and other comorbidities per primary team - Hold colchicine  - Visual acuity screening order placed  - Neurology will follow ______________________________________________________________________  Examined with assistance of nurse practitioner Clydia Dart  Signed, Baldwin Levee MD-PhD [facility] [phone] Available 7 AM to 7 PM, outside these hours please contact Neurologist on call listed on AMION

## 2023-07-16 ENCOUNTER — Inpatient Hospital Stay (HOSPITAL_COMMUNITY)

## 2023-07-16 ENCOUNTER — Encounter (HOSPITAL_COMMUNITY): Payer: Self-pay | Admitting: Family Medicine

## 2023-07-16 DIAGNOSIS — E889 Metabolic disorder, unspecified: Secondary | ICD-10-CM | POA: Diagnosis present

## 2023-07-16 DIAGNOSIS — E538 Deficiency of other specified B group vitamins: Secondary | ICD-10-CM | POA: Diagnosis not present

## 2023-07-16 DIAGNOSIS — E860 Dehydration: Secondary | ICD-10-CM | POA: Diagnosis not present

## 2023-07-16 DIAGNOSIS — R1114 Bilious vomiting: Secondary | ICD-10-CM | POA: Diagnosis not present

## 2023-07-16 DIAGNOSIS — R7982 Elevated C-reactive protein (CRP): Secondary | ICD-10-CM | POA: Diagnosis not present

## 2023-07-16 DIAGNOSIS — G63 Polyneuropathy in diseases classified elsewhere: Secondary | ICD-10-CM

## 2023-07-16 DIAGNOSIS — E5111 Dry beriberi: Secondary | ICD-10-CM | POA: Diagnosis present

## 2023-07-16 DIAGNOSIS — D84821 Immunodeficiency due to drugs: Secondary | ICD-10-CM | POA: Diagnosis not present

## 2023-07-16 DIAGNOSIS — M79672 Pain in left foot: Secondary | ICD-10-CM

## 2023-07-16 DIAGNOSIS — R634 Abnormal weight loss: Secondary | ICD-10-CM | POA: Diagnosis present

## 2023-07-16 DIAGNOSIS — R339 Retention of urine, unspecified: Secondary | ICD-10-CM

## 2023-07-16 DIAGNOSIS — D649 Anemia, unspecified: Secondary | ICD-10-CM | POA: Diagnosis present

## 2023-07-16 DIAGNOSIS — R1110 Vomiting, unspecified: Secondary | ICD-10-CM | POA: Diagnosis not present

## 2023-07-16 LAB — BASIC METABOLIC PANEL WITH GFR
Anion gap: 13 (ref 5–15)
BUN: 14 mg/dL (ref 6–20)
CO2: 22 mmol/L (ref 22–32)
Calcium: 9 mg/dL (ref 8.9–10.3)
Chloride: 101 mmol/L (ref 98–111)
Creatinine, Ser: 0.66 mg/dL (ref 0.44–1.00)
GFR, Estimated: >60 mL/min (ref >60)
Glucose, Bld: 81 mg/dL (ref 70–99)
Potassium: 3.4 mmol/L — ABNORMAL LOW (ref 3.5–5.1)
Sodium: 136 mmol/L (ref 135–145)

## 2023-07-16 LAB — HEPATITIS PANEL, ACUTE
HCV Ab: NONREACTIVE
Hep A IgM: NONREACTIVE
Hep B C IgM: NONREACTIVE
Hepatitis B Surface Ag: NONREACTIVE

## 2023-07-16 LAB — PROTIME-INR
INR: 1.2 (ref 0.8–1.2)
Prothrombin Time: 15.3 s — ABNORMAL HIGH (ref 11.4–15.2)

## 2023-07-16 LAB — CBC
HCT: 27.7 % — ABNORMAL LOW (ref 36.0–46.0)
Hemoglobin: 8.7 g/dL — ABNORMAL LOW (ref 12.0–15.0)
MCH: 29.2 pg (ref 26.0–34.0)
MCHC: 31.4 g/dL (ref 30.0–36.0)
MCV: 93 fL (ref 80.0–100.0)
Platelets: 176 10*3/uL (ref 150–400)
RBC: 2.98 MIL/uL — ABNORMAL LOW (ref 3.87–5.11)
RDW: 23.7 % — ABNORMAL HIGH (ref 11.5–15.5)
WBC: 8.8 10*3/uL (ref 4.0–10.5)
nRBC: 0.6 % — ABNORMAL HIGH (ref 0.0–0.2)

## 2023-07-16 LAB — IRON AND TIBC
Iron: 46 ug/dL (ref 28–170)
Saturation Ratios: 27 % (ref 10.4–31.8)
TIBC: 172 ug/dL — ABNORMAL LOW (ref 250–450)
UIBC: 126 ug/dL

## 2023-07-16 LAB — RETIC PANEL
Immature Retic Fract: 8.8 % (ref 2.3–15.9)
RBC.: 2.94 MIL/uL — ABNORMAL LOW (ref 3.87–5.11)
Retic Count, Absolute: 16.8 10*3/uL — ABNORMAL LOW (ref 19.0–186.0)
Retic Ct Pct: 0.6 % (ref 0.4–3.1)
Reticulocyte Hemoglobin: 30.4 pg (ref >27.9)

## 2023-07-16 LAB — HEPATIC FUNCTION PANEL
ALT: 71 U/L — ABNORMAL HIGH (ref 0–44)
AST: 351 U/L — ABNORMAL HIGH (ref 15–41)
Albumin: 2.5 g/dL — ABNORMAL LOW (ref 3.5–5.0)
Alkaline Phosphatase: 51 U/L (ref 38–126)
Bilirubin, Direct: 0.1 mg/dL (ref 0.0–0.2)
Indirect Bilirubin: 1.1 mg/dL — ABNORMAL HIGH (ref 0.3–0.9)
Total Bilirubin: 1.2 mg/dL (ref 0.0–1.2)
Total Protein: 7 g/dL (ref 6.5–8.1)

## 2023-07-16 LAB — GAMMA GT: GGT: 68 U/L — ABNORMAL HIGH (ref 7–50)

## 2023-07-16 LAB — CK: Total CK: 1536 U/L — ABNORMAL HIGH (ref 38–234)

## 2023-07-16 LAB — HEPATITIS B SURFACE ANTIBODY,QUALITATIVE: Hep B S Ab: REACTIVE — AB

## 2023-07-16 LAB — PHOSPHORUS: Phosphorus: 1.5 mg/dL — ABNORMAL LOW (ref 2.5–4.6)

## 2023-07-16 LAB — HEPATITIS B SURFACE ANTIGEN: Hepatitis B Surface Ag: NONREACTIVE

## 2023-07-16 MED ORDER — LACTATED RINGERS IV SOLN: INTRAVENOUS | Status: DC

## 2023-07-16 MED ORDER — LACTATED RINGERS IV SOLN: INTRAVENOUS | Status: AC

## 2023-07-16 MED ORDER — POTASSIUM CHLORIDE CRYS ER 20 MEQ PO TBCR
40.0000 meq | EXTENDED_RELEASE_TABLET | Freq: Once | ORAL | Status: AC
  Administered 2023-07-16: 40 meq via ORAL
  Filled 2023-07-16: qty 2

## 2023-07-16 MED ORDER — POTASSIUM PHOSPHATES 15 MMOLE/5ML IV SOLN
30.0000 mmol | Freq: Once | INTRAVENOUS | Status: AC
  Administered 2023-07-16: 30 mmol via INTRAVENOUS
  Filled 2023-07-16: qty 10

## 2023-07-16 MED ORDER — GADOBUTROL 1 MMOL/ML IV SOLN
10.0000 mL | Freq: Once | INTRAVENOUS | Status: AC | PRN
Start: 2023-07-16 — End: 2023-07-16
  Administered 2023-07-16: 10 mL via INTRAVENOUS

## 2023-07-16 MED ORDER — ORAL CARE MOUTH RINSE: 15.0000 mL | OROMUCOSAL | Status: DC | PRN

## 2023-07-16 NOTE — Progress Notes (Signed)
 Patient unable to sit on the side of bed due to weakness to [facility]. She also unable to use the bedpan due to habitus. Her legs are too weak to do anything. Convinced patient to use the purwick at present. Will continue to monitor.

## 2023-07-16 NOTE — Assessment & Plan Note (Addendum)
 AST and ALT elevated on admission. AST/ALT ratio of ~3. GGT also elevated. Denies any alcohol use. - Will obtain RUQ ultrasound

## 2023-07-16 NOTE — Progress Notes (Signed)
 Physical Therapy Treatment Patient Details Name: Tiffany Velasquez MRN: [id number] DOB: [birth year] Today's Date: 07/16/2023   History of Present Illness Pt is 28 y.o. female presenting 07/14/23 with 2 months of progressive weakness. Pt has fallen 6 times in the past 2 weeks. She was recently admitted 4/11 for generalized weakness as well; found to have significant nutritional deficiencies (including B12) in the setting of an aggressive weight loss program;  PMH asthma, glaucoma, panuveitis, HTN, anemia and peripheral neuropathy, obesity.    PT Comments  Continuing work on functional mobility and activity tolerance;  Very fatigued after taking time on the [facility]; After 3 attemtps at sit to stand to the stedy, had to opt for the [facility] for safe transfer back to bed; Happy to report that placing the [facility] pad was not extremely complicated, and we were able to safely assist pt back to bed with 2 person assist    If plan is discharge home, recommend the following: Two people to help with walking and/or transfers;Two people to help with bathing/dressing/bathroom   Can travel by private vehicle        Equipment Recommendations  Rolling walker (2 wheels);Wheelchair (measurements PT);Wheelchair cushion (measurements PT);Other (comment) (drop arm BSC, possibly sliding board)    Recommendations for Other Services Rehab consult     Precautions / Restrictions Precautions Precautions: Fall Recall of Precautions/Restrictions: Intact Precaution/Restrictions Comments: 6 falls recently, FOF     Mobility  Bed Mobility Overal bed mobility: Needs Assistance Bed Mobility: Rolling, Sidelying to Sit, Sit to Supine Rolling: Supervision Sidelying to sit: Mod assist       General bed mobility comments: Mod A to raise trunk, pt able to roll into sidelying without physical assist.    Transfers Overall transfer level: Needs assistance Equipment used: Ambulation equipment used Transfers: Sit to/from Stand,  Bed to chair/wheelchair/BSC Sit to Stand: +2 physical assistance, Max assist, Total assist           General transfer comment: 3 attempts to stand from [facility] to stedy with 2 person assist; unable to rise; Used Maximove lift for safe transfer from [facility] back to bed Transfer via Lift Equipment: Veronica Gordon  Ambulation/Gait                   Stairs             Wheelchair Mobility     Tilt Bed    Modified Rankin (Stroke Patients Only)       Balance     Sitting balance-Leahy Scale: Fair       Standing balance-Leahy Scale: Zero                              Communication Communication Communication: No apparent difficulties  Cognition Arousal: Alert Behavior During Therapy: WFL for tasks assessed/performed                           PT - Cognition Comments: Pt had periods of lability as she would tear up and would report being afraid she was going to fall. Following commands: Intact      Cueing Cueing Techniques: Verbal cues  Exercises      General Comments        Pertinent Vitals/Pain Pain Assessment Pain Assessment: Faces Faces Pain Scale: Hurts a little bit Pain Location: Generalized; uncomfortable; needs to void Pain Descriptors / Indicators: Discomfort, Sharp Pain Intervention(s): Limited  activity within patient's tolerance    Home Living                          Prior Function            PT Goals (current goals can now be found in the care plan section) Acute Rehab PT Goals Patient Stated Goal: to walk PT Goal Formulation: With patient Time For Goal Achievement: 07/30/23 Potential to Achieve Goals: Fair Progress towards PT goals: Progressing toward goals (Very slowly)    Frequency    Min 2X/week      PT Plan      Co-evaluation              AM-PAC PT "6 Clicks" Mobility   Outcome Measure  Help needed turning from your back to your side while in a flat bed without using  bedrails?: None Help needed moving from lying on your back to sitting on the side of a flat bed without using bedrails?: A Lot Help needed moving to and from a bed to a chair (including a wheelchair)?: Total Help needed standing up from a chair using your arms (e.g., wheelchair or bedside chair)?: Total Help needed to walk in hospital room?: Total Help needed climbing 3-5 steps with a railing? : Total 6 Click Score: 10    End of Session   Activity Tolerance: Patient tolerated treatment well Patient left: in bed;with call bell/phone within reach;with nursing/sitter in room Nurse Communication: Mobility status PT Visit Diagnosis: Unsteadiness on feet (R26.81);Other abnormalities of gait and mobility (R26.89);Repeated falls (R29.6);Muscle weakness (generalized) (M62.81);History of falling (Z91.81);Difficulty in walking, not elsewhere classified (R26.2);Other symptoms and signs involving the nervous system (R29.898)     Time: 1124-1140 PT Time Calculation (min) (ACUTE ONLY): 16 min  Charges:    $Therapeutic Activity: 8-22 mins PT General Charges $$ ACUTE PT VISIT: 1 Visit                     Darcus Eastern, PT  Acute Rehabilitation Services Office [phone] Secure Chat welcomed    Marcial Setting 07/16/2023, 5:35 PM

## 2023-07-16 NOTE — Evaluation (Signed)
 Physical Therapy Evaluation Patient Details Name: Tiffany Velasquez MRN: [id number] DOB: 1995/05/25 Today's Date: 07/16/2023  History of Present Illness  Pt is 28 y.o. female presenting 07/14/23 with 2 months of progressive weakness. Pt has fallen 6 times in the past 2 weeks. She was recently admitted 4/11 for generalized weakness as well; found to have significant nutritional deficiencies (including B12) in the setting of an aggressive weight loss program;  PMH asthma, glaucoma, panuveitis, HTN, anemia and peripheral neuropathy, obesity.  Clinical Impression   Pt admitted with above diagnosis. Lives at home with her family, in a single-level home with 4 steps to enter; Prior to admission, pt was able to walk short distances, manage getting up and down steps with family assist, since previous admission; Presents to PT with generalized weakness, functional dependencies, a decline form PLOF;  Today, pt needed Supervision to roll R, mod assist to sit up, and 2 person heavy mod assist to stand to stedy for transfer to [facility]; Anticipate will need post-acute rehab to maximize independence and safety with mobility; Pt currently with functional limitations due to the deficits listed below (see PT Problem List). Pt will benefit from skilled PT to increase their independence and safety with mobility to allow discharge to the venue listed below.           If plan is discharge home, recommend the following: Two people to help with walking and/or transfers;Two people to help with bathing/dressing/bathroom   Can travel by private vehicle        Equipment Recommendations Rolling walker (2 wheels);Wheelchair (measurements PT);Wheelchair cushion (measurements PT);Other (comment) (drop arm BSC, possibly sliding board)  Recommendations for Other Services  OT consult    Functional Status Assessment Patient has had a recent decline in their functional status and demonstrates the ability to make significant improvements  in function in a reasonable and predictable amount of time.     Precautions / Restrictions Precautions Precautions: Fall Recall of Precautions/Restrictions: Intact Precaution/Restrictions Comments: 6 falls recently, FOF Restrictions Weight Bearing Restrictions Per Provider Order: No      Mobility  Bed Mobility Overal bed mobility: Needs Assistance Bed Mobility: Rolling, Sidelying to Sit, Sit to Supine Rolling: Supervision Sidelying to sit: Mod assist       General bed mobility comments: Mod A to raise trunk, pt able to roll into sidelying without physical assist.    Transfers Overall transfer level: Needs assistance Equipment used: Ambulation equipment used Transfers: Sit to/from Stand Sit to Stand: +2 physical assistance, Mod assist           General transfer comment: Heavy mod assist of 2 to stand to stedy Transfer via Lift Equipment: Stedy  Ambulation/Gait                  Stairs            Wheelchair Mobility     Tilt Bed    Modified Rankin (Stroke Patients Only)       Balance     Sitting balance-Leahy Scale: Fair       Standing balance-Leahy Scale: Zero                               Pertinent Vitals/Pain Pain Assessment Pain Assessment: Faces Faces Pain Scale: Hurts a little bit Pain Location: Generalized; uncomfortable; needs to void Pain Descriptors / Indicators: Discomfort, Sharp Pain Intervention(s): Monitored during session    Home Living Family/patient expects  to be discharged to:: Private residence Living Arrangements: Parent Available Help at Discharge: Family;Available 24 hours/day Type of Home: House Home Access: Stairs to enter Entrance Stairs-Rails: Right (brothers help her on the other side) Entrance Stairs-Number of Steps: 4   Home Layout: One level Home Equipment: Agricultural consultant (2 wheels);BSC/3in1      Prior Function Prior Level of Function : Needs assist             Mobility  Comments: uses RW for longer distances in the house, otherwise furniture/wall walks, brothers help pt rise into standing until steady then hand her RW ADLs Comments: family helps her in and out of tub to shower seat and helps her get washed up, she dresses herself, A for toileting.     Extremity/Trunk Assessment   Upper Extremity Assessment Upper Extremity Assessment: Defer to OT evaluation    Lower Extremity Assessment Lower Extremity Assessment: Generalized weakness RLE Deficits / Details: Able to actively flex/extend hip and knee RLE Sensation: history of peripheral neuropathy LLE Deficits / Details: actively felx/extend hips and knees    Cervical / Trunk Assessment Cervical / Trunk Assessment: Kyphotic;Other exceptions Cervical / Trunk Exceptions: poor control of cervical, strong flexion  Communication   Communication Communication: No apparent difficulties    Cognition Arousal: Alert Behavior During Therapy: WFL for tasks assessed/performed                           PT - Cognition Comments: Pt had periods of lability as she would tear up and would report being afraid she was going to fall. Following commands: Intact       Cueing Cueing Techniques: Verbal cues     General Comments General comments (skin integrity, edema, etc.): Pt able to void once on [facility]    Exercises     Assessment/Plan    PT Assessment Patient needs continued PT services  PT Problem List Decreased strength;Decreased range of motion;Decreased activity tolerance;Decreased balance;Decreased mobility;Decreased coordination;Decreased knowledge of use of DME;Decreased safety awareness;Cardiopulmonary status limiting activity;Impaired sensation;Pain;Obesity       PT Treatment Interventions DME instruction;Stair training;Gait training;Functional mobility training;Therapeutic activities;Therapeutic exercise;Balance training;Neuromuscular re-education;Patient/family education;Wheelchair  mobility training;Modalities    PT Goals (Current goals can be found in the Care Plan section)  Acute Rehab PT Goals Patient Stated Goal: to walk PT Goal Formulation: With patient Time For Goal Achievement: 07/30/23 Potential to Achieve Goals: Fair    Frequency Min 2X/week     Co-evaluation               AM-PAC PT "6 Clicks" Mobility  Outcome Measure Help needed turning from your back to your side while in a flat bed without using bedrails?: None Help needed moving from lying on your back to sitting on the side of a flat bed without using bedrails?: A Lot Help needed moving to and from a bed to a chair (including a wheelchair)?: Total Help needed standing up from a chair using your arms (e.g., wheelchair or bedside chair)?: Total Help needed to walk in hospital room?: Total Help needed climbing 3-5 steps with a railing? : Total 6 Click Score: 10    End of Session Equipment Utilized During Treatment: Gait belt Activity Tolerance: Patient tolerated treatment well Patient left: with call bell/phone within reach;Other (comment) (On wide drop-arm BSC) Nurse Communication: Mobility status PT Visit Diagnosis: Unsteadiness on feet (R26.81);Other abnormalities of gait and mobility (R26.89);Repeated falls (R29.6);Muscle weakness (generalized) (M62.81);History of falling (Z91.81);Difficulty  in walking, not elsewhere classified (R26.2);Other symptoms and signs involving the nervous system (R29.898)    Time: 6045-4098 PT Time Calculation (min) (ACUTE ONLY): 25 min   Charges:   PT Evaluation $PT Eval Moderate Complexity: 1 Mod PT Treatments $Therapeutic Activity: 8-22 mins PT General Charges $$ ACUTE PT VISIT: 1 Visit         Darcus Eastern, PT  Acute Rehabilitation Services Office [phone] Secure Chat welcomed   Marcial Setting 07/16/2023, 1:19 PM

## 2023-07-16 NOTE — Assessment & Plan Note (Addendum)
 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 (see MAR) Retinal edema: Continue acetazolamide  250 mg twice daily Gout: Continue allopurinol  100 mg daily, holding colchicine  as can contribute to peripheral neuropathy GERD: Continue famotidine  40 mg daily, pantoprazole  40 mg daily (substitute for Dexlansoprazole ) Asthma: Continue Dulera  2 puffs twice daily (substitute for Symbicort ), albuterol  nebulizer PRN, montelukast  10 mg daily Neuropathy: Increase home duloxetine  to 60 mg daily, getting B12 injections for 6 days, then transition to oral,  gabapentin  600 mg TID HTN: continue home olmesartan -amlodipine -hydrochlorothiazide  20-5-12.5 mg daily

## 2023-07-16 NOTE — Assessment & Plan Note (Addendum)
-   continue IVF - Gastric emptying study  - zofran  prn - AM CMP, replete electrolytes prn  - RD consult  - will consider GI consult

## 2023-07-16 NOTE — Assessment & Plan Note (Signed)
>>  ASSESSMENT AND PLAN FOR TRANSAMINITIS WRITTEN ON 07/16/2023 11:37 AM BY BALOCH, MAHNOOR, MD  AST and ALT elevated on admission. AST/ALT ratio of ~3. GGT also elevated. Denies any alcohol use. - Will obtain RUQ ultrasound

## 2023-07-16 NOTE — Assessment & Plan Note (Addendum)
 Persistent on exam today.  - Echo ordered  - CCM  - continue IVF rehydration with LR 100mL/hr - consider DVT/ CT PE if continues

## 2023-07-16 NOTE — Plan of Care (Signed)
   Problem: Education: Goal: Knowledge of General Education information will improve Description Including pain rating scale, medication(s)/side effects and non-pharmacologic comfort measures Outcome: Progressing

## 2023-07-16 NOTE — Assessment & Plan Note (Addendum)
 Hg down from 9.5 to 8.7 this AM. Of note, other cell lines also down. May be dilutional from IVF resuscitation. Reticulocyte count also low. - Iron panel with reticulocyte pending - AM CBC

## 2023-07-16 NOTE — Assessment & Plan Note (Signed)
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  Per neurology recs yesterday, antibody testing is challenging in setting of IVIG infusions.  Will hold off on geological antibody testing at this time.  GGT elevated to 68, though alkaline phosphatase remains normal. Will add on HFP to lab work from this AM to trend AST/ALT. CK increased today, will continue to trend. Awaiting MR cervical spine and MR thoracic today. Awaiting Echo. Vit D low, awaiting Vitamins A, E, K. Humara may also be contributing.  - Neurology following, appreciate recs - Continue B12 injections, IV folate, IV thiamine  and MV - Continue Vit D supplemenation - Anti-jo pending to work up autoimmune myositis  - Visual acuity testing ordered, pending  - MRI cervical spine/thoracic pending (obtained due to urinary retention)  - CCM - PT/OT to treat - continue LR IVF @ 100 mL/hr  - AM CBC, CMP  - Continue to trend CK

## 2023-07-16 NOTE — Assessment & Plan Note (Addendum)
 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  Per neurology recs yesterday, antibody testing is challenging in setting of IVIG infusions.  Will hold off on geological antibody testing at this time.  GGT elevated to 68, though alkaline phosphatase remains normal. Will add on HFP to lab work from this AM to trend AST/ALT. CK increased today, will continue to trend. Awaiting MR cervical spine and MR thoracic today. Awaiting Echo. Vit D low, awaiting Vitamins A, E, K. Humara may also be contributing.  - Neurology following, appreciate recs - Continue B12 injections, IV folate, IV thiamine  and MV - Continue Vit D supplemenation - Anti-jo pending to work up autoimmune myositis  - Visual acuity testing ordered, pending  - MRI cervical spine/thoracic pending (obtained due to urinary retention)  - CCM - PT/OT to treat - continue LR IVF @ 100 mL/hr  - AM CBC, CMP  - Continue to trend CK

## 2023-07-16 NOTE — Plan of Care (Signed)

## 2023-07-16 NOTE — Assessment & Plan Note (Signed)
 Bladder scan overnight with 450 mL, in and out cath returned 350 mL. Patient is now s/p 2 I/O caths. May be 2/2 to opiate use.  - Bladder scans q8h - RVP - MRI cervical/thoracic pending

## 2023-07-16 NOTE — Progress Notes (Signed)
 NEUROLOGY CONSULT FOLLOW UP NOTE   Date of service: July 16, 2023 Patient Name: Tiffany Velasquez MRN:  [id number] DOB:  08-30-1995  Interval Hx/subjective   Seen in room with no family at the bedside. Concerned about missing classes (letter sent for her to email her teachers via MyChart). Was able to work with OT yesterday and is hopeful to be able to use a bedside commode today. Required two straight caths overnight. Exam today consistent with yesterdays Vitals   Vitals:   07/15/23 2029 07/15/23 2034 07/16/23 0500 07/16/23 0725  BP: 111/81  131/80 (!) 162/95  Pulse: (!) 122  (!) 115 (!) 121  Resp: 18  18   Temp: 98.7 F (37.1 C)  98.7 F (37.1 C)   TempSrc: Oral  Oral   SpO2: 95% 98% 97% 97%  Weight:      Height:         Body mass index is 48.31 kg/m.  Physical Exam   Constitutional: Appears more comfortable today  Psych: Affect appropriate to situation.  Eyes: No scleral injection.  HENT: No OP obstrucion.  Cardiovascular: Normal rate and regular rhythm.  Respiratory: Effort normal, non-labored breathing.    Neurologic Examination   Neuro: Mental Status: Patient is awake, alert, oriented to person, place, month, year, and situation. Patient is able to give a clear and coherent history. No signs of aphasia or neglect Cranial Nerves: II: Visual Fields are full. Left pupil mildly ovoid ~2 mm, nonreactive, right is 4 mm sluggishily reactive to 2  III,IV, VI: EOMI with mild bilateral ptosis, no diploplia. Visual acuity grossly intact. Endorses blurry vision on the right with monocular vision but still able to identify fingers  V: Facial sensation is symmetric to temperature VII: Facial movement is symmetric resting and smiling VIII: Hearing is intact to voice X: Palate elevates symmetrically XI: Shoulder shrug is symmetric. XII: Tongue protrudes midline without atrophy or fasciculations.  Motor: Tone is normal. Bulk is normal.  Elevates bilateral upper and lower  extremities antigravity 4/5 bilaterally deltoids otherwise 5/5 throughout UE  Poor effort in lower exam, endorses shooting pain in right toes up the leg today when elevating legs. but otherwise 5/5 in the LE; 4+/5 effort on hip flexion on NP exam Sensory: Sensation is symmetric to light touch in arms Reports pain with movement in right arm/shoulder Bilateral mid calf down reports numbness and pins and needle feeling in feet Length dependent loss of temp sensation in the arms and legs  Deep Tendon Reflexes 4/19: Unable to illicit patellar reflex, adductors present, brachioradialis 2+ and biceps 2+ bilaterally  Cerebellar: FNF intact  Gait:  Deferred for safety    Medications  Current Facility-Administered Medications:    albuterol  (PROVENTIL ) (2.5 MG/3ML) 0.083% nebulizer solution 2.5 mg, 2.5 mg, Nebulization, Q6H PRN, Spence, Sarah, DO   allopurinol  (ZYLOPRIM ) tablet 100 mg, 100 mg, Oral, Daily, Spence, Sarah, DO, 100 mg at 07/16/23 0750   azelastine  (ASTELIN ) 0.1 % nasal spray 2 spray, 2 spray, Each Nare, BID, Omar Bibber, DO, 2 spray at 07/16/23 7829   brimonidine  (ALPHAGAN ) 0.2 % ophthalmic solution 1 drop, 1 drop, Both Eyes, TID, Spence, Sarah, DO, 1 drop at 07/16/23 5621   Chlorhexidine  Gluconate Cloth 2 % PADS 6 each, 6 each, Topical, Daily, McDiarmid, Demetra Filter, MD, 6 each at 07/15/23 0841   cyanocobalamin  (VITAMIN B12) injection 1,000 mcg, 1,000 mcg, Intramuscular, Daily, 1,000 mcg at 07/16/23 0748 **FOLLOWED BY** [START ON 07/22/2023] cyanocobalamin  (VITAMIN B12) tablet 1,000 mcg, 1,000  mcg, Oral, Daily, Annaliese Saez L, MD   dorzolamide -timolol  (COSOPT ) 2-0.5 % ophthalmic solution 1 drop, 1 drop, Both Eyes, BID, Spence, Sarah, DO, 1 drop at 07/16/23 6045   DULoxetine  (CYMBALTA ) DR capsule 30 mg, 30 mg, Oral, Daily, Omar Bibber, DO, 30 mg at 07/16/23 0751   enoxaparin  (LOVENOX ) injection 70 mg, 70 mg, Subcutaneous, Q24H, Spence, Sarah, DO, 70 mg at 07/16/23 4098   famotidine   (PEPCID ) tablet 40 mg, 40 mg, Oral, QHS, Spence, Sarah, DO, 40 mg at 07/15/23 2154   folic acid  injection 1 mg, 1 mg, Intravenous, Daily, McDiarmid, Demetra Filter, MD, 1 mg at 07/16/23 1191   gabapentin  (NEURONTIN ) capsule 600 mg, 600 mg, Oral, TID, Spence, Sarah, DO, 600 mg at 07/16/23 4782   irbesartan  (AVAPRO ) tablet 150 mg, 150 mg, Oral, Daily, 150 mg at 07/16/23 0751 **AND** hydrochlorothiazide  (HYDRODIURIL ) tablet 12.5 mg, 12.5 mg, Oral, Daily, McDiarmid, Demetra Filter, MD, 12.5 mg at 07/16/23 0750   latanoprost  (XALATAN ) 0.005 % ophthalmic solution 1 drop, 1 drop, Both Eyes, QHS, Spence, Sarah, DO, 1 drop at 07/15/23 2157   lidocaine  (LMX) 4 % cream, , Topical, TID, Kaleea Penner L, MD, Given at 07/16/23 9562   mometasone -formoterol  (DULERA ) 200-5 MCG/ACT inhaler 2 puff, 2 puff, Inhalation, BID, Omar Bibber, DO, 2 puff at 07/16/23 1308   montelukast  (SINGULAIR ) tablet 10 mg, 10 mg, Oral, QHS, Spence, Sarah, DO, 10 mg at 07/15/23 2154   multivitamin with minerals tablet 1 tablet, 1 tablet, Oral, Daily, Kmya Placide L, MD, 1 tablet at 07/16/23 0750   ondansetron  (ZOFRAN -ODT) disintegrating tablet 4 mg, 4 mg, Oral, Q8H PRN, Omar Bibber, DO, 4 mg at 07/15/23 6578   Oral care mouth rinse, 15 mL, Mouth Rinse, PRN, McDiarmid, Demetra Filter, MD   pantoprazole  (PROTONIX ) EC tablet 40 mg, 40 mg, Oral, Q0600, McDiarmid, Demetra Filter, MD, 40 mg at 07/16/23 0537   polyethylene glycol (MIRALAX  / GLYCOLAX ) packet 17 g, 17 g, Oral, Daily, Omar Bibber, DO, 17 g at 07/16/23 4696   sodium chloride  flush (NS) 0.9 % injection 10-40 mL, 10-40 mL, Intracatheter, PRN, McDiarmid, Demetra Filter, MD   thiamine  (VITAMIN B1) 500 mg in sodium chloride  0.9 % 50 mL IVPB, 500 mg, Intravenous, Q8H, Last Rate: 110 mL/hr at 07/16/23 0536, 500 mg at 07/16/23 0536 **FOLLOWED BY** [START ON 07/18/2023] thiamine  (VITAMIN B1) 250 mg in sodium chloride  0.9 % 50 mL IVPB, 250 mg, Intravenous, Daily **FOLLOWED BY** [START ON 07/24/2023] thiamine  (VITAMIN B1)  injection 100 mg, 100 mg, Intravenous, Daily, Jaymon Dudek L, MD   Vitamin D  (Ergocalciferol ) (DRISDOL ) 1.25 MG (50000 UNIT) capsule 50,000 Units, 50,000 Units, Oral, Q7 days, Dema Filler, MD, 50,000 Units at 07/15/23 1822  Labs and Diagnostic Imaging   CBC:  Recent Labs  Lab 07/12/23 1515 07/14/23 2020 07/14/23 2041 07/15/23 0143 07/16/23 0200  WBC 5.0 13.3*  --  13.2* 8.8  NEUTROABS 3.2 11.0*  --   --   --   HGB 10.2* 9.5*   < > 9.5* 8.7*  HCT 31.2* 29.8*   < > 30.0* 27.7*  MCV 89.4 92.5  --  91.7 93.0  PLT 165 171  --  169 176   < > = values in this interval not displayed.    Basic Metabolic Panel:  Lab Results  Component Value Date   NA 136 07/16/2023   K 3.4 (L) 07/16/2023   CO2 22 07/16/2023   GLUCOSE 81 07/16/2023   BUN 14 07/16/2023   CREATININE 0.66 07/16/2023  CALCIUM  9.0 07/16/2023   GFRNONAA >60 07/16/2023   GFRAA >60 05/09/2019   Lipid Panel:  Lab Results  Component Value Date   LDLCALC 110 (H) 12/19/2022   HgbA1c:  Lab Results  Component Value Date   HGBA1C 4.6 (L) 07/08/2023   Urine Drug Screen:     Component Value Date/Time   LABOPIA POSITIVE (A) 07/08/2023 0131   COCAINSCRNUR NONE DETECTED 07/08/2023 0131   LABBENZ NONE DETECTED 07/08/2023 0131   AMPHETMU NONE DETECTED 07/08/2023 0131   THCU NONE DETECTED 07/08/2023 0131   LABBARB NONE DETECTED 07/08/2023 0131   Lab Results  Component Value Date   VITAMINB12 449 07/15/2023   CKTOTAL 1,536 (H) 07/16/2023   ESRSEDRATE 117 (H) 07/14/2023   CRP 27.2 (H) 07/14/2023    4/14- Protein Electrophoresis Lab Results  Component Value Date   PROT 7.4 07/14/2023   ALBUMINELP 4.3 07/10/2023   A1GS 0.4 (H) 07/10/2023   A2GS 0.6 07/10/2023   BETS 0.5 07/10/2023   BETA2SER 0.7 (H) 07/10/2023   SPEI  07/10/2023     Comment:     Normal Serum Protein Electrophoresis Pattern. No abnormal protein bands (M-protein) detected.     4/14- Immunofixation Electrophoresis Lab Results  Component  Value Date   IMMELINT  07/10/2023     Comment:     . Normal pattern. No monoclonal proteins detected. .    IGASERUM 463 (H) 07/10/2023   IGGSERUM 3,058 (H) 07/10/2023   Copper  125 (normal) B1 <6 (extremely low) -> pending  RPR - nonreactive HIV neg   05/31/2023- TB Quant negative  HCV reflex- pending  Hep B S Ab- reactive Hep B S An- non reactive Hep B core Ab- pending   ANCA w/ reflex- pending Rheumatoid factor <10 TSH 1.25 Vitamin D - 6.17 (low)  Vitamin A  -pending  Vitamin E-Pending  Vitamin K1  -Pending  Anti-Jo Antibody- Pending  MMA- Pending   Lab Results  Component Value Date   GGT 68 (H) 07/16/2023   CKTOTAL 1,536 (H) 07/16/2023   CKTOTAL 1,018 (H) 07/14/2023   CKTOTAL 787 (H) 07/08/2023   MRI Brain(Personally reviewed): Normal MRI appearance of the Brain.    MRI Lumbar Spine w wo Stable and essentially normal Lumbar MRI, without and with contrast.   MRI Cervical Spine: Pending    MRI Thoracic Spine: Pending   MRI Thigh Ordered as below, pending  Assessment   Tiffany Velasquez is a 28 y.o. 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    While she does have an abnormal SPEP, this is difficult to interpret in the setting of IVIG infusions. Indeed antibody testing in general is challenging in this setting due to reduction of both sensitivity and specificity of testing.  For this reason I will hold off on other serological antibody testing at this time, and suggest cautious interpretation of the anti-Jo1 antibody testing that is pending   ESR and CRP are quite high, these are nonspecific markers, however she does seem to have some rhabdomyolysis as well.  In autoimmune diseases, low vitamin D  levels are associated with increased levels of inflammatory markers, but will need to continue to assess for potential autoimmune component of her  presentation.   While elevated CK due to myocyte injury can explain her elevated AST/ALT, GGT indicates there is also some hepatocellular injury -- further workup of which I defer to medicine team.   CK increased today to 1536,  and she does have some tenderness on palpation of her thighs so we will also obtain MRI thigh to evaluate for myositis, consider muscle biopsy if positive and if her symptoms are not improving with vitamin supplementation   She is experiencing urinary retention which may also be secondary to opiate use for pain in the last few weeks, however will need to complete imaging of the cord to rule out any compressive contribution to her symptoms especially given her multiple falls.  Will obtain MRI imaging with and without contrast to simultaneously evaluate for any inflammatory spinal cord process.  Reassuringly she was able to void when sitting on bedside commode today   Regarding IVIG, this is a relatively low risk immunosuppressive treatment to continue given that it is used as a treatment for patients with, for example, hypogammaglobulinemia.   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   Differential is broad at this time although primarily nutritional etiologies are at the top of my list, will continue to entertain possibility of infectious/inflammatory etiologies as discussed  Recommendations  - High dose thiamine  replacement noting that deficiency has been at times associated with rhabdomyolysis - Stop duloxetine  as rarely this can contribute to rhabdomyolysis, consider retrial of this medication after acute issues are addressed - Continue B12 injections for 7 days to rapidly increase level, then 1000 mcg daily - Continue IV Folate - Okay to continue IVIG at this time as discussed above - Agree with vitamin  D supplementation - Continue multivitamin - Hold colchicine  - MRI thoracic and MRI cervical spine with and without contrast  - MRI thigh to evaluate for possible myositis - Continue Lidocaine  gel to improve pain control in the feet  - Appreciate management of gastritis and other comorbidities per primary team - PT/OT  - Neurology will follow ______________________________________________________________________   Signed, Imogene Mana, NP [facility]  Attending Neurologist's note:  On my later evaluation the patient had just worked with physical therapy and nursing.  Required 2 person assistance to stand with Abraham Hoffmann steady.  Was able to ambulate to the bedside commode and void when sitting on the commode.  Did have easy fatigability.  She therefore deferred detailed examination with me due to fatigue, but confirmed that she felt that her strength was stable or even possibly slightly improved when she was examined by NP earlier in the day.  She does have some tenderness to palpation of her thighs bilaterally, not present in the calves.  She is also complaining of right Achilles tendon pain which she reports limits her ability to lift her leg up off the bed during my evaluation  I personally saw this patient, gathering history, performing a neurologic examination, reviewing relevant labs, personally reviewing relevant as detailed above, and formulated the assessment and plan, adding to the A/P of the note above for completeness and clarity to accurately reflect my thoughts  Baldwin Levee MD-PhD [facility] [phone] Available 7 AM to 7 PM, outside these hours please contact Neurologist on call listed on AMION

## 2023-07-16 NOTE — Progress Notes (Signed)
 Pt. Unable to void laying down in bed, attempted to get pt. On BSC with Blaine Bump, pt. Unable to pull herself up, pt. Transferred to [facility] via hoyer lift and three assist. Pt. Voided moderate to large amount of urine, unable to measure, pt. Missed urine hat  Elvina Hammers

## 2023-07-16 NOTE — Hospital Course (Addendum)
 Tiffany Velasquez is a 28 y.o.female with a history of multiple vitamin defiencies, focal chorioretinal inflammation, HTN, neuropathy and gastritis who was admitted to the [facility] Medicine Teaching Service at [facility] for weakness and numbness. Her hospital course is detailed below:  Generalized weakness Myositis  New onset 2 months ago and worsening since. Patient hospitalized for same symptoms from 4/11-4/13/25 - thought at that time to be related to mycophenolate. All imaging during that admission was negative. Patient saw [facility] on 4/14 who suspected symptoms related to vitamin deficiencies 2/2 poor PO intake - workup then demonstrated low folate, thiamine , B12, as well as abnormal immunoglobulins A & G. On admission, vit b12 wnl, low folate, TSH wnl, RPR nonreactive, elevated ESR/CRP. MRI lumbar and brain unremarkable. MRI cervical and thoracic spine unremarkable. Neurology consulted who believe symptoms were most likely due to vitamin deficiency in the setting of severe restriction of diet. Patient was treated with vitamin repletion of:  Lab work was significant for elevated CK, which downtrended during admission.  Multifocal PNA Incidental pneumonia findings noted on CT spine. Subsequent CT chest concerning for multifocal pneumonia. Patient remained largely stable on room air, though required supplemental oxygen over one night. Treated with Augmentin  course. At time of discharge, patient remained stable on room air.    Nausea and vomiting Patient reported N/V after recent discharge from hospital. Symptoms improved throughout admission. Patient was treated with IVF and anti-emetics.   Transaminitis Abnormal Liver enzymes  AST and ALT remained stablely elevated during admission with AST/ALT ratio of ~3. GGT also elevated. Patient denies any alcohol use. RUQ US  with gallbladder sludge, otherwise unremarkable.    Other chronic conditions were medically managed with home medications and  formulary alternatives as necessary (Chorioretinal inflammation, panuveitis, retinal edema, gout, GERD, Asthma, Neuropathy, HTN)  PCP Follow-up Recommendations: Low Vit D, started on 50,000U q7d for 8 weeks. Adjust to 800 U when appropriate  CT chest wo CM fu 6 months for infiltrates

## 2023-07-16 NOTE — Progress Notes (Signed)
 Daily Progress Note Intern Pager: [phone]  Patient name: Tiffany Velasquez Medical record number: [id number] Date of birth: Dec 22, 1995 Age: 28 y.o. Gender: female  Primary Care Provider: Wilhemena Harbour, MD Consultants: Neurology Code Status: Full  Pt Overview and Major Events to Date:  4/18: Admitted  Assessment and Plan: Patient is a 28 year old female with past medical history of multiple vitamin deficiencies, abnormal immunoglobulins, focal chorioretinal inflammation, hypertension, neuropathy admitted for progressive weakness, numbness and pupillary defect concerning for myelopathy versus infectious insult.  Assessment & Plan Generalized weakness Patient was started on IVIG infusions for focal chorioretinal inflammation.  Per neurology recs yesterday, antibody testing is challenging in setting of IVIG infusions.  Will hold off on geological antibody testing at this time.  GGT elevated to 68, though alkaline phosphatase remains normal. Will add on HFP to lab work from this AM to trend AST/ALT. CK increased today, will continue to trend. Awaiting MR cervical spine and MR thoracic today. Awaiting Echo. Vit D low, awaiting Vitamins A, E, K. Humara may also be contributing.  - Neurology following, appreciate recs - Continue B12 injections, IV folate, IV thiamine  and MV - Continue Vit D supplemenation - Anti-jo pending to work up autoimmune myositis  - Visual acuity testing ordered, pending  - MRI cervical spine/thoracic pending (obtained due to urinary retention)  - CCM - PT/OT to treat - continue LR IVF @ 100 mL/hr  - AM CBC, CMP  - Continue to trend CK   Nausea & vomiting - continue IVF - Gastric emptying study  - zofran  prn - AM CMP, replete electrolytes prn  - RD consult  - will consider GI consult  Tachycardia Persistent on exam today.  - Echo ordered  - CCM  - continue IVF rehydration with LR 100mL/hr - consider DVT/ CT PE if continues  Anemia Hg down from 9.5 to  8.7 this AM. Of note, other cell lines also down. May be dilutional from IVF resuscitation. Reticulocyte count also low. - Iron panel with reticulocyte pending - AM CBC Urinary retention Bladder scan overnight with 450 mL, in and out cath returned 350 mL. Patient is now s/p 2 I/O caths. May be 2/2 to opiate use.  - Bladder scans q8h - RVP - MRI cervical/thoracic pending Transaminitis AST and ALT elevated on admission. AST/ALT ratio of ~3. GGT also elevated. Denies any alcohol use. 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 (see MAR) Retinal edema: Continue acetazolamide  250 mg twice daily Gout: Continue allopurinol  100 mg daily, holding colchicine  as can contribute to peripheral neuropathy GERD: Continue famotidine  40 mg daily, pantoprazole  40 mg daily (substitute for Dexlansoprazole ) Asthma: Continue Dulera  2 puffs twice daily (substitute for Symbicort ), albuterol  nebulizer PRN, montelukast  10 mg daily Neuropathy: Increase home duloxetine  to 60 mg daily, getting B12 injections for 6 days, then transition to oral,  gabapentin  600 mg TID HTN: continue home olmesartan -amlodipine -hydrochlorothiazide  20-5-12.5 mg daily  FEN/GI: Regular  PPx: Lovenox  Dispo: pending clinical improvement   Subjective:  Complaining of foot pain. Otherwise no complaints. Reports she has urge to pee but it is difficult for her to pee while sitting down.   Objective: Temp:  [98 F (36.7 C)-98.7 F (37.1 C)] 98.7 F (37.1 C) (04/20 0500) Pulse Rate:  [115-125] 121 (04/20 0725) Resp:  [18] 18 (04/20 0500) BP: (111-162)/(75-95) 162/95 (04/20 0725) SpO2:  [95 %-98 %] 97 % (04/20 0725) Physical Exam: General: NAD, in bed Cardiovascular: RRR  Respiratory: NWOB on RA Abdomen: obese, soft, NTND Extremities: no increased warmth to lower extremities noted  Laboratory: Most recent CBC Lab Results  Component Value Date   WBC 8.8 07/16/2023   HGB 8.7  (L) 07/16/2023   HCT 27.7 (L) 07/16/2023   MCV 93.0 07/16/2023   PLT 176 07/16/2023   Most recent BMP    Latest Ref Rng & Units 07/16/2023    2:00 AM  BMP  Glucose 70 - 99 mg/dL 81   BUN 6 - 20 mg/dL 14   Creatinine 2.59 - 1.00 mg/dL 5.63   Sodium 875 - 643 mmol/L 136   Potassium 3.5 - 5.1 mmol/L 3.4   Chloride 98 - 111 mmol/L 101   CO2 22 - 32 mmol/L 22   Calcium 8.9 - 10.3 mg/dL 9.0    CKL 3295 GGT: 68  Vit D: 6.17  Imaging/Diagnostic Tests: No new imaging  Farris Hong, MD 07/16/2023, 8:49 AM  PGY-1, [facility] FPTS Intern pager: [phone], text pages welcome Secure chat group [facility] Teaching Service

## 2023-07-17 ENCOUNTER — Inpatient Hospital Stay (HOSPITAL_COMMUNITY)

## 2023-07-17 DIAGNOSIS — R000 Tachycardia, unspecified: Secondary | ICD-10-CM | POA: Diagnosis not present

## 2023-07-17 DIAGNOSIS — M6282 Rhabdomyolysis: Secondary | ICD-10-CM

## 2023-07-17 DIAGNOSIS — M60859 Other myositis, unspecified thigh: Secondary | ICD-10-CM | POA: Diagnosis not present

## 2023-07-17 DIAGNOSIS — E860 Dehydration: Secondary | ICD-10-CM

## 2023-07-17 DIAGNOSIS — R6889 Other general symptoms and signs: Secondary | ICD-10-CM | POA: Insufficient documentation

## 2023-07-17 DIAGNOSIS — R29898 Other symptoms and signs involving the musculoskeletal system: Secondary | ICD-10-CM | POA: Diagnosis not present

## 2023-07-17 LAB — COMPREHENSIVE METABOLIC PANEL WITH GFR
ALT: 87 U/L — ABNORMAL HIGH (ref 0–44)
AST: 339 U/L — ABNORMAL HIGH (ref 15–41)
Albumin: 2.2 g/dL — ABNORMAL LOW (ref 3.5–5.0)
Alkaline Phosphatase: 61 U/L (ref 38–126)
Anion gap: 10 (ref 5–15)
BUN: 7 mg/dL (ref 6–20)
CO2: 25 mmol/L (ref 22–32)
Calcium: 8.8 mg/dL — ABNORMAL LOW (ref 8.9–10.3)
Chloride: 100 mmol/L (ref 98–111)
Creatinine, Ser: 0.49 mg/dL (ref 0.44–1.00)
GFR, Estimated: >60 mL/min (ref >60)
Glucose, Bld: 89 mg/dL (ref 70–99)
Potassium: 3.5 mmol/L (ref 3.5–5.1)
Sodium: 135 mmol/L (ref 135–145)
Total Bilirubin: 0.8 mg/dL (ref 0.0–1.2)
Total Protein: 6.4 g/dL — ABNORMAL LOW (ref 6.5–8.1)

## 2023-07-17 LAB — RETIC PANEL
Immature Retic Fract: 8.1 % (ref 2.3–15.9)
RBC.: 2.82 MIL/uL — ABNORMAL LOW (ref 3.87–5.11)
Retic Count, Absolute: 13.5 10*3/uL — ABNORMAL LOW (ref 19.0–186.0)
Retic Ct Pct: 0.5 % (ref 0.4–3.1)
Reticulocyte Hemoglobin: 27.7 pg — ABNORMAL LOW (ref >27.9)

## 2023-07-17 LAB — FERRITIN: Ferritin: 791 ng/mL — ABNORMAL HIGH (ref 11–307)

## 2023-07-17 LAB — CBC
HCT: 26.6 % — ABNORMAL LOW (ref 36.0–46.0)
Hemoglobin: 8.3 g/dL — ABNORMAL LOW (ref 12.0–15.0)
MCH: 29.1 pg (ref 26.0–34.0)
MCHC: 31.2 g/dL (ref 30.0–36.0)
MCV: 93.3 fL (ref 80.0–100.0)
Platelets: 203 10*3/uL (ref 150–400)
RBC: 2.85 MIL/uL — ABNORMAL LOW (ref 3.87–5.11)
RDW: 23.3 % — ABNORMAL HIGH (ref 11.5–15.5)
WBC: 7.9 10*3/uL (ref 4.0–10.5)
nRBC: 0.3 % — ABNORMAL HIGH (ref 0.0–0.2)

## 2023-07-17 LAB — ECHOCARDIOGRAM COMPLETE
AR max vel: 2.18 cm2
AV Area VTI: 2.04 cm2
AV Area mean vel: 2.09 cm2
AV Mean grad: 6 mmHg
AV Peak grad: 12 mmHg
Ao pk vel: 1.73 m/s
Est EF: 55
Height: 69 in
S' Lateral: 2.9 cm
Single Plane A4C EF: 55.7 %
Weight: 5234.6 [oz_av]

## 2023-07-17 LAB — PHOSPHORUS: Phosphorus: 2.7 mg/dL (ref 2.5–4.6)

## 2023-07-17 LAB — CK: Total CK: 1250 U/L — ABNORMAL HIGH (ref 38–234)

## 2023-07-17 LAB — MAGNESIUM: Magnesium: 1.6 mg/dL — ABNORMAL LOW (ref 1.7–2.4)

## 2023-07-17 LAB — ANTI-JO 1 ANTIBODY, IGG: Anti JO-1: <0.2 AI (ref 0.0–0.9)

## 2023-07-17 MED ORDER — IBUPROFEN 600 MG PO TABS
600.0000 mg | ORAL_TABLET | Freq: Four times a day (QID) | ORAL | Status: DC | PRN
  Administered 2023-07-17 – 2023-07-20 (×4): 600 mg via ORAL
  Filled 2023-07-17 (×5): qty 1

## 2023-07-17 MED ORDER — IBUPROFEN 200 MG PO TABS
400.0000 mg | ORAL_TABLET | Freq: Once | ORAL | Status: AC
  Administered 2023-07-17: 400 mg via ORAL
  Filled 2023-07-17: qty 2

## 2023-07-17 MED ORDER — IMMUNE GLOBULIN (HUMAN) 10 GM/100ML IV SOLN
80.0000 g | INTRAVENOUS | Status: AC
  Administered 2023-07-17 – 2023-07-20 (×4): 80 g via INTRAVENOUS
  Filled 2023-07-17 (×4): qty 800

## 2023-07-17 MED ORDER — MAGNESIUM SULFATE 2 GM/50ML IV SOLN
2.0000 g | Freq: Once | INTRAVENOUS | Status: AC
  Administered 2023-07-17: 2 g via INTRAVENOUS
  Filled 2023-07-17: qty 50

## 2023-07-17 MED ORDER — ENSURE MAX PROTEIN PO LIQD
11.0000 [oz_av] | Freq: Two times a day (BID) | ORAL | Status: DC
  Administered 2023-07-18 – 2023-07-20 (×4): 11 [oz_av] via ORAL
  Filled 2023-07-17 (×7): qty 330

## 2023-07-17 NOTE — Progress Notes (Signed)
 Occupational Therapy Treatment Patient Details Name: Tiffany Velasquez MRN: [id number] DOB: 10/09/1995 Today's Date: 07/17/2023   History of present illness Pt is 28 y.o. female presenting 07/14/23 with 2 months of progressive weakness. Pt has fallen 6 times in the past 2 weeks. She was recently admitted 4/11 for generalized weakness as well; found to have significant nutritional deficiencies (including B12) in the setting of an aggressive weight loss program;  PMH asthma, glaucoma, panuveitis, HTN, anemia and peripheral neuropathy, obesity.   OT comments  Pt continues to exhibit Fear of falling, needing moderate reassurance throughout session to calm nerves. Worked with pt on strengthening of cervical musculature for better independence with head control. Pt remains generally weak, tolerate some BLE strengthening well and was able to stand with Max A+2 today to move towards HOB. Further progress limited by transport to CT, OT to continue working with pt to progress her as able. DC plans remain appropriate for CIR.       If plan is discharge home, recommend the following:  A lot of help with walking and/or transfers;Two people to help with walking and/or transfers;A lot of help with bathing/dressing/bathroom;Assistance with Charity fundraiser (measurements OT);Wheelchair cushion (measurements OT);Hospital bed    Recommendations for Other Services      Precautions / Restrictions Precautions Precautions: Fall Recall of Precautions/Restrictions: Intact Precaution/Restrictions Comments: 6 falls recently, FOF Restrictions Weight Bearing Restrictions Per Provider Order: No       Mobility Bed Mobility Overal bed mobility: Needs Assistance Bed Mobility: Rolling, Sidelying to Sit, Supine to Sit Rolling: Supervision Sidelying to sit: Mod assist   Sit to supine: Mod assist   General bed mobility comments: Mod A to raise trunk, pt able to roll into  sidelying without physical assist.    Transfers Overall transfer level: Needs assistance Equipment used: 2 person hand held assist Transfers: Sit to/from Stand Sit to Stand: +2 physical assistance, Max assist, +2 safety/equipment           General transfer comment: Max A+2 to stand, pt able to clear bottom but posture not fully upright. Took 3 lateral steps towards HOB, continues to exhibit FOF.     Balance Overall balance assessment: Needs assistance Sitting-balance support: No upper extremity supported, Feet supported Sitting balance-Leahy Scale: Fair     Standing balance support: Bilateral upper extremity supported, Reliant on assistive device for balance Standing balance-Leahy Scale: Zero Standing balance comment: reliant on ext support                           ADL either performed or assessed with clinical judgement   ADL                                         General ADL Comments: worked with pt on cervical control, sitting balance, and exercises EOB    Extremity/Trunk Assessment Upper Extremity Assessment Upper Extremity Assessment: Generalized weakness            Vision       Perception     Praxis     Communication Communication Communication: No apparent difficulties   Cognition Arousal: Alert Behavior During Therapy: Anxious Cognition: No apparent impairments             OT - Cognition Comments: Fear of Falling (FOF)  Following commands: Intact        Cueing   Cueing Techniques: Verbal cues  Exercises General Exercises - Lower Extremity Short Arc Quad: AROM, Seated, Strengthening, Both, 15 reps, Other (comment) (itermittent resistance) Other Exercises Other Exercises: AAROM cerivcal flexion/ext, lateral flexion with pt tracking hand movement with use of head turns. OT providng support underneath pt's chin Other Exercises: Dynamic reaching sitting EOB incorporating the use of lateral  leans x3 L and x3 R    Shoulder Instructions       General Comments HR 128 bpm with activity    Pertinent Vitals/ Pain       Pain Assessment Pain Assessment: Faces Faces Pain Scale: Hurts little more Pain Location: L hip Pain Descriptors / Indicators: Discomfort, Sharp Pain Intervention(s): Limited activity within patient's tolerance, Monitored during session  Home Living   Living Arrangements: Parent;Other relatives (brother and uncle) Available Help at Discharge: Family;Available 24 hours/day Type of Home: House Home Access: Stairs to enter [employer] of Steps: 3 Entrance Stairs-Rails: Left Home Layout: One level     Bathroom Shower/Tub: Chief Strategy Officer: Standard Bathroom Accessibility: Yes How Accessible: Accessible via walker        Lives With: Family    Prior Functioning/Environment              Frequency  Min 2X/week        Progress Toward Goals  OT Goals(current goals can now be found in the care plan section)  Progress towards OT goals: Progressing toward goals  Acute Rehab OT Goals Patient Stated Goal: To get better OT Goal Formulation: With patient Time For Goal Achievement: 07/29/23 Potential to Achieve Goals: Good  Plan      Co-evaluation                 AM-PAC OT "6 Clicks" Daily Activity     Outcome Measure   Help from another person eating meals?: A Little Help from another person taking care of personal grooming?: A Little Help from another person toileting, which includes using toliet, bedpan, or urinal?: A Lot Help from another person bathing (including washing, rinsing, drying)?: A Lot Help from another person to put on and taking off regular upper body clothing?: A Lot Help from another person to put on and taking off regular lower body clothing?: Total 6 Click Score: 13    End of Session    OT Visit Diagnosis: Unsteadiness on feet (R26.81);Other abnormalities of gait and mobility  (R26.89);Muscle weakness (generalized) (M62.81);Pain;Low vision, both eyes (H54.2) Pain - Right/Left:  (bilat) Pain - part of body:  (bottom of feet)   Activity Tolerance Patient tolerated treatment well   Patient Left in bed;with call bell/phone within reach;Other (comment) (transport team at doorway)   Nurse Communication Need for lift equipment (+2-3 stedy if attempting to get OOB,)        Time: 4098-1191 OT Time Calculation (min): 21 min  Charges: OT General Charges $OT Visit: 1 Visit OT Treatments $Therapeutic Activity: 8-22 mins  07/17/2023  AB, OTR/L  Acute Rehabilitation Services  Office: [phone]   Jorene New 07/17/2023, 2:40 PM

## 2023-07-17 NOTE — Assessment & Plan Note (Deleted)
 AST and ALT elevated on admission. AST/ALT ratio of ~3. GGT also elevated. Denies any alcohol use. - Will obtain RUQ ultrasound

## 2023-07-17 NOTE — Assessment & Plan Note (Deleted)
 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  Per neurology recs yesterday, antibody testing is challenging in setting of IVIG infusions.  Will hold off on geological antibody testing at this time.  GGT elevated to 68, though alkaline phosphatase remains normal. Will add on HFP to lab work from this AM to trend AST/ALT. CK increased today, will continue to trend. Awaiting MR cervical spine and MR thoracic today. Awaiting Echo. Vit D low, awaiting Vitamins A, E, K. Humara may also be contributing.  - Neurology following, appreciate recs - Continue B12 injections, IV folate, IV thiamine  and MV - Continue Vit D supplemenation - Anti-jo pending to work up autoimmune myositis  - Visual acuity testing ordered, pending  - MRI cervical spine/thoracic pending (obtained due to urinary retention)  - CCM - PT/OT to treat - continue LR IVF @ 100 mL/hr  - AM CBC, CMP  - Continue to trend CK

## 2023-07-17 NOTE — Care Management Important Message (Signed)
 Important Message  Patient Details  Name: Tiffany Velasquez MRN: [id number] Date of Birth: 1995/04/09   Important Message Given:  Yes - Medicare IM     Wynonia Hedges 07/17/2023, 2:55 PM

## 2023-07-17 NOTE — Assessment & Plan Note (Deleted)
-   continue IVF - Gastric emptying study  - zofran  prn - AM CMP, replete electrolytes prn  - RD consult  - will consider GI consult

## 2023-07-17 NOTE — Assessment & Plan Note (Signed)
>>  ASSESSMENT AND PLAN FOR GENERALIZED WEAKNESS  MYOSITIS WRITTEN ON 07/17/2023  3:31 PM BY DIONA PERKINS, MD  Overall LE weakness likely secondary to vitamin deficiency per Neuro, though also concern for nonspecific myositis as well.  Per neurology, abnormal SPEP results difficult to interpret in setting of chronic IVIG infusions (for chorioretinal inflammation) and recommend holding off on other serological antibody testing at this time. CK remains elevated, downtrending, will continue to trend.  Vit D low, which may play a role in myositis. Anti-jo negative. Cervical/thoracic MRI unremarkable. L femur MRI with diffuse edema of muscles consistent with nonspecific myositis, may be secondary to recent fall vs other inflammatory process given elevated ESR.  - Neurology following, appreciate recs - Consider L upper leg muscle biopsy if symptoms not improving with vitamin repletion  - Continue to trend CK  - Echo today  - Continue B12 injections, IV folate, IV thiamine , MVI, Vit D supplemenation - Follow up Vit A, E, K - Visual acuity testing ordered, pending  - PT/OT to treat - recommending CIR at this time - AM CBC, CMP  - Called and spoke with patient's mother to provide clinical updates

## 2023-07-17 NOTE — Assessment & Plan Note (Addendum)
 Overall LE weakness likely secondary to vitamin deficiency per Neuro, though also concern for nonspecific myositis as well.  Per neurology, abnormal SPEP results difficult to interpret in setting of chronic IVIG infusions (for chorioretinal inflammation) and recommend holding off on other serological antibody testing at this time. CK remains elevated, downtrending, will continue to trend.  Vit D low, which may play a role in myositis. Anti-jo negative. Cervical/thoracic MRI unremarkable. L femur MRI with diffuse edema of muscles consistent with nonspecific myositis, may be secondary to recent fall vs other inflammatory process given elevated ESR.  - Neurology following, appreciate recs - Consider L upper leg muscle biopsy if symptoms not improving with vitamin repletion  - Continue to trend CK  - Echo today  - Continue B12 injections, IV folate, IV thiamine , MVI, Vit D supplemenation - Follow up Vit A, E, K - Visual acuity testing ordered, pending  - PT/OT to treat - recommending CIR at this time - AM CBC, CMP  - Called and spoke with patient's mother to provide clinical updates

## 2023-07-17 NOTE — Assessment & Plan Note (Addendum)
 Hg down from 8.7 to 8.3 this AM. Of note, other cell lines also down. May be dilutional from IVF resuscitation. Elevated ferritin, suspect inflammatory. Normal iron.  - CTM with CBC

## 2023-07-17 NOTE — Assessment & Plan Note (Addendum)
 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 Per pharmacy, patient due for IVIG infusion on 4/19. Will order IVIG infusion per patient's home regimen.  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

## 2023-07-17 NOTE — Assessment & Plan Note (Addendum)
 Persistent today. No chest pain, dyspnea. Cervical/thoracic MRI with incidental lung findings c/f pneumonia. Patient overall satting well on RA without dyspnea, remains afebrile.  - CT Chest for further evaluation  - Echo today  - CCM  - continue IVF LR 100mL/hr

## 2023-07-17 NOTE — Assessment & Plan Note (Deleted)
 Persistent on exam today.  - Echo ordered  - CCM  - continue IVF rehydration with LR 100mL/hr - consider DVT/ CT PE if continues

## 2023-07-17 NOTE — Plan of Care (Signed)

## 2023-07-17 NOTE — Plan of Care (Signed)
   Problem: Education: Goal: Knowledge of General Education information will improve Description: Including pain rating scale, medication(s)/side effects and non-pharmacologic comfort measures Outcome: Completed/Met

## 2023-07-17 NOTE — Assessment & Plan Note (Deleted)
 Bladder scan overnight with 450 mL, in and out cath returned 350 mL. Patient is now s/p 2 I/O caths. May be 2/2 to opiate use.  - Bladder scans q8h - RVP - MRI cervical/thoracic pending

## 2023-07-17 NOTE — Assessment & Plan Note (Deleted)
 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 (see MAR) Retinal edema: Continue acetazolamide  250 mg twice daily Gout: Continue allopurinol  100 mg daily, holding colchicine  as can contribute to peripheral neuropathy GERD: Continue famotidine  40 mg daily, pantoprazole  40 mg daily (substitute for Dexlansoprazole ) Asthma: Continue Dulera  2 puffs twice daily (substitute for Symbicort ), albuterol  nebulizer PRN, montelukast  10 mg daily Neuropathy: Increase home duloxetine  to 60 mg daily, getting B12 injections for 6 days, then transition to oral,  gabapentin  600 mg TID HTN: continue home olmesartan -amlodipine -hydrochlorothiazide  20-5-12.5 mg daily

## 2023-07-17 NOTE — PMR Pre-admission (Signed)
 PMR Admission Coordinator Pre-Admission Assessment  Patient: Tiffany Velasquez is an 28 y.o., female MRN: [id number] DOB: 08-08-1995 Height: 5\' 9"  (175.3 cm) Weight: (!) 149 kg             Insurance Information HMO: yes    PPO:      PCP:      IPA:      80/20:      OTHER:  [employer] HMO      Policy#: [id number]      Subscriber: patient CM Name: Tiffany Velasquez     Phone#: [phone] ext [phone]   Fax#: [phone] Pre-Cert#: [id number]  approved 4/24 until 5/1 and f/u with [facility] phone [phone] ext [phone] fax [phone]      Employer:  Benefits:  Phone #: [phone]     Name: 4/23 Eff. Date: 03/29/23     Deduct: $257      Out of Pocket Max: $9350      Life Max: none  CIR: $2185 co pay per admission      SNF: no copay per day dasy 1 until 20; $214 co pay per day days 21 until 100 Outpatient: 80%     Co-Pay: 20% Home Health: 100%      Co-Pay: none DME: 80%     Co-Pay: 20% Providers: in-network  SECONDARY:   Medicaid [place]    Policy#: [id number]      Phone#:  Passport one source online active 07/19/23 MADQY  Financial Counselor:       Phone#:   The "Data Collection Information Summary" for patients in Inpatient Rehabilitation Facilities with attached "Privacy Act Statement-Health Care Records" was provided and verbally reviewed with: Patient  Emergency Contact Information Contact Information     Name Relation Home Work Mobile   Velasquez,Tiffany Mother [phone]        Other Contacts   None on File    Current Medical History  Patient Admitting Diagnosis: debility  History of Present Illness:  Tiffany Velasquez is a 28 y.o. female with a history of multiple vitamin deficiencies, abnormal immunoglobulins, focal chorioretinal inflammation, HTN, neuropathy who was admitted on 07/15/23 with progressive weakness, sensory loss, and pupillary defect. Pt was started on IVIG infusions for focal chorioretinal inflammation as she receives IVIG monthly on an OP basis.    She was  found to have thiamine  and B12 deficiencies which are being treated aggressively, in addition to elevation of multiple non-specific inflammatory markers including CK. Thighs have been tender.. Abnormal SPEP difficult to interpret with IVIG infusions ongoing. The plan at present is to hold off on other serological antibody testing at this time. Anti-Jo1 antibody is pending however. MRI of head, cervical, thoracic, and lumbar spine were negative while scan of left thigh was notable for diffuse myositis.    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 May be related secondary to recent fall vs other inflammatory process given elevate ESR. To continue B12 injections. IV folate, IV thiamine , MVI, vit D supplementation. Begin vit A repletion. Will follow up as OP for Rheumatology. Lovenox  for DVT prophylaxis.   Patient has lost 170 lbs over the past year since beginning her weight loss journey per nutritionist. Visits with OP RD monthly since November for supervised Weight Loss Visit. Pending Bariatric surgery this summer.  With PT pt has been mod assist for sit-std transfers using STEDY. She has not stood for any period of time nor has she ambulated. Pt lives in a one level home with 4 steps  to enter with her family. Pt was using a RW/furniture walking prior to this admit.    Glasgow Coma Scale Score: 15  Patient's medical record from [facility] has been reviewed by the rehabilitation admission coordinator and physician.  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 No  Family History  family history includes Allergic rhinitis in her father; Asthma in her mother; Sudden death in her cousin.  Current Medications   Current  Facility-Administered Medications:    acetaZOLAMIDE  (DIAMOX ) tablet 500 mg, 500 mg, Oral, BID, Baloch, Mahnoor, MD, 500 mg at 07/20/23 0950   albuterol  (PROVENTIL ) (2.5 MG/3ML) 0.083% nebulizer solution 2.5 mg, 2.5 mg, Nebulization, Q6H PRN, Spence, Sarah, DO   allopurinol  (ZYLOPRIM ) tablet 100 mg, 100 mg, Oral, Daily, Omar Bibber, DO, 100 mg at 07/20/23 7425   amoxicillin -clavulanate (AUGMENTIN ) 875-125 MG per tablet 1 tablet, 1 tablet, Oral, Q12H, Carey Chapman, MD, 1 tablet at 07/20/23 9563   azelastine  (ASTELIN ) 0.1 % nasal spray 2 spray, 2 spray, Each Nare, BID, Omar Bibber, DO, 2 spray at 07/20/23 1004   brimonidine  (ALPHAGAN ) 0.2 % ophthalmic solution 1 drop, 1 drop, Both Eyes, TID, Spence, Sarah, DO, 1 drop at 07/20/23 1003   Chlorhexidine  Gluconate Cloth 2 % PADS 6 each, 6 each, Topical, Daily, McDiarmid, Demetra Filter, MD, 6 each at 07/20/23 1012   cyanocobalamin  (VITAMIN B12) injection 1,000 mcg, 1,000 mcg, Intramuscular, Daily, 1,000 mcg at 07/20/23 0956 **FOLLOWED BY** [START ON 07/22/2023] cyanocobalamin  (VITAMIN B12) tablet 1,000 mcg, 1,000 mcg, Oral, Daily, Bhagat, Srishti L, MD   dorzolamide -timolol  (COSOPT ) 2-0.5 % ophthalmic solution 1 drop, 1 drop, Both Eyes, BID, Spence, Sarah, DO, 1 drop at 07/20/23 1003   DULoxetine  (CYMBALTA ) DR capsule 30 mg, 30 mg, Oral, Daily, Carey Chapman, MD, 30 mg at 07/20/23 8756   enoxaparin  (LOVENOX ) injection 70 mg, 70 mg, Subcutaneous, Q24H, Spence, Sarah, DO, 70 mg at 07/20/23 4332   famotidine  (PEPCID ) tablet 40 mg, 40 mg, Oral, QHS, Spence, Sarah, DO, 40 mg at 07/19/23 2254   folic acid  injection 1 mg, 1 mg, Intravenous, Daily, McDiarmid, Demetra Filter, MD, 1 mg at 07/20/23 1149   gabapentin  (NEURONTIN ) capsule 600 mg, 600 mg, Oral, TID, Spence, Sarah, DO, 600 mg at 07/20/23 9518   irbesartan  (AVAPRO ) tablet 150 mg, 150 mg, Oral, Daily, 150 mg at 07/20/23 0950 **AND** hydrochlorothiazide  (HYDRODIURIL ) tablet 12.5 mg, 12.5 mg, Oral, Daily, McDiarmid, Demetra Filter, MD,  12.5 mg at 07/20/23 0950   ibuprofen  (ADVIL ) tablet 600 mg, 600 mg, Oral, Q6H PRN, Carey Chapman, MD, 600 mg at 07/20/23 1014   Immune Globulin  10% (PRIVIGEN ) IV infusion 80 g, 80 g, Intravenous, Q24H, McDiarmid, Demetra Filter, MD, Last Rate: 0 mL/hr at 07/18/23 2127, 80 g at 07/19/23 1643   latanoprost  (XALATAN ) 0.005 % ophthalmic solution 1 drop, 1 drop, Both Eyes, QHS, Spence, Sarah, DO, 1 drop at 07/20/23 0000   lidocaine  (LMX) 4 % cream, , Topical, TID, Bhagat, Srishti L, MD, 1 Application at 07/20/23 1004   mometasone -formoterol  (DULERA ) 200-5 MCG/ACT inhaler 2 puff, 2 puff, Inhalation, BID, Spence, Sarah, DO, 2 puff at 07/20/23 0806   montelukast  (SINGULAIR ) tablet 10 mg, 10 mg, Oral, QHS, Omar Bibber,  DO, 10 mg at 07/19/23 2254   multivitamin with minerals tablet 1 tablet, 1 tablet, Oral, Daily, Bhagat, Srishti L, MD, 1 tablet at 07/20/23 1001   ondansetron  (ZOFRAN -ODT) disintegrating tablet 4 mg, 4 mg, Oral, Q8H PRN, Omar Bibber, DO, 4 mg at 07/15/23 4098   Oral care mouth rinse, 15 mL, Mouth Rinse, PRN, McDiarmid, Demetra Filter, MD   pantoprazole  (PROTONIX ) EC tablet 40 mg, 40 mg, Oral, Q0600, McDiarmid, Demetra Filter, MD, 40 mg at 07/20/23 0950   polyethylene glycol (MIRALAX  / GLYCOLAX ) packet 17 g, 17 g, Oral, Daily, Omar Bibber, DO, 17 g at 07/20/23 1191   protein supplement (ENSURE MAX) liquid, 11 oz, Oral, BID, McDiarmid, Demetra Filter, MD, 11 oz at 07/20/23 1012   sodium chloride  flush (NS) 0.9 % injection 10-40 mL, 10-40 mL, Intracatheter, PRN, McDiarmid, Demetra Filter, MD   [COMPLETED] thiamine  (VITAMIN B1) 500 mg in sodium chloride  0.9 % 50 mL IVPB, 500 mg, Intravenous, Q8H, Last Rate: 110 mL/hr at 07/17/23 2148, 500 mg at 07/17/23 2148 **FOLLOWED BY** thiamine  (VITAMIN B1) 250 mg in sodium chloride  0.9 % 50 mL IVPB, 250 mg, Intravenous, Daily, Last Rate: 105 mL/hr at 07/20/23 0952, 250 mg at 07/20/23 0952 **FOLLOWED BY** [START ON 07/24/2023] thiamine  (VITAMIN B1) injection 100 mg, 100 mg, Intravenous, Daily,  Bhagat, Srishti L, MD   vitamin A  capsule 10,000 Units, 10,000 Units, Oral, Daily, Carey Chapman, MD, 10,000 Units at 07/20/23 0950   Vitamin D  (Ergocalciferol ) (DRISDOL ) 1.25 MG (50000 UNIT) capsule 50,000 Units, 50,000 Units, Oral, Q7 days, Dema Filler, MD, 50,000 Units at 07/15/23 1822  Patients Current Diet:  Diet Order             Diet regular Room service appropriate? Yes; Fluid consistency: Thin  Diet effective now                  Precautions / Restrictions Precautions Precautions: Fall Precaution/Restrictions Comments: 6 falls recently Restrictions Weight Bearing Restrictions Per Provider Order: No   Has the patient had 2 or more falls or a fall with injury in the past year?Yes  Prior Activity Level Limited Community (1-2x/wk): gets of out of house ~2 days/week  Prior Functional Level Prior Function Prior Level of Function : Needs assist Physical Assist : ADLs (physical), Mobility (physical) Mobility (physical): Gait, Stairs ADLs (physical): IADLs, Bathing, Dressing Mobility Comments: uses RW for longer distances in the house, otherwise furniture/wall walks, brothers help pt rise into standing until steady then hand her RW ADLs Comments: family helps her in and out of tub to shower seat and helps her get washed up, she dresses herself, A for toileting.  Self Care: Did the patient need help bathing, dressing, using the toilet or eating?  Needed some help  Indoor Mobility: Did the patient need assistance with walking from room to room (with or without device)? Independent  Stairs: Did the patient need assistance with internal or external stairs (with or without device)? Needed some help  Functional Cognition: Did the patient need help planning regular tasks such as shopping or remembering to take medications? Needed some help  Patient Information Are you of Hispanic, Latino/a,or Spanish origin?: A. No, not of Hispanic, Latino/a, or Spanish origin What is your  race?: B. Black or African American Do you need or want an interpreter to communicate with a doctor or health care staff?: 0. No  Patient's Response To:  Health Literacy and Transportation Is the patient able to respond to health literacy and transportation needs?:  Yes Health Literacy - How often do you need to have someone help you when you read instructions, pamphlets, or other written material from your doctor or pharmacy?: Never In the past 12 months, has lack of transportation kept you from medical appointments or from getting medications?: No In the past 12 months, has lack of transportation kept you from meetings, work, or from getting things needed for daily living?: No  Home Assistive Devices / Equipment Home Equipment: Agricultural consultant (2 wheels), BSC/3in1  Prior Device Use: Indicate devices/aids used by the patient prior to current illness, exacerbation or injury? Walker and cane  Current Functional Level Cognition  Orientation Level: Oriented X4    Extremity Assessment (includes Sensation/Coordination)  Upper Extremity Assessment: Generalized weakness  Lower Extremity Assessment: Generalized weakness RLE Deficits / Details: Able to actively flex/extend hip and knee RLE Sensation: history of peripheral neuropathy LLE Deficits / Details: actively felx/extend hips and knees LLE Sensation: history of peripheral neuropathy    ADLs  Overall ADL's : Needs assistance/impaired Eating/Feeding: Independent, Bed level Grooming: Bed level, Set up Upper Body Bathing: Bed level, Minimal assistance Lower Body Bathing: Maximal assistance, Bed level, Sitting/lateral leans Upper Body Dressing : Bed level, Moderate assistance Lower Body Dressing: Total assistance, Sitting/lateral leans Toilet Transfer Details (indicate cue type and reason): unable to stand on this date, anticipate Max A +2 Toileting- Clothing Manipulation and Hygiene: Maximal assistance, +2 for safety/equipment, +2 for  physical assistance General ADL Comments: worked with pt on cervical control and progressing OOB mobility    Mobility  Overal bed mobility: Needs Assistance Bed Mobility: Rolling Rolling: Supervision Sidelying to sit: Mod assist Supine to sit: Mod assist Sit to supine: Mod assist, +2 for physical assistance General bed mobility comments: Pt transitions from supine to moving BLE off EOB with min assist, then in sidelying position, requires mod assist to upright trunk. Mod A +2 to LE's for return to supine. Bed placed in [place] and had pt participate with rolling and scooting up using bed rails and HOB with knees bridged to pull/ push to [facility]    Transfers  Overall transfer level: Needs assistance Equipment used: Ambulation equipment used, Rolling walker (2 wheels) Transfers: Sit to/from Stand Sit to Stand: +2 physical assistance, Max assist, +2 safety/equipment, Mod assist, Via lift equipment Bed to/from chair/wheelchair/BSC transfer type:: Via Financial planner via Lift Equipment: Stedy General transfer comment: first used RW and pt sit>stand 2x with mod A +2. Immediate return to sitting first time, maintained standing 5 secs second time with full clearance of buttocks. After this, attempted standing in stedy multiple times for pivot to recliner but pt unable to clear buttocks again, max A +2 given    Ambulation / Gait / Stairs / Wheelchair Mobility  Ambulation/Gait General Gait Details: unable    Posture / Balance Dynamic Sitting Balance Sitting balance - Comments: pt holding head up for longer periods of time in sitting today. Pt able to lean down with elbows on knees and return to sitting. Balance Overall balance assessment: Needs assistance Sitting-balance support: No upper extremity supported, Feet supported Sitting balance-Leahy Scale: Fair Sitting balance - Comments: pt holding head up for longer periods of time in sitting today. Pt able to lean down with elbows on  knees and return to sitting. Standing balance support: Bilateral upper extremity supported, Reliant on assistive device for balance Standing balance-Leahy Scale: Zero Standing balance comment: BUE support on stedy/ RW    Special needs/care consideration External Urinary Catheter Monthly IVIG  daily x 4 doses as an OP Bariatric     Previous Home Environment  Living Arrangements: Parent, Other relatives (brother and uncle)  Lives With: Family Available Help at Discharge: Family, Available 24 hours/day Type of Home: House Home Layout: One level Home Access: Stairs to enter Entrance Stairs-Rails: Left Entrance Stairs-Number of Steps: 3 Bathroom Shower/Tub: Engineer, manufacturing systems: Standard Bathroom Accessibility: Yes How Accessible: Accessible via walker Home Care Services: No  Discharge Living Setting Plans for Discharge Living Setting: Patient's home Type of Home at Discharge: House Discharge Home Layout: One level Discharge Home Access: Stairs to enter Entrance Stairs-Rails: Left Entrance Stairs-Number of Steps: 3 Discharge Bathroom Shower/Tub: Tub/shower unit Discharge Bathroom Toilet: Standard Discharge Bathroom Accessibility: Yes How Accessible: Accessible via walker Does the patient have any problems obtaining your medications?: No  Social/Family/Support Systems Anticipated Caregiver: Tiffany Velasquez, mother Anticipated Caregiver's Contact Information: [phone] Caregiver Availability: 24/7 Discharge Plan Discussed with Primary Caregiver: Yes Is Caregiver In Agreement with Plan?: Yes Does Caregiver/Family have Issues with Lodging/Transportation while Pt is in Rehab?: No  Goals Patient/Family Goal for Rehab: Supervision: PT/OT Expected length of stay: 13-20 days Pt/Family Agrees to Admission and willing to participate: Yes Program Orientation Provided & Reviewed with Pt/Caregiver Including Roles  & Responsibilities: Yes  Decrease burden of Care through IP  rehab admission: NA  Possible need for SNF placement upon discharge:Not anticipated  Patient Condition: This patient's condition remains as documented in the consult dated 07/17/23, in which the Rehabilitation Physician determined and documented that the patient's condition is appropriate for intensive rehabilitative care in an inpatient rehabilitation facility. Will admit to inpatient rehab today.  Preadmission Screen Completed By:  Jeannetta Millman RN MSN, 07/20/2023 3:31 PM ______________________________________________________________________   Discussed status with Dr. Raynaldo Call on 07/20/23 at 1532 and received approval for admission today.  Admission Coordinator:  Jeannetta Millman RN MSN, time 0981 Date 07/20/23

## 2023-07-17 NOTE — Assessment & Plan Note (Addendum)
 AST and ALT elevated on admission. AST/ALT ratio of ~3. GGT also elevated. Denies any alcohol use. RUQ US  with gallbladder sludge, otherwise unremarkable.  - CTM with CMP

## 2023-07-17 NOTE — Consult Note (Signed)
 Physical Medicine and Rehabilitation Consult Reason for Consult:impaired functional mobility Referring Physician: McDiarmid   HPI: Tiffany Velasquez is a 28 y.o. female with a history of multiple vitamin deficiencies, abnormal immunoglobulins, focal chorioretinal inflammation, htn, neuropathy who was admitted on 07/15/23 with progressive weakness, sensory loss, and pupillary defect. Pt was started on IVIG infusions for focal chroioretinal inflammation. She was found to have thiamine  and B12 deficiencies which are being treated aggressively, in addition to elevation of multiple non-specific inflammatory markers including CK. Thighs have been tender.. Abnormal SPEP difficult to interpret with IVIG infusions ongoing. The plan at present is to hold off on other serological antibody testing at this time. Anti-Jo1 antibody is pending however. MRI of head, cervical, thoracic, and lumbar spine were negative while scan of left thigh was notable for diffuse myositis.  With PT pt has been mod assist for sit-std transfers using Stedy. She has not stood for any period of time nor has she ambulated. Pt lives in a one level home with 4 steps to enter with her family. Pt was using a RW/furniture walking prior to this admit.    Review of Systems  Constitutional:  Positive for malaise/fatigue.  HENT: Negative.    Eyes:  Positive for blurred vision.  Respiratory: Negative.    Cardiovascular: Negative.   Gastrointestinal:  Positive for nausea and vomiting.  Genitourinary:        Urine retention  Musculoskeletal:  Positive for back pain, falls and myalgias.  Skin: Negative.   Neurological:  Positive for dizziness, sensory change, focal weakness and weakness.  Psychiatric/Behavioral:  The patient does not have insomnia.    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   Past Surgical History:  Procedure Laterality Date   BIOPSY  04/04/2023   Procedure: BIOPSY;  Surgeon: Junie Olds, MD;  Location: [facility];  Service: General;;   CATARACT EXTRACTION     ESOPHAGOGASTRODUODENOSCOPY N/A 04/04/2023   Procedure: ESOPHAGOGASTRODUODENOSCOPY (EGD);  Surgeon: Junie Olds, MD;  Location: [facility];  Service: General;  Laterality: N/A;   PORTA CATH INSERTION     Family History  Problem Relation Age of Onset   Asthma Mother    Allergic rhinitis Father    Sudden death Cousin    Eczema Neg Hx    Urticaria Neg Hx    Social History:  reports that she has never smoked. She has never used smokeless tobacco. She reports current alcohol use. She reports that she does not use drugs. Allergies:  Allergies  Allergen Reactions   Other Itching, Rash and Swelling    Seafood,tomato paste, peanut  butter, peaches, oranges, apples Throat swelling  Dust mite, oak trees, grass- causes rash, itching   Peanut -Containing Drug Products Anaphylaxis   Shellfish Allergy  Anaphylaxis    Throat swelling    Apple Juice Rash   Orange Fruit [Citrus] Rash   Peach Flavoring Agent (Non-Screening) Rash   Tomato Rash, Hives and Itching   Tree Extract Rash   Medications Prior to Admission  Medication Sig Dispense Refill   acetaminophen  (TYLENOL ) 500 MG tablet Take 2 tablets (1,000 mg total) by mouth every 6 (six) hours as needed for mild pain (pain score 1-3) or moderate pain (pain score 4-6). (Patient taking differently: Take 500-1,000 mg by mouth  every 6 (six) hours as needed for mild pain (pain score 1-3) or moderate pain (pain score 4-6).)     acetaZOLAMIDE  (DIAMOX ) 250 MG tablet Take 500 mg by mouth 2 (two) times daily.     albuterol  (PROVENTIL ) (2.5 MG/3ML) 0.083% nebulizer solution Take 3 mLs (2.5 mg total) by nebulization every 6 (six) hours as needed for wheezing or shortness of breath. (Patient taking differently: Take 2.5 mg  by nebulization every 4 (four) hours as needed for wheezing or shortness of breath.) 75 mL 1   albuterol  (VENTOLIN  HFA) 108 (90 Base) MCG/ACT inhaler Inhale 2 puffs every 4-6 hours as needed for cough, wheeze, tightness in chest, or shortness of breath 8 g 1   allopurinol  (ZYLOPRIM ) 100 MG tablet Take 1 tablet (100 mg total) by mouth daily. 30 tablet 2   azelastine  (ASTELIN ) 0.1 % nasal spray USE 2 SPRAYS IN EACH NOSTRIL TWICE DAILY AS NEEDED FOR RUNNY NOSE/DRAINAGE DOWN THROAT (Patient taking differently: 2 sprays 2 (two) times daily. USE 2 SPRAYS IN EACH NOSTRIL TWICE DAILY AS NEEDED FOR RUNNY NOSE/DRAINAGE DOWN THROAT) 30 mL 5   bimatoprost (LUMIGAN) 0.01 % SOLN Place 1 drop into both eyes at bedtime.     brimonidine  (ALPHAGAN ) 0.2 % ophthalmic solution Place 1 drop into both eyes 3 (three) times daily.     budesonide -formoterol  (SYMBICORT ) 160-4.5 MCG/ACT inhaler Inhale 2 puffs into the lungs in the morning and at bedtime. (Patient taking differently: Inhale 2 puffs into the lungs at bedtime.) 1 each 5   colchicine  0.6 MG tablet Take 1 tablet (0.6 mg total) by mouth daily. 30 tablet 2   dexlansoprazole  (DEXILANT ) 60 MG capsule Take 1 capsule (60 mg total) by mouth daily. (Patient taking differently: Take 60 mg by mouth at bedtime.) 90 capsule 3   dorzolamide -timolol  (COSOPT ) 2-0.5 % ophthalmic solution Place 1 drop into both eyes 2 (two) times daily.     DULoxetine  (CYMBALTA ) 30 MG capsule Take 1 capsule (30 mg total) by mouth daily. (Patient taking differently: Take 30 mg by mouth at bedtime.) 30 capsule 3   famotidine  (PEPCID ) 40 MG tablet Take 1 tablet (40 mg total) by mouth at bedtime. 90 tablet 3   fluticasone  (FLONASE ) 50 MCG/ACT nasal spray Place 1 spray into both nostrils daily. 16 each 3   gabapentin  (NEURONTIN ) 300 MG capsule Start at 300 p.o. 3 times daily.  If tolerated work up to 600 mg p.o. 3 times daily (Patient taking differently: Take 600 mg by mouth 3 (three) times daily.) 90  capsule 3   GAMUNEX-C 20 GM/200ML SOLN      Heparin  Na, Pork, Lock Flsh PF (BD HEPARIN  POSIFLUSH) 100 UNIT/ML SOLN Inject 100 Units into the vein daily as needed (Given by nurse at home).     loratadine  (CLARITIN ) 10 MG tablet Take 1 tablet (10 mg total) by mouth daily. 30 tablet 11   metoCLOPramide  (REGLAN ) 10 MG tablet Take 1 tablet (10 mg total) by mouth every 8 (eight) hours as needed for nausea. 30 tablet 0   montelukast  (SINGULAIR ) 10 MG tablet Take 1 tablet (10 mg total) by mouth at bedtime. 90 tablet 1   mycophenolate (CELLCEPT) 500 MG tablet Take 500 mg by mouth 2 (two) times daily.     Olmesartan -amLODIPine -HCTZ 20-5-12.5 MG TABS Take 1 tablet by mouth daily. 30 tablet 3   ondansetron  (ZOFRAN ) 8 MG tablet Take 1 tablet (8 mg total) by mouth every 8 (eight) hours as needed for nausea or vomiting. 90  tablet 1   polyethylene glycol powder (GLYCOLAX /MIRALAX ) 17 GM/SCOOP powder Take 17 g by mouth daily. (Patient taking differently: Take 17 g by mouth in the morning and at bedtime.) 500 g 10   RETIN-A  MICRO 0.04 % gel Apply 1 application  topically daily as needed.     RHOPRESSA  0.02 % SOLN Place 1 drop into the right eye at bedtime.     sucralfate  (CARAFATE ) 1 GM/10ML suspension Take 10 mLs (1 g total) by mouth 4 (four) times daily -  with meals and at bedtime. (Patient taking differently: Take 1 g by mouth 2 (two) times daily.) 420 mL 3   vitamin B-12 (VITAMIN B12) 500 MCG tablet Take 1 tablet (500 mcg total) by mouth daily. 28 tablet 0   Adalimumab 40 MG/0.4ML PNKT Inject 40 mg into the skin every 14 (fourteen) days.      Home: Home Living Family/patient expects to be discharged to:: Private residence Living Arrangements: Parent, Other relatives (brother and uncle) Available Help at Discharge: Family, Available 24 hours/day Type of Home: House Home Access: Stairs to enter Secretary/administrator of Steps: 3 Entrance Stairs-Rails: Left Home Layout: One level Bathroom Shower/Tub:  Engineer, manufacturing systems: Standard Bathroom Accessibility: Yes Home Equipment: Agricultural consultant (2 wheels), BSC/3in1  Lives With: Family  Functional History: Prior Function Prior Level of Function : Needs assist Physical Assist : ADLs (physical), Mobility (physical) Mobility (physical): Gait, Stairs ADLs (physical): IADLs, Bathing, Dressing Mobility Comments: uses RW for longer distances in the house, otherwise furniture/wall walks, brothers help pt rise into standing until steady then hand her RW ADLs Comments: family helps her in and out of tub to shower seat and helps her get washed up, she dresses herself, A for toileting. Functional Status:  Mobility: Bed Mobility Overal bed mobility: Needs Assistance Bed Mobility: Rolling, Sidelying to Sit, Sit to Supine Rolling: Supervision Sidelying to sit: Mod assist Supine to sit: Mod assist Sit to supine: Mod assist General bed mobility comments: Mod A to raise trunk, pt able to roll into sidelying without physical assist. Transfers Overall transfer level: Needs assistance Equipment used: Ambulation equipment used Transfers: Sit to/from Stand, Bed to chair/wheelchair/BSC Sit to Stand: +2 physical assistance, Max assist, Total assist Bed to/from chair/wheelchair/BSC transfer type:: Via Lift equipment Transfer via Lift Equipment: Veronica Gordon General transfer comment: 3 attempts to stand from [facility] to stedy with 2 person assist; unable to rise; Used Maximove lift for safe transfer from [facility] back to bed      ADL: ADL Overall ADL's : Needs assistance/impaired Eating/Feeding: Independent, Bed level Grooming: Bed level, Set up Upper Body Bathing: Bed level, Minimal assistance Lower Body Bathing: Maximal assistance, Bed level, Sitting/lateral leans Upper Body Dressing : Bed level, Moderate assistance Lower Body Dressing: Total assistance, Sitting/lateral leans Toilet Transfer Details (indicate cue type and reason): unable to stand  on this date, anticipate Max A +2 Toileting- Clothing Manipulation and Hygiene: Maximal assistance, +2 for safety/equipment, +2 for physical assistance  Cognition: Cognition Orientation Level: Oriented X4 Cognition Arousal: Alert Behavior During Therapy: WFL for tasks assessed/performed  Blood pressure 127/85, pulse (!) 109, temperature 98.4 F (36.9 C), temperature source Oral, resp. rate 19, height 5\' 9"  (1.753 m), weight (!) 148.4 kg, SpO2 98%. Physical Exam Constitutional:      General: She is not in acute distress.    Appearance: She is obese.  HENT:     Head: Normocephalic and atraumatic.     Right Ear: External ear normal.  Left Ear: External ear normal.     Mouth/Throat:     Mouth: Mucous membranes are moist.  Eyes:     Extraocular Movements: Extraocular movements intact.     Conjunctiva/sclera: Conjunctivae normal.  Cardiovascular:     Rate and Rhythm: Tachycardia present.  Pulmonary:     Effort: Pulmonary effort is normal.  Abdominal:     Palpations: Abdomen is soft.  Musculoskeletal:        General: Tenderness (both thighs with palpation, ROM) present. Normal range of motion.     Cervical back: Normal range of motion.  Skin:    General: Skin is warm.  Neurological:     Comments: Pt is alert and oriented. Normal language and speech. Functional memory, insight and awareness. Poor visual acuity. Able to see colors and shapes from about 10' away but not specific letters or numbers. MMT: BUE 4/5 deltoids but 4+ to 5/5 biceps, triceps, wrist and hands. BLE: 4/5 HF, KE and 4+/5 ADF/PF. Patchy sensory changes in both lower legs but not specifically the feet. DTR's 2+ UE and tr in LE's. No abnl resting tone.   Psychiatric:        Mood and Affect: Mood normal.        Behavior: Behavior normal.     Results for orders placed or performed during the hospital encounter of 07/14/23 (from the past 24 hours)  Iron and TIBC     Status: Abnormal   Collection Time: 07/16/23   3:47 PM  Result Value Ref Range   Iron 46 28 - 170 ug/dL   TIBC 161 (L) 096 - 045 ug/dL   Saturation Ratios 27 10.4 - 31.8 %   UIBC 126 ug/dL  Protime-INR     Status: Abnormal   Collection Time: 07/16/23  9:01 PM  Result Value Ref Range   Prothrombin Time 15.3 (H) 11.4 - 15.2 seconds   INR 1.2 0.8 - 1.2  CK     Status: Abnormal   Collection Time: 07/17/23  5:32 AM  Result Value Ref Range   Total CK 1,250 (H) 38 - 234 U/L  CBC     Status: Abnormal   Collection Time: 07/17/23  5:32 AM  Result Value Ref Range   WBC 7.9 4.0 - 10.5 K/uL   RBC 2.85 (L) 3.87 - 5.11 MIL/uL   Hemoglobin 8.3 (L) 12.0 - 15.0 g/dL   HCT 40.9 (L) 81.1 - 91.4 %   MCV 93.3 80.0 - 100.0 fL   MCH 29.1 26.0 - 34.0 pg   MCHC 31.2 30.0 - 36.0 g/dL   RDW 78.2 (H) 95.6 - 21.3 %   Platelets 203 150 - 400 K/uL   nRBC 0.3 (H) 0.0 - 0.2 %  Comprehensive metabolic panel     Status: Abnormal   Collection Time: 07/17/23  5:32 AM  Result Value Ref Range   Sodium 135 135 - 145 mmol/L   Potassium 3.5 3.5 - 5.1 mmol/L   Chloride 100 98 - 111 mmol/L   CO2 25 22 - 32 mmol/L   Glucose, Bld 89 70 - 99 mg/dL   BUN 7 6 - 20 mg/dL   Creatinine, Ser 0.86 0.44 - 1.00 mg/dL   Calcium 8.8 (L) 8.9 - 10.3 mg/dL   Total Protein 6.4 (L) 6.5 - 8.1 g/dL   Albumin 2.2 (L) 3.5 - 5.0 g/dL   AST 578 (H) 15 - 41 U/L   ALT 87 (H) 0 - 44 U/L   Alkaline Phosphatase 61  38 - 126 U/L   Total Bilirubin 0.8 0.0 - 1.2 mg/dL   GFR, Estimated >16 >10 mL/min   Anion gap 10 5 - 15  Magnesium      Status: Abnormal   Collection Time: 07/17/23  5:32 AM  Result Value Ref Range   Magnesium  1.6 (L) 1.7 - 2.4 mg/dL  Ferritin     Status: Abnormal   Collection Time: 07/17/23  5:32 AM  Result Value Ref Range   Ferritin 791 (H) 11 - 307 ng/mL  Retic Panel     Status: Abnormal   Collection Time: 07/17/23  5:32 AM  Result Value Ref Range   Retic Ct Pct 0.5 0.4 - 3.1 %   RBC. 2.82 (L) 3.87 - 5.11 MIL/uL   Retic Count, Absolute 13.5 (L) 19.0 - 186.0 K/uL    Immature Retic Fract 8.1 2.3 - 15.9 %   Reticulocyte Hemoglobin 27.7 (L) >27.9 pg  Phosphorus     Status: None   Collection Time: 07/17/23  5:32 AM  Result Value Ref Range   Phosphorus 2.7 2.5 - 4.6 mg/dL   MR THORACIC SPINE W WO CONTRAST Result Date: 07/17/2023 CLINICAL DATA:  CSF leak/Spontaneous intracranial hypotension suspected; CSF leak suspected/ Spontaneous intracranial hypotension EXAM: MRI CERVICAL AND THORACIC SPINE WITHOUT AND WITH CONTRAST TECHNIQUE: Multiplanar and multiecho pulse sequences of the cervical spine, to include the craniocervical junction and cervicothoracic junction, and the thoracic spine, were obtained without and with intravenous contrast. CONTRAST:  10mL GADAVIST  GADOBUTROL  1 MMOL/ML IV SOLN COMPARISON:  None Available. FINDINGS: MRI CERVICAL SPINE FINDINGS Alignment: Normal. Vertebrae: No fracture, evidence of discitis, or bone lesion. Cord: Normal cord signal.  No abnormal enhancement. Posterior Fossa, vertebral arteries, paraspinal tissues: Visualized vertebral artery flow voids are maintained no paraspinal edema. Partially imaged bilateral lung opacities. Disc levels: No significant canal or foraminal stenosis. No disc bulges. No visible dural outpouchings or dural defect. Normal CSF flow is noted on cines. MRI THORACIC SPINE FINDINGS Alignment: No substantial sagittal subluxation. Vertebrae: No fracture, evidence of discitis, or bone lesion. Cord: Normal cord signal. Paraspinal and other soft tissues: No paraspinal edema. Partially imaged bilateral lung opacities. Disc levels: No significant canal or foraminal stenosis. No disc bulges. No visible dural outpouchings or dural defect. IMPRESSION: 1. Partially imaged bilateral lung opacities, concerning for pneumonia. Recommend chest radiograph or chest CT to further characterize. 2. Otherwise, normal MRI of the cervical and thoracic spine. These results will be called to the ordering clinician or representative by the  Radiologist Assistant, and communication documented in the PACS or [employer]. Electronically Signed   By: Stevenson Elbe M.D.   On: 07/17/2023 00:15   MR CERVICAL SPINE W WO CONTRAST Result Date: 07/17/2023 CLINICAL DATA:  CSF leak/Spontaneous intracranial hypotension suspected; CSF leak suspected/ Spontaneous intracranial hypotension EXAM: MRI CERVICAL AND THORACIC SPINE WITHOUT AND WITH CONTRAST TECHNIQUE: Multiplanar and multiecho pulse sequences of the cervical spine, to include the craniocervical junction and cervicothoracic junction, and the thoracic spine, were obtained without and with intravenous contrast. CONTRAST:  10mL GADAVIST  GADOBUTROL  1 MMOL/ML IV SOLN COMPARISON:  None Available. FINDINGS: MRI CERVICAL SPINE FINDINGS Alignment: Normal. Vertebrae: No fracture, evidence of discitis, or bone lesion. Cord: Normal cord signal.  No abnormal enhancement. Posterior Fossa, vertebral arteries, paraspinal tissues: Visualized vertebral artery flow voids are maintained no paraspinal edema. Partially imaged bilateral lung opacities. Disc levels: No significant canal or foraminal stenosis. No disc bulges. No visible dural outpouchings or dural defect. Normal CSF flow is  noted on cines. MRI THORACIC SPINE FINDINGS Alignment: No substantial sagittal subluxation. Vertebrae: No fracture, evidence of discitis, or bone lesion. Cord: Normal cord signal. Paraspinal and other soft tissues: No paraspinal edema. Partially imaged bilateral lung opacities. Disc levels: No significant canal or foraminal stenosis. No disc bulges. No visible dural outpouchings or dural defect. IMPRESSION: 1. Partially imaged bilateral lung opacities, concerning for pneumonia. Recommend chest radiograph or chest CT to further characterize. 2. Otherwise, normal MRI of the cervical and thoracic spine. These results will be called to the ordering clinician or representative by the Radiologist Assistant, and communication documented in  the PACS or [employer]. Electronically Signed   By: Stevenson Elbe M.D.   On: 07/17/2023 00:15   MR FEMUR LEFT WO CONTRAST Result Date: 07/16/2023 CLINICAL DATA:  Myositis. EXAM: MR OF THE LEFT FEMUR WITHOUT CONTRAST TECHNIQUE: Multiplanar, multisequence MR imaging of the left femur was performed. No intravenous contrast was administered. COMPARISON:  None Available. FINDINGS: Bones/Joint/Cartilage Normal bone mineralization. No cortical erosion. The patellar apex is approximately 7 mm lateralized with respect to the trochlear notch. Ligaments No ligament tear is seen. Muscles and Tendons There is mild edema within the mid to lateral aspect of the left gluteus medius muscle and minimal edema within the proximal anterior aspect of the left gluteus minimus muscle (both innervated by the superior gluteal nerve) . There is confluent fluid measuring up to approximately 11 mm in transverse thickness deep to the iliotibial band and lateral to the gluteus medius muscle (axial series 11 images 15 through 23). There is also mild edema within the bilateral obturator externus, and left adductor longus, brevis, and magnus muscles (all innervated by the obturator nerve). Moderate edema within the long head of the biceps femoris muscle (tibial nerve) and mild edema within the semimembranosus muscle (tibial nerve). Mild edema within the partially visualized soleus and medial and lateral heads of the gastrocnemius muscles (all innervated by the tibial nerve). Very mild edema within the rectus femoris, vastus medialis, vastus intermedius, and vastus lateralis muscles (all innervated by the muscular branches of the femoral nerve). No significant edema within the tensor fascia lata (superior gluteal nerve), gluteus maximus (inferior gluteal nerve), sartorius (femoral nerve), gracilis (anterior division of the obturator nerve), short head of the biceps femoris (common peroneal nerve), or semitendinosis (tibial nerve)  muscles. Soft tissues There is mild edema within the deep subcutaneous fat along the lateral border of the vastus lateralis muscle along an approximate 20 cm length of the mid to distal thigh (axial series 11 images 32 through 56 of 56 and axial series 11 images 1 through 17 of 40). IMPRESSION: 1. There is edema within the majority of the muscles of the left buttock and thigh, consistent with the expected nonspecific myositis. This may be from an inflammatory, infectious, drug related, or posttraumatic etiologies. Muscle edema femoral are general myopathy can also be from neurogenic etiology. 2. Edema within muscles innervated by the superior gluteal nerve including gluteus medius and gluteus minimus muscles. However, no significant edema within the tensor fascia lata innervated by the superior gluteal nerve. 3. Edema within muscles innervated by the obturator nerve, including obturator externus, and the left adductor longus, brevis, and magnus. However, no significant edema within the gracilis that is innervated by the anterior division of the obturator nerve. 4. Edema within muscles innervated by the tibial nerve, including the long head of the biceps femoris muscle, semimembranosus, medial and lateral head of the gastrocnemius muscles. However, no significant edema  within the semitendinosis innervated by the tibial nerve. 5. Edema within quadriceps muscles innervated by muscular branches of the femoral nerve, including the rectus femoris, vastus medialis, vastus intermedius, and vastus lateralis muscles. However, no significant edema within the sartorius muscle intermediate about the femoral nerve. Electronically Signed   By: Bertina Broccoli M.D.   On: 07/16/2023 19:13   US  Abdomen Limited RUQ (LIVER/GB) Result Date: 07/16/2023 CLINICAL DATA:  [id number] LFT elevation [id number] EXAM: ULTRASOUND ABDOMEN LIMITED COMPARISON:  None Available. FINDINGS: The liver demonstrates normal parenchymal echogenicity and homogeneous  texture without focal hepatic parenchymal lesions or intrahepatic ductal dilatation. Layering sludge in the gallbladder. There are no stones, wall thickening or pericholecystic fluid. CBD measured 0.1cm. IMPRESSION: Gallbladder sludge. Otherwise unremarkable examination Electronically Signed   By: Sydell Eva M.D.   On: 07/16/2023 16:28    Assessment/Plan: Diagnosis: 28 yo female with what appears to be some type of inflammatory myositis in the setting of multiple vitamin deficiencies, abnormal immunoglobulins, and focal chorioretinal inflammation. The result has been impaired functional mobility due pain, sensory loss, muscle weakness, etc.  Does the need for close, 24 hr/day medical supervision in concert with the patient's rehab needs make it unreasonable for this patient to be served in a less intensive setting? Yes Co-Morbidities requiring supervision/potential complications:  -ongoing autoimmune/inflammatory work up -sinus tachycardia -nausea and vomiting -anemia -urine retention -chronic neuropathic pain/polyneuropathy -nutrition/vitamin deficiency -transaminitis Due to bladder management, bowel management, safety, skin/wound care, disease management, medication administration, pain management, and patient education, does the patient require 24 hr/day rehab nursing? Yes Does the patient require coordinated care of a physician, rehab nurse, therapy disciplines of PT, OT to address physical and functional deficits in the context of the above medical diagnosis(es)? Yes Addressing deficits in the following areas: balance, endurance, locomotion, strength, transferring, bowel/bladder control, bathing, dressing, feeding, grooming, toileting, and psychosocial support Can the patient actively participate in an intensive therapy program of at least 3 hrs of therapy per day at least 5 days per week? Yes The potential for patient to make measurable gains while on inpatient rehab is  excellent Anticipated functional outcomes upon discharge from inpatient rehab are supervision  with PT, supervision with OT, n/a with SLP. Estimated rehab length of stay to reach the above functional goals is: 13-20 days Anticipated discharge destination: Home Overall Rehab/Functional Prognosis: excellent  POST ACUTE RECOMMENDATIONS: This patient's condition is appropriate for continued rehabilitative care in the following setting: CIR Patient has agreed to participate in recommended program. Yes Note that insurance prior authorization may be required for reimbursement for recommended care.  Comment: Pt was independent with cane/rolling walker prior to admit. She is very motivated to regain functional mobility. Has supportive family. It sounds as if she took steps today with PT, but a progress report has not yet been filed. Rehab Admissions Coordinator to follow up.      I have personally performed a face to face diagnostic evaluation of this patient. Additionally, I have examined the patient's medical record including any pertinent labs and radiographic images.    Thanks,  Rawland Caddy, MD 07/17/2023

## 2023-07-17 NOTE — Progress Notes (Signed)
 Initial Nutrition Assessment  DOCUMENTATION CODES:   Obesity unspecified  INTERVENTION:  Continue Multivitamin w/ minerals daily Encourage good PO intake Ensure Max po BID, each supplement provides 150 kcal and 30 grams of protein.   NUTRITION DIAGNOSIS:   Increased nutrient needs related to acute illness as evidenced by estimated needs.  GOAL:   Patient will meet greater than or equal to 90% of their needs  MONITOR:   PO intake, Supplement acceptance, I & O's  REASON FOR ASSESSMENT:   Consult Assessment of nutrition requirement/status  ASSESSMENT:   28 y.o. female presented to the ED with N/V and worsening weakness and numbness. PMH includes GERD, HTN, panuveitis of both eyes, glaucoma, and neuropathy. Pt admitted with generalized weakness and nausea and vomiting.   Pt sleeping at time of first RD visit. At RD return visit, RN in room and pt is awake but not as conversant. Pt reports that she typically eats 2 meals per day, lunch and supper and snacks a little. Denies any nausea or vomiting. Does not drink any oral nutrition supplements at home.   No meal intakes have been recorded this admission.   Pt reports that she has lost about 170# since starting her weight loss journey. Pt visits with outpatient RD at [facility] once a month since November 2024 for Supervised Weight Loss Visit. Per EMR, pt with a 21.5% weight loss within the past 6 months.  Wt Readings from Last 20 Encounters:  07/17/23 (!) 148 kg  07/07/23 (!) 147.4 kg  07/04/23 (!) 147.4 kg  06/16/23 (!) 154.2 kg  06/05/23 (!) 154.7 kg  05/29/23 (!) 156.1 kg  05/21/23 (!) 163.3 kg  05/10/23 (!) 163.5 kg  04/20/23 (!) 165.6 kg  04/19/23 (!) 166 kg  04/12/23 (!) 166 kg  04/12/23 (!) 166 kg  04/07/23 (!) 168.7 kg  04/04/23 (!) 180.5 kg  03/08/23 (!) 181.2 kg  03/07/23 (!) 181.4 kg  02/14/23 (!) 180.8 kg  01/24/23 (!) 188.5 kg  12/19/22 (!) 196 kg  12/14/22 (!) 196.5 kg   Medications reviewed and include:  Vitamin B12, Pepcid , Folic Acid , Protonix , MVI, Miralax , Thiamine , Vitamin D  Labs reviewed: Sodium 135, Potassium 3.5, Phosphorus 2.7, Magnesium  1.6  Micronutrient Labs CRP: 27.2 (H) Folate: 4.0 (L) Copper : 125 (WNL) Thiamine : <6 (L) Vitamin B12: 449 (WNL) Vitamin D : 6.17 (L) Vitamin A : pending Vitamin K: pending Vitamin E: pending  NUTRITION - FOCUSED PHYSICAL EXAM: Deferred to follow-up due to RN providing care.   Diet Order:   Diet Order             Diet NPO time specified  Diet effective midnight           Diet regular Room service appropriate? Yes; Fluid consistency: Thin  Diet effective now                   EDUCATION NEEDS:   No education needs have been identified at this time  Skin:  Skin Assessment: Reviewed RN Assessment  Last BM:  4/20  Height:  Ht Readings from Last 1 Encounters:  07/15/23 5\' 9"  (1.753 m)   Weight:  Wt Readings from Last 1 Encounters:  07/17/23 (!) 148 kg   Ideal Body Weight:  65.9 kg  BMI:  Body mass index is 48.18 kg/m.  Estimated Nutritional Needs:  Kcal:  2000-2200 Protein:  100-120 grams Fluid:  >/= 2 L   Doneta Furbish RD, LDN Clinical Dietitian

## 2023-07-17 NOTE — Progress Notes (Signed)
 Daily Progress Note Intern Pager: [phone]  Patient name: Tiffany Velasquez Medical record number: [id number] Date of birth: 05-18-1995 Age: 28 y.o. Gender: female  Primary Care Provider: Wilhemena Harbour, MD Consultants: Neurology  Code Status: Full  Pt Overview and Major Events to Date:  4/18: Admitted  Assessment and Plan:  Wendelin Halsted 28 y.o. 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 Neurology on board. PMH includes neuropathy, multiple vitamin deficiencies, abnormal immunoglobulins, chorioretinal inflammation.  Assessment & Plan Generalized weakness  Myositis Overall LE weakness likely secondary to vitamin deficiency per Neuro, though also concern for nonspecific myositis as well.  Per neurology, abnormal SPEP results difficult to interpret in setting of chronic IVIG infusions (for chorioretinal inflammation) and recommend holding off on other serological antibody testing at this time. CK remains elevated, downtrending, will continue to trend.  Vit D low, which may play a role in myositis. Anti-jo negative. Cervical/thoracic MRI unremarkable. L femur MRI with diffuse edema of muscles consistent with nonspecific myositis, may be secondary to recent fall vs other inflammatory process given elevated ESR.  - Neurology following, appreciate recs - Consider L upper leg muscle biopsy if symptoms not improving with vitamin repletion  - Continue to trend CK  - Echo today  - Continue B12 injections, IV folate, IV thiamine , MVI, Vit D supplemenation - Follow up Vit A, E, K - Visual acuity testing ordered, pending  - PT/OT to treat - recommending CIR at this time - AM CBC, CMP  - Called and spoke with patient's mother to provide clinical updates  Nausea & vomiting Symptomatically improving.  - Cont IVF LR 100ml/hr - Gastric emptying study tomorrow - will be NPO at  [facility] - Zofran  prn - AM CMP, replete electrolytes prn  - RD consult  Tachycardia Persistent today. No chest pain, dyspnea. Cervical/thoracic MRI with incidental lung findings c/f pneumonia. Patient overall satting well on RA without dyspnea, remains afebrile.  - CT Chest for further evaluation  - Echo today  - CCM  - continue IVF LR 158mL/hr Anemia Hg down from 8.7 to 8.3 this AM. Of note, other cell lines also down. May be dilutional from IVF resuscitation. Elevated ferritin, suspect inflammatory. Normal iron.  - CTM with CBC Urinary retention Patient s/p 2 I/O caths for retention. May be 2/2 to opiate use. Cervical/thoracic MRI unremarkable.  - Bladder scans q8h - Pure wick in place Transaminitis AST and ALT elevated on admission. AST/ALT ratio of ~3. GGT also elevated. Denies any alcohol use. RUQ US  with gallbladder sludge, otherwise unremarkable.  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 Per pharmacy, patient due for IVIG infusion on 4/19. Will order IVIG infusion per patient's home regimen.  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 (see MAR) Retinal edema: Continue acetazolamide  250 mg twice daily Gout: Continue allopurinol  100 mg daily, holding colchicine  as can contribute to peripheral neuropathy GERD: Continue famotidine  40 mg daily, pantoprazole  40 mg daily (substitute for Dexlansoprazole ) Asthma: Continue Dulera  2 puffs twice daily (substitute for Symbicort ), albuterol  nebulizer PRN, montelukast  10 mg daily Neuropathy: Gabapentin  600 mg TID, hold home duloxetine  given risk of myositis  HTN: continue home olmesartan -amlodipine -hydrochlorothiazide  20-5-12.5 mg daily   FEN/GI: Reg diet, NPO at [facility] PPx: Lovenox  Dispo: CIR pending clinical improvment  Subjective:  Doing okay this morning. Ate some breakfast, no vomiting, improving nausea. Stable weakness, improving  leg pain. Was able to stand with PT  yesterday. Peeing with pure wick. No CP/SOB. No eye pain or new blurriness.   Per patient's mother, patient did fall on her L leg some time last week.   Objective: Temp:  [98 F (36.7 C)-98.5 F (36.9 C)] 98.4 F (36.9 C) (04/21 0749) Pulse Rate:  [107-121] 109 (04/21 0749) Resp:  [18-19] 19 (04/21 0524) BP: (123-143)/(78-85) 127/85 (04/21 0749) SpO2:  [96 %-98 %] 98 % (04/21 0749) Physical Exam: General: No acute distress. Resting comfortably in room. CV: Normal S1/S2. No extra heart sounds. Warm and well-perfused. Pulm: Breathing comfortably on room air. CTAB anteriorly. No increased WOB. Abd: Soft, non-tender, non-distended. Ext: Mild tenderness to palpation of L thigh, reportedly improved.  Skin:  Warm, dry. Neuro: Alert and interactive. No new vision changes. Stable anisocoria, limited reaction to light. Facial sensation intact BL. Facial expressions symmetric. Regular speech. Turns head against resistance BL. Tongue midline. 5/5 strength of upper and lower extremities. Decreased sensation of lower extremities bilaterally.   Laboratory: Most recent CBC Lab Results  Component Value Date   WBC 7.9 07/17/2023   HGB 8.3 (L) 07/17/2023   HCT 26.6 (L) 07/17/2023   MCV 93.3 07/17/2023   PLT 203 07/17/2023   Most recent BMP    Latest Ref Rng & Units 07/17/2023    5:32 AM  BMP  Glucose 70 - 99 mg/dL 89   BUN 6 - 20 mg/dL 7   Creatinine 1.61 - 0.96 mg/dL 0.45   Sodium 409 - 811 mmol/L 135   Potassium 3.5 - 5.1 mmol/L 3.5   Chloride 98 - 111 mmol/L 100   CO2 22 - 32 mmol/L 25   Calcium 8.9 - 10.3 mg/dL 8.8    L femur MRI: diffuse edema within majority of muscles, consistent with nonspecific myositis  Cervical, thoracic MRI: unremarkable, partially imaged bilateral lung opacities   Carey Chapman, MD 07/17/2023, 8:01 AM  PGY-1, [facility] FPTS Intern pager: [phone], text pages welcome Secure chat group [facility] Teaching Service

## 2023-07-17 NOTE — Progress Notes (Signed)
  Echocardiogram 2D Echocardiogram has been performed.  Annis Kinder, RDCS 07/17/2023, 3:02 PM

## 2023-07-17 NOTE — Assessment & Plan Note (Deleted)
 Hg down from 9.5 to 8.7 this AM. Of note, other cell lines also down. May be dilutional from IVF resuscitation. Reticulocyte count also low. - Iron panel with reticulocyte pending - AM CBC

## 2023-07-17 NOTE — Assessment & Plan Note (Addendum)
 Symptomatically improving.  - Cont IVF LR 100ml/hr - Gastric emptying study tomorrow - will be NPO at [facility] - Zofran  prn - AM CMP, replete electrolytes prn  - RD consult

## 2023-07-17 NOTE — Progress Notes (Signed)
 NEUROLOGY CONSULT FOLLOW UP NOTE   Date of service: July 17, 2023 Patient Name: Tiffany Velasquez MRN:  [id number] DOB:  1995/08/25  Interval Hx/subjective  Patient is seen in her room with no family at the bedside.  She has been afebrile and hemodynamically stable overnight.  She reports that she was able to stand with assistance and use the commode this morning.  MRI reveals muscle edema in left thigh. Vitals   Vitals:   07/16/23 2032 07/16/23 2038 07/17/23 0524 07/17/23 0749  BP: 123/79  (!) 143/78 127/85  Pulse: (!) 117  (!) 107 (!) 109  Resp: 18  19   Temp: 98.2 F (36.8 C)  98.5 F (36.9 C) 98.4 F (36.9 C)  TempSrc: Oral  Oral Oral  SpO2: 97% 97% 96% 98%  Weight:      Height:         Body mass index is 48.31 kg/m.  Physical Exam   Constitutional: Well-nourished, well-developed patient in no acute distress Psych: Affect appropriate to situation.  Eyes: No scleral injection.  HENT: No OP obstrucion.  Respiratory: Effort normal, non-labored breathing.    Neurologic Examination   Neuro: Mental Status: Patient is awake, alert, oriented to person, place, month, year, and situation. Patient is able to give a clear and coherent history. No signs of aphasia or neglect Cranial Nerves: II: Visual Fields are full. Left pupil mildly ovoid ~2 mm, nonreactive, right is 4 mm sluggishily reactive to 2  III,IV, VI: EOMI  Visual acuity grossly intact. Endorses blurry vision on the right with monocular vision, states it is stable V: Facial sensation is symmetric to light touch VII: Facial movement is symmetric resting and smiling VIII: Hearing is intact to voice X: Palate elevates symmetrically XI: Shoulder shrug is symmetric. XII: Tongue protrudes midline without atrophy or fasciculations.  Motor: Tone is normal. Bulk is normal.  Elevates bilateral upper and lower extremities antigravity 4/5 bilaterally deltoids otherwise 5/5 throughout UE  Good effort and lower extremity  strength exam today, endorses numbness from neuropathy in lower legs but states strength is improved.  4/5 strength bilateral hip flexors, 5/5 otherwise Sensory: Sensation is symmetric to light touch in arms Bilateral mid calf down reports numbness and pins and needle feeling in feet Deep Tendon Reflexes 4/19: Unable to illicit patellar reflex, adductors present, brachioradialis 2+ and biceps 2+ bilaterally  Gait:  Deferred for safety    Medications  Current Facility-Administered Medications:    albuterol  (PROVENTIL ) (2.5 MG/3ML) 0.083% nebulizer solution 2.5 mg, 2.5 mg, Nebulization, Q6H PRN, Spence, Sarah, DO   allopurinol  (ZYLOPRIM ) tablet 100 mg, 100 mg, Oral, Daily, Spence, Sarah, DO, 100 mg at 07/17/23 2130   azelastine  (ASTELIN ) 0.1 % nasal spray 2 spray, 2 spray, Each Nare, BID, Omar Bibber, DO, 2 spray at 07/17/23 0750   brimonidine  (ALPHAGAN ) 0.2 % ophthalmic solution 1 drop, 1 drop, Both Eyes, TID, Spence, Sarah, DO, 1 drop at 07/17/23 8657   Chlorhexidine  Gluconate Cloth 2 % PADS 6 each, 6 each, Topical, Daily, McDiarmid, Demetra Filter, MD, 6 each at 07/16/23 1024   cyanocobalamin  (VITAMIN B12) injection 1,000 mcg, 1,000 mcg, Intramuscular, Daily, 1,000 mcg at 07/17/23 0801 **FOLLOWED BY** [START ON 07/22/2023] cyanocobalamin  (VITAMIN B12) tablet 1,000 mcg, 1,000 mcg, Oral, Daily, Bhagat, Srishti L, MD   dorzolamide -timolol  (COSOPT ) 2-0.5 % ophthalmic solution 1 drop, 1 drop, Both Eyes, BID, Spence, Sarah, DO, 1 drop at 07/17/23 8469   enoxaparin  (LOVENOX ) injection 70 mg, 70 mg, Subcutaneous, Q24H, Omar Bibber,  DO, 70 mg at 07/17/23 0800   famotidine  (PEPCID ) tablet 40 mg, 40 mg, Oral, QHS, Spence, Sarah, DO, 40 mg at 07/16/23 2112   folic acid  injection 1 mg, 1 mg, Intravenous, Daily, McDiarmid, Demetra Filter, MD, 1 mg at 07/17/23 1610   gabapentin  (NEURONTIN ) capsule 600 mg, 600 mg, Oral, TID, Spence, Sarah, DO, 600 mg at 07/17/23 0803   irbesartan  (AVAPRO ) tablet 150 mg, 150 mg, Oral,  Daily, 150 mg at 07/17/23 0803 **AND** hydrochlorothiazide  (HYDRODIURIL ) tablet 12.5 mg, 12.5 mg, Oral, Daily, McDiarmid, Demetra Filter, MD, 12.5 mg at 07/17/23 9604   lactated ringers  infusion, , Intravenous, Continuous, Ivin Marrow, MD, Last Rate: 100 mL/hr at 07/17/23 0239, New Bag at 07/17/23 0239   latanoprost  (XALATAN ) 0.005 % ophthalmic solution 1 drop, 1 drop, Both Eyes, QHS, Spence, Sarah, DO, 1 drop at 07/16/23 2121   lidocaine  (LMX) 4 % cream, , Topical, TID, Bhagat, Srishti L, MD, 1 Application at 07/17/23 0805   mometasone -formoterol  (DULERA ) 200-5 MCG/ACT inhaler 2 puff, 2 puff, Inhalation, BID, Omar Bibber, DO, 2 puff at 07/17/23 0754   montelukast  (SINGULAIR ) tablet 10 mg, 10 mg, Oral, QHS, Spence, Sarah, DO, 10 mg at 07/16/23 2111   multivitamin with minerals tablet 1 tablet, 1 tablet, Oral, Daily, Bhagat, Srishti L, MD, 1 tablet at 07/17/23 0803   ondansetron  (ZOFRAN -ODT) disintegrating tablet 4 mg, 4 mg, Oral, Q8H PRN, Omar Bibber, DO, 4 mg at 07/15/23 5409   Oral care mouth rinse, 15 mL, Mouth Rinse, PRN, McDiarmid, Demetra Filter, MD   pantoprazole  (PROTONIX ) EC tablet 40 mg, 40 mg, Oral, Q0600, McDiarmid, Demetra Filter, MD, 40 mg at 07/17/23 0548   polyethylene glycol (MIRALAX  / GLYCOLAX ) packet 17 g, 17 g, Oral, Daily, Spence, Sarah, DO, 17 g at 07/17/23 8119   sodium chloride  flush (NS) 0.9 % injection 10-40 mL, 10-40 mL, Intracatheter, PRN, McDiarmid, Demetra Filter, MD   thiamine  (VITAMIN B1) 500 mg in sodium chloride  0.9 % 50 mL IVPB, 500 mg, Intravenous, Q8H, Last Rate: 110 mL/hr at 07/17/23 0546, 500 mg at 07/17/23 0546 **FOLLOWED BY** [START ON 07/18/2023] thiamine  (VITAMIN B1) 250 mg in sodium chloride  0.9 % 50 mL IVPB, 250 mg, Intravenous, Daily **FOLLOWED BY** [START ON 07/24/2023] thiamine  (VITAMIN B1) injection 100 mg, 100 mg, Intravenous, Daily, Bhagat, Srishti L, MD   Vitamin D  (Ergocalciferol ) (DRISDOL ) 1.25 MG (50000 UNIT) capsule 50,000 Units, 50,000 Units, Oral, Q7 days, Dema Filler,  MD, 50,000 Units at 07/15/23 1822  Labs and Diagnostic Imaging   CBC:  Recent Labs  Lab 07/12/23 1515 07/14/23 2020 07/14/23 2041 07/16/23 0200 07/17/23 0532  WBC 5.0 13.3*   < > 8.8 7.9  NEUTROABS 3.2 11.0*  --   --   --   HGB 10.2* 9.5*   < > 8.7* 8.3*  HCT 31.2* 29.8*   < > 27.7* 26.6*  MCV 89.4 92.5   < > 93.0 93.3  PLT 165 171   < > 176 203   < > = values in this interval not displayed.    Basic Metabolic Panel:  Lab Results  Component Value Date   NA 135 07/17/2023   K 3.5 07/17/2023   CO2 25 07/17/2023   GLUCOSE 89 07/17/2023   BUN 7 07/17/2023   CREATININE 0.49 07/17/2023   CALCIUM 8.8 (L) 07/17/2023   GFRNONAA >60 07/17/2023   GFRAA >60 05/09/2019   Lipid Panel:  Lab Results  Component Value Date   LDLCALC 110 (H) 12/19/2022   HgbA1c:  Lab Results  Component Value Date   HGBA1C 4.6 (L) 07/08/2023   Urine Drug Screen:     Component Value Date/Time   LABOPIA POSITIVE (A) 07/08/2023 0131   COCAINSCRNUR NONE DETECTED 07/08/2023 0131   LABBENZ NONE DETECTED 07/08/2023 0131   AMPHETMU NONE DETECTED 07/08/2023 0131   THCU NONE DETECTED 07/08/2023 0131   LABBARB NONE DETECTED 07/08/2023 0131   Lab Results  Component Value Date   VITAMINB12 449 07/15/2023   CKTOTAL 1,250 (H) 07/17/2023   ESRSEDRATE 117 (H) 07/14/2023   CRP 27.2 (H) 07/14/2023    4/14- Protein Electrophoresis Lab Results  Component Value Date   PROT 6.4 (L) 07/17/2023   ALBUMINELP 4.3 07/10/2023   A1GS 0.4 (H) 07/10/2023   A2GS 0.6 07/10/2023   BETS 0.5 07/10/2023   BETA2SER 0.7 (H) 07/10/2023   SPEI  07/10/2023     Comment:     Normal Serum Protein Electrophoresis Pattern. No abnormal protein bands (M-protein) detected.     4/14- Immunofixation Electrophoresis Lab Results  Component Value Date   IMMELINT  07/10/2023     Comment:     . Normal pattern. No monoclonal proteins detected. .    IGASERUM 463 (H) 07/10/2023   IGGSERUM 3,058 (H) 07/10/2023   Copper  125  (normal) B1 <6 (extremely low) -> pending  RPR - nonreactive HIV neg   05/31/2023- TB Quant negative  HCV reflex- pending  Hep B S Ab- reactive Hep B S An- non reactive Hep B core Ab- pending   ANCA w/ reflex- pending Rheumatoid factor <10 TSH 1.25 Vitamin D - 6.17 (low)  Vitamin A  -pending  Vitamin E-Pending  Vitamin K1  -Pending  Anti-Jo Antibody- < 0.2  MMA- Pending   Lab Results  Component Value Date   GGT 68 (H) 07/16/2023   CKTOTAL 1,250 (H) 07/17/2023   CKTOTAL 1,536 (H) 07/16/2023   CKTOTAL 1,018 (H) 07/14/2023   MRI Brain(Personally reviewed): Normal MRI appearance of the Brain.    MRI Lumbar Spine w wo Stable and essentially normal Lumbar MRI, without and with contrast.   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    While she does have an abnormal SPEP, this is difficult to interpret in the setting of IVIG infusions. Indeed antibody testing in general is challenging in this setting due to reduction of both sensitivity and specificity of testing.  For this reason I will hold off on other serological antibody testing at this time, and suggest cautious interpretation of the anti-Jo1 antibody testing that is pending   ESR and CRP are quite high, these are nonspecific markers, however she does seem to have some rhabdomyolysis as well.  In autoimmune diseases, low vitamin D  levels are associated with increased levels of inflammatory markers, but will need to continue to assess for potential autoimmune component of her presentation.   While elevated CK due to myocyte injury can explain her elevated AST/ALT,  GGT indicates there is also some hepatocellular injury -- further workup of which I defer to medicine team.   CK increased today to 1536, and she does have some tenderness on palpation of her thighs so we will also obtain MRI  thigh to evaluate for myositis, consider muscle biopsy if positive and if her symptoms are not improving with vitamin supplementation   She is experiencing urinary retention which may also be secondary to opiate use for pain in the last few weeks, however will need to complete imaging of the cord to rule out any compressive contribution to her symptoms especially given her multiple falls.  MRI of cervical and thoracic spine reveals no abnormalities, and patient has been able to void when sitting on commode.   Regarding IVIG, this is a relatively low risk immunosuppressive treatment to continue given that it is used as a treatment for patients with, for example, hypogammaglobulinemia.   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  MRI of left thigh revealed edema of all muscles.  Will consider muscle biopsy, however, patient states that her strength is improving so this may not be necessary.   Differential is broad at this time although primarily nutritional etiologies are at the top of my list, will continue to entertain possibility of infectious/inflammatory etiologies as discussed  Recommendations  - High dose thiamine  replacement noting that deficiency has been at times associated with rhabdomyolysis - Stop duloxetine  as rarely this can contribute to rhabdomyolysis, consider retrial of this medication after acute issues are addressed - Continue B12 injections for 7 days to rapidly increase level, then 1000 mcg daily - Continue IV Folate - Okay to continue IVIG at this time as discussed above - Agree with  vitamin D  supplementation - Continue multivitamin - Hold colchicine  - No indication for muscle biopsy at this time 2/2 patient improvement - Continue Lidocaine  gel to improve pain control in the feet  - Appreciate management of gastritis and other comorbidities per primary team - PT/OT  - Neurology will follow ______________________________________________________________________  Patient seen by NP and then by MD, MD to edit note as needed. Cortney E Bucky Cardinal , MSN, AGACNP-BC [facility] See Amion for schedule and pager information 07/17/2023 9:55 AM    Attending Neurohospitalist Addendum Patient seen and examined with APP/Resident. Agree with the history and physical as documented above. Agree with the plan as documented, which I helped formulate. I have edited the note above to reflect my full findings and recommendations. I have independently reviewed the chart, obtained history, review of systems and examined the patient.I have personally reviewed pertinent head/neck/spine imaging (CT/MRI). Please feel free to call with any questions.  -- Greg Leaks, MD [facility] [phone]  If 7pm- 7am, please page neurology on call as listed in AMION.

## 2023-07-17 NOTE — Progress Notes (Signed)
 Inpatient Rehab Admissions:  Inpatient Rehab Consult received.  I met with patient and her mother Adah Acron at the bedside for rehabilitation assessment and to discuss goals and expectations of an inpatient rehab admission.  Discussed average length of stay, insurance authorization requirement and discharge home after completion of CIR. Both acknowledged understanding. Pt is interested in pursuing CIR. Pt's mother is supportive. Pt's mother confirmed that she and other family will be able to provide 24/7 support for pt after discharge. Will continue to follow.  Signed: Artemus Larsen, MS, CCC-SLP Admissions Coordinator [phone]

## 2023-07-17 NOTE — Progress Notes (Signed)

## 2023-07-17 NOTE — TOC CM/SW Note (Signed)
 Transition of Care [facility]) - Inpatient Brief Assessment   Patient Details  Name: Tiffany Velasquez MRN: [id number] Date of Birth: [birth year]  Transition of Care [facility]) CM/SW Contact:    Tom-Johnson, Angelique Ken, RN Phone Number: 07/17/2023, 9:59 AM   Clinical Narrative:  Patient presented to the ED from her PCP with worsening N/V and Weakness to bilateral Lower Extremities with Fall. Patient was  recently admitted with same diagnosis. Treated, symptoms improved and was discharged home. Currently on IV Fluids, IV Thiamine . Neurology following.  CIR recommended, patient a potential candidate and will assess for possible admission.   Patient not Medically ready for discharge.  CM will continue to follow as patient progresses with care towards discharge.           Transition of Care Asessment: Insurance and Status: Insurance coverage has been reviewed Patient has primary care physician: Yes Home environment has been reviewed: Yes Prior level of function:: Modified Independent- Recent progressive Weakness. Prior/Current Home Services: Current home services Social Drivers of Health Review: SDOH reviewed no interventions necessary Readmission risk has been reviewed: Yes Transition of care needs: transition of care needs identified, TOC will continue to follow

## 2023-07-17 NOTE — Assessment & Plan Note (Signed)
>>  ASSESSMENT AND PLAN FOR TRANSAMINITIS WRITTEN ON 07/17/2023  3:31 PM BY Carey Chapman, MD  AST and ALT elevated on admission. AST/ALT ratio of ~3. GGT also elevated. Denies any alcohol use. RUQ US  with gallbladder sludge, otherwise unremarkable.  - CTM with CMP

## 2023-07-17 NOTE — Assessment & Plan Note (Addendum)
 Patient s/p 2 I/O caths for retention. May be 2/2 to opiate use. Cervical/thoracic MRI unremarkable.  - Bladder scans q8h - Pure wick in place

## 2023-07-18 DIAGNOSIS — R29898 Other symptoms and signs involving the musculoskeletal system: Secondary | ICD-10-CM | POA: Diagnosis not present

## 2023-07-18 DIAGNOSIS — E519 Thiamine deficiency, unspecified: Secondary | ICD-10-CM | POA: Diagnosis not present

## 2023-07-18 DIAGNOSIS — R700 Elevated erythrocyte sedimentation rate: Secondary | ICD-10-CM | POA: Diagnosis not present

## 2023-07-18 DIAGNOSIS — R7982 Elevated C-reactive protein (CRP): Secondary | ICD-10-CM | POA: Diagnosis not present

## 2023-07-18 DIAGNOSIS — E538 Deficiency of other specified B group vitamins: Secondary | ICD-10-CM | POA: Diagnosis not present

## 2023-07-18 DIAGNOSIS — E559 Vitamin D deficiency, unspecified: Secondary | ICD-10-CM

## 2023-07-18 DIAGNOSIS — M609 Myositis, unspecified: Secondary | ICD-10-CM

## 2023-07-18 DIAGNOSIS — R748 Abnormal levels of other serum enzymes: Secondary | ICD-10-CM | POA: Diagnosis not present

## 2023-07-18 LAB — VITAMIN B1: Vitamin B1 (Thiamine): <6 nmol/L — ABNORMAL LOW (ref 8–30)

## 2023-07-18 LAB — COMPREHENSIVE METABOLIC PANEL WITH GFR
ALT: 96 U/L — ABNORMAL HIGH (ref 0–44)
AST: 274 U/L — ABNORMAL HIGH (ref 15–41)
Albumin: 2 g/dL — ABNORMAL LOW (ref 3.5–5.0)
Alkaline Phosphatase: 74 U/L (ref 38–126)
Anion gap: 7 (ref 5–15)
BUN: 5 mg/dL — ABNORMAL LOW (ref 6–20)
CO2: 27 mmol/L (ref 22–32)
Calcium: 8.7 mg/dL — ABNORMAL LOW (ref 8.9–10.3)
Chloride: 101 mmol/L (ref 98–111)
Creatinine, Ser: 0.43 mg/dL — ABNORMAL LOW (ref 0.44–1.00)
GFR, Estimated: >60 mL/min (ref >60)
Glucose, Bld: 108 mg/dL — ABNORMAL HIGH (ref 70–99)
Potassium: 3.6 mmol/L (ref 3.5–5.1)
Sodium: 135 mmol/L (ref 135–145)
Total Bilirubin: 0.7 mg/dL (ref 0.0–1.2)
Total Protein: 7.5 g/dL (ref 6.5–8.1)

## 2023-07-18 LAB — CBC
HCT: 27 % — ABNORMAL LOW (ref 36.0–46.0)
Hemoglobin: 8.5 g/dL — ABNORMAL LOW (ref 12.0–15.0)
MCH: 29.4 pg (ref 26.0–34.0)
MCHC: 31.5 g/dL (ref 30.0–36.0)
MCV: 93.4 fL (ref 80.0–100.0)
Platelets: 202 10*3/uL (ref 150–400)
RBC: 2.89 MIL/uL — ABNORMAL LOW (ref 3.87–5.11)
RDW: 22.9 % — ABNORMAL HIGH (ref 11.5–15.5)
WBC: 4.5 10*3/uL (ref 4.0–10.5)
nRBC: 0.7 % — ABNORMAL HIGH (ref 0.0–0.2)

## 2023-07-18 LAB — ANCA SCREEN W REFLEX TITER: ANCA SCREEN: NEGATIVE

## 2023-07-18 LAB — PROTEIN ELECTROPHORESIS, SERUM
Albumin ELP: 4.3 g/dL (ref 3.8–4.8)
Alpha 1: 0.4 g/dL — ABNORMAL HIGH (ref 0.2–0.3)
Alpha 2: 0.6 g/dL (ref 0.5–0.9)
Beta 2: 0.7 g/dL — ABNORMAL HIGH (ref 0.2–0.5)
Beta Globulin: 0.5 g/dL (ref 0.4–0.6)
Gamma Globulin: 2.9 g/dL — ABNORMAL HIGH (ref 0.8–1.7)
Total Protein: 9.4 g/dL — ABNORMAL HIGH (ref 6.1–8.1)

## 2023-07-18 LAB — VITAMIN E
Vitamin E (Alpha Tocopherol): 7.8 mg/L (ref 5.9–19.4)
Vitamin E(Gamma Tocopherol): 3.4 mg/L (ref 0.7–4.9)

## 2023-07-18 LAB — COPPER, SERUM: Copper: 125 ug/dL (ref 70–175)

## 2023-07-18 LAB — VITAMIN A: Vitamin A (Retinoic Acid): 10 ug/dL — ABNORMAL LOW (ref 18.9–57.3)

## 2023-07-18 LAB — HCV AB W REFLEX TO QUANT PCR: HCV Ab: NONREACTIVE

## 2023-07-18 LAB — IMMUNOFIXATION ELECTROPHORESIS
IgG (Immunoglobin G), Serum: 3058 mg/dL — ABNORMAL HIGH (ref 600–1640)
IgM, Serum: 110 mg/dL (ref 50–300)
Immunoglobulin A: 463 mg/dL — ABNORMAL HIGH (ref 47–310)

## 2023-07-18 LAB — CK: Total CK: 881 U/L — ABNORMAL HIGH (ref 38–234)

## 2023-07-18 LAB — HCV INTERPRETATION

## 2023-07-18 LAB — FOLATE: Folate: 3.3 ng/mL — ABNORMAL LOW

## 2023-07-18 LAB — HEPATITIS B CORE ANTIBODY, TOTAL: HEP B CORE AB: POSITIVE — AB

## 2023-07-18 MED ORDER — AMOXICILLIN-POT CLAVULANATE 875-125 MG PO TABS
1.0000 | ORAL_TABLET | Freq: Two times a day (BID) | ORAL | Status: DC
  Administered 2023-07-18 – 2023-07-20 (×4): 1 via ORAL
  Filled 2023-07-18 (×4): qty 1

## 2023-07-18 MED ORDER — ACETAZOLAMIDE 250 MG PO TABS
500.0000 mg | ORAL_TABLET | Freq: Two times a day (BID) | ORAL | Status: DC
  Administered 2023-07-18 – 2023-07-20 (×6): 500 mg via ORAL
  Filled 2023-07-18 (×7): qty 2

## 2023-07-18 MED ORDER — VITAMIN A 15 MG/ML IM SOLN: 100000.0000 [IU] | Freq: Every day | INTRAMUSCULAR | Status: DC

## 2023-07-18 MED ORDER — ACETAMINOPHEN 500 MG PO TABS
1000.0000 mg | ORAL_TABLET | Freq: Once | ORAL | Status: AC
  Administered 2023-07-18: 1000 mg via ORAL
  Filled 2023-07-18: qty 2

## 2023-07-18 MED ORDER — OXYCODONE HCL 5 MG PO TABS
2.5000 mg | ORAL_TABLET | Freq: Once | ORAL | Status: AC
  Administered 2023-07-18: 2.5 mg via ORAL
  Filled 2023-07-18: qty 1

## 2023-07-18 NOTE — Progress Notes (Signed)
 Inpatient Rehabilitation Admissions Coordinator   I await further progress with therapy before pursuing insurance approval for CIR admit. Please note Dr Delfin Fell consult on 4/21. I met with patient at bedside.  Jeannetta Millman, RN, MSN Rehab Admissions Coordinator [phone] 07/18/2023 12:31 PM

## 2023-07-18 NOTE — Assessment & Plan Note (Signed)
 AST and ALT elevated on admission. AST/ALT ratio of ~3. GGT also elevated. Denies any alcohol use. RUQ US  with gallbladder sludge, otherwise unremarkable.  - CTM with CMP

## 2023-07-18 NOTE — Assessment & Plan Note (Addendum)
 Persistent today. No chest pain or dyspnea though with new oxygen requirement overnight. Cervical/thoracic MRI with incidental lung findings c/f pneumonia.  Echo with EF 55, G1DD, mod-severe tricuspid regurg.  - CT Chest completed, pending read - CCM

## 2023-07-18 NOTE — Progress Notes (Signed)
 Physical Therapy Treatment Patient Details Name: Tiffany Velasquez MRN: [id number] DOB: [birth year] Today's Date: 07/18/2023   History of Present Illness Pt is 28 y.o. female presenting 07/14/23 with 2 months of progressive weakness. Pt has fallen 6 times in the past 2 weeks. She was recently admitted 4/11 for generalized weakness as well; found to have significant nutritional deficiencies (including B12) in the setting of an aggressive weight loss program;  PMH asthma, glaucoma, panuveitis, HTN, anemia and peripheral neuropathy, obesity.    PT Comments  Pt motivated to get to [facility] for bowel mvmt as she does not feel like she can go on bed pan. Mod A needed to come to EOB. Worked on controlled cervical extension in sitting. Pt stood to stedy from elevated bed and flaps of stedy with max A +2. Needed max A +3 to stand from lower surface of BSC. Pt unable to stand fully for cleanup but returned to SL in bed for this. Will need to use bed pan for toileting with nursing and maximove for bed to recliner. 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 Patient will benefit from intensive inpatient follow-up therapy, >3 hours/day. PT will continue to follow.     If plan is discharge home, recommend the following: Two people to help with walking and/or transfers;Two people to help with bathing/dressing/bathroom   Can travel by private vehicle        Equipment Recommendations  Rolling walker (2 wheels);Wheelchair (measurements PT);Wheelchair cushion (measurements PT);Other (comment) (drop arm BSC, possibly sliding board)    Recommendations for Other Services Rehab consult     Precautions / Restrictions Precautions Precautions: Fall Recall of Precautions/Restrictions: Intact Precaution/Restrictions Comments: 6 falls recently Restrictions Weight Bearing Restrictions Per Provider Order: No     Mobility  Bed Mobility Overal bed mobility: Needs  Assistance Bed Mobility: Rolling, Sidelying to Sit, Sit to Supine Rolling: Supervision Sidelying to sit: Mod assist   Sit to supine: Mod assist   General bed mobility comments: Mod A to raise trunk, pt able to roll into sidelying without physical assist. Needs mod A for LE's into bed with return to supine    Transfers Overall transfer level: Needs assistance Equipment used: Ambulation equipment used Transfers: Sit to/from Stand, Bed to chair/wheelchair/BSC Sit to Stand: +2 physical assistance, Max assist, +2 safety/equipment, Via lift equipment           General transfer comment: Pt needed max A +2 to stand from elevated bed, needed max A +3 to stand from [facility] Transfer via Lift Equipment: Stedy  Ambulation/Gait               General Gait Details: unable to take steps in standing   Stairs             Wheelchair Mobility     Tilt Bed    Modified Rankin (Stroke Patients Only)       Balance Overall balance assessment: Needs assistance Sitting-balance support: No upper extremity supported, Feet supported Sitting balance-Leahy Scale: Poor Sitting balance - Comments: CGA to Mod A, strong cervical flexion. losing balance posteriorly at times.   Standing balance support: Bilateral upper extremity supported, Reliant on assistive device for balance Standing balance-Leahy Scale: Zero Standing balance comment: unable to achieve ful standing, max A +2/3 needed for support in partial stand  Communication Communication Communication: No apparent difficulties  Cognition Arousal: Alert Behavior During Therapy: Anxious, Lability   PT - Cognitive impairments: No apparent impairments                       PT - Cognition Comments: Pt with periods of tearfulness Following commands: Intact      Cueing Cueing Techniques: Verbal cues  Exercises General Exercises - Lower Extremity Short Arc Quad:  (itermittent  resistance) Other Exercises Other Exercises: cervical extension x10    General Comments General comments (skin integrity, edema, etc.): Pt able to have bowel mvmt on BSC, unable to maintain standing long enough for clean up, had to return to SL for clean up.      Pertinent Vitals/Pain Pain Assessment Pain Assessment: Faces Faces Pain Scale: Hurts even more Pain Location: back and hips Pain Descriptors / Indicators: Discomfort, Sharp Pain Intervention(s): Limited activity within patient's tolerance, Monitored during session    Home Living                          Prior Function            PT Goals (current goals can now be found in the care plan section) Acute Rehab PT Goals Patient Stated Goal: to walk PT Goal Formulation: With patient Time For Goal Achievement: 07/30/23 Potential to Achieve Goals: Fair Progress towards PT goals: Progressing toward goals    Frequency    Min 2X/week      PT Plan      Co-evaluation              AM-PAC PT "6 Clicks" Mobility   Outcome Measure  Help needed turning from your back to your side while in a flat bed without using bedrails?: None Help needed moving from lying on your back to sitting on the side of a flat bed without using bedrails?: A Lot Help needed moving to and from a bed to a chair (including a wheelchair)?: Total Help needed standing up from a chair using your arms (e.g., wheelchair or bedside chair)?: Total Help needed to walk in hospital room?: Total Help needed climbing 3-5 steps with a railing? : Total 6 Click Score: 10    End of Session   Activity Tolerance: Patient limited by fatigue Patient left: in bed;with call bell/phone within reach Nurse Communication: Mobility status PT Visit Diagnosis: Unsteadiness on feet (R26.81);Other abnormalities of gait and mobility (R26.89);Repeated falls (R29.6);Muscle weakness (generalized) (M62.81);History of falling (Z91.81);Difficulty in walking, not  elsewhere classified (R26.2);Other symptoms and signs involving the nervous system (R29.898) Pain - Right/Left:  (bil) Pain - part of body: Ankle and joints of foot (back)     Time: 4098-1191 PT Time Calculation (min) (ACUTE ONLY): 31 min  Charges:    $Therapeutic Activity: 23-37 mins PT General Charges $$ ACUTE PT VISIT: 1 Visit                     Amey Ka, PT  Acute Rehab Services Secure chat preferred Office [phone]    [place] L Roselle Norton 07/18/2023, 1:50 PM

## 2023-07-18 NOTE — Assessment & Plan Note (Signed)
>>  ASSESSMENT AND PLAN FOR TRANSAMINITIS WRITTEN ON 07/18/2023  8:02 AM BY Carey Chapman, MD  AST and ALT elevated on admission. AST/ALT ratio of ~3. GGT also elevated. Denies any alcohol use. RUQ US  with gallbladder sludge, otherwise unremarkable.  - CTM with CMP

## 2023-07-18 NOTE — Assessment & Plan Note (Addendum)
 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 Per pharmacy, patient due for IVIG infusion on 4/19. Will order IVIG infusion per patient's home regimen.  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

## 2023-07-18 NOTE — Assessment & Plan Note (Addendum)
 Overall LE weakness likely secondary to vitamin deficiency per Neuro, though also concern for nonspecific myositis as well.  Per neurology, abnormal SPEP results difficult to interpret in setting of chronic IVIG infusions (for chorioretinal inflammation) and recommend holding off on other serological antibody testing at this time. CK remains elevated, downtrending, will continue to trend.  Vit D low, which may play a role in myositis. Anti-jo negative. Cervical/thoracic MRI unremarkable. L femur MRI with diffuse edema of muscles consistent with nonspecific myositis, may be secondary to recent fall vs other inflammatory process given elevated ESR. Echo with EF 55, G1DD, mod-severe tricuspid regurg.  - Neurology following, appreciate recs - May consider L upper leg muscle biopsy if symptoms not improving with vitamin repletion  - Continue to trend CK  - Continue B12 injections, IV folate, IV thiamine , MVI, Vit D supplemenation - Follow up Vit A, E, K - PT/OT to treat - recommending CIR at this time - AM CBC, CMP

## 2023-07-18 NOTE — Plan of Care (Addendum)
 Visited patient at bedside after desaturation to 68% (now recovered to low 90s), development of tachycardia to 127 (now downtrending) and addition of 3L  early this AM. Patient woke to voice easily in no acute distress. Grossly alert and oriented. No new concerns. Denied chest pain and shortness of breath. Lung sounds CTAB in posterior fields. Discussed briefly how she had been moved to a more comfortable bed. Declined further needs at this time.

## 2023-07-18 NOTE — Assessment & Plan Note (Addendum)
 No emesis documented in last 24 hr.  - Gastric emptying study today - Zofran  prn - AM CMP, replete electrolytes prn  - RD consulted

## 2023-07-18 NOTE — Progress Notes (Signed)
 NEUROLOGY CONSULT FOLLOW UP NOTE   Date of service: July 18, 2023 Patient Name: Tiffany Velasquez MRN:  [id number] DOB:  Jul 23, 1995  Interval Hx/subjective  Patient is seen in her room with no family at the bedside.  She has been hemodynamically stable and afebrile overnight.  She reports that she has not been able to get up to the commode and required extensive assistance yesterday. Vitals   Vitals:   07/18/23 0617 07/18/23 0619 07/18/23 0622 07/18/23 0746  BP: 129/85 129/85    Pulse: (!) 127 (!) 123 (!) 120 (!) 118  Resp: 20 20 18 20   Temp: 98 F (36.7 C) 98 F (36.7 C)    TempSrc:      SpO2: (S) (!) 68% 96% 94%   Weight:      Height:         Body mass index is 48.51 kg/m.  Physical Exam   Constitutional: Well-nourished, well-developed patient in no acute distress Psych: Affect appropriate to situation.  Eyes: No scleral injection.  HENT: No OP obstrucion.  Respiratory: Effort normal, non-labored breathing.    Neurologic Examination   Neuro: Mental Status: Patient is awake, alert, oriented to person, place, month, year, and situation. Patient is able to give a clear and coherent history. No signs of aphasia or neglect Cranial Nerves: II: Visual Fields are full. Left pupil mildly ovoid ~2 mm, nonreactive, right is 4 mm sluggishily reactive to 2  III,IV, VI: EOMI  Visual acuity grossly intact. Endorses blurry vision on the right with monocular vision, states it is stable V: Facial sensation is symmetric to light touch VII: Facial movement is symmetric resting and smiling VIII: Hearing is intact to voice X: Palate elevates symmetrically XI: Shoulder shrug is symmetric. XII: Tongue protrudes midline without atrophy or fasciculations.  Motor: Tone is normal. Bulk is normal.  Elevates bilateral upper and lower extremities antigravity 5/5 strength throughout upper extremities proximal and distal Good effort and lower extremity strength exam today, endorses numbness from  neuropathy in lower legs but states strength is improved.  4/5 strength bilateral hip flexors, 5/5 otherwise Sensory: Sensation is symmetric to light touch in arms Bilateral mid calf down reports numbness and pins and needle feeling in feet Deep Tendon Reflexes 4/19: Unable to illicit patellar reflex, adductors present, brachioradialis 2+ and biceps 2+ bilaterally  Gait:  Deferred for safety    Medications  Current Facility-Administered Medications:    albuterol  (PROVENTIL ) (2.5 MG/3ML) 0.083% nebulizer solution 2.5 mg, 2.5 mg, Nebulization, Q6H PRN, Spence, Sarah, DO   allopurinol  (ZYLOPRIM ) tablet 100 mg, 100 mg, Oral, Daily, Spence, Sarah, DO, 100 mg at 07/17/23 6213   azelastine  (ASTELIN ) 0.1 % nasal spray 2 spray, 2 spray, Each Nare, BID, Omar Bibber, DO, 2 spray at 07/17/23 0750   brimonidine  (ALPHAGAN ) 0.2 % ophthalmic solution 1 drop, 1 drop, Both Eyes, TID, Spence, Sarah, DO, 1 drop at 07/17/23 2141   Chlorhexidine  Gluconate Cloth 2 % PADS 6 each, 6 each, Topical, Daily, McDiarmid, Demetra Filter, MD, 6 each at 07/17/23 1209   cyanocobalamin  (VITAMIN B12) injection 1,000 mcg, 1,000 mcg, Intramuscular, Daily, 1,000 mcg at 07/17/23 0801 **FOLLOWED BY** [START ON 07/22/2023] cyanocobalamin  (VITAMIN B12) tablet 1,000 mcg, 1,000 mcg, Oral, Daily, Bhagat, Srishti L, MD   dorzolamide -timolol  (COSOPT ) 2-0.5 % ophthalmic solution 1 drop, 1 drop, Both Eyes, BID, Spence, Sarah, DO, 1 drop at 07/17/23 2142   enoxaparin  (LOVENOX ) injection 70 mg, 70 mg, Subcutaneous, Q24H, Omar Bibber, DO, 70 mg at 07/17/23 0800  famotidine  (PEPCID ) tablet 40 mg, 40 mg, Oral, QHS, Spence, Sarah, DO, 40 mg at 07/17/23 2139   folic acid  injection 1 mg, 1 mg, Intravenous, Daily, McDiarmid, Demetra Filter, MD, 1 mg at 07/17/23 1610   gabapentin  (NEURONTIN ) capsule 600 mg, 600 mg, Oral, TID, Spence, Sarah, DO, 600 mg at 07/17/23 2139   irbesartan  (AVAPRO ) tablet 150 mg, 150 mg, Oral, Daily, 150 mg at 07/17/23 0803 **AND**  hydrochlorothiazide  (HYDRODIURIL ) tablet 12.5 mg, 12.5 mg, Oral, Daily, McDiarmid, Demetra Filter, MD, 12.5 mg at 07/17/23 0803   ibuprofen  (ADVIL ) tablet 600 mg, 600 mg, Oral, Q6H PRN, Carey Chapman, MD, 600 mg at 07/17/23 1223   Immune Globulin  10% (PRIVIGEN ) IV infusion 80 g, 80 g, Intravenous, Q24H, McDiarmid, Demetra Filter, MD, Stopped at 07/17/23 1955   latanoprost  (XALATAN ) 0.005 % ophthalmic solution 1 drop, 1 drop, Both Eyes, QHS, Spence, Sarah, DO, 1 drop at 07/17/23 2142   lidocaine  (LMX) 4 % cream, , Topical, TID, Bhagat, Srishti L, MD, 1 Application at 07/17/23 1505   mometasone -formoterol  (DULERA ) 200-5 MCG/ACT inhaler 2 puff, 2 puff, Inhalation, BID, Omar Bibber, DO, 2 puff at 07/18/23 9604   montelukast  (SINGULAIR ) tablet 10 mg, 10 mg, Oral, QHS, Spence, Sarah, DO, 10 mg at 07/17/23 2139   multivitamin with minerals tablet 1 tablet, 1 tablet, Oral, Daily, Bhagat, Srishti L, MD, 1 tablet at 07/17/23 0803   ondansetron  (ZOFRAN -ODT) disintegrating tablet 4 mg, 4 mg, Oral, Q8H PRN, Omar Bibber, DO, 4 mg at 07/15/23 5409   Oral care mouth rinse, 15 mL, Mouth Rinse, PRN, McDiarmid, Demetra Filter, MD   pantoprazole  (PROTONIX ) EC tablet 40 mg, 40 mg, Oral, Q0600, McDiarmid, Demetra Filter, MD, 40 mg at 07/17/23 0548   polyethylene glycol (MIRALAX  / GLYCOLAX ) packet 17 g, 17 g, Oral, Daily, Spence, Sarah, DO, 17 g at 07/17/23 0802   protein supplement (ENSURE MAX) liquid, 11 oz, Oral, BID, McDiarmid, Demetra Filter, MD   sodium chloride  flush (NS) 0.9 % injection 10-40 mL, 10-40 mL, Intracatheter, PRN, McDiarmid, Demetra Filter, MD   [COMPLETED] thiamine  (VITAMIN B1) 500 mg in sodium chloride  0.9 % 50 mL IVPB, 500 mg, Intravenous, Q8H, Last Rate: 110 mL/hr at 07/17/23 2148, 500 mg at 07/17/23 2148 **FOLLOWED BY** thiamine  (VITAMIN B1) 250 mg in sodium chloride  0.9 % 50 mL IVPB, 250 mg, Intravenous, Daily **FOLLOWED BY** [START ON 07/24/2023] thiamine  (VITAMIN B1) injection 100 mg, 100 mg, Intravenous, Daily, Bhagat, Srishti L, MD    Vitamin D  (Ergocalciferol ) (DRISDOL ) 1.25 MG (50000 UNIT) capsule 50,000 Units, 50,000 Units, Oral, Q7 days, Dema Filler, MD, 50,000 Units at 07/15/23 1822  Labs and Diagnostic Imaging   CBC:  Recent Labs  Lab 07/12/23 1515 07/14/23 2020 07/14/23 2041 07/17/23 0532 07/18/23 0412  WBC 5.0 13.3*   < > 7.9 4.5  NEUTROABS 3.2 11.0*  --   --   --   HGB 10.2* 9.5*   < > 8.3* 8.5*  HCT 31.2* 29.8*   < > 26.6* 27.0*  MCV 89.4 92.5   < > 93.3 93.4  PLT 165 171   < > 203 202   < > = values in this interval not displayed.    Basic Metabolic Panel:  Lab Results  Component Value Date   NA 135 07/18/2023   K 3.6 07/18/2023   CO2 27 07/18/2023   GLUCOSE 108 (H) 07/18/2023   BUN 5 (L) 07/18/2023   CREATININE 0.43 (L) 07/18/2023   CALCIUM 8.7 (L) 07/18/2023   GFRNONAA >60  07/18/2023   GFRAA >60 05/09/2019   Lipid Panel:  Lab Results  Component Value Date   LDLCALC 110 (H) 12/19/2022   HgbA1c:  Lab Results  Component Value Date   HGBA1C 4.6 (L) 07/08/2023   Urine Drug Screen:     Component Value Date/Time   LABOPIA POSITIVE (A) 07/08/2023 0131   COCAINSCRNUR NONE DETECTED 07/08/2023 0131   LABBENZ NONE DETECTED 07/08/2023 0131   AMPHETMU NONE DETECTED 07/08/2023 0131   THCU NONE DETECTED 07/08/2023 0131   LABBARB NONE DETECTED 07/08/2023 0131   Lab Results  Component Value Date   VITAMINB12 449 07/15/2023   CKTOTAL 881 (H) 07/18/2023   ESRSEDRATE 117 (H) 07/14/2023   CRP 27.2 (H) 07/14/2023    4/14- Protein Electrophoresis Lab Results  Component Value Date   PROT 7.5 07/18/2023   ALBUMINELP 4.3 07/10/2023   A1GS 0.4 (H) 07/10/2023   A2GS 0.6 07/10/2023   BETS 0.5 07/10/2023   BETA2SER 0.7 (H) 07/10/2023   SPEI  07/10/2023     Comment:     Normal Serum Protein Electrophoresis Pattern. No abnormal protein bands (M-protein) detected.     4/14- Immunofixation Electrophoresis Lab Results  Component Value Date   IMMELINT  07/10/2023     Comment:      . Normal pattern. No monoclonal proteins detected. .    IGASERUM 463 (H) 07/10/2023   IGGSERUM 3,058 (H) 07/10/2023   Copper  125 (normal) B1 <6 (extremely low) -> pending  RPR - nonreactive HIV neg   05/31/2023- TB Quant negative  HCV reflex- pending  Hep B S Ab- reactive Hep B S An- non reactive Hep B core Ab- positive  ANCA w/ reflex- pending Rheumatoid factor <10 TSH 1.25 Vitamin D - 6.17 (low)  Vitamin A  -pending  Vitamin E-Pending  Vitamin K1  -Pending  Anti-Jo Antibody- < 0.2  MMA- Pending   Lab Results  Component Value Date   GGT 68 (H) 07/16/2023   CKTOTAL 881 (H) 07/18/2023   CKTOTAL 1,250 (H) 07/17/2023   CKTOTAL 1,536 (H) 07/16/2023   MRI Brain(Personally reviewed): Normal MRI appearance of the Brain.    MRI Lumbar Spine w wo Stable and essentially normal Lumbar MRI, without and with contrast.   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    While she does have an abnormal SPEP, this is difficult to interpret in the setting of IVIG infusions. Indeed antibody testing in general is challenging in this setting due to reduction of both sensitivity and specificity of testing.  For this reason I will hold off on other serological antibody testing at this time, and suggest cautious interpretation of the anti-Jo1 antibody testing that is pending   ESR and CRP are quite high, these are nonspecific markers, however she does seem to have some rhabdomyolysis as well.  In autoimmune diseases, low vitamin D  levels are associated with increased levels of inflammatory markers, but will need to continue to assess  for potential autoimmune component of her presentation.   While elevated CK due to myocyte injury can explain her elevated AST/ALT, GGT indicates there is also some hepatocellular injury -- further workup of which I defer to medicine  team.   CK increased today to 1536, and she does have some tenderness on palpation of her thighs so we will also obtain MRI thigh to evaluate for myositis, consider muscle biopsy if positive and if her symptoms are not improving with vitamin supplementation   She is experiencing urinary retention which may also be secondary to opiate use for pain in the last few weeks, however will need to complete imaging of the cord to rule out any compressive contribution to her symptoms especially given her multiple falls.  MRI of cervical and thoracic spine reveals no abnormalities, and patient has been able to void when sitting on commode.   Regarding IVIG, this is a relatively low risk immunosuppressive treatment to continue given that it is used as a treatment for patients with, for example, hypogammaglobulinemia.   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  MRI of left thigh revealed edema of all muscles.  Muscle biopsy is considered, but patient appears to be improving and has better upper extremity strength on exam, so will not need to do this.   Deficits are likely due to nutritional deficiency.  Expect that they will improve, but this will take weeks to months.  Patient's laboratory values will improve before her symptoms do.  Recommendations  - High dose thiamine  replacement noting that deficiency has been at times associated with rhabdomyolysis - Stop duloxetine  as rarely this can contribute to rhabdomyolysis, consider retrial of this medication after acute issues are addressed - Continue B12 injections  for 7 days to rapidly increase level, then 1000 mcg daily - Continue IV Folate - Okay to continue IVIG at this time as discussed above - Agree with vitamin D  supplementation - Continue multivitamin - Hold colchicine  - No indication for muscle biopsy at this time 2/2 patient improvement - Continue Lidocaine  gel to improve pain control in the feet  - Appreciate management of gastritis and other comorbidities per primary team - PT/OT  - Neurology will sign off, please call with questions or concerns ______________________________________________________________________  Patient seen by NP and then by MD, MD to edit note as needed. Lula Kolton E Bucky Cardinal , MSN, AGACNP-BC [facility] See Amion for schedule and pager information 07/18/2023 10:45 AM

## 2023-07-18 NOTE — Assessment & Plan Note (Addendum)
 Hg stable 8.3>8.5 this AM. Of note, other cell lines also down. Elevated ferritin, suspect inflammatory. Normal iron.  - Recheck CBC as needed

## 2023-07-18 NOTE — Assessment & Plan Note (Signed)
>>  ASSESSMENT AND PLAN FOR GENERALIZED WEAKNESS  MYOSITIS WRITTEN ON 07/18/2023  1:20 PM BY DIONA PERKINS, MD  Overall LE weakness likely secondary to vitamin deficiency per Neuro, though also concern for nonspecific myositis as well.  Per neurology, abnormal SPEP results difficult to interpret in setting of chronic IVIG infusions (for chorioretinal inflammation) and recommend holding off on other serological antibody testing at this time. CK remains elevated, downtrending, will continue to trend.  Vit D low, which may play a role in myositis. Anti-jo negative. Cervical/thoracic MRI unremarkable. L femur MRI with diffuse edema of muscles consistent with nonspecific myositis, may be secondary to recent fall vs other inflammatory process given elevated ESR. Echo with EF 55, G1DD, mod-severe tricuspid regurg.  - Neurology following, appreciate recs - May consider L upper leg muscle biopsy if symptoms not improving with vitamin repletion  - Continue to trend CK  - Continue B12 injections, IV folate, IV thiamine , MVI, Vit D supplemenation - Follow up Vit A, E, K - PT/OT to treat - recommending CIR at this time - AM CBC, CMP

## 2023-07-18 NOTE — Assessment & Plan Note (Signed)
 Patient s/p 2 I/O caths for retention. May be 2/2 to opiate use. Cervical/thoracic MRI unremarkable.  - Bladder scans q8h - Pure wick in place

## 2023-07-18 NOTE — Progress Notes (Signed)
 Daily Progress Note Intern Pager: [phone]  Patient name: Tiffany Velasquez Medical record number: [id number] Date of birth: 13-Dec-1995 Age: 28 y.o. Gender: female  Primary Care Provider: Wilhemena Harbour, MD Consultants: Neurology Code Status: Full  Pt Overview and Major Events to Date:  4/18: Admitted  Assessment and Plan:  Tiffany Velasquez 28 y.o. 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  Neurology on board.  PMH includes neuropathy, vitamin deficiencies, abnormal immunoglobulins, chorioretinal inflammation.  Assessment & Plan Generalized weakness  Myositis Overall LE weakness likely secondary to vitamin deficiency per Neuro, though also concern for nonspecific myositis as well.  Per neurology, abnormal SPEP results difficult to interpret in setting of chronic IVIG infusions (for chorioretinal inflammation) and recommend holding off on other serological antibody testing at this time. CK remains elevated, downtrending, will continue to trend.  Vit D low, which may play a role in myositis. Anti-jo negative. Cervical/thoracic MRI unremarkable. L femur MRI with diffuse edema of muscles consistent with nonspecific myositis, may be secondary to recent fall vs other inflammatory process given elevated ESR. Echo with EF 55, G1DD, mod-severe tricuspid regurg.  - Neurology following, appreciate recs - May consider L upper leg muscle biopsy if symptoms not improving with vitamin repletion  - Continue to trend CK  - Continue B12 injections, IV folate, IV thiamine , MVI, Vit D supplemenation - Follow up Vit A, E, K - PT/OT to treat - recommending CIR at this time - AM CBC, CMP  Nausea & vomiting No emesis documented in last 24 hr.  - Gastric emptying study today - Zofran  prn - AM CMP, replete electrolytes prn  - RD consulted Tachycardia Persistent today. No chest pain or  dyspnea though with new oxygen requirement overnight. Cervical/thoracic MRI with incidental lung findings c/f pneumonia.  Echo with EF 55, G1DD, mod-severe tricuspid regurg.  - CT Chest completed, pending read - CCM  Anemia Hg stable 8.3>8.5 this AM. Of note, other cell lines also down. Elevated ferritin, suspect inflammatory. Normal iron.  - Recheck CBC as needed  Urinary retention Patient s/p 2 I/O caths for retention. May be 2/2 to opiate use. Cervical/thoracic MRI unremarkable.  - Bladder scans q8h - Pure wick in place Transaminitis AST and ALT elevated on admission. AST/ALT ratio of ~3. GGT also elevated. Denies any alcohol use. RUQ US  with gallbladder sludge, otherwise unremarkable.  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 Per pharmacy, patient due for IVIG infusion on 4/19. Will order IVIG infusion per patient's home regimen.  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 (see MAR) Retinal edema: Continue acetazolamide  500 mg twice daily Gout: Continue allopurinol  100 mg daily, holding colchicine  as can contribute to peripheral neuropathy GERD: Continue famotidine  40 mg daily, pantoprazole  40 mg daily (substitute for Dexlansoprazole ) Asthma: Continue Dulera  2 puffs twice daily (substitute for Symbicort ), albuterol  nebulizer PRN, montelukast  10 mg daily Neuropathy: Gabapentin  600 mg TID, hold home duloxetine  given risk of myositis  HTN: continue irbesartan  150 + hydrochlorothiazide  12.5 daily    FEN/GI: NPO for gastric emptying study PPx: Lovenox  Dispo: CIR pending clinical improvement  Subjective:  Stomach hurting this morning, feels like needs to have BM. Last BM a couple days ago. No new vision changes or eye pain. No difficulty breathing or chest pain. Some cough in the mornings that is typical for patient. L left pain improving. Weakness improving.  Objective: Temp:  [98 F (36.7 C)-98.8 F (37.1 C)] 98  F (36.7 C) (04/22 0619) Pulse Rate:  [104-127] 118 (04/22 0746) Resp:  [18-20] 20 (04/22 0746) BP: (116-142)/(79-93) 129/85 (04/22 0619) SpO2:  [68 %-98 %] 94 % (04/22 0622) FiO2 (%):  [32 %] 32 % (04/22 0746) Weight:  [148 kg-149 kg] 149 kg (04/22 0300) Physical Exam: General: Well-appearing. Resting comfortably in room. CV: Normal S1/S2. No extra heart sounds. Warm and well-perfused. Pulm: Breathing comfortably on 3L O2. CTAB anteriorly. No increased WOB. Abd: Soft, non-distended. Tender throughout. No rebound or guarding.  Skin:  Warm, dry. Mild, improving tenderness to palpation of L upper leg.  Neuro: Alert and interactive. No vision changes. Stable anisocoria. Facial sensation intact BL. Regular speech. Tongue midline. Facial expression symmetric. 5/5 upper and lwoer extremity strength. Decreased sensation of lower extremities bilaterally.   Laboratory: Most recent CBC Lab Results  Component Value Date   WBC 4.5 07/18/2023   HGB 8.5 (L) 07/18/2023   HCT 27.0 (L) 07/18/2023   MCV 93.4 07/18/2023   PLT 202 07/18/2023   Most recent BMP    Latest Ref Rng & Units 07/18/2023    4:12 AM  BMP  Glucose 70 - 99 mg/dL 409   BUN 6 - 20 mg/dL 5   Creatinine 8.11 - 9.14 mg/dL 7.82   Sodium 956 - 213 mmol/L 135   Potassium 3.5 - 5.1 mmol/L 3.6   Chloride 98 - 111 mmol/L 101   CO2 22 - 32 mmol/L 27   Calcium 8.9 - 10.3 mg/dL 8.7     Carey Chapman, MD 07/18/2023, 7:48 AM  PGY-1, [facility] FPTS Intern pager: [phone], text pages welcome Secure chat group [facility] Teaching Service

## 2023-07-19 DIAGNOSIS — R634 Abnormal weight loss: Secondary | ICD-10-CM | POA: Diagnosis not present

## 2023-07-19 DIAGNOSIS — G63 Polyneuropathy in diseases classified elsewhere: Secondary | ICD-10-CM

## 2023-07-19 DIAGNOSIS — E509 Vitamin A deficiency, unspecified: Secondary | ICD-10-CM | POA: Diagnosis present

## 2023-07-19 DIAGNOSIS — J189 Pneumonia, unspecified organism: Secondary | ICD-10-CM

## 2023-07-19 DIAGNOSIS — E538 Deficiency of other specified B group vitamins: Secondary | ICD-10-CM | POA: Diagnosis not present

## 2023-07-19 HISTORY — DX: Pneumonia, unspecified organism: J18.9

## 2023-07-19 LAB — COMPREHENSIVE METABOLIC PANEL WITH GFR
ALT: 86 U/L — ABNORMAL HIGH (ref 0–44)
AST: 198 U/L — ABNORMAL HIGH (ref 15–41)
Albumin: 1.9 g/dL — ABNORMAL LOW (ref 3.5–5.0)
Alkaline Phosphatase: 67 U/L (ref 38–126)
Anion gap: 7 (ref 5–15)
BUN: 10 mg/dL (ref 6–20)
CO2: 23 mmol/L (ref 22–32)
Calcium: 8.9 mg/dL (ref 8.9–10.3)
Chloride: 102 mmol/L (ref 98–111)
Creatinine, Ser: 0.49 mg/dL (ref 0.44–1.00)
GFR, Estimated: >60 mL/min (ref >60)
Glucose, Bld: 105 mg/dL — ABNORMAL HIGH (ref 70–99)
Potassium: 3.9 mmol/L (ref 3.5–5.1)
Sodium: 132 mmol/L — ABNORMAL LOW (ref 135–145)
Total Bilirubin: 0.6 mg/dL (ref 0.0–1.2)
Total Protein: 8.8 g/dL — ABNORMAL HIGH (ref 6.5–8.1)

## 2023-07-19 LAB — CK: Total CK: 514 U/L — ABNORMAL HIGH (ref 38–234)

## 2023-07-19 LAB — METHYLMALONIC ACID, SERUM: Methylmalonic Acid, Quantitative: 139 nmol/L (ref 0–378)

## 2023-07-19 MED ORDER — DULOXETINE HCL 30 MG PO CPEP
30.0000 mg | ORAL_CAPSULE | Freq: Every day | ORAL | Status: DC
  Administered 2023-07-19 – 2023-07-20 (×2): 30 mg via ORAL
  Filled 2023-07-19 (×2): qty 1

## 2023-07-19 MED ORDER — ACETAMINOPHEN 500 MG PO TABS
1000.0000 mg | ORAL_TABLET | Freq: Once | ORAL | Status: AC
  Administered 2023-07-19: 1000 mg via ORAL
  Filled 2023-07-19: qty 2

## 2023-07-19 MED ORDER — VITAMIN A 3 MG (10000 UNIT) PO CAPS
10000.0000 [IU] | ORAL_CAPSULE | Freq: Every day | ORAL | Status: DC
  Administered 2023-07-19 – 2023-07-20 (×2): 10000 [IU] via ORAL
  Filled 2023-07-19 (×2): qty 1

## 2023-07-19 MED ORDER — OXYCODONE HCL 5 MG PO TABS
2.5000 mg | ORAL_TABLET | Freq: Once | ORAL | Status: AC
  Administered 2023-07-19: 2.5 mg via ORAL
  Filled 2023-07-19: qty 1

## 2023-07-19 MED ORDER — OXYCODONE HCL 5 MG PO TABS
2.5000 mg | ORAL_TABLET | Freq: Once | ORAL | Status: AC
  Administered 2023-07-20: 2.5 mg via ORAL
  Filled 2023-07-19: qty 1

## 2023-07-19 NOTE — Progress Notes (Signed)
 Physical Therapy Treatment Patient Details Name: Tiffany Velasquez MRN: [id number] DOB: May 05, 1995 Today's Date: 07/19/2023   History of Present Illness Pt is 28 y.o. female presenting 07/14/23 with 2 months of progressive weakness. Pt has fallen 6 times in the past 2 weeks. She was recently admitted 4/11 for generalized weakness as well; found to have significant nutritional deficiencies (including B12) in the setting of an aggressive weight loss program;  PMH asthma, glaucoma, panuveitis, HTN, anemia and peripheral neuropathy, obesity.    PT Comments  Pt seen for PT tx with pt received in bed with cover over eyes, lights off. Pt requires verbal cuing, lights turned on to engage with PT. Pt demonstrates very poor initiation throughout session, is labile re: situation, requires max cuing & encouragement throughout session. Pt requires mod assist with HOB elevated, bed rails to transition to sitting EOB. Pt completes STS from EOB with max assist +2, is able to transfer STS from stedy seat with supervision but unable to come to upright posture (unable to shift pelvis anteriorly). Pt would encouraged to sit in recliner at least 1 hour & pt agreeable. Will continue to follow pt acutely to progress mobility as able.    If plan is discharge home, recommend the following: Two people to help with walking and/or transfers;Two people to help with bathing/dressing/bathroom   Can travel by private vehicle        Equipment Recommendations  Rolling walker (2 wheels);Wheelchair (measurements PT);Wheelchair cushion (measurements PT);Other (comment);Hoyer lift;Hospital bed (drop arm BSC, bariatric equipment)    Recommendations for Other Services Rehab consult     Precautions / Restrictions Precautions Precautions: Fall Restrictions Weight Bearing Restrictions Per Provider Order: No     Mobility  Bed Mobility Overal bed mobility: Needs Assistance             General bed mobility comments: Pt  transitions from supine to moving BLE off EOB with min assist, then in sidelying position, requires mod assist to upright trunk with HOB fully elevated, bed rails.    Transfers Overall transfer level: Needs assistance Equipment used: Ambulation equipment used Transfers: Sit to/from Stand Sit to Stand: +2 physical assistance, Max assist, +2 safety/equipment, Via lift equipment           General transfer comment: STS with max assist from elevated EOB with cuing re: hand placement, counting "1-2-3" to initiate, cuing & encouragement to attempt; pt requires 2 attempts for successful STS from EOB. Pt transfers STS from stedy seat with supervision x 1 attempt, but clears buttocks & continues to lean chest on front bar, unable to shift pelvis anteriorly or upright trunk. Pt with no improvement with upright posture even when PT & mobility specialist provide +2 assist & cuing/demo.    Ambulation/Gait                   Stairs             Wheelchair Mobility     Tilt Bed    Modified Rankin (Stroke Patients Only)       Balance Overall balance assessment: Needs assistance Sitting-balance support: No upper extremity supported, Feet supported Sitting balance-Leahy Scale: Fair Sitting balance - Comments: close supervision static sitting EOB & in stedy seat   Standing balance support: Bilateral upper extremity supported, Reliant on assistive device for balance Standing balance-Leahy Scale: Zero Standing balance comment: BUE support on stedy  Communication Communication Communication: No apparent difficulties  Cognition Arousal: Alert Behavior During Therapy: Anxious, Lability   PT - Cognitive impairments: Initiation                       PT - Cognition Comments: Pt requires MAX encouragement to initiate each task, encouragement. Pt reports fear but not of falling, does not state what. Following commands:  Impaired Following commands impaired: Follows one step commands with increased time, Follows one step commands inconsistently    Cueing Cueing Techniques: Verbal cues, Tactile cues, Gestural cues, Visual cues  Exercises      General Comments        Pertinent Vitals/Pain Pain Assessment Pain Assessment: Faces Faces Pain Scale: Hurts even more Pain Location: (chronic) neuropathic pain in BLE feet Pain Descriptors / Indicators: Discomfort Pain Intervention(s): Monitored during session, Limited activity within patient's tolerance    Home Living                          Prior Function            PT Goals (current goals can now be found in the care plan section) Acute Rehab PT Goals Patient Stated Goal: to walk PT Goal Formulation: With patient Time For Goal Achievement: 07/30/23 Potential to Achieve Goals: Fair Progress towards PT goals: Progressing toward goals    Frequency    Min 2X/week      PT Plan      Co-evaluation              AM-PAC PT "6 Clicks" Mobility   Outcome Measure  Help needed turning from your back to your side while in a flat bed without using bedrails?: None Help needed moving from lying on your back to sitting on the side of a flat bed without using bedrails?: A Lot Help needed moving to and from a bed to a chair (including a wheelchair)?: Total Help needed standing up from a chair using your arms (e.g., wheelchair or bedside chair)?: Total Help needed to walk in hospital room?: Total Help needed climbing 3-5 steps with a railing? : Total 6 Click Score: 10    End of Session Equipment Utilized During Treatment: Gait belt Activity Tolerance: Patient tolerated treatment well Patient left: in chair;with call bell/phone within reach Nurse Communication: Mobility status PT Visit Diagnosis: Unsteadiness on feet (R26.81);Other abnormalities of gait and mobility (R26.89);Repeated falls (R29.6);Muscle weakness (generalized)  (M62.81);History of falling (Z91.81);Difficulty in walking, not elsewhere classified (R26.2);Other symptoms and signs involving the nervous system (R29.898)     Time: 4098-1191 PT Time Calculation (min) (ACUTE ONLY): 33 min  Charges:    $Therapeutic Activity: 23-37 mins PT General Charges $$ ACUTE PT VISIT: 1 Visit                     Emaline Handsome, PT, DPT 07/19/23, 12:15 PM   Tiffany Velasquez 07/19/2023, 12:13 PM

## 2023-07-19 NOTE — Assessment & Plan Note (Signed)
>>  ASSESSMENT AND PLAN FOR TRANSAMINITIS WRITTEN ON 07/19/2023  1:02 PM BY Carey Chapman, MD  AST and ALT elevated on admission, remains stablely elevated. AST/ALT ratio of ~3. GGT also elevated. Denies any alcohol use. RUQ US  with gallbladder sludge, otherwise unremarkable.

## 2023-07-19 NOTE — Assessment & Plan Note (Addendum)
 AST and ALT elevated on admission, remains stablely elevated. AST/ALT ratio of ~3. GGT also elevated. Denies any alcohol use. RUQ US  with gallbladder sludge, otherwise unremarkable.

## 2023-07-19 NOTE — Assessment & Plan Note (Addendum)
 No emesis since admission. Unable to complete gastric emptying study due to mobility issues. - Zofran  prn - Appreciate RD recs

## 2023-07-19 NOTE — Assessment & Plan Note (Addendum)
 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 Per pharmacy, patient due for IVIG infusion on 4/19. Will conitnue IVIG infusion per patient's home regimen.  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

## 2023-07-19 NOTE — Assessment & Plan Note (Signed)
>>  ASSESSMENT AND PLAN FOR GENERALIZED WEAKNESS  MYOSITIS WRITTEN ON 07/19/2023  1:02 PM BY DIONA PERKINS, MD  Stable to improving neuro exam. Overall LE weakness likely secondary to vitamin deficiency per Neuro, though also concern for nonspecific myositis as well.  Per neurology, abnormal SPEP results difficult to interpret in setting of chronic IVIG infusions and recommend holding off on other serological antibody testing at this time. CK downtrending.  Vit D and Vitamin A  low.  Vitamin E wnl. Anti-jo negative. Cervical/thoracic MRI unremarkable. L femur MRI with diffuse edema of muscles consistent with nonspecific myositis, may be secondary to recent fall vs other inflammatory process given elevated ESR.  - Neurology following, appreciate recs - Continue B12 injections, IV folate, IV thiamine , MVI, Vit D supplemenation - Begin vitamin A  repletion   - May consider L upper leg muscle biopsy if symptoms not improving with vitamin repletion  - Fu Vit K - Unfortunately unable to obtain inpatient Rheumatology consult. [facility] recommends outpatient referral as indicated.  - PT/OT to treat - recommending CIR at this time, will re-eval as indicated - AM CBC, CMP

## 2023-07-19 NOTE — Assessment & Plan Note (Addendum)
 Hg stable. Elevated ferritin, suspect inflammatory. Normal iron.  - Recheck CBC as needed

## 2023-07-19 NOTE — Plan of Care (Signed)
  Problem: Clinical Measurements: Goal: Will remain free from infection Outcome: Progressing Goal: Respiratory complications will improve Outcome: Progressing   Problem: Activity: Goal: Risk for activity intolerance will decrease Outcome: Progressing   

## 2023-07-19 NOTE — Assessment & Plan Note (Addendum)
 Intermittent tachycardia. CT chest concerning for multifocal pneumonia.  Echo with EF 55, G1DD, mod-severe tricuspid regurg. - Augmentin  twice daily (5-day course)

## 2023-07-19 NOTE — Progress Notes (Signed)
 Daily Progress Note Intern Pager: [phone]  Patient name: Tiffany Velasquez Medical record number: [id number] Date of birth: 07/31/1995 Age: 28 y.o. Gender: female  Primary Care Provider: Wilhemena Harbour, MD Consultants: Neurology Code Status: Full  Pt Overview and Major Events to Date:  4/18: Admitted  Assessment and Plan:  Tiffany Velasquez 28 y.o. 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  Neurology on board.  Pertinent PMH includes neuropathy, vitamin deficiencies, abnormal immunoglobulins, chorioretinal inflammation.  Assessment & Plan Generalized weakness  Myositis Stable to improving neuro exam. Overall LE weakness likely secondary to vitamin deficiency per Neuro, though also concern for nonspecific myositis as well.  Per neurology, abnormal SPEP results difficult to interpret in setting of chronic IVIG infusions and recommend holding off on other serological antibody testing at this time. CK downtrending.  Vit D and Vitamin A  low.  Vitamin E wnl. Anti-jo negative. Cervical/thoracic MRI unremarkable. L femur MRI with diffuse edema of muscles consistent with nonspecific myositis, may be secondary to recent fall vs other inflammatory process given elevated ESR.  - Neurology following, appreciate recs - Continue B12 injections, IV folate, IV thiamine , MVI, Vit D supplemenation - Begin vitamin A  repletion   - May consider L upper leg muscle biopsy if symptoms not improving with vitamin repletion  - Fu Vit K - Unfortunately unable to obtain inpatient Rheumatology consult. [facility] recommends outpatient referral as indicated.  - PT/OT to treat - recommending CIR at this time, will re-eval as indicated - AM CBC, CMP  Tachycardia  Multifocal PNA  Intermittent tachycardia. CT chest concerning for multifocal pneumonia.  Echo with  EF 55, G1DD, mod-severe tricuspid regurg. - Augmentin  twice daily (5-day course) Anemia Hg stable. Elevated ferritin, suspect inflammatory. Normal iron.  - Recheck CBC as needed  Urinary retention Patient s/p 2 I/O caths for retention. May be 2/2 to opiate use. Cervical/thoracic MRI unremarkable. Negative bladder scans following pure wick placement.Continue pure wick.  Transaminitis AST and ALT elevated on admission, remains stablely elevated. AST/ALT ratio of ~3. GGT also elevated. Denies any alcohol use. RUQ US  with gallbladder sludge, otherwise unremarkable.  Nausea & vomiting No emesis since admission. Unable to complete gastric emptying study due to mobility issues. 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 Per pharmacy, patient due for IVIG infusion on 4/19. Will conitnue IVIG infusion per patient's home regimen.  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 (see MAR) Retinal edema: Continue acetazolamide  500 mg twice daily Gout: Continue allopurinol  100 mg daily, holding colchicine  as can contribute to peripheral neuropathy GERD: Continue famotidine  40 mg daily, pantoprazole  40 mg daily (substitute for Dexlansoprazole ) Asthma: Continue Dulera  2 puffs twice daily (substitute for Symbicort ), albuterol  nebulizer PRN, montelukast  10 mg daily Neuropathy: Gabapentin  600 mg TID, Resume home Cymbalta  30 daily given improving myositis  HTN: continue irbesartan  150 + hydrochlorothiazide  12.5 daily   FEN/GI: Regular diet PPx: Lovenox  Dispo: Possibly CIR pending clinical improvement   Subjective:  Feet burning. Leg pain otherwise improving some. Reports standing and being able to use bedside commode yesterday with assistance. No chest pain or difficulty breathing. Baseline nighttime cough with asthma, no new cough. No new eye pain or vision change today. No vomiting since admission. Last BM yesterday,  improving abdominal pain and improving nausea.   Objective: Temp:  [98 F (36.7 C)-99 F (37.2  C)] 98.4 F (36.9 C) (04/23 0623) Pulse Rate:  [82-128] 82 (04/23 0623) Resp:  [18-20] 18 (04/23 0623) BP: (118-152)/(74-103) 118/76 (04/23 0623) SpO2:  [91 %-98 %] 98 % (04/23 0623) FiO2 (%):  [32 %] 32 % (04/22 0746) Physical Exam: General: No acute distress.  CV: Normal S1/S2. No extra heart sounds. Warm and well-perfused. Pulm: Breathing comfortably on room air. CTAB anteriorly. No increased WOB. Abd: Soft, non-distended. Mildly tender to palpation throughout, no rebound or guarding.  Skin/Ext:  Warm, dry.  Improving, mild tenderness to palpation of left upper leg.  Tender to palpation of bilateral feet. Neuro: Alert and interactive.  No reported vision changes.  Stable anisocoria.  Facial sensation intact bilaterally.  Tongue midline.  Regular speech.  Facial expression symmetric.  Turns head bilaterally against resistance.  5/5 upper and lower extremity strength.    Laboratory: Most recent CBC Lab Results  Component Value Date   WBC 4.5 07/18/2023   HGB 8.5 (L) 07/18/2023   HCT 27.0 (L) 07/18/2023   MCV 93.4 07/18/2023   PLT 202 07/18/2023   Most recent BMP    Latest Ref Rng & Units 07/19/2023    1:31 AM  BMP  Glucose 70 - 99 mg/dL 960   BUN 6 - 20 mg/dL 10   Creatinine 4.54 - 1.00 mg/dL 0.98   Sodium 119 - 147 mmol/L 132   Potassium 3.5 - 5.1 mmol/L 3.9   Chloride 98 - 111 mmol/L 102   CO2 22 - 32 mmol/L 23   Calcium 8.9 - 10.3 mg/dL 8.9    CK: 829 CT Chest: multifocal airspace and patchy ground glass opacities throughout both lungs compatible for multifocal pna   Carey Chapman, MD 07/19/2023, 7:17 AM  PGY-1, [facility] FPTS Intern pager: [phone], text pages welcome Secure chat group [facility] Teaching Service

## 2023-07-19 NOTE — Assessment & Plan Note (Addendum)
 Patient s/p 2 I/O caths for retention. May be 2/2 to opiate use. Cervical/thoracic MRI unremarkable. Negative bladder scans following pure wick placement.Continue pure wick.

## 2023-07-19 NOTE — Progress Notes (Signed)
 Inpatient Rehabilitation Admissions Coordinator   I have begun insurance approval with [facility] for possible CIR admit.  Jeannetta Millman, RN, MSN Rehab Admissions Coordinator [phone] 07/19/2023 1:46 PM

## 2023-07-19 NOTE — Assessment & Plan Note (Addendum)
 Stable to improving neuro exam. Overall LE weakness likely secondary to vitamin deficiency per Neuro, though also concern for nonspecific myositis as well.  Per neurology, abnormal SPEP results difficult to interpret in setting of chronic IVIG infusions and recommend holding off on other serological antibody testing at this time. CK downtrending.  Vit D and Vitamin A  low.  Vitamin E wnl. Anti-jo negative. Cervical/thoracic MRI unremarkable. L femur MRI with diffuse edema of muscles consistent with nonspecific myositis, may be secondary to recent fall vs other inflammatory process given elevated ESR.  - Neurology following, appreciate recs - Continue B12 injections, IV folate, IV thiamine , MVI, Vit D supplemenation - Begin vitamin A  repletion   - May consider L upper leg muscle biopsy if symptoms not improving with vitamin repletion  - Fu Vit K - Unfortunately unable to obtain inpatient Rheumatology consult. [facility] recommends outpatient referral as indicated.  - PT/OT to treat - recommending CIR at this time, will re-eval as indicated - AM CBC, CMP

## 2023-07-20 ENCOUNTER — Other Ambulatory Visit: Payer: Self-pay

## 2023-07-20 ENCOUNTER — Encounter (HOSPITAL_COMMUNITY): Payer: Self-pay | Admitting: Physical Medicine and Rehabilitation

## 2023-07-20 ENCOUNTER — Inpatient Hospital Stay (HOSPITAL_COMMUNITY)
Admission: AD | Admit: 2023-07-20 | Discharge: 2023-08-16 | DRG: 555 | Disposition: A | Discharge status: Home / Self Care | Source: Intra-hospital | Attending: Physical Medicine and Rehabilitation | Admitting: Physical Medicine and Rehabilitation

## 2023-07-20 ENCOUNTER — Encounter (HOSPITAL_COMMUNITY): Payer: Self-pay | Admitting: Family Medicine

## 2023-07-20 DIAGNOSIS — K909 Intestinal malabsorption, unspecified: Secondary | ICD-10-CM | POA: Diagnosis present

## 2023-07-20 DIAGNOSIS — R339 Retention of urine, unspecified: Secondary | ICD-10-CM | POA: Diagnosis not present

## 2023-07-20 DIAGNOSIS — I110 Hypertensive heart disease with heart failure: Secondary | ICD-10-CM | POA: Diagnosis present

## 2023-07-20 DIAGNOSIS — N390 Urinary tract infection, site not specified: Secondary | ICD-10-CM | POA: Diagnosis not present

## 2023-07-20 DIAGNOSIS — R760 Raised antibody titer: Secondary | ICD-10-CM | POA: Diagnosis present

## 2023-07-20 DIAGNOSIS — M792 Neuralgia and neuritis, unspecified: Secondary | ICD-10-CM | POA: Diagnosis not present

## 2023-07-20 DIAGNOSIS — J690 Pneumonitis due to inhalation of food and vomit: Secondary | ICD-10-CM | POA: Diagnosis not present

## 2023-07-20 DIAGNOSIS — F419 Anxiety disorder, unspecified: Secondary | ICD-10-CM | POA: Diagnosis not present

## 2023-07-20 DIAGNOSIS — M109 Gout, unspecified: Secondary | ICD-10-CM | POA: Diagnosis present

## 2023-07-20 DIAGNOSIS — G4700 Insomnia, unspecified: Secondary | ICD-10-CM | POA: Diagnosis not present

## 2023-07-20 DIAGNOSIS — E876 Hypokalemia: Secondary | ICD-10-CM | POA: Diagnosis not present

## 2023-07-20 DIAGNOSIS — Z6841 Body Mass Index (BMI) 40.0 and over, adult: Secondary | ICD-10-CM

## 2023-07-20 DIAGNOSIS — G8929 Other chronic pain: Secondary | ICD-10-CM | POA: Diagnosis present

## 2023-07-20 DIAGNOSIS — E509 Vitamin A deficiency, unspecified: Secondary | ICD-10-CM | POA: Diagnosis present

## 2023-07-20 DIAGNOSIS — I951 Orthostatic hypotension: Secondary | ICD-10-CM | POA: Diagnosis present

## 2023-07-20 DIAGNOSIS — R5381 Other malaise: Secondary | ICD-10-CM | POA: Diagnosis present

## 2023-07-20 DIAGNOSIS — I071 Rheumatic tricuspid insufficiency: Secondary | ICD-10-CM | POA: Diagnosis present

## 2023-07-20 DIAGNOSIS — R519 Headache, unspecified: Secondary | ICD-10-CM | POA: Diagnosis not present

## 2023-07-20 DIAGNOSIS — K5901 Slow transit constipation: Secondary | ICD-10-CM | POA: Diagnosis not present

## 2023-07-20 DIAGNOSIS — L309 Dermatitis, unspecified: Secondary | ICD-10-CM | POA: Diagnosis present

## 2023-07-20 DIAGNOSIS — E569 Vitamin deficiency, unspecified: Secondary | ICD-10-CM | POA: Diagnosis not present

## 2023-07-20 DIAGNOSIS — D638 Anemia in other chronic diseases classified elsewhere: Secondary | ICD-10-CM | POA: Diagnosis present

## 2023-07-20 DIAGNOSIS — Z79899 Other long term (current) drug therapy: Secondary | ICD-10-CM

## 2023-07-20 DIAGNOSIS — R748 Abnormal levels of other serum enzymes: Secondary | ICD-10-CM | POA: Diagnosis present

## 2023-07-20 DIAGNOSIS — F4323 Adjustment disorder with mixed anxiety and depressed mood: Secondary | ICD-10-CM

## 2023-07-20 DIAGNOSIS — R195 Other fecal abnormalities: Secondary | ICD-10-CM | POA: Diagnosis not present

## 2023-07-20 DIAGNOSIS — Q998 Other specified chromosome abnormalities: Secondary | ICD-10-CM | POA: Diagnosis not present

## 2023-07-20 DIAGNOSIS — H30033 Focal chorioretinal inflammation, peripheral, bilateral: Secondary | ICD-10-CM | POA: Diagnosis present

## 2023-07-20 DIAGNOSIS — J45909 Unspecified asthma, uncomplicated: Secondary | ICD-10-CM | POA: Diagnosis present

## 2023-07-20 DIAGNOSIS — D84821 Immunodeficiency due to drugs: Secondary | ICD-10-CM | POA: Diagnosis present

## 2023-07-20 DIAGNOSIS — E519 Thiamine deficiency, unspecified: Secondary | ICD-10-CM | POA: Diagnosis present

## 2023-07-20 DIAGNOSIS — I5032 Chronic diastolic (congestive) heart failure: Secondary | ICD-10-CM | POA: Diagnosis not present

## 2023-07-20 DIAGNOSIS — G63 Polyneuropathy in diseases classified elsewhere: Secondary | ICD-10-CM | POA: Diagnosis present

## 2023-07-20 DIAGNOSIS — M6089 Other myositis, multiple sites: Secondary | ICD-10-CM | POA: Diagnosis present

## 2023-07-20 DIAGNOSIS — R140 Abdominal distension (gaseous): Secondary | ICD-10-CM | POA: Diagnosis not present

## 2023-07-20 DIAGNOSIS — E538 Deficiency of other specified B group vitamins: Secondary | ICD-10-CM | POA: Diagnosis present

## 2023-07-20 DIAGNOSIS — R7982 Elevated C-reactive protein (CRP): Secondary | ICD-10-CM | POA: Diagnosis present

## 2023-07-20 DIAGNOSIS — R7401 Elevation of levels of liver transaminase levels: Secondary | ICD-10-CM | POA: Diagnosis present

## 2023-07-20 DIAGNOSIS — E871 Hypo-osmolality and hyponatremia: Secondary | ICD-10-CM | POA: Diagnosis present

## 2023-07-20 DIAGNOSIS — R000 Tachycardia, unspecified: Secondary | ICD-10-CM | POA: Diagnosis present

## 2023-07-20 DIAGNOSIS — B964 Proteus (mirabilis) (morganii) as the cause of diseases classified elsewhere: Secondary | ICD-10-CM | POA: Diagnosis not present

## 2023-07-20 DIAGNOSIS — Z8701 Personal history of pneumonia (recurrent): Secondary | ICD-10-CM

## 2023-07-20 DIAGNOSIS — M609 Myositis, unspecified: Secondary | ICD-10-CM | POA: Diagnosis present

## 2023-07-20 DIAGNOSIS — H4043X0 Glaucoma secondary to eye inflammation, bilateral, stage unspecified: Secondary | ICD-10-CM | POA: Diagnosis present

## 2023-07-20 DIAGNOSIS — G629 Polyneuropathy, unspecified: Secondary | ICD-10-CM | POA: Diagnosis not present

## 2023-07-20 DIAGNOSIS — K5900 Constipation, unspecified: Secondary | ICD-10-CM | POA: Diagnosis not present

## 2023-07-20 DIAGNOSIS — Z825 Family history of asthma and other chronic lower respiratory diseases: Secondary | ICD-10-CM

## 2023-07-20 DIAGNOSIS — H44113 Panuveitis, bilateral: Secondary | ICD-10-CM | POA: Diagnosis present

## 2023-07-20 DIAGNOSIS — M25562 Pain in left knee: Secondary | ICD-10-CM | POA: Diagnosis not present

## 2023-07-20 DIAGNOSIS — F39 Unspecified mood [affective] disorder: Secondary | ICD-10-CM | POA: Diagnosis not present

## 2023-07-20 DIAGNOSIS — M549 Dorsalgia, unspecified: Secondary | ICD-10-CM | POA: Diagnosis present

## 2023-07-20 DIAGNOSIS — R300 Dysuria: Secondary | ICD-10-CM | POA: Diagnosis not present

## 2023-07-20 DIAGNOSIS — I10 Essential (primary) hypertension: Secondary | ICD-10-CM | POA: Diagnosis not present

## 2023-07-20 DIAGNOSIS — E5111 Dry beriberi: Secondary | ICD-10-CM | POA: Diagnosis not present

## 2023-07-20 DIAGNOSIS — M6088 Other myositis, other site: Principal | ICD-10-CM | POA: Diagnosis present

## 2023-07-20 DIAGNOSIS — E559 Vitamin D deficiency, unspecified: Secondary | ICD-10-CM | POA: Diagnosis present

## 2023-07-20 DIAGNOSIS — Z7951 Long term (current) use of inhaled steroids: Secondary | ICD-10-CM

## 2023-07-20 DIAGNOSIS — R32 Unspecified urinary incontinence: Secondary | ICD-10-CM | POA: Diagnosis not present

## 2023-07-20 DIAGNOSIS — D62 Acute posthemorrhagic anemia: Secondary | ICD-10-CM | POA: Diagnosis not present

## 2023-07-20 DIAGNOSIS — H3581 Retinal edema: Secondary | ICD-10-CM | POA: Diagnosis present

## 2023-07-20 DIAGNOSIS — Z79620 Long term (current) use of immunosuppressive biologic: Secondary | ICD-10-CM

## 2023-07-20 DIAGNOSIS — K2950 Unspecified chronic gastritis without bleeding: Secondary | ICD-10-CM | POA: Diagnosis present

## 2023-07-20 DIAGNOSIS — R197 Diarrhea, unspecified: Secondary | ICD-10-CM | POA: Diagnosis not present

## 2023-07-20 DIAGNOSIS — T394X5A Adverse effect of antirheumatics, not elsewhere classified, initial encounter: Secondary | ICD-10-CM | POA: Diagnosis present

## 2023-07-20 DIAGNOSIS — Z79624 Long term (current) use of inhibitors of nucleotide synthesis: Secondary | ICD-10-CM

## 2023-07-20 LAB — VITAMIN B1: Vitamin B1 (Thiamine): 49.9 nmol/L — ABNORMAL LOW (ref 66.5–200.0)

## 2023-07-20 MED ORDER — FOLIC ACID 5 MG/ML IJ SOLN
1.0000 mg | Freq: Every day | INTRAMUSCULAR | Status: DC
  Administered 2023-07-21 – 2023-07-23 (×3): 1 mg via INTRAVENOUS
  Filled 2023-07-20 (×4): qty 0.2

## 2023-07-20 MED ORDER — ALBUTEROL SULFATE (2.5 MG/3ML) 0.083% IN NEBU: 2.5000 mg | INHALATION_SOLUTION | Freq: Four times a day (QID) | RESPIRATORY_TRACT | Status: DC | PRN

## 2023-07-20 MED ORDER — ACETAZOLAMIDE 250 MG PO TABS
500.0000 mg | ORAL_TABLET | Freq: Two times a day (BID) | ORAL | Status: DC
  Administered 2023-07-21 – 2023-08-16 (×53): 500 mg via ORAL
  Filled 2023-07-20 (×53): qty 2

## 2023-07-20 MED ORDER — ADULT MULTIVITAMIN W/MINERALS CH
1.0000 | ORAL_TABLET | Freq: Every day | ORAL | Status: DC
  Administered 2023-07-21 – 2023-08-16 (×27): 1 via ORAL
  Filled 2023-07-20 (×27): qty 1

## 2023-07-20 MED ORDER — CYANOCOBALAMIN 1000 MCG/ML IJ SOLN: 1000.0000 ug | Freq: Every day | INTRAMUSCULAR | 0 refills | Status: DC

## 2023-07-20 MED ORDER — VITAMIN B-12 1000 MCG PO TABS
1000.0000 ug | ORAL_TABLET | Freq: Every day | ORAL | Status: DC
  Administered 2023-07-22 – 2023-08-16 (×26): 1000 ug via ORAL
  Filled 2023-07-20 (×26): qty 1

## 2023-07-20 MED ORDER — ADULT MULTIVITAMIN W/MINERALS CH: 1.0000 | ORAL_TABLET | Freq: Every day | ORAL | Status: AC

## 2023-07-20 MED ORDER — LIDOCAINE 4 % EX CREA
TOPICAL_CREAM | Freq: Three times a day (TID) | CUTANEOUS | Status: DC
  Administered 2023-07-21 – 2023-08-13 (×48): 1 via TOPICAL
  Filled 2023-07-20 (×6): qty 5
  Filled 2023-07-20: qty 15
  Filled 2023-07-20 (×2): qty 5

## 2023-07-20 MED ORDER — VITAMIN D (ERGOCALCIFEROL) 1.25 MG (50000 UNIT) PO CAPS
50000.0000 [IU] | ORAL_CAPSULE | ORAL | Status: DC
  Administered 2023-07-22 – 2023-08-12 (×4): 50000 [IU] via ORAL
  Filled 2023-07-20 (×4): qty 1

## 2023-07-20 MED ORDER — ALLOPURINOL 100 MG PO TABS
100.0000 mg | ORAL_TABLET | Freq: Every day | ORAL | Status: DC
  Administered 2023-07-21 – 2023-08-16 (×27): 100 mg via ORAL
  Filled 2023-07-20 (×27): qty 1

## 2023-07-20 MED ORDER — LIDOCAINE HCL URETHRAL/MUCOSAL 2 % EX GEL: CUTANEOUS | Status: DC | PRN

## 2023-07-20 MED ORDER — CYANOCOBALAMIN 1000 MCG PO TABS: 1000.0000 ug | ORAL_TABLET | Freq: Every day | ORAL | Status: DC

## 2023-07-20 MED ORDER — FAMOTIDINE 20 MG PO TABS
40.0000 mg | ORAL_TABLET | Freq: Every day | ORAL | Status: DC
  Administered 2023-07-20 – 2023-08-15 (×27): 40 mg via ORAL
  Filled 2023-07-20 (×27): qty 2

## 2023-07-20 MED ORDER — BRIMONIDINE TARTRATE 0.2 % OP SOLN
1.0000 [drp] | Freq: Three times a day (TID) | OPHTHALMIC | Status: DC
  Administered 2023-07-21 – 2023-08-16 (×78): 1 [drp] via OPHTHALMIC
  Filled 2023-07-20: qty 5

## 2023-07-20 MED ORDER — VITAMIN D (ERGOCALCIFEROL) 1.25 MG (50000 UNIT) PO CAPS: 50000.0000 [IU] | ORAL_CAPSULE | ORAL | Status: DC

## 2023-07-20 MED ORDER — FOLIC ACID 1 MG PO TABS
1.0000 mg | ORAL_TABLET | Freq: Every day | ORAL | Status: DC
Start: 2023-07-20 — End: 2023-07-25

## 2023-07-20 MED ORDER — POLYETHYLENE GLYCOL 3350 17 G PO PACK
17.0000 g | PACK | Freq: Every day | ORAL | Status: DC
  Administered 2023-07-21 – 2023-07-23 (×3): 17 g via ORAL
  Filled 2023-07-20 (×3): qty 1

## 2023-07-20 MED ORDER — AMOXICILLIN-POT CLAVULANATE 875-125 MG PO TABS: 1.0000 | ORAL_TABLET | Freq: Two times a day (BID) | ORAL | Status: DC

## 2023-07-20 MED ORDER — HYDROCHLOROTHIAZIDE 12.5 MG PO TABS
12.5000 mg | ORAL_TABLET | Freq: Every day | ORAL | Status: DC
  Administered 2023-07-21 – 2023-07-24 (×4): 12.5 mg via ORAL
  Filled 2023-07-20 (×4): qty 1

## 2023-07-20 MED ORDER — MOMETASONE FURO-FORMOTEROL FUM 200-5 MCG/ACT IN AERO
2.0000 | INHALATION_SPRAY | Freq: Two times a day (BID) | RESPIRATORY_TRACT | Status: DC
  Administered 2023-07-20 – 2023-08-14 (×48): 2 via RESPIRATORY_TRACT
  Filled 2023-07-20: qty 8.8

## 2023-07-20 MED ORDER — VITAMIN A 3 MG (10000 UNIT) PO CAPS
10000.0000 [IU] | ORAL_CAPSULE | Freq: Every day | ORAL | Status: DC
  Administered 2023-07-21 – 2023-08-16 (×27): 10000 [IU] via ORAL
  Filled 2023-07-20 (×27): qty 1

## 2023-07-20 MED ORDER — ENSURE MAX PROTEIN PO LIQD: 11.0000 [oz_av] | Freq: Two times a day (BID) | ORAL | Status: AC

## 2023-07-20 MED ORDER — LATANOPROST 0.005 % OP SOLN
1.0000 [drp] | Freq: Every day | OPHTHALMIC | Status: DC
  Administered 2023-07-20 – 2023-08-15 (×27): 1 [drp] via OPHTHALMIC

## 2023-07-20 MED ORDER — SUCRALFATE 1 GM/10ML PO SUSP
1.0000 g | Freq: Two times a day (BID) | ORAL | Status: DC
  Administered 2023-07-21 – 2023-08-16 (×53): 1 g via ORAL
  Filled 2023-07-20 (×53): qty 10

## 2023-07-20 MED ORDER — MONTELUKAST SODIUM 10 MG PO TABS
10.0000 mg | ORAL_TABLET | Freq: Every day | ORAL | Status: DC
  Administered 2023-07-20 – 2023-08-15 (×27): 10 mg via ORAL
  Filled 2023-07-20 (×27): qty 1

## 2023-07-20 MED ORDER — GABAPENTIN 300 MG PO CAPS
600.0000 mg | ORAL_CAPSULE | Freq: Three times a day (TID) | ORAL | Status: DC
  Administered 2023-07-20 – 2023-07-21 (×2): 600 mg via ORAL
  Filled 2023-07-20 (×2): qty 2

## 2023-07-20 MED ORDER — CYANOCOBALAMIN 1000 MCG/ML IJ SOLN
1000.0000 ug | Freq: Every day | INTRAMUSCULAR | Status: AC
  Administered 2023-07-21: 1000 ug via INTRAMUSCULAR
  Filled 2023-07-20: qty 1

## 2023-07-20 MED ORDER — THIAMINE HCL 100 MG/ML IJ SOLN
250.0000 mg | Freq: Every day | INTRAVENOUS | Status: AC
  Administered 2023-07-21 – 2023-07-23 (×3): 250 mg via INTRAVENOUS
  Filled 2023-07-20 (×3): qty 2.5

## 2023-07-20 MED ORDER — DORZOLAMIDE HCL-TIMOLOL MAL 2-0.5 % OP SOLN
1.0000 [drp] | Freq: Two times a day (BID) | OPHTHALMIC | Status: DC
  Administered 2023-07-20 – 2023-08-16 (×54): 1 [drp] via OPHTHALMIC

## 2023-07-20 MED ORDER — THIAMINE HCL 100 MG/ML IJ SOLN
100.0000 mg | Freq: Every day | INTRAMUSCULAR | Status: DC
  Administered 2023-07-24 – 2023-08-10 (×18): 100 mg via INTRAVENOUS
  Filled 2023-07-20 (×20): qty 1

## 2023-07-20 MED ORDER — ENSURE MAX PROTEIN PO LIQD
11.0000 [oz_av] | Freq: Two times a day (BID) | ORAL | Status: DC
  Administered 2023-07-20 – 2023-08-16 (×29): 11 [oz_av] via ORAL

## 2023-07-20 MED ORDER — AZELASTINE HCL 0.1 % NA SOLN
2.0000 | Freq: Two times a day (BID) | NASAL | Status: DC
  Administered 2023-07-20 – 2023-08-16 (×50): 2 via NASAL
  Filled 2023-07-20: qty 30

## 2023-07-20 MED ORDER — AMOXICILLIN-POT CLAVULANATE 875-125 MG PO TABS
1.0000 | ORAL_TABLET | Freq: Two times a day (BID) | ORAL | Status: AC
Start: 2023-07-20 — End: 2023-07-23
  Administered 2023-07-20 – 2023-07-23 (×6): 1 via ORAL
  Filled 2023-07-20 (×6): qty 1

## 2023-07-20 MED ORDER — PANTOPRAZOLE SODIUM 40 MG PO TBEC
40.0000 mg | DELAYED_RELEASE_TABLET | Freq: Every day | ORAL | Status: DC
  Administered 2023-07-21 – 2023-08-16 (×27): 40 mg via ORAL
  Filled 2023-07-20 (×27): qty 1

## 2023-07-20 MED ORDER — THIAMINE HCL 100 MG/ML IJ SOLN: 100.0000 mg | Freq: Every day | INTRAMUSCULAR | Status: DC

## 2023-07-20 MED ORDER — IRBESARTAN 75 MG PO TABS
150.0000 mg | ORAL_TABLET | Freq: Every day | ORAL | Status: DC
  Administered 2023-07-21 – 2023-07-24 (×4): 150 mg via ORAL
  Filled 2023-07-20 (×4): qty 2

## 2023-07-20 MED ORDER — DULOXETINE HCL 30 MG PO CPEP
30.0000 mg | ORAL_CAPSULE | Freq: Every day | ORAL | Status: DC
  Administered 2023-07-21 – 2023-07-25 (×5): 30 mg via ORAL
  Filled 2023-07-20 (×5): qty 1

## 2023-07-20 MED ORDER — NON FORMULARY: 200.0000 ug | Freq: Two times a day (BID) | Status: DC

## 2023-07-20 MED ORDER — VITAMIN A 3 MG (10000 UNIT) PO CAPS: 10000.0000 [IU] | ORAL_CAPSULE | Freq: Every day | ORAL | Status: DC

## 2023-07-20 NOTE — Assessment & Plan Note (Signed)
>>  ASSESSMENT AND PLAN FOR GENERALIZED WEAKNESS  MYOSITIS WRITTEN ON 07/20/2023 11:51 AM BY DIONA PERKINS, MD  Stable to improving neuro exam, with reported improvement of symptoms. Overall LE weakness concerning for known vitamin deficiency (A, D, B1, B9, B12) vs component of autoimmune inflammatory etiology. L femur MRI with signs of myositis, suspect secondary to recent fall. Reassuringly, CK downtrended and patient improving symptomatically. Per neurology, abnormal SPEP results difficult to interpret in setting of chronic IVIG infusions. Vitamin E wnl. Anti-jo negative.  - Neurology following, appreciate recs - Continue B12 injections, IV folate, IV thiamine , MVI, Vit D supplemenation - Continue vitamin A  repletion   - Vit K lab in process - Ald/Renin in process - Anti-scleroderma and centromere antibodies in process - Strep pneumo and legionella urine antigen pending  - Unfortunately unable to obtain inpatient Rheumatology consult. [facility] recommends outpatient referral as indicated.  - PT/OT to treat - recommending CIR at this time, will re-eval as indicated

## 2023-07-20 NOTE — H&P (Signed)
 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 Per reports falls X 6 in 2 weeks  with lack of feeling in legs and occasional dizziness when standing. She denied  She was sent to ED for evaluation. She was found to have low folate, low vitamin D  and Vitamin A , elevated CRP, low BI <6. She was started on IV thiamine  qid,Vitamin A  IM, IV  folate, B12 injections and Vitamin D  supplement.MRI brain and spine without acute abnormality.  CT chest done due to intermittent tachycardia and she was also found to have multifocal PNA treated with 5 day course of Augmentin . 2 D echo showed EF 55% with moderate to severe TVR and G1DD.     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 Discontinuation of duloxetine  (as can contribute to rhabdo)and muscle biopsy recommended initially by Dr.Stack but patient has had improvement with aggressive vitamin supplementation symptoms felt to be due to nutritional etiology though infectious or inflammatory etiology questioned. Lidocaine  gel added for chronic foot pain and colchicine  d/c.    Pt reports severe painful peripheral neuropathy in her feet and generalized LE>UE weakness.  LBM yesterday Been using Purewick due to inability/difficulty getting OOB.  Hurting in back and legs- aching and meds are helpful.      Review of  Systems  Constitutional:  Negative for chills and fever.  HENT:  Negative for hearing loss and tinnitus.   Eyes:  Negative for blurred vision and double vision.  Respiratory:  Negative for cough and shortness of breath.   Cardiovascular:  Negative for chest pain and palpitations.  Gastrointestinal:  Negative for abdominal pain, heartburn and nausea.  Genitourinary:  Negative for dysuria and urgency.  Musculoskeletal:  Positive for back pain and myalgias (left thigh).  Neurological:  Positive for sensory change and weakness. Negative for dizziness and headaches.  Psychiatric/Behavioral:  The patient is nervous/anxious. The patient does not have insomnia.   All other systems reviewed and are negative.           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               Past Surgical History:  Procedure Laterality Date   BIOPSY   04/04/2023    Procedure: BIOPSY;  Surgeon: Junie Olds, MD;  Location: [facility];  Service: General;;   CATARACT EXTRACTION       ESOPHAGOGASTRODUODENOSCOPY N/A 04/04/2023    Procedure: ESOPHAGOGASTRODUODENOSCOPY (EGD);  Surgeon: Junie Olds, MD;  Location: [facility];  Service: General;  Laterality: N/A;   PORTA CATH INSERTION  Family History  Problem Relation Age of Onset   Asthma Mother     Allergic rhinitis Father     Sudden death Cousin     Eczema Neg Hx     Urticaria Neg Hx            Social History:  Single. Lives with family. She  reports that she has never smoked. She has never used smokeless tobacco. She reports current alcohol use. She reports that she does not use drugs.     Allergies       Allergies  Allergen Reactions   Other Itching, Rash and Swelling      Seafood,tomato paste, peanut  butter, peaches, oranges, apples  Throat swelling  Dust mite, oak trees, grass- causes rash, itching   Peanut -Containing Drug Products Anaphylaxis   Shellfish Allergy  Anaphylaxis      Throat swelling     Apple Juice Rash   Orange Fruit [Citrus] Rash   Peach Flavoring Agent (Non-Screening) Rash   Tomato Rash, Hives and Itching   Tree Extract Rash              Medications Prior to Admission  Medication Sig Dispense Refill   acetaminophen  (TYLENOL ) 500 MG tablet Take 2 tablets (1,000 mg total) by mouth every 6 (six) hours as needed for mild pain (pain score 1-3) or moderate pain (pain score 4-6). (Patient taking differently: Take 500-1,000 mg by mouth every 6 (six) hours as needed for mild pain (pain score 1-3) or moderate pain (pain score 4-6).)       acetaZOLAMIDE  (DIAMOX ) 250 MG tablet Take 500 mg by mouth 2 (two) times daily.       albuterol  (PROVENTIL ) (2.5 MG/3ML) 0.083% nebulizer solution Take 3 mLs (2.5 mg total) by nebulization every 6 (six) hours as needed for wheezing or shortness of breath. (Patient taking differently: Take 2.5 mg by nebulization every 4 (four) hours as needed for wheezing or shortness of breath.) 75 mL 1   albuterol  (VENTOLIN  HFA) 108 (90 Base) MCG/ACT inhaler Inhale 2 puffs every 4-6 hours as needed for cough, wheeze, tightness in chest, or shortness of breath 8 g 1   allopurinol  (ZYLOPRIM ) 100 MG tablet Take 1 tablet (100 mg total) by mouth daily. 30 tablet 2   azelastine  (ASTELIN ) 0.1 % nasal spray USE 2 SPRAYS IN EACH NOSTRIL TWICE DAILY AS NEEDED FOR RUNNY NOSE/DRAINAGE DOWN THROAT (Patient taking differently: 2 sprays 2 (two) times daily. USE 2 SPRAYS IN EACH NOSTRIL TWICE DAILY AS NEEDED FOR RUNNY NOSE/DRAINAGE DOWN THROAT) 30 mL 5   bimatoprost (LUMIGAN) 0.01 % SOLN Place 1 drop into both eyes at bedtime.       brimonidine  (ALPHAGAN ) 0.2 % ophthalmic solution Place 1 drop into both eyes 3 (three) times daily.       budesonide -formoterol  (SYMBICORT ) 160-4.5 MCG/ACT inhaler Inhale 2 puffs into  the lungs in the morning and at bedtime. (Patient taking differently: Inhale 2 puffs into the lungs at bedtime.) 1 each 5   colchicine  0.6 MG tablet Take 1 tablet (0.6 mg total) by mouth daily. 30 tablet 2   dexlansoprazole  (DEXILANT ) 60 MG capsule Take 1 capsule (60 mg total) by mouth daily. (Patient taking differently: Take 60 mg by mouth at bedtime.) 90 capsule 3   dorzolamide -timolol  (COSOPT ) 2-0.5 % ophthalmic solution Place 1 drop into both eyes 2 (two) times daily.       DULoxetine  (CYMBALTA ) 30 MG capsule Take 1 capsule (30 mg total) by mouth daily. (Patient taking  differently: Take 30 mg by mouth at bedtime.) 30 capsule 3   famotidine  (PEPCID ) 40 MG tablet Take 1 tablet (40 mg total) by mouth at bedtime. 90 tablet 3   fluticasone  (FLONASE ) 50 MCG/ACT nasal spray Place 1 spray into both nostrils daily. 16 each 3   gabapentin  (NEURONTIN ) 300 MG capsule Start at 300 p.o. 3 times daily.  If tolerated work up to 600 mg p.o. 3 times daily (Patient taking differently: Take 600 mg by mouth 3 (three) times daily.) 90 capsule 3   GAMUNEX-C 20 GM/200ML SOLN         Heparin  Na, Pork, Lock Flsh PF (BD HEPARIN  POSIFLUSH) 100 UNIT/ML SOLN Inject 100 Units into the vein daily as needed (Given by nurse at home).       loratadine  (CLARITIN ) 10 MG tablet Take 1 tablet (10 mg total) by mouth daily. 30 tablet 11   metoCLOPramide  (REGLAN ) 10 MG tablet Take 1 tablet (10 mg total) by mouth every 8 (eight) hours as needed for nausea. 30 tablet 0   montelukast  (SINGULAIR ) 10 MG tablet Take 1 tablet (10 mg total) by mouth at bedtime. 90 tablet 1   mycophenolate (CELLCEPT) 500 MG tablet Take 500 mg by mouth 2 (two) times daily.       Olmesartan -amLODIPine -HCTZ 20-5-12.5 MG TABS Take 1 tablet by mouth daily. 30 tablet 3   ondansetron  (ZOFRAN ) 8 MG tablet Take 1 tablet (8 mg total) by mouth every 8 (eight) hours as needed for nausea or vomiting. 90 tablet 1   polyethylene glycol powder (GLYCOLAX /MIRALAX ) 17 GM/SCOOP  powder Take 17 g by mouth daily. (Patient taking differently: Take 17 g by mouth in the morning and at bedtime.) 500 g 10   RETIN-A  MICRO 0.04 % gel Apply 1 application  topically daily as needed.       RHOPRESSA  0.02 % SOLN Place 1 drop into the right eye at bedtime.       sucralfate  (CARAFATE ) 1 GM/10ML suspension Take 10 mLs (1 g total) by mouth 4 (four) times daily -  with meals and at bedtime. (Patient taking differently: Take 1 g by mouth 2 (two) times daily.) 420 mL 3   vitamin B-12 (VITAMIN B12) 500 MCG tablet Take 1 tablet (500 mcg total) by mouth daily. 28 tablet 0   Adalimumab 40 MG/0.4ML PNKT Inject 40 mg into the skin every 14 (fourteen) days.                Home: Home Living Family/patient expects to be discharged to:: Private residence Living Arrangements: Parent, Other relatives (brother and uncle) Available Help at Discharge: Family, Available 24 hours/day Type of Home: House Home Access: Stairs to enter Secretary/administrator of Steps: 3 Entrance Stairs-Rails: Left Home Layout: One level Bathroom Shower/Tub: Engineer, manufacturing systems: Standard Bathroom Accessibility: Yes Home Equipment: Agricultural consultant (2 wheels), BSC/3in1  Lives With: Family   Functional History: Prior Function Prior Level of Function : Needs assist Physical Assist : ADLs (physical), Mobility (physical) Mobility (physical): Gait, Stairs ADLs (physical): IADLs, Bathing, Dressing Mobility Comments: uses RW for longer distances in the house, otherwise furniture/wall walks, brothers help pt rise into standing until steady then hand her RW ADLs Comments: family helps her in and out of tub to shower seat and helps her get washed up, she dresses herself, A for toileting.   Functional Status:  Mobility: Bed Mobility Overal bed mobility: Needs Assistance Bed Mobility: Rolling, Sidelying to Sit, Sit to Supine Rolling: Supervision Sidelying to  sit: Mod assist Supine to sit: Mod assist Sit to  supine: Mod assist General bed mobility comments: Pt transitions from supine to moving BLE off EOB with min assist, then in sidelying position, requires mod assist to upright trunk with HOB fully elevated, bed rails. Transfers Overall transfer level: Needs assistance Equipment used: Ambulation equipment used Transfers: Sit to/from Stand Sit to Stand: +2 physical assistance, Max assist, +2 safety/equipment, Via lift equipment Bed to/from chair/wheelchair/BSC transfer type:: Via Financial planner via Lift Equipment: Stedy General transfer comment: STS with max assist from elevated EOB with cuing re: hand placement, counting "1-2-3" to initiate, cuing & encouragement to attempt; pt requires 2 attempts for successful STS from EOB. Pt transfers STS from stedy seat with supervision x 1 attempt, but clears buttocks & continues to lean chest on front bar, unable to shift pelvis anteriorly or upright trunk. Pt with no improvement with upright posture even when PT & mobility specialist provide +2 assist & cuing/demo. Ambulation/Gait General Gait Details: unable to take steps in standing   ADL: ADL Overall ADL's : Needs assistance/impaired Eating/Feeding: Independent, Bed level Grooming: Bed level, Set up Upper Body Bathing: Bed level, Minimal assistance Lower Body Bathing: Maximal assistance, Bed level, Sitting/lateral leans Upper Body Dressing : Bed level, Moderate assistance Lower Body Dressing: Total assistance, Sitting/lateral leans Toilet Transfer Details (indicate cue type and reason): unable to stand on this date, anticipate Max A +2 Toileting- Clothing Manipulation and Hygiene: Maximal assistance, +2 for safety/equipment, +2 for physical assistance General ADL Comments: worked with pt on cervical control, sitting balance, and exercises EOB   Cognition: Cognition Orientation Level: Oriented X4 Cognition Arousal: Alert Behavior During Therapy: Anxious, Lability     Blood pressure  (!) 150/100, pulse (!) 116, temperature 97.6 F (36.4 C), temperature source Oral, resp. rate 17, height 5\' 9"  (1.753 m), weight (!) 149 kg, SpO2 97%. Physical Exam Vitals and nursing note reviewed.  Constitutional:      Appearance: Normal appearance. She is obese.     Comments: Woke pt up- supine in bed- mouth full of spit even after she spit it out- very sleepy- NAD; BMI 48  HENT:     Head: Normocephalic and atraumatic.     Comments: Mouth full of saliva- harder to talk    Nose: Nose normal.     Mouth/Throat:     Mouth: Mucous membranes are moist.     Pharynx: Oropharyngeal exudate present.  Eyes:     Extraocular Movements: Extraocular movements intact.  Cardiovascular:     Rate and Rhythm: Regular rhythm. Tachycardia present.     Heart sounds: Normal heart sounds. No murmur heard.    No gallop.  Pulmonary:     Effort: Pulmonary effort is normal. No respiratory distress.     Breath sounds: Normal breath sounds. No wheezing, rhonchi or rales.  Abdominal:     General: Bowel sounds are normal. There is no distension.     Palpations: Abdomen is soft.     Tenderness: There is no abdominal tenderness.  Musculoskeletal:     Cervical back: Neck supple.     Comments: Ue's 5-/5 B/L LE's HF 3-/5 at best B/L; KE/KF 4-/5 B/L; DF and PF 4/5- but limited by pain due to severe pain with light touch  Skin:    General: Skin is warm and dry.     Comments: Port accessed in R chest  Neurological:     Mental Status: She is alert and oriented to person, place,  and time.     Comments: C/o pain and neuropathy in feet- very TTP   Psychiatric:     Comments: Sleepy, flat        Lab Results Last 48 Hours        Results for orders placed or performed during the hospital encounter of 07/14/23 (from the past 48 hours)  Comprehensive metabolic panel     Status: Abnormal    Collection Time: 07/19/23  1:31 AM  Result Value Ref Range    Sodium 132 (L) 135 - 145 mmol/L    Potassium 3.9 3.5 - 5.1  mmol/L    Chloride 102 98 - 111 mmol/L    CO2 23 22 - 32 mmol/L    Glucose, Bld 105 (H) 70 - 99 mg/dL      Comment: Glucose reference range applies only to samples taken after fasting for at least 8 hours.    BUN 10 6 - 20 mg/dL    Creatinine, Ser 2.13 0.44 - 1.00 mg/dL    Calcium  8.9 8.9 - 10.3 mg/dL    Total Protein 8.8 (H) 6.5 - 8.1 g/dL    Albumin 1.9 (L) 3.5 - 5.0 g/dL    AST 086 (H) 15 - 41 U/L    ALT 86 (H) 0 - 44 U/L    Alkaline Phosphatase 67 38 - 126 U/L    Total Bilirubin 0.6 0.0 - 1.2 mg/dL    GFR, Estimated >57 >84 mL/min      Comment: (NOTE) Calculated using the CKD-EPI Creatinine Equation (2021)      Anion gap 7 5 - 15      Comment: Performed at [facility], [address] [place], [place] [address]  CK     Status: Abnormal    Collection Time: 07/19/23  1:31 AM  Result Value Ref Range    Total CK 514 (H) 38 - 234 U/L      Comment: Performed at [facility], [address] [place], [place] [address]      Imaging Results (Last 48 hours)  No results found.         Blood pressure (!) 150/100, pulse (!) 116, temperature 97.6 F (36.4 C), temperature source Oral, resp. rate 17, height 5\' 9"  (1.753 m), weight (!) 149 kg, SpO2 97%.   Medical Problem List and Plan: 1. Functional deficits secondary to myositis- since was getting better, Neuro declined muscle biopsy             -patient may not shower for the moment- will need to determine with R chest port? If unaccessed?             -ELOS/Goals: min A hopefully             Admit to CIR 2.  Antithrombotics: -DVT/anticoagulation:  Pharmaceutical: Lovenox              -antiplatelet therapy: N/A 3. Pain Management: Has been using oxycodone  prn past 3 days? For foot, leg and back pain- also has Gabapentin  - duloxetine  stopped initially due to myositis- 30 mg resumed 4/23 4. Mood/Behavior/Sleep: LCSW to follow for evaluation and support.              -antipsychotic agents: N/A 5. Neuropsych/cognition: This  patient is capable of making decisions on her own behalf. 6. Skin/Wound Care: Routine pressure relief measures.  7. Fluids/Electrolytes/Nutrition: Monitor I/O. Continue Ensure supplements             --check  CMET in am 8. Non-specific myositis: Cymbalta  d/c by neurology due to concerns of rhabdomyolysis --30 mg resumed 04/23 given improving myositis.  --Monitor CK intermittently improved from 1536-->514 --consider muscle biopsy if symptoms do not improve 9. Multifocal PNA: Likely aspiration--most significant in lingula and RLL on CT w/o contrast chest 07/18/23 --Hx of chronic intermittent N/V. May need swallow evaluation. Swallow precautions added.   --started on Augmentin  04/22-->04/25 for 5 day course.  11. Multiple Vitamin deficiencies: Vitamin B1  250 mg daily 4/22-->4/28 followed by 100 mg daily IV.              --now on B12 IM, IV folate. Vitamin A  10,000 units daily. Vitamin D  50,000/wk             --recheck Mg, Phos (supplemented) 12. Resting tachycardia: HR has been 110-120 range at baseline --not on any BB 13. Accelerated HTN: BP poorly controlled. Continue hydrochlorothiazide  and Avapro  -- addition of BB? 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              --IVIG 04/21- 04/24. Followed by Dr. Curley Double. 15. Urinary retention: Purewick being used--monitor voiding with PVR/bladder scan. Toilet every 4 hours 16. Chronic Asthma: Continue Dulera  BID and singulair  --Followed by Dr. Idolina Maker.  17. H/o chronic gastritis w/nausea and vomiting: D/c Ibuprofen . Continue Protonix  40 mg and Pepcid  -- Followed by [facility].  18. Abnormal LFTs: Normal liver parenchyma and GB sludge noted on abdominal ultrasound 07/16/23.  --Resolving. Recheck in am 19. Peripheral polyneuropathy: Cymbalta  resumed 04/23 as CK trending down. Will check weekly --Gabapentin  600 mg TID 18. Anemia of chronic disease: Likely malabsorption w/ Low iron but  elevated TIBC/Ferritin 19. Morbid obesity: BMI 48. Followed by RD/Bariatric nutrition for wt loss. Plan for Bariatric surgery 08/2023.              --Now on aggressive vitamin supplementation.  20. Hyponatremia: Na down to 132-->question due to IVIG 4/31-4/24.  --May see rise in LFTs again.  21. Severe tricuspid regurgitation on ECHO         Pamela S Love, PA-C 07/20/2023     I have personally performed a face to face diagnostic evaluation of this patient and formulated the key components of the plan.  Additionally, I have personally reviewed laboratory data, imaging studies, as well as relevant notes and concur with the physician assistant's documentation above.   The patient's status has not changed from the original H&P.  Any changes in documentation from the acute care chart have been noted above.

## 2023-07-20 NOTE — Discharge Instructions (Addendum)
 Dear Tiffany Velasquez,   Thank you so much for allowing us  to be part of your care!  You were admitted to [facility] for leg weakness and pain. You are low on several vitamins so you were started on supplements. We think the low vitamin levels may have been contributing to your symptoms. There are still some other tests we're working up to further investigate your symptoms. While you were admitted, you were also found to have pneumonia and were treated with antibiotics for it. You will continue to work with rehab in the hospital to continue building your strength and balance.    POST-HOSPITAL & CARE INSTRUCTIONS Please schedule an appointment to see your PCP the week after you finish inpatient rehab.  Please let PCP/Specialists know of any changes that were made.  Please see medications section of this packet for any medication changes.   DOCTOR'S APPOINTMENT & FOLLOW UP CARE INSTRUCTIONS  Future Appointments  Date Time Provider Department Center  08/01/2023 11:20 AM Lylia Sand, MD [facility] CPR  08/03/2023 10:00 AM Margorie Shelter, RD [facility] NDM  08/04/2023  9:30 AM Reyna Cava K, DO [facility] None  08/04/2023  3:40 PM Armbruster, Lendon Queen, MD [facility]  08/08/2023 11:15 AM Floyce Hutching, DPM [facility]  11/23/2023 11:50 AM [facility] ANNUAL WELLNESS VISIT [facility]     Take care and be well!  Family Medicine Teaching Service  [place]  [facility]  [address] [place], [place] [address] [phone]

## 2023-07-20 NOTE — Assessment & Plan Note (Addendum)
 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 Per pharmacy, patient due for IVIG infusion on 4/19. Will conitnue IVIG infusion per patient's home regimen.  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 (see MAR) Retinal edema: Continue acetazolamide  500 mg twice daily Gout: Continue allopurinol  100 mg daily, holding colchicine  as can contribute to peripheral neuropathy GERD: Continue famotidine  40 mg daily, pantoprazole  40 mg daily (substitute for Dexlansoprazole ) Asthma: Continue Dulera  2 puffs twice daily (substitute for Symbicort ), albuterol  nebulizer PRN, montelukast  10 mg daily Neuropathy: Gabapentin  600 mg TID, Cymbalta  30 daily  HTN: continue irbesartan  150 + hydrochlorothiazide  12.5 daily

## 2023-07-20 NOTE — Assessment & Plan Note (Addendum)
 AST and ALT have remained stablely elevated during admission with AST/ALT ratio of ~3. GGT also elevated. Patient denies any alcohol use. RUQ US  with gallbladder sludge, otherwise unremarkable.

## 2023-07-20 NOTE — Progress Notes (Signed)
 Celia Coles, MD  Physician Physical Medicine and Rehabilitation   PMR Pre-admission    Signed   Date of Service: 07/17/2023  1:25 PM  Related encounter: ED to Hosp-Admission (Current) from 07/14/2023 in [facility]   Signed     Expand All Collapse All  Show:Clear all [x] Written[x] Templated[x] Copied  Added by: [x] Dorenda Gandy, RN[x] Amiel Kalata, CCC-SLP  [] Hover for details PMR Admission Coordinator Pre-Admission Assessment   Patient: Tiffany Velasquez is an 28 y.o., female MRN: [id number] DOB: 20-Jan-1996 Height: 5\' 9"  (175.3 cm) Weight: (!) 149 kg                                                                                                                                           Insurance Information HMO: yes    PPO:      PCP:      IPA:      80/20:      OTHER:  [employer] HMO      Policy#: [id number]      Subscriber: patient CM Name: Lucinda Saber     Phone#: [phone] ext [phone]   Fax#: [phone] Pre-Cert#: [id number]  approved 4/24 until 5/1 and f/u with [facility] phone [phone] ext [phone] fax [phone]      Employer:  Benefits:  Phone #: [phone]     Name: 4/23 Eff. Date: 03/29/23     Deduct: $257      Out of Pocket Max: $9350      Life Max: none  CIR: $2185 co pay per admission      SNF: no copay per day dasy 1 until 20; $214 co pay per day days 21 until 100 Outpatient: 80%     Co-Pay: 20% Home Health: 100%      Co-Pay: none DME: 80%     Co-Pay: 20% Providers: in-network  SECONDARY:   Medicaid of [place]    Policy#: [id number]      Phone#:  Passport one source online active 07/19/23 MADQY   Financial Counselor:       Phone#:    The "Data Collection Information Summary" for patients in Inpatient Rehabilitation Facilities with attached "Privacy Act Statement-Health Care Records" was provided and verbally reviewed with: Patient   Emergency Contact Information Contact Information       Name Relation  Home Work Mobile    Vandyken,Cynthia Mother [phone]             Other Contacts   None on File      Current Medical History  Patient Admitting Diagnosis: debility   History of Present Illness:  Tiffany Velasquez is a 28 y.o. female with a history of multiple vitamin deficiencies, abnormal immunoglobulins, focal chorioretinal inflammation, HTN, neuropathy who was admitted on 07/15/23 with progressive weakness, sensory loss, and pupillary defect. Pt was started on IVIG infusions for focal  chorioretinal inflammation as she receives IVIG monthly on an OP basis.     She was found to have thiamine  and B12 deficiencies which are being treated aggressively, in addition to elevation of multiple non-specific inflammatory markers including CK. Thighs have been tender.. Abnormal SPEP difficult to interpret with IVIG infusions ongoing. The plan at present is to hold off on other serological antibody testing at this time. Anti-Jo1 antibody is pending however. MRI of head, cervical, thoracic, and lumbar spine were negative while scan of left thigh was notable for diffuse myositis.     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 May be related secondary to recent fall vs other inflammatory process given elevate ESR. To continue B12 injections. IV folate, IV thiamine , MVI, vit D supplementation. Begin vit A repletion. Will follow up as OP for Rheumatology. Lovenox  for DVT prophylaxis.    Patient has lost 170 lbs over the past year since beginning her weight loss journey per nutritionist. Visits with OP RD monthly since November for supervised Weight Loss Visit. Pending Bariatric surgery this summer.   With PT pt has been mod assist for sit-std transfers using STEDY. She has not stood for any period of time nor has she ambulated. Pt lives in a one level home with 4 steps to enter with her family. Pt was using a RW/furniture walking prior to this admit.  Glasgow Coma Scale Score: 15   Patient's medical record  from [facility] has been reviewed by the rehabilitation admission coordinator and physician.   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 No   Family History  family history includes Allergic rhinitis in her father; Asthma in her mother; Sudden death in her cousin.   Current Medications   Current Medications    Current Facility-Administered Medications:    acetaZOLAMIDE  (DIAMOX ) tablet 500 mg, 500 mg, Oral, BID, Baloch, Mahnoor, MD, 500 mg at 07/20/23 0950   albuterol  (PROVENTIL ) (2.5 MG/3ML) 0.083% nebulizer solution 2.5 mg, 2.5 mg, Nebulization, Q6H PRN, Spence, Sarah, DO   allopurinol  (ZYLOPRIM ) tablet 100 mg, 100 mg, Oral, Daily, Omar Bibber, DO, 100 mg at 07/20/23 1610   amoxicillin -clavulanate (AUGMENTIN ) 875-125 MG per tablet 1 tablet, 1 tablet, Oral, Q12H, Carey Chapman, MD, 1 tablet at 07/20/23 9604   azelastine  (ASTELIN ) 0.1 % nasal spray 2 spray, 2 spray, Each Nare, BID, Omar Bibber, DO, 2 spray at 07/20/23 1004   brimonidine  (ALPHAGAN ) 0.2 % ophthalmic solution 1 drop, 1 drop, Both Eyes, TID, Spence, Sarah, DO, 1 drop at 07/20/23 1003   Chlorhexidine  Gluconate Cloth 2 % PADS 6 each, 6 each, Topical, Daily, McDiarmid, Demetra Filter, MD, 6 each at 07/20/23 1012   cyanocobalamin  (VITAMIN B12) injection 1,000 mcg, 1,000 mcg, Intramuscular, Daily, 1,000 mcg at 07/20/23 0956 **FOLLOWED BY** [START ON 07/22/2023] cyanocobalamin  (VITAMIN B12) tablet 1,000 mcg, 1,000 mcg, Oral, Daily, Bhagat, Srishti L, MD   dorzolamide -timolol  (COSOPT ) 2-0.5 % ophthalmic solution 1 drop, 1 drop, Both Eyes, BID, Spence, Sarah, DO, 1 drop at 07/20/23 1003   DULoxetine  (CYMBALTA ) DR capsule 30 mg, 30 mg, Oral, Daily, Carey Chapman, MD,  30 mg at  07/20/23 0951   enoxaparin  (LOVENOX ) injection 70 mg, 70 mg, Subcutaneous, Q24H, Omar Bibber, DO, 70 mg at 07/20/23 7846   famotidine  (PEPCID ) tablet 40 mg, 40 mg, Oral, QHS, Spence, Sarah, DO, 40 mg at 07/19/23 2254   folic acid  injection 1 mg, 1 mg, Intravenous, Daily, McDiarmid, Demetra Filter, MD, 1 mg at 07/20/23 1149   gabapentin  (NEURONTIN ) capsule 600 mg, 600 mg, Oral, TID, Spence, Sarah, DO, 600 mg at 07/20/23 9629   irbesartan  (AVAPRO ) tablet 150 mg, 150 mg, Oral, Daily, 150 mg at 07/20/23 0950 **AND** hydrochlorothiazide  (HYDRODIURIL ) tablet 12.5 mg, 12.5 mg, Oral, Daily, McDiarmid, Demetra Filter, MD, 12.5 mg at 07/20/23 5284   ibuprofen  (ADVIL ) tablet 600 mg, 600 mg, Oral, Q6H PRN, Carey Chapman, MD, 600 mg at 07/20/23 1014   Immune Globulin  10% (PRIVIGEN ) IV infusion 80 g, 80 g, Intravenous, Q24H, McDiarmid, Demetra Filter, MD, Last Rate: 0 mL/hr at 07/18/23 2127, 80 g at 07/19/23 1643   latanoprost  (XALATAN ) 0.005 % ophthalmic solution 1 drop, 1 drop, Both Eyes, QHS, Spence, Sarah, DO, 1 drop at 07/20/23 0000   lidocaine  (LMX) 4 % cream, , Topical, TID, Bhagat, Srishti L, MD, 1 Application at 07/20/23 1004   mometasone -formoterol  (DULERA ) 200-5 MCG/ACT inhaler 2 puff, 2 puff, Inhalation, BID, Omar Bibber, DO, 2 puff at 07/20/23 0806   montelukast  (SINGULAIR ) tablet 10 mg, 10 mg, Oral, QHS, Spence, Sarah, DO, 10 mg at 07/19/23 2254   multivitamin with minerals tablet 1 tablet, 1 tablet, Oral, Daily, Bhagat, Srishti L, MD, 1 tablet at 07/20/23 1001   ondansetron  (ZOFRAN -ODT) disintegrating tablet 4 mg, 4 mg, Oral, Q8H PRN, Omar Bibber, DO, 4 mg at 07/15/23 1324   Oral care mouth rinse, 15 mL, Mouth Rinse, PRN, McDiarmid, Demetra Filter, MD   pantoprazole  (PROTONIX ) EC tablet 40 mg, 40 mg, Oral, Q0600, McDiarmid, Demetra Filter, MD, 40 mg at 07/20/23 0950   polyethylene glycol (MIRALAX  / GLYCOLAX ) packet 17 g, 17 g, Oral, Daily, Omar Bibber, DO, 17 g at 07/20/23 4010   protein supplement (ENSURE MAX) liquid, 11  oz, Oral, BID, McDiarmid, Demetra Filter, MD, 11 oz at 07/20/23 1012   sodium chloride  flush (NS) 0.9 % injection 10-40 mL, 10-40 mL, Intracatheter, PRN, McDiarmid, Demetra Filter, MD   [COMPLETED] thiamine  (VITAMIN B1) 500 mg in sodium chloride  0.9 % 50 mL IVPB, 500 mg, Intravenous, Q8H, Last Rate: 110 mL/hr at 07/17/23 2148, 500 mg at 07/17/23 2148 **FOLLOWED BY** thiamine  (VITAMIN B1) 250 mg in sodium chloride  0.9 % 50 mL IVPB, 250 mg, Intravenous, Daily, Last Rate: 105 mL/hr at 07/20/23 0952, 250 mg at 07/20/23 0952 **FOLLOWED BY** [START ON 07/24/2023] thiamine  (VITAMIN B1) injection 100 mg, 100 mg, Intravenous, Daily, Bhagat, Srishti L, MD   vitamin A  capsule 10,000 Units, 10,000 Units, Oral, Daily, Carey Chapman, MD, 10,000 Units at 07/20/23 0950   Vitamin D  (Ergocalciferol ) (DRISDOL ) 1.25 MG (50000 UNIT) capsule 50,000 Units, 50,000 Units, Oral, Q7 days, Dema Filler, MD, 50,000 Units at 07/15/23 1822     Patients Current Diet:  Diet Order                  Diet regular Room service appropriate? Yes; Fluid consistency: Thin  Diet effective now                       Precautions / Restrictions Precautions Precautions: Fall Precaution/Restrictions Comments: 6 falls recently Restrictions Weight Bearing Restrictions Per Provider Order: No    Has the patient  had 2 or more falls or a fall with injury in the past year?Yes   Prior Activity Level Limited Community (1-2x/wk): gets of out of house ~2 days/week She is in school at [employer] for [employer] certificate   Prior Functional Level Prior Function Prior Level of Function : Needs assist Physical Assist : ADLs (physical), Mobility (physical) Mobility (physical): Gait, Stairs ADLs (physical): IADLs, Bathing, Dressing Mobility Comments: uses RW for longer distances in the house, otherwise furniture/wall walks, brothers help pt rise into standing until steady then hand her RW ADLs Comments: family helps her in and out of tub to shower seat and helps her  get washed up, she dresses herself, A for toileting.   Self Care: Did the patient need help bathing, dressing, using the toilet or eating?  Needed some help   Indoor Mobility: Did the patient need assistance with walking from room to room (with or without device)? Independent   Stairs: Did the patient need assistance with internal or external stairs (with or without device)? Needed some help   Functional Cognition: Did the patient need help planning regular tasks such as shopping or remembering to take medications? Needed some help   Patient Information Are you of Hispanic, Latino/a,or Spanish origin?: A. No, not of Hispanic, Latino/a, or Spanish origin What is your race?: B. Black or African American Do you need or want an interpreter to communicate with a doctor or health care staff?: 0. No   Patient's Response To:  Health Literacy and Transportation Is the patient able to respond to health literacy and transportation needs?: Yes Health Literacy - How often do you need to have someone help you when you read instructions, pamphlets, or other written material from your doctor or pharmacy?: Never In the past 12 months, has lack of transportation kept you from medical appointments or from getting medications?: No In the past 12 months, has lack of transportation kept you from meetings, work, or from getting things needed for daily living?: No   Home Assistive Devices / Equipment Home Equipment: Agricultural consultant (2 wheels), BSC/3in1   Prior Device Use: Indicate devices/aids used by the patient prior to current illness, exacerbation or injury? Walker and cane   Current Functional Level Cognition   Orientation Level: Oriented X4    Extremity Assessment (includes Sensation/Coordination)   Upper Extremity Assessment: Generalized weakness  Lower Extremity Assessment: Generalized weakness RLE Deficits / Details: Able to actively flex/extend hip and knee RLE Sensation: history of peripheral  neuropathy LLE Deficits / Details: actively felx/extend hips and knees LLE Sensation: history of peripheral neuropathy     ADLs   Overall ADL's : Needs assistance/impaired Eating/Feeding: Independent, Bed level Grooming: Bed level, Set up Upper Body Bathing: Bed level, Minimal assistance Lower Body Bathing: Maximal assistance, Bed level, Sitting/lateral leans Upper Body Dressing : Bed level, Moderate assistance Lower Body Dressing: Total assistance, Sitting/lateral leans Toilet Transfer Details (indicate cue type and reason): unable to stand on this date, anticipate Max A +2 Toileting- Clothing Manipulation and Hygiene: Maximal assistance, +2 for safety/equipment, +2 for physical assistance General ADL Comments: worked with pt on cervical control and progressing OOB mobility     Mobility   Overal bed mobility: Needs Assistance Bed Mobility: Rolling Rolling: Supervision Sidelying to sit: Mod assist Supine to sit: Mod assist Sit to supine: Mod assist, +2 for physical assistance General bed mobility comments: Pt transitions from supine to moving BLE off EOB with min assist, then in sidelying position, requires mod assist to upright  trunk. Mod A +2 to LE's for return to supine. Bed placed in [place] and had pt participate with rolling and scooting up using bed rails and HOB with knees bridged to pull/ push to [facility]     Transfers   Overall transfer level: Needs assistance Equipment used: Ambulation equipment used, Rolling walker (2 wheels) Transfers: Sit to/from Stand Sit to Stand: +2 physical assistance, Max assist, +2 safety/equipment, Mod assist, Via lift equipment Bed to/from chair/wheelchair/BSC transfer type:: Via Financial planner via Lift Equipment: Stedy General transfer comment: first used RW and pt sit>stand 2x with mod A +2. Immediate return to sitting first time, maintained standing 5 secs second time with full clearance of buttocks. After this, attempted standing in  stedy multiple times for pivot to recliner but pt unable to clear buttocks again, max A +2 given     Ambulation / Gait / Stairs / Wheelchair Mobility   Ambulation/Gait General Gait Details: unable     Posture / Balance Dynamic Sitting Balance Sitting balance - Comments: pt holding head up for longer periods of time in sitting today. Pt able to lean down with elbows on knees and return to sitting. Balance Overall balance assessment: Needs assistance Sitting-balance support: No upper extremity supported, Feet supported Sitting balance-Leahy Scale: Fair Sitting balance - Comments: pt holding head up for longer periods of time in sitting today. Pt able to lean down with elbows on knees and return to sitting. Standing balance support: Bilateral upper extremity supported, Reliant on assistive device for balance Standing balance-Leahy Scale: Zero Standing balance comment: BUE support on stedy/ RW     Special needs/care consideration External Urinary Catheter Monthly IVIG daily x 4 doses as an OP Bariatric        Previous Home Environment  Living Arrangements: Parent, Other relatives (brother and uncle)  Lives With: Family Available Help at Discharge: Family, Available 24 hours/day Type of Home: House Home Layout: One level Home Access: Stairs to enter Entrance Stairs-Rails: Left Entrance Stairs-Number of Steps: 3 Bathroom Shower/Tub: Engineer, manufacturing systems: Standard Bathroom Accessibility: Yes How Accessible: Accessible via walker Home Care Services: No   Discharge Living Setting Plans for Discharge Living Setting: Patient's home Type of Home at Discharge: House Discharge Home Layout: One level Discharge Home Access: Stairs to enter Entrance Stairs-Rails: Left Entrance Stairs-Number of Steps: 3 Discharge Bathroom Shower/Tub: Tub/shower unit Discharge Bathroom Toilet: Standard Discharge Bathroom Accessibility: Yes How Accessible: Accessible via walker Does the  patient have any problems obtaining your medications?: No   Social/Family/Support Systems Anticipated Caregiver: Sharlett Lienemann, mother Anticipated Caregiver's Contact Information: [phone] Caregiver Availability: 24/7 Discharge Plan Discussed with Primary Caregiver: Yes Is Caregiver In Agreement with Plan?: Yes Does Caregiver/Family have Issues with Lodging/Transportation while Pt is in Rehab?: No   Goals Patient/Family Goal for Rehab: Supervision: PT/OT Expected length of stay: 13-20 days Pt/Family Agrees to Admission and willing to participate: Yes Program Orientation Provided & Reviewed with Pt/Caregiver Including Roles  & Responsibilities: Yes   Decrease burden of Care through IP rehab admission: NA   Possible need for SNF placement upon discharge:Not anticipated   Patient Condition: This patient's condition remains as documented in the consult dated 07/17/23, in which the Rehabilitation Physician determined and documented that the patient's condition is appropriate for intensive rehabilitative care in an inpatient rehabilitation facility. Will admit to inpatient rehab today.   Preadmission Screen Completed By:  Jeannetta Millman RN MSN, 07/20/2023 3:31 PM ______________________________________________________________________   Discussed status with Dr. Lovorn on 07/20/23  at 1532 and received approval for admission today.   Admission Coordinator:  Jeannetta Millman RN MSN, time 2130 Date 07/20/23            Revision History  Routing History

## 2023-07-20 NOTE — Assessment & Plan Note (Signed)
>>  ASSESSMENT AND PLAN FOR TRANSAMINITIS WRITTEN ON 07/20/2023 11:51 AM BY Carey Chapman, MD  AST and ALT have remained stablely elevated during admission with AST/ALT ratio of ~3. GGT also elevated. Patient denies any alcohol use. RUQ US  with gallbladder sludge, otherwise unremarkable.

## 2023-07-20 NOTE — Progress Notes (Signed)
 PT c/o of severe pain to bilateral feet requesting hs dose of gabapentin , admissions order has it starting the at 0800. Pt only received 2 out of the 3 doses ordered for today. Called oncall and asked pharmacy for dose that pt did not get. One time doses ordered and given.

## 2023-07-20 NOTE — Assessment & Plan Note (Addendum)
 Continues to have intermittent tachycardia. CT chest concerning for multifocal pneumonia.  Echo with EF 55, G1DD, mod-severe tricuspid regurg. 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  - Augmentin  twice daily (5-day course)

## 2023-07-20 NOTE — Assessment & Plan Note (Addendum)
 Stable to improving neuro exam, with reported improvement of symptoms. Overall LE weakness concerning for known vitamin deficiency (A, D, B1, B9, B12) vs component of autoimmune inflammatory etiology. L femur MRI with signs of myositis, suspect secondary to recent fall. Reassuringly, CK downtrended and patient improving symptomatically. Per neurology, abnormal SPEP results difficult to interpret in setting of chronic IVIG infusions. Vitamin E wnl. Anti-jo negative.  - Neurology following, appreciate recs - Continue B12 injections, IV folate, IV thiamine , MVI, Vit D supplemenation - Continue vitamin A  repletion   - Vit K lab in process - Ald/Renin in process - Anti-scleroderma and centromere antibodies in process - Strep pneumo and legionella urine antigen pending  - Unfortunately unable to obtain inpatient Rheumatology consult. [facility] recommends outpatient referral as indicated.  - PT/OT to treat - recommending CIR at this time, will re-eval as indicated

## 2023-07-20 NOTE — Assessment & Plan Note (Addendum)
 Hg stable. Elevated ferritin, suspect inflammatory. Normal iron.  - Recheck CBC as needed

## 2023-07-20 NOTE — Progress Notes (Signed)
 Occupational Therapy Treatment Patient Details Name: Tiffany Velasquez MRN: [id number] DOB: Nov 21, 1995 Today's Date: 07/20/2023   History of present illness Pt is 28 y.o. female presenting 07/14/23 with 2 months of progressive weakness. Pt has fallen 6 times in the past 2 weeks. She was recently admitted 4/11 for generalized weakness as well; found to have significant nutritional deficiencies (including B12) in the setting of an aggressive weight loss program;  PMH asthma, glaucoma, panuveitis, HTN, anemia and peripheral neuropathy, obesity.   OT comments  Pt demonstrating improving cervical strength and control, she remains anxious with OOB mobility and needs consistent reassurance. Pt able to progress to stand with Mod A +2 from EOB using RW but continued to need increased assist with more fatigue.OT to continue to progress pt as able, DC plans remain appropriate for CIR.       If plan is discharge home, recommend the following:  A lot of help with walking and/or transfers;Two people to help with walking and/or transfers;A lot of help with bathing/dressing/bathroom;Assistance with Charity fundraiser (measurements OT);Wheelchair cushion (measurements OT);Hospital bed    Recommendations for Other Services      Precautions / Restrictions Precautions Precautions: Fall Recall of Precautions/Restrictions: Intact Precaution/Restrictions Comments: 6 falls recently Restrictions Weight Bearing Restrictions Per Provider Order: No       Mobility Bed Mobility Overal bed mobility: Needs Assistance Bed Mobility: Rolling Rolling: Supervision Sidelying to sit: Mod assist   Sit to supine: Mod assist, +2 for physical assistance   General bed mobility comments: Pt transitions from supine to moving BLE off EOB with min assist, then in sidelying position, requires mod assist to upright trunk. Mod A +2 to LE's for return to supine. Bed placed in [place] and  had pt participate with rolling and scooting up using bed rails and HOB with knees bridged to pull/ push to [facility]    Transfers Overall transfer level: Needs assistance Equipment used: Ambulation equipment used, Rolling walker (2 wheels) Transfers: Sit to/from Stand Sit to Stand: +2 physical assistance, Max assist, +2 safety/equipment, Mod assist, Via lift equipment           General transfer comment: first used RW and pt sit>stand 2x with mod A +2. Immediate return to sitting first time, maintained standing 5 secs second time with full clearance of buttocks. After this, attempted standing in stedy multiple times for pivot to recliner but pt unable to clear buttocks again, max A +2 given Transfer via Lift Equipment: Stedy   Balance Overall balance assessment: Needs assistance Sitting-balance support: No upper extremity supported, Feet supported Sitting balance-Leahy Scale: Fair Sitting balance - Comments: pt holding head up for longer periods of time in sitting today. Pt able to lean down with elbows on knees and return to sitting.   Standing balance support: Bilateral upper extremity supported, Reliant on assistive device for balance Standing balance-Leahy Scale: Zero Standing balance comment: BUE support on stedy/ RW                           ADL either performed or assessed with clinical judgement   ADL Overall ADL's : Needs assistance/impaired                                       General ADL Comments: worked with pt on cervical control and progressing OOB mobility  Extremity/Trunk Assessment              Occupational psychologist Communication: No apparent difficulties   Cognition Arousal: Alert Behavior During Therapy: Lability, Anxious Cognition: No apparent impairments             OT - Cognition Comments: Anxious with mobility, states "I miss my mom"                  Following commands: Impaired Following commands impaired: Follows one step commands with increased time, Follows one step commands inconsistently      Cueing   Cueing Techniques: Verbal cues, Tactile cues, Gestural cues, Visual cues  Exercises Other Exercises Other Exercises: AROM cerival flexion/ext, rotations, and lateral flexions    Shoulder Instructions       General Comments bed linens changed along with gown as purewick not fully catching urine. Notiied NTs    Pertinent Vitals/ Pain       Pain Assessment Pain Assessment: Faces Faces Pain Scale: Hurts little more Pain Location: Legs Pain Descriptors / Indicators: Discomfort, Aching Pain Intervention(s): Limited activity within patient's tolerance, Monitored during session  Home Living                                          Prior Functioning/Environment              Frequency  Min 2X/week        Progress Toward Goals  OT Goals(current goals can now be found in the care plan section)  Progress towards OT goals: Progressing toward goals  Acute Rehab OT Goals Patient Stated Goal: To get better OT Goal Formulation: With patient Time For Goal Achievement: 07/29/23 Potential to Achieve Goals: Good  Plan      Co-evaluation    PT/OT/SLP Co-Evaluation/Treatment: Yes Reason for Co-Treatment: Complexity of the patient's impairments (multi-system involvement);To address functional/ADL transfers PT goals addressed during session: Mobility/safety with mobility;Strengthening/ROM;Proper use of DME;Balance OT goals addressed during session: ADL's and self-care;Proper use of Adaptive equipment and DME      AM-PAC OT "6 Clicks" Daily Activity     Outcome Measure   Help from another person eating meals?: A Little Help from another person taking care of personal grooming?: A Little Help from another person toileting, which includes using toliet, bedpan, or urinal?: A Lot Help from another  person bathing (including washing, rinsing, drying)?: A Lot Help from another person to put on and taking off regular upper body clothing?: A Lot Help from another person to put on and taking off regular lower body clothing?: Total 6 Click Score: 13    End of Session Equipment Utilized During Treatment: Gait belt;Rolling walker (2 wheels);Other (comment) Octaviano Belts)  OT Visit Diagnosis: Unsteadiness on feet (R26.81);Other abnormalities of gait and mobility (R26.89);Muscle weakness (generalized) (M62.81);Pain;Low vision, both eyes (H54.2) Pain - Right/Left:  (bilat) Pain - part of body: Leg   Activity Tolerance Patient tolerated treatment well   Patient Left in bed;with call bell/phone within reach   Nurse Communication Need for lift equipment (+2-3 stedy if attempting to get OOB,  or lift pad)        Time: 1610-9604 OT Time Calculation (min): 50 min  Charges: OT General Charges $OT Visit: 1 Visit OT Treatments $Therapeutic Activity: 23-37 mins  07/20/2023  AB, OTR/L  Acute Rehabilitation Services  Office: [phone]   Jorene New 07/20/2023, 10:32 AM

## 2023-07-20 NOTE — H&P (Signed)
 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 Per reports falls X 6 in 2 weeks  with lack of feeling in legs and occasional dizziness when standing. She denied  She was sent to ED for evaluation. She was found to have low folate, low vitamin D  and Vitamin A , elevated CRP, low BI <6. She was started on IV thiamine  qid,Vitamin A  IM, IV  folate, B12 injections and Vitamin D  supplement.MRI brain and spine without acute abnormality.  CT chest done due to intermittent tachycardia and she was also found to have multifocal PNA treated with 5 day course of Augmentin . 2 D echo showed EF 55% with moderate to severe TVR and G1DD.    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 Discontinuation of duloxetine  (as can contribute to rhabdo)and muscle biopsy recommended initially by Dr.Stack but patient has had improvement with aggressive vitamin supplementation symptoms felt to be due to nutritional etiology though infectious or inflammatory etiology questioned. Lidocaine  gel added for chronic foot pain and colchicine  d/c.   Pt reports severe painful peripheral neuropathy in her feet and generalized LE>UE weakness.  LBM yesterday Been using Purewick due to inability/difficulty getting OOB.  Hurting in back and legs- aching and meds are helpful.    Review of Systems   Constitutional:  Negative for chills and fever.  HENT:  Negative for hearing loss and tinnitus.   Eyes:  Negative for blurred vision and double vision.  Respiratory:  Negative for cough and shortness of breath.   Cardiovascular:  Negative for chest pain and palpitations.  Gastrointestinal:  Negative for abdominal pain, heartburn and nausea.  Genitourinary:  Negative for dysuria and urgency.  Musculoskeletal:  Positive for back pain and myalgias (left thigh).  Neurological:  Positive for sensory change and weakness. Negative for dizziness and headaches.  Psychiatric/Behavioral:  The patient is nervous/anxious. The patient does not have insomnia.   All other systems reviewed and are negative.    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 Lives with family.  She  reports that she has never smoked. She has never used smokeless tobacco. She reports current alcohol use. She reports that she does not use drugs.   Allergies  Allergen Reactions   Other Itching, Rash and Swelling    Seafood,tomato paste, peanut  butter, peaches, oranges, apples Throat swelling  Dust mite, oak trees, grass- causes rash, itching   Peanut -Containing Drug  Products Anaphylaxis   Shellfish Allergy  Anaphylaxis    Throat swelling    Apple Juice Rash   Orange Fruit [Citrus] Rash   Peach Flavoring Agent (Non-Screening) Rash   Tomato Rash, Hives and Itching   Tree Extract Rash    Medications Prior to Admission  Medication Sig Dispense Refill   acetaminophen  (TYLENOL ) 500 MG tablet Take 2 tablets (1,000 mg total) by mouth every 6 (six) hours as needed for mild pain (pain score 1-3) or moderate pain (pain score 4-6). (Patient taking differently: Take 500-1,000 mg by mouth every 6 (six) hours as needed for mild pain (pain score 1-3) or moderate pain (pain score 4-6).)     acetaZOLAMIDE  (DIAMOX ) 250 MG tablet Take 500 mg by mouth 2 (two) times daily.     albuterol  (PROVENTIL ) (2.5 MG/3ML) 0.083% nebulizer solution Take 3 mLs (2.5 mg total) by nebulization every 6 (six) hours as needed for wheezing or shortness of breath. (Patient taking differently: Take 2.5 mg by nebulization every 4 (four) hours as needed for wheezing or shortness of breath.) 75 mL 1   albuterol  (VENTOLIN  HFA) 108 (90 Base) MCG/ACT inhaler Inhale 2 puffs every 4-6 hours as needed for cough, wheeze, tightness in chest, or shortness of breath 8 g 1   allopurinol  (ZYLOPRIM ) 100 MG tablet Take 1 tablet (100 mg total) by mouth daily. 30 tablet 2   azelastine  (ASTELIN ) 0.1 % nasal spray USE 2 SPRAYS IN EACH NOSTRIL TWICE DAILY AS NEEDED FOR RUNNY NOSE/DRAINAGE DOWN THROAT (Patient taking differently: 2 sprays 2 (two) times daily. USE 2 SPRAYS IN EACH NOSTRIL TWICE DAILY AS NEEDED FOR RUNNY NOSE/DRAINAGE DOWN THROAT) 30 mL 5   bimatoprost (LUMIGAN) 0.01 % SOLN Place 1 drop into both eyes at bedtime.     brimonidine  (ALPHAGAN ) 0.2 % ophthalmic solution Place 1 drop into both eyes 3 (three) times daily.     budesonide -formoterol  (SYMBICORT ) 160-4.5 MCG/ACT inhaler Inhale 2 puffs into the lungs in the morning and at bedtime. (Patient taking differently: Inhale 2 puffs into the lungs at bedtime.) 1  each 5   colchicine  0.6 MG tablet Take 1 tablet (0.6 mg total) by mouth daily. 30 tablet 2   dexlansoprazole  (DEXILANT ) 60 MG capsule Take 1 capsule (60 mg total) by mouth daily. (Patient taking differently: Take 60 mg by mouth at bedtime.) 90 capsule 3   dorzolamide -timolol  (COSOPT ) 2-0.5 % ophthalmic solution Place 1 drop into both eyes 2 (two) times daily.     DULoxetine  (CYMBALTA ) 30 MG capsule Take 1 capsule (30 mg total) by mouth daily. (Patient taking differently: Take 30 mg by mouth at bedtime.) 30 capsule 3   famotidine  (PEPCID ) 40 MG tablet Take 1 tablet (40 mg total) by mouth at bedtime. 90 tablet 3   fluticasone  (FLONASE ) 50 MCG/ACT nasal spray Place 1 spray into both nostrils daily. 16 each 3   gabapentin  (NEURONTIN ) 300 MG capsule Start at 300 p.o. 3 times daily.  If tolerated work up to 600 mg p.o. 3 times daily (Patient taking differently: Take 600 mg by mouth 3 (three) times daily.) 90 capsule 3  GAMUNEX-C 20 GM/200ML SOLN      Heparin  Na, Pork, Lock Flsh PF (BD HEPARIN  POSIFLUSH) 100 UNIT/ML SOLN Inject 100 Units into the vein daily as needed (Given by nurse at home).     loratadine  (CLARITIN ) 10 MG tablet Take 1 tablet (10 mg total) by mouth daily. 30 tablet 11   metoCLOPramide  (REGLAN ) 10 MG tablet Take 1 tablet (10 mg total) by mouth every 8 (eight) hours as needed for nausea. 30 tablet 0   montelukast  (SINGULAIR ) 10 MG tablet Take 1 tablet (10 mg total) by mouth at bedtime. 90 tablet 1   mycophenolate (CELLCEPT) 500 MG tablet Take 500 mg by mouth 2 (two) times daily.     Olmesartan -amLODIPine -HCTZ 20-5-12.5 MG TABS Take 1 tablet by mouth daily. 30 tablet 3   ondansetron  (ZOFRAN ) 8 MG tablet Take 1 tablet (8 mg total) by mouth every 8 (eight) hours as needed for nausea or vomiting. 90 tablet 1   polyethylene glycol powder (GLYCOLAX /MIRALAX ) 17 GM/SCOOP powder Take 17 g by mouth daily. (Patient taking differently: Take 17 g by mouth in the morning and at bedtime.) 500 g 10    RETIN-A  MICRO 0.04 % gel Apply 1 application  topically daily as needed.     RHOPRESSA  0.02 % SOLN Place 1 drop into the right eye at bedtime.     sucralfate  (CARAFATE ) 1 GM/10ML suspension Take 10 mLs (1 g total) by mouth 4 (four) times daily -  with meals and at bedtime. (Patient taking differently: Take 1 g by mouth 2 (two) times daily.) 420 mL 3   vitamin B-12 (VITAMIN B12) 500 MCG tablet Take 1 tablet (500 mcg total) by mouth daily. 28 tablet 0   Adalimumab 40 MG/0.4ML PNKT Inject 40 mg into the skin every 14 (fourteen) days.       Home: Home Living Family/patient expects to be discharged to:: Private residence Living Arrangements: Parent, Other relatives (brother and uncle) Available Help at Discharge: Family, Available 24 hours/day Type of Home: House Home Access: Stairs to enter Secretary/administrator of Steps: 3 Entrance Stairs-Rails: Left Home Layout: One level Bathroom Shower/Tub: Engineer, manufacturing systems: Standard Bathroom Accessibility: Yes Home Equipment: Agricultural consultant (2 wheels), BSC/3in1  Lives With: Family   Functional History: Prior Function Prior Level of Function : Needs assist Physical Assist : ADLs (physical), Mobility (physical) Mobility (physical): Gait, Stairs ADLs (physical): IADLs, Bathing, Dressing Mobility Comments: uses RW for longer distances in the house, otherwise furniture/wall walks, brothers help pt rise into standing until steady then hand her RW ADLs Comments: family helps her in and out of tub to shower seat and helps her get washed up, she dresses herself, A for toileting.  Functional Status:  Mobility: Bed Mobility Overal bed mobility: Needs Assistance Bed Mobility: Rolling, Sidelying to Sit, Sit to Supine Rolling: Supervision Sidelying to sit: Mod assist Supine to sit: Mod assist Sit to supine: Mod assist General bed mobility comments: Pt transitions from supine to moving BLE off EOB with min assist, then in sidelying  position, requires mod assist to upright trunk with HOB fully elevated, bed rails. Transfers Overall transfer level: Needs assistance Equipment used: Ambulation equipment used Transfers: Sit to/from Stand Sit to Stand: +2 physical assistance, Max assist, +2 safety/equipment, Via lift equipment Bed to/from chair/wheelchair/BSC transfer type:: Via Financial planner via Lift Equipment: [employer] transfer comment: STS with max assist from elevated EOB with cuing re: hand placement, counting "1-2-3" to initiate, cuing & encouragement to attempt; pt requires  2 attempts for successful STS from EOB. Pt transfers STS from stedy seat with supervision x 1 attempt, but clears buttocks & continues to lean chest on front bar, unable to shift pelvis anteriorly or upright trunk. Pt with no improvement with upright posture even when PT & mobility specialist provide +2 assist & cuing/demo. Ambulation/Gait General Gait Details: unable to take steps in standing    ADL: ADL Overall ADL's : Needs assistance/impaired Eating/Feeding: Independent, Bed level Grooming: Bed level, Set up Upper Body Bathing: Bed level, Minimal assistance Lower Body Bathing: Maximal assistance, Bed level, Sitting/lateral leans Upper Body Dressing : Bed level, Moderate assistance Lower Body Dressing: Total assistance, Sitting/lateral leans Toilet Transfer Details (indicate cue type and reason): unable to stand on this date, anticipate Max A +2 Toileting- Clothing Manipulation and Hygiene: Maximal assistance, +2 for safety/equipment, +2 for physical assistance General ADL Comments: worked with pt on cervical control, sitting balance, and exercises EOB  Cognition: Cognition Orientation Level: Oriented X4 Cognition Arousal: Alert Behavior During Therapy: Anxious, Lability   Blood pressure (!) 150/100, pulse (!) 116, temperature 97.6 F (36.4 C), temperature source Oral, resp. rate 17, height 5\' 9"  (1.753 m), weight (!)  149 kg, SpO2 97%. Physical Exam Vitals and nursing note reviewed.  Constitutional:      Appearance: Normal appearance. She is obese.     Comments: Woke pt up- supine in bed- mouth full of spit even after she spit it out- very sleepy- NAD; BMI 48  HENT:     Head: Normocephalic and atraumatic.     Comments: Mouth full of saliva- harder to talk    Nose: Nose normal.     Mouth/Throat:     Mouth: Mucous membranes are moist.     Pharynx: Oropharyngeal exudate present.  Eyes:     Extraocular Movements: Extraocular movements intact.  Cardiovascular:     Rate and Rhythm: Regular rhythm. Tachycardia present.     Heart sounds: Normal heart sounds. No murmur heard.    No gallop.  Pulmonary:     Effort: Pulmonary effort is normal. No respiratory distress.     Breath sounds: Normal breath sounds. No wheezing, rhonchi or rales.  Abdominal:     General: Bowel sounds are normal. There is no distension.     Palpations: Abdomen is soft.     Tenderness: There is no abdominal tenderness.  Musculoskeletal:     Cervical back: Neck supple.     Comments: Ue's 5-/5 B/L LE's HF 3-/5 at best B/L; KE/KF 4-/5 B/L; DF and PF 4/5- but limited by pain due to severe pain with light touch  Skin:    General: Skin is warm and dry.     Comments: Port accessed in R chest  Neurological:     Mental Status: She is alert and oriented to person, place, and time.     Comments: C/o pain and neuropathy in feet- very TTP   Psychiatric:     Comments: Sleepy, flat     Results for orders placed or performed during the hospital encounter of 07/14/23 (from the past 48 hours)  Comprehensive metabolic panel     Status: Abnormal   Collection Time: 07/19/23  1:31 AM  Result Value Ref Range   Sodium 132 (L) 135 - 145 mmol/L   Potassium 3.9 3.5 - 5.1 mmol/L   Chloride 102 98 - 111 mmol/L   CO2 23 22 - 32 mmol/L   Glucose, Bld 105 (H) 70 - 99 mg/dL  Comment: Glucose reference range applies only to samples taken after  fasting for at least 8 hours.   BUN 10 6 - 20 mg/dL   Creatinine, Ser 2.95 0.44 - 1.00 mg/dL   Calcium 8.9 8.9 - 62.1 mg/dL   Total Protein 8.8 (H) 6.5 - 8.1 g/dL   Albumin 1.9 (L) 3.5 - 5.0 g/dL   AST 308 (H) 15 - 41 U/L   ALT 86 (H) 0 - 44 U/L   Alkaline Phosphatase 67 38 - 126 U/L   Total Bilirubin 0.6 0.0 - 1.2 mg/dL   GFR, Estimated >65 >78 mL/min    Comment: (NOTE) Calculated using the CKD-EPI Creatinine Equation (2021)    Anion gap 7 5 - 15    Comment: Performed at [facility], [address] [place], [place] [address]  [place]     Status: Abnormal   Collection Time: 07/19/23  1:31 AM  Result Value Ref Range   Total CK 514 (H) 38 - 234 U/L    Comment: Performed at [facility], [address], [place], [place] [address]   No results found.    Blood pressure (!) 150/100, pulse (!) 116, temperature 97.6 F (36.4 C), temperature source Oral, resp. rate 17, height 5\' 9"  (1.753 m), weight (!) 149 kg, SpO2 97%.  Medical Problem List and Plan: 1. Functional deficits secondary to myositis- since was getting better, Neuro declined muscle biopsy  -patient may not shower for the moment- will need to determine with R chest port? If unaccessed?  -ELOS/Goals: min A hopefully  Admit to CIR 2.  Antithrombotics: -DVT/anticoagulation:  Pharmaceutical: Lovenox   -antiplatelet therapy: N/A 3. Pain Management: Has been using oxycodone  prn past 3 days? For foot, leg and back pain- also has Gabapentin  - duloxetine  stopped initially due to myositis- 30 mg resumed 4/23 4. Mood/Behavior/Sleep: LCSW to follow for evaluation and support.   -antipsychotic agents: N/A 5. Neuropsych/cognition: This patient is capable of making decisions on her own behalf. 6. Skin/Wound Care: Routine pressure relief measures.  7. Fluids/Electrolytes/Nutrition: Monitor I/O. Continue Ensure supplements  --check CMET in am 8. Non-specific myositis: Cymbalta  d/c by neurology due to concerns of  rhabdomyolysis --30 mg resumed 04/23 given improving myositis.  --Monitor CK intermittently improved from 1536-->514 --consider muscle biopsy if symptoms do not improve 9. Multifocal PNA: Likely aspiration--most significant in lingula and RLL on CT w/o contrast chest 07/18/23 --Hx of chronic intermittent N/V. May need swallow evaluation. Swallow precautions added.   --started on Augmentin  04/22-->04/25 for 5 day course.  11. Multiple Vitamin deficiencies: Vitamin B1  250 mg daily 4/22-->4/28 followed by 100 mg daily IV.   --now on B12 IM, IV folate. Vitamin A  10,000 units daily. Vitamin D  50,000/wk  --recheck Mg, Phos (supplemented) 12. Resting tachycardia: HR has been 110-120 range at baseline --not on any BB 13. Accelerated HTN: BP poorly controlled. Continue hydrochlorothiazide  and Avapro  -- addition of BB? 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   --IVIG 04/21- 04/24. Followed by Dr. Curley Double. 15. Urinary retention: Purewick being used--monitor voiding with PVR/bladder scan. Toilet every 4 hours 16. Chronic Asthma: Continue Dulera  BID and singulair  --Followed by Dr. Idolina Maker.  17. H/o chronic gastritis w/nausea and vomiting: D/c Ibuprofen . Continue Protonix  40 mg and Pepcid  -- Followed by [facility].  18. Abnormal LFTs: Normal liver parenchyma and GB sludge noted on abdominal ultrasound 07/16/23.  --Resolving. Recheck in am 19. Peripheral polyneuropathy:  Cymbalta  resumed 04/23 as CK trending down. Will check weekly --Gabapentin  600 mg TID 18. Anemia of chronic disease: Likely malabsorption w/ Low iron but elevated TIBC/Ferritin 19. Morbid obesity: BMI 48. Followed by RD/Bariatric nutrition for wt loss. Plan for Bariatric surgery 08/2023.   --Now on aggressive vitamin supplementation.  20. Hyponatremia: Na down to 132-->question due to IVIG 4/31-4/24.  --May see rise in LFTs again.  21. Severe tricuspid regurgitation on  ECHO     Pamela S Love, PA-C 07/20/2023   I have personally performed a face to face diagnostic evaluation of this patient and formulated the key components of the plan.  Additionally, I have personally reviewed laboratory data, imaging studies, as well as relevant notes and concur with the physician assistant's documentation above.   The patient's status has not changed from the original H&P.  Any changes in documentation from the acute care chart have been noted above.

## 2023-07-20 NOTE — Plan of Care (Signed)

## 2023-07-20 NOTE — Discharge Summary (Addendum)
 Family Medicine Teaching [facility] Discharge Summary  Patient name: Tiffany Velasquez Medical record number: [id number] Date of birth: [birth year] Age: 28 y.o. Gender: female Date of Admission: 07/14/2023  Date of Discharge: 07/20/23  Admitting Physician: Omar Bibber, DO  Primary Care Provider: Wilhemena Harbour, MD Consultants: Neurology   Indication for Hospitalization: LE weakness  Discharge Diagnoses/Problem List:  Principal Problem for Admission: LE weakness Other Problems addressed during stay:  Principal Problem:   Lower extremity weakness, bilateral Active Problems:   Morbid obesity (HCC)   Pain in both feet   Generalized weakness  Myositis   Nausea & vomiting   Chronic health problem   Tachycardia  Multifocal PNA   Peanut  allergy    Immunosuppression due to drug therapy (HCC)   Highly Elevated C-reactive protein (CRP)   Folate deficiency   Elevated sedimentation rate measurement   Transaminitis   Elevated CK   Weight loss, 150 lbs in 1 year   Opioid Therapy   Chorioretinal inflammation of both eyes   Anemia   Thiamine  deficiency neuropathy   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)   Unintentional weight loss of 10% body weight within 6 months   Vitamin B12 deficiency   Myositis of left lower extremity   Vitamin D  deficiency   Vitamin A  deficiency   Pneumonia of both lungs due to infectious organism   Polyneuropathy associated with underlying disease [facility])    Brief Hospital Course:  TEOSHA CASSO is a 28 y.o.female with a history of multiple vitamin defiencies, focal chorioretinal inflammation, HTN, neuropathy and gastritis who was admitted to the [facility] Medicine Teaching Service at [facility] for weakness and numbness. Her hospital course is detailed below:  Generalized weakness Myositis  New onset 2 months ago and worsening since. Patient hospitalized for same symptoms from 4/11-4/13/25 - thought at that time to be related to  mycophenolate. All imaging during that admission was negative. Patient saw [facility] on 4/14 who suspected symptoms related to vitamin deficiencies 2/2 poor PO intake - workup then demonstrated low folate, thiamine , B12, as well as abnormal immunoglobulins A & G. On admission, vit b12 wnl, low folate, TSH wnl, RPR nonreactive, elevated ESR/CRP. MRI lumbar and brain unremarkable. MRI cervical and thoracic spine unremarkable.  MRI of the left femur showing signs of myositis possibly secondary to recent fall.  Lab work was significant for elevated CK, which downtrended during admission. Neurology consulted who believe symptoms were most likely due to vitamin deficiency in the setting of severe restriction of diet. Patient was treated with aggressive repletion with B12 injections, IV folate, IV thiamine  and vitamin D  supplementation. Abnormal SPEP results though difficult to interpret in setting of chronic IVIG infusions for chorioretinitis per Neuro. PT evaluated patient and thought she was a candidate for CIR. At discharge antiscleroderma and anticentromere antibodies were in process. Patient will need a rheumatology workup outpatient.   Multifocal PNA Incidental pneumonia findings noted on CT spine. Subsequent CT chest concerning for multifocal pneumonia. Patient remained largely stable on room air, though required supplemental oxygen over one night. Treated with 5 day Augmentin  course. At time of discharge, patient remained stable on room air and had 2 days left of Augmentin  to complete.  Transaminitis Abnormal Liver enzymes  AST and ALT remained stablely elevated during admission with AST/ALT ratio of ~3. GGT also elevated. Patient denies any alcohol use. RUQ US  with gallbladder sludge, otherwise unremarkable.  Will need follow-up outpatient.  Anemia Hgb stable around 8-9 during admission.  Elevated ferritin, suspect inflammatory. Normal iron.   Other chronic conditions were medically managed  with home medications and formulary alternatives as necessary (Chorioretinal inflammation, panuveitis, retinal edema, gout, GERD, Asthma, Neuropathy, HTN)  PCP Follow-up Recommendations: Low Vit D, started on 50,000U q7d for 8 weeks. Adjust to 800 U when appropriate  CT chest wo CM fu 6 months for infiltrates Rheumatology workup outpatient Recheck CMP to trend transaminitis Recheck CBC to trend anemia  Disposition: CIR  Discharge Condition: stable   Discharge Exam:  Vitals:   07/20/23 0806 07/20/23 1636  BP:  128/85  Pulse: (!) 116 (!) 109  Resp: 17   Temp:  98.5 F (36.9 C)  SpO2: 97% 100%   General: No acute distress.  CV: Normal S1/S2. No extra heart sounds. Warm and well-perfused. Pulm: Breathing comfortably on room air. CTAB. No increased WOB. Abd: Soft, non-tender, non-distended. Skin:  Warm, dry. Neuro: Alert and interactive. Stable anisocoria. Facial sensation intact bilaterally.  Tongue midline. Facial expressions symmetric. Turns head against resistance bilaterally. 5/5 upper and lower extremity strength though pain with movement of feet bilaterally. Decreased sensation of lower legs bilaterally.   Exam by Dr. Fernand Howard   Significant Procedures: none  Significant Labs and Imaging:     Latest Ref Rng & Units 07/18/2023    4:12 AM 07/17/2023    5:32 AM 07/16/2023    2:00 AM  CBC  WBC 4.0 - 10.5 K/uL 4.5  7.9  8.8   Hemoglobin 12.0 - 15.0 g/dL 8.5  8.3  8.7   Hematocrit 36.0 - 46.0 % 27.0  26.6  27.7   Platelets 150 - 400 K/uL 202  203  176     Recent Labs  Lab 07/19/23 0131  NA 132*  K 3.9  CL 102  CO2 23  GLUCOSE 105*  BUN 10  CREATININE 0.49  CALCIUM 8.9  ALKPHOS 67  AST 198*  ALT 86*  ALBUMIN 1.9*    MRI lumbar spine without contrast: Minimal disc bulging at L5-S1. No stenosis   MRI brain with and without contrast: Normal MRI appearance of the Brain.   MRI lumbar spine with and without contrast: Stable and essentially normal Lumbar MRI,  without and with contrast.   Ultrasound abdomen limited right upper quadrant: Gallbladder sludge. Otherwise unremarkable examination   MRI femur left without contrast: 1. There is edema within the majority of the muscles of the left buttock and thigh, consistent with the expected nonspecific myositis. This may be from an inflammatory, infectious, drug related, or posttraumatic etiologies. Muscle edema femoral are general myopathy can also be from neurogenic etiology. 2. Edema within muscles innervated by the superior gluteal nerve including gluteus medius and gluteus minimus muscles. However, no significant edema within the tensor fascia lata innervated by the superior gluteal nerve. 3. Edema within muscles innervated by the obturator nerve, including obturator externus, and the left adductor longus, brevis, and magnus. However, no significant edema within the gracilis that is innervated by the anterior division of the obturator nerve. 4. Edema within muscles innervated by the tibial nerve, including the long head of the biceps femoris muscle, semimembranosus, medial and lateral head of the gastrocnemius muscles. However, no significant edema within the semitendinosis innervated by the tibial nerve. 5. Edema within quadriceps muscles innervated by muscular branches of the femoral nerve, including the rectus femoris, vastus medialis, vastus intermedius, and vastus lateralis muscles. However, no significant edema within the sartorius muscle intermediate about the femoral nerve.  MRI thoracic and cervical  spine: 1. Partially imaged bilateral lung opacities, concerning for pneumonia. Recommend chest radiograph or chest CT to further characterize. 2. Otherwise, normal MRI of the cervical and thoracic spine.  CT chest without contrast: 1. Multifocal airspace and patchy ground-glass opacities throughout both lungs most significant in the lingula and right lower lobe compatible with  multifocal pneumonia. 2. Small left pleural effusion.  Results/Tests Pending at Time of Discharge: Antiscleroderma antibody, centromere antibodies, aldosterone with renin activity ratio, vitamin K1 serum, CBC with differential  Discharge Medications:  Allergies as of 07/20/2023       Reactions   Other Itching, Rash, Swelling   Seafood,tomato paste, peanut  butter, peaches, oranges, apples Throat swelling  Dust mite, oak trees, grass- causes rash, itching   Peanut -containing Drug Products Anaphylaxis   Shellfish Allergy  Anaphylaxis   Throat swelling   Apple Juice Rash   Orange Fruit [citrus] Rash   Peach Flavoring Agent (non-screening) Rash   Tomato Rash, Hives, Itching   Tree Extract Rash        Medication List     STOP taking these medications    acetaminophen  500 MG tablet Commonly known as: TYLENOL    mycophenolate 500 MG tablet Commonly known as: CELLCEPT       TAKE these medications    acetaZOLAMIDE  250 MG tablet Commonly known as: DIAMOX  Take 500 mg by mouth 2 (two) times daily.   adalimumab 40 MG/0.4ML pen Commonly known as: HUMIRA Inject 40 mg into the skin every 14 (fourteen) days.   albuterol  108 (90 Base) MCG/ACT inhaler Commonly known as: VENTOLIN  HFA Inhale 2 puffs every 4-6 hours as needed for cough, wheeze, tightness in chest, or shortness of breath What changed: Another medication with the same name was changed. Make sure you understand how and when to take each.   albuterol  (2.5 MG/3ML) 0.083% nebulizer solution Commonly known as: PROVENTIL  Take 3 mLs (2.5 mg total) by nebulization every 6 (six) hours as needed for wheezing or shortness of breath. What changed: when to take this   allopurinol  100 MG tablet Commonly known as: ZYLOPRIM  Take 1 tablet (100 mg total) by mouth daily.   amoxicillin -clavulanate 875-125 MG tablet Commonly known as: AUGMENTIN  Take 1 tablet by mouth every 12 (twelve) hours for 2 days.   azelastine  0.1 % nasal  spray Commonly known as: ASTELIN  USE 2 SPRAYS IN EACH NOSTRIL TWICE DAILY AS NEEDED FOR RUNNY NOSE/DRAINAGE DOWN THROAT What changed:  how much to take when to take this   BD Heparin  PosiFlush 100 UNIT/ML Soln Generic drug: Heparin  Na (Pork) Lock Flsh PF Inject 100 Units into the vein daily as needed (Given by nurse at home).   bimatoprost 0.01 % Soln Commonly known as: LUMIGAN Place 1 drop into both eyes at bedtime.   brimonidine  0.2 % ophthalmic solution Commonly known as: ALPHAGAN  Place 1 drop into both eyes 3 (three) times daily.   budesonide -formoterol  160-4.5 MCG/ACT inhaler Commonly known as: Symbicort  Inhale 2 puffs into the lungs in the morning and at bedtime. What changed: when to take this   colchicine  0.6 MG tablet Take 1 tablet (0.6 mg total) by mouth daily.   cyanocobalamin  500 MCG tablet Commonly known as: VITAMIN B12 Take 1 tablet (500 mcg total) by mouth daily. What changed: Another medication with the same name was added. Make sure you understand how and when to take each.   cyanocobalamin  1000 MCG/ML injection Commonly known as: VITAMIN B12 Inject 1 mL (1,000 mcg total) into the muscle daily. Start taking  on: July 21, 2023 What changed: You were already taking a medication with the same name, and this prescription was added. Make sure you understand how and when to take each.   cyanocobalamin  1000 MCG tablet Take 1 tablet (1,000 mcg total) by mouth daily. Start taking on: July 22, 2023 What changed: You were already taking a medication with the same name, and this prescription was added. Make sure you understand how and when to take each.   dexlansoprazole  60 MG capsule Commonly known as: Dexilant  Take 1 capsule (60 mg total) by mouth daily. What changed: when to take this   dorzolamide -timolol  2-0.5 % ophthalmic solution Commonly known as: COSOPT  Place 1 drop into both eyes 2 (two) times daily.   DULoxetine  30 MG capsule Commonly known as:  Cymbalta  Take 1 capsule (30 mg total) by mouth daily. What changed: when to take this   Ensure Max Protein Liqd Take 330 mLs (11 oz total) by mouth 2 (two) times daily.   famotidine  40 MG tablet Commonly known as: Pepcid  Take 1 tablet (40 mg total) by mouth at bedtime.   fluticasone  50 MCG/ACT nasal spray Commonly known as: FLONASE  Place 1 spray into both nostrils daily.   folic acid  1 MG tablet Commonly known as: FOLVITE  Take 1 tablet (1 mg total) by mouth daily.   gabapentin  300 MG capsule Commonly known as: NEURONTIN  Start at 300 p.o. 3 times daily.  If tolerated work up to 600 mg p.o. 3 times daily What changed:  how much to take how to take this when to take this additional instructions   Gamunex-C 20 GM/200ML Soln Generic drug: Immune Globulin  (Human)   loratadine  10 MG tablet Commonly known as: CLARITIN  Take 1 tablet (10 mg total) by mouth daily.   metoCLOPramide  10 MG tablet Commonly known as: REGLAN  Take 1 tablet (10 mg total) by mouth every 8 (eight) hours as needed for nausea.   montelukast  10 MG tablet Commonly known as: Singulair  Take 1 tablet (10 mg total) by mouth at bedtime.   multivitamin with minerals Tabs tablet Take 1 tablet by mouth daily. Start taking on: July 21, 2023   Olmesartan -amLODIPine -HCTZ 20-5-12.5 MG Tabs Take 1 tablet by mouth daily.   ondansetron  8 MG tablet Commonly known as: ZOFRAN  Take 1 tablet (8 mg total) by mouth every 8 (eight) hours as needed for nausea or vomiting.   polyethylene glycol powder 17 GM/SCOOP powder Commonly known as: GLYCOLAX /MIRALAX  Take 17 g by mouth daily. What changed: when to take this   Retin-A  Micro 0.04 % gel Generic drug: tretinoin  microspheres Apply 1 application  topically daily as needed.   Rhopressa  0.02 % Soln Generic drug: Netarsudil  Dimesylate Place 1 drop into the right eye at bedtime.   sucralfate  1 GM/10ML suspension Commonly known as: Carafate  Take 10 mLs (1 g total) by  mouth 4 (four) times daily -  with meals and at bedtime. What changed: when to take this   thiamine  100 MG/ML injection Commonly known as: VITAMIN B1 Inject 1 mL (100 mg total) into the vein daily. Start taking on: July 24, 2023   vitamin A  3 MG (10000 UNITS) capsule Take 1 capsule (10,000 Units total) by mouth daily. Start taking on: July 21, 2023   Vitamin D  (Ergocalciferol ) 1.25 MG (50000 UNIT) Caps capsule Commonly known as: DRISDOL  Take 1 capsule (50,000 Units total) by mouth every 7 (seven) days. Start taking on: July 22, 2023        Discharge Instructions: Please  refer to Patient Instructions section of EMR for full details.  Patient was counseled important signs and symptoms that should prompt return to medical care, changes in medications, dietary instructions, activity restrictions, and follow up appointments.   Follow-Up Appointments:  Future Appointments  Date Time Provider Department Center  08/01/2023 11:20 AM Lylia Sand, MD [facility] CPR  08/03/2023 10:00 AM Margorie Shelter, RD [facility] NDM  08/04/2023  9:30 AM Daryel Ensign, DO [facility] None  08/04/2023  3:40 PM Armbruster, Lendon Queen, MD [facility]  08/08/2023 11:15 AM Floyce Hutching, DPM [facility]  11/23/2023 11:50 AM [facility] Malvin Searing VISIT [facility]    Clem Currier, DO 07/20/2023, 4:58 PM PGY-2, [facility]  Carey Chapman, MD

## 2023-07-20 NOTE — Progress Notes (Signed)
 Daily Progress Note Intern Pager: [phone]  Patient name: MARGURETE GUAMAN Medical record number: [id number] Date of birth: 30-Mar-1995 Age: 28 y.o. Gender: female  Primary Care Provider: Wilhemena Harbour, MD Consultants: Neurology Code Status: Full  Pt Overview and Major Events to Date:  4/18: Admitted  Assessment and Plan:  LILLI DEWALD 28 y.o. admitted for worsening lower extremity weakness concerning for vitamin deficiency related myelopathy vs other autoimmune inflammatory etiologies with possible resolving myositis. Neurology on board. Pertinent PMH includes neuropathy, vitamin deficiencies, abnormal immunoglobulins, chorioretinal inflammation.  Assessment & Plan Generalized weakness  Myositis Stable to improving neuro exam, with reported improvement of symptoms. Overall LE weakness concerning for known vitamin deficiency (A, D, B1, B9, B12) vs component of autoimmune inflammatory etiology. L femur MRI with signs of myositis, suspect secondary to recent fall. Reassuringly, CK downtrended and patient improving symptomatically. Per neurology, abnormal SPEP results difficult to interpret in setting of chronic IVIG infusions. Vitamin E wnl. Anti-jo negative.  - Neurology following, appreciate recs - Continue B12 injections, IV folate, IV thiamine , MVI, Vit D supplemenation - Continue vitamin A  repletion   - Vit K lab in process - Ald/Renin in process - Anti-scleroderma and centromere antibodies in process - Strep pneumo and legionella urine antigen pending  - Unfortunately unable to obtain inpatient Rheumatology consult. [facility] recommends outpatient referral as indicated.  - PT/OT to treat - recommending CIR at this time, will re-eval as indicated Tachycardia  Multifocal PNA Continues to have intermittent tachycardia. CT chest concerning for multifocal pneumonia.  Echo with EF 55, G1DD, mod-severe tricuspid regurg. 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  - Augmentin  twice  daily (5-day course) Anemia Hg stable. Elevated ferritin, suspect inflammatory. Normal iron. Recheck CBC as needed. Transaminitis AST and ALT have remained stablely elevated during admission with AST/ALT ratio of ~3. GGT also elevated. Patient denies any alcohol use. RUQ US  with gallbladder sludge, otherwise unremarkable.  Nausea & vomiting No emesis since admission. Unable to complete gastric emptying study due to mobility issues. 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 Per pharmacy, patient due for IVIG infusion on 4/19. Will conitnue IVIG infusion per patient's home regimen.  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 (see MAR) Retinal edema: Continue acetazolamide  500 mg twice daily Gout: Continue allopurinol  100 mg daily, holding colchicine  as can contribute to peripheral neuropathy GERD: Continue famotidine  40 mg daily, pantoprazole  40 mg daily (substitute for Dexlansoprazole ) Asthma: Continue Dulera  2 puffs twice daily (substitute for Symbicort ), albuterol  nebulizer PRN, montelukast  10 mg daily Neuropathy: Gabapentin  600 mg TID, Cymbalta  30 daily  HTN: continue irbesartan  150 + hydrochlorothiazide  12.5 daily    FEN/GI: Reg diet PPx: Lovenox  Dispo: Possible CIR pending clinical improvement   Subjective:  Feeling okay this morning. Strength a little better. Improving leg weakness and soreness. No new vision changes. No difficulty breathing or chest pain.   Objective: Temp:  [97.6 F (36.4 C)-98.5 F (36.9 C)] 97.6 F (36.4 C) (04/24 0738) Pulse Rate:  [74-119] 119 (04/24 0738) Resp:  [17-18] 18 (04/24 0537) BP: (128-150)/(79-115) 150/100 (04/24 0738) SpO2:  [93 %-100 %] 100 % (04/24 0738) Physical Exam: General: No acute distress.  CV: Normal S1/S2. No extra heart sounds. Warm and well-perfused. Pulm: Breathing comfortably on room air. CTAB. No increased WOB. Abd: Soft, non-tender,  non-distended. Skin:  Warm, dry. Neuro: Alert and interactive. Stable anisocoria. Facial sensation intact bilaterally.  Tongue midline. Facial  expressions symmetric. Turns head against resistance bilaterally. 5/5 upper and lower extremity strength though pain with movement of feet bilaterally. Decreased sensation of lower legs bilaterally.   Laboratory: Most recent CBC Lab Results  Component Value Date   WBC 4.5 07/18/2023   HGB 8.5 (L) 07/18/2023   HCT 27.0 (L) 07/18/2023   MCV 93.4 07/18/2023   PLT 202 07/18/2023   Most recent BMP    Latest Ref Rng & Units 07/19/2023    1:31 AM  BMP  Glucose 70 - 99 mg/dL 161   BUN 6 - 20 mg/dL 10   Creatinine 0.96 - 1.00 mg/dL 0.45   Sodium 409 - 811 mmol/L 132   Potassium 3.5 - 5.1 mmol/L 3.9   Chloride 98 - 111 mmol/L 102   CO2 22 - 32 mmol/L 23   Calcium 8.9 - 10.3 mg/dL 8.9    Carey Chapman, MD 07/20/2023, 7:58 AM  PGY-1, [facility] FPTS Intern pager: [phone], text pages welcome Secure chat group [facility] Teaching Service

## 2023-07-20 NOTE — Progress Notes (Signed)
 Inpatient Rehabilitation Admissions Coordinator   I have insurance approval and CIR bed to admit to today. I met with patient and spoke with her Mom on phone. They are in agreement. Acute team and TOC made aware. I will make the arrangements.  Jeannetta Millman, RN, MSN Rehab Admissions Coordinator [phone] 07/20/2023 4:15 PM

## 2023-07-20 NOTE — TOC Transition Note (Signed)
 Transition of Care [facility]) - Discharge Note   Patient Details  Name: Tiffany Velasquez MRN: [id number] Date of Birth: 1995-10-29  Transition of Care [facility]) CM/SW Contact:  Tom-Johnson, Angelique Ken, RN Phone Number: 07/20/2023, 3:50 PM   Clinical Narrative:     Patient is scheduled for discharge to CIR today.  Patient will be transported vis bed as in-hospital transfer. No further TOC needs noted.      Final next level of care: IP Rehab Facility Barriers to Discharge: Barriers Resolved   Patient Goals and CMS Choice Patient states their goals for this hospitalization and ongoing recovery are:: To go to rehab abd return home CMS Medicare.gov Compare Post Acute Care list provided to:: Patient Choice offered to / list presented to : Patient, Parent      Discharge Placement                Patient to be transferred to facility by: In-hospital transfer      Discharge Plan and Services Additional resources added to the After Visit Summary for                                       Social Drivers of Health (SDOH) Interventions SDOH Screenings   Food Insecurity: No Food Insecurity (07/15/2023)  Housing: Low Risk  (07/15/2023)  Transportation Needs: No Transportation Needs (07/15/2023)  Utilities: Not At Risk (07/15/2023)  Alcohol Screen: Low Risk  (11/18/2022)  Depression (PHQ2-9): Low Risk  (06/05/2023)  Recent Concern: Depression (PHQ2-9) - High Risk (05/29/2023)  Financial Resource Strain: Low Risk  (05/10/2023)  Physical Activity: Inactive (11/18/2022)  Social Connections: Moderately Integrated (07/08/2023)  Stress: No Stress Concern Present (11/18/2022)  Tobacco Use: Low Risk  (07/14/2023)  Health Literacy: Adequate Health Literacy (11/18/2022)     Readmission Risk Interventions    07/17/2023    9:58 AM  Readmission Risk Prevention Plan  Transportation Screening Complete  Medication Review (RN Care Manager) Referral to Pharmacy  PCP or Specialist appointment  within 3-5 days of discharge Complete  HRI or Home Care Consult Complete  SW Recovery Care/Counseling Consult Complete  Skilled Nursing Facility Not Applicable

## 2023-07-20 NOTE — Assessment & Plan Note (Signed)
 No emesis since admission. Unable to complete gastric emptying study due to mobility issues. - Zofran  prn - Appreciate RD recs

## 2023-07-20 NOTE — Progress Notes (Signed)
 Rawland Caddy, MD  Physician Physical Medicine and Rehabilitation   Consult Note    Signed   Date of Service: 07/17/2023  1:34 PM  Related encounter: ED to Hosp-Admission (Current) from 07/14/2023 in [facility]   Signed     Expand All Collapse All  Show:Clear all [x] Written[x] Templated[] Copied  Added by: [x] Rawland Caddy, MD  [] Hover for details          Physical Medicine and Rehabilitation Consult Reason for Consult:impaired functional mobility Referring Physician: McDiarmid     HPI: Tiffany Velasquez is a 28 y.o. female with a history of multiple vitamin deficiencies, abnormal immunoglobulins, focal chorioretinal inflammation, htn, neuropathy who was admitted on 07/15/23 with progressive weakness, sensory loss, and pupillary defect. Pt was started on IVIG infusions for focal chroioretinal inflammation. She was found to have thiamine  and B12 deficiencies which are being treated aggressively, in addition to elevation of multiple non-specific inflammatory markers including CK. Thighs have been tender.. Abnormal SPEP difficult to interpret with IVIG infusions ongoing. The plan at present is to hold off on other serological antibody testing at this time. Anti-Jo1 antibody is pending however. MRI of head, cervical, thoracic, and lumbar spine were negative while scan of left thigh was notable for diffuse myositis.  With PT pt has been mod assist for sit-std transfers using Stedy. She has not stood for any period of time nor has she ambulated. Pt lives in a one level home with 4 steps to enter with her family. Pt was using a RW/furniture walking prior to this admit.      Review of Systems  Constitutional:  Positive for malaise/fatigue.  HENT: Negative.    Eyes:  Positive for blurred vision.  Respiratory: Negative.    Cardiovascular: Negative.   Gastrointestinal:  Positive for nausea and vomiting.  Genitourinary:        Urine retention   Musculoskeletal:  Positive for back pain, falls and myalgias.  Skin: Negative.   Neurological:  Positive for dizziness, sensory change, focal weakness and weakness.  Psychiatric/Behavioral:  The patient does not have insomnia.         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             Past Surgical History:  Procedure Laterality Date   BIOPSY   04/04/2023    Procedure: BIOPSY;  Surgeon: Junie Olds, MD;  Location: [facility];  Service: General;;   CATARACT EXTRACTION       ESOPHAGOGASTRODUODENOSCOPY N/A 04/04/2023    Procedure: ESOPHAGOGASTRODUODENOSCOPY (EGD);  Surgeon: Junie Olds, MD;  Location: [facility];  Service: General;  Laterality: N/A;   PORTA CATH INSERTION                 Family History  Problem Relation Age of Onset   Asthma Mother     Allergic rhinitis Father     Sudden death Cousin     Eczema Neg Hx     Urticaria Neg Hx          Social History:  reports that she has never smoked. She has never used smokeless tobacco. She reports current alcohol use. She reports that she does not use drugs. Allergies:  Allergies  Allergies  Allergen Reactions   Other Itching, Rash and Swelling      Seafood,tomato paste, peanut  butter, peaches, oranges, apples Throat swelling  Dust mite, oak trees, grass- causes rash, itching   Peanut -Containing Drug Products Anaphylaxis   Shellfish Allergy  Anaphylaxis      Throat swelling     Apple Juice Rash   Orange Fruit [Citrus] Rash   Peach Flavoring Agent (Non-Screening) Rash   Tomato Rash, Hives and Itching   Tree Extract Rash            Medications Prior to Admission  Medication Sig Dispense Refill   acetaminophen  (TYLENOL ) 500 MG tablet Take 2 tablets (1,000 mg total) by mouth every 6 (six) hours as needed for mild pain  (pain score 1-3) or moderate pain (pain score 4-6). (Patient taking differently: Take 500-1,000 mg by mouth every 6 (six) hours as needed for mild pain (pain score 1-3) or moderate pain (pain score 4-6).)       acetaZOLAMIDE  (DIAMOX ) 250 MG tablet Take 500 mg by mouth 2 (two) times daily.       albuterol  (PROVENTIL ) (2.5 MG/3ML) 0.083% nebulizer solution Take 3 mLs (2.5 mg total) by nebulization every 6 (six) hours as needed for wheezing or shortness of breath. (Patient taking differently: Take 2.5 mg by nebulization every 4 (four) hours as needed for wheezing or shortness of breath.) 75 mL 1   albuterol  (VENTOLIN  HFA) 108 (90 Base) MCG/ACT inhaler Inhale 2 puffs every 4-6 hours as needed for cough, wheeze, tightness in chest, or shortness of breath 8 g 1   allopurinol  (ZYLOPRIM ) 100 MG tablet Take 1 tablet (100 mg total) by mouth daily. 30 tablet 2   azelastine  (ASTELIN ) 0.1 % nasal spray USE 2 SPRAYS IN EACH NOSTRIL TWICE DAILY AS NEEDED FOR RUNNY NOSE/DRAINAGE DOWN THROAT (Patient taking differently: 2 sprays 2 (two) times daily. USE 2 SPRAYS IN EACH NOSTRIL TWICE DAILY AS NEEDED FOR RUNNY NOSE/DRAINAGE DOWN THROAT) 30 mL 5   bimatoprost (LUMIGAN) 0.01 % SOLN Place 1 drop into both eyes at bedtime.       brimonidine  (ALPHAGAN ) 0.2 % ophthalmic solution Place 1 drop into both eyes 3 (three) times daily.       budesonide -formoterol  (SYMBICORT ) 160-4.5 MCG/ACT inhaler Inhale 2 puffs into the lungs in the morning and at bedtime. (Patient taking differently: Inhale 2 puffs into the lungs at bedtime.) 1 each 5   colchicine  0.6 MG tablet Take 1 tablet (0.6 mg total) by mouth daily. 30 tablet 2   dexlansoprazole  (DEXILANT ) 60 MG capsule Take 1 capsule (60 mg total) by mouth daily. (Patient taking differently: Take 60 mg by mouth at bedtime.) 90 capsule 3   dorzolamide -timolol  (COSOPT ) 2-0.5 % ophthalmic solution Place 1 drop into both eyes 2 (two) times daily.       DULoxetine  (CYMBALTA ) 30 MG capsule Take 1  capsule (30 mg total) by mouth daily. (Patient taking differently: Take 30 mg by mouth at bedtime.) 30 capsule 3   famotidine  (PEPCID ) 40 MG tablet Take 1 tablet (40 mg total) by mouth at bedtime. 90 tablet 3   fluticasone  (FLONASE ) 50 MCG/ACT nasal spray Place 1 spray into both nostrils daily. 16 each 3   gabapentin  (NEURONTIN ) 300 MG capsule Start at 300 p.o. 3 times daily.  If tolerated work up to 600 mg p.o. 3 times daily (Patient taking differently: Take 600 mg by mouth 3 (three) times daily.) 90 capsule 3   GAMUNEX-C 20 GM/200ML SOLN  Heparin  Na, Pork, Lock Flsh PF (BD HEPARIN  POSIFLUSH) 100 UNIT/ML SOLN Inject 100 Units into the vein daily as needed (Given by nurse at home).       loratadine  (CLARITIN ) 10 MG tablet Take 1 tablet (10 mg total) by mouth daily. 30 tablet 11   metoCLOPramide  (REGLAN ) 10 MG tablet Take 1 tablet (10 mg total) by mouth every 8 (eight) hours as needed for nausea. 30 tablet 0   montelukast  (SINGULAIR ) 10 MG tablet Take 1 tablet (10 mg total) by mouth at bedtime. 90 tablet 1   mycophenolate (CELLCEPT) 500 MG tablet Take 500 mg by mouth 2 (two) times daily.       Olmesartan -amLODIPine -HCTZ 20-5-12.5 MG TABS Take 1 tablet by mouth daily. 30 tablet 3   ondansetron  (ZOFRAN ) 8 MG tablet Take 1 tablet (8 mg total) by mouth every 8 (eight) hours as needed for nausea or vomiting. 90 tablet 1   polyethylene glycol powder (GLYCOLAX /MIRALAX ) 17 GM/SCOOP powder Take 17 g by mouth daily. (Patient taking differently: Take 17 g by mouth in the morning and at bedtime.) 500 g 10   RETIN-A  MICRO 0.04 % gel Apply 1 application  topically daily as needed.       RHOPRESSA  0.02 % SOLN Place 1 drop into the right eye at bedtime.       sucralfate  (CARAFATE ) 1 GM/10ML suspension Take 10 mLs (1 g total) by mouth 4 (four) times daily -  with meals and at bedtime. (Patient taking differently: Take 1 g by mouth 2 (two) times daily.) 420 mL 3   vitamin B-12 (VITAMIN B12) 500 MCG tablet Take 1  tablet (500 mcg total) by mouth daily. 28 tablet 0   Adalimumab 40 MG/0.4ML PNKT Inject 40 mg into the skin every 14 (fourteen) days.              Home: Home Living Family/patient expects to be discharged to:: Private residence Living Arrangements: Parent, Other relatives (brother and uncle) Available Help at Discharge: Family, Available 24 hours/day Type of Home: House Home Access: Stairs to enter Secretary/administrator of Steps: 3 Entrance Stairs-Rails: Left Home Layout: One level Bathroom Shower/Tub: Engineer, manufacturing systems: Standard Bathroom Accessibility: Yes Home Equipment: Agricultural consultant (2 wheels), BSC/3in1  Lives With: Family  Functional History: Prior Function Prior Level of Function : Needs assist Physical Assist : ADLs (physical), Mobility (physical) Mobility (physical): Gait, Stairs ADLs (physical): IADLs, Bathing, Dressing Mobility Comments: uses RW for longer distances in the house, otherwise furniture/wall walks, brothers help pt rise into standing until steady then hand her RW ADLs Comments: family helps her in and out of tub to shower seat and helps her get washed up, she dresses herself, A for toileting. Functional Status:  Mobility: Bed Mobility Overal bed mobility: Needs Assistance Bed Mobility: Rolling, Sidelying to Sit, Sit to Supine Rolling: Supervision Sidelying to sit: Mod assist Supine to sit: Mod assist Sit to supine: Mod assist General bed mobility comments: Mod A to raise trunk, pt able to roll into sidelying without physical assist. Transfers Overall transfer level: Needs assistance Equipment used: Ambulation equipment used Transfers: Sit to/from Stand, Bed to chair/wheelchair/BSC Sit to Stand: +2 physical assistance, Max assist, Total assist Bed to/from chair/wheelchair/BSC transfer type:: Via Lift equipment Transfer via Lift Equipment: Veronica Gordon General transfer comment: 3 attempts to stand from [facility] to stedy with 2 person  assist; unable to rise; Used Maximove lift for safe transfer from [facility] back to bed   ADL: ADL Overall ADL's :  Needs assistance/impaired Eating/Feeding: Independent, Bed level Grooming: Bed level, Set up Upper Body Bathing: Bed level, Minimal assistance Lower Body Bathing: Maximal assistance, Bed level, Sitting/lateral leans Upper Body Dressing : Bed level, Moderate assistance Lower Body Dressing: Total assistance, Sitting/lateral leans Toilet Transfer Details (indicate cue type and reason): unable to stand on this date, anticipate Max A +2 Toileting- Clothing Manipulation and Hygiene: Maximal assistance, +2 for safety/equipment, +2 for physical assistance   Cognition: Cognition Orientation Level: Oriented X4 Cognition Arousal: Alert Behavior During Therapy: WFL for tasks assessed/performed   Blood pressure 127/85, pulse (!) 109, temperature 98.4 F (36.9 C), temperature source Oral, resp. rate 19, height 5\' 9"  (1.753 m), weight (!) 148.4 kg, SpO2 98%. Physical Exam Constitutional:      General: She is not in acute distress.    Appearance: She is obese.  HENT:     Head: Normocephalic and atraumatic.     Right Ear: External ear normal.     Left Ear: External ear normal.     Mouth/Throat:     Mouth: Mucous membranes are moist.  Eyes:     Extraocular Movements: Extraocular movements intact.     Conjunctiva/sclera: Conjunctivae normal.  Cardiovascular:     Rate and Rhythm: Tachycardia present.  Pulmonary:     Effort: Pulmonary effort is normal.  Abdominal:     Palpations: Abdomen is soft.  Musculoskeletal:        General: Tenderness (both thighs with palpation, ROM) present. Normal range of motion.     Cervical back: Normal range of motion.  Skin:    General: Skin is warm.  Neurological:     Comments: Pt is alert and oriented. Normal language and speech. Functional memory, insight and awareness. Poor visual acuity. Able to see colors and shapes from about 10' away but not  specific letters or numbers. MMT: BUE 4/5 deltoids but 4+ to 5/5 biceps, triceps, wrist and hands. BLE: 4/5 HF, KE and 4+/5 ADF/PF. Patchy sensory changes in both lower legs but not specifically the feet. DTR's 2+ UE and tr in LE's. No abnl resting tone.   Psychiatric:        Mood and Affect: Mood normal.        Behavior: Behavior normal.        Lab Results Last 24 Hours       Results for orders placed or performed during the hospital encounter of 07/14/23 (from the past 24 hours)  Iron and TIBC     Status: Abnormal    Collection Time: 07/16/23  3:47 PM  Result Value Ref Range    Iron 46 28 - 170 ug/dL    TIBC 469 (L) 629 - 528 ug/dL    Saturation Ratios 27 10.4 - 31.8 %    UIBC 126 ug/dL  Protime-INR     Status: Abnormal    Collection Time: 07/16/23  9:01 PM  Result Value Ref Range    Prothrombin Time 15.3 (H) 11.4 - 15.2 seconds    INR 1.2 0.8 - 1.2  CK     Status: Abnormal    Collection Time: 07/17/23  5:32 AM  Result Value Ref Range    Total CK 1,250 (H) 38 - 234 U/L  CBC     Status: Abnormal    Collection Time: 07/17/23  5:32 AM  Result Value Ref Range    WBC 7.9 4.0 - 10.5 K/uL    RBC 2.85 (L) 3.87 - 5.11 MIL/uL    Hemoglobin 8.3 (L)  12.0 - 15.0 g/dL    HCT 11.9 (L) 14.7 - 46.0 %    MCV 93.3 80.0 - 100.0 fL    MCH 29.1 26.0 - 34.0 pg    MCHC 31.2 30.0 - 36.0 g/dL    RDW 82.9 (H) 56.2 - 15.5 %    Platelets 203 150 - 400 K/uL    nRBC 0.3 (H) 0.0 - 0.2 %  Comprehensive metabolic panel     Status: Abnormal    Collection Time: 07/17/23  5:32 AM  Result Value Ref Range    Sodium 135 135 - 145 mmol/L    Potassium 3.5 3.5 - 5.1 mmol/L    Chloride 100 98 - 111 mmol/L    CO2 25 22 - 32 mmol/L    Glucose, Bld 89 70 - 99 mg/dL    BUN 7 6 - 20 mg/dL    Creatinine, Ser 1.30 0.44 - 1.00 mg/dL    Calcium  8.8 (L) 8.9 - 10.3 mg/dL    Total Protein 6.4 (L) 6.5 - 8.1 g/dL    Albumin 2.2 (L) 3.5 - 5.0 g/dL    AST 865 (H) 15 - 41 U/L    ALT 87 (H) 0 - 44 U/L    Alkaline  Phosphatase 61 38 - 126 U/L    Total Bilirubin 0.8 0.0 - 1.2 mg/dL    GFR, Estimated >78 >46 mL/min    Anion gap 10 5 - 15  Magnesium      Status: Abnormal    Collection Time: 07/17/23  5:32 AM  Result Value Ref Range    Magnesium  1.6 (L) 1.7 - 2.4 mg/dL  Ferritin     Status: Abnormal    Collection Time: 07/17/23  5:32 AM  Result Value Ref Range    Ferritin 791 (H) 11 - 307 ng/mL  Retic Panel     Status: Abnormal    Collection Time: 07/17/23  5:32 AM  Result Value Ref Range    Retic Ct Pct 0.5 0.4 - 3.1 %    RBC. 2.82 (L) 3.87 - 5.11 MIL/uL    Retic Count, Absolute 13.5 (L) 19.0 - 186.0 K/uL    Immature Retic Fract 8.1 2.3 - 15.9 %    Reticulocyte Hemoglobin 27.7 (L) >27.9 pg  Phosphorus     Status: None    Collection Time: 07/17/23  5:32 AM  Result Value Ref Range    Phosphorus 2.7 2.5 - 4.6 mg/dL       Imaging Results (Last 48 hours)  MR THORACIC SPINE W WO CONTRAST Result Date: 07/17/2023 CLINICAL DATA:  CSF leak/Spontaneous intracranial hypotension suspected; CSF leak suspected/ Spontaneous intracranial hypotension EXAM: MRI CERVICAL AND THORACIC SPINE WITHOUT AND WITH CONTRAST TECHNIQUE: Multiplanar and multiecho pulse sequences of the cervical spine, to include the craniocervical junction and cervicothoracic junction, and the thoracic spine, were obtained without and with intravenous contrast. CONTRAST:  10mL GADAVIST  GADOBUTROL  1 MMOL/ML IV SOLN COMPARISON:  None Available. FINDINGS: MRI CERVICAL SPINE FINDINGS Alignment: Normal. Vertebrae: No fracture, evidence of discitis, or bone lesion. Cord: Normal cord signal.  No abnormal enhancement. Posterior Fossa, vertebral arteries, paraspinal tissues: Visualized vertebral artery flow voids are maintained no paraspinal edema. Partially imaged bilateral lung opacities. Disc levels: No significant canal or foraminal stenosis. No disc bulges. No visible dural outpouchings or dural defect. Normal CSF flow is noted on cines. MRI THORACIC  SPINE FINDINGS Alignment: No substantial sagittal subluxation. Vertebrae: No fracture, evidence of discitis, or bone lesion. Cord: Normal cord signal. Paraspinal  and other soft tissues: No paraspinal edema. Partially imaged bilateral lung opacities. Disc levels: No significant canal or foraminal stenosis. No disc bulges. No visible dural outpouchings or dural defect. IMPRESSION: 1. Partially imaged bilateral lung opacities, concerning for pneumonia. Recommend chest radiograph or chest CT to further characterize. 2. Otherwise, normal MRI of the cervical and thoracic spine. These results will be called to the ordering clinician or representative by the Radiologist Assistant, and communication documented in the PACS or [employer]. Electronically Signed   By: Stevenson Elbe M.D.   On: 07/17/2023 00:15    MR CERVICAL SPINE W WO CONTRAST Result Date: 07/17/2023 CLINICAL DATA:  CSF leak/Spontaneous intracranial hypotension suspected; CSF leak suspected/ Spontaneous intracranial hypotension EXAM: MRI CERVICAL AND THORACIC SPINE WITHOUT AND WITH CONTRAST TECHNIQUE: Multiplanar and multiecho pulse sequences of the cervical spine, to include the craniocervical junction and cervicothoracic junction, and the thoracic spine, were obtained without and with intravenous contrast. CONTRAST:  10mL GADAVIST  GADOBUTROL  1 MMOL/ML IV SOLN COMPARISON:  None Available. FINDINGS: MRI CERVICAL SPINE FINDINGS Alignment: Normal. Vertebrae: No fracture, evidence of discitis, or bone lesion. Cord: Normal cord signal.  No abnormal enhancement. Posterior Fossa, vertebral arteries, paraspinal tissues: Visualized vertebral artery flow voids are maintained no paraspinal edema. Partially imaged bilateral lung opacities. Disc levels: No significant canal or foraminal stenosis. No disc bulges. No visible dural outpouchings or dural defect. Normal CSF flow is noted on cines. MRI THORACIC SPINE FINDINGS Alignment: No substantial sagittal  subluxation. Vertebrae: No fracture, evidence of discitis, or bone lesion. Cord: Normal cord signal. Paraspinal and other soft tissues: No paraspinal edema. Partially imaged bilateral lung opacities. Disc levels: No significant canal or foraminal stenosis. No disc bulges. No visible dural outpouchings or dural defect. IMPRESSION: 1. Partially imaged bilateral lung opacities, concerning for pneumonia. Recommend chest radiograph or chest CT to further characterize. 2. Otherwise, normal MRI of the cervical and thoracic spine. These results will be called to the ordering clinician or representative by the Radiologist Assistant, and communication documented in the PACS or [employer]. Electronically Signed   By: Stevenson Elbe M.D.   On: 07/17/2023 00:15    MR FEMUR LEFT WO CONTRAST Result Date: 07/16/2023 CLINICAL DATA:  Myositis. EXAM: MR OF THE LEFT FEMUR WITHOUT CONTRAST TECHNIQUE: Multiplanar, multisequence MR imaging of the left femur was performed. No intravenous contrast was administered. COMPARISON:  None Available. FINDINGS: Bones/Joint/Cartilage Normal bone mineralization. No cortical erosion. The patellar apex is approximately 7 mm lateralized with respect to the trochlear notch. Ligaments No ligament tear is seen. Muscles and Tendons There is mild edema within the mid to lateral aspect of the left gluteus medius muscle and minimal edema within the proximal anterior aspect of the left gluteus minimus muscle (both innervated by the superior gluteal nerve) . There is confluent fluid measuring up to approximately 11 mm in transverse thickness deep to the iliotibial band and lateral to the gluteus medius muscle (axial series 11 images 15 through 23). There is also mild edema within the bilateral obturator externus, and left adductor longus, brevis, and magnus muscles (all innervated by the obturator nerve). Moderate edema within the long head of the biceps femoris muscle (tibial nerve) and mild edema  within the semimembranosus muscle (tibial nerve). Mild edema within the partially visualized soleus and medial and lateral heads of the gastrocnemius muscles (all innervated by the tibial nerve). Very mild edema within the rectus femoris, vastus medialis, vastus intermedius, and vastus lateralis muscles (all innervated by the muscular branches  of the femoral nerve). No significant edema within the tensor fascia lata (superior gluteal nerve), gluteus maximus (inferior gluteal nerve), sartorius (femoral nerve), gracilis (anterior division of the obturator nerve), short head of the biceps femoris (common peroneal nerve), or semitendinosis (tibial nerve) muscles. Soft tissues There is mild edema within the deep subcutaneous fat along the lateral border of the vastus lateralis muscle along an approximate 20 cm length of the mid to distal thigh (axial series 11 images 32 through 56 of 56 and axial series 11 images 1 through 17 of 40). IMPRESSION: 1. There is edema within the majority of the muscles of the left buttock and thigh, consistent with the expected nonspecific myositis. This may be from an inflammatory, infectious, drug related, or posttraumatic etiologies. Muscle edema femoral are general myopathy can also be from neurogenic etiology. 2. Edema within muscles innervated by the superior gluteal nerve including gluteus medius and gluteus minimus muscles. However, no significant edema within the tensor fascia lata innervated by the superior gluteal nerve. 3. Edema within muscles innervated by the obturator nerve, including obturator externus, and the left adductor longus, brevis, and magnus. However, no significant edema within the gracilis that is innervated by the anterior division of the obturator nerve. 4. Edema within muscles innervated by the tibial nerve, including the long head of the biceps femoris muscle, semimembranosus, medial and lateral head of the gastrocnemius muscles. However, no significant edema  within the semitendinosis innervated by the tibial nerve. 5. Edema within quadriceps muscles innervated by muscular branches of the femoral nerve, including the rectus femoris, vastus medialis, vastus intermedius, and vastus lateralis muscles. However, no significant edema within the sartorius muscle intermediate about the femoral nerve. Electronically Signed   By: Bertina Broccoli M.D.   On: 07/16/2023 19:13    US  Abdomen Limited RUQ (LIVER/GB) Result Date: 07/16/2023 CLINICAL DATA:  [id number] LFT elevation [id number] EXAM: ULTRASOUND ABDOMEN LIMITED COMPARISON:  None Available. FINDINGS: The liver demonstrates normal parenchymal echogenicity and homogeneous texture without focal hepatic parenchymal lesions or intrahepatic ductal dilatation. Layering sludge in the gallbladder. There are no stones, wall thickening or pericholecystic fluid. CBD measured 0.1cm. IMPRESSION: Gallbladder sludge. Otherwise unremarkable examination Electronically Signed   By: Sydell Eva M.D.   On: 07/16/2023 16:28       Assessment/Plan: Diagnosis: 28 yo female with what appears to be some type of inflammatory myositis in the setting of multiple vitamin deficiencies, abnormal immunoglobulins, and focal chorioretinal inflammation. The result has been impaired functional mobility due pain, sensory loss, muscle weakness, etc.  Does the need for close, 24 hr/day medical supervision in concert with the patient's rehab needs make it unreasonable for this patient to be served in a less intensive setting? Yes Co-Morbidities requiring supervision/potential complications:  -ongoing autoimmune/inflammatory work up -sinus tachycardia -nausea and vomiting -anemia -urine retention -chronic neuropathic pain/polyneuropathy -nutrition/vitamin deficiency -transaminitis Due to bladder management, bowel management, safety, skin/wound care, disease management, medication administration, pain management, and patient education, does the patient  require 24 hr/day rehab nursing? Yes Does the patient require coordinated care of a physician, rehab nurse, therapy disciplines of PT, OT to address physical and functional deficits in the context of the above medical diagnosis(es)? Yes Addressing deficits in the following areas: balance, endurance, locomotion, strength, transferring, bowel/bladder control, bathing, dressing, feeding, grooming, toileting, and psychosocial support Can the patient actively participate in an intensive therapy program of at least 3 hrs of therapy per day at least 5 days per week? Yes The potential for patient to  make measurable gains while on inpatient rehab is excellent Anticipated functional outcomes upon discharge from inpatient rehab are supervision  with PT, supervision with OT, n/a with SLP. Estimated rehab length of stay to reach the above functional goals is: 13-20 days Anticipated discharge destination: Home Overall Rehab/Functional Prognosis: excellent   POST ACUTE RECOMMENDATIONS: This patient's condition is appropriate for continued rehabilitative care in the following setting: CIR Patient has agreed to participate in recommended program. Yes Note that insurance prior authorization may be required for reimbursement for recommended care.   Comment: Pt was independent with cane/rolling walker prior to admit. She is very motivated to regain functional mobility. Has supportive family. It sounds as if she took steps today with PT, but a progress report has not yet been filed. Rehab Admissions Coordinator to follow up.         I have personally performed a face to face diagnostic evaluation of this patient. Additionally, I have examined the patient's medical record including any pertinent labs and radiographic images.     Thanks,   Rawland Caddy, MD 07/17/2023          Routing History

## 2023-07-20 NOTE — Progress Notes (Signed)
 Pt arrived on unit via bariatric bed with nurse from 5w at 09:30pm

## 2023-07-20 NOTE — Progress Notes (Signed)
 Physical Therapy Treatment Patient Details Name: Tiffany Velasquez MRN: [id number] DOB: Aug 27, 1995 Today's Date: 07/20/2023   History of Present Illness Pt is 28 y.o. female presenting 07/14/23 with 2 months of progressive weakness. Pt has fallen 6 times in the past 2 weeks. She was recently admitted 4/11 for generalized weakness as well; found to have significant nutritional deficiencies (including B12) in the setting of an aggressive weight loss program;  PMH asthma, glaucoma, panuveitis, HTN, anemia and peripheral neuropathy, obesity.    PT Comments  Pt received in bed, very childlike beginning of session but motivated once moving. Pt shows improvement today in ability to keep head in midline in sitting for longer periods and ability to stand to RW 2x with mod A +2. However, after this effort pt was unable to stand to stedy for pivot to chair even with max A +2. Worked on this multiple times in partial range. Lift pad left in recliner for maximove to chair later in day. Worked on pt assisting with bed mobility and changing of linens and gown due to being soiled of urine. Patient will benefit from intensive inpatient follow-up therapy, >3 hours/day. PT will continue to follow.     If plan is discharge home, recommend the following: Two people to help with walking and/or transfers;Two people to help with bathing/dressing/bathroom   Can travel by private vehicle        Equipment Recommendations  Rolling walker (2 wheels);Wheelchair (measurements PT);Wheelchair cushion (measurements PT);Other (comment);Hoyer lift;Hospital bed (drop arm BSC, bariatric equipment)    Recommendations for Other Services Rehab consult     Precautions / Restrictions Precautions Precautions: Fall Recall of Precautions/Restrictions: Intact Precaution/Restrictions Comments: 6 falls recently Restrictions Weight Bearing Restrictions Per Provider Order: No     Mobility  Bed Mobility Overal bed mobility: Needs  Assistance Bed Mobility: Rolling, Sidelying to Sit, Sit to Supine Rolling: Supervision Sidelying to sit: Mod assist   Sit to supine: Mod assist, +2 for physical assistance   General bed mobility comments: Pt transitions from supine to moving BLE off EOB with min assist, then in sidelying position, requires mod assist to upright trunk. Mod A +2 to LE's for return to supine. Bed placed in [place] and had pt participate with rolling and scooting up using bed rails and HOB with knees bridged to pull/ push to [facility]    Transfers Overall transfer level: Needs assistance Equipment used: Ambulation equipment used, Rolling walker (2 wheels) Transfers: Sit to/from Stand Sit to Stand: +2 physical assistance, Max assist, +2 safety/equipment, Mod assist, Via lift equipment           General transfer comment: first used RW and pt sit>stand 2x with mod A +2. Immediate return to sitting first time, maintained standing 5 secs second time with full clearance of buttocks. After this, attempted standing in stedy multiple times for pivot to recliner but pt unable to clear buttocks again, max A +2 given Transfer via Lift Equipment: Stedy  Ambulation/Gait               General Gait Details: unable   Stairs             Wheelchair Mobility     Tilt Bed    Modified Rankin (Stroke Patients Only)       Balance Overall balance assessment: Needs assistance Sitting-balance support: No upper extremity supported, Feet supported Sitting balance-Leahy Scale: Fair Sitting balance - Comments: pt holding head up for longer periods of time in sitting today. Pt  able to lean down with elbows on knees and return to sitting.   Standing balance support: Bilateral upper extremity supported, Reliant on assistive device for balance Standing balance-Leahy Scale: Zero Standing balance comment: BUE support on stedy/ RW                            Communication Communication Communication:  No apparent difficulties  Cognition Arousal: Alert Behavior During Therapy: Anxious   PT - Cognitive impairments: Initiation                       PT - Cognition Comments: pt very childlke, question her baseline. Gives good initial effort but has trouble remaining motivated when she fatigues. Giving better effort today though than session 4/22 Following commands: Impaired Following commands impaired: Follows one step commands with increased time, Follows one step commands inconsistently    Cueing Cueing Techniques: Verbal cues, Tactile cues, Gestural cues, Visual cues  Exercises      General Comments General comments (skin integrity, edema, etc.): bed linens changed along with gown as purewick not fully catching urine      Pertinent Vitals/Pain Pain Assessment Pain Assessment: Faces Faces Pain Scale: Hurts even more Pain Location: (chronic) neuropathic pain in BLE feet Pain Descriptors / Indicators: Discomfort, Burning Pain Intervention(s): Limited activity within patient's tolerance, Monitored during session    Home Living                          Prior Function            PT Goals (current goals can now be found in the care plan section) Acute Rehab PT Goals Patient Stated Goal: to walk PT Goal Formulation: With patient Time For Goal Achievement: 07/30/23 Potential to Achieve Goals: Fair Progress towards PT goals: Progressing toward goals    Frequency    Min 2X/week      PT Plan      Co-evaluation              AM-PAC PT "6 Clicks" Mobility   Outcome Measure  Help needed turning from your back to your side while in a flat bed without using bedrails?: None Help needed moving from lying on your back to sitting on the side of a flat bed without using bedrails?: A Lot Help needed moving to and from a bed to a chair (including a wheelchair)?: Total Help needed standing up from a chair using your arms (e.g., wheelchair or bedside  chair)?: Total Help needed to walk in hospital room?: Total Help needed climbing 3-5 steps with a railing? : Total 6 Click Score: 10    End of Session Equipment Utilized During Treatment: Gait belt Activity Tolerance: Patient limited by fatigue Patient left: with call bell/phone within reach;in bed Nurse Communication: Mobility status;Need for lift equipment (lift pad in chair for maximove to chair with nsg) PT Visit Diagnosis: Unsteadiness on feet (R26.81);Other abnormalities of gait and mobility (R26.89);Repeated falls (R29.6);Muscle weakness (generalized) (M62.81);History of falling (Z91.81);Difficulty in walking, not elsewhere classified (R26.2);Other symptoms and signs involving the nervous system (R29.898) Pain - Right/Left:  (bil) Pain - part of body: Ankle and joints of foot;Leg     Time: 8295-6213 PT Time Calculation (min) (ACUTE ONLY): 49 min  Charges:    $Therapeutic Activity: 8-22 mins PT General Charges $$ ACUTE PT VISIT: 1 Visit  Amey Ka, PT  Acute Rehab Services Secure chat preferred Office [phone]    Sharman Debar 07/20/2023, 10:27 AM

## 2023-07-21 DIAGNOSIS — M609 Myositis, unspecified: Secondary | ICD-10-CM | POA: Diagnosis present

## 2023-07-21 DIAGNOSIS — R000 Tachycardia, unspecified: Secondary | ICD-10-CM

## 2023-07-21 DIAGNOSIS — E871 Hypo-osmolality and hyponatremia: Secondary | ICD-10-CM

## 2023-07-21 DIAGNOSIS — Q998 Other specified chromosome abnormalities: Secondary | ICD-10-CM | POA: Diagnosis not present

## 2023-07-21 DIAGNOSIS — I10 Essential (primary) hypertension: Secondary | ICD-10-CM

## 2023-07-21 DIAGNOSIS — R339 Retention of urine, unspecified: Secondary | ICD-10-CM

## 2023-07-21 DIAGNOSIS — G629 Polyneuropathy, unspecified: Secondary | ICD-10-CM | POA: Diagnosis not present

## 2023-07-21 DIAGNOSIS — F39 Unspecified mood [affective] disorder: Secondary | ICD-10-CM

## 2023-07-21 LAB — ANTI-SCLERODERMA ANTIBODY: Scleroderma (Scl-70) (ENA) Antibody, IgG: 0.2 AI (ref 0.0–0.9)

## 2023-07-21 LAB — CENTROMERE ANTIBODIES: Centromere Ab Screen: 0.2 AI (ref 0.0–0.9)

## 2023-07-21 MED ORDER — GABAPENTIN 400 MG PO CAPS
800.0000 mg | ORAL_CAPSULE | Freq: Three times a day (TID) | ORAL | Status: DC
  Administered 2023-07-21 – 2023-07-25 (×12): 800 mg via ORAL
  Filled 2023-07-21 (×13): qty 2

## 2023-07-21 MED ORDER — SODIUM CHLORIDE 0.9% FLUSH: 10.0000 mL | INTRAVENOUS | Status: DC | PRN

## 2023-07-21 MED ORDER — FLEET ENEMA RE ENEM: 1.0000 | ENEMA | Freq: Once | RECTAL | Status: DC | PRN

## 2023-07-21 MED ORDER — DIPHENHYDRAMINE HCL 25 MG PO CAPS
25.0000 mg | ORAL_CAPSULE | Freq: Four times a day (QID) | ORAL | Status: DC | PRN
  Administered 2023-07-23: 25 mg via ORAL
  Filled 2023-07-21: qty 1

## 2023-07-21 MED ORDER — PROCHLORPERAZINE 25 MG RE SUPP: 12.5000 mg | Freq: Four times a day (QID) | RECTAL | Status: DC | PRN

## 2023-07-21 MED ORDER — PROCHLORPERAZINE EDISYLATE 10 MG/2ML IJ SOLN: 5.0000 mg | Freq: Four times a day (QID) | INTRAMUSCULAR | Status: DC | PRN

## 2023-07-21 MED ORDER — MELATONIN 3 MG PO TABS
3.0000 mg | ORAL_TABLET | Freq: Every evening | ORAL | Status: DC | PRN
  Administered 2023-08-02: 3 mg via ORAL
  Filled 2023-07-21: qty 1

## 2023-07-21 MED ORDER — SODIUM CHLORIDE 0.9% FLUSH
10.0000 mL | Freq: Two times a day (BID) | INTRAVENOUS | Status: DC
  Administered 2023-07-21 (×2): 10 mL
  Administered 2023-07-22: 20 mL
  Administered 2023-07-23: 10 mL
  Administered 2023-07-23 – 2023-07-24 (×2): 20 mL
  Administered 2023-07-25 – 2023-08-16 (×33): 10 mL

## 2023-07-21 MED ORDER — ACETAMINOPHEN 325 MG PO TABS
325.0000 mg | ORAL_TABLET | ORAL | Status: DC | PRN
  Administered 2023-07-22 – 2023-08-15 (×15): 650 mg via ORAL
  Filled 2023-07-21 (×17): qty 2

## 2023-07-21 MED ORDER — ENOXAPARIN SODIUM 80 MG/0.8ML IJ SOSY
70.0000 mg | PREFILLED_SYRINGE | INTRAMUSCULAR | Status: DC
  Administered 2023-07-21 – 2023-08-16 (×27): 70 mg via SUBCUTANEOUS
  Filled 2023-07-21: qty 0.7
  Filled 2023-07-21 (×2): qty 0.8
  Filled 2023-07-21 (×2): qty 0.7
  Filled 2023-07-21: qty 0.8
  Filled 2023-07-21 (×6): qty 0.7
  Filled 2023-07-21: qty 0.8
  Filled 2023-07-21 (×2): qty 0.7
  Filled 2023-07-21: qty 0.8
  Filled 2023-07-21: qty 0.7
  Filled 2023-07-21 (×2): qty 0.8
  Filled 2023-07-21 (×2): qty 0.7
  Filled 2023-07-21: qty 0.8
  Filled 2023-07-21: qty 0.7
  Filled 2023-07-21: qty 0.8
  Filled 2023-07-21 (×4): qty 0.7
  Filled 2023-07-21: qty 0.8

## 2023-07-21 MED ORDER — CHLORHEXIDINE GLUCONATE CLOTH 2 % EX PADS
6.0000 | MEDICATED_PAD | Freq: Every day | CUTANEOUS | Status: DC
  Administered 2023-07-21 – 2023-07-22 (×2): 6 via TOPICAL

## 2023-07-21 MED ORDER — GUAIFENESIN-DM 100-10 MG/5ML PO SYRP: 5.0000 mL | ORAL_SOLUTION | Freq: Four times a day (QID) | ORAL | Status: DC | PRN

## 2023-07-21 MED ORDER — METOPROLOL TARTRATE 12.5 MG HALF TABLET
12.5000 mg | ORAL_TABLET | Freq: Two times a day (BID) | ORAL | Status: DC
  Administered 2023-07-21 – 2023-08-16 (×51): 12.5 mg via ORAL
  Filled 2023-07-21 (×51): qty 1

## 2023-07-21 MED ORDER — PROCHLORPERAZINE MALEATE 5 MG PO TABS: 5.0000 mg | ORAL_TABLET | Freq: Four times a day (QID) | ORAL | Status: DC | PRN

## 2023-07-21 MED ORDER — BISACODYL 10 MG RE SUPP: 10.0000 mg | Freq: Every day | RECTAL | Status: DC | PRN

## 2023-07-21 MED ORDER — ALUM & MAG HYDROXIDE-SIMETH 200-200-20 MG/5ML PO SUSP: 30.0000 mL | ORAL | Status: DC | PRN

## 2023-07-21 MED ORDER — ALTEPLASE 2 MG IJ SOLR
2.0000 mg | Freq: Once | INTRAMUSCULAR | Status: AC
  Administered 2023-07-21: 2 mg
  Filled 2023-07-21: qty 2

## 2023-07-21 NOTE — Progress Notes (Signed)
 Patient ID: Tiffany Velasquez, female   DOB: Feb 23, 1996, 28 y.o.   MRN: [id number]  SW made efforts to meet with pt to complete assessment but pt sleeping swallowing will follow-up.   SW made attempts to contact pt mother but phone goes directly to voicemail and mailbox is full. SW will make efforts to connect.   Norval Been, MSW, LCSW Office: [phone] Cell: [phone] Fax: [phone]

## 2023-07-21 NOTE — Progress Notes (Signed)
 Inpatient Rehabilitation Admission Medication Review by a Pharmacist  A complete drug regimen review was completed for this patient to identify any potential clinically significant medication issues.  High Risk Drug Classes Is patient taking? Indication by Medication  Antipsychotic Yes Compazine - N/V  Anticoagulant Yes Lovenox  - DVT px  Antibiotic Yes Augmentin  - PNA  Opioid No   Antiplatelet No   Hypoglycemics/insulin No   Vasoactive Medication Yes Hydrochlorothiazide , avapro  - HTN  Chemotherapy No   Other Yes Diamox  - edema Allopurinol  - gout Astelin ,dulera  - allergy  Alphagan /xalatan  - glaucoma B12/folic acid /MVI/vit D/thiamine  - supplementation Famotidine /protonix /carafate  - reflux Neurontin  - neuropathy Cymbalta  - MDD Melatonin - sleep     Type of Medication Issue Identified Description of Issue Recommendation(s)  Drug Interaction(s) (clinically significant)     Duplicate Therapy     Allergy      No Medication Administration End Date     Incorrect Dose     Additional Drug Therapy Needed     Significant med changes from prior encounter (inform family/care partners about these prior to discharge).    Other  Humira, colchicine , cosopt , fluticasone , reglan , amlodipine , retin A, Rhopressa , vita A Resume as needed in CIR or at discharge    Clinically significant medication issues were identified that warrant physician communication and completion of prescribed/recommended actions by midnight of the next day:  No  Name of provider notified for urgent issues identified:   Provider Method of Notification:     Pharmacist comments:   Time spent performing this drug regimen review (minutes):  20   Ivery Marking, PharmD, [place], AAHIVP, CPP Infectious Disease Pharmacist 07/21/2023 8:48 AM

## 2023-07-21 NOTE — Progress Notes (Signed)
   07/21/23 1146  Assess: MEWS Score  Temp 97.8 F (36.6 C)  BP 101/69  MAP (mmHg) 79  Pulse Rate (!) 114  Resp 17  SpO2 100 %  O2 Device Room Air  Assess: MEWS Score  MEWS Temp 0  MEWS Systolic 0  MEWS Pulse 2  MEWS RR 0  MEWS LOC 0  MEWS Score 2  MEWS Score Color Yellow  Assess: if the MEWS score is Yellow or Red  Were vital signs accurate and taken at a resting state? Yes  Does the patient meet 2 or more of the SIRS criteria? No  MEWS guidelines implemented  Yes, yellow  Treat  MEWS Interventions Considered administering scheduled or prn medications/treatments as ordered  Take Vital Signs  Increase Vital Sign Frequency  Yellow: Q2hr x1, continue Q4hrs until patient remains green for 12hrs  Escalate  MEWS: Escalate Yellow: Discuss with charge nurse and consider notifying provider and/or RRT  Notify: Charge Nurse/RN  Name of Charge Nurse/RN Notified Mekides, RN  Provider Notification  Provider Name/Title Lamon Pillow, [place]  Date Provider Notified 07/21/23  Time Provider Notified 1202  Method of Notification Face-to-face  Notification Reason Other (Comment) (Yellow MEWS)  Provider response In department  Date of Provider Response 07/21/23  Time of Provider Response 1202  Assess: SIRS CRITERIA  SIRS Temperature  0  SIRS Respirations  0  SIRS Pulse 1  SIRS WBC 0  SIRS Score Sum  1

## 2023-07-21 NOTE — Progress Notes (Addendum)
 PROGRESS NOTE   Subjective/Complaints: No acute events overnight.  Burning pain in bilateral feet.  Has had chronic tachycardia.  Staff reports she has been tearful at times.   ROS: Patient denies fever, new vision changes, nausea, vomiting, diarrhea,  shortness of breath or chest pain, headache + burning pain both feet    Objective:   No results found. No results for input(s): "WBC", "HGB", "HCT", "PLT" in the last 72 hours. Recent Labs    07/19/23 0131  NA 132*  K 3.9  CL 102  CO2 23  GLUCOSE 105*  BUN 10  CREATININE 0.49  CALCIUM 8.9    Intake/Output Summary (Last 24 hours) at 07/21/2023 1554 Last data filed at 07/21/2023 0600 Gross per 24 hour  Intake 240 ml  Output 514 ml  Net -274 ml        Physical Exam: Vital Signs Blood pressure 119/80, pulse (!) 101, temperature 97.6 F (36.4 C), temperature source Oral, resp. rate 20, height 5\' 9"  (1.753 m), weight (!) 149 kg, SpO2 100%.    General: NAD HEENT: Head is normocephalic, atraumatic, MMM Neck: Supple without JVD or lymphadenopathy Heart: Tachycardia Chest: CTA bilaterally without wheezes, rales, or rhonchi; no distress Abdomen: Soft, non-tender, non-distended, bowel sounds positive. Extremities: No clubbing, cyanosis, or edema. Pulses are 2+ Psych: Pt's affect is appropriate. Pt is cooperative Skin: Clean and intact without signs of breakdown Neuro:  Alert and oriented x4, b/l Feet very tender  Musculoskeletal:  Cervical back: Neck supple.     Comments: Ue's 5-/5 B/L LE's HF 3-/5 at best B/L; KE/KF 4-/5 B/L; DF and PF 4/5- but limited by pain due to severe pain with light touch  Skin:    General: Skin is warm and dry.     Comments: Port accessed in R chest    Assessment/Plan: 1. Functional deficits which require 3+ hours per day of interdisciplinary therapy in a comprehensive inpatient rehab setting. Physiatrist is providing close team  supervision and 24 hour management of active medical problems listed below. Physiatrist and rehab team continue to assess barriers to discharge/monitor patient progress toward functional and medical goals  Care Tool:  Bathing    Body parts bathed by patient: Right arm, Left arm, Chest, Abdomen, Face   Body parts bathed by helper: Front perineal area, Buttocks, Right upper leg, Left upper leg, Right lower leg, Left lower leg     Bathing assist Assist Level: Maximal Assistance - Patient 24 - 49%     Upper Body Dressing/Undressing Upper body dressing   What is the patient wearing?: Hospital gown only    Upper body assist Assist Level: Total Assistance - Patient < 25%    Lower Body Dressing/Undressing Lower body dressing    Lower body dressing activity did not occur: Environmental limitations (no clothes available)       Lower body assist       Toileting Toileting    Toileting assist Assist for toileting: Dependent - Patient 0%     Transfers Chair/bed transfer  Transfers assist     Chair/bed transfer assist level: Dependent - mechanical lift     Locomotion Ambulation   Ambulation assist  Ambulation activity did not occur: Safety/medical concerns          Walk 10 feet activity   Assist  Walk 10 feet activity did not occur: Safety/medical concerns        Walk 50 feet activity   Assist Walk 50 feet with 2 turns activity did not occur: Safety/medical concerns         Walk 150 feet activity   Assist Walk 150 feet activity did not occur: Safety/medical concerns         Walk 10 feet on uneven surface  activity   Assist Walk 10 feet on uneven surfaces activity did not occur: Safety/medical concerns         Wheelchair     Assist Is the patient using a wheelchair?: No             Wheelchair 50 feet with 2 turns activity    Assist            Wheelchair 150 feet activity     Assist          Blood  pressure 119/80, pulse (!) 101, temperature 97.6 F (36.4 C), temperature source Oral, resp. rate 20, height 5\' 9"  (1.753 m), weight (!) 149 kg, SpO2 100%.  Medical Problem List and Plan: 1. Functional deficits secondary to myositis- since was getting better, Neuro declined muscle biopsy             -patient may not shower for the moment- will need to determine with R chest port? If unaccessed?             -ELOS/Goals: min A hopefully             -Continue CIR 2.  Antithrombotics: -DVT/anticoagulation:  Pharmaceutical: Lovenox              -antiplatelet therapy: N/A 3. Pain Management: Has been using oxycodone  prn past 3 days? For foot, leg and back pain- also has Gabapentin  - duloxetine  stopped initially due to myositis- 30 mg resumed 4/23 4. Mood/Behavior/Sleep: LCSW to follow for evaluation and support.              -antipsychotic agents: N/A  -Neuropsych consult 5. Neuropsych/cognition: This patient is capable of making decisions on her own behalf. 6. Skin/Wound Care: Routine pressure relief measures.  7. Fluids/Electrolytes/Nutrition: Monitor I/O. 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  --Monitor CK intermittently improved from 1536-->514 --consider muscle biopsy if symptoms do not improve 9. Multifocal PNA: Likely aspiration--most significant in lingula and RLL on CT w/o contrast chest 07/18/23 --Hx of chronic intermittent N/V. May need swallow evaluation. Swallow precautions added.   --started on Augmentin  04/22-->04/25 for 5 day course.  11. Multiple Vitamin deficiencies: Vitamin B1  250 mg daily 4/22-->4/28 followed by 100 mg daily IV.              --now on B12 IM, IV folate. Vitamin A  10,000 units daily. Vitamin D  50,000/wk             --recheck Mg, Phos (supplemented) 12. Resting tachycardia: HR has been 110-120 range at  baseline --not on any BB -Start metoprolol  12.5 mg BID 13. Accelerated HTN: BP poorly controlled. Continue hydrochlorothiazide  and Avapro  -- addition of BB? 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              --IVIG 04/21- 04/24. Followed by Dr. Curley Double. 15. Urinary retention: Purewick being used--monitor voiding with PVR/bladder scan. Toilet every 4 hours  -4/25 PVR 64, continue to monitor  16. Chronic Asthma: Continue Dulera  BID and singulair  --Followed by Dr. Idolina Maker.  17. H/o chronic gastritis w/nausea and vomiting: D/c Ibuprofen . Continue Protonix  40 mg and Pepcid  -- Followed by [facility].  18. Abnormal LFTs: Normal liver parenchyma and GB sludge noted on abdominal ultrasound 07/16/23.  --Resolving. Recheck in am-pending 19. Peripheral polyneuropathy: Cymbalta  resumed 04/23 as CK trending down. Will check weekly --Gabapentin  600 mg TID 4/25 increase gabapentin  to 800mg  TID, consider trying lyrica instead. Qutenza may be option outpatient. She says she had visit in PM&R clinic planned in may to discuss her pain with me (Dr. Rayleen Cal) to discuss her pain 18. Anemia of chronic disease: Likely malabsorption w/ Low iron but elevated TIBC/Ferritin  -Repeat labs pending  19. Morbid obesity: BMI 48. Followed by RD/Bariatric nutrition for wt loss. Plan for Bariatric surgery 08/2023.              --Now on aggressive vitamin supplementation.  20. Hyponatremia: Na down to 132-->question due to IVIG 4/31-4/24.  --May see rise in LFTs again.  -5/25 recheck labs still pending, will ask nursing to call to check on this 21. Severe tricuspid regurgitation on ECHO        LOS: 1 days A FACE TO FACE EVALUATION WAS PERFORMED  Lylia Sand 07/21/2023,  3:54 PM

## 2023-07-21 NOTE — Progress Notes (Signed)
 Pr refused orthostatic this mornig, became upset and started crying about getting up

## 2023-07-21 NOTE — Evaluation (Signed)
 Occupational Therapy Assessment and Plan  Patient Details  Name: Tiffany Velasquez MRN: [id number] Date of Birth: [birth year]  OT Diagnosis: acute pain and muscle weakness (generalized) Rehab Potential: Rehab Potential (ACUTE ONLY): Fair ELOS: ~4 weeks   Today's Date: 07/21/2023 OT Individual Time: 1478-2956 OT Individual Time Calculation (min): 60 min     Hospital Problem: Principal Problem:   Myositis associated antibody positive Active Problems:   Myositis   Past Medical History:  Past Medical History:  Diagnosis Date   Asthma    Eczema    Glaucoma    Hypertension    Morbid obesity (HCC)    Transaminitis 07/15/2023   Uveitic glaucoma of both eyes, 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   Past Surgical History:  Past Surgical History:  Procedure Laterality Date   BIOPSY  04/04/2023   Procedure: BIOPSY;  Surgeon: Junie Olds, MD;  Location: [facility];  Service: General;;   CATARACT EXTRACTION     ESOPHAGOGASTRODUODENOSCOPY N/A 04/04/2023   Procedure: ESOPHAGOGASTRODUODENOSCOPY (EGD);  Surgeon: Junie Olds, MD;  Location: [facility];  Service: General;  Laterality: N/A;   PORTA CATH INSERTION      Assessment & Plan Clinical Impression: Patient is a 28 y.o. 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 Per reports falls X 6 in 2 weeks  with lack of feeling in legs and occasional dizziness when standing. She denied  She was sent to ED for evaluation. She was found to have low folate, low vitamin D  and Vitamin A , elevated CRP, low BI <6. She was  started on IV thiamine  qid,Vitamin A  IM, IV  folate, B12 injections and Vitamin D  supplement.MRI brain and spine without acute abnormality.  CT chest done due to intermittent tachycardia and she was also found to have multifocal PNA treated with 5 day course of Augmentin . 2 D echo showed EF 55% with moderate to severe TVR and G1DD.     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 Discontinuation of duloxetine  (as can contribute to rhabdo)and muscle biopsy recommended initially by Dr.Stack but patient has had improvement with aggressive vitamin supplementation symptoms felt to be due to nutritional etiology though infectious or inflammatory etiology questioned. Lidocaine  gel added for chronic foot pain and colchicine  d/c.    Pt reports severe painful peripheral neuropathy in her feet and generalized LE>UE weakness.  Patient transferred to CIR on 07/20/2023 .    Patient currently requires total +2 with basic self-care skills and +2 total A for basic mobility  secondary to muscle weakness and acute pain, decreased cardiorespiratoy endurance, and decreased sitting balance, decreased standing balance, and decreased balance strategies.  Prior to hospitalization, patient could complete ADL with independent  prior to this progressive decline  Patient will benefit from skilled intervention to decrease level of assist with basic self-care skills and increase independence with basic self-care skills prior to discharge home with care partner.  Anticipate patient will require moderate physical assestance and follow up home health.  OT - End of Session Activity Tolerance: Tolerates < 10 min activity with changes in vital  signs Endurance Deficit: Yes Endurance Deficit Description: elevated HR with minimal activity 130s-140s OT Assessment Rehab Potential (ACUTE ONLY): Fair OT Patient demonstrates impairments in the following area(s):  Balance;Pain;Endurance;Motor;Safety;Sensory OT Basic ADL's Functional Problem(s): Bathing;Dressing;Toileting;Grooming OT Transfers Functional Problem(s): Toilet;Tub/Shower OT Additional Impairment(s): None OT Plan OT Intensity: Minimum of 1-2 x/day, 45 to 90 minutes OT Frequency: 5 out of 7 days OT Duration/Estimated Length of Stay: ~4 weeks OT Treatment/Interventions: Self Care/advanced ADL retraining;Therapeutic Activities;UE/LE Coordination activities;Pain management;Discharge planning;Balance/vestibular training;Disease mangement/prevention;Functional mobility training;Patient/family education;Skin care/wound managment;Therapeutic Exercise;Community reintegration;Visual/perceptual remediation/compensation;DME/adaptive equipment instruction;Neuromuscular re-education;Psychosocial support;UE/LE Strength taining/ROM;Wheelchair propulsion/positioning OT Self Feeding Anticipated Outcome(s): n/a OT Basic Self-Care Anticipated Outcome(s): min to mod A OT Toileting Anticipated Outcome(s): mod A OT Bathroom Transfers Anticipated Outcome(s): min A OT Recommendation Recommendations for Other Services: Neuropsych consult Patient destination: Home Follow Up Recommendations: Home health OT Equipment Recommended: To be determined   OT Evaluation Precautions/Restrictions  Precautions Precautions: Fall Recall of Precautions/Restrictions: Intact Precaution/Restrictions Comments: 6 falls recently Restrictions Weight Bearing Restrictions Per Provider Order: No General Chart Reviewed: Yes Family/Caregiver Present: No Vital Signs Therapy Vitals Pulse Rate: (!) 144 BP: (!) 138/104 Patient Position (if appropriate): Sitting Pain   Home Living/Prior Functioning Home Living Family/patient expects to be discharged to:: Private residence Living Arrangements: Parent, Other (Comment) (parent, uncle brother) Available Help at Discharge: Family, Available 24 hours/day Type of Home: House Home Access:  Stairs to enter Secretary/administrator of Steps: 4 Home Layout: One level Bathroom Shower/Tub: Tub/shower unit  Lives With: Family Vision Baseline Vision/History: 3 Glaucoma Ability to See in Adequate Light: 2 Moderately impaired Patient Visual Report: Blurring of vision Vision Assessment?: Yes Eye Alignment: Within Functional Limits Ocular Range of Motion: Within Functional Limits Alignment/Gaze Preference: Within Defined Limits Tracking/Visual Pursuits: Able to track stimulus in all quads without difficulty Perception  Perception: Within Functional Limits Praxis Praxis: WFL Cognition Cognition Overall Cognitive Status: Within Functional Limits for tasks assessed Arousal/Alertness: Awake/alert Orientation Level: Person;Place;Situation Person: Oriented Place: Oriented Situation: Oriented Memory: Appears intact Attention: Focused;Sustained;Selective Focused Attention: Appears intact Sustained Attention: Appears intact Selective Attention: Appears intact Awareness: Appears intact Behaviors: Lability;Poor frustration tolerance Safety/Judgment: Appears intact Brief Interview for Mental Status (BIMS) Repetition of Three Words (First Attempt): 3 Temporal Orientation: Year: Correct Temporal Orientation: Month: Accurate within 5 days Temporal Orientation: Day: Correct Recall: "Sock": Yes, no cue required Recall: "Blue": Yes, no cue required Recall: "Bed": Yes, no cue required BIMS Summary Score: 15 Sensation Sensation Light Touch: Impaired Detail Light Touch Impaired Details: Impaired RLE;Impaired LLE Proprioception: Impaired Detail Proprioception Impaired Details: Impaired RLE;Impaired LLE Coordination Gross Motor Movements are Fluid and Coordinated: No Fine Motor Movements are Fluid and Coordinated: Yes Motor  Motor Motor: Abnormal postural alignment and control;Motor impersistence Motor - Skilled Clinical Observations: generalized weakness  Trunk/Postural Assessment   Cervical Assessment Cervical Assessment: Within Functional Limits Thoracic Assessment Thoracic Assessment:  (rounded shoulders - forward flexed) Lumbar Assessment Lumbar Assessment:  (posterior pelvic tilt) Postural Control Postural Control: Deficits on evaluation Trunk Control: weak in upright  Balance Balance Balance Assessed: Yes Dynamic Sitting Balance Sitting balance - Comments: pt holding head up for longer periods of time in sitting today. Pt able to lean down with elbows on knees and return to sitting. Static Standing Balance Static Standing - Level of Assistance: 2: Max assist Static Standing - Comment/# of Minutes: ~ 10 seconds Extremity/Trunk Assessment RUE Assessment RUE Assessment: Within Functional Limits LUE Assessment LUE Assessment: Within Functional Limits  Care Tool Care Tool Self Care Eating   Eating  Assist Level: Contact Guard/Touching assist    Oral Care    Oral Care Assist Level: Contact Guard/Toucning assist    Bathing   Body parts bathed by patient: Right arm;Left arm;Chest;Abdomen;Face Body parts bathed by helper: Front perineal area;Buttocks;Right upper leg;Left upper leg;Right lower leg;Left lower leg   Assist Level: Maximal Assistance - Patient 24 - 49%    Upper Body Dressing(including orthotics)   What is the patient wearing?: Hospital gown only   Assist Level: Total Assistance - Patient < 25%    Lower Body Dressing (excluding footwear) Lower body dressing activity did not occur: Environmental limitations (no clothes available)        Putting on/Taking off footwear   What is the patient wearing?: Non-skid slipper socks Assist for footwear: Dependent - Patient 0%       Care Tool Toileting Toileting activity   Assist for toileting: Dependent - Patient 0%     Care Tool Bed Mobility Roll left and right activity        Sit to lying activity        Lying to sitting on side of bed activity   Lying to sitting on side of bed  assist level: the ability to move from lying on the back to sitting on the side of the bed with no back support.: Contact Guard/Touching assist     Care Tool Transfers Sit to stand transfer   Sit to stand assist level: 2 Helpers    Chair/bed transfer   Chair/bed transfer assist level: 2 Chief Strategy Officer transfer activity did not occur: Safety/medical concerns       Care Tool Cognition  Expression of Ideas and Wants Expression of Ideas and Wants: 4. Without difficulty (complex and basic) - expresses complex messages without difficulty and with speech that is clear and easy to understand  Understanding Verbal and Non-Verbal Content Understanding Verbal and Non-Verbal Content: 4. Understands (complex and basic) - clear comprehension without cues or repetitions   Memory/Recall Ability     Refer to Care Plan for Long Term Goals  SHORT TERM GOAL WEEK 1 OT Short Term Goal 1 (Week 1): Pt will transfer with LRAD with mod +2 to [facility] OT Short Term Goal 2 (Week 1): Pt will consistenly get to EOB with bed rail and features wtih min guard in prep for ADL OT Short Term Goal 3 (Week 1): Pt will don shirt with setup OT Short Term Goal 4 (Week 1): Pt will thread LB clothing with mod A  Recommendations for other services: Neuropsych   Skilled Therapeutic Intervention 1:1 Ot eval initiated with Ot purpose, role and goals discussed with pt. Pt very tearful and fearful when first arrived. But with encouragement and playing some pop music pt was willing to move and get out the bed. Pt performed UB bathing at bed level with setup. LB with total A. Pt does not have clothes for eval With HOB elevated and use of the bed rail pt able to come to e   guard and be able to place bilateral feet on the floor.  Pt performed first sit to stand from elevated height of bed with total A but maintained a trunk flexion. 2nd attempt pt able to come up into upright position with trunk and stay up for almost  10 seconds. +3 for safety scoot/squat pivot transfer into the recliner from elevated bed height. Pt sat at sink to perform oral care. Pt with lots of residual grits  and eggs in her mouth- unsure why she can't clear it on her own. RN hooked up suction in room. Pt left sitting up in the recliner with LEs elevated with RN.   PT with elevated HR throughout sesion 130s-140s     ADL ADL Eating: Minimal assistance Where Assessed-Eating: Bed level Grooming: Minimal assistance Where Assessed-Grooming: Sitting at sink Upper Body Bathing: Moderate assistance Where Assessed-Upper Body Bathing: Bed level Lower Body Bathing:  (total A) Where Assessed-Lower Body Bathing: Bed level Upper Body Dressing:  (hosptial gown with total A) Lower Body Dressing: Not assessed Mobility  Bed Mobility Bed Mobility: Supine to Sit Supine to Sit: Contact Guard/Touching assist Transfers Sit to Stand: Total Assistance - Patient < 25% (with RW) Stand to Sit: Total Assistance - Patient < 25% (with RW)   Discharge Criteria: Patient will be discharged from OT if patient refuses treatment 3 consecutive times without medical reason, if treatment goals not met, if there is a change in medical status, if patient makes no progress towards goals or if patient is discharged from hospital.  The above assessment, treatment plan, treatment alternatives and goals were discussed and mutually agreed upon: by patient  Oran Bimler 07/21/2023, 11:41 AM

## 2023-07-21 NOTE — Progress Notes (Signed)
 Inpatient Rehabilitation  Patient information reviewed and entered into eRehab system by [facility]. Karen Kays., CCC/SLP, PPS Coordinator.  Information including medical coding, functional ability and quality indicators will be reviewed and updated through discharge.

## 2023-07-21 NOTE — Plan of Care (Signed)
  Problem: RH Balance Goal: LTG Patient will maintain dynamic standing with ADLs (OT) Description: LTG:  Patient will maintain dynamic standing balance with assist during activities of daily living (OT)  Flowsheets (Taken 07/21/2023 1217) LTG: Pt will maintain dynamic standing balance during ADLs with: Moderate Assistance - Patient 50 - 74%   Problem: Sit to Stand Goal: LTG:  Patient will perform sit to stand in prep for activites of daily living with assistance level (OT) Description: LTG:  Patient will perform sit to stand in prep for activites of daily living with assistance level (OT) Flowsheets (Taken 07/21/2023 1217) LTG: PT will perform sit to stand in prep for activites of daily living with assistance level: Minimal Assistance - Patient > 75%   Problem: RH Grooming Goal: LTG Patient will perform grooming w/assist,cues/equip (OT) Description: LTG: Patient will perform grooming with assist, with/without cues using equipment (OT) Flowsheets (Taken 07/21/2023 1217) LTG: Pt will perform grooming with assistance level of: Supervision/Verbal cueing   Problem: RH Bathing Goal: LTG Patient will bathe all body parts with assist levels (OT) Description: LTG: Patient will bathe all body parts with assist levels (OT) Flowsheets (Taken 07/21/2023 1217) LTG: Pt will perform bathing with assistance level/cueing: Minimal Assistance - Patient > 75%   Problem: RH Dressing Goal: LTG Patient will perform upper body dressing (OT) Description: LTG Patient will perform upper body dressing with assist, with/without cues (OT). Flowsheets (Taken 07/21/2023 1217) LTG: Pt will perform upper body dressing with assistance level of: Set up assist Goal: LTG Patient will perform lower body dressing w/assist (OT) Description: LTG: Patient will perform lower body dressing with assist, with/without cues in positioning using equipment (OT) Flowsheets (Taken 07/21/2023 1217) LTG: Pt will perform lower body dressing with  assistance level of: Minimal Assistance - Patient > 75%   Problem: RH Toileting Goal: LTG Patient will perform toileting task (3/3 steps) with assistance level (OT) Description: LTG: Patient will perform toileting task (3/3 steps) with assistance level (OT)  Flowsheets (Taken 07/21/2023 1217) LTG: Pt will perform toileting task (3/3 steps) with assistance level: Moderate Assistance - Patient 50 - 74%   Problem: RH Toilet Transfers Goal: LTG Patient will perform toilet transfers w/assist (OT) Description: LTG: Patient will perform toilet transfers with assist, with/without cues using equipment (OT) Flowsheets (Taken 07/21/2023 1217) LTG: Pt will perform toilet transfers with assistance level of: Minimal Assistance - Patient > 75%   Problem: RH Tub/Shower Transfers Goal: LTG Patient will perform tub/shower transfers w/assist (OT) Description: LTG: Patient will perform tub/shower transfers with assist, with/without cues using equipment (OT) Flowsheets (Taken 07/21/2023 1217) LTG: Pt will perform tub/shower stall transfers with assistance level of: Minimal Assistance - Patient > 75%

## 2023-07-21 NOTE — Evaluation (Addendum)
 Physical Therapy Assessment and Plan  Patient Details  Name: Tiffany Velasquez MRN: [id number] Date of Birth: 08-25-1995  PT Diagnosis: Abnormal posture, Difficulty walking, Impaired sensation, and Pain in B feet Rehab Potential: Fair ELOS: 4 weeks   Today's Date: 07/21/2023 PT Individual Time: 1400-1440 and 1045-1200 PT Individual Time Calculation (min): 40 min  and 75 min  Hospital Problem: Principal Problem:   Myositis associated antibody positive Active Problems:   Myositis   Past Medical History:  Past Medical History:  Diagnosis Date   Asthma    Eczema    Glaucoma    Hypertension    Morbid obesity (HCC)    Transaminitis 07/15/2023   Uveitic glaucoma of both eyes, 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   Past Surgical History:  Past Surgical History:  Procedure Laterality Date   BIOPSY  04/04/2023   Procedure: BIOPSY;  Surgeon: Junie Olds, MD;  Location: [facility];  Service: General;;   CATARACT EXTRACTION     ESOPHAGOGASTRODUODENOSCOPY N/A 04/04/2023   Procedure: ESOPHAGOGASTRODUODENOSCOPY (EGD);  Surgeon: Junie Olds, MD;  Location: [facility];  Service: General;  Laterality: N/A;   PORTA CATH INSERTION      Assessment & Plan Clinical Impression:  RAECHELLE Velasquez is a 28 year old R handed  female with history of HTN, 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 Per reports falls X 6 in 2 weeks  with lack of feeling in legs and occasional dizziness when standing. She denied  She was sent to ED for evaluation. She was found to have low folate, low vitamin D  and Vitamin A , elevated CRP, low  BI <6. She was started on IV thiamine  qid,Vitamin A  IM, IV  folate, B12 injections and Vitamin D  supplement.MRI brain and spine without acute abnormality.  CT chest done due to intermittent tachycardia and she was also found to have multifocal PNA treated with 5 day course of Augmentin . 2 D echo showed EF 55% with moderate to severe TVR and G1DD.     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 Discontinuation of duloxetine  (as can contribute to rhabdo)and muscle biopsy recommended initially by Dr.Stack but patient has had improvement with aggressive vitamin supplementation symptoms felt to be due to nutritional etiology though infectious or inflammatory etiology questioned. Lidocaine  gel added for chronic foot pain and colchicine  d/  Patient currently requires total with mobility secondary to muscle weakness and decreased coordination and decreased motor planning.  Prior to hospitalization, patient was modified independent  with mobility and lived with Family in a House home.  Home access is 4Stairs to enter.  Patient will benefit from skilled PT intervention to maximize safe functional mobility, minimize fall risk, and decrease caregiver burden for planned discharge home with 24 hour supervision vs assist as needed .  Anticipate patient will benefit from follow up HH at discharge.  PT - End of Session Activity Tolerance: Tolerates 10 - 20 min activity with multiple rests Endurance Deficit: Yes Endurance Deficit Description: elevated HR with minimal activity 130s-140s PT Assessment Rehab Potential (ACUTE/IP ONLY): Fair PT Barriers to Discharge: Inaccessible home environment PT Patient demonstrates impairments in  the following area(s): Balance;Endurance;Motor PT Transfers Functional Problem(s): Bed Mobility;Bed to Chair;Car;Furniture PT Locomotion Functional Problem(s): Ambulation;Stairs;Wheelchair Mobility PT Plan PT  Intensity: Minimum of 1-2 x/day ,45 to 90 minutes PT Frequency: 5 out of 7 days PT Duration Estimated Length of Stay: 4 weeks PT Treatment/Interventions: Ambulation/gait training;Community reintegration;Neuromuscular re-education;Stair training;UE/LE Strength taining/ROM;Wheelchair propulsion/positioning;Therapeutic Activities;UE/LE Coordination activities;Discharge planning;Functional mobility training;Therapeutic Exercise;Patient/family education PT Transfers Anticipated Outcome(s): CGA PT Locomotion Anticipated Outcome(s): CGA PT Recommendation Recommendations for Other Services: Neuropsych consult Follow Up Recommendations: Home health PT Patient destination: Home Equipment Recommended: To be determined Equipment Details: pt uses SPC or RW of uncles as needed.   PT Evaluation Precautions/Restrictions Precautions Precautions: Fall Recall of Precautions/Restrictions: Intact Precaution/Restrictions Comments: 6 falls recently Restrictions Weight Bearing Restrictions Per Provider Order: No General Chart Reviewed: Yes PT Amount of Missed Time (min): 20 Minutes PT Missed Treatment Reason: Other (Comment) (lethargic, PT decision for safety.) Family/Caregiver Present: No Vital SignsTherapy Vitals Temp: 97.6 F (36.4 C) Temp Source: Oral Pulse Rate: (!) 101 Resp: 20 BP: 119/80 Patient Position (if appropriate): Lying Oxygen Therapy SpO2: 100 % O2 Device: Room Air Pain4/10 PM session back and feet. Pain Assessment Pain Scale: 0-10 Pain Score: 4  Pain Type: Chronic pain;Neuropathic pain Pain Location: Foot Pain Orientation: Right;Left Pain Onset: On-going Pain Interference Pain Interference Pain Effect on Sleep: 2. Occasionally;1. Rarely or not at all Pain Interference with Therapy Activities: 2. Occasionally Pain Interference with Day-to-Day Activities: 2. Occasionally Home Living/Prior Functioning Home Living Available Help at Discharge: Family;Available 24  hours/day Type of Home: House Home Access: Stairs to enter [employer] of Steps: 4 Entrance Stairs-Rails: Left Home Layout: One level Bathroom Shower/Tub: Tub/shower unit  Lives With: Family Prior Function Level of Independence: Requires assistive device for independence;Independent with transfers  Able to Take Stairs?: Yes (although has fallen on stairs per report.) Vision/Perception  Vision - History Ability to See in Adequate Light: 2 Moderately impaired Vision - Assessment Eye Alignment: Within Functional Limits Ocular Range of Motion: Within Functional Limits Alignment/Gaze Preference: Within Defined Limits Tracking/Visual Pursuits: Able to track stimulus in all quads without difficulty Perception Perception: Within Functional Limits Praxis Praxis: WFL  Cognition Overall Cognitive Status: Within Functional Limits for tasks assessed Arousal/Alertness: Awake/alert Attention: Focused;Sustained;Selective Focused Attention: Appears intact Sustained Attention: Appears intact Selective Attention: Appears intact Memory: Appears intact Awareness: Appears intact Behaviors: Lability;Poor frustration tolerance Safety/Judgment: Appears intact Sensation Sensation Light Touch: Impaired Detail Light Touch Impaired Details: Impaired RLE;Impaired LLE Proprioception: Impaired Detail Proprioception Impaired Details: Impaired RLE;Impaired LLE Coordination Gross Motor Movements are Fluid and Coordinated: No Fine Motor Movements are Fluid and Coordinated: Yes Heel Shin Test: unable to perform 2/2 body habitus. Motor  Motor Motor: Abnormal postural alignment and control;Motor impersistence Motor - Skilled Clinical Observations: generalized weakness   Trunk/Postural Assessment  Cervical Assessment Cervical Assessment: Within Functional Limits Thoracic Assessment Thoracic Assessment:  (rounded shoulders.) Lumbar Assessment Lumbar Assessment: Exceptions to [facility] (posterior  pelvic tily.) Postural Control Postural Control: Deficits on evaluation Trunk Control: weak in upright  Balance Balance Balance Assessed: Yes Static Sitting Balance Static Sitting - Balance Support: Feet supported;Bilateral upper extremity supported Static Sitting - Level of Assistance: 5: Stand by assistance Dynamic Sitting Balance Dynamic Sitting - Balance Support: Feet supported Dynamic Sitting - Level of Assistance: 4: Min assist Sitting balance - Comments: pt holding head up for longer periods of time in sitting today. Pt able to lean down with elbows on knees and return to sitting. Static Standing Balance Static Standing - Level  of Assistance: 2: Max Therapist, music Standing - Comment/# of Minutes: ~ 10 seconds Extremity Assessment  RUE Assessment RUE Assessment: Within Functional Limits LUE Assessment LUE Assessment: Within Functional Limits RLE Assessment RLE Assessment: Exceptions to [facility] General Strength Comments: grossly 3+/5 throughout but not formally tested. LLE Assessment LLE Assessment: Exceptions to [facility] General Strength Comments: grossly 3+/5 throughout but not formally tested.  Care Tool Care Tool Bed Mobility Roll left and right activity   Roll left and right assist level: Minimal Assistance - Patient > 75%    Sit to lying activity   Sit to lying assist level: Maximal Assistance - Patient 25 - 49%    Lying to sitting on side of bed activity   Lying to sitting on side of bed assist level: the ability to move from lying on the back to sitting on the side of the bed with no back support.: Moderate Assistance - Patient 50 - 74%     Care Tool Transfers Sit to stand transfer   Sit to stand assist level: 2 Helpers    Chair/bed transfer   Chair/bed transfer assist level: Dependent - Armed forces operational officer transfer activity did not occur: Safety/medical concerns        Care Tool Locomotion Ambulation Ambulation activity did not occur:  Safety/medical concerns        Walk 10 feet activity Walk 10 feet activity did not occur: Safety/medical concerns       Walk 50 feet with 2 turns activity Walk 50 feet with 2 turns activity did not occur: Safety/medical concerns      Walk 150 feet activity Walk 150 feet activity did not occur: Safety/medical concerns      Walk 10 feet on uneven surfaces activity Walk 10 feet on uneven surfaces activity did not occur: Safety/medical concerns      Stairs Stair activity did not occur: Safety/medical concerns        Walk up/down 1 step activity Walk up/down 1 step or curb (drop down) activity did not occur: Safety/medical concerns      Walk up/down 4 steps activity Walk up/down 4 steps activity did not occur: Safety/medical concerns      Walk up/down 12 steps activity Walk up/down 12 steps activity did not occur: Safety/medical concerns      Pick up small objects from floor Pick up small object from the floor (from standing position) activity did not occur: Safety/medical concerns      Wheelchair Is the patient using a wheelchair?: No          Wheel 50 feet with 2 turns activity      Wheel 150 feet activity        Refer to Care Plan for Long Term Goals  SHORT TERM GOAL WEEK 1    Recommendations for other services: Neuropsych  Skilled Therapeutic Intervention Evaluation completed (see details above and below) with education on PT POC and goals and individual treatment initiated with focus on strength, endurance, balance, transfers, progress to gait.  First session:  Pt presents sitting in recliner asleep but does arouse and agree to therapy.  Pt very slow to respond to questioning.  Pt does get tearful for short period of time re: situation.  Pt attempts sit to stand w/ A + 2 x 2 trials but unable to clear buttocks from recliner.  Stedy utilized but pt still unable to complete sit to stand even w/ A + 2.  Maxi move utilized for  transfer recliner to bed w/ incontinence  of bowel during transfer.  Pt able to roll side to side for pericare and removal of sling w/ min A to increase rolling for body habitus.  Pt does follow directions well and uses siderails to initiate.  Pt remained supine in bed w/ all needs in reach.  BP in supine taken by nurse of 101/69, HR 114.  Maximove left in BR w/ pad.  Second session:  Pt presents supine in bed w/ covers over head and increased stim needed to arouse.  Pt lethargic but agreeable to sitting EOB.  Pt requires mod A for sup to sit after bringing LES off EOB.  Pt requires blocking of L knee for safety 2/2 closeness to EOB.  Pt continuing to require increased time for responses and PT suggests lying down w/ Max A.  Pt then states improved awareness and wishes to attempt OOB again.  NT present for +2 for sup to sit w/ min A.  Pt stood w/ RW and elevated bed height and A + 2.  Pt stood x 5 seconds before self initiating stand to sit and assisted into bed w/ max A + 2.  Pt re-positioned for comfort and care handed off to nursing in room.     Mobility Bed Mobility Bed Mobility: Rolling Right;Rolling Left;Sit to Supine Rolling Right: Minimal Assistance - Patient > 75% Rolling Left: Minimal Assistance - Patient > 75% Supine to Sit: Moderate Assistance - Patient 50-74% Sit to Supine: Maximal Assistance - Patient 25-49% (lethargy) Transfers Transfers: Sit to Stand;Stand to Sit Sit to Stand: 2 Helpers Stand to Sit: 2 Public house manager Transfers: 2 Architect: Stedy (attempted Stedy but unable from recliner, maxi-move used to transfer reliner > bed.) Locomotion  Gait Ambulation: No Stairs / Additional Locomotion Stairs: No Wheelchair Mobility Wheelchair Mobility: No   Discharge Criteria: Patient will be discharged from PT if patient refuses treatment 3 consecutive times without medical reason, if treatment goals not met, if there is a change in medical status, if patient makes no progress towards goals  or if patient is discharged from hospital.  The above assessment, treatment plan, treatment alternatives and goals were discussed and mutually agreed upon: by patient  Odessia Benedict 07/21/2023, 2:44 PM

## 2023-07-21 NOTE — Progress Notes (Signed)
 Met with patient to review current situation, team conference and plan of care. Reviewed book. Continue to follow along to provide educational needs to facilitate preparation for discharge.

## 2023-07-22 DIAGNOSIS — E876 Hypokalemia: Secondary | ICD-10-CM

## 2023-07-22 DIAGNOSIS — E5111 Dry beriberi: Secondary | ICD-10-CM

## 2023-07-22 DIAGNOSIS — Q998 Other specified chromosome abnormalities: Secondary | ICD-10-CM | POA: Diagnosis not present

## 2023-07-22 LAB — COMPREHENSIVE METABOLIC PANEL WITH GFR
ALT: 85 U/L — ABNORMAL HIGH (ref 0–44)
AST: 162 U/L — ABNORMAL HIGH (ref 15–41)
Albumin: 2.2 g/dL — ABNORMAL LOW (ref 3.5–5.0)
Alkaline Phosphatase: 51 U/L (ref 38–126)
Anion gap: 10 (ref 5–15)
BUN: 23 mg/dL — ABNORMAL HIGH (ref 6–20)
CO2: 15 mmol/L — ABNORMAL LOW (ref 22–32)
Calcium: 9.4 mg/dL (ref 8.9–10.3)
Chloride: 106 mmol/L (ref 98–111)
Creatinine, Ser: 0.52 mg/dL (ref 0.44–1.00)
GFR, Estimated: >60 mL/min (ref >60)
Glucose, Bld: 95 mg/dL (ref 70–99)
Potassium: 3.4 mmol/L — ABNORMAL LOW (ref 3.5–5.1)
Sodium: 131 mmol/L — ABNORMAL LOW (ref 135–145)
Total Bilirubin: 0.5 mg/dL (ref 0.0–1.2)
Total Protein: 10.5 g/dL — ABNORMAL HIGH (ref 6.5–8.1)

## 2023-07-22 LAB — CBC WITH DIFFERENTIAL/PLATELET
Abs Immature Granulocytes: 0 10*3/uL (ref 0.00–0.07)
Basophils Absolute: 0.1 10*3/uL (ref 0.0–0.1)
Basophils Relative: 1 %
Eosinophils Absolute: 0.4 10*3/uL (ref 0.0–0.5)
Eosinophils Relative: 6 %
HCT: 31.2 % — ABNORMAL LOW (ref 36.0–46.0)
Hemoglobin: 9.9 g/dL — ABNORMAL LOW (ref 12.0–15.0)
Lymphocytes Relative: 15 %
Lymphs Abs: 1 10*3/uL (ref 0.7–4.0)
MCH: 29.6 pg (ref 26.0–34.0)
MCHC: 31.7 g/dL (ref 30.0–36.0)
MCV: 93.1 fL (ref 80.0–100.0)
Monocytes Absolute: 1 10*3/uL (ref 0.1–1.0)
Monocytes Relative: 15 %
Neutro Abs: 4 10*3/uL (ref 1.7–7.7)
Neutrophils Relative %: 63 %
Platelets: 299 10*3/uL (ref 150–400)
RBC: 3.35 MIL/uL — ABNORMAL LOW (ref 3.87–5.11)
RDW: 23.4 % — ABNORMAL HIGH (ref 11.5–15.5)
WBC: 6.4 10*3/uL (ref 4.0–10.5)
nRBC: 3.8 % — ABNORMAL HIGH (ref 0.0–0.2)
nRBC: 7 /100{WBCs} — ABNORMAL HIGH

## 2023-07-22 LAB — MAGNESIUM: Magnesium: 1.8 mg/dL (ref 1.7–2.4)

## 2023-07-22 LAB — CK: Total CK: 398 U/L — ABNORMAL HIGH (ref 38–234)

## 2023-07-22 LAB — PHOSPHORUS: Phosphorus: 5.1 mg/dL — ABNORMAL HIGH (ref 2.5–4.6)

## 2023-07-22 MED ORDER — POTASSIUM CHLORIDE CRYS ER 20 MEQ PO TBCR
40.0000 meq | EXTENDED_RELEASE_TABLET | Freq: Once | ORAL | Status: AC
  Administered 2023-07-22: 40 meq via ORAL
  Filled 2023-07-22: qty 2

## 2023-07-22 NOTE — Progress Notes (Signed)
 PROGRESS NOTE   Subjective/Complaints:  No issues overnite , feet are sensitive mainly plantar surface  States she can read small print ROS: Patient denies fever, new vision changes, nausea, vomiting, diarrhea,  shortness of breath or chest pain, headache + burning pain both feet    Objective:   No results found. Recent Labs    07/22/23 0251  WBC 6.4  HGB 9.9*  HCT 31.2*  PLT 299   Recent Labs    07/22/23 0251  NA 131*  K 3.4*  CL 106  CO2 15*  GLUCOSE 95  BUN 23*  CREATININE 0.52  CALCIUM 9.4    Intake/Output Summary (Last 24 hours) at 07/22/2023 1328 Last data filed at 07/22/2023 0843 Gross per 24 hour  Intake 890.83 ml  Output 500 ml  Net 390.83 ml        Physical Exam: Vital Signs Blood pressure 105/68, pulse (!) 106, temperature 98.2 F (36.8 C), resp. rate 18, height 5\' 9"  (1.753 m), weight (!) 149 kg, SpO2 100%.    General: No acute distress Mood and affect are appropriate Heart: Regular rate and rhythm no rubs murmurs or extra sounds Lungs: Clear to auscultation, breathing unlabored, no rales or wheezes Abdomen: Positive bowel sounds, soft nontender to palpation, nondistended Extremities: No clubbing, cyanosis, or edema Skin: No evidence of breakdown, no evidence of rash   Musculoskeletal:  Cervical back: Neck supple.     Comments: Ue's 5-/5 B/L LE's HF 3-/5 at best B/L; KE/KF 4-/5 B/L; DF and PF 4/5- but limited by pain due to severe pain with light touch just in the feet Skin:    General: Skin is warm and dry.     Comments: Port accessed in R chest    Assessment/Plan: 1. Functional deficits which require 3+ hours per day of interdisciplinary therapy in a comprehensive inpatient rehab setting. Physiatrist is providing close team supervision and 24 hour management of active medical problems listed below. Physiatrist and rehab team continue to assess barriers to discharge/monitor  patient progress toward functional and medical goals  Care Tool:  Bathing    Body parts bathed by patient: Right arm, Left arm, Chest, Abdomen, Face   Body parts bathed by helper: Front perineal area, Buttocks, Right upper leg, Left upper leg, Right lower leg, Left lower leg     Bathing assist Assist Level: Maximal Assistance - Patient 24 - 49%     Upper Body Dressing/Undressing Upper body dressing   What is the patient wearing?: Hospital gown only    Upper body assist Assist Level: Total Assistance - Patient < 25%    Lower Body Dressing/Undressing Lower body dressing    Lower body dressing activity did not occur: Environmental limitations (no clothes available)       Lower body assist       Toileting Toileting    Toileting assist Assist for toileting: Dependent - Patient 0%     Transfers Chair/bed transfer  Transfers assist     Chair/bed transfer assist level: Dependent - mechanical lift     Locomotion Ambulation   Ambulation assist   Ambulation activity did not occur: Safety/medical concerns  Walk 10 feet activity   Assist  Walk 10 feet activity did not occur: Safety/medical concerns        Walk 50 feet activity   Assist Walk 50 feet with 2 turns activity did not occur: Safety/medical concerns         Walk 150 feet activity   Assist Walk 150 feet activity did not occur: Safety/medical concerns         Walk 10 feet on uneven surface  activity   Assist Walk 10 feet on uneven surfaces activity did not occur: Safety/medical concerns         Wheelchair     Assist Is the patient using a wheelchair?: No             Wheelchair 50 feet with 2 turns activity    Assist            Wheelchair 150 feet activity     Assist          Blood pressure 105/68, pulse (!) 106, temperature 98.2 F (36.8 C), resp. rate 18, height 5\' 9"  (1.753 m), weight (!) 149 kg, SpO2 100%.  Medical Problem List and  Plan: 1. 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 If unaccessed?             -ELOS/Goals: min A hopefully             -Continue CIR 2.  Antithrombotics: -DVT/anticoagulation:  Pharmaceutical: Lovenox              -antiplatelet therapy: N/A 3. Pain Management: Has been using oxycodone  prn past 3 days? For foot, leg and back pain- also has Gabapentin  800mg  TID - duloxetine  stopped initially due to myositis- 30 mg resumed 4/23 4. Mood/Behavior/Sleep: LCSW to follow for evaluation and support.              -antipsychotic agents: N/A  -Neuropsych consult 5. Neuropsych/cognition: This patient is capable of making decisions on her own behalf. 6. Skin/Wound Care: Routine pressure relief measures.  7. Fluids/Electrolytes/Nutrition: Monitor I/O. Continue Ensure supplements hypoK+ KCL 40meq po x 1- recheck 4/28 8. Non-specific myositis: Cymbalta  d/c by neurology due to concerns of rhabdomyolysis --30 mg resumed 04/23 given improving myositis.  --Monitor CK intermittently improved from 1536-->514 --consider muscle biopsy if symptoms do not improve 9. Multifocal PNA: Likely aspiration--most significant in lingula and RLL on CT w/o contrast chest 07/18/23 --Hx of chronic intermittent N/V. May need swallow evaluation. Swallow precautions added.   --started on Augmentin  04/22-->04/25 for 5 day course.  11. Multiple Vitamin deficiencies: Vitamin B1  250 mg daily 4/22-->4/28 followed by 100 mg daily IV.              --now on B12 IM, IV folate. Vitamin A  10,000 units daily. Vitamin D  50,000/wk             --recheck Mg, Phos (supplemented) 12. Resting tachycardia: HR has been 110-120 range at baseline --not on any BB -Start metoprolol  12.5 mg BID 13. Accelerated HTN: BP poorly controlled.  Continue hydrochlorothiazide  and Avapro  -- addition of BB? -4/25 BB started as in #12    07/22/2023   11:58 AM 07/22/2023    4:13 AM 07/22/2023    4:06 AM  Vitals with BMI  Systolic 105  108  Diastolic 68  79  Pulse 106 108 116    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              --IVIG 04/21- 04/24. Followed by Dr. Curley Double. 15. Urinary retention: Purewick being used--monitor voiding with PVR/bladder scan. Toilet every 4 hours  -4/25 PVR 64, continue to monitor  16. Chronic Asthma: Continue Dulera  BID and singulair  --Followed by Dr. Idolina Maker.  17. H/o chronic gastritis w/nausea and vomiting: D/c Ibuprofen . Continue Protonix  40 mg and Pepcid  -- Followed by [facility].  18. Abnormal LFTs: Normal liver parenchyma and GB sludge noted on abdominal ultrasound 07/16/23.  --Resolving. Recheck in am-pending 19. Peripheral polyneuropathy: Cymbalta  resumed 04/23 as CK trending down. Will check weekly --Gabapentin  600 mg TID 4/25 increase gabapentin  to 800mg  TID, consider trying lyrica instead. Qutenza may be option outpatient. She says she had visit in PM&R clinic planned in may (Dr. Rayleen Cal) to discuss her pain, not sure pt can tolerate capsaicin patch on feet 18. Anemia of chronic disease: Likely malabsorption w/ Low iron but elevated TIBC/Ferritin  -Repeat labs pending  19. Morbid obesity: BMI 48. Followed by RD/Bariatric nutrition for wt loss. Plan for Bariatric surgery 08/2023.              --Now on aggressive vitamin supplementation.  20. Hyponatremia: Na down to 132-->question due to IVIG 4/31-4/24.  --May see rise in LFTs again.  -5/25 recheck labs still pending, will ask nursing to call to check on this 21. Severe tricuspid regurgitation on ECHO        LOS: 2 days A FACE TO FACE EVALUATION WAS PERFORMED  Tiffany Velasquez 07/22/2023, 1:28 PM

## 2023-07-22 NOTE — Progress Notes (Signed)
 Physical Therapy Session Note  Patient Details  Name: Tiffany Velasquez MRN: [id number] Date of Birth: 02/26/1996  Today's Date: 07/22/2023 PT Individual Time: 0925-1018 PT Individual Time Calculation (min): 53 min   Short Term Goals: Week 1:  PT Short Term Goal 1 (Week 1): Pt will roll side to side w/ supervision. PT Short Term Goal 2 (Week 1): Pt will transfer sup to sit w/ min A consistently and safely. PT Short Term Goal 3 (Week 1): Pt will transfer sit to stand from elevated surface w/ mod A +1 consistently. PT Short Term Goal 4 (Week 1): PT to assess gait.  Skilled Therapeutic Interventions/Progress Updates: Patient supine in bed following personal care with nsg (missed time charted) on entrance to room. Patient alert and agreeable to PT session.   Pt reported unrated pain in posterior neck (manual therapy performed to assist during session). Pt reported previous day of therapy was a lot, but in better spirits today. Pt performed supine<>sit with maxA and VC for sequence. Pt scooted to EOB with modA. Pt sitting EOB to stand to STEDY in order for PTA and rehab tech to donn brief. Pt required increased encouragement and cues to stand with anterior weight shift and hand/hip sequence. Pt required 2 attempts to stand (able to do so following cue to bring nose over STEDY front bar). Pt stood with maxA + 2 and unable to extend at hips and remained forward flexed and required seated rest due to reports of B LE fatigued. Pt attempted another stand with 3rd person and PTA in front to assist with hip extension in standing. PTA provided active listening and encouragement as pt began to feel discouraged. Pt stood halfway and required to sit back to EOB. Pt transitioned back to bed and required maxA + 2 to scoot to [facility] in trendelenburg (pt cued to bend knees and assist). Pt performed supine<>R/L sidelying to donn brief with cues for bed features and minA + 2. Pt supine in bed with PTA performing manual therapy  to B upper traps musculature and L posterior paraspinals (trigger point release and STM). Pt reported decrease in pain. Pt provided with hot pack to B upper traps and towel on skin.  Patient supine in bed at end of session with brakes locked, and all needs within reach.      Therapy Documentation Precautions:  Precautions Precautions: Fall Recall of Precautions/Restrictions: Intact Precaution/Restrictions Comments: 6 falls recently Restrictions Weight Bearing Restrictions Per Provider Order: No   Therapy/Group: Individual Therapy  Tylor Courtwright PTA 07/22/2023, 12:15 PM

## 2023-07-22 NOTE — Progress Notes (Signed)
 Patient has slept well this shift. She is calm and cooperative. She has had no tearful episodes. She did report pain to her feet early this morning and was given PRN tylenol  and repositioned. No other concerns at this time.

## 2023-07-22 NOTE — Progress Notes (Signed)
 Physical Therapy Session Note  Patient Details  Name: Tiffany Velasquez MRN: [id number] Date of Birth: [birth year]  Today's Date: 07/22/2023 PT Individual Time: 1420-1445 PT Individual Time Calculation (min): 25 min   Short Term Goals: Week 1:  PT Short Term Goal 1 (Week 1): Pt will roll side to side w/ supervision. PT Short Term Goal 2 (Week 1): Pt will transfer sup to sit w/ min A consistently and safely. PT Short Term Goal 3 (Week 1): Pt will transfer sit to stand from elevated surface w/ mod A +1 consistently. PT Short Term Goal 4 (Week 1): PT to assess gait.  Skilled Therapeutic Interventions/Progress Updates:    Pt presents in room asleep in bed, awakens easily and agreeable to PT. Pt reporting pain in bilateral feet throughout session, tender to touch. Session focused on therapeutic activities with education on bed mobility, transfers, and WC positioning to increase comfort and tolerance to OOB. Therapist retrieves 22" TIS WC however may benefit from wider WC if available, requires TIS function due to poor trunk and neck control. Pt completes rolling in bed with min assist and hospital bed features. Therapist requested Kreg bed through charge nurse to allow for various positioning as well as standing due to decondiitoning. Pt tolerates transfer via maxi move dependently to TIS WC. Pt positioned for comfort with towels placed on bilateral foot plates due to increased pain in BLEs. Pt handoff in room to OT at end of session.  Therapy Documentation Precautions:  Precautions Precautions: Fall Recall of Precautions/Restrictions: Intact Precaution/Restrictions Comments: 6 falls recently Restrictions Weight Bearing Restrictions Per Provider Order: No   Therapy/Group: Individual Therapy  Annia Kilts PT, DPT 07/22/2023, 3:04 PM

## 2023-07-22 NOTE — Progress Notes (Signed)
 Occupational Therapy Session Note  Patient Details  Name: Tiffany Velasquez MRN: [id number] Date of Birth: Aug 12, 1995  Today's Date: 07/22/2023 OT Individual Time: 1100-1200 OT Individual Time Calculation (min): 60 min    Short Term Goals: Week 1:  OT Short Term Goal 1 (Week 1): Pt will transfer with LRAD with mod +2 to [facility] OT Short Term Goal 2 (Week 1): Pt will consistenly get to EOB with bed rail and features wtih min guard in prep for ADL OT Short Term Goal 3 (Week 1): Pt will don shirt with setup OT Short Term Goal 4 (Week 1): Pt will thread LB clothing with mod A  Skilled Therapeutic Interventions/Progress Updates:    Pt greeted semi-reclined in bed with eyes closed. Decreased alertness overall, but able to get pt to participate with Tiffany multimodal cues and encouragement to bathe and get dressed since mom dropped off clothing. Pt needed 3 trials and Tiffany/total A +2 to get to sitting EOB. Pt with poor head control and difficulty keeping head/neck up at EOB. UB bathing/dressing from EOB with Tiffany A and +2 needed for support in sitting. Pt returned  UB to supported elevated HOB to rest head/neck and core multiple times while seated EOB. She would often not give a warning and just let herself fall over to the R. Total A to thread pant legs at EOB, then pt became tearful when [place] brought over. Pt needed encouragement to attempt stand, but tearful and unable to power up at all. Pt returned to bed and attempted supported hip bridge to get up pants but unsuccessful. Tiffany+2 rolling in bed to pull pants over hips with total A. Bed placed in trendelenburg to get pt back up in bed. Pt closing eyes, fatigued, but agreeable to try UB there-ex. Pt completed 3 sets of 10 chest press, straight arm raise and bicep curl using yellow theraband. Pt left semi-reclined in bed at end of session with bed alarm on, call bell in reach, and needs met.   Therapy Documentation Precautions:  Precautions Precautions:  Fall Recall of Precautions/Restrictions: Intact Precaution/Restrictions Comments: 6 falls recently Restrictions Weight Bearing Restrictions Per Provider Order: No Pain:  7/10 pain in B feet neuropothy, rest and repositioned   Therapy/Group: Individual Therapy  Lethia Raveling 07/22/2023, 11:43 AM

## 2023-07-22 NOTE — Progress Notes (Signed)
 Occupational Therapy Session Note  Patient Details  Name: Tiffany Velasquez MRN: [id number] Date of Birth: April 17, 1995  Today's Date: 07/22/2023 OT Individual Time: 1445-1530 OT Individual Time Calculation (min): 45 min    Short Term Goals: Week 1:  OT Short Term Goal 1 (Week 1): Pt will transfer with LRAD with mod +2 to [facility] OT Short Term Goal 2 (Week 1): Pt will consistenly get to EOB with bed rail and features wtih min guard in prep for ADL OT Short Term Goal 3 (Week 1): Pt will don shirt with setup OT Short Term Goal 4 (Week 1): Pt will thread LB clothing with mod A  Skilled Therapeutic Interventions/Progress Updates:    Pt greeted for second OT session after  just getting out of the bed using Maximove to TIS wc. Handoff to OT. OT showed pt around the unit encouraging her for participation in rehabilitation program. Pt thankful to be out of bed and out of room. Pt returned to room and worked on self-feeding from [employer]. Pt needed max encouragement to take bites and was very slow to chew and swallow food, but eventually did. Addressed LB there-ex from TIS wc with 3 sets of 10 knee extension with rest breaks in between. 3 sets of 10 arm raises.Pt wanted to stay OOB in TIS wc and was left with alarm belt on, call bell in reach and needs met.   Therapy Documentation Precautions:  Precautions Precautions: Fall Recall of Precautions/Restrictions: Intact Precaution/Restrictions Comments: 6 falls recently Restrictions Weight Bearing Restrictions Per Provider Order: No Pain:  7-10 B foot pain. Rest and repositioned. Nursing administered meds   Therapy/Group: Individual Therapy  Lethia Raveling 07/22/2023, 2:46 PM

## 2023-07-23 DIAGNOSIS — E569 Vitamin deficiency, unspecified: Secondary | ICD-10-CM

## 2023-07-23 DIAGNOSIS — R195 Other fecal abnormalities: Secondary | ICD-10-CM

## 2023-07-23 DIAGNOSIS — Q998 Other specified chromosome abnormalities: Secondary | ICD-10-CM | POA: Diagnosis not present

## 2023-07-23 DIAGNOSIS — I10 Essential (primary) hypertension: Secondary | ICD-10-CM | POA: Diagnosis not present

## 2023-07-23 DIAGNOSIS — E871 Hypo-osmolality and hyponatremia: Secondary | ICD-10-CM | POA: Diagnosis not present

## 2023-07-23 LAB — ALDOSTERONE + RENIN ACTIVITY W/ RATIO
ALDO / PRA Ratio: <3.2 (ref 0.0–30.0)
Aldosterone: <2 ng/dL (ref 0.0–30.0)
PRA LC/MS/MS: 0.621 ng/mL/h (ref 0.167–5.380)

## 2023-07-23 MED ORDER — POLYETHYLENE GLYCOL 3350 17 G PO PACK
17.0000 g | PACK | Freq: Every day | ORAL | Status: DC | PRN
  Administered 2023-08-05: 17 g via ORAL
  Filled 2023-07-23: qty 1

## 2023-07-23 MED ORDER — CHLORHEXIDINE GLUCONATE CLOTH 2 % EX PADS
6.0000 | MEDICATED_PAD | Freq: Two times a day (BID) | CUTANEOUS | Status: DC
  Administered 2023-07-23 – 2023-08-15 (×39): 6 via TOPICAL

## 2023-07-23 MED ORDER — FOLIC ACID 1 MG PO TABS
1.0000 mg | ORAL_TABLET | Freq: Every day | ORAL | Status: DC
  Administered 2023-07-23 – 2023-08-16 (×25): 1 mg via ORAL
  Filled 2023-07-23 (×26): qty 1

## 2023-07-23 NOTE — Progress Notes (Signed)
 PHARMACIST - PHYSICIAN COMMUNICATION  DR:   Dorn Gaskins  CONCERNING: IV to Oral Route Change Policy  RECOMMENDATION: This patient is receiving folic acid  by the intravenous route.  Based on criteria approved by the Pharmacy and Therapeutics Committee, the intravenous medication(s) is/are being converted to the equivalent oral dose form(s).   DESCRIPTION: These criteria include: The patient is eating (either orally or via tube) and/or has been taking other orally administered medications for a least 24 hours The patient has no evidence of active gastrointestinal bleeding or impaired GI absorption (gastrectomy, short bowel, patient on TNA or NPO).  If you have questions about this conversion, please contact the Pharmacy Department  []   [phone] )  Cristine Done []   [phone] )  [facility] [x]   [phone] )  Arlin Benes []   [phone] )  [facility] []   [phone] )  [facility]   [place], PharmD, [place], AAHIVP, CPP Infectious Disease Pharmacist 07/23/2023 12:40 PM

## 2023-07-23 NOTE — IPOC Note (Signed)
 Overall Plan of Care [facility]) Patient Details Name: Tiffany Velasquez MRN: [id number] DOB: 1995/11/16  Admitting Diagnosis: Myositis associated antibody positive  Hospital Problems: Principal Problem:   Myositis associated antibody positive Active Problems:   Myositis     Functional Problem List: Nursing Behavior, Bladder, Bowel, Edema, Endurance, Medication Management, Pain, Safety, Sensory, Nutrition, Perception, Motor  PT Balance, Endurance, Motor  OT Balance, Pain, Endurance, Motor, Safety, Sensory  SLP    TR         Basic ADL's: OT Bathing, Dressing, Toileting, Grooming     Advanced  ADL's: OT       Transfers: PT Bed Mobility, Bed to Chair, Car, Occupational psychologist, Research scientist (life sciences): PT Ambulation, Stairs, Psychologist, prison and probation services     Additional Impairments: OT None  SLP        TR      Anticipated Outcomes Item Anticipated Outcome  Self Feeding n/a  Swallowing      Basic self-care  min to mod A  Toileting  mod A   Bathroom Transfers min A  Bowel/Bladder  manage bowel with medications/ continent of bladder  Transfers  CGA  Locomotion  CGA  Communication     Cognition     Pain  <4 w/ prns  Safety/Judgment  manage safety with supervision assistance   Therapy Plan: PT Intensity: Minimum of 1-2 x/day ,45 to 90 minutes PT Frequency: 5 out of 7 days PT Duration Estimated Length of Stay: 4 weeks OT Intensity: Minimum of 1-2 x/day, 45 to 90 minutes OT Frequency: 5 out of 7 days OT Duration/Estimated Length of Stay: ~4 weeks     Team Interventions: Nursing Interventions Patient/Family Education, Disease Management/Prevention, Discharge Planning, Bladder Management, Pain Management, Psychosocial Support, Bowel Management, Medication Management  PT interventions Ambulation/gait training, Community reintegration, Neuromuscular re-education, Stair training, UE/LE Strength taining/ROM, Wheelchair propulsion/positioning, Therapeutic Activities,  UE/LE Coordination activities, Discharge planning, Functional mobility training, Therapeutic Exercise, Patient/family education  OT Interventions Self Care/advanced ADL retraining, Therapeutic Activities, UE/LE Coordination activities, Pain management, Discharge planning, Balance/vestibular training, Disease mangement/prevention, Functional mobility training, Patient/family education, Skin care/wound managment, Therapeutic Exercise, Community reintegration, Visual/perceptual remediation/compensation, DME/adaptive equipment instruction, Neuromuscular re-education, Psychosocial support, UE/LE Strength taining/ROM, Wheelchair propulsion/positioning  SLP Interventions    TR Interventions    SW/CM Interventions     Barriers to Discharge MD  Medical stability, Incontinence, Weight, and pain  Nursing Decreased caregiver support, Home environment access/layout, Weight, Behavior, Nutrition means Discharge: House  Discharge Home Layout: One level  Discharge Home Access: Stairs to enter  Entrance Stairs-Rails: Left  Entrance Stairs-Number of Steps: 3  PT Inaccessible home environment    OT      SLP      SW       Team Discharge Planning: Destination: PT-Home ,OT- Home , SLP-  Projected Follow-up: PT-Home health PT, OT-  Home health OT, SLP-  Projected Equipment Needs: PT-To be determined, OT- To be determined, SLP-  Equipment Details: PT-pt uses SPC or RW of uncles as needed., OT-  Patient/family involved in discharge planning: PT- Patient,  OT-Patient, SLP-   MD ELOS: 4 weeks Medical Rehab Prognosis:  Good Assessment: The patient has been admitted for CIR therapies with the diagnosis of myositis associated with Vit D deficiency, 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 The team will be addressing functional mobility, strength, stamina, balance, safety, adaptive techniques and equipment, self-care, bowel and bladder mgt, patient and caregiver education. Goals have been set at min A. Anticipated  discharge  destination is home.        See Team Conference Notes for weekly updates to the plan of care

## 2023-07-23 NOTE — Progress Notes (Signed)
 PROGRESS NOTE   Subjective/Complaints:  Pt doing ok, but slept so-so because she couldn't get into a good position of comfort. Working with nursing to find good position at night. Pain doing ok but reports mostly plantar aspect of feet are painful/sensitive. Gabapentin  just increased and restarted cymbalta , so she is hopeful those will help. Nursing noted that the lidocaine  cream for her feet comes in a very small 5g tube, not really enough for her feet. Will ask pharmacy to send more tubes down per dose.  LBM yesterday, very runny, will make miralax  PRN instead and pt agreeable with that. Says "it's the food here" too. Urinating fine. Denies any other complaints or concerns today.   ROS: as per HPI. Denies CP, SOB, abd pain, N/V/D/C, or any other complaints at this time.   + burning pain both feet    Objective:   No results found. Recent Labs    07/22/23 0251  WBC 6.4  HGB 9.9*  HCT 31.2*  PLT 299   Recent Labs    07/22/23 0251  NA 131*  K 3.4*  CL 106  CO2 15*  GLUCOSE 95  BUN 23*  CREATININE 0.52  CALCIUM 9.4    Intake/Output Summary (Last 24 hours) at 07/23/2023 1140 Last data filed at 07/22/2023 1752 Gross per 24 hour  Intake 120 ml  Output --  Net 120 ml        Physical Exam: Vital Signs Blood pressure 127/72, pulse (!) 109, temperature 98.8 F (37.1 C), temperature source Oral, resp. rate 18, height 5\' 9"  (1.753 m), weight (!) 149 kg, SpO2 99%.    General: NAD, sitting up in w/c HEENT: Head is normocephalic, atraumatic, MMM Neck: Supple without JVD or lymphadenopathy Heart: Tachycardia, no m/r/g appreciated Chest: CTA bilaterally without wheezes, rales, or rhonchi; no distress. Port in R chest Abdomen: Soft, non-tender, non-distended, bowel sounds positive. Extremities: No clubbing, cyanosis, or edema. Pulses are 2+ Psych: Pt's affect is flat. Pt is cooperative Skin: Clean and intact without  signs of breakdown over exposed surfaces.  Neuro:  Alert and oriented x4, b/l Feet very tender  PRIOR EXAMS:  Musculoskeletal:  Cervical back: Neck supple.     Comments: Ue's 5-/5 B/L LE's HF 3-/5 at best B/L; KE/KF 4-/5 B/L; DF and PF 4/5- but limited by pain due to severe pain with light touch just in the feet Skin:    General: Skin is warm and dry.     Comments: Port accessed in R chest    Assessment/Plan: 1. Functional deficits which require 3+ hours per day of interdisciplinary therapy in a comprehensive inpatient rehab setting. Physiatrist is providing close team supervision and 24 hour management of active medical problems listed below. Physiatrist and rehab team continue to assess barriers to discharge/monitor patient progress toward functional and medical goals  Care Tool:  Bathing    Body parts bathed by patient: Right arm, Left arm, Chest, Abdomen, Face   Body parts bathed by helper: Front perineal area, Buttocks, Right upper leg, Left upper leg, Right lower leg, Left lower leg     Bathing assist Assist Level: Maximal Assistance - Patient 24 - 49%  Upper Body Dressing/Undressing Upper body dressing   What is the patient wearing?: Hospital gown only    Upper body assist Assist Level: Total Assistance - Patient < 25%    Lower Body Dressing/Undressing Lower body dressing    Lower body dressing activity did not occur: Environmental limitations (no clothes available)       Lower body assist       Toileting Toileting    Toileting assist Assist for toileting: Dependent - Patient 0%     Transfers Chair/bed transfer  Transfers assist     Chair/bed transfer assist level: Dependent - mechanical lift     Locomotion Ambulation   Ambulation assist   Ambulation activity did not occur: Safety/medical concerns          Walk 10 feet activity   Assist  Walk 10 feet activity did not occur: Safety/medical concerns        Walk 50 feet  activity   Assist Walk 50 feet with 2 turns activity did not occur: Safety/medical concerns         Walk 150 feet activity   Assist Walk 150 feet activity did not occur: Safety/medical concerns         Walk 10 feet on uneven surface  activity   Assist Walk 10 feet on uneven surfaces activity did not occur: Safety/medical concerns         Wheelchair     Assist Is the patient using a wheelchair?: No             Wheelchair 50 feet with 2 turns activity    Assist            Wheelchair 150 feet activity     Assist          Blood pressure 127/72, pulse (!) 109, temperature 98.8 F (37.1 C), temperature source Oral, resp. rate 18, height 5\' 9"  (1.753 m), weight (!) 149 kg, SpO2 99%.  Medical Problem List and Plan: 1. 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 If unaccessed?             -ELOS/Goals: 2-4 weeks? min A hopefully             -Continue CIR 2.  Antithrombotics: -DVT/anticoagulation:  Pharmaceutical: Lovenox  70mg  daily             -antiplatelet therapy: N/A 3. Pain Management: Has been using oxycodone  prn past 3 days? For foot, leg and back pain- also has Gabapentin  800mg  TID (increased 4/25) - duloxetine  stopped initially due to myositis- 30 mg daily resumed 4/23 -07/23/23 feet very sensitive, but cymbalta  and gabapentin  recently adjusted, lidocaine  cream not enough so asked that pharmacy send 15g tubes per treatment, to adequately cover the plantar surfaces of her feet to see if we can get relief.  4. Mood/Behavior/Sleep: LCSW to follow for evaluation and support.              -antipsychotic agents: N/A  -Neuropsych consult  -07/23/23 didn't sleep well, has melatonin PRN to use 5. Neuropsych/cognition: This patient is  capable of making decisions on her own behalf. 6. Skin/Wound Care: Routine pressure relief measures.  7. Fluids/Electrolytes/Nutrition: Monitor I/O. Continue Ensure supplements and vitamin supplementation -07/22/23 hypoK+ 3.4, KCL 40meq  po x 1- recheck 4/28 -07/23/23 added labs for tomorrow (Mg/phos, CMP, CK) 8. Non-specific myositis: Cymbalta  d/c by neurology due to concerns of rhabdomyolysis --Cymbalta  30 mg resumed 04/23 given improving myositis.  --Monitor CK intermittently improved from 1536-->514--> 398 --consider muscle biopsy if symptoms do not improve -07/23/23 CK to be done tomorrow and weekly on Mon 9. Multifocal PNA: Likely aspiration--most significant in lingula and RLL on CT w/o contrast chest 07/18/23 --Hx of chronic intermittent N/V. May need swallow evaluation. Swallow precautions added.   --started on Augmentin  04/22-->04/25 for 5 day course.  11. Multiple Vitamin deficiencies: Vitamin B1  250 mg daily 4/22-->4/28 followed by 100 mg daily IV.  --now on B12 IM+PO, IV folate. Vitamin A  10,000 units daily. Vitamin D  50,000/wk, and MVI             --recheck Mg, Phos (supplemented)-- ordered for Monday 4/28 12. Resting tachycardia: HR has been 110-120 range at baseline --not on any BB -Start metoprolol  12.5 mg BID -07/23/23 still tachycardic but less, monitor 13. Accelerated HTN: BP poorly controlled. Continue hydrochlorothiazide  12.5mg  daily and Avapro  150mg  daily -- addition of BB? -4/25 BB started as in #12 -07/23/23 BPs better, monitor; of note, pharmacy note states pt was on amlodipine  previously? 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 Latanoprost  and alphagan  gtts             --IVIG 04/21- 04/24. Followed by Dr. Curley Double. -07/23/23 humira, retinA, and rhopressa  listed in pharmacy note as "resume as needed in CIR or at discharge"; clarify when/if these need to be restarted while here 15. Urinary retention: Purewick being used--monitor voiding with PVR/bladder scan. Toilet every 4 hours  -4/25 PVR 64, continue to monitor   -07/23/23 no PVRs documented overnight 16. Chronic Asthma: Continue Dulera  BID and singulair  10mg  nightly, azelastin BID --Followed by Dr. Idolina Maker.  17. H/o chronic gastritis w/nausea and vomiting: D/c Ibuprofen . Continue Protonix  40 mg daily and Pepcid  40mg  nightly; was on reglan  at home, has PRN compazine here. Carafate  1g BID.  -- Followed by [facility].  18. Abnormal LFTs: Normal liver parenchyma and GB sludge noted on abdominal ultrasound 07/16/23.  --Resolving. Recheck in am-pending -07/23/23 LFTs a bit improved 4/26, repeat CMP tomorrow morning; of note, HepB SAb reactive, core Ab positive 19. Peripheral polyneuropathy: Cymbalta  resumed 04/23 as CK trending down. Will check weekly --Gabapentin  600 mg TID 4/25 increase gabapentin  to 800mg  TID, consider trying lyrica instead. Qutenza may be option outpatient. She says she had visit in PM&R clinic planned in may (Dr. Rayleen Cal) to discuss her pain, not sure pt can tolerate capsaicin patch on feet -07/23/23 see #3 above, asked pharmacy to send more lidocaine  cream per dose.  18. Anemia of chronic disease: Likely malabsorption w/ Low iron but elevated TIBC/Ferritin -07/23/23 Hgb 9.9 on Fri, repeat weekly on Mon/Thurs; iron studies done 4/21 showing low TIBC 172, iron 46, ferritin elevated 791.  19. Morbid obesity: BMI 48. Followed by RD/Bariatric nutrition for wt loss. Plan for Bariatric surgery 08/2023.              --  Now on aggressive vitamin supplementation as above 20. Hyponatremia: Na  down to 132-->question due to IVIG 4/31-4/24.  --May see rise in LFTs again.  -4/25 recheck labs still pending, will ask nursing to call to check on this -07/23/23 Na 131 on 4/26, recheck tomorrow to see trend or need for supplementation  21. 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 Gout: continue allopurinol  100mg  QD; off colchicine  for now, resume as appropriate  23. Diarrhea: -07/23/23 pt now having runny stools, thinks it's the food; stop miralax  daily and change to PRN; monitor      LOS: 3 days A FACE TO FACE EVALUATION WAS PERFORMED  [address] Mackie Goon 07/23/2023, 11:40 AM

## 2023-07-23 NOTE — Plan of Care (Signed)
  Problem: RH BOWEL ELIMINATION Goal: RH STG MANAGE BOWEL WITH ASSISTANCE Description: STG Manage Bowel with supervision Assistance. Outcome: Progressing   Problem: RH BLADDER ELIMINATION Goal: RH STG MANAGE BLADDER WITH ASSISTANCE Description: STG Manage Bladder With supervision Assistance Outcome: Progressing   Problem: RH SKIN INTEGRITY Goal: RH STG SKIN FREE OF INFECTION/BREAKDOWN Description: Manage skin free of infection/breakdown with supervision Outcome: Progressing   Problem: RH SAFETY Goal: RH STG ADHERE TO SAFETY PRECAUTIONS W/ASSISTANCE/DEVICE Description: STG Adhere to Safety Precautions With supervision  Assistance/Device. Outcome: Progressing   Problem: RH PAIN MANAGEMENT Goal: RH STG PAIN MANAGED AT OR BELOW PT'S PAIN GOAL Description: <4 w/ prns Outcome: Progressing

## 2023-07-24 ENCOUNTER — Ambulatory Visit: Admitting: Neurology

## 2023-07-24 DIAGNOSIS — F4323 Adjustment disorder with mixed anxiety and depressed mood: Secondary | ICD-10-CM | POA: Diagnosis not present

## 2023-07-24 DIAGNOSIS — Q998 Other specified chromosome abnormalities: Secondary | ICD-10-CM | POA: Diagnosis not present

## 2023-07-24 LAB — COMPREHENSIVE METABOLIC PANEL WITH GFR
ALT: 69 U/L — ABNORMAL HIGH (ref 0–44)
AST: 108 U/L — ABNORMAL HIGH (ref 15–41)
Albumin: 2.3 g/dL — ABNORMAL LOW (ref 3.5–5.0)
Alkaline Phosphatase: 42 U/L (ref 38–126)
Anion gap: 10 (ref 5–15)
BUN: 20 mg/dL (ref 6–20)
CO2: 16 mmol/L — ABNORMAL LOW (ref 22–32)
Calcium: 9.1 mg/dL (ref 8.9–10.3)
Chloride: 106 mmol/L (ref 98–111)
Creatinine, Ser: 0.53 mg/dL (ref 0.44–1.00)
GFR, Estimated: >60 mL/min (ref >60)
Glucose, Bld: 94 mg/dL (ref 70–99)
Potassium: 2.9 mmol/L — ABNORMAL LOW (ref 3.5–5.1)
Sodium: 132 mmol/L — ABNORMAL LOW (ref 135–145)
Total Bilirubin: 0.5 mg/dL (ref 0.0–1.2)
Total Protein: 9.6 g/dL — ABNORMAL HIGH (ref 6.5–8.1)

## 2023-07-24 LAB — PHOSPHORUS: Phosphorus: 4.8 mg/dL — ABNORMAL HIGH (ref 2.5–4.6)

## 2023-07-24 LAB — BASIC METABOLIC PANEL WITH GFR
Anion gap: 9 (ref 5–15)
BUN: 22 mg/dL — ABNORMAL HIGH (ref 6–20)
CO2: 16 mmol/L — ABNORMAL LOW (ref 22–32)
Calcium: 9.3 mg/dL (ref 8.9–10.3)
Chloride: 108 mmol/L (ref 98–111)
Creatinine, Ser: 0.56 mg/dL (ref 0.44–1.00)
GFR, Estimated: >60 mL/min (ref >60)
Glucose, Bld: 101 mg/dL — ABNORMAL HIGH (ref 70–99)
Potassium: 3.4 mmol/L — ABNORMAL LOW (ref 3.5–5.1)
Sodium: 133 mmol/L — ABNORMAL LOW (ref 135–145)

## 2023-07-24 LAB — CBC
HCT: 30.6 % — ABNORMAL LOW (ref 36.0–46.0)
Hemoglobin: 9.5 g/dL — ABNORMAL LOW (ref 12.0–15.0)
MCH: 28.7 pg (ref 26.0–34.0)
MCHC: 31 g/dL (ref 30.0–36.0)
MCV: 92.4 fL (ref 80.0–100.0)
Platelets: 308 10*3/uL (ref 150–400)
RBC: 3.31 MIL/uL — ABNORMAL LOW (ref 3.87–5.11)
RDW: 23 % — ABNORMAL HIGH (ref 11.5–15.5)
WBC: 5.9 10*3/uL (ref 4.0–10.5)
nRBC: 2 % — ABNORMAL HIGH (ref 0.0–0.2)

## 2023-07-24 LAB — MAGNESIUM: Magnesium: 1.7 mg/dL (ref 1.7–2.4)

## 2023-07-24 LAB — CK: Total CK: 241 U/L — ABNORMAL HIGH (ref 38–234)

## 2023-07-24 MED ORDER — JUVEN PO PACK
1.0000 | PACK | Freq: Two times a day (BID) | ORAL | Status: DC
  Administered 2023-07-24 – 2023-08-13 (×14): 1 via ORAL
  Filled 2023-07-24 (×20): qty 1

## 2023-07-24 MED ORDER — HYDROCHLOROTHIAZIDE 12.5 MG PO TABS
6.2500 mg | ORAL_TABLET | Freq: Every day | ORAL | Status: DC
  Administered 2023-07-25 – 2023-07-27 (×3): 6.25 mg via ORAL
  Filled 2023-07-24 (×3): qty 1

## 2023-07-24 MED ORDER — IRBESARTAN 75 MG PO TABS
150.0000 mg | ORAL_TABLET | Freq: Every day | ORAL | Status: DC
  Administered 2023-07-25 – 2023-07-27 (×3): 150 mg via ORAL
  Filled 2023-07-24 (×3): qty 2

## 2023-07-24 MED ORDER — CALCIUM POLYCARBOPHIL 625 MG PO TABS
625.0000 mg | ORAL_TABLET | Freq: Every day | ORAL | Status: DC
  Administered 2023-07-24 – 2023-08-16 (×24): 625 mg via ORAL
  Filled 2023-07-24 (×24): qty 1

## 2023-07-24 MED ORDER — MAGNESIUM OXIDE -MG SUPPLEMENT 400 (240 MG) MG PO TABS: 400.0000 mg | ORAL_TABLET | Freq: Every day | ORAL | Status: DC

## 2023-07-24 MED ORDER — POTASSIUM CHLORIDE 10 MEQ/100ML IV SOLN
10.0000 meq | INTRAVENOUS | Status: AC
  Filled 2023-07-24 (×2): qty 100

## 2023-07-24 MED ORDER — POTASSIUM CHLORIDE 10 MEQ/100ML IV SOLN
10.0000 meq | INTRAVENOUS | Status: AC
  Administered 2023-07-24 (×4): 10 meq via INTRAVENOUS
  Filled 2023-07-24 (×4): qty 100

## 2023-07-24 MED ORDER — POTASSIUM CHLORIDE CRYS ER 20 MEQ PO TBCR: 40.0000 meq | EXTENDED_RELEASE_TABLET | Freq: Three times a day (TID) | ORAL | Status: DC

## 2023-07-24 MED ORDER — PROSOURCE PLUS PO LIQD
30.0000 mL | Freq: Two times a day (BID) | ORAL | Status: DC
  Administered 2023-07-24 – 2023-08-09 (×18): 30 mL via ORAL
  Filled 2023-07-24 (×27): qty 30

## 2023-07-24 MED ORDER — POTASSIUM CHLORIDE CRYS ER 20 MEQ PO TBCR
40.0000 meq | EXTENDED_RELEASE_TABLET | Freq: Once | ORAL | Status: AC
  Administered 2023-07-24: 40 meq via ORAL
  Filled 2023-07-24: qty 2

## 2023-07-24 MED ORDER — ALTEPLASE 2 MG IJ SOLR
2.0000 mg | Freq: Once | INTRAMUSCULAR | Status: AC
  Administered 2023-07-24: 2 mg
  Filled 2023-07-24: qty 2

## 2023-07-24 NOTE — Progress Notes (Signed)
 PROGRESS NOTE   Subjective/Complaints:  Heart rate mildly elevated overnight max 106, otherwise vitals are stable.  Ongoing complaints of neuropathic pain in her bilateral feet, 3-6 out of 10.  A.m. labs significant for stable hyponatremia, significant hypokalemia 2.9, elevated phosphorus 4.8, downtrending elevated LFTs, downtrending but elevated CK, and stable CBC.  1 incontinent of bladder overnight, multiple large liquid stools with intermittent continence.  Patient seen with her head covered in bed; wishes to sleep, denies any needs at this time.  No apparent pain or discomfort.  Continues with sensitivity in her feet, but does feel this is slightly better.  ROS: as per HPI. Denies CP, SOB, abd pain, N/V/D/C, or any other complaints at this time.   + burning pain both feet    Objective:   No results found. Recent Labs    07/22/23 0251 07/24/23 0220  WBC 6.4 5.9  HGB 9.9* 9.5*  HCT 31.2* 30.6*  PLT 299 308   Recent Labs    07/22/23 0251 07/24/23 0220  NA 131* 132*  K 3.4* 2.9*  CL 106 106  CO2 15* 16*  GLUCOSE 95 94  BUN 23* 20  CREATININE 0.52 0.53  CALCIUM 9.4 9.1    Intake/Output Summary (Last 24 hours) at 07/24/2023 0814 Last data filed at 07/23/2023 1300 Gross per 24 hour  Intake 236 ml  Output --  Net 236 ml        Physical Exam: Vital Signs Blood pressure 102/74, pulse (!) 109, temperature 98.1 F (36.7 C), resp. rate 16, height 5\' 9"  (1.753 m), weight (!) 149 kg, SpO2 96%.    General: NAD, laying in bed.  Obese. HEENT: Head is normocephalic, atraumatic, MMM Neck: Supple without JVD or lymphadenopathy Heart: Regular rate and rhythm, no m/r/g appreciated Chest: CTA bilaterally without wheezes, rales, or rhonchi; no distress.   Abdomen: Soft, non-tender, non-distended, bowel sounds positive. Extremities: No clubbing, cyanosis, or edema.  Psych: Pt's affect is flat. Pt is  cooperative Skin: Clean and intact without signs of breakdown over exposed surfaces.  Right chest port C-D-I Neuro:  Awake, alert, oriented x 4.  No apparent cognitive deficits. Good insight into current situation. Strength antigravity against resistance in all 4 extremities Hypersensitivity to light touch in bilateral feet/ankles.  Otherwise, sensation intact No apparent tone, no abnormal movements or tremors   Assessment/Plan: 1. Functional deficits which require 3+ hours per day of interdisciplinary therapy in a comprehensive inpatient rehab setting. Physiatrist is providing close team supervision and 24 hour management of active medical problems listed below. Physiatrist and rehab team continue to assess barriers to discharge/monitor patient progress toward functional and medical goals  Care Tool:  Bathing    Body parts bathed by patient: Right arm, Left arm, Chest, Abdomen, Face   Body parts bathed by helper: Front perineal area, Buttocks, Right upper leg, Left upper leg, Right lower leg, Left lower leg     Bathing assist Assist Level: Maximal Assistance - Patient 24 - 49%     Upper Body Dressing/Undressing Upper body dressing   What is the patient wearing?: Hospital gown only    Upper body assist Assist Level: Total Assistance - Patient <  25%    Lower Body Dressing/Undressing Lower body dressing    Lower body dressing activity did not occur: Environmental limitations (no clothes available)       Lower body assist       Toileting Toileting    Toileting assist Assist for toileting: Dependent - Patient 0%     Transfers Chair/bed transfer  Transfers assist     Chair/bed transfer assist level: Dependent - mechanical lift     Locomotion Ambulation   Ambulation assist   Ambulation activity did not occur: Safety/medical concerns          Walk 10 feet activity   Assist  Walk 10 feet activity did not occur: Safety/medical concerns        Walk  50 feet activity   Assist Walk 50 feet with 2 turns activity did not occur: Safety/medical concerns         Walk 150 feet activity   Assist Walk 150 feet activity did not occur: Safety/medical concerns         Walk 10 feet on uneven surface  activity   Assist Walk 10 feet on uneven surfaces activity did not occur: Safety/medical concerns         Wheelchair     Assist Is the patient using a wheelchair?: No             Wheelchair 50 feet with 2 turns activity    Assist            Wheelchair 150 feet activity     Assist          Blood pressure 102/74, pulse (!) 109, temperature 98.1 F (36.7 C), resp. rate 16, height 5\' 9"  (1.753 m), weight (!) 149 kg, SpO2 96%.  Medical Problem List and Plan: 1. 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 If unaccessed?             -ELOS/Goals: 2-4 weeks? min A hopefully             -Continue CIR 2.  Antithrombotics: -DVT/anticoagulation:  Pharmaceutical: Lovenox  70mg  daily             -antiplatelet therapy: N/A 3. Pain Management: Has been using oxycodone  prn past 3 days? For foot, leg and back pain- also has Gabapentin  800mg  TID (increased 4/25) - duloxetine  stopped initially due to myositis- 30 mg daily resumed 4/23 -07/23/23 feet very sensitive, but cymbalta  and gabapentin  recently adjusted, lidocaine  cream not enough so asked that pharmacy send 15g tubes per treatment, to adequately cover the plantar surfaces of her feet to see if we can get relief.  4-28: Remains very sensitive, does feel some improvement  4. Mood/Behavior/Sleep: LCSW to follow for evaluation and support.              -antipsychotic agents: N/A  -Neuropsych consult  -07/23/23 didn't sleep well, has melatonin PRN to  use  5. Neuropsych/cognition: This patient is capable of making decisions on her own behalf.  - 4-28: Dr. Cheryll Corti evaluated today; appreciate his professional assessment  6. Skin/Wound Care: Routine pressure relief measures.   - Chest port appears clean, routine dressing changes  7. Fluids/Electrolytes/Nutrition: Monitor I/O. Continue Ensure supplements and vitamin supplementation -07/22/23 hypoK+ 3.4, KCL 40meq  po x 1- recheck 4/28 -07/23/23 added labs for tomorrow (Mg/phos, CMP, CK) 4/28: Hypokalemia worsened to 2.9 status post 40 mill equivalents; add 40 mill equivalents KCl daily, +40 mill equivalents IV today.  Repeat BMP tonight and tomorrow a.m. Phos elevated, so we will avoid K-Phos--repeat potassium 3.4 at 6 PM.  8. Non-specific myositis: Cymbalta  d/c by neurology due to concerns of rhabdomyolysis --Cymbalta  30 mg resumed 04/23 given improving myositis.  --Monitor CK intermittently improved from 1536-->514--> 398 --consider muscle biopsy if symptoms do not improve -07/23/23 CK to be done tomorrow and weekly on Mon 4-28: CK downtrending, continue to trend  9. Multifocal PNA: Likely aspiration--most significant in lingula and RLL on CT w/o contrast chest 07/18/23 --Hx of chronic intermittent N/V. May need swallow evaluation. Swallow precautions added.   --started on Augmentin  04/22-->04/25 for 5 day course.  -No apparent signs of respiratory distress  11. Multiple Vitamin deficiencies: Vitamin B1  250 mg daily 4/22-->4/28 followed by 100 mg daily IV.  --now on B12 IM+PO, IV folate. Vitamin A  10,000 units daily. Vitamin D  50,000/wk, and MVI             --recheck Mg, Phos (supplemented)-- ordered for Monday 4/28--Phos improving, magnesium  stable  12. Resting tachycardia: HR has been 110-120 range at baseline --not on any BB -Start metoprolol  12.5 mg BID -07/23/23 still tachycardic but less, monitor 4-28: Single instance recorded, monitor  13. Accelerated HTN: BP poorly  controlled. Continue hydrochlorothiazide  12.5mg  daily and Avapro  150mg  daily -- addition of BB? -4/25 BB started as in #12 -07/23/23 BPs better, monitor; of note, pharmacy note states pt was on amlodipine  previously? 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 Latanoprost  and alphagan  gtts             --IVIG 04/21- 04/24. Followed by Dr. Curley Double. -07/23/23 humira, retinA, and rhopressa  listed in pharmacy note as "resume as needed in CIR or at discharge"; clarify when/if these need to be restarted while here 4-28: Will clarify with patient today--patient not engaging with provider, will reattempt tomorrow.  15. Urinary retention: Purewick being used--monitor voiding with PVR/bladder scan. Toilet every 4 hours  -4/25 PVR 64, continue to monitor   - 4-28: No recent PVRs documented, patient denies incontinence, monitor  16. Chronic Asthma: Continue Dulera  BID and singulair  10mg  nightly, azelastin BID --Followed by Dr. Idolina Maker.  17. H/o chronic gastritis w/nausea and vomiting: D/c Ibuprofen . Continue Protonix  40 mg daily and Pepcid  40mg  nightly; was on reglan  at home, has PRN compazine here. Carafate  1g BID.  -- Followed by [facility].  18. Abnormal LFTs: Normal liver parenchyma and GB sludge noted on abdominal ultrasound 07/16/23.  --Resolving. Recheck in am-pending -07/23/23 LFTs a bit improved 4/26, repeat CMP tomorrow morning; of note, HepB SAb reactive, core Ab positive 4-28: LFTs looking better  19. Peripheral polyneuropathy:  Cymbalta  resumed 04/23 as CK trending down. Will check weekly -- See #3 above; may benefit from transition to Lyrica or trial of Qutenza as outpatient.  Was establishing with Dr. Rayleen Cal  18. Anemia of chronic disease: Likely malabsorption w/ Low iron but elevated TIBC/Ferritin -07/23/23 Hgb 9.9 on Fri, repeat weekly on Mon/Thurs; iron studies done 4/21 showing low TIBC 172, iron 46, ferritin elevated 791.   19. Morbid obesity: BMI 48. Followed by RD/Bariatric nutrition for wt loss. Plan for Bariatric surgery 08/2023.              --Now on aggressive vitamin supplementation as above 20. Hyponatremia: Na down to 132-->question due to IVIG 4/31-4/24.  --May see rise in LFTs again.  -4/25 recheck labs still pending, will ask nursing to call to check on this -07/23/23 Na 131 on 4/26, recheck tomorrow to see trend or need for supplementation  4/28: NA stable 132.  21. 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 Gout: continue allopurinol  100mg  QD; off colchicine  for now, resume as appropriate  23. Diarrhea: -07/23/23 pt now having runny stools, thinks it's the food; stop miralax  daily and change to PRN; monitor 4-28: Ongoing liquid stool; encourage p.o. fluids, replete electrolytes as above, and adding fibercon supplemen\t      LOS: 4 days A FACE TO FACE EVALUATION WAS PERFORMED  Bea Lime 07/24/2023, 8:14 AM

## 2023-07-24 NOTE — Care Management (Signed)
 Inpatient Rehabilitation Center Individual Statement of Services  Patient Name:  Tiffany Velasquez  Date:  07/24/2023  Welcome to the [facility]patient [facility].  Our goal is to provide you with an individualized program based on your diagnosis and situation, designed to meet your specific needs.  With this comprehensive rehabilitation program, you will be expected to participate in at least 3 hours of rehabilitation therapies Monday-Friday, with modified therapy programming on the weekends.  Your rehabilitation program will include the following services:  Physical Therapy (PT), Occupational Therapy (OT), 24 hour per day rehabilitation nursing, Therapeutic Recreaction (TR), Psychology, Neuropsychology, Care Coordinator, Rehabilitation Medicine, Nutrition Services, Pharmacy Services, and Other  Weekly team conferences will be held on Tuesdays to discuss your progress.  Your Inpatient Rehabilitation Care Coordinator will talk with you frequently to get your input and to update you on team discussions.  Team conferences with you and your family in attendance may also be held.  Expected length of stay: 4 weeks    Overall anticipated outcome: Minimal Assistance  Depending on your progress and recovery, your program may change. Your Inpatient Rehabilitation Care Coordinator will coordinate services and will keep you informed of any changes. Your Inpatient Rehabilitation Care Coordinator's name and contact numbers are listed  below.  The following services may also be recommended but are not provided by the Inpatient Rehabilitation Center:  Driving Evaluations Home Health Rehabiltiation Services Outpatient Rehabilitation Services Vocational Rehabilitation   Arrangements will be made to provide these services after discharge if needed.  Arrangements include referral to agencies that provide these services.  Your insurance has been verified to be:  [employer]  Your primary doctor is:   Wilhemena Harbour  Pertinent information will be shared with your doctor and your insurance company.  Inpatient Rehabilitation Care Coordinator:  Kathey Pang [phone] or ([phone]  Information discussed with and copy given to patient by: Rennis Case, 07/24/2023, 11:19 AM

## 2023-07-24 NOTE — Progress Notes (Signed)
 Occupational Therapy Session Note  Patient Details  Name: Tiffany Velasquez MRN: [id number] Date of Birth: [birth year]  {CHL IP REHAB OT TIME CALCULATIONS:304400400}  {CHL IP REHAB OT TIME CALCULATIONS:304400400}  Short Term Goals: Week 1:  OT Short Term Goal 1 (Week 1): Pt will transfer with LRAD with mod +2 to [facility] OT Short Term Goal 2 (Week 1): Pt will consistenly get to EOB with bed rail and features wtih min guard in prep for ADL OT Short Term Goal 3 (Week 1): Pt will don shirt with setup OT Short Term Goal 4 (Week 1): Pt will thread LB clothing with mod A  Skilled Therapeutic Interventions/Progress Updates:   Session 1:   Session 2:   Therapy Documentation Precautions:  Precautions Precautions: Fall Recall of Precautions/Restrictions: Intact Precaution/Restrictions Comments: 6 falls recently Restrictions Weight Bearing Restrictions Per Provider Order: No   Therapy/Group: Individual Therapy  Artemus Biles, OTR/L, MSOT  07/24/2023, 9:32 PM

## 2023-07-24 NOTE — Progress Notes (Signed)
 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 She has never been exposed to tobacco smoke. She has never used smokeless tobacco. She reports current alcohol use. She reports that she does not use drugs.  Family / Support Systems Marital Status: Single Spouse/Significant Other: N/A Children: no children Other Supports: various family members Anticipated Caregiver: mother Ability/Limitations of Caregiver: Pt will have support from her mother works from home and she will be primary caregiver. PRn support from other family members. Caregiver Availability: 24/7 Family Dynamics: Pt lives with her parents, and uncle.  Social History Preferred language: English Religion: [employer] Day [employer]ist Cultural Background: Pt completed college Education: college Health Literacy - How often do you need to have someone help you when you read instructions, pamphlets, or other written material from  your doctor or pharmacy?: Never Writes: Yes Employment Status: Employed Name of Employer: Nurse, learning disability- at several different schools since 2019; [employer] Return to Work Plans: TBD. Has completed Legal History/Current Legal Issues: Denies Guardian/Conservator: Denies   Abuse/Neglect Abuse/Neglect Assessment Can Be Completed: Yes Physical Abuse: Denies Verbal Abuse: Denies Sexual Abuse: Denies Exploitation of patient/patient's resources: Denies Self-Neglect: Denies  Patient response to: Social Isolation - How often do you feel lonely or isolated from those around you?: Never  Emotional Status Pt's affect, behavior and adjustment status: Pt tired at time of visit. Able to complete Recent Psychosocial Issues: Denies Psychiatric History: Denies Substance Abuse History: Denies  Patient / Family Perceptions, Expectations & Goals Pt/Family understanding of illness & functional limitations: Pt and family have ageneral understanding of pt care needs Premorbid pt/family roles/activities: Independent Anticipated changes in roles/activities/participation: Assistance with ADLs/IADLs Pt/family expectations/goals: Pt would like to wokr on standing, getting up, and mobility  [employer]: None Premorbid Home Care/DME Agencies: None Transportation available at discharge: TBD Is the patient able to respond to transportation needs?: Yes In the past 12 months, has lack of transportation kept you from medical appointments or from getting medications?: No In the past 12 months, has lack of transportation kept you from meetings, work, or from getting things needed for daily living?: No Resource referrals recommended: Neuropsychology  Discharge Planning Living Arrangements: Other relatives, Parent Support Systems: Parent, Other relatives Type of Residence: Private residence Insurance Resources: Media planner (specify) ([employer] Medicare and  Medicaid) Financial Resources: Employment Financial Screen Referred: No Living Expenses: Lives with family Money Management: Patient Does the patient have any problems obtaining your medications?: No Home Management: Pt reports everyone  maintains their own areas; mother prepares all meals Patient/Family Preliminary Plans: TBD Care Coordinator Barriers to Discharge: Decreased caregiver support, Lack of/limited family support, Insurance for SNF coverage Care Coordinator Anticipated Follow Up Needs: HH/OP Expected length of stay: 4 weeks  Clinical Impression SW met with pt to introduce self, explain role, discuss discharge process, and inform on ELOS. No HCPOA. No DME.   Rennis Case 07/24/2023, 12:27 PM

## 2023-07-24 NOTE — Consult Note (Signed)
 Neuropsychological Consultation [facility]   Patient:   Tiffany Velasquez   DOB:   Sep 22, 1995  MR Number:  [id number]  Location:  [facility] [address] CENTER A [address] [place] [address] Dept: [phone] Loc: [phone]           Date of Service:   07/24/2023  Start Time:   8 AM End Time:   9 AM  Provider/Observer:  Chapman Commodore, Psy.D.       Clinical Neuropsychologist       Billing Code/Service: [phone]  Reason for Service:    Tiffany Velasquez is a 28 year old female referred for neuropsych consultation due to ongoing adjustment issues with depression and anxiety due to complex medical issues.  Patient has past medical history of HTN, chorioretinal inflammation (IVIG), borderline glaucoma and retinal edema, multiple vitamin deficiencies, chronic foot pain, abnormal immunoglobins, obesity and has been worked up in preparation for bariatric surgery.  Patient lost 150 lbs in past year with gastritis and NV, neuropathy and progressive weakness with sensory loss.  Low folate, vit D and vit A noted.  Started on Vitamin replacement and medical workup stated.  Patient worked up and now admitted to CIR due to functional deficits, pain and ongoing medical care.  During clinical visit today, patient was distressed and upset it has taken so long to figure out what was going wrong.  Patient very concerned about long-term functional loss.  Patient reports that she is improving but with continued pain in feet, weakness, neuropathy etc.  Worked on coping and adjustment issues.  Rapport was easy to establish and worked on coping and adjustment issues and patient understanding her medical status as patient with limits to medical understanding.  Will follow-up with patient.    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 Per reports falls X 6 in 2 weeks  with lack of feeling in legs and occasional dizziness when standing. She denied  She was sent to ED for evaluation. She was found to have low folate, low vitamin D  and Vitamin A , elevated CRP, low BI <6. She was started on IV thiamine  qid,Vitamin A  IM, IV  folate, B12 injections and Vitamin D  supplement.MRI brain and spine without acute abnormality.  CT chest done due to intermittent tachycardia and she was also found to have multifocal PNA treated with 5 day course of Augmentin . 2 D echo showed EF 55% with moderate to severe TVR and G1DD.     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 Discontinuation of duloxetine  (as can contribute to rhabdo)and muscle biopsy recommended initially by Dr.Stack but patient has had improvement with aggressive vitamin supplementation symptoms felt to be due to nutritional etiology though infectious or inflammatory etiology questioned. Lidocaine  gel added for chronic foot pain and colchicine  d/c.    Pt reports severe painful peripheral neuropathy in her feet and generalized LE>UE weakness.  LBM yesterday Been using Purewick  due to inability/difficulty getting OOB.  Hurting in back and legs- aching and meds are helpful.   Medical History:   Past Medical History:  Diagnosis Date   Asthma    Eczema    Glaucoma    Hypertension    Morbid obesity (HCC)    Transaminitis 07/15/2023   Uveitic glaucoma of both eyes, 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         Patient Active Problem List    Diagnosis Date Noted   Adjustment disorder with mixed anxiety and depressed mood 07/24/2023   Myositis 07/21/2023   Myositis associated antibody positive 07/20/2023   Vitamin A  deficiency 07/19/2023   Pneumonia of both lungs due to infectious organism 07/19/2023   Polyneuropathy associated with underlying disease (HCC) 07/19/2023   Myositis of left lower extremity 07/18/2023   Vitamin D  deficiency 07/18/2023   Unable to stand up 07/17/2023   Anemia 07/16/2023   Urinary retention 07/16/2023   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) 07/16/2023   Unintentional weight loss of 10% body weight within 6 months 07/16/2023   Vitamin B12 deficiency 07/16/2023   Peanut  allergy  07/15/2023   Port-A-Cath in place 07/15/2023   Immunosuppression due to drug therapy (HCC) 07/15/2023   Acetazolamide  Therapy 07/15/2023   Highly Elevated C-reactive protein (CRP) 07/15/2023   Folate deficiency 07/15/2023   Thiamine  deficiency, possible 07/15/2023   Elevated sedimentation rate measurement 07/15/2023   Transaminitis 07/15/2023   Elevated CK 07/15/2023   Weight loss, 150 lbs in 1 year 07/15/2023   Persistent nausea & vomiting 07/15/2023   Opioid Therapy 07/15/2023   Chorioretinal inflammation of both eyes 07/15/2023   mild Glaucoma, steroid induced 07/15/2023   Nausea & vomiting 07/14/2023   Chronic health problem 07/14/2023   Lower extremity weakness, bilateral 07/14/2023   Tachycardia  Multifocal PNA 07/14/2023   Hypokalemia 07/09/2023   Elevated liver enzymes 07/09/2023   AKI (acute kidney injury) (HCC) 07/08/2023   Generalized weakness  Myositis 07/08/2023   Hypotension 07/08/2023   Dehydration 07/08/2023   Paresthesia 07/08/2023   Neuropathy 06/05/2023   Gout 05/29/2023   Epigastric abdominal tenderness without rebound tenderness 05/10/2023   Fatigue 05/10/2023   Gastroesophageal reflux disease 11/08/2022   Pain in both feet  07/16/2021   Iritis 07/26/2018   Seasonal and perennial allergic rhinitis 07/26/2018   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 her dress was Appropriate and she was Well Groomed and her manners were Appropriate to the situation.  her participation was indicative of Appropriate and Redirectable behaviors.  There were physical disabilities noted.  she displayed an appropriate level of cooperation and motivation.    Interactions:    Active Appropriate  Attention:   within normal limits and attention span and concentration were age appropriate  Memory:   within normal limits; recent and remote memory intact  Visuo-spatial:   not examined  Speech (Volume):  normal  Speech:   normal; normal  Thought Process:  Coherent  Coherent and Focused  Though Content:  Rumination; not suicidal and not homicidal  Orientation:   person, place, time/date, and situation  Judgment:   Fair  Planning:   Poor  Affect:    Anxious, Depressed, and Tearful  Mood:    Dysphoric  Insight:   Fair  Intelligence:   normal  Psychiatric History:  No prior psychiatric history  History of Substance Use or Abuse:  No concerns of substance abuse are reported.    Family Med/Psych History:  Family History  Problem Relation Age of Onset   Asthma Mother    Allergic rhinitis Father    Sudden death Cousin    Eczema Neg Hx    Urticaria Neg Hx     Impression/DX:   Tiffany Velasquez is a 28 year old female referred for  neuropsych consultation due to ongoing adjustment issues with depression and anxiety due to complex medical issues.  Patient has past medical history of HTN, chorioretinal inflammation (IVIG), borderline glaucoma and retinal edema, multiple vitamin deficiencies, chronic foot pain, abnormal immunoglobins, obesity and has been worked up in preparation for bariatric surgery.  Patient lost 150 lbs in past year with gastritis and NV, neuropathy and progressive weakness with sensory loss.  Low folate, vit D and vit A noted.  Started on Vitamin replacement and medical workup stated.  Patient worked up and now admitted to CIR due to functional deficits, pain and ongoing medical care.  During clinical visit today, patient was distressed and upset it has taken so long to figure out what was going wrong.  Patient very concerned about long-term functional loss.  Patient reports that she is improving but with continued pain in feet, weakness, neuropathy etc.  Worked on coping and adjustment issues.  Rapport was easy to establish and worked on coping and adjustment issues and patient understanding her medical status as patient with limits to medical understanding.  Will follow-up with patient.    Diagnosis:    Adjustment disorder with anxiety and depression         Electronically Signed   _______________________ Chapman Commodore, Psy.D. Clinical Neuropsychologist

## 2023-07-24 NOTE — Plan of Care (Signed)
  Problem: RH BLADDER ELIMINATION Goal: RH STG MANAGE BLADDER WITH ASSISTANCE Description: STG Manage Bladder With supervision Assistance Outcome: Progressing   Problem: RH SKIN INTEGRITY Goal: RH STG SKIN FREE OF INFECTION/BREAKDOWN Description: Manage skin free of infection/breakdown with supervision Outcome: Progressing   Problem: RH SAFETY Goal: RH STG ADHERE TO SAFETY PRECAUTIONS W/ASSISTANCE/DEVICE Description: STG Adhere to Safety Precautions With supervision  Assistance/Device. Outcome: Progressing   Problem: RH PAIN MANAGEMENT Goal: RH STG PAIN MANAGED AT OR BELOW PT'S PAIN GOAL Description: <4 w/ prns Outcome: Progressing

## 2023-07-24 NOTE — Progress Notes (Signed)
 Occupational Therapy Session Note  Patient Details  Name: Tiffany Velasquez MRN: [id number] Date of Birth: 1996/02/13  Today's Date: 07/24/2023 OT Individual Time: 4782-9562 OT Individual Time Calculation (min): 67 min    Short Term Goals: Week 1:  OT Short Term Goal 1 (Week 1): Pt will transfer with LRAD with mod +2 to [facility] OT Short Term Goal 2 (Week 1): Pt will consistenly get to EOB with bed rail and features wtih min guard in prep for ADL OT Short Term Goal 3 (Week 1): Pt will don shirt with setup OT Short Term Goal 4 (Week 1): Pt will thread LB clothing with mod A  Skilled Therapeutic Interventions/Progress Updates:    Pt received supine with no c/o pain, agreeable to OT session. She donned pants using modified bridge with mod A. She completed bed mobility to EOB with very fast movements but min A overall. She completed sit > stand from EOB with mod +2 assist for safety. Using the eva walker she completed 10 ft of functional mobility with very close guarding and w/c follow, +2 but only min +A for balance support. Pt still presenting with truncal ataxia. She required quick sit to TIS w/c. Max A +2 to scoot back in w/c safely. Pt was taken via w/c to the therapy gym for time management. She then stood to attempt another walk, x3 attempts with min +2 assist. She had incontinent BM on herself/floor unfortunately. She was very embarrassed and quickly assisted back to standing. She completed a stand pivot transfer with the RW back to bed with mod A, fast and uncontrolled descent to the bed. She was cleaned up, rolling R and L several times with close (S), total A for hygiene. Pt left supine with all needs met.   Therapy Documentation Precautions:  Precautions Precautions: Fall Recall of Precautions/Restrictions: Intact Precaution/Restrictions Comments: 6 falls recently Restrictions Weight Bearing Restrictions Per Provider Order: No  Therapy/Group: Individual Therapy  Una Ganser 07/24/2023, 6:22 AM

## 2023-07-24 NOTE — Progress Notes (Signed)
 [facility] consult received to obtain lab work from implanted port. Upon arrival at patient's bedside, attempted to withdraw blood unsuccessfully; numerous NS flushes and patient positions were attempted. Pt refused to have port re-accessed. Unit RN notified to contact phlebotomy to come collect lab work via peripheral stick. Alteplase  ordered to be instilled in port by [facility]. Unit RN notified to contact [facility] once alteplase  arrives on unit.

## 2023-07-24 NOTE — Progress Notes (Signed)
 Physical Therapy Session Note  Patient Details  Name: Tiffany Velasquez MRN: [id number] Date of Birth: 03-May-1995  Today's Date: 07/24/2023 PT Individual Time: 1430-1520 PT Individual Time Calculation (min): 50 min  and Today's Date: 07/24/2023 PT Missed Time: 10 Minutes Missed Time Reason: Patient fatigue  Short Term Goals: Week 1:  PT Short Term Goal 1 (Week 1): Pt will roll side to side w/ supervision. PT Short Term Goal 2 (Week 1): Pt will transfer sup to sit w/ min A consistently and safely. PT Short Term Goal 3 (Week 1): Pt will transfer sit to stand from elevated surface w/ mod A +1 consistently. PT Short Term Goal 4 (Week 1): PT to assess gait.  Skilled Therapeutic Interventions/Progress Updates:     Pt supine in bed upon arrival. Pt agreeable to therapy. Pt denies any pain.   IV team present to disconnect IV. Pt donned pants while supine in bed with min A to feed through B LE, pt able to utilize bridging technique to donn pants with supervision and increased time.   Pt performed supine to sit with CGA from flat bed with use of bed rail and increased time, verbal cues provided for technique with emphasis on UE positioning.   Pt performed sit to stand with +2 A from elevated bed with eva walked and ambulated 2x~15 feet with eva walker and +2 A, with seated rest break between. Pt required tilt function for seated rest break as pt sliding forward out of chair requiring max A +2 for correction 2/2 fatigue.   Pt performed the following exercises while supine for B LE/core strengthening, independence with mobility and locomotion:   1x5 glute bridges, progressed to 1x3 holding for 3", and 1x5 holding for 5" within pt available range, with therapist providing encouragement for increased clearance and sustaining hold.    1x5 SLR B active assisted L LE>R LE  1x10 supine clamshells, verabl cues provided for slow and controlled  Pt missed 10 min 2/2 fatigue.   Pt supine in bed with all  needs within reach and bed alarm on.     Therapy Documentation Precautions:  Precautions Precautions: Fall Recall of Precautions/Restrictions: Intact Precaution/Restrictions Comments: 6 falls recently Restrictions Weight Bearing Restrictions Per Provider Order: No  Therapy/Group: Individual Therapy  [facility] Jenney Modest, [place], DPT  07/24/2023, 12:48 PM

## 2023-07-24 NOTE — Progress Notes (Signed)
 Physical Therapy Session Note  Patient Details  Name: Tiffany Velasquez MRN: [id number] Date of Birth: [birth year]  Today's Date: 07/24/2023 PT Individual Time: 0910-0958 PT Individual Time Calculation (min): 48 min  And  Today's Date: 07/24/2023 PT Missed Time: 12 Minutes Missed Time Reason: Unavailable (Pt with Neuropsychologist)  Short Term Goals: Week 1:  PT Short Term Goal 1 (Week 1): Pt will roll side to side w/ supervision. PT Short Term Goal 2 (Week 1): Pt will transfer sup to sit w/ min A consistently and safely. PT Short Term Goal 3 (Week 1): Pt will transfer sit to stand from elevated surface w/ mod A +1 consistently. PT Short Term Goal 4 (Week 1): PT to assess gait.  Skilled Therapeutic Interventions/Progress Updates:  Patient supine in bed on entrance to room. Patient alert and agreeable to PT session. +2 available for physical assist throughout session.   Patient with no pain complaint at start of session. But does relate that she usually has very severe neuropathic pain in Bil feet and ankles. Requests care with movement.   Explained to pt thought to attempt to stand but to test out feeling in feet with progressive weight bearing while seated EOB and raising height of bed. Pt receptive to attempt. Does relate that she has not stood up since arriving at hospital.   Therapeutic Activity: Bed Mobility: While supine, bari socks donned with extreme care and pt relates no increase in pain. Also has been premedicated. Pt is able to roll to R side with supervision/ Mod I. Moves BLE independently into sidelying position and then off of EOB. Pt does relate increased pain briefly with LLE pressure into lowered siderail of OOK bed. Unable to remove rail for improved comfort. Requires rest prior to pushing UB up from elevated HOB. Then able to push with BUE and requires Min/ ModA to reach upright seated position. VC provided for increased effort. Then is able to maintain seated position for  prior to lying back to R side. But then is able to return to upright seated position with CGA.   Height of bed elevated in increments x3 and instructed pt to scoot forward to place Bil feet on floor, each time with supervision.   Transfers/ NMR: Bari RW placed in front of pt and prepared for sequence required to perform rise to stand and reminded her of strength available in BLE. If pt feels pain in feet, to push through and not stop with effort. Counted down for pt and she is able to rise to stand from elevated height with MinA from PT and CGA from +2 with guard to Bil knees. Forward lean in balance on rising to stand requiring MinA to attain balance in stance. Noted that pt locks knees upon rise to stand. Pt stands for >1min holding balance with no physical assist but guarded at knees and hips. Pt able to follow instructions to take hands from RW and stands with no UE support. Continued CGA. Is able to cross arms over chest, then touch back to RW x4. Starts to demo fatigue. Guided in return to sit with vc of controlling knee and hip flexion. Pt is able to control descent to sit requiring MinA. Provided with seated rest and pt leans UB to side of elevated HOB. Returns to upright seated position but unsure of strength and requesting to remain in bed to save energy for additional OT and PT sessions this day.  NMR performed for improvements in motor control and coordination,  balance, sequencing, judgement, and self confidence/ efficacy in performing all aspects of mobility at highest level of independence.   Educated pt in conscious practice for pain mgmt in Bil feet with mental focus on reminder that no fire is close to feet with burning sensation, and no item causing sharp, shooting pain.  Reminded to move BLE into ankle mobility and then attempt heel slides if pain is managed. Also attempt SL abd/ add.   Patient supine in bed at end of session with brakes locked, bed alarm set, and all needs within  reach.   Therapy Documentation Precautions:  Precautions Precautions: Fall Recall of Precautions/Restrictions: Intact Precaution/Restrictions Comments: 6 falls recently Restrictions Weight Bearing Restrictions Per Provider Order: No General: PT Amount of Missed Time (min): 12 Minutes PT Missed Treatment Reason: Unavailable (Comment) (Pt with Neuropsychologist) Vital Signs: Therapy Vitals Temp: 97.7 F (36.5 C) Temp Source: Oral Pulse Rate: 96 Resp: 19 BP: 94/68 Patient Position (if appropriate): Lying Oxygen Therapy SpO2: 100 % O2 Device: Room Air Pain:  No complaint of neuropathic pain this session.   Therapy/Group: Individual Therapy  Donne Gage PT, DPT, CSRS 07/24/2023, 6:03 PM

## 2023-07-24 NOTE — Progress Notes (Addendum)
 Patient ID: [name], female   DOB: 05/16/1995, 28 y.o.   MRN: [id number]  1124- SW spoke with her mother to introduce self, explain role, discuss discharge process, and inform on ELOS. She confirms that she works from home,and patient will have support form various family members once she gets home. She is aware SW will follow-up once there are more updates.   1219- SW second attempt to meet with pt and pt sleeping and unable to engage as she did appear to want to get up.  *pt woke up prior to SW leaving room and assessment completed.   Norval Been, MSW, LCSW Office: [phone] Cell: [phone] Fax: [phone]

## 2023-07-25 ENCOUNTER — Encounter: Payer: Self-pay | Admitting: Student

## 2023-07-25 LAB — BASIC METABOLIC PANEL WITH GFR
Anion gap: 7 (ref 5–15)
BUN: 21 mg/dL — ABNORMAL HIGH (ref 6–20)
CO2: 18 mmol/L — ABNORMAL LOW (ref 22–32)
Calcium: 9.4 mg/dL (ref 8.9–10.3)
Chloride: 109 mmol/L (ref 98–111)
Creatinine, Ser: 0.47 mg/dL (ref 0.44–1.00)
GFR, Estimated: >60 mL/min (ref >60)
Glucose, Bld: 105 mg/dL — ABNORMAL HIGH (ref 70–99)
Potassium: 3.5 mmol/L (ref 3.5–5.1)
Sodium: 134 mmol/L — ABNORMAL LOW (ref 135–145)

## 2023-07-25 MED ORDER — GABAPENTIN 400 MG PO CAPS
1000.0000 mg | ORAL_CAPSULE | Freq: Every day | ORAL | Status: DC
  Administered 2023-07-25 – 2023-08-13 (×20): 1000 mg via ORAL
  Filled 2023-07-25 (×20): qty 2

## 2023-07-25 MED ORDER — DULOXETINE HCL 30 MG PO CPEP
30.0000 mg | ORAL_CAPSULE | Freq: Two times a day (BID) | ORAL | Status: DC
  Administered 2023-07-25 – 2023-08-16 (×44): 30 mg via ORAL
  Filled 2023-07-25 (×44): qty 1

## 2023-07-25 MED ORDER — GABAPENTIN 300 MG PO CAPS
600.0000 mg | ORAL_CAPSULE | Freq: Two times a day (BID) | ORAL | Status: DC
  Administered 2023-07-26 – 2023-08-14 (×40): 600 mg via ORAL
  Filled 2023-07-25 (×43): qty 2

## 2023-07-25 NOTE — Progress Notes (Signed)
 Patient ID: Tiffany Velasquez, female   DOB: Aug 14, 1995, 28 y.o.   MRN: [id number]  SW made efforts to meet with pt and provide updates but pt was sleeping. SW will make efforts to follow-up.   1340- SW spoke with pt mother ot provide updates from team conference, and d/c date 5/16. SW will provide updates as available.   Norval Been, MSW, LCSW Office: [phone] Cell: [phone] Fax: [phone]

## 2023-07-25 NOTE — Progress Notes (Signed)
 Physical Therapy Session Note  Patient Details  Name: Tiffany Velasquez MRN: [id number] Date of Birth: [birth year]  Today's Date: 07/25/2023 PT Individual Time: 1115-1200 and 1300-1338 PT Individual Time Calculation (min): 45 min and 38 min.  Short Term Goals: Week 1:  PT Short Term Goal 1 (Week 1): Pt will roll side to side w/ supervision. PT Short Term Goal 2 (Week 1): Pt will transfer sup to sit w/ min A consistently and safely. PT Short Term Goal 3 (Week 1): Pt will transfer sit to stand from elevated surface w/ mod A +1 consistently. PT Short Term Goal 4 (Week 1): PT to assess gait.  Skilled Therapeutic Interventions/Progress Updates:   First session:  Pt presents supine in bed after nursing care given , missed time of 15', but now agreeable to therapy.  Pt slow to respond to questions, directions but pleasant.  Pt required min A for sidelying to sitting EOB after 2-3 attempts to perform independently from flat bed.  Pt encouraged to habituate feet to floor and WB'ing before standing.  Pt transferred sit to stand from elevated bed  and +2 to RW.  Pt demo's ataxic trunk movements initially.  Pt amb to TIS w/ A + 2 and RW but tends to sit w/o use of UES for safety.  Pt required seated rest breaks.  Pt amb bed<> TIS x 2 trials, approx. 5-8'.  Head rest removed from w/c for comfort.  Pt performed seated LAQ 2 x 8 and then supine HS 2 x 10.  Pt often closing eyes but states "this is how I deal w/ my pain."  Pt remained supine in bed w/ all needs in reach.  Second session:  Pt presents supine in bed asleep.  Pt fell asleep and did not eat lunch.  Pt agreeable to therapy and transfers sidelying to sit w/ CGA although pt initiates and then stops, appearing to fall asleep, but then resumes.  Pt w/ slow responses to questions and directions.  Pt transfers sit to stand w/ mod A + 2 from elevated bed pushing up from RW, even w/ cueing.  Pt amb x 4' to TIS, min/mod A of 1, sitting before reaching back to  control descent.  Pt w/ head lolling back, but states not falling asleep.  Pt wheeled to main gym for energy conservation.  Pt performed sit to stand w/ mod A and stood x 1', but c/o increased pain to B feet.  Pt performed seated hooking horseshoes over BBALL hoop w/ alternating UES, but c/o weakness/pain UES w/ difficulty w/ perception to place over hoop.  Pt returned to room and remained sitting in TIS w/ chair alarm on and all needs in reach, to eat lunch.  Spoke w/ nursing to discuss observations of lethargy?/falling asleep? during session.     Therapy Documentation Precautions:  Precautions Precautions: Fall Recall of Precautions/Restrictions: Intact Precaution/Restrictions Comments: 6 falls recently Restrictions Weight Bearing Restrictions Per Provider Order: No General: PT Amount of Missed Time (min): 15 Minutes PT Missed Treatment Reason: Nursing care Vital Signs:   Pain:4/10 B feet, 5/10 LLE and feet. Pain Assessment Pain Scale: 0-10 Pain Score: 5  Pain Type: Neuropathic pain Pain Intervention(s): Medication (See eMAR)   Therapy/Group: Individual Therapy  Odessia Benedict 07/25/2023, 12:21 PM

## 2023-07-25 NOTE — Discharge Instructions (Addendum)
 Inpatient Rehab Discharge Instructions  Tiffany Velasquez Discharge date and time:  08/16/23  Activities/Precautions/ Functional Status: Activity: no lifting, driving, or strenuous exercise  till cleared by MD Diet: regular diet Wound Care: none needed   Functional status:  ___ No restrictions     ___ Walk up steps independently ___ 24/7 supervision/assistance   ___ Walk up steps with assistance _X__ Intermittent supervision/assistance  ___ Bathe/dress independently _X__ Walk with walker    ___ Bathe/dress with assistance ___ Walk Independently    ___ Shower independently ___ Walk with assistance    _X__ Shower with assistance _X__ No alcohol     ___ Return to work/school ________   Special Instructions:   COMMUNITY REFERRALS UPON DISCHARGE:    Outpatient: PT              Agency: [facility]    Phone: [phone]             Appointment Date/Time:*Please expect follow-up within 2-3 business days to schedule your appointment. If you have not received follow-up, be sure to contact the site directly.*   Medical Equipment/Items Ordered:bariatric rollator, bariatric drop arm bedside commode-received 07/09/23 and tub transfer bench. Dispute of BSC sent to [employer]; they are following up on delivery of BSC to the home.                                                 Agency/Supplier: [employer] [facility] [phone]    My questions have been answered and I understand these instructions. I will adhere to these goals and the provided educational materials after my discharge from the hospital.  Patient/Caregiver Signature _______________________________ Date __________  Clinician Signature _______________________________________ Date __________  Please bring this form and your medication list with you to all your follow-up doctor's appointments.

## 2023-07-25 NOTE — Patient Care Conference (Signed)
 Inpatient RehabilitationTeam Conference and Plan of Care Update Date: 07/25/2023   Time: 1050 am    Patient Name: Tiffany Velasquez      Medical Record Number: [id number]  Date of Birth: 1995-08-02 Sex: Female         Room/Bed: 4W14C/4W14C-01 Payor Info: Payor: [employer] MEDICARE / Plan: [employer] MEDICARE HMO / Product Type: *No Product type* /    Admit Date/Time:  07/20/2023 10:06 PM  Primary Diagnosis:  Myositis associated antibody positive  Hospital Problems: Principal Problem:   Myositis associated antibody positive Active Problems:   Myositis   Adjustment disorder with mixed anxiety and depressed mood    Expected Discharge Date: Expected Discharge Date: 08/11/23  Team Members Present: Physician leading conference: Dr. Cherri Corns Social Worker Present: Norval Been, LCSWA Nurse Present: Jerene Monks, RN PT Present: Theodis Fiscal, PT OT Present: Artemus Biles, OT SLP Present: Other (comment) Armin Landing Arenz,SLP) PPS Coordinator present : Jestine Moron, SLP     Current Status/Progress Goal Weekly Team Focus  Bowel/Bladder   Continent B/B with some incontinence episode per shift report.  LBM 4/28   Will remain continent with B/B   Assist with toilet needs qshift/prn    Swallow/Nutrition/ Hydration               ADL's   Mod-Max A overall. Barriers: Generalized weakness, decreased activity tolerance, potential self-limiting behaviors/learned helpness.   Min A   Functional transfers, standing tolerance/balance, general conditioning, activity tolerance    Mobility   CGA bed mobility, modA +2 gait x15' with EVA walker   CGA  transfers, ambulation, endurance, balance    Communication                Safety/Cognition/ Behavioral Observations               Pain   Verbalizes burning sensation to BLE. Scheduled/prn meds to manage pain   Will be free from pain   Assess pain qshift/prn    Skin   Skin is intact   Will miantain skin intergrity with no  breakdown  Assess skin qshift/prn and assist with turning/repositioning to prevent breakdown      Discharge Planning:  Pt will d/c to home with her parents. Support from various family members. SW will confirm there are no barriers to discharge.    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 Patient has pain,tachycardia, high blood pressure: medication adjusted by MD. Patient limited by generalized weakness, some self limiting behaviors and decreased activity intolerance.   Patient on target to meet rehab goals: yes, Patient requires mod- max assist with ADLs  with bed level lower body care.Patient able tto STS with regular walker  Patient able to walked 15' with EVA walkert + 2 assist. Overall goals at discharge was set for Min A-CGA at discharge.   *See Care Plan and progress notes for long and short-term goals.   Revisions to Treatment Plan:  EVA walker Neuropsych consult . Teaching Needs: Safety, medications, toileting, transfers, dietary modifications,etc.   Current Barriers to Discharge: Decreased caregiver support, Weight, and Behavior  Possible Resolutions to Barriers: Family Education     Medical Summary Current Status: Medically complicated by uncontrolled pain, hypokalemia, tachycardia, diastolic heart failure, bowel and bladder incontinence, diarrhea, and obesity  Barriers to Discharge: Cardiac Complications;Behavior/Mood;Medical stability;Incontinence;Morbid Obesity;Uncontrolled Pain   Possible Resolutions to [employer]: Titrate pain medications to minimal tolerable dosing, treat electrolyte abnormalities,  monitor vitals and encourage p.o. intakes, timed toileting and dietary adjustments for diarrhea   Continued Need for Acute Rehabilitation Level of Care: The patient requires daily medical management by a physician with specialized training in physical medicine and  rehabilitation for the following reasons: Direction of a multidisciplinary physical rehabilitation program to maximize functional independence : Yes Medical management of patient stability for increased activity during participation in an intensive rehabilitation regime.: Yes Analysis of laboratory values and/or radiology reports with any subsequent need for medication adjustment and/or medical intervention. : Yes   I attest that I was present, lead the team conference, and concur with the assessment and plan of the team.   Jerene Monks 07/25/2023, 1050 am

## 2023-07-25 NOTE — Plan of Care (Signed)
  Problem: RH BOWEL ELIMINATION Goal: RH STG MANAGE BOWEL WITH ASSISTANCE Description: STG Manage Bowel with supervision Assistance. Outcome: Progressing   Problem: RH BLADDER ELIMINATION Goal: RH STG MANAGE BLADDER WITH ASSISTANCE Description: STG Manage Bladder With supervision Assistance Outcome: Progressing   Problem: RH SKIN INTEGRITY Goal: RH STG SKIN FREE OF INFECTION/BREAKDOWN Description: Manage skin free of infection/breakdown with supervision Outcome: Progressing   Problem: RH SAFETY Goal: RH STG ADHERE TO SAFETY PRECAUTIONS W/ASSISTANCE/DEVICE Description: STG Adhere to Safety Precautions With supervision  Assistance/Device. Outcome: Progressing   Problem: RH PAIN MANAGEMENT Goal: RH STG PAIN MANAGED AT OR BELOW PT'S PAIN GOAL Description: <4 w/ prns Outcome: Progressing

## 2023-07-25 NOTE — Progress Notes (Signed)
 PROGRESS NOTE   Subjective/Complaints:  Continues with mild tachycardia, otherwise stable, blood pressure less soft today.  No complaints of pain overnight per record, however patient endorses very poor sleep and excruciating pain only at nighttime.  She states during the day, her foot pain is not bothersome and now, she is standing and taking steps!  A.m. labs significant for resolved hypokalemia, stable NA 134, stable BUNs/creatinine.  Continues with mostly liquid stools, intermittent incontinence, last 4-28 afternoon.  Discussed increasing duloxetine ; patient states that she was taken off of it once due to some lab work concerns, but she is unsure why.  Is agreeable to increasing to twice daily for mood and pain control.  ROS: as per HPI. Denies CP, SOB, abd pain, N/V/D/C, or any other complaints at this time.   + burning pain both feet Insomnia   Objective:   No results found. Recent Labs    07/24/23 0220  WBC 5.9  HGB 9.5*  HCT 30.6*  PLT 308   Recent Labs    07/24/23 1830 07/25/23 0345  NA 133* 134*  K 3.4* 3.5  CL 108 109  CO2 16* 18*  GLUCOSE 101* 105*  BUN 22* 21*  CREATININE 0.56 0.47  CALCIUM 9.3 9.4    Intake/Output Summary (Last 24 hours) at 07/25/2023 0936 Last data filed at 07/24/2023 1308 Gross per 24 hour  Intake 240 ml  Output --  Net 240 ml        Physical Exam: Vital Signs Blood pressure 125/62, pulse (!) 104, temperature 98.6 F (37 C), resp. rate 18, height 5\' 9"  (1.753 m), weight (!) 149 kg, SpO2 100%.    General: NAD, laying in bed.  Obese. HEENT: Head is normocephalic, atraumatic, MMM Neck: Supple without JVD or lymphadenopathy Heart: Regular rate and rhythm, no m/r/g appreciated Chest: CTA bilaterally without wheezes, rales, or rhonchi; no distress.   Abdomen: Soft, non-tender, non-distended, bowel sounds positive. Extremities: No clubbing, cyanosis, Tr b/l edema.   Psych: Pt's affect is flat. Pt is cooperative. Some manipulating behaviors (ex. If asked about fatigue, immediately pretends to fall asleep.) Skin: Clean and intact without signs of breakdown over exposed surfaces.  Right chest port C-D-I Neuro:  Awake, alert, oriented x 4.  No apparent cognitive deficits. Good insight into current situation. Strength antigravity 4- out of 5 bilateral upper extremities, 3-/5 bilateral hip flexors, 4 out of 5 distal lower extremities. Hypersensitivity to light touch in bilateral feet/ankles.  Otherwise, sensation intact No apparent tone, no abnormal movements or tremors   Assessment/Plan: 1. Functional deficits which require 3+ hours per day of interdisciplinary therapy in a comprehensive inpatient rehab setting. Physiatrist is providing close team supervision and 24 hour management of active medical problems listed below. Physiatrist and rehab team continue to assess barriers to discharge/monitor patient progress toward functional and medical goals  Care Tool:  Bathing    Body parts bathed by patient: Right arm, Left arm, Chest, Abdomen, Face   Body parts bathed by helper: Front perineal area, Buttocks, Right upper leg, Left upper leg, Right lower leg, Left lower leg     Bathing assist Assist Level: Maximal Assistance - Patient 24 - 49%  Upper Body Dressing/Undressing Upper body dressing   What is the patient wearing?: Hospital gown only    Upper body assist Assist Level: Total Assistance - Patient < 25%    Lower Body Dressing/Undressing Lower body dressing    Lower body dressing activity did not occur: Environmental limitations (no clothes available)       Lower body assist       Toileting Toileting    Toileting assist Assist for toileting: Dependent - Patient 0%     Transfers Chair/bed transfer  Transfers assist     Chair/bed transfer assist level: Dependent - mechanical lift     Locomotion Ambulation   Ambulation  assist   Ambulation activity did not occur: Safety/medical concerns          Walk 10 feet activity   Assist  Walk 10 feet activity did not occur: Safety/medical concerns        Walk 50 feet activity   Assist Walk 50 feet with 2 turns activity did not occur: Safety/medical concerns         Walk 150 feet activity   Assist Walk 150 feet activity did not occur: Safety/medical concerns         Walk 10 feet on uneven surface  activity   Assist Walk 10 feet on uneven surfaces activity did not occur: Safety/medical concerns         Wheelchair     Assist Is the patient using a wheelchair?: No             Wheelchair 50 feet with 2 turns activity    Assist            Wheelchair 150 feet activity     Assist          Blood pressure 125/62, pulse (!) 104, temperature 98.6 F (37 C), resp. rate 18, height 5\' 9"  (1.753 m), weight (!) 149 kg, SpO2 100%.  Medical Problem List and Plan: 1. 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 If unaccessed?             -ELOS/Goals: 2-4 weeks -  min A hopefully - 08/11/23             -Continue CIR   - 4/29: Having help from mom/dad at home in discharge. Mod-Max A ADLs with bed-level LB. Biggest barriers are weakness and some self-limiting behaviors. Walked 15 feet with EVA walker. Regular walker STS today.   2.  Antithrombotics: -DVT/anticoagulation:  Pharmaceutical: Lovenox  70mg  daily             -antiplatelet therapy: N/A 3. Pain Management: Has been using oxycodone  prn past 3 days? For foot, leg and back pain- also has Gabapentin  800mg  TID (increased 4/25) - duloxetine  stopped initially due to myositis- 30 mg daily resumed 4/23 -07/23/23 feet very sensitive, but cymbalta  and gabapentin   recently adjusted, lidocaine  cream not enough so asked that pharmacy send 15g tubes per treatment, to adequately cover the plantar surfaces of her feet to see if we can get relief.  4-28: Remains very sensitive, does feel some improvement 4-29: Patient with some daytime lethargy, pain primarily at night.  Reduce daytime gabapentin  to 600 mg every morning/600 mg q. afternoon, increase nighttime dose to 1000  mg.  Increase duloxetine  to 30 mg twice daily.  4. Mood/Behavior/Sleep: LCSW to follow for evaluation and support.              -antipsychotic agents: N/A  -Neuropsych consult  -07/23/23 didn't sleep well, has melatonin PRN to use  - 4-28: Sleep interrupted by pain as above.  Increasing gabapentin  and duloxetine ; may benefit from transition to Elavil nightly If no improvement  5. Neuropsych/cognition: This patient is capable of making decisions on her own behalf.  - 4-28: Dr. Cheryll Corti evaluated today; appreciate his professional assessment  6. Skin/Wound Care: Routine pressure relief measures.   - Chest port appears clean, routine dressing changes  7. Fluids/Electrolytes/Nutrition: Monitor I/O. Continue Ensure supplements and vitamin supplementation -07/22/23 hypoK+ 3.4, KCL 40meq po x 1- recheck 4/28 -07/23/23 added labs for tomorrow (Mg/phos, CMP, CK) 4/28: Hypokalemia worsened to 2.9 status post 40 mill equivalents; add 40 mill equivalents KCl daily, +40 mill equivalents IV today.  Repeat BMP tonight and tomorrow a.m. Phos elevated, so we will avoid K-Phos--repeat potassium 3.4 at 6 PM. 4-29: Hypokalemia resolved.  Repeat labs Thursday.  8. Non-specific myositis: Cymbalta  d/c by neurology due to concerns of rhabdomyolysis --Cymbalta  30 mg resumed 04/23 given improving myositis.  --Monitor CK intermittently improved from 1536-->514--> 398 --consider muscle biopsy if symptoms do not improve -07/23/23 CK to be done tomorrow and weekly on Mon 4-28: CK downtrending, continue to trend  9.  Multifocal PNA: Likely aspiration--most significant in lingula and RLL on CT w/o contrast chest 07/18/23 --Hx of chronic intermittent N/V. May need swallow evaluation. Swallow precautions added.   --started on Augmentin  04/22-->04/25 for 5 day course.  -No apparent signs of respiratory distress  11. Multiple Vitamin deficiencies: Vitamin B1  250 mg daily 4/22-->4/28 followed by 100 mg daily IV.  --now on B12 IM+PO, IV folate. Vitamin A  10,000 units daily. Vitamin D  50,000/wk, and MVI             --recheck Mg, Phos (supplemented)-- ordered for Monday 4/28--Phos improving, magnesium  stable  12. Resting tachycardia: HR has been 110-120 range at baseline --not on any BB -Start metoprolol  12.5 mg BID -07/23/23 still tachycardic but less, monitor  - intermittent, asymptomatic. monitor  13. Accelerated HTN: BP poorly controlled. Continue hydrochlorothiazide  12.5mg  daily and Avapro  150mg  daily -- addition of BB? -4/25 BB started as in #12 -07/23/23 BPs better, monitor; of note, pharmacy note states pt was on amlodipine  previously? 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 Latanoprost  and alphagan  gtts             --IVIG 04/21- 04/24. Followed by Dr. Curley Double. -07/23/23 humira, retinA, and rhopressa  listed in pharmacy note as "resume as needed in CIR or at discharge"; clarify when/if these need to be restarted while here 4-29: Should be OK to resume Retin A and Rhopressa ; will inquire about bringing from home. -- will check with Optho  about Cellcept and Humira resumption.   15. Urinary retention: Purewick being used--monitor voiding with  PVR/bladder scan. Toilet every 4 hours  -4/25 PVR 64, continue to monitor   - 4-28: No recent PVRs documented, patient denies incontinence, monitor  16. Chronic Asthma: Continue Dulera  BID and singulair  10mg  nightly, azelastin BID --Followed by Dr. Idolina Maker.  17. H/o chronic gastritis w/nausea and vomiting: D/c Ibuprofen . Continue Protonix  40 mg daily and Pepcid  40mg  nightly; was on reglan  at home, has PRN compazine here. Carafate  1g BID.  -- Followed by [facility].  18. Abnormal LFTs: Normal liver parenchyma and GB sludge noted on abdominal ultrasound 07/16/23.  --Resolving. Recheck in am-pending -07/23/23 LFTs a bit improved 4/26, repeat CMP tomorrow morning; of note, HepB SAb reactive, core Ab positive 4-28: LFTs looking better  19. Peripheral polyneuropathy: Cymbalta  resumed 04/23 as CK trending down. Will check weekly -- See #3 above; may benefit from transition to Lyrica or trial of Qutenza as outpatient.  Was establishing with Dr. Rayleen Cal  18. Anemia of chronic disease: Likely malabsorption w/ Low iron but elevated TIBC/Ferritin -07/23/23 Hgb 9.9 on Fri, repeat weekly on Mon/Thurs; iron studies done 4/21 showing low TIBC 172, iron 46, ferritin elevated 791.   19. Morbid obesity: BMI 48. Followed by RD/Bariatric nutrition for wt loss. Plan for Bariatric surgery 08/2023.              --Now on aggressive vitamin supplementation as above 20. Hyponatremia: Na down to 132-->question due to IVIG 4/31-4/24.  --May see rise in LFTs again.  -4/25 recheck labs still pending, will ask nursing to call to check on this -07/23/23 Na 131 on 4/26, recheck tomorrow to see trend or need for supplementation  4/28: NA stable 132.  21. Severe tricuspid regurgitation/grade 1 diastolic heart failure   - 4/29: add daily weights with diuretic adjustments Filed Weights   07/20/23 2100 07/21/23 0559   Weight: (!) 149 kg (!) 149 kg    22. Gout: continue allopurinol  100mg  QD; off colchicine  for now, resume as appropriate  23. Diarrhea: -07/23/23 pt now having runny stools, thinks it's the food; stop miralax  daily and change to PRN; monitor 4-28: Ongoing liquid stool; encourage p.o. fluids, replete electrolytes as above, and adding fibercon supplemen\t   - 4/29: No Bms today; slowing down. montior    LOS: 5 days A FACE TO FACE EVALUATION WAS PERFORMED  Bea Lime 07/25/2023, 9:36 AM

## 2023-07-26 ENCOUNTER — Inpatient Hospital Stay (HOSPITAL_COMMUNITY)

## 2023-07-26 MED ORDER — NETARSUDIL DIMESYLATE 0.02 % OP SOLN
1.0000 [drp] | Freq: Every day | OPHTHALMIC | Status: DC
  Administered 2023-07-26 – 2023-08-12 (×18): 1 [drp] via OPHTHALMIC
  Filled 2023-07-26: qty 1

## 2023-07-26 NOTE — Progress Notes (Signed)
 Occupational Therapy Session Note  Patient Details  Name: Tiffany Velasquez MRN: [id number] Date of Birth: Dec 06, 1995  Today's Date: 07/26/2023 OT Individual Time: 4098-1191 OT Individual Time Calculation (min): 18 min    Short Term Goals: Week 1:  OT Short Term Goal 1 (Week 1): Pt will transfer with LRAD with mod +2 to [facility] OT Short Term Goal 2 (Week 1): Pt will consistenly get to EOB with bed rail and features wtih min guard in prep for ADL OT Short Term Goal 3 (Week 1): Pt will don shirt with setup OT Short Term Goal 4 (Week 1): Pt will thread LB clothing with mod A      Therapy Documentation Precautions:  Precautions Precautions: Fall Recall of Precautions/Restrictions: Intact Precaution/Restrictions Comments: 6 falls recently Restrictions Weight Bearing Restrictions Per Provider Order: No General: General OT Amount of Missed Time: 42 Minutes Vital Signs: Therapy Vitals Temp: 98.3 F (36.8 C) Resp: 18 BP: 100/64 Patient Position (if appropriate): Lying Oxygen Therapy SpO2: 100 % O2 Device: Room Air Pain:Patient reports no LE pain at this time.   YNW:GNFAOZH received resting in bed, having finished breakfast. Patient motivated to participate and states being proud of her progress towards improved standing and walking. Patient set up for grooming tasks seated at the EOB. Patient able to wash face with set up. Patient motivated to perform bathing and dressing this morning. Following simple grooming tasks, transport arrived to take patient down for X-ray.     Therapy/Group: Individual Therapy  Marty Sleet 07/26/2023, 8:27 AM

## 2023-07-26 NOTE — Progress Notes (Signed)
 Patient ID: Tiffany Velasquez, female   DOB: Aug 08, 1995, 28 y.o.   MRN: [id number]  SW met with pt in room to provide updates from team conference, and d/c date 5/16. SW will provide updates as available.   Norval Been, MSW, LCSW Office: [phone] Cell: [phone] Fax: [phone]

## 2023-07-26 NOTE — Progress Notes (Incomplete)
 Home medication(Humira Pen) brought in by patient mother for Medical staff to evaluate and given to writer. Will be passes on to morning nurse for medical team to evaluate patient usage,

## 2023-07-26 NOTE — Progress Notes (Signed)
 PROGRESS NOTE   Subjective/Complaints: No events overnight.  Vitals are stable.  Smear overnight, ongoing incontinence but better formed.  Patient denies any incontinence, does feel that her bowel movements are improving.  3/10 foot pain overnight--patient states that she slept much better last night with current pain meds and is feeling awake and refreshed today.  Patient notes that melatonin is over sedating, advised that this is as needed.  ROS: as per HPI. Denies CP, SOB, abd pain, N/V/D/C, or any other complaints at this time.   + burning pain both feet--improved Insomnia--improved   Objective:   No results found. Recent Labs    07/24/23 0220  WBC 5.9  HGB 9.5*  HCT 30.6*  PLT 308   Recent Labs    07/24/23 1830 07/25/23 0345  NA 133* 134*  K 3.4* 3.5  CL 108 109  CO2 16* 18*  GLUCOSE 101* 105*  BUN 22* 21*  CREATININE 0.56 0.47  CALCIUM 9.3 9.4    Intake/Output Summary (Last 24 hours) at 07/26/2023 0709 Last data filed at 07/25/2023 1830 Gross per 24 hour  Intake 240 ml  Output --  Net 240 ml        Physical Exam: Vital Signs Blood pressure 100/64, pulse (!) 101, temperature 98.3 F (36.8 C), resp. rate 18, height 5\' 9"  (1.753 m), weight (!) 149 kg, SpO2 100%.    General: NAD, laying in bed.  Obese.  Laying in bed. HEENT: Head is normocephalic, atraumatic, MMM Neck: Supple without JVD or lymphadenopathy Heart: Regular rate and rhythm, no m/r/g appreciated Chest: CTA bilaterally without wheezes, rales, or rhonchi; no distress.   Abdomen: Soft, non-tender, non-distended, bowel sounds positive. Extremities: No clubbing, cyanosis, Tr b/l edema.  Psych: Dynamic affect, intermittently flat and then excited/cheerful.  skin: Clean and intact without signs of breakdown over exposed surfaces.  Right chest port C-D-I Neuro:  Awake, alert, oriented x 4.  No apparent cognitive deficits. Good insight  into current situation. Strength antigravity 4- out of 5 bilateral upper extremities, 3-/5 bilateral hip flexors, 4 out of 5 distal lower extremities. Hypersensitivity to light touch in bilateral feet/ankles.  Otherwise, sensation intact No apparent tone, no abnormal movements or tremors  Sit to stand with rolling walker with mod to max assist, takes a few steps, forward leaning and unstable gait pattern.  Unchanged 4-30  Assessment/Plan: 1. Functional deficits which require 3+ hours per day of interdisciplinary therapy in a comprehensive inpatient rehab setting. Physiatrist is providing close team supervision and 24 hour management of active medical problems listed below. Physiatrist and rehab team continue to assess barriers to discharge/monitor patient progress toward functional and medical goals  Care Tool:  Bathing    Body parts bathed by patient: Right arm, Left arm, Chest, Abdomen, Face   Body parts bathed by helper: Front perineal area, Buttocks, Right upper leg, Left upper leg, Right lower leg, Left lower leg     Bathing assist Assist Level: Maximal Assistance - Patient 24 - 49%     Upper Body Dressing/Undressing Upper body dressing   What is the patient wearing?: Hospital gown only    Upper body assist Assist Level: Total Assistance -  Patient < 25%    Lower Body Dressing/Undressing Lower body dressing    Lower body dressing activity did not occur: Environmental limitations (no clothes available)       Lower body assist       Toileting Toileting    Toileting assist Assist for toileting: Dependent - Patient 0%     Transfers Chair/bed transfer  Transfers assist     Chair/bed transfer assist level: Dependent - mechanical lift     Locomotion Ambulation   Ambulation assist   Ambulation activity did not occur: Safety/medical concerns          Walk 10 feet activity   Assist  Walk 10 feet activity did not occur: Safety/medical concerns         Walk 50 feet activity   Assist Walk 50 feet with 2 turns activity did not occur: Safety/medical concerns         Walk 150 feet activity   Assist Walk 150 feet activity did not occur: Safety/medical concerns         Walk 10 feet on uneven surface  activity   Assist Walk 10 feet on uneven surfaces activity did not occur: Safety/medical concerns         Wheelchair     Assist Is the patient using a wheelchair?: No             Wheelchair 50 feet with 2 turns activity    Assist            Wheelchair 150 feet activity     Assist          Blood pressure 100/64, pulse (!) 101, temperature 98.3 F (36.8 C), resp. rate 18, height 5\' 9"  (1.753 m), weight (!) 149 kg, SpO2 100%.  Medical Problem List and Plan: 1. 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 If unaccessed?             -ELOS/Goals: 2-4 weeks -  min A hopefully - 08/11/23             -Continue CIR   - 4/29: Having help from mom/dad at home in discharge. Mod-Max A ADLs with bed-level LB. Biggest barriers are weakness and some self-limiting behaviors. Walked 15 feet with EVA walker. Regular walker STS today.   2.  Antithrombotics: -DVT/anticoagulation:  Pharmaceutical: Lovenox  70mg  daily             -antiplatelet therapy: N/A 3. Pain Management: Has been using oxycodone  prn past 3 days? For foot, leg and back pain- also has Gabapentin  800mg  TID (increased 4/25) - duloxetine  stopped initially due to myositis- 30 mg daily resumed 4/23 -07/23/23 feet very sensitive, but cymbalta  and gabapentin  recently adjusted, lidocaine  cream not enough so asked that pharmacy send 15g tubes per treatment, to adequately cover the plantar surfaces of her feet to see if we can get relief.  4-28:  Remains very sensitive, does feel some improvement 4-29: Patient with some daytime lethargy, pain primarily at night.  Reduce daytime gabapentin  to 600 mg every morning/600 mg q. afternoon, increase nighttime dose to 1000 mg.  Increase duloxetine  to 30 mg twice daily. 4-30: Patient did much better overnight with the above changes.  4. Mood/Behavior/Sleep: LCSW to follow for evaluation  and support.              -antipsychotic agents: N/A  -Neuropsych consult  -07/23/23 didn't sleep well, has melatonin PRN to use  - 4-28: Sleep interrupted by pain as above.  Increasing gabapentin  and duloxetine ; may benefit from transition to Elavil nightly If no improvement  4-30: Sleeping better.  Thinks melatonin as needed is over sedating, advised that this is per her request.  5. Neuropsych/cognition: This patient is capable of making decisions on her own behalf.  - 4-28: Dr. Cheryll Corti evaluated today; appreciate his professional assessment  6. Skin/Wound Care: Routine pressure relief measures.   - Chest port appears clean, routine dressing changes  7. Fluids/Electrolytes/Nutrition: Monitor I/O. Continue Ensure supplements and vitamin supplementation -07/22/23 hypoK+ 3.4, KCL 40meq po x 1- recheck 4/28 -07/23/23 added labs for tomorrow (Mg/phos, CMP, CK) 4/28: Hypokalemia worsened to 2.9 status post 40 mill equivalents; add 40 mill equivalents KCl daily, +40 mill equivalents IV today.  Repeat BMP tonight and tomorrow a.m. Phos elevated, so we will avoid K-Phos--repeat potassium 3.4 at 6 PM. 4-29: Hypokalemia resolved.  Repeat labs Thursday.  8. Non-specific myositis: Cymbalta  d/c by neurology due to concerns of rhabdomyolysis --Cymbalta  30 mg resumed 04/23 given improving myositis.  --Monitor CK intermittently improved from 1536-->514--> 398 --consider muscle biopsy if symptoms do not improve -07/23/23 CK to be done tomorrow and weekly on Mon 4-28: CK downtrending, continue to trend  9. Multifocal PNA:  Likely aspiration--most significant in lingula and RLL on CT w/o contrast chest 07/18/23 --Hx of chronic intermittent N/V. May need swallow evaluation. Swallow precautions added.   --started on Augmentin  04/22-->04/25 for 5 day course.  -No apparent signs of respiratory distress  11. Multiple Vitamin deficiencies: Vitamin B1  250 mg daily 4/22-->4/28 followed by 100 mg daily IV.  --now on B12 IM+PO, IV folate. Vitamin A  10,000 units daily. Vitamin D  50,000/wk, and MVI             --recheck Mg, Phos (supplemented)-- ordered for Monday 4/28--Phos improving, magnesium  stable  12. Resting tachycardia: HR has been 110-120 range at baseline --not on any BB -Start metoprolol  12.5 mg BID -07/23/23 still tachycardic but less, monitor  - intermittent, asymptomatic. monitor  13. Accelerated HTN: BP poorly controlled. Continue hydrochlorothiazide  12.5mg  daily and Avapro  150mg  daily -- addition of BB? -4/25 BB started as in #12 -07/23/23 BPs better, monitor; of note, pharmacy note states pt was on amlodipine  previously? 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 Latanoprost  and alphagan  gtts             --IVIG 04/21- 04/24. Followed by Dr. Curley Double. -07/23/23 humira, retinA, and rhopressa  listed in pharmacy note as "resume as needed in CIR or at discharge"; clarify when/if these need to be restarted while here 4-29: Should be OK to resume Retin A and Rhopressa ; will inquire about bringing from home. -- will check with Optho about Cellcept  and Humira resumption.  4-30: Family brought Rhopressa  and Humira  in from home; pharmacy holding Humira until clarified by Optho.  Rhopressa  resumed.  15. Urinary retention: Purewick being used--monitor voiding with PVR/bladder scan. Toilet every 4 hours  -4/25 PVR 64, continue to monitor   - 4-28: No recent PVRs documented, patient denies incontinence, monitor  16. Chronic Asthma: Continue Dulera  BID and singulair  10mg  nightly, azelastin BID --Followed by Dr. Idolina Maker.  17. H/o chronic gastritis w/nausea and vomiting: D/c Ibuprofen . Continue Protonix  40 mg daily and Pepcid  40mg  nightly; was on reglan  at home, has PRN compazine here. Carafate  1g BID.  -- Followed by [facility].  18. Abnormal LFTs: Normal liver parenchyma and GB sludge noted on abdominal ultrasound 07/16/23.  --Resolving. Recheck in am-pending -07/23/23 LFTs a bit improved 4/26, repeat CMP tomorrow morning; of note, HepB SAb reactive, core Ab positive 4-28: LFTs looking better  19. Peripheral polyneuropathy: Cymbalta  resumed 04/23 as CK trending down. Will check weekly -- See #3 above; may benefit from transition to Lyrica or trial of Qutenza as outpatient.  Was establishing with Dr. Rayleen Cal  18. Anemia of chronic disease: Likely malabsorption w/ Low iron but elevated TIBC/Ferritin -07/23/23 Hgb 9.9 on Fri, repeat weekly on Mon/Thurs; iron studies done 4/21 showing low TIBC 172, iron 46, ferritin elevated 791.   19. Morbid obesity: BMI 48. Followed by RD/Bariatric nutrition for wt loss. Plan for Bariatric surgery 08/2023.              --Now on aggressive vitamin supplementation as above 20. Hyponatremia: Na down to 132-->question due to IVIG 4/31-4/24.  --May see rise in LFTs again.  -4/25 recheck labs still pending, will ask nursing to call to check on this -07/23/23 Na 131 on 4/26, recheck tomorrow to see trend or need for supplementation  4/28: NA stable 132.  21. Severe tricuspid regurgitation/grade 1 diastolic  heart failure   - 4/29: add daily weights with diuretic adjustments Filed Weights   07/20/23 2100 07/21/23 0559  Weight: (!) 149 kg (!) 149 kg    22. Gout: continue allopurinol  100mg  QD; off colchicine  for now, resume as appropriate  23. Diarrhea: -07/23/23 pt now having runny stools, thinks it's the food; stop miralax  daily and change to PRN; monitor 4-28: Ongoing liquid stool; encourage p.o. fluids, replete electrolytes as above, and adding fibercon supplemen\t   - 4/29: No Bms today; slowing down. [place]  4-30: Diarrhea followed by smears, KUB performed to ensure no overflow incontinence, was normal.    LOS: 6 days A FACE TO FACE EVALUATION WAS PERFORMED  Bea Lime 07/26/2023, 7:09 AM

## 2023-07-26 NOTE — Progress Notes (Signed)
 Physical Therapy Session Note  Patient Details  Name: Tiffany Velasquez MRN: [id number] Date of Birth: Dec 17, 1995  Today's Date: 07/26/2023 PT Individual Time: 1120-1210 and 1350-1438  PT Individual Time Calculation (min): 50 min and 48 min  Short Term Goals: Week 1:  PT Short Term Goal 1 (Week 1): Pt will roll side to side w/ supervision. PT Short Term Goal 2 (Week 1): Pt will transfer sup to sit w/ min A consistently and safely. PT Short Term Goal 3 (Week 1): Pt will transfer sit to stand from elevated surface w/ mod A +1 consistently. PT Short Term Goal 4 (Week 1): PT to assess gait.  Skilled Therapeutic Interventions/Progress Updates: Pt presented in bed agreeable to therapy. Pt states pain 5/10 in feet and intermittent pain in hamstrings L>R. Pt requesting to go outside. Pt completed bed mobility rolling to L due to power on floor on R with supervision. With significantly increased time pt completed supine to sit with supervision and use of bed rail from bed flat. Pt attempted stand from semi lowered bed however was unable to achieve full stand. Bed was elevated past height of TIS and pt was able to complete stand with light modA. Pt ambulated with minA ~41ft to TIS with RW. Pt then transported to [facility] entrance for fresh air and change of environment. Pt appeared in better spirits after a few minutes outside. Pt then participated in LAQ and hamstring pulls with red theraband. Pt expressed increased pain in L hamstring with pulls which resolved with rest. Pt transported back to room and completed stand step transfer to bed with modA. Pt required heavy modA from TIS to power up to full stand. Completed sit to supine with minA for BLE management. With PTA supporting B feet pt able to scoot anteriorly to [facility] with increased effort. Pt left in bed at end of session with call bell within reach and needs met.   Tx2: Pt presented in bed agreeable to therapy. Pt initially c/o some increased pain in B feet.  Requesting pain meds. After this therapist sent secure chat to nsg for meds pt noted to express more pain behaviors and indicating pain 8/10 in BLE. Pt stating does not think can ambulate this session. Session therefore altered to supine therex. PTA obtained physioball. Pt was able to tolerate placement of green physioball on RLE and smaller yellow physioball on LLE. Pt completed hamstring pulls ~10ea with pt expressing some increased pain on last 2-3 reps. Pt also performed SAQ AROM bilaterally and hip abd/add with plastic placed under pt's foot for decreased friction. Pt instructed in performing each activity slowly with more intention vs powerful thrusts as well as to perform diaphragmatic breathing with each movement. Pt was able to boost self up towards [facility] with PTA supporting feet. Pt left in bed at end of session with call bell within reach and needs met.      Therapy Documentation Precautions:  Precautions Precautions: Fall Recall of Precautions/Restrictions: Intact Precaution/Restrictions Comments: 6 falls recently Restrictions Weight Bearing Restrictions Per Provider Order: No General:   Vital Signs: Therapy Vitals Temp: 97.8 F (36.6 C) Temp Source: Oral Pulse Rate: 92 Resp: 17 BP: (!) 100/57 Patient Position (if appropriate): Lying Oxygen Therapy SpO2: 100 %   Therapy/Group: Individual Therapy  Draven Natter 07/26/2023, 4:14 PM

## 2023-07-26 NOTE — Progress Notes (Signed)
 Occupational Therapy Session Note  Patient Details  Name: Tiffany Velasquez MRN: [id number] Date of Birth: 12/16/1995  Today's Date: 07/26/2023 OT Individual Time: 1005-1102 OT Individual Time Calculation (min): 57 min   Short Term Goals: Week 1:  OT Short Term Goal 1 (Week 1): Pt will transfer with LRAD with mod +2 to [facility] OT Short Term Goal 2 (Week 1): Pt will consistenly get to EOB with bed rail and features wtih min guard in prep for ADL OT Short Term Goal 3 (Week 1): Pt will don shirt with setup OT Short Term Goal 4 (Week 1): Pt will thread LB clothing with mod A  Skilled Therapeutic Interventions/Progress Updates:  Pt received resting in bed for session with focus on BADL retraining. Pt with un-rated pain in B-feet, medications administered during session. Pt transitions to EOB with supervision + use of bed rails. Pt performs initial sit<>stand with multiple attempts, bed elevated, and Min A (x2). Pt unable to progress with unilateral support on RW to manage LB clothing, despite demo ability sporadically throughout static stance. Therefore dependent for task, patient then abruptly sits self back on EOB, leaning laterally, stating ". . . Sorry I got dizzy all of a sudden." Pt then requires Max A for bathe LB at EOB, able to reach anterior periarea with increased time. Pt performs UB bathing with setup/supervision, donning night-gown with increased time. Sit>stand then attempted for posterior pericare, multiple attempts required with patient needing encouragement to perform at full-potential, educated on technique to scoot back onto bed to avoid sliding forward off of bed-edge. Pt ultimately lays self back onto bed, requiring Max A for BLE elevation onto bed, and dependent for posterior pericare. Min x2 for boosting towards [facility], patient assisting with BUE/BLE. Pt remained resting in bed, all immediate needs met, call bell within reach.   Therapy Documentation Precautions:   Precautions Precautions: Fall Recall of Precautions/Restrictions: Intact Precaution/Restrictions Comments: 6 falls recently Restrictions Weight Bearing Restrictions Per Provider Order: No   Therapy/Group: Individual Therapy  Artemus Biles, OTR/L, MSOT  07/26/2023, 6:40 AM

## 2023-07-27 LAB — CK: Total CK: 102 U/L (ref 38–234)

## 2023-07-27 LAB — CBC
HCT: 30 % — ABNORMAL LOW (ref 36.0–46.0)
Hemoglobin: 9.6 g/dL — ABNORMAL LOW (ref 12.0–15.0)
MCH: 29.4 pg (ref 26.0–34.0)
MCHC: 32 g/dL (ref 30.0–36.0)
MCV: 92 fL (ref 80.0–100.0)
Platelets: 395 10*3/uL (ref 150–400)
RBC: 3.26 MIL/uL — ABNORMAL LOW (ref 3.87–5.11)
RDW: 22.1 % — ABNORMAL HIGH (ref 11.5–15.5)
WBC: 4.9 10*3/uL (ref 4.0–10.5)
nRBC: 0 % (ref 0.0–0.2)

## 2023-07-27 LAB — VITAMIN K1, SERUM: VITAMIN K1: 0.17 ng/mL (ref 0.10–2.20)

## 2023-07-27 LAB — BASIC METABOLIC PANEL WITH GFR
Anion gap: 15 (ref 5–15)
BUN: 27 mg/dL — ABNORMAL HIGH (ref 6–20)
CO2: 13 mmol/L — ABNORMAL LOW (ref 22–32)
Calcium: 9.3 mg/dL (ref 8.9–10.3)
Chloride: 106 mmol/L (ref 98–111)
Creatinine, Ser: 0.56 mg/dL (ref 0.44–1.00)
GFR, Estimated: >60 mL/min (ref >60)
Glucose, Bld: 102 mg/dL — ABNORMAL HIGH (ref 70–99)
Potassium: 2.9 mmol/L — ABNORMAL LOW (ref 3.5–5.1)
Sodium: 134 mmol/L — ABNORMAL LOW (ref 135–145)

## 2023-07-27 LAB — MAGNESIUM: Magnesium: 1.8 mg/dL (ref 1.7–2.4)

## 2023-07-27 MED ORDER — POTASSIUM CHLORIDE CRYS ER 20 MEQ PO TBCR
40.0000 meq | EXTENDED_RELEASE_TABLET | Freq: Every day | ORAL | Status: DC
Start: 2023-07-27 — End: 2023-07-31
  Administered 2023-07-27 – 2023-07-31 (×5): 40 meq via ORAL
  Filled 2023-07-27 (×5): qty 2

## 2023-07-27 MED ORDER — IRBESARTAN 75 MG PO TABS
75.0000 mg | ORAL_TABLET | Freq: Every day | ORAL | Status: DC
  Administered 2023-07-28: 75 mg via ORAL
  Filled 2023-07-27: qty 1

## 2023-07-27 MED ORDER — POTASSIUM CHLORIDE 10 MEQ/100ML IV SOLN
10.0000 meq | INTRAVENOUS | Status: AC
  Administered 2023-07-27 (×2): 10 meq via INTRAVENOUS
  Filled 2023-07-27 (×2): qty 100

## 2023-07-27 MED ORDER — SODIUM CHLORIDE 0.9 % IV BOLUS
500.0000 mL | Freq: Once | INTRAVENOUS | Status: AC
  Administered 2023-07-27: 500 mL via INTRAVENOUS

## 2023-07-27 NOTE — Progress Notes (Signed)
 BP has soft at times during the day. Was 90/66 this am prior to Irbersartan 150 mg and dropped to 70's after therapy. Encourage fluid intake. Bedside therapy today till BP improves. Will decrease dose to 75 mg and order orthostatic vitals.

## 2023-07-27 NOTE — Progress Notes (Signed)
 Physical Therapy Session Note  Patient Details  Name: Tiffany Velasquez MRN: [id number] Date of Birth: [birth year]  Today's Date: 07/27/2023 PT Individual Time: 7829-5621 and 1105-1200 PT Individual Time Calculation (min): 70 min and 55 min  Short Term Goals: Week 1:  PT Short Term Goal 1 (Week 1): Pt will roll side to side w/ supervision. PT Short Term Goal 2 (Week 1): Pt will transfer sup to sit w/ min A consistently and safely. PT Short Term Goal 3 (Week 1): Pt will transfer sit to stand from elevated surface w/ mod A +1 consistently. PT Short Term Goal 4 (Week 1): PT to assess gait.  Skilled Therapeutic Interventions/Progress Updates: Pt presented sitting EOB agreeable to therapy. Pt states received pain meds and currently denies pain. With bed elevated pt completed Sit to stand with minA and ambulated to sink. Pt then completed oral hygiene w/c level mod I. Pt then transported to day room and worked on ambulation for endurance. Pt ambulated ~60ft with RW and CGA. Pt required modA for Sit to stand from TIS requiring increased effort to complete power up. After extended seated rest pt ambulated and additional 10ft CGA however pt required less (still modA) to stand. Pt also participated in ball taps with 1lb dowel 2 x 10 for increased activity tolerance. Pt transported back to room and completed stand with minA and completed stand step transfer to bed. At EOB pt completed sit to supine with supervision and increased time/effort. Pt left in bed at end of session with call bell within reach and needs met.   Tx2: Pt presented in bed agreeable to therapy. Pt denies pain at rest. Performed supine to sit to R with supervision, increased time and use of bed features. Bed elevated and pt performed Sit to stand with minA and ambulated to TIS. Pt transported to [facility] entrance for environmental change. While outside performed LAQ 2 x 10. Upon return to unit pt participated in dance ground with interacting with  group and participating in UE/LE therex. Pt noted to have lightened mood after activity but therapist noting frequent rest breaks towards end of group therefore left early. In room completed Sit to stand with minA and ambulatory transfer to EOB. While sitting EOB pt c/o lightheadedness. Pt required to supine with minA with nsg in room and vitals taken. BP 76/52 (61) in supine. Nsg checked BP x 3 with it remaining 70s/50s however symptoms resolved. Pt left resting in bed at end of session with nsg present and needs met.      Therapy Documentation Precautions:  Precautions Precautions: Fall Recall of Precautions/Restrictions: Intact Precaution/Restrictions Comments: 6 falls recently Restrictions Weight Bearing Restrictions Per Provider Order: No General:   Vital Signs: Therapy Vitals BP: (!) 135/96 Patient Position (if appropriate): Sitting Oxygen Therapy SpO2: 100 % O2 Device: Room Air Pain:   Mobility:   Locomotion :    Trunk/Postural Assessment :    Balance:   Exercises:   Other Treatments:      Therapy/Group: Individual Therapy  Jeffifer Rabold 07/27/2023, 4:47 PM

## 2023-07-27 NOTE — Progress Notes (Signed)
 Occupational Therapy Session Note  Patient Details  Name: Tiffany Velasquez MRN: [id number] Date of Birth: 11/20/1995  Today's Date: 07/28/2023 OT Individual Time: 6578-4696 OT Individual Time Calculation (min): 55 min   Today's Date: 07/28/2023 OT Individual Time: 1450-1530 OT Individual Time Calculation (min): 40 min   Short Term Goals: Week 1:  OT Short Term Goal 1 (Week 1): Pt will transfer with LRAD with mod +2 to [facility] OT Short Term Goal 2 (Week 1): Pt will consistenly get to EOB with bed rail and features wtih min guard in prep for ADL OT Short Term Goal 3 (Week 1): Pt will don shirt with setup OT Short Term Goal 4 (Week 1): Pt will thread LB clothing with mod A  Skilled Therapeutic Interventions/Progress Updates:   Session 1: Pt received sitting EOB, un-rated pain in B-feet, medication administered during session. At EOB, pt uses long-handled sponge to bathe BLE, Min A provided for thoroughness, pt reaches B upper legs and anterior periarea reclined on bed with setup A. In standing, pt dependent for posterior periarea. Pt able to thread BLE into shorts, dependent for standing hike. Pt performs sit<>stands from heightened bed with Min A + RW. Stand-step transfer from EOB>TIS WC ~5 ft with CGA + RW (+2 present for safety). Sink-side grooming and UB dressing with setup/supervision. Pt remained resting in TIS WC with all immediate needs met and call bell within reach.    Session 2:  Pt received resting in bed, complaints of pain in L-knee, rest provided as needed. Session dedicated to assessing BP with mobility, TEDs donned dependently. See details below:   Supine BP=90/65 (74) HR=82  Sitting EOB BP=103/75 HR=96 (up to 127 in sitting)  Sitting EOB after ~5 mins  BP=113/69 HR=91  Post-sit-stand BP=104/60 (79) HR=90  Post x36ft of ambulation BP=112/83 (91) HR=94  Pt requires cuing to avoid holding her breath while powering up to stance, overall Min (x2) for x3 STS from  heightened EOB. Supine>sit EOB with supervision + bed rails, sit EOB>supine with Min A for BLE elevation. Pt remained resting in bed with all immediate needs met.   Therapy Documentation Precautions:  Precautions Precautions: Fall Recall of Precautions/Restrictions: Intact Precaution/Restrictions Comments: 6 falls recently Restrictions Weight Bearing Restrictions Per Provider Order: No   Therapy/Group: Individual Therapy  Artemus Biles, OTR/L, MSOT  07/28/2023, 12:32 PM

## 2023-07-27 NOTE — Progress Notes (Signed)
 Occupational Therapy Weekly Progress Note  Patient Details  Name: Tiffany Velasquez MRN: [id number] Date of Birth: 1995-04-22  Beginning of progress report period: July 21, 2023 End of progress report period: Jul 28, 2023   Patient has met 3 of 3 short term goals this reporting period. Pt currently requires Min-Mod A for LB ADLs, including toileting, and performing UB ADLs with setup/supervision. Pt transfers at Min-Mod A, utilizing RW, decreased assistance required when standing from elevated surface. Pt's caregivers intermediately present throughout therapy week, plan for official caregiver education closer to discharge.   Patient continues to demonstrate the following deficits: muscle weakness and muscle joint tightness, decreased cardiorespiratoy endurance, unbalanced muscle activation and decreased coordination, decreased visual acuity, decreased problem solving and decreased safety awareness, and decreased standing balance, decreased postural control, and decreased balance strategies and therefore will continue to benefit from skilled OT intervention to enhance overall performance with BADL and Reduce care partner burden.  Patient progressing toward long term goals..  Continue plan of care.  OT Short Term Goals Week 1:  OT Short Term Goal 1 (Week 1): Pt will transfer with LRAD with mod +2 to [facility] OT Short Term Goal 1 - Progress (Week 1): Met OT Short Term Goal 2 (Week 1): Pt will consistenly get to EOB with bed rail and features wtih min guard in prep for ADL OT Short Term Goal 2 - Progress (Week 1): Met OT Short Term Goal 3 (Week 1): Pt will don shirt with setup OT Short Term Goal 3 - Progress (Week 1): Met OT Short Term Goal 4 (Week 1): Pt will thread LB clothing with mod A OT Short Term Goal 4 - Progress (Week 1): Met Week 2:  OT Short Term Goal 1 (Week 2): Pt will hike LB garments with Min A + LRAD. OT Short Term Goal 2 (Week 2): Pt will perform 1/3 toileting activities with Min A +  LRAD. OT Short Term Goal 3 (Week 2): Pt will perform toilet transfer with consistent Min A + LRAD.   Therapy Documentation Precautions:  Precautions Precautions: Fall Recall of Precautions/Restrictions: Intact Precaution/Restrictions Comments: 6 falls recently Restrictions Weight Bearing Restrictions Per Provider Order: No   Therapy/Group: Individual Therapy  Artemus Biles, OTR/L, MSOT  07/28/2023, 12:40 PM

## 2023-07-27 NOTE — Evaluation (Signed)
 Recreational Therapy Assessment and Plan  Patient Details  Name: Tiffany Velasquez MRN: [id number] Date of Birth: 07/08/1995 Today's Date: 07/27/2023  Rehab Potential:  Good ELOS:   d/c 5//16  Assessment Hospital Problem: Principal Problem:   Myositis associated antibody positive Active Problems:   Myositis     Past Medical History:      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        Past Surgical History:       Past Surgical History:  Procedure Laterality Date   BIOPSY   04/04/2023    Procedure: BIOPSY;  Surgeon: Junie Olds, MD;  Location: [facility];  Service: General;;   CATARACT EXTRACTION       ESOPHAGOGASTRODUODENOSCOPY N/A 04/04/2023    Procedure: ESOPHAGOGASTRODUODENOSCOPY (EGD);  Surgeon: Junie Olds, MD;  Location: [facility];  Service: General;  Laterality: N/A;   PORTA CATH INSERTION              Assessment & Plan Clinical Impression: Patient is a 28 y.o. 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 Per reports falls X 6 in 2 weeks  with lack of feeling in legs and occasional dizziness when standing. She denied  She was sent to ED for evaluation. She was found to have low folate, low vitamin D  and Vitamin A , elevated CRP, low BI <6. She was started on IV thiamine  qid,Vitamin A  IM, IV  folate, B12 injections and Vitamin D  supplement.MRI brain and spine without  acute abnormality.  CT chest done due to intermittent tachycardia and she was also found to have multifocal PNA treated with 5 day course of Augmentin . 2 D echo showed EF 55% with moderate to severe TVR and G1DD.     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 Discontinuation of duloxetine  (as can contribute to rhabdo)and muscle biopsy recommended initially by Dr.Stack but patient has had improvement with aggressive vitamin supplementation symptoms felt to be due to nutritional etiology though infectious or inflammatory etiology questioned. Lidocaine  gel added for chronic foot pain and colchicine  d/c.    Pt reports severe painful peripheral neuropathy in her feet and generalized LE>UE weakness.  Patient transferred to CIR on 07/20/2023 .     Pt presents with decreased activity tolerance, decreased functional mobility, decreased balance, acute pain, feelings of stress/anxiety Limiting pt's independence with leisure/community pursuits. Met with pt today to discuss TR services including leisure education, activity analysis/modifications and stress management.  Also discussed the importance of social, emotional, spiritual health in addition to physical health and their effects on overall health and wellness.  Pt stated understanding.  Plan  Min 1 TR session pre week during LOS  Recommendations for other services: Neuropsych  Discharge Criteria: Patient will be discharged from TR if patient refuses treatment 3 consecutive times without medical reason.  If  treatment goals not met, if there is a change in medical status, if patient makes no progress towards goals or if patient is discharged from hospital.  The above assessment, treatment plan, treatment alternatives and goals were discussed and mutually agreed upon: by patient  Tiffany Velasquez 07/27/2023, 9:27 AM

## 2023-07-27 NOTE — Progress Notes (Signed)
 PROGRESS NOTE   Subjective/Complaints: No events overnight.  Patient very happy regarding her progress, taking several steps each day. Very soft blood pressures with diastolic in the 50s and systolic in the low 100s to 90s.  Other vital stable.  Minimal p.o. fluid intakes. Labs significant for potassium back to 2.9 today, stable mild hyponatremia, BUN elevated to 27 and creatinine stable.  Otherwise, unchanged. Last bowel movement 4-28 per record--KUB yesterday was normal.   ROS: as per HPI. Denies CP, SOB, abd pain, N/V/D/C, or any other complaints at this time.   + burning pain both feet--improved Insomnia--improved   Objective:   DG Abd 1 View Result Date: 07/26/2023 CLINICAL DATA:  Incontinence EXAM: ABDOMEN - 1 VIEW COMPARISON:  03/10/2005 FINDINGS: The bowel gas pattern is normal. No radio-opaque calculi or other significant radiographic abnormality are seen. IMPRESSION: No acute abnormality noted. Electronically Signed   By: Violeta Grey M.D.   On: 07/26/2023 10:15   Recent Labs    07/27/23 0320  WBC 4.9  HGB 9.6*  HCT 30.0*  PLT 395   Recent Labs    07/25/23 0345 07/27/23 0320  NA 134* 134*  K 3.5 2.9*  CL 109 106  CO2 18* 13*  GLUCOSE 105* 102*  BUN 21* 27*  CREATININE 0.47 0.56  CALCIUM  9.4 9.3    Intake/Output Summary (Last 24 hours) at 07/27/2023 0941 Last data filed at 07/26/2023 1654 Gross per 24 hour  Intake 120 ml  Output --  Net 120 ml        Physical Exam: Vital Signs Blood pressure 90/66, pulse 99, temperature 97.9 F (36.6 C), temperature source Oral, resp. rate 19, height 5\' 9"  (1.753 m), weight (!) 149 kg, last menstrual period 06/27/2023, SpO2 99%.    General: NAD, laying in bed.  Obese.  Sitting up in wheelchair. HEENT: Head is normocephalic, atraumatic, MMM Neck: Supple without JVD or lymphadenopathy Heart: Regular rate and rhythm, no m/r/g appreciated Chest: CTA bilaterally  without wheezes, rales, or rhonchi; no distress.   Abdomen: Soft, non-tender, non-distended, bowel sounds positive. Extremities: No clubbing, cyanosis, Tr b/l edema.  Psych: Dynamic affect, intermittently flat and then excited/cheerful.  skin: Clean and intact without signs of breakdown over exposed surfaces.  Right chest port C-D-I Neuro:  Awake, alert, oriented x 4.  No apparent cognitive deficits. Good insight into current situation. Strength antigravity 4- out of 5 bilateral upper extremities, 4-/5 bilateral hip flexors, 4 out of 5 distal lower extremities. Hypersensitivity to light touch in bilateral feet/ankles.  Otherwise, sensation intact No apparent tone, no abnormal movements or tremors   Assessment/Plan: 1. Functional deficits which require 3+ hours per day of interdisciplinary therapy in a comprehensive inpatient rehab setting. Physiatrist is providing close team supervision and 24 hour management of active medical problems listed below. Physiatrist and rehab team continue to assess barriers to discharge/monitor patient progress toward functional and medical goals  Care Tool:  Bathing    Body parts bathed by patient: Right arm, Left arm, Chest, Abdomen, Front perineal area, Face   Body parts bathed by helper: Buttocks, Right upper leg, Left upper leg, Right lower leg, Left lower leg  Bathing assist Assist Level: Moderate Assistance - Patient 50 - 74%     Upper Body Dressing/Undressing Upper body dressing   What is the patient wearing?:  (Night-gown)    Upper body assist Assist Level: Supervision/Verbal cueing    Lower Body Dressing/Undressing Lower body dressing    Lower body dressing activity did not occur: Environmental limitations (no clothes available)       Lower body assist       Toileting Toileting    Toileting assist Assist for toileting: Dependent - Patient 0%     Transfers Chair/bed transfer  Transfers assist     Chair/bed transfer  assist level: Dependent - mechanical lift     Locomotion Ambulation   Ambulation assist   Ambulation activity did not occur: Safety/medical concerns          Walk 10 feet activity   Assist  Walk 10 feet activity did not occur: Safety/medical concerns        Walk 50 feet activity   Assist Walk 50 feet with 2 turns activity did not occur: Safety/medical concerns         Walk 150 feet activity   Assist Walk 150 feet activity did not occur: Safety/medical concerns         Walk 10 feet on uneven surface  activity   Assist Walk 10 feet on uneven surfaces activity did not occur: Safety/medical concerns         Wheelchair     Assist Is the patient using a wheelchair?: No             Wheelchair 50 feet with 2 turns activity    Assist            Wheelchair 150 feet activity     Assist          Blood pressure 90/66, pulse 99, temperature 97.9 F (36.6 C), temperature source Oral, resp. rate 19, height 5\' 9"  (1.753 m), weight (!) 149 kg, last menstrual period 06/27/2023, SpO2 99%.  Medical Problem List and Plan: 1. 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 If unaccessed?             -ELOS/Goals: 2-4 weeks -  min A hopefully - 08/11/23             -Continue CIR   - 4/29: Having help from mom/dad at home in discharge. Mod-Max A ADLs with bed-level LB. Biggest barriers are weakness and some self-limiting behaviors. Walked 15 feet with EVA walker. Regular walker STS today.   2.  Antithrombotics: -DVT/anticoagulation:  Pharmaceutical: Lovenox  70mg  daily             -antiplatelet therapy: N/A 3. Pain Management: Has been using oxycodone  prn past 3 days? For foot, leg and back pain- also has Gabapentin  800mg  TID (increased  4/25) - duloxetine  stopped initially due to myositis- 30 mg daily resumed 4/23 -07/23/23 feet very sensitive, but cymbalta  and gabapentin  recently adjusted, lidocaine  cream not enough so asked that pharmacy send 15g tubes per treatment, to adequately cover the plantar surfaces of her feet to see if we can get relief.  4-28: Remains very sensitive, does feel some improvement 4-29: Patient with some daytime lethargy, pain primarily at night.  Reduce daytime gabapentin  to 600 mg every morning/600 mg q. afternoon, increase nighttime dose to 1000 mg.  Increase duloxetine  to 30 mg twice daily. 4-30: Patient did much better overnight with the above changes. 5-1: Discussed with patient further weaning daytime gabapentin ; she feels good on current regimen and wishes to hold off at this time  4. Mood/Behavior/Sleep: LCSW to follow for evaluation and support.              -antipsychotic agents: N/A  -Neuropsych consult  -07/23/23 didn't sleep well, has melatonin PRN to use  - 4-28: Sleep interrupted by pain as above.  Increasing gabapentin  and duloxetine ; may benefit from transition to Elavil nightly If no improvement  4-30: Sleeping better.  Thinks melatonin as needed is over sedating, advised that this is per her request.  5. Neuropsych/cognition: This patient is capable of making decisions on her own behalf.  - 4-28: Dr. Cheryll Corti evaluated today; appreciate his professional assessment  6. Skin/Wound Care: Routine pressure relief measures.   - Chest port appears clean, routine dressing changes  7. Fluids/Electrolytes/Nutrition: Monitor I/O. Continue Ensure supplements and vitamin supplementation -07/22/23 hypoK+ 3.4, KCL 40meq po x 1- recheck 4/28 -07/23/23 added labs for tomorrow (Mg/phos, CMP, CK) 4/28: Hypokalemia worsened to 2.9 status post 40 mill equivalents; add 40 mill equivalents KCl PO, +40 mill equivalents IV today.  Repeat BMP tonight and tomorrow a.m. Phos elevated, so we will avoid  K-Phos--repeat potassium 3.4 at 6 PM. 4-29: Hypokalemia resolved.  Repeat labs Thursday. 5/1: Potassium back to 2.9.  DC HCTZ.  Add 40 mill equivalents daily p.o. repletion, another 20 mill equivalents IV today.  Repeat labs in AM.  8. Non-specific myositis: Cymbalta  d/c by neurology due to concerns of rhabdomyolysis --Cymbalta  30 mg resumed 04/23 given improving myositis.  --Monitor CK intermittently improved from 1536-->514--> 398 --consider muscle biopsy if symptoms do not improve -07/23/23 CK to be done tomorrow and weekly on Mon 4-28: CK downtrending, continue to trend 5/1: Pending  9. Multifocal PNA: Likely aspiration--most significant in lingula and RLL on CT w/o contrast chest 07/18/23 --Hx of chronic intermittent N/V. May need swallow evaluation. Swallow precautions added.   --started on Augmentin  04/22-->04/25 for 5 day course.  -No apparent signs of respiratory distress  11. Multiple Vitamin deficiencies: Vitamin B1  250 mg daily 4/22-->4/28 followed by 100 mg daily IV.  --now on B12 IM+PO, IV folate. Vitamin A  10,000 units daily. Vitamin D  50,000/wk, and MVI             --recheck Mg, Phos (supplemented)-- ordered for Monday 4/28--Phos improving, magnesium  stable  12. Resting tachycardia: HR has been 110-120 range at baseline --not on any BB -Start metoprolol  12.5 mg BID -07/23/23 still tachycardic but less, monitor  - intermittent, asymptomatic. monitor  13. Accelerated HTN: BP poorly controlled. Continue hydrochlorothiazide  12.5mg  daily and Avapro  150mg  daily -- addition of BB? -4/25 BB started as in #12 -07/23/23 BPs better, monitor; of note, pharmacy note states pt was on amlodipine  previously? Remain off for now, but note this for d/c 4/28 : reduce HCTZ to 6.125 due to recurrent hypokalemia--BP stable 5/1: BUN elevated, blood pressure soft, hypokalemia as above; DC hydrochlorothiazide .  Got 500 cc IV fluid bolus this afternoon.  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 Latanoprost  and alphagan  gtts             --IVIG 04/21- 04/24. Followed by Dr. Curley Double. -07/23/23 humira, retinA, and rhopressa  listed in pharmacy note as "resume as needed in CIR or at discharge"; clarify when/if these need to be restarted while here 4-29: Should be OK to resume Retin A and Rhopressa ; will inquire about bringing from home. -- will check with Optho about Cellcept and Humira resumption.  4-30: Family brought Rhopressa  and Humira in from home; pharmacy holding Humira until clarified by Optho.  Rhopressa  resumed. 5-1: Spoke with Dr. Mason Sole, hold Humira and CellCept pending outpatient follow-up with him shortly after discharge.  15. Urinary retention: Purewick being used--monitor voiding with PVR/bladder scan. Toilet every 4 hours  -4/25 PVR 64, continue to monitor   - 4-28: No recent PVRs documented, patient denies incontinence, monitor  16. Chronic Asthma: Continue Dulera  BID and singulair  10mg  nightly, azelastin BID --Followed by Dr. Idolina Maker.  17. H/o chronic gastritis w/nausea and vomiting: D/c Ibuprofen . Continue Protonix  40 mg daily and Pepcid  40mg  nightly; was on reglan  at home, has PRN compazine  here. Carafate  1g BID.  -- Followed by [facility].  18. Abnormal LFTs: Normal liver parenchyma and GB sludge noted on abdominal ultrasound 07/16/23.  --Resolving. Recheck in am-pending - 07/23/23 LFTs a bit improved 4/26, repeat CMP tomorrow morning; of note, HepB SAb reactive, core Ab positive 4-28: LFTs looking better  19. Peripheral polyneuropathy: Cymbalta  resumed 04/23 as CK trending down. Will check weekly --  See #3 above; may benefit from transition to Lyrica or trial of Qutenza as outpatient.  Was establishing with Dr. Rayleen Cal  18. Anemia of chronic disease: Likely malabsorption w/ Low iron but elevated TIBC/Ferritin -07/23/23 Hgb 9.9 on Fri, repeat weekly on Mon/Thurs; iron studies done 4/21 showing low TIBC 172, iron 46, ferritin elevated 791.   19. Morbid obesity: BMI 48. Followed by RD/Bariatric nutrition for wt loss. Plan for Bariatric surgery 08/2023.              --Now on aggressive vitamin supplementation as above 20. Hyponatremia: Na down to 132-->question due to IVIG 4/31-4/24.  --May see rise in LFTs again.  -4/25 recheck labs still pending, will ask nursing to call to check on this -07/23/23 Na 131 on 4/26, recheck tomorrow to see trend or need for supplementation  4/28: NA stable 132. 5/1: Na 134, stable  21. Severe tricuspid regurgitation/grade 1 diastolic heart failure   - 4/29: add daily weights with diuretic adjustments--stable Filed Weights   07/20/23 2100 07/21/23 0559 07/27/23 0500  Weight: (!) 149 kg (!) 149 kg (!) 149 kg    22. Gout: continue allopurinol  100mg  QD; off colchicine  for now, resume as appropriate   23. Diarrhea/constipation: -07/23/23 pt now having runny stools, thinks it's the food; stop miralax  daily and change to PRN; monitor 4-28: Ongoing liquid stool; encourage p.o. fluids, replete electrolytes as above, and adding fibercon supplemen\t   - 4/29: No Bms today; slowing down. [place]  - 4-30: Diarrhea followed by smears, KUB performed to ensure no overflow incontinence, was normal.  5/1: KUB normal, patient feels she is having adequate bowel movements.    LOS: 7 days A FACE TO FACE EVALUATION WAS PERFORMED  Bea Lime 07/27/2023, 9:41 AM

## 2023-07-27 NOTE — Plan of Care (Signed)
 Problem: Education: Goal: Knowledge of General Education information will improve Description: Including pain rating scale, medication(s)/side effects and non-pharmacologic comfort measures Outcome: Progressing Outcome: Progressing   Problem: Health Behavior/Discharge Planning: Goal: Ability to manage health-related needs will improve Outcome: Progressing Outcome: Progressing   Problem: Clinical Measurements: Goal: Ability to maintain clinical measurements within normal limits will improve Outcome: Progressing Outcome: Progressing  Goal: Will remain free from infection Outcome: Progressing Outcome: Progressing  Goal: Diagnostic test results will improve Outcome: Progressing Outcome: Progressing  Goal: Respiratory complications will improve Outcome: Progressing Outcome: Progressing  Goal: Cardiovascular complication will be avoided Outcome: Progressing Outcome: Progressing   Problem: Activity: Goal: Risk for activity intolerance will decrease 07/21/2023 1010 by Diamond Formica, RN Outcome: Progressing 07/21/2023 1010 by Diamond Formica, RN Outcome: Progressing   Problem: Nutrition: Goal: Adequate nutrition will be maintained Outcome: Progressing Outcome: Progressing   Problem: Coping: Goal: Level of anxiety will decrease Outcome: Progressing Outcome: Progressing   Problem: Elimination: Goal: Will not experience complications related to bowel motility Outcome: Progressing Outcome: Progressing  Goal: Will not experience complications related to urinary retention Outcome: Progressing Outcome: Progressing   Problem: Pain Managment: Goal: General experience of comfort will improve and/or be controlled Outcome: Progressing Outcome: Progressing   Problem: Safety: Goal: Ability to remain free from injury will improve Outcome: Progressing Outcome: Progressing   Problem: Education: Goal: Knowledge of disease or condition will improve Outcome: Progressing Outcome:  Progressing  Goal: Knowledge of secondary prevention will improve (MUST DOCUMENT ALL) Outcome: Progressing Outcome: Progressing  Goal: Knowledge of patient specific risk factors will improve (DELETE if not current risk factor) Outcome: Progressing Outcome: Progressing   Problem: Ischemic Stroke/TIA Tissue Perfusion: Goal: Complications of ischemic stroke/TIA will be minimized Outcome: Progressing Outcome: Progressing   Problem: Coping: Goal: Will identify appropriate support needs Outcome: Progressing Outcome: Progressing   Problem: Self-Care: Goal: Ability to participate in self-care as condition permits will improve Outcome: Progressing Outcome: Progressing  Goal: Verbalization of feelings and concerns over difficulty with self-care will improve Outcome: Progressing Outcome: Progressing

## 2023-07-27 NOTE — Progress Notes (Signed)
 Occupational Therapy Session Note  Patient Details  Name: Tiffany Velasquez MRN: [id number] Date of Birth: 1995-07-16  Today's Date: 07/27/2023 OT Individual Time: 8657-8469 OT Individual Time Calculation (min): 82 min    Short Term Goals: Week 1:  OT Short Term Goal 1 (Week 1): Pt will transfer with LRAD with mod +2 to [facility] OT Short Term Goal 2 (Week 1): Pt will consistenly get to EOB with bed rail and features wtih min guard in prep for ADL OT Short Term Goal 3 (Week 1): Pt will don shirt with setup OT Short Term Goal 4 (Week 1): Pt will thread LB clothing with mod A  Skilled Therapeutic Interventions/Progress Updates:  Pt received resting in bed, LPN present to assess vitals due to low BP earlier in the session. Pt with reports of pain in L-calf, rest/positioning suggestions provided throughout session. Pt receptive to activity, completing bed mobility with supervision to light Min A + bed rails. Pt with lowest BP noted as 100/68, improving with activity and no orthostasis noted with sit<>stand, transfers at Min-Mod A (+2) with bed elevated. Dependent for standing hike of LB garments. Due to fatigue, pt returns to supine, performing 2x5 sets of glute bridges, hip abductions/adductions, and 2x10 ankle pumps. +2 then available, pt ambulating ~77ft with CGA + RW. Pt requires x4 people to stand from low chair, after making multiple attempts to stand with x2. Pt dependent for transport back to room, unable to perform sit>stand from [facility], maximove used for transfer back to bed. Pt remained resting in bed with all immediate needs met.   Therapy Documentation Precautions:  Precautions Precautions: Fall Recall of Precautions/Restrictions: Intact Precaution/Restrictions Comments: 6 falls recently Restrictions Weight Bearing Restrictions Per Provider Order: No   Therapy/Group: Individual Therapy  Artemus Biles, OTR/L, MSOT  07/27/2023, 4:25 PM

## 2023-07-27 NOTE — Progress Notes (Signed)
 Patient ID: Tiffany Velasquez, female   DOB: [birth year], 28 y.o.   MRN: [id number]  [facility]; resumption orders will be needed.   Norval Been, MSW, LCSW Office: [phone] Cell: [phone] Fax: [phone]

## 2023-07-28 ENCOUNTER — Inpatient Hospital Stay (HOSPITAL_COMMUNITY)

## 2023-07-28 LAB — BASIC METABOLIC PANEL WITH GFR
Anion gap: 7 (ref 5–15)
BUN: 23 mg/dL — ABNORMAL HIGH (ref 6–20)
CO2: 16 mmol/L — ABNORMAL LOW (ref 22–32)
Calcium: 9 mg/dL (ref 8.9–10.3)
Chloride: 108 mmol/L (ref 98–111)
Creatinine, Ser: 0.56 mg/dL (ref 0.44–1.00)
GFR, Estimated: >60 mL/min (ref >60)
Glucose, Bld: 110 mg/dL — ABNORMAL HIGH (ref 70–99)
Potassium: 3.1 mmol/L — ABNORMAL LOW (ref 3.5–5.1)
Sodium: 131 mmol/L — ABNORMAL LOW (ref 135–145)

## 2023-07-28 MED ORDER — SODIUM CHLORIDE 0.9 % IV SOLN
Freq: Once | INTRAVENOUS | Status: AC
Start: 2023-07-28 — End: 2023-07-28

## 2023-07-28 NOTE — Progress Notes (Signed)
 PROGRESS NOTE   Subjective/Complaints: No events overnight.   BP looking a bit better; Losartan reduced to 75 mg daily overnight.  Patient this a.m. with a syncopal episode while on the toilet, does endorse that she was straining to have a solid bowel movement.  Event was witnessed, and she was assisted to the floor with no initial pain.  On examination, symptoms have resolved since getting back in bed, but she is very upset about the "setback".  BMP pending for K; CK normalized yesterday  ROS: as per HPI. Denies CP, SOB, abd pain, N/V/D/C, or any other complaints at this time.   + burning pain both feet--improved + Insomnia, improved + Orthostatic hypotension, ongoing   Objective:   No results found.  Recent Labs    07/27/23 0320  WBC 4.9  HGB 9.6*  HCT 30.0*  PLT 395   Recent Labs    07/27/23 0320  NA 134*  K 2.9*  CL 106  CO2 13*  GLUCOSE 102*  BUN 27*  CREATININE 0.56  CALCIUM  9.3    Intake/Output Summary (Last 24 hours) at 07/28/2023 0943 Last data filed at 07/28/2023 0505 Gross per 24 hour  Intake 237 ml  Output 354 ml  Net -117 ml        Physical Exam: Vital Signs Blood pressure 109/77, pulse 79, temperature 97.7 F (36.5 C), resp. rate 18, height 5\' 9"  (1.753 m), weight (!) 147.8 kg, last menstrual period 06/27/2023, SpO2 100%.    General: NAD, laying in bed.  Obese.  Sitting up in wheelchair. HEENT: Head is normocephalic, atraumatic, MMM Neck: Supple without JVD or lymphadenopathy Heart: Regular rate and rhythm, no m/r/g appreciated Chest: CTA bilaterally without wheezes, rales, or rhonchi; no distress.   Abdomen: Soft, non-tender, non-distended, bowel sounds positive. Extremities: No clubbing, cyanosis, Tr b/l edema.  Psych: Dynamic affect, intermittently flat and then excited/cheerful.  skin: Clean and intact without signs of breakdown over exposed surfaces.  Right chest port  C-D-I Neuro:  Awake, alert, oriented x 4.  No apparent cognitive deficits. Good insight into current situation. Strength antigravity 4- out of 5 bilateral upper extremities, 4-/5 bilateral hip flexors, 4 out of 5 distal lower extremities. Hypersensitivity to light touch in bilateral feet/ankles.  Otherwise, sensation intact No apparent tone, no abnormal movements or tremors   Assessment/Plan: 1. Functional deficits which require 3+ hours per day of interdisciplinary therapy in a comprehensive inpatient rehab setting. Physiatrist is providing close team supervision and 24 hour management of active medical problems listed below. Physiatrist and rehab team continue to assess barriers to discharge/monitor patient progress toward functional and medical goals  Care Tool:  Bathing    Body parts bathed by patient: Right arm, Left arm, Chest, Abdomen, Front perineal area, Face   Body parts bathed by helper: Buttocks, Right upper leg, Left upper leg, Right lower leg, Left lower leg     Bathing assist Assist Level: Moderate Assistance - Patient 50 - 74%     Upper Body Dressing/Undressing Upper body dressing   What is the patient wearing?:  (Night-gown)    Upper body assist Assist Level: Supervision/Verbal cueing    Lower Body Dressing/Undressing Lower  body dressing    Lower body dressing activity did not occur: Environmental limitations (no clothes available)       Lower body assist       Toileting Toileting    Toileting assist Assist for toileting: Dependent - Patient 0%     Transfers Chair/bed transfer  Transfers assist     Chair/bed transfer assist level: Contact Guard/Touching assist     Locomotion Ambulation   Ambulation assist   Ambulation activity did not occur: Safety/medical concerns  Assist level: Contact Guard/Touching assist Assistive device: Walker-rolling Max distance: 37ft   Walk 10 feet activity   Assist  Walk 10 feet activity did not  occur: Safety/medical concerns  Assist level: Contact Guard/Touching assist Assistive device: Walker-rolling   Walk 50 feet activity   Assist Walk 50 feet with 2 turns activity did not occur: Safety/medical concerns         Walk 150 feet activity   Assist Walk 150 feet activity did not occur: Safety/medical concerns         Walk 10 feet on uneven surface  activity   Assist Walk 10 feet on uneven surfaces activity did not occur: Safety/medical concerns         Wheelchair     Assist Is the patient using a wheelchair?: No             Wheelchair 50 feet with 2 turns activity    Assist            Wheelchair 150 feet activity     Assist          Blood pressure 109/77, pulse 79, temperature 97.7 F (36.5 C), resp. rate 18, height 5\' 9"  (1.753 m), weight (!) 147.8 kg, last menstrual period 06/27/2023, SpO2 100%.  Medical Problem List and Plan: 1. 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 If unaccessed?             -ELOS/Goals: 2-4 weeks -  min A hopefully - 08/11/23             -Continue CIR   - 4/29: Having help from mom/dad at home in discharge. Mod-Max A ADLs with bed-level LB. Biggest barriers are weakness and some self-limiting behaviors. Walked 15 feet with EVA walker. Regular walker STS today.   2.  Antithrombotics: -DVT/anticoagulation:  Pharmaceutical: Lovenox  70mg  daily             -antiplatelet therapy: N/A 3. Pain Management: Has been using oxycodone  prn past 3 days? For foot, leg and back pain- also has Gabapentin  800mg  TID (increased 4/25) - duloxetine  stopped initially due to myositis- 30 mg daily resumed 4/23 -07/23/23 feet very sensitive, but cymbalta  and gabapentin  recently adjusted, lidocaine  cream not enough so  asked that pharmacy send 15g tubes per treatment, to adequately cover the plantar surfaces of her feet to see if we can get relief.  4-28: Remains very sensitive, does feel some improvement 4-29: Patient with some daytime lethargy, pain primarily at night.  Reduce daytime gabapentin  to 600 mg every morning/600 mg q. afternoon, increase nighttime dose to 1000 mg.  Increase duloxetine  to 30 mg twice daily. 4-30: Patient did much better overnight with the above changes. 5-1: Discussed with patient further weaning daytime  gabapentin ; she feels good on current regimen and wishes to hold off at this time  4. Mood/Behavior/Sleep: LCSW to follow for evaluation and support.              -antipsychotic agents: N/A  -Neuropsych consult  -07/23/23 didn't sleep well, has melatonin PRN to use  - 4-28: Sleep interrupted by pain as above.  Increasing gabapentin  and duloxetine ; may benefit from transition to Elavil nightly If no improvement  4-30: Sleeping better.  Thinks melatonin as needed is over sedating, advised that this is per her request.  5. Neuropsych/cognition: This patient is capable of making decisions on her own behalf.  - 4-28: Dr. Cheryll Corti evaluated today; appreciate his professional assessment  6. Skin/Wound Care: Routine pressure relief measures.   - Chest port appears clean, routine dressing changes  7. Fluids/Electrolytes/Nutrition: Monitor I/O. Continue Ensure supplements and vitamin supplementation -07/22/23 hypoK+ 3.4, KCL 40meq po x 1- recheck 4/28 -07/23/23 added labs for tomorrow (Mg/phos, CMP, CK) 4/28: Hypokalemia worsened to 2.9 status post 40 mill equivalents; add 40 mill equivalents KCl PO, +40 mill equivalents IV today.  Repeat BMP tonight and tomorrow a.m. Phos elevated, so we will avoid K-Phos--repeat potassium 3.4 at 6 PM. 4-29: Hypokalemia resolved.  Repeat labs Thursday. 5/1: Potassium back to 2.9.  DC HCTZ.  Add 40 mill equivalents daily p.o. repletion, another 20 mill  equivalents IV today.  Repeat labs in AM. 5-2: AM labs pending.  8. Non-specific myositis: Cymbalta  d/c by neurology due to concerns of rhabdomyolysis --Cymbalta  30 mg resumed 04/23 given improving myositis.  --Monitor CK intermittently improved from 1536-->514--> 398 --consider muscle biopsy if symptoms do not improve -07/23/23 CK to be done tomorrow and weekly on Mon 4-28: CK downtrending, continue to trend 5/1: CK has normalized.  9. Multifocal PNA: Likely aspiration--most significant in lingula and RLL on CT w/o contrast chest 07/18/23 --Hx of chronic intermittent N/V. May need swallow evaluation. Swallow precautions added.   --started on Augmentin  04/22-->04/25 for 5 day course.  -No apparent signs of respiratory distress  11. Multiple Vitamin deficiencies: Vitamin B1  250 mg daily 4/22-->4/28 followed by 100 mg daily IV.  --now on B12 IM+PO, IV folate. Vitamin A  10,000 units daily. Vitamin D  50,000/wk, and MVI             --recheck Mg, Phos (supplemented)-- ordered for Monday 4/28--Phos improving, magnesium  stable  12. Resting tachycardia: HR has been 110-120 range at baseline --not on any BB -Start metoprolol  12.5 mg BID -07/23/23 still tachycardic but less, monitor  - intermittent, asymptomatic. monitor  13. Accelerated HTN/orthostatic hypotension: BP poorly controlled. Continue hydrochlorothiazide  12.5mg  daily and Avapro  150mg  daily -- addition of BB? -4/25 BB started as in #12 -07/23/23 BPs better, monitor; of note, pharmacy note states pt was on amlodipine  previously? Remain off for now, but note this for d/c 4/28 : reduce HCTZ to 6.125 due to recurrent hypokalemia--BP stable 5/1: BUN elevated, blood pressure soft, hypokalemia as above; DC hydrochlorothiazide .  Got 500 cc IV fluid bolus this afternoon. 5-2: Remains low, irbesartan  reduced to 75 mg this AM; DC given ongoing symptomatic orthostasis, can consider resumption once BP comes up.  Will give 1 L IV fluids today and  patient thinks she can tolerate TED hose now, so we will add this on.  Will get orthostatics after IV fluid. 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 Latanoprost  and alphagan  gtts             --IVIG 04/21- 04/24. Followed by Dr. Curley Double. -07/23/23 humira, retinA, and rhopressa  listed in pharmacy note as "resume as needed in CIR or at discharge"; clarify when/if these need to be restarted while here 4-29: Should be OK to resume Retin A and Rhopressa ; will inquire about bringing from home. -- will check with Optho about Cellcept and Humira resumption.  4-30: Family brought Rhopressa  and Humira in from home; pharmacy holding Humira until clarified by Optho.  Rhopressa  resumed. 5-1: Spoke with Dr. Mason Sole, continue to hold Humira and CellCept pending outpatient follow-up with him shortly after discharge.  15. Urinary retention: Purewick being used--monitor voiding with PVR/bladder scan. Toilet every 4 hours  -4/25 PVR 64, continue to monitor   - 4-28: No recent PVRs documented, patient denies incontinence, monitor  16. Chronic Asthma: Continue Dulera  BID and singulair  10mg  nightly, azelastin BID --Followed by Dr. Idolina Maker.   17. H/o chronic gastritis w/nausea and vomiting: D/c Ibuprofen . Continue Protonix  40 mg daily and Pepcid  40mg  nightly; was on reglan  at home, has PRN compazine  here. Carafate  1g BID.  -- Followed by [facility].  No complaints while at rehab  18. Abnormal LFTs: Normal liver parenchyma and GB sludge noted on abdominal ultrasound 07/16/23.  --Resolving. Recheck in am-pending - 07/23/23 LFTs a bit improved 4/26,  repeat CMP tomorrow morning; of note, HepB SAb reactive, core Ab positive 4-28: LFTs looking better  19. Peripheral polyneuropathy: Cymbalta  resumed 04/23 as CK trending down. Will check weekly -- See #3 above; may benefit from transition to Lyrica or trial of Qutenza as outpatient.  Was establishing with Dr. Rayleen Cal  18. Anemia of chronic disease: Likely malabsorption w/ Low iron but elevated TIBC/Ferritin -07/23/23 Hgb 9.9 on Fri, repeat weekly on Mon/Thurs; iron studies done 4/21 showing low TIBC 172, iron 46, ferritin elevated 791.   19. Morbid obesity: BMI 48. Followed by RD/Bariatric nutrition for wt loss. Plan for Bariatric surgery 08/2023.              --Now on aggressive vitamin supplementation as above  20. Hyponatremia: Na down to 132-->question due to IVIG 4/31-4/24.  --May see rise in LFTs again.  -4/25 recheck labs still pending, will ask nursing to call to check on this -07/23/23 Na 131 on 4/26, recheck tomorrow to see trend or need for supplementation  4/28: NA stable 132. 5/1: Na 134, stable  21. Severe tricuspid regurgitation/grade 1 diastolic heart failure   - 4/29: add daily weights with diuretic adjustments--stable  -5/2: Weights remain stable, no external edema with DC hydrochlorothiazide .  Continue to monitor closely. Filed Weights   07/21/23 0559 07/27/23 0500 07/28/23 0510  Weight: (!) 149 kg (!) 149 kg (!) 147.8 kg    22. Gout: continue allopurinol  100mg  QD; off colchicine  for now, resume as appropriate   23. Diarrhea/constipation: -07/23/23 pt now having runny stools, thinks it's the food; stop miralax  daily and change to PRN; monitor 4-28: Ongoing liquid stool; encourage p.o. fluids, replete electrolytes as above, and adding fibercon supplemen\t   - 4/29: No Bms today; slowing down. Montior  - 4-30: Diarrhea followed by smears, KUB performed to  ensure no overflow incontinence, was normal.  5/1: KUB normal, patient feels she is having adequate bowel  movements.   Last bowel movement 5-2, adequate per patient  LOS: 8 days A FACE TO FACE EVALUATION WAS PERFORMED  Bea Lime 07/28/2023, 9:43 AM

## 2023-07-28 NOTE — Plan of Care (Signed)
  Problem: Consults Goal: RH GENERAL PATIENT EDUCATION Description: See Patient Education module for education specifics. Outcome: Progressing   Problem: RH BOWEL ELIMINATION Goal: RH STG MANAGE BOWEL WITH ASSISTANCE Description: STG Manage Bowel with supervision Assistance. Outcome: Progressing   Problem: RH BLADDER ELIMINATION Goal: RH STG MANAGE BLADDER WITH ASSISTANCE Description: STG Manage Bladder With supervision Assistance Outcome: Progressing   Problem: RH SKIN INTEGRITY Goal: RH STG SKIN FREE OF INFECTION/BREAKDOWN Description: Manage skin free of infection/breakdown with supervision Outcome: Progressing

## 2023-07-28 NOTE — Progress Notes (Signed)
 Guided Fall With PT , pt passed out for a couple seconds while getting up from cammode after having a BM , resulting in a guided fall with  PT, the patient states no pain, no to hitting head, and vitals were within range  07/28/23 1048  What Happened  Was fall witnessed? Yes  Who witnessed fall? PT Rosita  Patients activity before fall bathroom-assisted;during therapy  Point of contact buttocks  Was patient injured? No (guided fall)  Provider Notification  Provider Name/Title Cherri Corns, DO  Date Provider Notified 07/28/23  Time Provider Notified 1059  Method of Notification Face-to-face  Notification Reason Fall  Provider response See new orders  Date of Provider Response 07/28/23  Time of Provider Response 1121  Follow Up  Family notified Yes - comment  Time family notified 1218  Additional tests No  Adult Fall Risk Assessment  Risk Factor Category (scoring not indicated) High fall risk per protocol (document High fall risk)  Age 28  Fall History: Fall within 6 months prior to admission 5  Elimination; Bowel and/or Urine Incontinence 2  Elimination; Bowel and/or Urine Urgency/Frequency 2  Medications: includes PCA/Opiates, Anti-convulsants, Anti-hypertensives, Diuretics, Hypnotics, Laxatives, Sedatives, and Psychotropics 3  Patient Care Equipment 1  Mobility-Assistance 2  Mobility-Gait 2  Mobility-Sensory Deficit 2  Altered awareness of immediate physical environment 0  Impulsiveness 0  Lack of understanding of one's physical/cognitive limitations 0  Total Score 19  Patient Fall Risk Level High fall risk  Adult Fall Risk Interventions  Required Bundle Interventions *See Row Information* High fall risk - low, moderate, and high requirements implemented  Additional Interventions Use of appropriate toileting equipment (bedpan, BSC, etc.)  Vitals  Temp 98 F (36.7 C)  Temp Source Oral  BP (!) 86/58  BP Location Left Arm  BP Method Automatic  Patient Position (if  appropriate) Lying  Pulse Rate 98  Pulse Rate Source Dinamap;Apical  ECG Heart Rate 98  Oxygen Therapy  SpO2 100 %  O2 Device Room Air  Pain Assessment  Pain Scale 0-10  Pain Score 0  Neurological  Neuro (WDL) X  Level of Consciousness Alert  Orientation Level Oriented X4  Cognition Follows commands;No memory impairment  Speech Clear  R Hand Grip Moderate  L Hand Grip Moderate  R Foot Dorsiflexion Weak  L Foot Dorsiflexion Weak  R Foot Plantar Flexion Weak  L Foot Plantar Flexion Weak  RUE Motor Response Purposeful movement  RUE Sensation Full sensation  RUE Motor Strength 4  LUE Motor Response Purposeful movement  LUE Sensation Full sensation  LUE Motor Strength 4  RLE Motor Response Purposeful movement  RLE Sensation Numbness;Pain  RLE Motor Strength 4  LLE Motor Response Purposeful movement  LLE Sensation Numbness;Pain  LLE Motor Strength 4  Neuro Symptoms None  Neuro symptoms relieved by Rest  Musculoskeletal  Musculoskeletal (WDL) X  Assistive Device MaxiMove  Generalized Weakness Yes  Weight Bearing Restrictions Per Provider Order No  Musculoskeletal Details  RUE Full movement;Weakness  LUE Full movement;Weakness  RLE Limited movement  LLE Limited movement  Integumentary  Integumentary (WDL) WDL  Skin Color Appropriate for ethnicity  Skin Condition Dry  Skin Integrity Intact  Skin Turgor Non-tenting

## 2023-07-28 NOTE — Progress Notes (Signed)
 Physical Therapy Session Note  Patient Details  Name: Tiffany Velasquez MRN: [id number] Date of Birth: [birth year]  Today's Date: 07/28/2023 PT Individual Time: 1000-1045 and 1320-1345 PT Individual Time Calculation (min): 45 min and 25 min  Short Term Goals: Week 1:  PT Short Term Goal 1 (Week 1): Pt will roll side to side w/ supervision. PT Short Term Goal 2 (Week 1): Pt will transfer sup to sit w/ min A consistently and safely. PT Short Term Goal 3 (Week 1): Pt will transfer sit to stand from elevated surface w/ mod A +1 consistently. PT Short Term Goal 4 (Week 1): PT to assess gait.  Skilled Therapeutic Interventions/Progress Updates: Pt presented in TIS agreeable to therapy. Pt denies pain at rest and states BP help well during earlier OT session. Pt requesting to use bathroom. Performed Sit to stand with modA and pt able to ambulate to bathroom with minA. Pt sat at toilet with continent BM and urinary void. Pt sat at toilet at extended period of time before  to ensure B&B empty. Pt did c/o abdominal pain while on toilet then expressed that she had been having on/off abdominal pain throughout morning. Pt ready to stand requiring modA x 2 to stand. While completing peri-care pt with ?syncopal episode requiring assisted descent to floor. Pt initially minimally responsive however becoming more alert. Pt requiring mechanical lift and +3  assistance to transfer back to bed. In bed pt able to complete rolls with intermittent minA to complete peri-care and nsg completing assessment. Noted BP in supine 86/58 (67) HR 93 in bed. Provided emotional support to pt regarding incident and pt left in bed with nsg present and needs met.   Tx2: Pt presented in bed with nsg present. Pt attempting to eat lunch there allowed time for pt to eat. Returned ~15 min later with tray cleared. Pt c/o increased pain in B feet and L knee. Per nsg will have x-ray on L knee. Discussed earlier assisted fall with pt understanding it  was an accident and not to place blame on self. Discussed potentially holding breath while attempting to power up which may have contributed to ?syncopal episode. While discussing with pt PTA placed hot pack on R knee for pain management with pt expressing some relief. X-ray arrived towards end of session with pt completing supine/sit/supine with supervision and use of bed features. BP vitals taken below for spot check. Pt left in bed at end of session with call bell within reach and needs met.   Supine 99/69 (79) HR 78 Sitting 98/74 (82) HR 90        Therapy Documentation Precautions:  Precautions Precautions: Fall Recall of Precautions/Restrictions: Intact Precaution/Restrictions Comments: 6 falls recently Restrictions Weight Bearing Restrictions Per Provider Order: No General: PT Amount of Missed Time (min): 35 Minutes PT Missed Treatment Reason: Pain Vital Signs: Therapy Vitals Temp: 98 F (36.7 C) Temp Source: Oral Pulse Rate: 88 Resp: 18 BP: 107/66 Patient Position (if appropriate): Lying Oxygen Therapy SpO2: 100 % O2 Device: Room Air Pain: Pain Assessment Pain Scale: 0-10 Pain Score: 7  Pain Location: Knee Pain Intervention(s): Pain med given for lower pain score than stated, per patient request   Therapy/Group: Individual Therapy  Kieon Lawhorn 07/28/2023, 4:44 PM

## 2023-07-29 MED ORDER — POTASSIUM CHLORIDE 10 MEQ/100ML IV SOLN
10.0000 meq | INTRAVENOUS | Status: AC
  Administered 2023-07-29 (×2): 10 meq via INTRAVENOUS
  Filled 2023-07-29 (×2): qty 100

## 2023-07-29 NOTE — Progress Notes (Signed)
 Physical Therapy Weekly Progress Note  Patient Details  Name: ADRIAN PRUSKI MRN: [id number] Date of Birth: 1995/07/19  Beginning of progress report period: July 21, 2023 End of progress report period: Jul 29, 2023  Today's Date: 07/31/2023 PT Individual Time: 1435-1530 PT Individual Time Calculation (min): 55 min   Patient has met 3 of 4 short term goals.  Pt has made good progress towards goals this week. Pt has been performing bed mobility with consistent supervision with use of bed features. She has been able to stand with as little as minA depending on height and fatigue levels but is more consistently modA 1-2 again depending on fatigue levels. Gait has been initiated with pt ambulating distances of 50-170' with minA. Pt tends to ambulate with decreased coordination due to debility. Pt has noted to have increasing soft BP over past few days with syncopal episode on 5/3 requiring assisted fall and will continue to minitor.   Patient continues to demonstrate the following deficits muscle weakness and muscle joint tightness, decreased cardiorespiratoy endurance, and unbalanced muscle activation and decreased coordination and therefore will continue to benefit from skilled PT intervention to increase functional independence with mobility.  Patient progressing toward long term goals..  Continue plan of care.  PT Short Term Goals Week 1:  PT Short Term Goal 1 (Week 1): Pt will roll side to side w/ supervision. PT Short Term Goal 1 - Progress (Week 1): Met PT Short Term Goal 2 (Week 1): Pt will transfer sup to sit w/ min A consistently and safely. PT Short Term Goal 2 - Progress (Week 1): Met PT Short Term Goal 3 (Week 1): Pt will transfer sit to stand from elevated surface w/ mod A +1 consistently. PT Short Term Goal 3 - Progress (Week 1): Partly met PT Short Term Goal 4 (Week 1): PT to assess gait. PT Short Term Goal 4 - Progress (Week 1): Met Week 2:  PT Short Term Goal 1 (Week 2): Pt will  ambulate with RW 100' with min assist PT Short Term Goal 2 (Week 2): Pt will completes sit<>stands with min assist consistently PT Short Term Goal 3 (Week 2): Pt will complete up/down 4 steps with BHRs min assist  Skilled Therapeutic Interventions/Progress Updates:  Ambulation/gait training;Community reintegration;Neuromuscular re-education;Stair training;UE/LE Strength taining/ROM;Wheelchair propulsion/positioning;Therapeutic Activities;UE/LE Coordination activities;Discharge planning;Functional mobility training;Therapeutic Exercise;Patient/family education;Balance/vestibular training;Disease management/prevention;Pain management;DME/adaptive equipment instruction;Cognitive remediation/compensation;Psychosocial support   Therapy Documentation Precautions:  Precautions Precautions: Fall Recall of Precautions/Restrictions: Intact Precaution/Restrictions Comments: 6 falls recently Restrictions Weight Bearing Restrictions Per Provider Order: No   Therapy/Group: Individual Therapy  Annia Kilts PT, DPT 07/31/2023, 4:29 PM

## 2023-07-29 NOTE — Plan of Care (Signed)
 Making progress on various methods to manage bowel and bladder and skin still free of infection and breakdown through cleaning and patient being able to self turn and knows when to ask for assistance.  Problem: Consults Goal: RH GENERAL PATIENT EDUCATION Description: See Patient Education module for education specifics. Outcome: Progressing   Problem: RH BOWEL ELIMINATION Goal: RH STG MANAGE BOWEL WITH ASSISTANCE Description: STG Manage Bowel with supervision Assistance. Outcome: Progressing   Problem: RH BLADDER ELIMINATION Goal: RH STG MANAGE BLADDER WITH ASSISTANCE Description: STG Manage Bladder With supervision Assistance Outcome: Progressing   Problem: RH SKIN INTEGRITY Goal: RH STG SKIN FREE OF INFECTION/BREAKDOWN Description: Manage skin free of infection/breakdown with supervision Outcome: Progressing

## 2023-07-29 NOTE — Progress Notes (Signed)
 PROGRESS NOTE   Subjective/Complaints:  Pt doing well, slept ok, pain overall well managed, LBM yesterday, urinating ok but "burns a little" and sometimes difficulty with urination. Thinks she has a UTI. Denies discharge or other symptoms. No other complaints or concerns.   ROS: as per HPI. Denies CP, SOB, abd pain, N/V/D/C, or any other complaints at this time.   + burning pain both feet--improved + Insomnia, improved + Orthostatic hypotension, ongoing   Objective:   DG Knee 1-2 Views Left Result Date: 07/28/2023 CLINICAL DATA:  Knee pain EXAM: LEFT KNEE - 2 VIEW COMPARISON:  None Available. FINDINGS: No evidence of fracture, dislocation, or joint effusion. No evidence of arthropathy or other focal bone abnormality. Soft tissues are unremarkable. IMPRESSION: No acute osseous abnormality. Electronically Signed   By: Adrianna Horde M.D.   On: 07/28/2023 14:25    Recent Labs    07/27/23 0320  WBC 4.9  HGB 9.6*  HCT 30.0*  PLT 395   Recent Labs    07/27/23 0320 07/28/23 2148  NA 134* 131*  K 2.9* 3.1*  CL 106 108  CO2 13* 16*  GLUCOSE 102* 110*  BUN 27* 23*  CREATININE 0.56 0.56  CALCIUM  9.3 9.0    Intake/Output Summary (Last 24 hours) at 07/29/2023 0819 Last data filed at 07/28/2023 2111 Gross per 24 hour  Intake 1302.57 ml  Output --  Net 1302.57 ml        Physical Exam: Vital Signs Blood pressure 110/82, pulse 89, temperature (!) 97.5 F (36.4 C), resp. rate 17, height 5\' 9"  (1.753 m), weight (!) 148.3 kg, last menstrual period 06/27/2023, SpO2 100%.    General: NAD, laying in bed.  Obese.  HEENT: Head is normocephalic, atraumatic, MMM Neck: Supple without JVD or lymphadenopathy Heart: Regular rate and rhythm, no m/r/g appreciated Chest: CTA bilaterally without wheezes, rales, or rhonchi; no distress. Port R chest  Abdomen: Soft, non-tender, non-distended, bowel sounds positive. Extremities: No clubbing,  cyanosis, Tr b/l edema.  Psych: fairly flat affect today Skin: Clean and intact without signs of breakdown over exposed surfaces.  Right chest port C-D-I  PRIOR EXAMS: Neuro:  Awake, alert, oriented x 4.  No apparent cognitive deficits. Good insight into current situation. Strength antigravity 4- out of 5 bilateral upper extremities, 4-/5 bilateral hip flexors, 4 out of 5 distal lower extremities. Hypersensitivity to light touch in bilateral feet/ankles.  Otherwise, sensation intact No apparent tone, no abnormal movements or tremors   Assessment/Plan: 1. Functional deficits which require 3+ hours per day of interdisciplinary therapy in a comprehensive inpatient rehab setting. Physiatrist is providing close team supervision and 24 hour management of active medical problems listed below. Physiatrist and rehab team continue to assess barriers to discharge/monitor patient progress toward functional and medical goals  Care Tool:  Bathing    Body parts bathed by patient: Right arm, Left arm, Chest, Abdomen, Front perineal area, Face   Body parts bathed by helper: Buttocks, Right upper leg, Left upper leg, Right lower leg, Left lower leg     Bathing assist Assist Level: Moderate Assistance - Patient 50 - 74%     Upper Body Dressing/Undressing Upper body dressing  What is the patient wearing?:  (Night-gown)    Upper body assist Assist Level: Supervision/Verbal cueing    Lower Body Dressing/Undressing Lower body dressing    Lower body dressing activity did not occur: Environmental limitations (no clothes available)       Lower body assist       Toileting Toileting    Toileting assist Assist for toileting: Dependent - Patient 0%     Transfers Chair/bed transfer  Transfers assist     Chair/bed transfer assist level: Contact Guard/Touching assist     Locomotion Ambulation   Ambulation assist   Ambulation activity did not occur: Safety/medical concerns  Assist  level: Contact Guard/Touching assist Assistive device: Walker-rolling Max distance: 17ft   Walk 10 feet activity   Assist  Walk 10 feet activity did not occur: Safety/medical concerns  Assist level: Contact Guard/Touching assist Assistive device: Walker-rolling   Walk 50 feet activity   Assist Walk 50 feet with 2 turns activity did not occur: Safety/medical concerns         Walk 150 feet activity   Assist Walk 150 feet activity did not occur: Safety/medical concerns         Walk 10 feet on uneven surface  activity   Assist Walk 10 feet on uneven surfaces activity did not occur: Safety/medical concerns         Wheelchair     Assist Is the patient using a wheelchair?: No             Wheelchair 50 feet with 2 turns activity    Assist            Wheelchair 150 feet activity     Assist          Blood pressure 110/82, pulse 89, temperature (!) 97.5 F (36.4 C), resp. rate 17, height 5\' 9"  (1.753 m), weight (!) 148.3 kg, last menstrual period 06/27/2023, SpO2 100%.  Medical Problem List and Plan: 1. 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 If unaccessed?             -ELOS/Goals: 2-4 weeks -  min A hopefully - 08/11/23             -Continue CIR  - 4/29: Having help from mom/dad at home in discharge. Mod-Max A ADLs with bed-level LB. Biggest barriers are weakness and some self-limiting behaviors. Walked 15 feet with EVA walker. Regular walker STS today.   2.  Antithrombotics: -DVT/anticoagulation:  Pharmaceutical: Lovenox  70mg  daily             -antiplatelet therapy: N/A 3. Pain Management: Has been using oxycodone  prn past 3 days? For foot, leg and back pain- also has Gabapentin  800mg  TID (increased 4/25) - duloxetine  stopped initially  due to myositis- 30 mg daily resumed 4/23 -07/23/23 feet very sensitive, but cymbalta  and gabapentin  recently adjusted, lidocaine  cream not enough so asked that pharmacy send 15g tubes per treatment, to adequately cover the plantar surfaces of her feet to see if we can get relief.  4-28: Remains very sensitive, does feel some improvement 4-29: Patient with some daytime lethargy, pain primarily at night.  Reduce daytime gabapentin  to 600 mg every morning/600 mg q. afternoon, increase nighttime dose to 1000 mg.  Increase duloxetine  to 30 mg twice daily. 4-30: Patient did much better  overnight with the above changes. 5-1: Discussed with patient further weaning daytime gabapentin ; she feels good on current regimen and wishes to hold off at this time  4. Mood/Behavior/Sleep: LCSW to follow for evaluation and support.              -antipsychotic agents: N/A  -Neuropsych consult  -07/23/23 didn't sleep well, has melatonin PRN to use - 4-28: Sleep interrupted by pain as above.  Increasing gabapentin  and duloxetine ; may benefit from transition to Elavil nightly If no improvement 4-30: Sleeping better.  Thinks melatonin as needed is over sedating, advised that this is per her request.  5. Neuropsych/cognition: This patient is capable of making decisions on her own behalf. - 4-28: Dr. Cheryll Corti evaluated today; appreciate his professional assessment  6. Skin/Wound Care: Routine pressure relief measures.   - Chest port appears clean, routine dressing changes  7. Fluids/Electrolytes/Nutrition: Monitor I/O. Continue Ensure supplements and vitamin supplementation -07/22/23 hypoK+ 3.4, KCL 40meq po x 1- recheck 4/28 -07/23/23 added labs for tomorrow (Mg/phos, CMP, CK) 4/28: Hypokalemia worsened to 2.9 status post 40 mill equivalents; add 40 mill equivalents KCl PO, +40 mill equivalents IV today.  Repeat BMP tonight and tomorrow a.m. Phos elevated, so we will avoid K-Phos--repeat potassium 3.4 at 6 PM. 4-29:  Hypokalemia resolved.  Repeat labs Thursday. 5/1: Potassium back to 2.9.  DC HCTZ.  Add 40 mill equivalents daily p.o. repletion, another 20 mill equivalents IV today.  Repeat labs in AM. 07/29/23 K 3.1 yesterday, will do 20mEq IV once today and continue the 40mEq PO, repeat labs Monday  8. Non-specific myositis: Cymbalta  d/c by neurology due to concerns of rhabdomyolysis --Cymbalta  30 mg resumed 04/23 given improving myositis.  --Monitor CK intermittently improved from 1536-->514--> 398 --consider muscle biopsy if symptoms do not improve -07/23/23 CK to be done tomorrow and weekly on Mon 4-28: CK downtrending, continue to trend 5/1: CK has normalized.  9. Multifocal PNA: Likely aspiration--most significant in lingula and RLL on CT w/o contrast chest 07/18/23 --Hx of chronic intermittent N/V. May need swallow evaluation. Swallow precautions added.   --started on Augmentin  04/22-->04/25 for 5 day course.  -No apparent signs of respiratory distress  11. Multiple Vitamin deficiencies: Vitamin B1  250 mg daily 4/22-->4/28 followed by 100 mg daily IV.  --now on B12 IM+PO, IV folate. Vitamin A  10,000 units daily. Vitamin D  50,000/wk, and MVI --recheck Mg, Phos (supplemented)-- ordered for Monday 4/28--Phos improving, magnesium  stable  12. Resting tachycardia: HR has been 110-120 range at baseline --not on any BB -Start metoprolol  12.5 mg BID -07/23/23 still tachycardic but less, monitor  - intermittent, asymptomatic. monitor  13. Accelerated HTN/orthostatic hypotension: BP poorly controlled. Continue hydrochlorothiazide  12.5mg  daily and Avapro  150mg  daily -- addition of BB? -4/25 BB started as in #12 -07/23/23 BPs better, monitor; of note, pharmacy note states pt was on amlodipine  previously? Remain off for now, but note this for d/c 4/28 : reduce HCTZ to 6.125 due to recurrent hypokalemia--BP stable 5/1: BUN elevated, blood pressure soft, hypokalemia as above; DC hydrochlorothiazide .  Got 500  cc IV fluid bolus this afternoon. 5-2: Remains low, irbesartan  reduced to 75 mg this AM; DC given ongoing symptomatic orthostasis, can consider resumption once BP comes up.  Will give 1 L IV fluids today and patient thinks she can tolerate TED hose now, so we will add this on.  Will get orthostatics after IV fluid. 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 Latanoprost  and alphagan  gtts             --IVIG 04/21- 04/24. Followed by Dr. Curley Double. -07/23/23 humira, retinA, and rhopressa  listed in pharmacy note as "resume as needed in CIR or at discharge"; clarify when/if these need to be restarted while here 4-29: Should be OK to resume Retin A and Rhopressa ; will inquire about bringing from home. -- will check with Optho about Cellcept and Humira resumption.  4-30: Family brought Rhopressa  and Humira in from home; pharmacy holding Humira until clarified by Optho.  Rhopressa  resumed. 5-1: Spoke with Dr. Mason Sole, continue to hold Humira and CellCept pending outpatient follow-up with him shortly after discharge.  15. Urinary retention: Purewick being used--monitor voiding with PVR/bladder scan. Toilet every 4 hours  -4/25 PVR 64, continue to monitor   - 4-28: No recent PVRs documented, patient denies incontinence, monitor  16. Chronic Asthma: Continue Dulera  BID and singulair  10mg  nightly, azelastin BID --Followed by Dr. Idolina Maker.   17. H/o chronic gastritis w/nausea and vomiting: D/c Ibuprofen . Continue Protonix  40 mg daily and Pepcid  40mg  nightly; was on reglan  at home, has PRN compazine  here. Carafate  1g  BID.  -- Followed by [facility].  No complaints while at rehab  18. Abnormal LFTs: Normal liver parenchyma and GB sludge noted on abdominal ultrasound 07/16/23.  --Resolving. Recheck in am-pending - 07/23/23 LFTs a bit improved 4/26, repeat CMP tomorrow morning; of note, HepB SAb reactive, core Ab positive 4-28: LFTs looking better  19. Peripheral polyneuropathy: Cymbalta  resumed 04/23 as CK trending down. Will check weekly -- See #3 above; may benefit from transition to Lyrica or trial of Qutenza as outpatient.  Was establishing with Dr. Rayleen Cal  18. Anemia of chronic disease: Likely malabsorption w/ Low iron but elevated TIBC/Ferritin -07/23/23 Hgb 9.9 on Fri, repeat weekly on Mon/Thurs; iron studies done 4/21 showing low TIBC 172, iron 46, ferritin elevated 791.  -07/29/23 Hgb stable yesterday; monitor  19. Morbid obesity: BMI 48. Followed by RD/Bariatric nutrition for wt loss. Plan for Bariatric surgery 08/2023.              --Now on aggressive vitamin supplementation as above  20. Hyponatremia: Na down to 132-->question due to IVIG 4/31-4/24.  --May see rise in LFTs again.  -4/25 recheck labs still pending, will ask nursing to call to check on this -07/23/23 Na 131 on 4/26, recheck tomorrow to see trend or need for supplementation  4/28: NA stable 132. 5/1: Na 134, stable>> 3.1 5/2; monitor  21. Severe tricuspid regurgitation/grade 1 diastolic heart failure   - 4/29: add daily weights with diuretic adjustments--stable -5/2: Weights remain stable, no external edema with DC hydrochlorothiazide .  Continue to monitor closely. Filed Weights   07/27/23 0500 07/28/23 0510 07/29/23 0504  Weight: (!) 149 kg (!) 147.8 kg (!) 148.3 kg    22. Gout: continue allopurinol  100mg  QD; off colchicine  for now, resume as appropriate   23. Diarrhea/constipation: -07/23/23 pt now having runny stools, thinks it's the food; stop miralax  daily and change to PRN; monitor 4-28: Ongoing liquid stool;  encourage p.o. fluids, replete electrolytes as above, and adding  fibercon supplemen\t   - 4/29: No Bms today; slowing down. [place] - 4-30: Diarrhea followed by smears, KUB performed to ensure no overflow incontinence, was normal. 5/1: KUB normal, patient feels she is having adequate bowel movements.   Last bowel movement 5-2, adequate per patient 24. Dysuria:  -07/29/23 pt stating some dysuria/difficulty with urination; ordered U/A  LOS: 9 days A FACE TO FACE EVALUATION WAS PERFORMED  [address] 07/29/2023, 8:19 AM

## 2023-07-30 DIAGNOSIS — R300 Dysuria: Secondary | ICD-10-CM

## 2023-07-30 NOTE — Progress Notes (Signed)
 PROGRESS NOTE   Subjective/Complaints:  Pt doing well again, but slept poorly. Pain overall doing ok, LBM yesterday, still urinating ok but "burns a little", however never got a U/A sent yesterday because she kept having to have a BM everytime she went to the restroom-- nursing will try the female urinal today. No other complaints or concerns.   ROS: as per HPI. Denies CP, SOB, abd pain, N/V/D/C, or any other complaints at this time.   + burning pain both feet--improved + Insomnia, improved + Orthostatic hypotension, ongoing   Objective:   DG Knee 1-2 Views Left Result Date: 07/28/2023 CLINICAL DATA:  Knee pain EXAM: LEFT KNEE - 2 VIEW COMPARISON:  None Available. FINDINGS: No evidence of fracture, dislocation, or joint effusion. No evidence of arthropathy or other focal bone abnormality. Soft tissues are unremarkable. IMPRESSION: No acute osseous abnormality. Electronically Signed   By: Adrianna Horde M.D.   On: 07/28/2023 14:25    No results for input(s): "WBC", "HGB", "HCT", "PLT" in the last 72 hours.  Recent Labs    07/28/23 2148  NA 131*  K 3.1*  CL 108  CO2 16*  GLUCOSE 110*  BUN 23*  CREATININE 0.56  CALCIUM  9.0    Intake/Output Summary (Last 24 hours) at 07/30/2023 1017 Last data filed at 07/29/2023 1600 Gross per 24 hour  Intake 241.96 ml  Output --  Net 241.96 ml        Physical Exam: Vital Signs Blood pressure 114/73, pulse 86, temperature 98.1 F (36.7 C), resp. rate 17, height 5\' 9"  (1.753 m), weight (!) 140 kg, last menstrual period 06/27/2023, SpO2 99%.    General: NAD, sitting up in bed.  Obese.  HEENT: Head is normocephalic, atraumatic, MMM Neck: Supple without JVD or lymphadenopathy Heart: Regular rate and rhythm, no m/r/g appreciated Chest: CTA bilaterally without wheezes, rales, or rhonchi; no distress. Port R chest  Abdomen: Soft, non-tender, non-distended, bowel sounds  positive. Extremities: No clubbing, cyanosis, Tr b/l edema.  Psych: fairly flat affect today but brighter today Skin: Clean and intact without signs of breakdown over exposed surfaces.  Right chest port C-D-I  PRIOR EXAMS: Neuro:  Awake, alert, oriented x 4.  No apparent cognitive deficits. Good insight into current situation. Strength antigravity 4- out of 5 bilateral upper extremities, 4-/5 bilateral hip flexors, 4 out of 5 distal lower extremities. Hypersensitivity to light touch in bilateral feet/ankles.  Otherwise, sensation intact No apparent tone, no abnormal movements or tremors   Assessment/Plan: 1. Functional deficits which require 3+ hours per day of interdisciplinary therapy in a comprehensive inpatient rehab setting. Physiatrist is providing close team supervision and 24 hour management of active medical problems listed below. Physiatrist and rehab team continue to assess barriers to discharge/monitor patient progress toward functional and medical goals  Care Tool:  Bathing    Body parts bathed by patient: Right arm, Left arm, Chest, Abdomen, Front perineal area, Face   Body parts bathed by helper: Buttocks, Right upper leg, Left upper leg, Right lower leg, Left lower leg     Bathing assist Assist Level: Moderate Assistance - Patient 50 - 74%     Upper Body Dressing/Undressing Upper  body dressing   What is the patient wearing?:  (Night-gown)    Upper body assist Assist Level: Supervision/Verbal cueing    Lower Body Dressing/Undressing Lower body dressing    Lower body dressing activity did not occur: Environmental limitations (no clothes available)       Lower body assist       Toileting Toileting    Toileting assist Assist for toileting: Dependent - Patient 0%     Transfers Chair/bed transfer  Transfers assist     Chair/bed transfer assist level: Contact Guard/Touching assist     Locomotion Ambulation   Ambulation assist   Ambulation  activity did not occur: Safety/medical concerns  Assist level: Contact Guard/Touching assist Assistive device: Walker-rolling Max distance: 42ft   Walk 10 feet activity   Assist  Walk 10 feet activity did not occur: Safety/medical concerns  Assist level: Contact Guard/Touching assist Assistive device: Walker-rolling   Walk 50 feet activity   Assist Walk 50 feet with 2 turns activity did not occur: Safety/medical concerns         Walk 150 feet activity   Assist Walk 150 feet activity did not occur: Safety/medical concerns         Walk 10 feet on uneven surface  activity   Assist Walk 10 feet on uneven surfaces activity did not occur: Safety/medical concerns         Wheelchair     Assist Is the patient using a wheelchair?: No             Wheelchair 50 feet with 2 turns activity    Assist            Wheelchair 150 feet activity     Assist          Blood pressure 114/73, pulse 86, temperature 98.1 F (36.7 C), resp. rate 17, height 5\' 9"  (1.753 m), weight (!) 140 kg, last menstrual period 06/27/2023, SpO2 99%.  Medical Problem List and Plan: 1. 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 If unaccessed?             -ELOS/Goals: 2-4 weeks -  min A hopefully - 08/11/23             -Continue CIR  - 4/29: Having help from mom/dad at home in discharge. Mod-Max A ADLs with bed-level LB. Biggest barriers are weakness and some self-limiting behaviors. Walked 15 feet with EVA walker. Regular walker STS today.   2.  Antithrombotics: -DVT/anticoagulation:  Pharmaceutical: Lovenox  70mg  daily             -antiplatelet therapy: N/A 3. Pain Management: Has been using oxycodone  prn past 3 days? For foot, leg and back pain- also has Gabapentin  800mg   TID (increased 4/25) - duloxetine  stopped initially due to myositis- 30 mg daily resumed 4/23 -07/23/23 feet very sensitive, but cymbalta  and gabapentin  recently adjusted, lidocaine  cream not enough so asked that pharmacy send 15g tubes per treatment, to adequately cover the plantar surfaces of her feet to see if we can get relief.  4-28: Remains very sensitive, does feel some improvement 4-29: Patient with some daytime lethargy, pain primarily at night.  Reduce daytime gabapentin  to 600 mg every morning/600 mg q. afternoon, increase nighttime dose to 1000 mg.  Increase duloxetine  to 30 mg twice daily. 4-30: Patient  did much better overnight with the above changes. 5-1: Discussed with patient further weaning daytime gabapentin ; she feels good on current regimen and wishes to hold off at this time  4. Mood/Behavior/Sleep: LCSW to follow for evaluation and support.              -antipsychotic agents: N/A  -Neuropsych consult  -07/23/23 didn't sleep well, has melatonin PRN to use - 4-28: Sleep interrupted by pain as above.  Increasing gabapentin  and duloxetine ; may benefit from transition to Elavil nightly If no improvement 4-30: Sleeping better.  Thinks melatonin as needed is over sedating, advised that this is per her request.  5. Neuropsych/cognition: This patient is capable of making decisions on her own behalf. - 4-28: Dr. Cheryll Corti evaluated today; appreciate his professional assessment  6. Skin/Wound Care: Routine pressure relief measures.   - Chest port appears clean, routine dressing changes  7. Fluids/Electrolytes/Nutrition: Monitor I/O. Continue Ensure supplements and vitamin supplementation -07/22/23 hypoK+ 3.4, KCL 40meq po x 1- recheck 4/28 -07/23/23 added labs for tomorrow (Mg/phos, CMP, CK) 4/28: Hypokalemia worsened to 2.9 status post 40 mill equivalents; add 40 mill equivalents KCl PO, +40 mill equivalents IV today.  Repeat BMP tonight and tomorrow a.m. Phos elevated, so we will  avoid K-Phos--repeat potassium 3.4 at 6 PM. 4-29: Hypokalemia resolved.  Repeat labs Thursday. 5/1: Potassium back to 2.9.  DC HCTZ.  Add 40 mill equivalents daily p.o. repletion, another 20 mill equivalents IV today.  Repeat labs in AM. 07/29/23 K 3.1 yesterday, will do 20mEq IV once today and continue the 40mEq PO, repeat labs Monday  8. Non-specific myositis: Cymbalta  d/c by neurology due to concerns of rhabdomyolysis --Cymbalta  30 mg resumed 04/23 given improving myositis.  --Monitor CK intermittently improved from 1536-->514--> 398 --consider muscle biopsy if symptoms do not improve -07/23/23 CK to be done tomorrow and weekly on Mon 4-28: CK downtrending, continue to trend 5/1: CK has normalized.  9. Multifocal PNA: Likely aspiration--most significant in lingula and RLL on CT w/o contrast chest 07/18/23 --Hx of chronic intermittent N/V. May need swallow evaluation. Swallow precautions added.   --started on Augmentin  04/22-->04/25 for 5 day course.  -No apparent signs of respiratory distress  11. Multiple Vitamin deficiencies: Vitamin B1  250 mg daily 4/22-->4/28 followed by 100 mg daily IV.  --now on B12 IM+PO, IV folate. Vitamin A  10,000 units daily. Vitamin D  50,000/wk, and MVI --recheck Mg, Phos (supplemented)-- ordered for Monday 4/28--Phos improving, magnesium  stable  12. Resting tachycardia: HR has been 110-120 range at baseline --not on any BB -Start metoprolol  12.5 mg BID -07/23/23 still tachycardic but less, monitor  - intermittent, asymptomatic. monitor  13. Accelerated HTN/orthostatic hypotension: BP poorly controlled. Continue hydrochlorothiazide  12.5mg  daily and Avapro  150mg  daily -- addition of BB? -4/25 BB started as in #12 -07/23/23 BPs better, monitor; of note, pharmacy note states pt was on amlodipine  previously? Remain off for now, but note this for d/c 4/28 : reduce HCTZ to 6.125 due to recurrent hypokalemia--BP stable 5/1: BUN elevated, blood pressure soft,  hypokalemia as above; DC hydrochlorothiazide .  Got 500 cc IV fluid bolus this afternoon. 5-2: Remains low, irbesartan  reduced to 75 mg this AM; DC given ongoing symptomatic orthostasis, can consider resumption once BP comes up.  Will give 1 L IV fluids today and patient thinks she can tolerate TED hose now, so we will add this on.  Will get orthostatics after IV fluid. 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 Latanoprost  and alphagan  gtts             --IVIG 04/21- 04/24. Followed by Dr. Curley Double. -07/23/23 humira, retinA, and rhopressa  listed in pharmacy note as "resume as needed in CIR or at discharge"; clarify when/if these need to be restarted while here 4-29: Should be OK to resume Retin A and Rhopressa ; will inquire about bringing from home. -- will check with Optho about Cellcept and Humira resumption.  4-30: Family brought Rhopressa  and Humira in from home; pharmacy holding Humira until clarified by Optho.  Rhopressa  resumed. 5-1: Spoke with Dr. Mason Sole, continue to hold Humira and CellCept pending outpatient follow-up with him shortly after discharge.  15. Urinary retention: Purewick being used--monitor voiding with PVR/bladder scan. Toilet every 4 hours  -4/25 PVR 64, continue to monitor   - 4-28: No recent PVRs documented, patient denies incontinence, monitor  16. Chronic Asthma: Continue Dulera  BID and singulair  10mg  nightly, azelastin BID --Followed by Dr. Idolina Maker.   17. H/o chronic gastritis w/nausea and vomiting: D/c Ibuprofen . Continue Protonix  40 mg daily and Pepcid  40mg  nightly; was on  reglan  at home, has PRN compazine  here. Carafate  1g BID.  -- Followed by [facility].  No complaints while at rehab  18. Abnormal LFTs: Normal liver parenchyma and GB sludge noted on abdominal ultrasound 07/16/23.  --Resolving. Recheck in am-pending - 07/23/23 LFTs a bit improved 4/26, repeat CMP tomorrow morning; of note, HepB SAb reactive, core Ab positive 4-28: LFTs looking better  19. Peripheral polyneuropathy: Cymbalta  resumed 04/23 as CK trending down. Will check weekly -- See #3 above; may benefit from transition to Lyrica or trial of Qutenza as outpatient.  Was establishing with Dr. Rayleen Cal  18. Anemia of chronic disease: Likely malabsorption w/ Low iron but elevated TIBC/Ferritin -07/23/23 Hgb 9.9 on Fri, repeat weekly on Mon/Thurs; iron studies done 4/21 showing low TIBC 172, iron 46, ferritin elevated 791.  -07/29/23 Hgb stable yesterday; monitor  19. Morbid obesity: BMI 48. Followed by RD/Bariatric nutrition for wt loss. Plan for Bariatric surgery 08/2023.              --Now on aggressive vitamin supplementation as above  20. Hyponatremia: Na down to 132-->question due to IVIG 4/31-4/24.  --May see rise in LFTs again.  -4/25 recheck labs still pending, will ask nursing to call to check on this -07/23/23 Na 131 on 4/26, recheck tomorrow to see trend or need for supplementation  4/28: NA stable 132. 5/1: Na 134, stable>> 3.1 5/2; monitor  21. Severe tricuspid regurgitation/grade 1 diastolic heart failure   - 4/29: add daily weights with diuretic adjustments--stable -5/2: Weights remain stable, no external edema with DC hydrochlorothiazide .  Continue to monitor closely. -07/30/23 wt down significantly today, wonder if it's inaccurate; monitor trend Filed Weights   07/28/23 0510 07/29/23 0504 07/30/23 0500  Weight: (!) 147.8 kg (!) 148.3 kg (!) 140 kg    22. Gout: continue allopurinol  100mg  QD; off colchicine  for now, resume as appropriate   23. Diarrhea/constipation: -07/23/23  pt now having runny stools, thinks it's the food; stop miralax  daily and change to PRN; monitor 4-28:  Ongoing liquid stool; encourage p.o. fluids, replete electrolytes as above, and adding fibercon supplemen\t   - 4/29: No Bms today; slowing down. [place] - 4-30: Diarrhea followed by smears, KUB performed to ensure no overflow incontinence, was normal. 5/1: KUB normal, patient feels she is having adequate bowel movements.   Last bowel movement 5-2, adequate per patient 24. Dysuria:  -07/29/23 pt stating some dysuria/difficulty with urination; ordered U/A -07/30/23 U/A not done yesterday d/t BMs everytime she urinated; will try urinal today; f/up on results   LOS: 10 days A FACE TO FACE EVALUATION WAS PERFORMED  [address] 07/30/2023, 10:17 AM

## 2023-07-31 DIAGNOSIS — N390 Urinary tract infection, site not specified: Secondary | ICD-10-CM

## 2023-07-31 DIAGNOSIS — K5900 Constipation, unspecified: Secondary | ICD-10-CM

## 2023-07-31 LAB — CBC
HCT: 27.5 % — ABNORMAL LOW (ref 36.0–46.0)
Hemoglobin: 8.5 g/dL — ABNORMAL LOW (ref 12.0–15.0)
MCH: 29.1 pg (ref 26.0–34.0)
MCHC: 30.9 g/dL (ref 30.0–36.0)
MCV: 94.2 fL (ref 80.0–100.0)
Platelets: 296 10*3/uL (ref 150–400)
RBC: 2.92 MIL/uL — ABNORMAL LOW (ref 3.87–5.11)
RDW: 21.4 % — ABNORMAL HIGH (ref 11.5–15.5)
WBC: 2.9 10*3/uL — ABNORMAL LOW (ref 4.0–10.5)
nRBC: 0 % (ref 0.0–0.2)

## 2023-07-31 LAB — URINALYSIS, ROUTINE W REFLEX MICROSCOPIC
Glucose, UA: NEGATIVE mg/dL
Hgb urine dipstick: NEGATIVE
Ketones, ur: NEGATIVE mg/dL
Nitrite: NEGATIVE
Protein, ur: >=300 mg/dL — AB
Specific Gravity, Urine: 1.027 (ref 1.005–1.030)
pH: 8 (ref 5.0–8.0)

## 2023-07-31 LAB — BASIC METABOLIC PANEL WITH GFR
Anion gap: 5 (ref 5–15)
BUN: 16 mg/dL (ref 6–20)
CO2: 17 mmol/L — ABNORMAL LOW (ref 22–32)
Calcium: 9 mg/dL (ref 8.9–10.3)
Chloride: 114 mmol/L — ABNORMAL HIGH (ref 98–111)
Creatinine, Ser: 0.53 mg/dL (ref 0.44–1.00)
GFR, Estimated: >60 mL/min (ref >60)
Glucose, Bld: 87 mg/dL (ref 70–99)
Potassium: 3.3 mmol/L — ABNORMAL LOW (ref 3.5–5.1)
Sodium: 136 mmol/L (ref 135–145)

## 2023-07-31 LAB — CK: Total CK: 36 U/L — ABNORMAL LOW (ref 38–234)

## 2023-07-31 LAB — MAGNESIUM: Magnesium: 1.8 mg/dL (ref 1.7–2.4)

## 2023-07-31 MED ORDER — POTASSIUM CHLORIDE CRYS ER 20 MEQ PO TBCR
30.0000 meq | EXTENDED_RELEASE_TABLET | Freq: Two times a day (BID) | ORAL | Status: DC
  Administered 2023-07-31 – 2023-08-16 (×32): 30 meq via ORAL
  Filled 2023-07-31 (×33): qty 1

## 2023-07-31 MED ORDER — CEPHALEXIN 250 MG PO CAPS
250.0000 mg | ORAL_CAPSULE | Freq: Four times a day (QID) | ORAL | Status: DC
  Administered 2023-07-31 – 2023-08-04 (×16): 250 mg via ORAL
  Filled 2023-07-31 (×16): qty 1

## 2023-07-31 MED ORDER — METHOCARBAMOL 500 MG PO TABS
500.0000 mg | ORAL_TABLET | Freq: Three times a day (TID) | ORAL | Status: DC | PRN
Start: 2023-07-31 — End: 2023-08-16
  Administered 2023-08-01 – 2023-08-12 (×7): 500 mg via ORAL
  Filled 2023-07-31 (×8): qty 1

## 2023-07-31 NOTE — Progress Notes (Signed)
 Occupational Therapy Session Note  Patient Details  Name: Tiffany Velasquez MRN: [id number] Date of Birth: 05/16/1995  Today's Date: 07/31/2023 OT Individual Time: 4098-1191 OT Individual Time Calculation (min): 72 min    Short Term Goals: Week 2:  OT Short Term Goal 1 (Week 2): Pt will hike LB garments with Min A + LRAD. OT Short Term Goal 2 (Week 2): Pt will perform 1/3 toileting activities with Min A + LRAD. OT Short Term Goal 3 (Week 2): Pt will perform toilet transfer with consistent Min A + LRAD.  Skilled Therapeutic Interventions/Progress Updates: Patient received sitting up in w/c. Requested doing UE strength and endurance exercises outside. Patient assisted to out door patio for yellow thera band exercises working on strength and full ROM with light resistance. Patient with good participation performing exercises 2 x 10 in varied planes. Continued exercise inside in the therapy gym working in the parallel bars for standing tolerance and balance in preparation for meeting STG of managing own clothing in standing. Stood 3 x for 1-2 minutes each time. In standing worked on reaching with one UE at a time to shoulder height. Followed second stand with alternate LE movements and UE reaches. Patient with good participation and no complaints of pain in standing. AAROM dowel exercises to conclude treatment working on full shoulder flexion and abduction with assist. Patient wanting to get back in bed following treatment. Stand stepping transfer with CGA. Patient wanting to sit EOB. Left with call bell, phone and tray table in reach. Continue with skilled OT POC.     Therapy Documentation Precautions:  Precautions Precautions: Fall Recall of Precautions/Restrictions: Intact Precaution/Restrictions Comments: 6 falls recently Restrictions Weight Bearing Restrictions Per Provider Order: No General:   Vital Signs: Therapy Vitals Temp: 97.8 F (36.6 C) Temp Source: Oral Pulse Rate: 81 Resp:  16 BP: 114/80 Patient Position (if appropriate): Lying Oxygen Therapy SpO2: 100 % O2 Device: Room Air Pain:   ADL: ADL Eating: Minimal assistance Where Assessed-Eating: Bed level Grooming: Minimal assistance Where Assessed-Grooming: Sitting at sink Upper Body Bathing: Moderate assistance Where Assessed-Upper Body Bathing: Bed level Lower Body Bathing:  (total A) Where Assessed-Lower Body Bathing: Bed level Upper Body Dressing:  (hosptial gown with total A) Lower Body Dressing: Not assessed    Therapy/Group: Individual Therapy  Marty Sleet 07/31/2023, 12:27 PM

## 2023-07-31 NOTE — Progress Notes (Signed)
 Weight (standing) 308.6

## 2023-07-31 NOTE — Progress Notes (Signed)
 Per night shift she collected urine specimen per MD order.   Randeen Busman, LPN

## 2023-07-31 NOTE — Progress Notes (Signed)
 PROGRESS NOTE   Subjective/Complaints: Pt reports she feels stiff in her feet. She denies nerve pain currently in this area-says she can tell the difference.  Burning/shooting pain her feet has improved.     ROS: as per HPI. Denies CP, SOB, abd pain, N/V/D/C, or any other complaints at this time.   + burning pain both feet--improved + Insomnia, improved + Orthostatic hypotension, ongoing + dysuria  Objective:   No results found.   Recent Labs    07/31/23 0352  WBC 2.9*  HGB 8.5*  HCT 27.5*  PLT 296    Recent Labs    07/28/23 2148 07/31/23 0352  NA 131* 136  K 3.1* 3.3*  CL 108 114*  CO2 16* 17*  GLUCOSE 110* 87  BUN 23* 16  CREATININE 0.56 0.53  CALCIUM  9.0 9.0    Intake/Output Summary (Last 24 hours) at 07/31/2023 1508 Last data filed at 07/31/2023 0807 Gross per 24 hour  Intake 618 ml  Output --  Net 618 ml        Physical Exam: Vital Signs Blood pressure 109/78, pulse 90, temperature 97.9 F (36.6 C), temperature source Oral, resp. rate 18, height 5\' 9"  (1.753 m), weight (!) 140 kg, last menstrual period 06/27/2023, SpO2 100%.    General: NAD, sitting up in bed.  Obese.  HEENT: Head is normocephalic, atraumatic, MMM Neck: Supple without JVD or lymphadenopathy Heart: Regular rate and rhythm, no m/r/g appreciated Chest: CTA bilaterally without wheezes, rales, or rhonchi; no distress. Port R chest  Abdomen: Soft, non-tender, non-distended, bowel sounds positive. Extremities: No clubbing, cyanosis, Tr b/l edema.  Psych: fairly flat affect today but brighter today Skin: Clean and intact without signs of breakdown over exposed surfaces.  Right chest port C-D-I  PRIOR EXAMS: Neuro:  Awake, alert, oriented x 4.  No apparent cognitive deficits. Good insight into current situation. Strength antigravity 4- out of 5 bilateral upper extremities, 4-/5 bilateral hip flexors, 4 out of 5 distal lower  extremities. Hypersensitivity to light touch in bilateral feet/ankles.  Otherwise, sensation intact No apparent tone, no abnormal movements or tremors   Assessment/Plan: 1. Functional deficits which require 3+ hours per day of interdisciplinary therapy in a comprehensive inpatient rehab setting. Physiatrist is providing close team supervision and 24 hour management of active medical problems listed below. Physiatrist and rehab team continue to assess barriers to discharge/monitor patient progress toward functional and medical goals  Care Tool:  Bathing    Body parts bathed by patient: Right arm, Left arm, Chest, Abdomen, Front perineal area, Face   Body parts bathed by helper: Buttocks, Right upper leg, Left upper leg, Right lower leg, Left lower leg     Bathing assist Assist Level: Moderate Assistance - Patient 50 - 74%     Upper Body Dressing/Undressing Upper body dressing   What is the patient wearing?: Pull over shirt    Upper body assist Assist Level: Supervision/Verbal cueing    Lower Body Dressing/Undressing Lower body dressing    Lower body dressing activity did not occur: Environmental limitations (no clothes available) What is the patient wearing?: Pants     Lower body assist Assist for lower body dressing:  Maximal Assistance - Patient 25 - 49%     Toileting Toileting    Toileting assist Assist for toileting: Dependent - Patient 0%     Transfers Chair/bed transfer  Transfers assist     Chair/bed transfer assist level: Contact Guard/Touching assist     Locomotion Ambulation   Ambulation assist   Ambulation activity did not occur: Safety/medical concerns  Assist level: Contact Guard/Touching assist Assistive device: Walker-rolling Max distance: 18ft   Walk 10 feet activity   Assist  Walk 10 feet activity did not occur: Safety/medical concerns  Assist level: Contact Guard/Touching assist Assistive device: Walker-rolling   Walk 50 feet  activity   Assist Walk 50 feet with 2 turns activity did not occur: Safety/medical concerns         Walk 150 feet activity   Assist Walk 150 feet activity did not occur: Safety/medical concerns         Walk 10 feet on uneven surface  activity   Assist Walk 10 feet on uneven surfaces activity did not occur: Safety/medical concerns         Wheelchair     Assist Is the patient using a wheelchair?: No             Wheelchair 50 feet with 2 turns activity    Assist            Wheelchair 150 feet activity     Assist          Blood pressure 109/78, pulse 90, temperature 97.9 F (36.6 C), temperature source Oral, resp. rate 18, height 5\' 9"  (1.753 m), weight (!) 140 kg, last menstrual period 06/27/2023, SpO2 100%.  Medical Problem List and Plan: 1. 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 If unaccessed?             -ELOS/Goals: 2-4 weeks -  min A hopefully - 08/11/23             -Continue CIR  - 4/29: Having help from mom/dad at home in discharge. Mod-Max A ADLs with bed-level LB. Biggest barriers are weakness and some self-limiting behaviors. Walked 15 feet with EVA walker. Regular walker STS today.  -Team conference tomorrow  2.  Antithrombotics: -DVT/anticoagulation:  Pharmaceutical: Lovenox  70mg  daily             -antiplatelet therapy: N/A 3. Pain Management: Has been using oxycodone  prn past 3 days? For foot, leg and back pain- also has Gabapentin  800mg  TID (increased 4/25) - duloxetine  stopped initially due to myositis- 30 mg daily resumed 4/23 -07/23/23 feet very sensitive, but cymbalta  and gabapentin  recently adjusted, lidocaine  cream not enough so asked that pharmacy send 15g tubes per treatment, to adequately cover the plantar surfaces of  her feet to see if we can get relief.  4-28: Remains very sensitive, does feel some improvement 4-29: Patient with some daytime lethargy, pain primarily at night.  Reduce daytime gabapentin  to 600 mg every morning/600 mg q. afternoon, increase nighttime dose to 1000 mg.  Increase duloxetine  to 30 mg twice daily. 4-30: Patient did much better overnight with the above changes. 5-1: Discussed with patient further weaning daytime gabapentin ; she feels good on current regimen and wishes to hold off at this time 5/5 add prn robaxin for muscle spams  4. Mood/Behavior/Sleep: LCSW to follow  for evaluation and support.              -antipsychotic agents: N/A  -Neuropsych consult  -07/23/23 didn't sleep well, has melatonin PRN to use - 4-28: Sleep interrupted by pain as above.  Increasing gabapentin  and duloxetine ; may benefit from transition to Elavil nightly If no improvement 4-30: Sleeping better.  Thinks melatonin as needed is over sedating, advised that this is per her request.  5. Neuropsych/cognition: This patient is capable of making decisions on her own behalf. - 4-28: Dr. Cheryll Corti evaluated today; appreciate his professional assessment  6. Skin/Wound Care: Routine pressure relief measures.   - Chest port appears clean, routine dressing changes  7. Fluids/Electrolytes/Nutrition: Monitor I/O. Continue Ensure supplements and vitamin supplementation -07/22/23 hypoK+ 3.4, KCL 40meq po x 1- recheck 4/28 -07/23/23 added labs for tomorrow (Mg/phos, CMP, CK) 4/28: Hypokalemia worsened to 2.9 status post 40 mill equivalents; add 40 mill equivalents KCl PO, +40 mill equivalents IV today.  Repeat BMP tonight and tomorrow a.m. Phos elevated, so we will avoid K-Phos--repeat potassium 3.4 at 6 PM. 4-29: Hypokalemia resolved.  Repeat labs Thursday. 5/1: Potassium back to 2.9.  DC HCTZ.  Add 40 mill equivalents daily p.o. repletion, another 20 mill equivalents IV today.  Repeat labs in AM. 07/29/23 K 3.1  yesterday, will do 20mEq IV once today and continue the 40mEq PO, repeat labs Monday 5/5 K+ up to 3.3, increase to 30meq BID  8. Non-specific myositis: Cymbalta  d/c by neurology due to concerns of rhabdomyolysis --Cymbalta  30 mg resumed 04/23 given improving myositis.  --Monitor CK intermittently improved from 1536-->514--> 398 --consider muscle biopsy if symptoms do not improve -07/23/23 CK to be done tomorrow and weekly on Mon 4-28: CK downtrending, continue to trend 5/1: CK has normalized.  9. Multifocal PNA: Likely aspiration--most significant in lingula and RLL on CT w/o contrast chest 07/18/23 --Hx of chronic intermittent N/V. May need swallow evaluation. Swallow precautions added.   --started on Augmentin  04/22-->04/25 for 5 day course.  -No apparent signs of respiratory distress  11. Multiple Vitamin deficiencies: Vitamin B1  250 mg daily 4/22-->4/28 followed by 100 mg daily IV.  --now on B12 IM+PO, IV folate. Vitamin A  10,000 units daily. Vitamin D  50,000/wk, and MVI --recheck Mg, Phos (supplemented)-- ordered for Monday 4/28--Phos improving, magnesium  stable  12. Resting tachycardia: HR has been 110-120 range at baseline --not on any BB -Start metoprolol  12.5 mg BID -07/23/23 still tachycardic but less, monitor  - intermittent, asymptomatic. Monitor     07/31/2023    1:10 PM 07/31/2023    9:00 AM 07/31/2023    5:55 AM  Vitals with BMI  Weight  308 lbs 10 oz   BMI  45.55   Systolic 109  114  Diastolic 78  80  Pulse 90  81     13. Accelerated HTN/orthostatic hypotension: BP poorly controlled. Continue hydrochlorothiazide  12.5mg  daily and Avapro  150mg  daily -- addition of BB? -4/25 BB started as in #12 -07/23/23 BPs better, monitor; of note, pharmacy note states pt was on amlodipine  previously? Remain off for now, but note this for d/c 4/28 : reduce HCTZ to 6.125 due to recurrent hypokalemia--BP stable 5/1: BUN elevated, blood pressure soft, hypokalemia as above; DC  hydrochlorothiazide .  Got 500 cc IV fluid bolus this afternoon. 5-2: Remains low, irbesartan  reduced to 75 mg this AM; DC given ongoing symptomatic orthostasis, can consider resumption once BP comes up.  Will give 1 L IV fluids today and patient thinks she can  tolerate TED hose now, so we will add this on.  Will get orthostatics after IV fluid. 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 Latanoprost  and alphagan  gtts             --IVIG 04/21- 04/24. Followed by Dr. Curley Double. -07/23/23 humira, retinA, and rhopressa  listed in pharmacy note as "resume as needed in CIR or at discharge"; clarify when/if these need to be restarted while here 4-29: Should be OK to resume Retin A and Rhopressa ; will inquire about bringing from home. -- will check with Optho about Cellcept and Humira resumption.  4-30: Family brought Rhopressa  and Humira in from home; pharmacy holding Humira until clarified by Optho.  Rhopressa  resumed. 5-1: Spoke with Dr. Mason Sole, continue to hold Humira and CellCept pending outpatient follow-up with him shortly after discharge.  15. Urinary retention: Purewick being used--monitor voiding with PVR/bladder scan. Toilet every 4 hours  -4/25 PVR 64, continue to monitor   - 4-28: No recent PVRs documented, patient denies incontinence, monitor  16. Chronic Asthma: Continue Dulera  BID and singulair  10mg  nightly, azelastin BID --Followed by Dr. Idolina Maker.   17. H/o chronic gastritis w/nausea and vomiting: D/c Ibuprofen . Continue Protonix  40 mg daily and Pepcid  40mg   nightly; was on reglan  at home, has PRN compazine  here. Carafate  1g BID.  -- Followed by [facility].  No complaints while at rehab  18. Abnormal LFTs: Normal liver parenchyma and GB sludge noted on abdominal ultrasound 07/16/23.  --Resolving. Recheck in am-pending - 07/23/23 LFTs a bit improved 4/26, repeat CMP tomorrow morning; of note, HepB SAb reactive, core Ab positive 4-28: LFTs looking better  19. Peripheral polyneuropathy: Cymbalta  resumed 04/23 as CK trending down. Will check weekly -- See #3 above; may benefit from transition to Lyrica or trial of Qutenza as outpatient.  Was establishing with Dr. Rayleen Cal  18. Anemia of chronic disease: Likely malabsorption w/ Low iron but elevated TIBC/Ferritin -07/23/23 Hgb 9.9 on Fri, repeat weekly on Mon/Thurs; iron studies done 4/21 showing low TIBC 172, iron 46, ferritin elevated 791.  -07/29/23 Hgb stable yesterday; monitor  19. Morbid obesity: BMI 48. Followed by RD/Bariatric nutrition for wt loss. Plan for Bariatric surgery 08/2023.              --Now on aggressive vitamin supplementation as above  20. Hyponatremia: Na down to 132-->question due to IVIG 4/31-4/24.  --May see rise in LFTs again.  -4/25 recheck labs still pending, will ask nursing to call to check on this -07/23/23 Na 131 on 4/26, recheck tomorrow to see trend or need for supplementation  4/28: NA stable 132. 5/1: Na 134, stable>> 3.1 5/2; monitor  21. Severe tricuspid regurgitation/grade 1 diastolic heart failure   - 4/29: add daily weights with diuretic adjustments--stable -5/2: Weights remain stable, no external edema with DC hydrochlorothiazide .  Continue to monitor closely. -07/30/23 wt down significantly today, wonder if it's inaccurate; monitor trend -5/5 wt back to 140kg, continue to monitor Filed Weights   07/30/23 0500 07/30/23 1716 07/31/23 0900  Weight: (!) 140 kg 136 kg Aaron Aas)  140 kg    22. Gout: continue allopurinol  100mg  QD; off colchicine  for now, resume as  appropriate   23. Diarrhea/constipation: -07/23/23 pt now having runny stools, thinks it's the food; stop miralax  daily and change to PRN; monitor 4-28: Ongoing liquid stool; encourage p.o. fluids, replete electrolytes as above, and adding fibercon supplemen\t   - 4/29: No Bms today; slowing down. [place] - 4-30: Diarrhea followed by smears, KUB performed to ensure no overflow incontinence, was normal. 5/1: KUB normal, patient feels she is having adequate bowel movements.   Last bowel movement 5-2, adequate per patient  5/5 LBM yesterday 24. Dysuria:  -07/29/23 pt stating some dysuria/difficulty with urination; ordered U/A -07/30/23 U/A not done yesterday d/t BMs everytime she urinated; will try urinal today; f/up on results  -4/5/5 U/A 5/3 with 21-50 WBC, many bacteria start kelfex for suspected UTI, culture added  LOS: 11 days A FACE TO FACE EVALUATION WAS PERFORMED  Lylia Sand 07/31/2023, 3:08 PM

## 2023-07-31 NOTE — Progress Notes (Signed)
 Encouraged fluids, extra fluids at bedside    Randeen Busman, LPN

## 2023-07-31 NOTE — Progress Notes (Signed)
 Physical Therapy Session Note  Patient Details  Name: Tiffany Velasquez MRN: [id number] Date of Birth: 03/06/1996  Today's Date: 07/31/2023 PT Individual Time: 1435-1530 PT Individual Time Calculation (min): 55 min   Short Term Goals: Week 1:  PT Short Term Goal 1 (Week 1): Pt will roll side to side w/ supervision. PT Short Term Goal 1 - Progress (Week 1): Met PT Short Term Goal 2 (Week 1): Pt will transfer sup to sit w/ min A consistently and safely. PT Short Term Goal 2 - Progress (Week 1): Met PT Short Term Goal 3 (Week 1): Pt will transfer sit to stand from elevated surface w/ mod A +1 consistently. PT Short Term Goal 3 - Progress (Week 1): Partly met PT Short Term Goal 4 (Week 1): PT to assess gait. PT Short Term Goal 4 - Progress (Week 1): Met  Skilled Therapeutic Interventions/Progress Updates:    Pt presents in room in bed seated upright in long sitting with pt requesting to use restroom. Pt denies pain or dizziness, reporting fear of ambulating to bathroom due to fall in previous week with bathroom transfer however motivated to ambulate. Session focused on therapeutic activities for upright tolerance, standing balance, and transfer training with therapeutic rest breaks.  Pt completes stand to RW, ambulates to bathroom with min assist ~20' over threshold, demonstrating minor truncal and BLE ataxia with increased speed noted (pt reports that prior to admission she was a "fast walker"), completes transfer to toilet with CGA with min assist for managing LB dressing in standing. Pt continent of void, charted, and completes periarea hygiene in sitting with supervision. Pt stands and manages pants without BUE support on RW with CGA for postural stability. Pt ambulates to sink ~20' with RW CGA, maintains standing for hand hygiene with skilled cues for sequencing. Pt completes 360* turn with RW to come to sitting in WC.  Pt transported to day room dependently. Pt ambulates with RW CGA 170' with +2  WC follow for safety, pt takes x3 self selected standing rest breaks for breathing. Pt requires extended seated rest break following gait training due to pt fatigue. Pt then completes ambulatory transfer to elevated mat for transfer training. Pt completes x5 sit<>stands from elevated mat to RW with supervision, then ambulates back to [facility] with CGA.  Pt transported back to room, completes ambulatory transfer back to bed NO DEVICE with min assist and remains seated on EOB with all needs within reach, call light in place, and family in room at end of sessoin.  Therapy Documentation Precautions:  Precautions Precautions: Fall Recall of Precautions/Restrictions: Intact Precaution/Restrictions Comments: 6 falls recently Restrictions Weight Bearing Restrictions Per Provider Order: No   Therapy/Group: Individual Therapy  Annia Kilts PT, DPT 07/31/2023, 4:21 PM

## 2023-07-31 NOTE — Plan of Care (Signed)
  Problem: Consults Goal: RH GENERAL PATIENT EDUCATION Description: See Patient Education module for education specifics. Outcome: Progressing   Problem: RH BOWEL ELIMINATION Goal: RH STG MANAGE BOWEL WITH ASSISTANCE Description: STG Manage Bowel with supervision Assistance. Outcome: Progressing   Problem: RH BLADDER ELIMINATION Goal: RH STG MANAGE BLADDER WITH ASSISTANCE Description: STG Manage Bladder With supervision Assistance Outcome: Progressing   Problem: RH SKIN INTEGRITY Goal: RH STG SKIN FREE OF INFECTION/BREAKDOWN Description: Manage skin free of infection/breakdown with supervision Outcome: Progressing   Problem: RH SAFETY Goal: RH STG ADHERE TO SAFETY PRECAUTIONS W/ASSISTANCE/DEVICE Description: STG Adhere to Safety Precautions With supervision  Assistance/Device. Outcome: Progressing   Problem: RH PAIN MANAGEMENT Goal: RH STG PAIN MANAGED AT OR BELOW PT'S PAIN GOAL Description: <4 w/ prns Outcome: Progressing   Problem: RH KNOWLEDGE DEFICIT GENERAL Goal: RH STG INCREASE KNOWLEDGE OF SELF CARE AFTER HOSPITALIZATION Description: Manage increase knowledge of self care after hospitalization with supervision from mother using educational materials provided Outcome: Progressing

## 2023-07-31 NOTE — Progress Notes (Signed)
 Occupational Therapy Session Note  Patient Details  Name: RIJA SHAMBAUGH MRN: [id number] Date of Birth: 03-Feb-1996  Today's Date: 07/31/2023 OT Individual Time: 4098-1191 OT Individual Time Calculation (min): 70 min    Short Term Goals: Week 1:  OT Short Term Goal 1 (Week 1): Pt will transfer with LRAD with mod +2 to [facility] OT Short Term Goal 1 - Progress (Week 1): Met OT Short Term Goal 2 (Week 1): Pt will consistenly get to EOB with bed rail and features wtih min guard in prep for ADL OT Short Term Goal 2 - Progress (Week 1): Met OT Short Term Goal 3 (Week 1): Pt will don shirt with setup OT Short Term Goal 3 - Progress (Week 1): Met OT Short Term Goal 4 (Week 1): Pt will thread LB clothing with mod A OT Short Term Goal 4 - Progress (Week 1): Met Week 2:  OT Short Term Goal 1 (Week 2): Pt will hike LB garments with Min A + LRAD. OT Short Term Goal 2 (Week 2): Pt will perform 1/3 toileting activities with Min A + LRAD. OT Short Term Goal 3 (Week 2): Pt will perform toilet transfer with consistent Min A + LRAD.   Skilled Therapeutic Interventions/Progress Updates:    Pt bed level at time of session, denied pain but states she has neuropathy in B feet, did not rate or affect session. Supine <> sit several times throughout session Supervision with bed rails, donning TEDS dependent but gripper socks with CGA in figure four. Declined bathing, UB dress Supervision, LB dress pt able to thread shorts but therapist hiking over hips in standing. Sit <> stands from low bed with MOD x2, but higher surface more MIN A, all at RW. Seated at EOB initially for self feeding breakfast, Supervision. Stand pivot with 2+ for safety, but CGA for transfer portion with RW. Set up at sink for oral hygiene and face washing all with Supervision. BP as follows:  Seated 127/99 with HR 99 Post transfer seated 125/91 with HR 98  Set up in TIS chair reclined alarm on call bell in reach with nursing.   Therapy  Documentation Precautions:  Precautions Precautions: Fall Recall of Precautions/Restrictions: Intact Precaution/Restrictions Comments: 6 falls recently Restrictions Weight Bearing Restrictions Per Provider Order: No    Therapy/Group: Individual Therapy  Doroteo Gasmen 07/31/2023, 7:23 AM

## 2023-07-31 NOTE — Progress Notes (Signed)
 Spoke with microbiology- clinician to also run culture per MD order.   Randeen Busman, LPN

## 2023-07-31 NOTE — Progress Notes (Signed)
 Per patient she slept well throughout the night. Sleep chart placed outside of room door.   Randeen Busman, LPN

## 2023-07-31 NOTE — Plan of Care (Signed)
   Problem: RH Wheelchair Mobility Goal: LTG Patient will propel w/c in controlled environment (PT) Description: LTG: Patient will propel wheelchair in controlled environment, # of feet with assist (PT) Outcome: Not Applicable Note: Discontinued due to progress Goal: LTG Patient will propel w/c in home environment (PT) Description: LTG: Patient will propel wheelchair in home environment, # of feet with assistance (PT). 07/31/2023 1627 by Annia Kilts, PT Outcome: Not Applicable Note: Discontinued due to progress 07/31/2023 1624 by Annia Kilts, PT Note: Discontinued due to progress     Problem: RH Balance Goal: LTG Patient will maintain dynamic standing balance (PT) Description: LTG:  Patient will maintain dynamic standing balance with assistance during mobility activities (PT) Flowsheets (Taken 07/31/2023 1624) LTG: Pt will maintain dynamic standing balance during mobility activities with:: Supervision/Verbal cueing Note: Upgraded due to progress   Problem: Sit to Stand Goal: LTG:  Patient will perform sit to stand with assistance level (PT) Description: LTG:  Patient will perform sit to stand with assistance level (PT) Flowsheets (Taken 07/31/2023 1624) LTG: PT will perform sit to stand in preparation for functional mobility with assistance level: Supervision/Verbal cueing Note: Upgraded due to progress   Problem: RH Bed to Chair Transfers Goal: LTG Patient will perform bed/chair transfers w/assist (PT) Description: LTG: Patient will perform bed to chair transfers with assistance (PT). Flowsheets (Taken 07/31/2023 1624) LTG: Pt will perform Bed to Chair Transfers with assistance level: Supervision/Verbal cueing Note: Upgraded due to progress   Problem: RH Furniture Transfers Goal: LTG Patient will perform furniture transfers w/assist (OT/PT) Description: LTG: Patient will perform furniture transfers  with assistance (OT/PT). Flowsheets (Taken 07/31/2023 1624) LTG: Pt will perform  furniture transfers with assist:: Supervision/Verbal cueing Note: Upgraded due to progress   Problem: RH Ambulation Goal: LTG Patient will ambulate in controlled environment (PT) Description: LTG: Patient will ambulate in a controlled environment, # of feet with assistance (PT). Flowsheets (Taken 07/31/2023 1624) LTG: Pt will ambulate in controlled environ  assist needed:: Supervision/Verbal cueing LTG: Ambulation distance in controlled environment: 150' Note: Upgraded due to progress Goal: LTG Patient will ambulate in home environment (PT) Description: LTG: Patient will ambulate in home environment, # of feet with assistance (PT). Flowsheets (Taken 07/31/2023 1624) LTG: Pt will ambulate in home environ  assist needed:: Supervision/Verbal cueing

## 2023-07-31 NOTE — Plan of Care (Signed)
  Problem: Consults Goal: RH GENERAL PATIENT EDUCATION Description: See Patient Education module for education specifics. Outcome: Progressing   Problem: RH BOWEL ELIMINATION Goal: RH STG MANAGE BOWEL WITH ASSISTANCE Description: STG Manage Bowel with supervision Assistance. Outcome: Progressing   Problem: RH BLADDER ELIMINATION Goal: RH STG MANAGE BLADDER WITH ASSISTANCE Description: STG Manage Bladder With supervision Assistance Outcome: Progressing   Problem: RH SKIN INTEGRITY Goal: RH STG SKIN FREE OF INFECTION/BREAKDOWN Description: Manage skin free of infection/breakdown with supervision Outcome: Progressing   Problem: RH SAFETY Goal: RH STG ADHERE TO SAFETY PRECAUTIONS W/ASSISTANCE/DEVICE Description: STG Adhere to Safety Precautions With supervision  Assistance/Device. Outcome: Progressing   Problem: RH PAIN MANAGEMENT Goal: RH STG PAIN MANAGED AT OR BELOW PT'S PAIN GOAL Description: <4 w/ prns Outcome: Progressing

## 2023-08-01 ENCOUNTER — Encounter: Admitting: Physical Medicine & Rehabilitation

## 2023-08-01 DIAGNOSIS — M792 Neuralgia and neuritis, unspecified: Secondary | ICD-10-CM

## 2023-08-01 DIAGNOSIS — F419 Anxiety disorder, unspecified: Secondary | ICD-10-CM

## 2023-08-01 MED ORDER — ALPRAZOLAM 0.25 MG PO TABS
0.2500 mg | ORAL_TABLET | Freq: Two times a day (BID) | ORAL | Status: DC | PRN
  Administered 2023-08-04: 0.25 mg via ORAL
  Filled 2023-08-01: qty 1

## 2023-08-01 MED ORDER — TRAMADOL HCL 50 MG PO TABS
50.0000 mg | ORAL_TABLET | Freq: Two times a day (BID) | ORAL | Status: DC | PRN
  Administered 2023-08-05 – 2023-08-12 (×8): 50 mg via ORAL
  Filled 2023-08-01 (×12): qty 1

## 2023-08-01 NOTE — Patient Care Conference (Cosign Needed Addendum)
 Inpatient RehabilitationTeam Conference and Plan of Care Update Date: 08/01/2023   Time: 1043 am     Patient Name: Tiffany Velasquez      Medical Record Number: [id number]  Date of Birth: [birth year] Sex: Female         Room/Bed: 4W14C/4W14C-01 Payor Info: Payor: [employer] MEDICARE / Plan: [employer] MEDICARE HMO / Product Type: *No Product type* /    Admit Date/Time:  07/20/2023 10:06 PM  Primary Diagnosis:  Myositis associated antibody positive  Hospital Problems: Principal Problem:   Myositis associated antibody positive Active Problems:   Myositis   Adjustment disorder with mixed anxiety and depressed mood    Expected Discharge Date: Expected Discharge Date: 08/16/23  Team Members Present: Physician leading conference: Dr. Abelino Able Social Worker Present: Norval Been, LCSW Nurse Present: Jerene Monks, RN PT Present: Catilin Osborn, PT OT Present: Artemus Biles, OT SLP Present: Other (comment) Tally Faes, SLP) PPS Coordinator present : Jestine Moron, SLP     Current Status/Progress Goal Weekly Team Focus  Bowel/Bladder   Pt is continent of bowel/bladder   Pt to remain continent of bowel/bladder   Will assess qshift and PRN    Swallow/Nutrition/ Hydration               ADL's   Setup UB care; Mod A for LB care/toileting. Barriers: Fluctuating BP (increased diastolic), generalized weakness, decreased activity tolerance, fear of falling?, self-limtting behaviors/learned helplesness when fatigued/emotionally upset.   Min A   LB dressing, unilateral static stance for LB care, standing balance, functional transfers.    Mobility   modI bed mobility, minA gait 170' +2 WC follow with RW, up to [facility] for transfers   upgraded to supervision  barriers: self limiting behaviors, endurance; focus on transfers ambulation, endurance, balance    Communication                Safety/Cognition/ Behavioral Observations               Pain   Pt currently denies  pain   Pt to continue to deny pain   Will assess qshift and PRN    Skin   Pt's skin is intact   Pt's skin to remain intact  Will assess qshift and PRN      Discharge Planning:  Pt will d/c to home with her parents. Support from various family members. [facility] will need resumption orders. SW will confirm there are no barriers to discharge.    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 Patient has pain,urinary tract infection ,orthostasis: medication adjusted by MD. Patient limited by  generalized weakness, decreased activity tolerance, fear of falling, endurance, self-limtting behaviors/learned helplesness when fatigued/emotionally upset.   Patient on target to meet rehab goals: yes, Patient requires set up assist with upper body care. Patient requires mod assist with lower body care and transfers. Patient able to ambulate up to 170' + 2 WC follow using a rolling walker with minimal assistance. Overall goals at discharge are set for min assist to supervision.   *See Care Plan and progress notes for long and short-term goals.   Revisions to Treatment Plan:  Neuropsych Consult    Teaching Needs: Safety, medications, toileting, transfers, dietary modifications,etc   Current Barriers to Discharge: Decreased caregiver support, Weight, and Behavior  Possible Resolutions to Barriers: Family Education Home health follow up     Medical Summary Current Status: myositis , pain,  low potassium, HTN, diarrhea, UTI  Barriers to Discharge: Electrolyte abnormality;Hypotension;Medical stability;Self-care education;Uncontrolled Pain  Barriers to Discharge Comments: myositis , pain, low potassium, HTN, diarrhea, UTI Possible Resolutions to [employer] Focus: Will add tramadol for severe pain, monitor K+, monitor BP, ABX for UTI, follow culture urine   Continued Need for Acute Rehabilitation Level  of Care: The patient requires daily medical management by a physician with specialized training in physical medicine and rehabilitation for the following reasons: Direction of a multidisciplinary physical rehabilitation program to maximize functional independence : Yes Medical management of patient stability for increased activity during participation in an intensive rehabilitation regime.: Yes Analysis of laboratory values and/or radiology reports with any subsequent need for medication adjustment and/or medical intervention. : Yes   I attest that I was present, lead the team conference, and concur with the assessment and plan of the team.   Jerene Monks 08/01/2023, 1043 am

## 2023-08-01 NOTE — Plan of Care (Signed)
  Problem: Consults Goal: RH GENERAL PATIENT EDUCATION Description: See Patient Education module for education specifics. Outcome: Progressing   Problem: RH BOWEL ELIMINATION Goal: RH STG MANAGE BOWEL WITH ASSISTANCE Description: STG Manage Bowel with supervision Assistance. Outcome: Progressing   Problem: RH BLADDER ELIMINATION Goal: RH STG MANAGE BLADDER WITH ASSISTANCE Description: STG Manage Bladder With supervision Assistance Outcome: Progressing   Problem: RH SKIN INTEGRITY Goal: RH STG SKIN FREE OF INFECTION/BREAKDOWN Description: Manage skin free of infection/breakdown with supervision Outcome: Progressing   Problem: RH SAFETY Goal: RH STG ADHERE TO SAFETY PRECAUTIONS W/ASSISTANCE/DEVICE Description: STG Adhere to Safety Precautions With supervision  Assistance/Device. Outcome: Progressing   Problem: RH PAIN MANAGEMENT Goal: RH STG PAIN MANAGED AT OR BELOW PT'S PAIN GOAL Description: <4 w/ prns Outcome: Progressing   Problem: RH KNOWLEDGE DEFICIT GENERAL Goal: RH STG INCREASE KNOWLEDGE OF SELF CARE AFTER HOSPITALIZATION Description: Manage increase knowledge of self care after hospitalization with supervision from mother using educational materials provided Outcome: Progressing

## 2023-08-01 NOTE — Progress Notes (Signed)
 PROGRESS NOTE   Subjective/Complaints: Pt continues to have a lot of pain, mostly at night in her feet.  She is happy with her progress with therapy today, easier to transfer.  Pt noted to have episodes of anxiety.    ROS: as per HPI. Denies CP, SOB, abd pain, N/V/D/C, or any other complaints at this time.   + burning pain both feet--improved + Insomnia, improved + Orthostatic hypotension, ongoing + dysuria- resolved + Anxiety  Objective:   No results found.   Recent Labs    07/31/23 0352  WBC 2.9*  HGB 8.5*  HCT 27.5*  PLT 296    Recent Labs    07/31/23 0352  NA 136  K 3.3*  CL 114*  CO2 17*  GLUCOSE 87  BUN 16  CREATININE 0.53  CALCIUM  9.0    Intake/Output Summary (Last 24 hours) at 08/01/2023 0950 Last data filed at 07/31/2023 1724 Gross per 24 hour  Intake 420 ml  Output --  Net 420 ml        Physical Exam: Vital Signs Blood pressure (!) 86/75, pulse 80, temperature 98.6 F (37 C), temperature source Oral, resp. rate 19, height 5\' 9"  (1.753 m), weight (!) 140 kg, last menstrual period 06/27/2023, SpO2 100%.    General: NAD, sitting up in bed.  Obese.  HEENT: Head is normocephalic, atraumatic, MMM Neck: Supple without JVD or lymphadenopathy Heart: Regular rate and rhythm, no m/r/g appreciated Chest: CTA bilaterally without wheezes, rales, or rhonchi; no distress. Port R chest  Abdomen: Soft, non-tender, non-distended, bowel sounds positive. Extremities: No clubbing, cyanosis, Tr b/l edema.  Psych: Appropriate, cooperative Skin: Clean and intact without signs of breakdown over exposed surfaces.  Right chest port C-D-I  Neuro:  Awake, alert, oriented x 4.  No apparent cognitive deficits. Strength antigravity 4- out of 5 bilateral upper extremities, 4-/5 bilateral hip flexors, 4 out of 5 distal lower extremities. Hypersensitivity to light touch in bilateral feet/ankles.  Otherwise, sensation  intact No abnormal tone noted   Assessment/Plan: 1. Functional deficits which require 3+ hours per day of interdisciplinary therapy in a comprehensive inpatient rehab setting. Physiatrist is providing close team supervision and 24 hour management of active medical problems listed below. Physiatrist and rehab team continue to assess barriers to discharge/monitor patient progress toward functional and medical goals  Care Tool:  Bathing    Body parts bathed by patient: Right arm, Left arm, Chest, Abdomen, Front perineal area, Face   Body parts bathed by helper: Buttocks, Right upper leg, Left upper leg, Right lower leg, Left lower leg     Bathing assist Assist Level: Moderate Assistance - Patient 50 - 74%     Upper Body Dressing/Undressing Upper body dressing   What is the patient wearing?: Pull over shirt    Upper body assist Assist Level: Supervision/Verbal cueing    Lower Body Dressing/Undressing Lower body dressing    Lower body dressing activity did not occur: Environmental limitations (no clothes available) What is the patient wearing?: Pants     Lower body assist Assist for lower body dressing: Maximal Assistance - Patient 25 - 49%     Toileting Toileting  Toileting assist Assist for toileting: Dependent - Patient 0%     Transfers Chair/bed transfer  Transfers assist     Chair/bed transfer assist level: Minimal Assistance - Patient > 75%     Locomotion Ambulation   Ambulation assist   Ambulation activity did not occur: Safety/medical concerns  Assist level: 2 helpers Assistive device: Walker-rolling Max distance: 175'   Walk 10 feet activity   Assist  Walk 10 feet activity did not occur: Safety/medical concerns  Assist level: Minimal Assistance - Patient > 75% Assistive device: Walker-rolling   Walk 50 feet activity   Assist Walk 50 feet with 2 turns activity did not occur: Safety/medical concerns  Assist level: 2 helpers (+2 WC  follow) Assistive device: Walker-rolling    Walk 150 feet activity   Assist Walk 150 feet activity did not occur: Safety/medical concerns  Assist level: 2 helpers Assistive device: Walker-rolling    Walk 10 feet on uneven surface  activity   Assist Walk 10 feet on uneven surfaces activity did not occur: Safety/medical concerns         Wheelchair     Assist Is the patient using a wheelchair?: No             Wheelchair 50 feet with 2 turns activity    Assist            Wheelchair 150 feet activity     Assist          Blood pressure (!) 86/75, pulse 80, temperature 98.6 F (37 C), temperature source Oral, resp. rate 19, height 5\' 9"  (1.753 m), weight (!) 140 kg, last menstrual period 06/27/2023, SpO2 100%.  Medical Problem List and Plan: 1. 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 If unaccessed?             -ELOS/Goals: 2-4 weeks -  min A hopefully - 08/11/23             -Continue CIR  - 4/29: Having help from mom/dad at home in discharge. Mod-Max A ADLs with bed-level LB. Biggest barriers are weakness and some self-limiting behaviors. Walked 15 feet with EVA walker. Regular walker STS today.  -Team conference  note completed  2.  Antithrombotics: -DVT/anticoagulation:  Pharmaceutical: Lovenox  70mg  daily             -antiplatelet therapy: N/A 3. Pain Management: Has been using oxycodone  prn past 3 days? For foot, leg and back pain- also has Gabapentin  800mg  TID (increased 4/25) - duloxetine  stopped initially due to myositis- 30 mg daily resumed 4/23 -07/23/23 feet very sensitive, but cymbalta  and gabapentin  recently adjusted, lidocaine  cream not enough so asked that pharmacy send 15g tubes per treatment, to adequately cover the plantar surfaces of  her feet to see if we can get relief.  4-28: Remains very sensitive, does feel some improvement 4-29: Patient with some daytime lethargy, pain primarily at night.  Reduce daytime gabapentin  to 600 mg every morning/600 mg q. afternoon, increase nighttime dose to 1000 mg.  Increase duloxetine  to 30 mg twice daily. 4-30: Patient did much better overnight with the above changes. 5-1: Discussed with patient further weaning daytime gabapentin ; she feels good on current regimen and wishes to hold off at this time 5/5 add prn robaxin for muscle spams 5/6 tramadol PRN started  4. Mood/Behavior/Sleep: LCSW to follow for evaluation and support.              -antipsychotic agents: N/A  -Neuropsych consult  -07/23/23 didn't sleep well, has melatonin PRN to use - 4-28: Sleep interrupted by pain as above.  Increasing gabapentin  and duloxetine ; may benefit from transition to Elavil nightly If no improvement 4-30: Sleeping better.  Thinks melatonin as needed is over sedating, advised that this is per her request.  5. Neuropsych/cognition: This patient is capable of making decisions on her own behalf. - 4-28: Dr. Cheryll Corti evaluated today; appreciate his professional assessment  6. Skin/Wound Care: Routine pressure relief measures.   - Chest port appears clean, routine dressing changes  7. Fluids/Electrolytes/Nutrition: Monitor I/O. Continue Ensure supplements and vitamin supplementation -07/22/23 hypoK+ 3.4, KCL 40meq po x 1- recheck 4/28 -07/23/23 added labs for tomorrow (Mg/phos, CMP, CK) 4/28: Hypokalemia worsened to 2.9 status post 40 mill equivalents; add 40 mill equivalents KCl PO, +40 mill equivalents IV today.  Repeat BMP tonight and tomorrow a.m. Phos elevated, so we will avoid K-Phos--repeat potassium 3.4 at 6 PM. 4-29: Hypokalemia resolved.  Repeat labs Thursday. 5/1: Potassium back to 2.9.  DC HCTZ.  Add 40 mill equivalents daily p.o. repletion, another 20 mill equivalents IV today.  Repeat labs  in AM. 07/29/23 K 3.1 yesterday, will do 20mEq IV once today and continue the 40mEq PO, repeat labs Monday 5/5 K+ up to 3.3, increase to 30meq BID  Recheck bmp tomorrow  8. Non-specific myositis: Cymbalta  d/c by neurology due to concerns of rhabdomyolysis --Cymbalta  30 mg resumed 04/23 given improving myositis.  --Monitor CK intermittently improved from 1536-->514--> 398 --consider muscle biopsy if symptoms do not improve -07/23/23 CK to be done tomorrow and weekly on Mon 4-28: CK downtrending, continue to trend 5/1: CK has normalized.  9. Multifocal PNA: Likely aspiration--most significant in lingula and RLL on CT w/o contrast chest 07/18/23 --Hx of chronic intermittent N/V. May need swallow evaluation. Swallow precautions added.   --started on Augmentin  04/22-->04/25 for 5 day course.  -No apparent signs of respiratory distress  11. Multiple Vitamin deficiencies: Vitamin B1  250 mg daily 4/22-->4/28 followed by 100 mg daily IV.  --now on B12 IM+PO, IV folate. Vitamin A  10,000 units daily. Vitamin D  50,000/wk, and MVI --recheck Mg, Phos (supplemented)-- ordered for Monday 4/28--Phos improving, magnesium  stable  12. Resting tachycardia: HR has been 110-120 range at baseline --not on any BB -Start metoprolol  12.5 mg BID -07/23/23 still tachycardic but less, monitor  - intermittent, asymptomatic. Monitor     08/01/2023    5:12 AM 08/01/2023    5:08 AM 08/01/2023    5:03 AM  Vitals with BMI  Weight 308 lbs 10 oz    BMI 45.56    Systolic   86  Diastolic   75  Pulse  80 107     13. Accelerated HTN/orthostatic hypotension: BP poorly controlled. Continue hydrochlorothiazide  12.5mg  daily and Avapro  150mg  daily -- addition of BB? -4/25 BB started as in #12 -07/23/23 BPs better, monitor; of note, pharmacy note states pt was on amlodipine  previously? Remain off for now, but note this for d/c 4/28 : reduce HCTZ to 6.125 due to recurrent hypokalemia--BP stable 5/1: BUN elevated, blood pressure  soft, hypokalemia as above; DC hydrochlorothiazide .  Got 500 cc IV fluid bolus this afternoon. 5-2: Remains low, irbesartan  reduced to 75 mg this AM; DC given ongoing symptomatic orthostasis, can consider resumption once BP comes up.  Will give 1  L IV fluids today and patient thinks she can tolerate TED hose now, so we will add this on.  Will get orthostatics after IV fluid. 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 Latanoprost  and alphagan  gtts             --IVIG 04/21- 04/24. Followed by Dr. Curley Double. -07/23/23 humira, retinA, and rhopressa  listed in pharmacy note as "resume as needed in CIR or at discharge"; clarify when/if these need to be restarted while here 4-29: Should be OK to resume Retin A and Rhopressa ; will inquire about bringing from home. -- will check with Optho about Cellcept and Humira resumption.  4-30: Family brought Rhopressa  and Humira in from home; pharmacy holding Humira until clarified by Optho.  Rhopressa  resumed. 5-1: Spoke with Dr. Mason Sole, continue to hold Humira and CellCept pending outpatient follow-up with him shortly after discharge.  15. Urinary retention: Purewick being used--monitor voiding with PVR/bladder scan. Toilet every 4 hours  -4/25 PVR 64, continue to monitor   - 4-28: No recent PVRs documented, patient denies incontinence, monitor  16. Chronic Asthma: Continue Dulera  BID and singulair  10mg  nightly, azelastin BID --Followed by Dr. Idolina Maker.   17. H/o chronic gastritis w/nausea and vomiting: D/c Ibuprofen .  Continue Protonix  40 mg daily and Pepcid  40mg  nightly; was on reglan  at home, has PRN compazine  here. Carafate  1g BID.  -- Followed by [facility].  No complaints while at rehab  18. Abnormal LFTs: Normal liver parenchyma and GB sludge noted on abdominal ultrasound 07/16/23.  --Resolving. Recheck in am-pending - 07/23/23 LFTs a bit improved 4/26, repeat CMP tomorrow morning; of note, HepB SAb reactive, core Ab positive 4-28: LFTs looking better  19. Peripheral polyneuropathy: Cymbalta  resumed 04/23 as CK trending down. Will check weekly -- See #3 above; may benefit from transition to Lyrica or trial of Qutenza as outpatient.  Was establishing with Dr. Rayleen Cal  18. Anemia of chronic disease: Likely malabsorption w/ Low iron but elevated TIBC/Ferritin -07/23/23 Hgb 9.9 on Fri, repeat weekly on Mon/Thurs; iron studies done 4/21 showing low TIBC 172, iron 46, ferritin elevated 791.  -07/29/23 Hgb stable yesterday; monitor  19. Morbid obesity: BMI 48. Followed by RD/Bariatric nutrition for wt loss. Plan for Bariatric surgery 08/2023.              --Now on aggressive vitamin supplementation as above  20. Hyponatremia: Na down to 132-->question due to IVIG 4/31-4/24.  --May see rise in LFTs again.  -4/25 recheck labs still pending, will ask nursing to call to check on this -07/23/23 Na 131 on 4/26, recheck tomorrow to see trend or need for supplementation  4/28: NA stable 132. 5/1: Na 134, stable>> 3.1 5/2; monitor  21. Severe tricuspid regurgitation/grade 1 diastolic heart failure   - 4/29: add daily weights with diuretic adjustments--stable -5/2: Weights remain stable, no external edema with DC hydrochlorothiazide .  Continue to monitor closely. -07/30/23 wt down significantly today, wonder if it's inaccurate; monitor trend -5/5-6 wt back to 140kg, continue to monitor [employer]   07/30/23  1716 07/31/23 0900 08/01/23 0512  Weight: 136 kg (!) 140 kg (!) 140 kg    22. Gout: continue  allopurinol  100mg  QD; off colchicine  for now, resume as appropriate   23. Diarrhea/constipation: -07/23/23 pt now having runny stools, thinks it's the food; stop miralax  daily and change to PRN; monitor 4-28: Ongoing liquid stool; encourage p.o. fluids, replete electrolytes as above, and adding fibercon supplemen\t   - 4/29: No Bms today; slowing down. [place] - 4-30: Diarrhea followed by smears, KUB performed to ensure no overflow incontinence, was normal. 5/1: KUB normal, patient feels she is having adequate bowel movements.   Last bowel movement 5-2, adequate per patient  5/6 LMB 2 days ago, consider additional medication if no BM today 24. Dysuria:  -07/29/23 pt stating some dysuria/difficulty with urination; ordered U/A -07/30/23 U/A not done yesterday d/t BMs everytime she urinated; will try urinal today; f/up on results  -4/5/5 U/A 5/3 with 21-50 WBC, many bacteria start kelfex for suspected UTI, culture added -4/6 symptoms improved, follow urine culture  25. Anxiety  -xanax 0.25mg  PRN  LOS: 12 days A FACE TO FACE EVALUATION WAS PERFORMED  Tiffany Velasquez 08/01/2023, 9:50 AM

## 2023-08-01 NOTE — Progress Notes (Signed)
 Occupational Therapy Session Note  Patient Details  Name: Tiffany Velasquez MRN: [id number] Date of Birth: 1995-09-09  Today's Date: 08/01/2023 OT Individual Time: 0950-1030 OT Individual Time Calculation (min): 40 min    Short Term Goals: Week 2:  OT Short Term Goal 1 (Week 2): Pt will hike LB garments with Min A + LRAD. OT Short Term Goal 2 (Week 2): Pt will perform 1/3 toileting activities with Min A + LRAD. OT Short Term Goal 3 (Week 2): Pt will perform toilet transfer with consistent Min A + LRAD.  Skilled Therapeutic Interventions/Progress Updates:  Pt received resting in bed, un-rated neuropathic pain in B feet, rest provided as needed. Pt performs bed mobility with supervision + bed features. Sit>stand from slightly elevated bed with CGA + RW, significant forward lean to power-up, stand-step transfer from EOB>TIS WC in similar fashion. Vitals assessed with diastolic BP in 100's, LPN present/made aware, administering morning medications. Pt then completes household-level distance for BLE strengthening and activity tolerance for carryover into ADL participation. Pt requires CGA + RW and self-directs standing rest-breaks, cuing for pursed-lipped breathing. Pt remained sitting in TIS WC with all immediate needs met.   Therapy Documentation Precautions:  Precautions Precautions: Fall Recall of Precautions/Restrictions: Intact Precaution/Restrictions Comments: 6 falls recently Restrictions Weight Bearing Restrictions Per Provider Order: No   Therapy/Group: Individual Therapy  Artemus Biles, OTR/L, MSOT  08/01/2023, 6:23 AM

## 2023-08-01 NOTE — Progress Notes (Signed)
 Physical Therapy Session Note  Patient Details  Name: Tiffany Velasquez MRN: [id number] Date of Birth: 04/10/1995  Today's Date: 08/01/2023 PT Individual Time: 4098-1191 + 4782-9562 PT Individual Time Calculation (min): 69 min + 71 min  Short Term Goals: Week 2:  PT Short Term Goal 1 (Week 2): Pt will ambulate with RW 100' with min assist PT Short Term Goal 2 (Week 2): Pt will completes sit<>stands with min assist consistently PT Short Term Goal 3 (Week 2): Pt will complete up/down 4 steps with BHRs min assist  Skilled Therapeutic Interventions/Progress Updates:   SESSION 1:  Pt presents in room in bed, agreeable to PT. Pt denies pain at this time but requires increased time for discussing issue with nursing staff not assisting to restroom last night. Therapist updates safety plan to improve communication with pt current mobility and nursing staff. Therapist dons ted hose total assist due to pt with low BP noted with orthostatic vitals in standing this AM. Pt completes bed mobility with modI using hospital bed features. Pt completes sit<>stand from bed, WC, and toilet with CGA/min assist throughout session to RW. Pt ambulates to/from bathroom ~20' with CGA with RW improved ability to manage door and threshold with decreasing assistance this session. Pt completes 3/3 toileting tasks with supervision. Pt requires rest break following toileting in WC. Pt transported to day room via WC. Pt completes ambulation 170' with CGA RW and +2 WC follow for safety, requires x2 standing rest breaks for ~20 seconds each during ambulation, improved from yesterday's session. Pt requires extended seated rest break, then completes mass practice gait training without device with cues for BOS 6x10' from [facility] to EOM. Pt with increased anxiety initially requiring min assist x2 for participation with task, progress to CGA x1. Pt continues to demonstrate global coordination deficits secondary to debility. Pt then completes NMR  completed in standing and sitting for BUE/BLE/core muscle fiber recruitment including: - x5 sit<>stand hands on knees - x5 modified sit ups in short sitting - x5 weighted sit ups in short sitting (4# ball( - x10 weighted sit ups with toss/catch with 2nd person (4# ball) Pt ambulates back to [facility] with min assist and no device, completes ambulatory transfer with no device back to bed, completes bed mobility modI sit to supine.   SESSION 2: Pt presents in room in bed, agreeable to PT, pt brother present for session. Pt denies pain at this time, states took a muscle relaxer earlier due to some BLE soreness. Session focused on gait training for stair negotiation, therapeutic activities for therapeutic rest break and listening, and therapetuic exercise for BLE strengthening and muscle fiber recruitment needed for functional transfers and ambulation. Pt completes bed mobility modI, completes transfers without device with min assist, completes sit<>stand transfers with RW with CGA. Pt transported to main gym via WC, completes step ups x7 on 3" step with BUE support on BHRs ascending with RLE only, therapist providing min assist and 2nd person positioned in front of pt providing CGA for knees to prevent buckling throughout gait training. Pt then completes step up to 6" step x1 LLE, x2 LLE, and x1 RLE with BHRs with pt demonstrating excessive forward trunk lean however no knee buckling, min assist for task. Pt requires extended setaed rest breaks between trials with pt requiring therapeutic listening due to pt reflecting on health scare and hospitalization. Pt then completes seated therex to promote BLE strengthening inclduing: - seated marches 2x20 BLE 2# weight - seated LAQs x10 BLE  2# weight - seated hamstring curl red tband x10 BLE - seated hip extension (stomp) red tband x10 BLE - seated hip abduction red tband x20 - seated hip adduction ball squeeze x20 Pt returns to room and completes stand step transfer  without device with all needs within reach call light in place and family at bedside at end of session.    Therapy Documentation Precautions:  Precautions Precautions: Fall Recall of Precautions/Restrictions: Intact Precaution/Restrictions Comments: 6 falls recently Restrictions Weight Bearing Restrictions Per Provider Order: No   Therapy/Group: Individual Therapy  Annia Kilts PT, DPT 08/01/2023, 2:20 PM

## 2023-08-01 NOTE — Progress Notes (Signed)
 Patient ID: Tiffany Velasquez, female   DOB: [birth year], 28 y.o.   MRN: [id number]  SW met with pt and pt brother in room to provide updates from team conference on gains made, and extension from 5/16 to 5/21.  Norval Been, MSW, LCSW Office: [phone] Cell: [phone] Fax: [phone]

## 2023-08-02 LAB — CULTURE, OB URINE: Culture: >=100000 — AB

## 2023-08-02 MED ORDER — BUSPIRONE HCL 10 MG PO TABS
5.0000 mg | ORAL_TABLET | Freq: Three times a day (TID) | ORAL | Status: DC
  Administered 2023-08-02 – 2023-08-16 (×42): 5 mg via ORAL
  Filled 2023-08-02 (×43): qty 1

## 2023-08-02 NOTE — Plan of Care (Signed)
  Problem: Consults Goal: RH GENERAL PATIENT EDUCATION Description: See Patient Education module for education specifics. Outcome: Progressing   Problem: RH BOWEL ELIMINATION Goal: RH STG MANAGE BOWEL WITH ASSISTANCE Description: STG Manage Bowel with supervision Assistance. Outcome: Progressing   Problem: RH BLADDER ELIMINATION Goal: RH STG MANAGE BLADDER WITH ASSISTANCE Description: STG Manage Bladder With supervision Assistance Outcome: Progressing   Problem: RH BLADDER ELIMINATION Goal: RH STG MANAGE BLADDER WITH ASSISTANCE Description: STG Manage Bladder With supervision Assistance Outcome: Progressing   Problem: RH SKIN INTEGRITY Goal: RH STG SKIN FREE OF INFECTION/BREAKDOWN Description: Manage skin free of infection/breakdown with supervision Outcome: Progressing   Problem: RH SAFETY Goal: RH STG ADHERE TO SAFETY PRECAUTIONS W/ASSISTANCE/DEVICE Description: STG Adhere to Safety Precautions With supervision  Assistance/Device. Outcome: Progressing

## 2023-08-02 NOTE — Progress Notes (Signed)
 Occupational Therapy Session Note  Patient Details  Name: Tiffany Velasquez MRN: [id number] Date of Birth: 1995-06-12  Today's Date: 08/02/2023 OT Individual Time: 4098-1191 OT Individual Time Calculation (min): 42 min (15 missed minutes due to headache/pain)  Today's Date: 08/02/2023 OT Individual Time: 4782-9562 OT Individual Time Calculation (min): 27 min   Short Term Goals: Week 2:  OT Short Term Goal 1 (Week 2): Pt will hike LB garments with Min A + LRAD. OT Short Term Goal 2 (Week 2): Pt will perform 1/3 toileting activities with Min A + LRAD. OT Short Term Goal 3 (Week 2): Pt will perform toilet transfer with consistent Min A + LRAD.  Skilled Therapeutic Interventions/Progress Updates:   Session 1: Pt received resting in bed, reporting 5/10 headached, LPN present during session/administering medications. Sitting EOB, vitals assessed and noted below: BP=130/105 HR=115 O2=100  BP=128/91 HR=105 O2=100  Pt performs sit<>stand from heightened EOB with Min A + RW, x2 attempts. Pt tolerates static stance with supervision for time required to assess, numbers below: BP=61/35 HR=152 O2=100  Returned to supine with supervision, vitals re-assessed: BP=125/92 HR=93 O2=100  TEDs dependently donned at bed-level. See flowsheets for repetition of orthostatic vitals in appropriate order. Pt remained resting in bed with all immediate needs met, patient missing ~15 mins of skilled intervention due to pain.   Session 2: Pt received resting in bed, improved headache symptoms since early AM session. Pt performs bed mobility with supervision + bed features. Sit>stand from heightened EOB at [facility], ambulatory transfer from room<>day room with x3 self-directed standing rest-breaks to manage SOB. In day room, pt instructed in rote sit<>stands from varying mat heights, emphasis placed on BUE on mat vs RW to power up. Pt tolerates trials of up to 1 min in static stance within UE support, activity upgraded  to include BUE punches x20, CGA + no LOB noted throughout. VSS throughout session with TEDs donned. Pt remained sitting EOB with all immediate needs met, call bell within reach.   Therapy Documentation Precautions:  Precautions Precautions: Fall Recall of Precautions/Restrictions: Intact Precaution/Restrictions Comments: 6 falls recently Restrictions Weight Bearing Restrictions Per Provider Order: No   Therapy/Group: Individual Therapy  Artemus Biles, OTR/L, MSOT  08/02/2023, 6:23 AM

## 2023-08-02 NOTE — Progress Notes (Signed)
 Physical Therapy Session Note  Patient Details  Name: Tiffany Velasquez MRN: [id number] Date of Birth: [birth year]  Today's Date: 08/02/2023 PT Individual Time: 1446-1530 PT Individual Time Calculation (min): 44 min   Short Term Goals: Week 2:  PT Short Term Goal 1 (Week 2): Pt will ambulate with RW 100' with min assist PT Short Term Goal 2 (Week 2): Pt will completes sit<>stands with min assist consistently PT Short Term Goal 3 (Week 2): Pt will complete up/down 4 steps with BHRs min assist  Skilled Therapeutic Interventions/Progress Updates:    Pt supine in bed upon arrival. Pt awoken and agreeable to therapy. Pt denies any pain. Pt awoken and agreeable to therapy.   Vitals assessed:  Supine 108/73 HR 87  Sitting EOB 103/70 HR 92  Standing 94/64 HR 125-asymptomatic  Donned ted hose Post ambulation x40 feet 133/97 HR 93   Pt ambualted 1x40, x103, 1x50 feet with no AD and R HHA, pt demos wide BOS, and inconsistent step pattern/poor coordination (ataxic appearing) 2/2 debility. Verbal cues provided for slowing down.   Therapist utilized therapeutic use of self to provide support and encouragement to trial gait without AD 2/2 pt  anxiety and FOF.   Pt supine in bed with all needs within reach and bed alarm on.   Therapy Documentation Precautions:  Precautions Precautions: Fall Recall of Precautions/Restrictions: Intact Precaution/Restrictions Comments: 6 falls recently Restrictions Weight Bearing Restrictions Per Provider Order: No  Therapy/Group: Individual Therapy  [facility] [place], [place], DPT  08/02/2023, 4:00 PM

## 2023-08-02 NOTE — Progress Notes (Signed)
 Physical Therapy Session Note  Patient Details  Name: Tiffany Velasquez MRN: [id number] Date of Birth: [birth year]  Today's Date: 08/02/2023 PT Individual Time: 0902-0911 PT Individual Time Calculation (min): 9 min   Short Term Goals: Week 1:  PT Short Term Goal 1 (Week 1): Pt will roll side to side w/ supervision. PT Short Term Goal 1 - Progress (Week 1): Met PT Short Term Goal 2 (Week 1): Pt will transfer sup to sit w/ min A consistently and safely. PT Short Term Goal 2 - Progress (Week 1): Met PT Short Term Goal 3 (Week 1): Pt will transfer sit to stand from elevated surface w/ mod A +1 consistently. PT Short Term Goal 3 - Progress (Week 1): Partly met PT Short Term Goal 4 (Week 1): PT to assess gait. PT Short Term Goal 4 - Progress (Week 1): Met Week 2:  PT Short Term Goal 1 (Week 2): Pt will ambulate with RW 100' with min assist PT Short Term Goal 2 (Week 2): Pt will completes sit<>stands with min assist consistently PT Short Term Goal 3 (Week 2): Pt will complete up/down 4 steps with BHRs min assist  Skilled Therapeutic Interventions/Progress Updates:   Receied pt supine in bed asleep with covers pulled over her face. Upon wakening, pt reported having headache and having low BP this morning (premedicated). BP in supine: 127/85. Discussed events from prior OT session, and encouraged getting OOB and to take orthostatic vitals, however pt declined and requested to remain in bed until headache subsides. Pt left supine in bed with all needs within reach.   Therapy Documentation Precautions:  Precautions Precautions: Fall Recall of Precautions/Restrictions: Intact Precaution/Restrictions Comments: 6 falls recently Restrictions Weight Bearing Restrictions Per Provider Order: No     Therapy/Group: Individual Therapy Nicolas Barren Zaunegger Nena Bank PT, DPT 08/02/2023, 9:08 AM

## 2023-08-02 NOTE — Progress Notes (Signed)
 Followed up on patient regarding her mood and presumed concerns regarding d/c date. She reports high levels of anxiety concerning failure/recurrent weakness. Discussed her progress and that extension was indication her potential. Reached out to [place], TR to assist patient with relaxation techniques, aroma therapy and other therapeutic items. Patient denies being depressed and is open to start medication to help elevated anxiety levels. Buspar added.

## 2023-08-02 NOTE — Plan of Care (Signed)
  Problem: RH PAIN MANAGEMENT Goal: RH STG PAIN MANAGED AT OR BELOW PT'S PAIN GOAL Description: <4 w/ prns Outcome: Progressing

## 2023-08-02 NOTE — Progress Notes (Signed)
Pt refused orthostatic vitals

## 2023-08-02 NOTE — Progress Notes (Signed)
 PROGRESS NOTE   Subjective/Complaints: Reports pain is her feet is improved. Mild HA this AM improving with tylenol .    ROS: as per HPI. Denies CP, SOB, abd pain, N/V/D/C, or any other complaints at this time.   + burning pain both feet--improved + Insomnia, improved + Orthostatic hypotension, ongoing + dysuria- resolved + Anxiety- COntinued  Objective:   No results found.   Recent Labs    07/31/23 0352  WBC 2.9*  HGB 8.5*  HCT 27.5*  PLT 296    Recent Labs    07/31/23 0352  NA 136  K 3.3*  CL 114*  CO2 17*  GLUCOSE 87  BUN 16  CREATININE 0.53  CALCIUM  9.0    Intake/Output Summary (Last 24 hours) at 08/02/2023 1624 Last data filed at 08/02/2023 1316 Gross per 24 hour  Intake 478 ml  Output --  Net 478 ml        Physical Exam: Vital Signs Blood pressure 125/86, pulse 89, temperature 98.9 F (37.2 C), temperature source Oral, resp. rate 16, height 5\' 9"  (1.753 m), weight (!) 140 kg, last menstrual period 06/27/2023, SpO2 100%.    General: NAD, laying in bed. Appears comfortable.  HEENT: Head is normocephalic, atraumatic, MMM Neck: Supple without JVD or lymphadenopathy Heart: Regular rate and rhythm, no m/r/g appreciated Chest: CTA bilaterally without wheezes, rales, or rhonchi; no distress. Port R chest  Abdomen: Soft, non-tender, non-distended, bowel sounds positive. Extremities: No clubbing, cyanosis, Tr b/l edema.  Psych: Appropriate, cooperative Skin: Clean and intact without signs of breakdown over exposed surfaces.  Right chest port C-D-I  Neuro:  Awake, alert, oriented x 4.  No apparent cognitive deficits. Strength antigravity 4- out of 5 bilateral upper extremities, 4-/5 bilateral hip flexors, 4 out of 5 distal lower extremities. Hypersensitivity to light touch in bilateral feet/ankles.  Otherwise, sensation intact No abnormal tone noted   Assessment/Plan: 1. Functional deficits  which require 3+ hours per day of interdisciplinary therapy in a comprehensive inpatient rehab setting. Physiatrist is providing close team supervision and 24 hour management of active medical problems listed below. Physiatrist and rehab team continue to assess barriers to discharge/monitor patient progress toward functional and medical goals  Care Tool:  Bathing    Body parts bathed by patient: Right arm, Left arm, Chest, Abdomen, Front perineal area, Face   Body parts bathed by helper: Buttocks, Right upper leg, Left upper leg, Right lower leg, Left lower leg     Bathing assist Assist Level: Moderate Assistance - Patient 50 - 74%     Upper Body Dressing/Undressing Upper body dressing   What is the patient wearing?: Pull over shirt    Upper body assist Assist Level: Supervision/Verbal cueing    Lower Body Dressing/Undressing Lower body dressing    Lower body dressing activity did not occur: Environmental limitations (no clothes available) What is the patient wearing?: Pants     Lower body assist Assist for lower body dressing: Maximal Assistance - Patient 25 - 49%     Toileting Toileting    Toileting assist Assist for toileting: Dependent - Patient 0%     Transfers Chair/bed transfer  Transfers assist  Chair/bed transfer assist level: Minimal Assistance - Patient > 75%     Locomotion Ambulation   Ambulation assist   Ambulation activity did not occur: Safety/medical concerns  Assist level: 2 helpers Assistive device: Walker-rolling Max distance: 175'   Walk 10 feet activity   Assist  Walk 10 feet activity did not occur: Safety/medical concerns  Assist level: Minimal Assistance - Patient > 75% Assistive device: Walker-rolling   Walk 50 feet activity   Assist Walk 50 feet with 2 turns activity did not occur: Safety/medical concerns  Assist level: 2 helpers (+2 WC follow) Assistive device: Walker-rolling    Walk 150 feet  activity   Assist Walk 150 feet activity did not occur: Safety/medical concerns  Assist level: 2 helpers Assistive device: Walker-rolling    Walk 10 feet on uneven surface  activity   Assist Walk 10 feet on uneven surfaces activity did not occur: Safety/medical concerns         Wheelchair     Assist Is the patient using a wheelchair?: No             Wheelchair 50 feet with 2 turns activity    Assist            Wheelchair 150 feet activity     Assist          Blood pressure 125/86, pulse 89, temperature 98.9 F (37.2 C), temperature source Oral, resp. rate 16, height 5\' 9"  (1.753 m), weight (!) 140 kg, last menstrual period 06/27/2023, SpO2 100%.  Medical Problem List and Plan: 1. 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 If unaccessed?             -ELOS/Goals: 2-4 weeks -  min A hopefully             -Continue CIR  - 4/29: Having help from mom/dad at home in discharge. Mod-Max A ADLs with bed-level LB. Biggest barriers are weakness and some self-limiting behaviors. Walked 15 feet with EVA walker. Regular walker STS today.  -Team conference  note completed -Expected DC was adjusted to 5/21  2.  Antithrombotics: -DVT/anticoagulation:  Pharmaceutical: Lovenox  70mg  daily             -antiplatelet therapy: N/A 3. Pain Management: Has been using oxycodone  prn past 3 days? For foot, leg and back pain- also has Gabapentin  800mg  TID (increased 4/25) - duloxetine  stopped initially due to myositis- 30 mg daily resumed 4/23 -07/23/23 feet very sensitive, but cymbalta  and gabapentin  recently adjusted, lidocaine  cream not enough so asked that pharmacy send 15g tubes per treatment, to adequately cover the plantar surfaces of her feet to see if we can get relief.   4-28: Remains very sensitive, does feel some improvement 4-29: Patient with some daytime lethargy, pain primarily at night.  Reduce daytime gabapentin  to 600 mg every morning/600 mg q. afternoon, increase nighttime dose to 1000 mg.  Increase duloxetine  to 30 mg twice daily. 4-30: Patient did much better overnight with the above changes. 5-1: Discussed with patient further weaning daytime gabapentin ; she feels good on current regimen and wishes to hold off at this time 5/5 add prn robaxin for muscle spams 5/6 tramadol PRN started 5/7 patient reports pain controlled today other than mild headache improving with Tylenol .  Continue to monitor  4. Mood/Behavior/Sleep: LCSW  to follow for evaluation and support.              -antipsychotic agents: N/A  -Neuropsych consult  -07/23/23 didn't sleep well, has melatonin PRN to use - 4-28: Sleep interrupted by pain as above.  Increasing gabapentin  and duloxetine ; may benefit from transition to Elavil nightly If no improvement 4-30: Sleeping better.  Thinks melatonin as needed is over sedating, advised that this is per her request.  5. Neuropsych/cognition: This patient is capable of making decisions on her own behalf. - 4-28: Dr. Cheryll Corti evaluated today; appreciate his professional assessment  6. Skin/Wound Care: Routine pressure relief measures.   - Chest port appears clean, routine dressing changes  7. Fluids/Electrolytes/Nutrition: Monitor I/O. Continue Ensure supplements and vitamin supplementation -07/22/23 hypoK+ 3.4, KCL 40meq po x 1- recheck 4/28 -07/23/23 added labs for tomorrow (Mg/phos, CMP, CK) 4/28: Hypokalemia worsened to 2.9 status post 40 mill equivalents; add 40 mill equivalents KCl PO, +40 mill equivalents IV today.  Repeat BMP tonight and tomorrow a.m. Phos elevated, so we will avoid K-Phos--repeat potassium 3.4 at 6 PM. 4-29: Hypokalemia resolved.  Repeat labs Thursday. 5/1: Potassium back to 2.9.  DC HCTZ.  Add 40 mill equivalents  daily p.o. repletion, another 20 mill equivalents IV today.  Repeat labs in AM. 07/29/23 K 3.1 yesterday, will do 20mEq IV once today and continue the 40mEq PO, repeat labs Monday 5/5 K+ up to 3.3, increase to 30meq BID Continue BMP Monday Thursday  8. Non-specific myositis: Cymbalta  d/c by neurology due to concerns of rhabdomyolysis --Cymbalta  30 mg resumed 04/23 given improving myositis.  --Monitor CK intermittently improved from 1536-->514--> 398 --consider muscle biopsy if symptoms do not improve -07/23/23 CK to be done tomorrow and weekly on Mon 4-28: CK downtrending, continue to trend 5/1: CK has normalized.  9. Multifocal PNA: Likely aspiration--most significant in lingula and RLL on CT w/o contrast chest 07/18/23 --Hx of chronic intermittent N/V. May need swallow evaluation. Swallow precautions added.   --started on Augmentin  04/22-->04/25 for 5 day course.  -No apparent signs of respiratory distress  11. Multiple Vitamin deficiencies: Vitamin B1  250 mg daily 4/22-->4/28 followed by 100 mg daily IV.  --now on B12 IM+PO, IV folate. Vitamin A  10,000 units daily. Vitamin D  50,000/wk, and MVI --recheck Mg, Phos (supplemented)-- ordered for Monday 4/28--Phos improving, magnesium  stable  12. Resting tachycardia: HR has been 110-120 range at baseline --not on any BB -Start metoprolol  12.5 mg BID -07/23/23 still tachycardic but less, monitor  - intermittent, asymptomatic. Monitor 5/7 HR stable     08/02/2023    1:04 PM 08/02/2023    6:28 AM 08/02/2023    5:00 AM  Vitals with BMI  Weight   308 lbs 10 oz  BMI   45.56  Systolic 125 118   Diastolic 86 79   Pulse 89 92      13. Accelerated HTN/orthostatic hypotension: BP poorly controlled. Continue hydrochlorothiazide  12.5mg  daily and Avapro  150mg  daily -- addition of BB? -4/25 BB started as in #12 -07/23/23 BPs better, monitor; of note, pharmacy note states pt was on amlodipine  previously? Remain off for now, but note this for  d/c 4/28 : reduce HCTZ to 6.125 due to recurrent hypokalemia--BP stable 5/1: BUN elevated, blood pressure soft, hypokalemia as above; DC hydrochlorothiazide .  Got 500 cc IV fluid bolus this afternoon. 5-2: Remains low, irbesartan  reduced to 75 mg this AM; DC given ongoing symptomatic orthostasis, can consider resumption once BP comes up.  Will give 1  L IV fluids today and patient thinks she can tolerate TED hose now, so we will add this on.  Will get orthostatics after IV fluid. 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 Latanoprost  and alphagan  gtts             --IVIG 04/21- 04/24. Followed by Dr. Curley Double. -07/23/23 humira, retinA, and rhopressa  listed in pharmacy note as "resume as needed in CIR or at discharge"; clarify when/if these need to be restarted while here 4-29: Should be OK to resume Retin A and Rhopressa ; will inquire about bringing from home. -- will check with Optho about Cellcept and Humira resumption.  4-30: Family brought Rhopressa  and Humira in from home; pharmacy holding Humira until clarified by Optho.  Rhopressa  resumed. 5-1: Spoke with Dr. Mason Sole, continue to hold Humira and CellCept pending outpatient follow-up with him shortly after discharge.  15. Urinary retention: Purewick being used--monitor voiding with PVR/bladder scan. Toilet every 4 hours  -4/25 PVR 64, continue to monitor   - 4-28: No recent PVRs documented, patient denies incontinence, monitor  16. Chronic Asthma: Continue Dulera  BID and singulair  10mg   nightly, azelastin BID --Followed by Dr. Idolina Maker.   17. H/o chronic gastritis w/nausea and vomiting: D/c Ibuprofen . Continue Protonix  40 mg daily and Pepcid  40mg  nightly; was on reglan  at home, has PRN compazine  here. Carafate  1g BID.  -- Followed by [facility].  No complaints while at rehab  18. Abnormal LFTs: Normal liver parenchyma and GB sludge noted on abdominal ultrasound 07/16/23.  --Resolving. Recheck in am-pending - 07/23/23 LFTs a bit improved 4/26, repeat CMP tomorrow morning; of note, HepB SAb reactive, core Ab positive 4-28: LFTs looking better  19. Peripheral polyneuropathy: Cymbalta  resumed 04/23 as CK trending down. Will check weekly -- See #3 above; may benefit from transition to Lyrica or trial of Qutenza as outpatient.  Was establishing with Dr. Rayleen Cal  18. Anemia of chronic disease: Likely malabsorption w/ Low iron but elevated TIBC/Ferritin -07/23/23 Hgb 9.9 on Fri, repeat weekly on Mon/Thurs; iron studies done 4/21 showing low TIBC 172, iron 46, ferritin elevated 791.  -07/29/23 Hgb stable yesterday; monitor Recheck tomorrow   19. Morbid obesity: BMI 48. Followed by RD/Bariatric nutrition for wt loss. Plan for Bariatric surgery 08/2023.              --Now on aggressive vitamin supplementation as above  20. Hyponatremia: Na down to 132-->question due to IVIG 4/31-4/24.  --May see rise in LFTs again.  -4/25 recheck labs still pending, will ask nursing to call to check on this -07/23/23 Na 131 on 4/26, recheck tomorrow to see trend or need for supplementation  4/28: NA stable 132. 5/1: Na 134, stable>> 3.1 5/2; monitor Stable 136 NA on 5/7  21. Severe tricuspid regurgitation/grade 1 diastolic heart failure   - 4/29: add daily weights with diuretic adjustments--stable -5/2: Weights remain stable, no external edema with DC hydrochlorothiazide .  Continue to monitor closely. -07/30/23 wt down significantly today, wonder if it's inaccurate; monitor trend -5/5-7 wt back  to 140kg, continue to monitor [facility] Weights   07/31/23 0900 08/01/23 0512 08/02/23 0500  Weight: (!) 140 kg (!) 140 kg (!) 140 kg    22. Gout: continue allopurinol  100mg  QD; off colchicine  for now, resume as appropriate   23. Diarrhea/constipation: -07/23/23 pt now having runny stools, thinks it's the food; stop miralax  daily and change to PRN; monitor 4-28: Ongoing liquid stool; encourage p.o. fluids, replete electrolytes as above, and adding fibercon supplemen\t   - 4/29: No Bms today; slowing down. [place] - 4-30: Diarrhea followed by smears, KUB performed to ensure no overflow incontinence, was normal. 5/1: KUB normal, patient feels she is having adequate bowel movements.   Last bowel movement 5-2, adequate per patient  LBM 5/7 continue to monitor  24. Dysuria:  -07/29/23 pt stating some dysuria/difficulty with urination; ordered U/A -07/30/23 U/A not done yesterday d/t BMs everytime she urinated; will try urinal today; f/up on results  -4/5/5 U/A 5/3 with 21-50 WBC, many bacteria start kelfex for suspected UTI, culture added -4/6 symptoms improved, follow urine culture  25. Anxiety  -xanax 0.25mg  PRN  -5/7 Buspar daily started 5mg   LOS: 13 days A FACE TO FACE EVALUATION WAS PERFORMED  Lylia Sand 08/02/2023, 4:24 PM

## 2023-08-03 ENCOUNTER — Ambulatory Visit: Admitting: Dietician

## 2023-08-03 LAB — BASIC METABOLIC PANEL WITH GFR
Anion gap: 9 (ref 5–15)
BUN: 10 mg/dL (ref 6–20)
CO2: 16 mmol/L — ABNORMAL LOW (ref 22–32)
Calcium: 8.9 mg/dL (ref 8.9–10.3)
Chloride: 112 mmol/L — ABNORMAL HIGH (ref 98–111)
Creatinine, Ser: 0.54 mg/dL (ref 0.44–1.00)
GFR, Estimated: >60 mL/min (ref >60)
Glucose, Bld: 96 mg/dL (ref 70–99)
Potassium: 3.5 mmol/L (ref 3.5–5.1)
Sodium: 137 mmol/L (ref 135–145)

## 2023-08-03 LAB — CBC
HCT: 27.1 % — ABNORMAL LOW (ref 36.0–46.0)
Hemoglobin: 8.5 g/dL — ABNORMAL LOW (ref 12.0–15.0)
MCH: 29.2 pg (ref 26.0–34.0)
MCHC: 31.4 g/dL (ref 30.0–36.0)
MCV: 93.1 fL (ref 80.0–100.0)
Platelets: 331 10*3/uL (ref 150–400)
RBC: 2.91 MIL/uL — ABNORMAL LOW (ref 3.87–5.11)
RDW: 21.1 % — ABNORMAL HIGH (ref 11.5–15.5)
WBC: 3 10*3/uL — ABNORMAL LOW (ref 4.0–10.5)
nRBC: 0 % (ref 0.0–0.2)

## 2023-08-03 LAB — MAGNESIUM: Magnesium: 1.6 mg/dL — ABNORMAL LOW (ref 1.7–2.4)

## 2023-08-03 MED ORDER — MAGNESIUM SULFATE 2 GM/50ML IV SOLN
2.0000 g | Freq: Once | INTRAVENOUS | Status: AC
  Administered 2023-08-03: 2 g via INTRAVENOUS
  Filled 2023-08-03: qty 50

## 2023-08-03 NOTE — Progress Notes (Addendum)
 PROGRESS NOTE   Subjective/Complaints: Patient reports having shooting pain in her feet bilaterally last night, resolved this morning.  She reports feeling anxious because she is making good progress and doing better and feels like that means something bad is going to happen.  She reports she has been making progress with ambulation.  LBM yesterday   ROS: as per HPI. Denies CP, SOB, abd pain, N/V/D/C, or any other complaints at this time.   + burning pain both feet--improved + Insomnia, improved + Orthostatic hypotension- improved + dysuria- resolved + Anxiety- Continued  Objective:   No results found.   Recent Labs    08/03/23 0359  WBC 3.0*  HGB 8.5*  HCT 27.1*  PLT 331    Recent Labs    08/03/23 0359  NA 137  K 3.5  CL 112*  CO2 16*  GLUCOSE 96  BUN 10  CREATININE 0.54  CALCIUM  8.9    Intake/Output Summary (Last 24 hours) at 08/03/2023 0842 Last data filed at 08/02/2023 2300 Gross per 24 hour  Intake 450 ml  Output --  Net 450 ml        Physical Exam: Vital Signs Blood pressure 122/84, pulse 91, temperature 98.5 F (36.9 C), resp. rate 18, height 5\' 9"  (1.753 m), weight (!) 140 kg, last menstrual period 06/27/2023, SpO2 100%.    General: NAD HEENT: Head is normocephalic, atraumatic, MMM Neck: Supple without JVD or lymphadenopathy Heart: RRR Chest: CTA bilaterally without wheezes, rales, or rhonchi; no distress. Port R chest  Abdomen: Soft, non-tender, non-distended, bowel sounds positive. Extremities: No clubbing, cyanosis, Tr b/l edema.  Psych: A little anxious appearing  Skin: Clean and intact without signs of breakdown over exposed surfaces.  Right chest port C-D-I  Neuro:  Awake, alert, oriented x 4.  No apparent cognitive deficits. Strength antigravity 4- out of 5 bilateral upper extremities, 4-/5 bilateral hip flexors, 4 out of 5 distal lower extremities. Hypersensitivity to light  touch in bilateral feet/ankles.  Otherwise, sensation intact No abnormal tone noted   Assessment/Plan: 1. Functional deficits which require 3+ hours per day of interdisciplinary therapy in a comprehensive inpatient rehab setting. Physiatrist is providing close team supervision and 24 hour management of active medical problems listed below. Physiatrist and rehab team continue to assess barriers to discharge/monitor patient progress toward functional and medical goals  Care Tool:  Bathing    Body parts bathed by patient: Right arm, Left arm, Chest, Abdomen, Front perineal area, Face   Body parts bathed by helper: Buttocks, Right upper leg, Left upper leg, Right lower leg, Left lower leg     Bathing assist Assist Level: Moderate Assistance - Patient 50 - 74%     Upper Body Dressing/Undressing Upper body dressing   What is the patient wearing?: Pull over shirt    Upper body assist Assist Level: Supervision/Verbal cueing    Lower Body Dressing/Undressing Lower body dressing    Lower body dressing activity did not occur: Environmental limitations (no clothes available) What is the patient wearing?: Pants     Lower body assist Assist for lower body dressing: Maximal Assistance - Patient 25 - 49%     Toileting Toileting  Toileting assist Assist for toileting: Dependent - Patient 0%     Transfers Chair/bed transfer  Transfers assist     Chair/bed transfer assist level: Minimal Assistance - Patient > 75%     Locomotion Ambulation   Ambulation assist   Ambulation activity did not occur: Safety/medical concerns  Assist level: 2 helpers Assistive device: Walker-rolling Max distance: 175'   Walk 10 feet activity   Assist  Walk 10 feet activity did not occur: Safety/medical concerns  Assist level: Minimal Assistance - Patient > 75% Assistive device: Walker-rolling   Walk 50 feet activity   Assist Walk 50 feet with 2 turns activity did not occur:  Safety/medical concerns  Assist level: 2 helpers (+2 WC follow) Assistive device: Walker-rolling    Walk 150 feet activity   Assist Walk 150 feet activity did not occur: Safety/medical concerns  Assist level: 2 helpers Assistive device: Walker-rolling    Walk 10 feet on uneven surface  activity   Assist Walk 10 feet on uneven surfaces activity did not occur: Safety/medical concerns         Wheelchair     Assist Is the patient using a wheelchair?: No             Wheelchair 50 feet with 2 turns activity    Assist            Wheelchair 150 feet activity     Assist          Blood pressure 122/84, pulse 91, temperature 98.5 F (36.9 C), resp. rate 18, height 5\' 9"  (1.753 m), weight (!) 140 kg, last menstrual period 06/27/2023, SpO2 100%.  Medical Problem List and Plan: 1. 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 If unaccessed?             -ELOS/Goals: 2-4 weeks -  min A hopefully             -Continue CIR  - 4/29: Having help from mom/dad at home in discharge. Mod-Max A ADLs with bed-level LB. Biggest barriers are weakness and some self-limiting behaviors. Walked 15 feet with EVA walker. Regular walker STS today.  -Team conference  note completed -Expected DC was adjusted to 5/21  2.  Antithrombotics: -DVT/anticoagulation:  Pharmaceutical: Lovenox  70mg  daily             -antiplatelet therapy: N/A 3. Pain Management: Has been using oxycodone  prn past 3 days? For foot, leg and back pain- also has Gabapentin  800mg  TID (increased 4/25) - duloxetine  stopped initially due to myositis- 30 mg daily resumed 4/23 -07/23/23 feet very sensitive, but cymbalta  and gabapentin  recently adjusted, lidocaine  cream not enough so asked that pharmacy send 15g tubes  per treatment, to adequately cover the plantar surfaces of her feet to see if we can get relief.  4-28: Remains very sensitive, does feel some improvement 4-29: Patient with some daytime lethargy, pain primarily at night.  Reduce daytime gabapentin  to 600 mg every morning/600 mg q. afternoon, increase nighttime dose to 1000 mg.  Increase duloxetine  to 30 mg twice daily. 4-30: Patient did much better overnight with the above changes. 5-1: Discussed with patient further weaning daytime gabapentin ; she feels good on current regimen and wishes to hold off at this time 5/5 add prn robaxin for muscle spams 5/6 tramadol PRN started 5/7  patient reports pain controlled today other than mild headache improving with Tylenol .  Continue to monitor 5/8 Continue current pain regimen.  Do not want to increase gabapentin  due to concerns of sedation.  Could consider trying Lyrica  4. Mood/Behavior/Sleep: LCSW to follow for evaluation and support.              -antipsychotic agents: N/A  -Neuropsych consult  -07/23/23 didn't sleep well, has melatonin PRN to use - 4-28: Sleep interrupted by pain as above.  Increasing gabapentin  and duloxetine ; may benefit from transition to Elavil nightly If no improvement 4-30: Sleeping better.  Thinks melatonin as needed is over sedating, advised that this is per her request.  5. Neuropsych/cognition: This patient is capable of making decisions on her own behalf. - 4-28: Dr. Cheryll Corti evaluated today; appreciate his professional assessment  6. Skin/Wound Care: Routine pressure relief measures.   - Chest port appears clean, routine dressing changes  7. Fluids/Electrolytes/Nutrition: Monitor I/O. Continue Ensure supplements and vitamin supplementation -07/22/23 hypoK+ 3.4, KCL 40meq po x 1- recheck 4/28 -07/23/23 added labs for tomorrow (Mg/phos, CMP, CK) 4/28: Hypokalemia worsened to 2.9 status post 40 mill equivalents; add 40 mill equivalents KCl PO, +40 mill equivalents IV  today.  Repeat BMP tonight and tomorrow a.m. Phos elevated, so we will avoid K-Phos--repeat potassium 3.4 at 6 PM. 4-29: Hypokalemia resolved.  Repeat labs Thursday. 5/1: Potassium back to 2.9.  DC HCTZ.  Add 40 mill equivalents daily p.o. repletion, another 20 mill equivalents IV today.  Repeat labs in AM. 07/29/23 K 3.1 yesterday, will do 20mEq IV once today and continue the 40mEq PO, repeat labs Monday 5/5 K+ up to 3.3, increase to 30meq BID 5/8 K+ up to 3.5 continue supplment, MG low at 1.6  8. Non-specific myositis: Cymbalta  d/c by neurology due to concerns of rhabdomyolysis --Cymbalta  30 mg resumed 04/23 given improving myositis.  --Monitor CK intermittently improved from 1536-->514--> 398 --consider muscle biopsy if symptoms do not improve -07/23/23 CK to be done tomorrow and weekly on Mon 4-28: CK downtrending, continue to trend 5/1: CK has normalized.  9. Multifocal PNA: Likely aspiration--most significant in lingula and RLL on CT w/o contrast chest 07/18/23 --Hx of chronic intermittent N/V. May need swallow evaluation. Swallow precautions added.   --started on Augmentin  04/22-->04/25 for 5 day course.  -No apparent signs of respiratory distress  11. Multiple Vitamin deficiencies: Vitamin B1  250 mg daily 4/22-->4/28 followed by 100 mg daily IV.  --now on B12 IM+PO, IV folate. Vitamin A  10,000 units daily. Vitamin D  50,000/wk, and MVI --recheck Mg, Phos (supplemented)-- ordered for Monday 4/28--Phos improving, magnesium  stable -5/8 MG low, discussed pharm- IV 2g today recheck saturday  12. Resting tachycardia: HR has been 110-120 range at baseline --not on any BB -Start metoprolol  12.5 mg BID -07/23/23 still tachycardic but less, monitor  - intermittent, asymptomatic. Monitor      08/03/2023    5:47 AM 08/02/2023    8:02 PM 08/02/2023    1:04 PM  Vitals with BMI  Systolic 122 131 409  Diastolic 84 103 86  Pulse 91 95 89     13. Accelerated HTN/orthostatic hypotension: BP  poorly controlled. Continue hydrochlorothiazide  12.5mg  daily and Avapro  150mg  daily -- addition of BB? -4/25 BB started as in #12 -07/23/23 BPs better, monitor; of note, pharmacy note states pt was on amlodipine  previously? Remain off for now, but note this for d/c 4/28 : reduce HCTZ to 6.125 due to recurrent hypokalemia--BP stable 5/1: BUN  elevated, blood pressure soft, hypokalemia as above; DC hydrochlorothiazide .  Got 500 cc IV fluid bolus this afternoon. 5-2: Remains low, irbesartan  reduced to 75 mg this AM; DC given ongoing symptomatic orthostasis, can consider resumption once BP comes up.  Will give 1 L IV fluids today and patient thinks she can tolerate TED hose now, so we will add this on.  Will get orthostatics after IV fluid. 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 Latanoprost  and alphagan  gtts             --IVIG 04/21- 04/24. Followed by Dr. Curley Double. -07/23/23 humira, retinA, and rhopressa  listed in pharmacy note as "resume as needed in CIR or at discharge"; clarify when/if these need to be restarted while here 4-29: Should be OK to resume Retin A and Rhopressa ; will inquire about bringing from home. -- will check with Optho about Cellcept and Humira resumption.  4-30: Family brought Rhopressa  and Humira in from home; pharmacy holding Humira until clarified by Optho.  Rhopressa  resumed. 5-1: Spoke with Dr. Mason Sole, continue to hold Humira and CellCept pending outpatient follow-up with him shortly after discharge.  15. Urinary  retention: Purewick being used--monitor voiding with PVR/bladder scan. Toilet every 4 hours  -4/25 PVR 64, continue to monitor   - 4-28: No recent PVRs documented, patient denies incontinence, monitor  16. Chronic Asthma: Continue Dulera  BID and singulair  10mg  nightly, azelastin BID --Followed by Dr. Idolina Maker.   17. H/o chronic gastritis w/nausea and vomiting: D/c Ibuprofen . Continue Protonix  40 mg daily and Pepcid  40mg  nightly; was on reglan  at home, has PRN compazine  here. Carafate  1g BID.  -- Followed by [facility].  No complaints while at rehab  18. Abnormal LFTs: Normal liver parenchyma and GB sludge noted on abdominal ultrasound 07/16/23.  --Resolving. Recheck in am-pending - 07/23/23 LFTs a bit improved 4/26, repeat CMP tomorrow morning; of note, HepB SAb reactive, core Ab positive 4-28: LFTs looking better  19. Peripheral polyneuropathy: Cymbalta  resumed 04/23 as CK trending down. Will check weekly -- See #3 above; may benefit from transition to Lyrica or trial of Qutenza as outpatient.  Was establishing with Dr. Rayleen Cal  18. Anemia of chronic disease: Likely malabsorption w/ Low iron but elevated TIBC/Ferritin -07/23/23 Hgb 9.9 on Fri, repeat weekly on Mon/Thurs; iron studies done 4/21 showing low TIBC 172, iron 46, ferritin elevated 791.  -5/8 HGB overall stable 8.5  19. Morbid obesity: BMI 48. Followed by RD/Bariatric nutrition for wt loss. Plan for Bariatric surgery 08/2023.              --Now on aggressive vitamin supplementation as above  20. Hyponatremia: Na down to 132-->question due to IVIG 4/31-4/24.  --May see rise in LFTs again.  -4/25 recheck labs still pending, will ask nursing to call to check on this -07/23/23 Na 131 on 4/26, recheck tomorrow to see trend or need for supplementation  4/28: NA stable 132. 5/1: Na 134, stable>> 3.1 5/2; monitor  5/8 Stable 137  21. Severe tricuspid regurgitation/grade 1 diastolic  heart failure   - 4/29: add daily weights with  diuretic adjustments--stable -5/2: Weights remain stable, no external edema with DC hydrochlorothiazide .  Continue to monitor closely. -07/30/23 wt down significantly today, wonder if it's inaccurate; monitor trend -5/5-8 wt back to 140kg, continue to monitor Filed Weights   07/31/23 0900 08/01/23 0512 08/02/23 0500  Weight: (!) 140 kg (!) 140 kg (!) 140 kg    22. Gout: continue allopurinol  100mg  QD; off colchicine  for now, resume as appropriate   23. Diarrhea/constipation: -07/23/23 pt now having runny stools, thinks it's the food; stop miralax  daily and change to PRN; monitor 4-28: Ongoing liquid stool; encourage p.o. fluids, replete electrolytes as above, and adding fibercon supplemen\t   - 4/29: No Bms today; slowing down. [place] - 4-30: Diarrhea followed by smears, KUB performed to ensure no overflow incontinence, was normal. 5/1: KUB normal, patient feels she is having adequate bowel movements.   Last bowel movement 5-2, adequate per patient  LBM 5/7 continue to monitor  24. Dysuria:  -07/29/23 pt stating some dysuria/difficulty with urination; ordered U/A -07/30/23 U/A not done yesterday d/t BMs everytime she urinated; will try urinal today; f/up on results  -4/5/5 U/A 5/3 with 21-50 WBC, many bacteria start kelfex for suspected UTI, culture added -4/6 symptoms improved, follow urine culture  25. Anxiety  -xanax 0.25mg  PRN  -5/7 Buspar daily started 5mg - monitor response  LOS: 14 days A FACE TO FACE EVALUATION WAS PERFORMED  Lylia Sand 08/03/2023, 8:42 AM

## 2023-08-03 NOTE — Group Note (Signed)
 Patient Details Name: Tiffany Velasquez MRN: [id number] DOB: [birth year] Today's Date: 08/03/2023  GOAL:  Pt will participate in 60 minutes UEG group with an emphasis on activity tolerance, UE strength and socialization.  MET   Group Description: BUE Therex Group: Pt participated in group session with a focus on BUE strength and endurance to facilitate improved activity tolerance and strength for higher level BADLs and functional mobility tasks.      Individual level documentation: Pt first in engaged in seated warm before transitioning to seated UE exercise.  Began session with UE exercise where group members shared information about themselves to allow participants to get to know one another.  Played dice game completing UE exercise including   seated bicep curls, shoulder flexion/extension, forward punches, and upright rows with supervision using 1 lb hand weights.  Pt did require rest breaks throughout the session due to fatigue, needed min cues to take breaks. Ended session with guide deep breathing/relaxation training. Discussed benefits of deep breathing to manage stress and pain.    Pain:c/o of intermittent R shoulder pain, rest assisted with relief Pain Assessment Pain Scale: 0-10 Pain Score: Asleep Pain Location: Foot Pain Intervention(s): Medication (See eMAR)      Dorenda Pfannenstiel 08/03/2023, 2:17 PM

## 2023-08-03 NOTE — Progress Notes (Signed)
 Occupational Therapy Session Note  Patient Details  Name: Tiffany Velasquez MRN: [id number] Date of Birth: Dec 11, 1995  Today's Date: 08/03/2023 OT Individual Time: 3474-2595 OT Individual Time Calculation (min): 55 min    Short Term Goals: Week 2:  OT Short Term Goal 1 (Week 2): Pt will hike LB garments with Min A + LRAD. OT Short Term Goal 2 (Week 2): Pt will perform 1/3 toileting activities with Min A + LRAD. OT Short Term Goal 3 (Week 2): Pt will perform toilet transfer with consistent Min A + LRAD.  Skilled Therapeutic Interventions/Progress Updates:  Pt received resting in bed, no complaints of pain, agreeable to ADL session at sponge-bathing level. Pt performs sit<>stands during session with CGA-light Min A + RW. Ambulatory transfer from EOB>TIS WC at sink-side with CGA + RW. Sponge-bathing/dressing with setup/supervision for UB and Min A for LB to assist with thoroughness of posterior pericare and complete hiking of garments. Pt able to stand while performing the above LB care with CGA + unilateral support on RW/sink-edge. Pt uses long-handled sponge to bathe BLE. Pt requires Min A for footwear management.   BP assessed after standing care, reading at 149/113 and HR at 125, therefore TEDs discontinued this session. Ambulation from room>day room with CGA + RW, self directed standing break.   Pt remained sitting in TIS WC, awaiting start of group therapy session.   Therapy Documentation Precautions:  Precautions Precautions: Fall Recall of Precautions/Restrictions: Intact Precaution/Restrictions Comments: 6 falls recently Restrictions Weight Bearing Restrictions Per Provider Order: No   Therapy/Group: Individual Therapy  Artemus Biles, OTR/L, MSOT  08/03/2023, 6:17 AM

## 2023-08-03 NOTE — Group Note (Signed)
 Patient Details Name: Tiffany Velasquez MRN: [id number] DOB: [birth year] Today's Date: 08/03/2023  Time Calculation: OT Group Time Calculation OT Group Start Time: 1102 OT Group Stop Time: 1202 OT Group Time Calculation (min): 60 min      Group Description: BUE Therex Group: Pt participated in group session with a focus on BUE strength and endurance to facilitate improved activity tolerance and strength for higher level BADLs and functional mobility tasks.    Individual level documentation:  Pt first in engaged in ball tosses around the room to other group members to facilitate improved BUE coordination, strength and cardiovascular endurance for ~ 6 mins as a light warm up  Pt engaged in seated therapeutic activity game where pts were instructed to roll large dice to see what number they rolled, once a number was determined each number correlated to an UB exercise and a number of reps, exercises included bicep curls, upright rows, flys, chest presses and punches. Repetitions ranged from 10-20. Pt chose to use 1 lb weights during session. Eduction provided during activity of various modifications for all exercises. Education provided on the importance of deep breathing as well as determining each pts activity tolerance.   Pts were issued HEP with level 2 theraband with all group members guided through BUE HEP with exercises including:  - Seated Shoulder Horizontal Abduction with Resistance  - 1 x daily - 7 x weekly - 3 sets - 10 reps - Seated Shoulder Extension with Self-Anchored Resistance  - 1 x daily - 7 x weekly - 3 sets - 10 reps - Seated Elbow Flexion with Self-Anchored Resistance  - 1 x daily - 7 x weekly - 3 sets - 10 reps - Alternating Punch with Resistance  - 1 x daily - 7 x weekly - 3 sets - 10 reps    Ended session with guide deep breathing/body scan for 3 mins. Discussed benefits of deep breathing to manage stress and pain. Pt transported back to room by RT.   Pain: Unrated pain  reported in R shoulder, rest breaks provided as needed.    Mollie Anger [facility] 08/03/2023, 1:02 PM

## 2023-08-03 NOTE — Plan of Care (Signed)
  Problem: Consults Goal: RH GENERAL PATIENT EDUCATION Description: See Patient Education module for education specifics. Outcome: Progressing   Problem: RH BOWEL ELIMINATION Goal: RH STG MANAGE BOWEL WITH ASSISTANCE Description: STG Manage Bowel with supervision Assistance. Outcome: Progressing   Problem: RH BLADDER ELIMINATION Goal: RH STG MANAGE BLADDER WITH ASSISTANCE Description: STG Manage Bladder With supervision Assistance Outcome: Progressing   Problem: RH SKIN INTEGRITY Goal: RH STG SKIN FREE OF INFECTION/BREAKDOWN Description: Manage skin free of infection/breakdown with supervision Outcome: Progressing   Problem: RH SAFETY Goal: RH STG ADHERE TO SAFETY PRECAUTIONS W/ASSISTANCE/DEVICE Description: STG Adhere to Safety Precautions With supervision  Assistance/Device. Outcome: Progressing   Problem: RH PAIN MANAGEMENT Goal: RH STG PAIN MANAGED AT OR BELOW PT'S PAIN GOAL Description: <4 w/ prns Outcome: Progressing

## 2023-08-03 NOTE — Progress Notes (Signed)
 Physical Therapy Session Note  Patient Details  Name: Tiffany Velasquez MRN: [id number] Date of Birth: 14-Oct-1995  Today's Date: 08/03/2023 PT Individual Time: 1420-1530 PT Individual Time Calculation (min): 70 min   Short Term Goals: Week 2:  PT Short Term Goal 1 (Week 2): Pt will ambulate with RW 100' with min assist PT Short Term Goal 2 (Week 2): Pt will completes sit<>stands with min assist consistently PT Short Term Goal 3 (Week 2): Pt will complete up/down 4 steps with BHRs min assist  Skilled Therapeutic Interventions/Progress Updates: Pt presented in bed agreeable to therapy. Pt denies pain during session. Session focused on functional mobility and activity tolerance. Pt completed bed mobility with supervision and use of bed rail. BP monitored as OT informed this therapist that pt without TED hose. BP as noted below with pt asymptomatic throughout session. Pt performed Sit to stand throughout session from elevated surfaces with CGA. After BP check pt stood and ambulated ~138ft with x 1 extended rest break ~19ft. Pt noted to ambulate with increased truncal sway and increased thrust of LEs. After extended seated rest break PTA obtained 4lb weights and placed on BLE for increased proprioceptive feedback. Pt then ambulated ~42ft with improved BLE control. Pt then ambulated an additional 38ft without weights with some carryover. PTA explained to pt may continue to perform intermittently for increased Biofeedback with pt verbalizing understanding. Pt then participated in standing activities including alternating toe taps to 4in steps x 10, ball taps with beach ball initially with single UE support progressing to no UE support. Pt also participated in catch with beach ball for dynamic balance. Participated in x 2 rounds of horseshoes for dynamic balance with pt reaching outside BOS for moderate challenge and encouraging leans. Pt able to tolerate standing bouts up to 2 min with no indication of  lightheadedness throughout. After last bout of horseshoes pt ambulated back to TIS without HHA and CGA. Pt transported back to room and completed stand step transfer without AD back to bed. Pt left seated EOB at end of session with call bell within reach and family present.   BP sitting 123/91 (100) HR 91 BP standing 108/85 (94) HR 122     Therapy Documentation Precautions:  Precautions Precautions: Fall Recall of Precautions/Restrictions: Intact Precaution/Restrictions Comments: 6 falls recently Restrictions Weight Bearing Restrictions Per Provider Order: No General:   Vital Signs: Therapy Vitals Temp: 97.6 F (36.4 C) Temp Source: Oral Pulse Rate: 74 Resp: 17 BP: 110/76 Patient Position (if appropriate): Lying Oxygen Therapy SpO2: 100 % O2 Device: Room Air Pain: Pain Assessment Pain Scale: 0-10 Pain Score: Asleep Pain Location: Foot Pain Intervention(s): Medication (See eMAR)  Therapy/Group: Individual Therapy  Theadore Blunck 08/03/2023, 4:21 PM

## 2023-08-04 ENCOUNTER — Ambulatory Visit: Payer: Medicare HMO | Admitting: Gastroenterology

## 2023-08-04 ENCOUNTER — Encounter: Admitting: Neurology

## 2023-08-04 MED ORDER — AMOXICILLIN-POT CLAVULANATE 500-125 MG PO TABS
1.0000 | ORAL_TABLET | Freq: Two times a day (BID) | ORAL | Status: AC
  Administered 2023-08-04 – 2023-08-09 (×10): 1 via ORAL
  Filled 2023-08-04 (×11): qty 1

## 2023-08-04 MED ORDER — HEPARIN SOD (PORK) LOCK FLUSH 100 UNIT/ML IV SOLN
500.0000 [IU] | INTRAVENOUS | Status: DC | PRN
  Administered 2023-08-04 – 2023-08-11 (×2): 500 [IU]
  Filled 2023-08-04 (×3): qty 5

## 2023-08-04 MED ORDER — HEPARIN SOD (PORK) LOCK FLUSH 100 UNIT/ML IV SOLN
500.0000 [IU] | INTRAVENOUS | Status: DC
  Filled 2023-08-04: qty 5

## 2023-08-04 NOTE — Progress Notes (Signed)
 Occupational Therapy Weekly Progress Note  Patient Details  Name: Tiffany Velasquez MRN: [id number] Date of Birth: [birth year]  Beginning of progress report period: Jul 28, 2023 End of progress report period: Aug 04, 2023   Patient has met 3 of 3 short term goals this reporting period, having made significant progress in functional abilities. Pt completes UB ADLs with setup/supervision, managing LB care with Min A in standing with CGA-close supervision + AD. Pt's discharge date extended due to improvement in functional performance, caregiver education to be schedule closer to new discharge date.   Patient continues to demonstrate the following deficits: muscle weakness, decreased cardiorespiratoy endurance, decreased coordination, and decreased standing balance, decreased postural control, and decreased balance strategies and therefore will continue to benefit from skilled OT intervention to enhance overall performance with BADL, iADL, and Reduce care partner burden.  Patient goals upgraded to overall supervision level.  Plan of care revisions: 5/9.  OT Short Term Goals Week 2:  OT Short Term Goal 1 (Week 2): Pt will hike LB garments with Min A + LRAD. OT Short Term Goal 1 - Progress (Week 2): Met OT Short Term Goal 2 (Week 2): Pt will perform 1/3 toileting activities with Min A + LRAD. OT Short Term Goal 2 - Progress (Week 2): Met OT Short Term Goal 3 (Week 2): Pt will perform toilet transfer with consistent Min A + LRAD. OT Short Term Goal 3 - Progress (Week 2): Met Week 3:  OT Short Term Goal 1 (Week 3): Pt will perform 3/3 toileting tasks with CGA + LRAD. OT Short Term Goal 2 (Week 3): Pt will bathe LB with CGA + LRAD. OT Short Term Goal 3 (Week 3): Pt will perform tub/shower transfer with CGA + LRAD.   Therapy Documentation Precautions:  Precautions Precautions: Fall Recall of Precautions/Restrictions: Intact Precaution/Restrictions Comments: 6 falls recently Restrictions Weight  Bearing Restrictions Per Provider Order: No   Therapy/Group: Individual Therapy  Artemus Biles, OTR/L, MSOT  08/04/2023, 7:54 AM

## 2023-08-04 NOTE — Plan of Care (Signed)
 Goals upgraded 5/9 due to improvements in functional performance.   Problem: RH Balance Goal: LTG Patient will maintain dynamic standing with ADLs (OT) Description: LTG:  Patient will maintain dynamic standing balance with assist during activities of daily living (OT)  Flowsheets (Taken 08/04/2023 0759) LTG: Pt will maintain dynamic standing balance during ADLs with: Supervision/Verbal cueing   Problem: Sit to Stand Goal: LTG:  Patient will perform sit to stand in prep for activites of daily living with assistance level (OT) Description: LTG:  Patient will perform sit to stand in prep for activites of daily living with assistance level (OT) Flowsheets (Taken 08/04/2023 0759) LTG: PT will perform sit to stand in prep for activites of daily living with assistance level: Supervision/Verbal cueing   Problem: RH Grooming Goal: LTG Patient will perform grooming w/assist,cues/equip (OT) Description: LTG: Patient will perform grooming with assist, with/without cues using equipment (OT) Flowsheets (Taken 08/04/2023 0759) LTG: Pt will perform grooming with assistance level of: Independent with assistive device    Problem: RH Bathing Goal: LTG Patient will bathe all body parts with assist levels (OT) Description: LTG: Patient will bathe all body parts with assist levels (OT) Flowsheets (Taken 08/04/2023 0759) LTG: Pt will perform bathing with assistance level/cueing: Set up assist    Problem: RH Dressing Goal: LTG Patient will perform upper body dressing (OT) Description: LTG Patient will perform upper body dressing with assist, with/without cues (OT). Flowsheets (Taken 08/04/2023 0759) LTG: Pt will perform upper body dressing with assistance level of: Independent with assistive device Goal: LTG Patient will perform lower body dressing w/assist (OT) Description: LTG: Patient will perform lower body dressing with assist, with/without cues in positioning using equipment (OT) Flowsheets (Taken 08/04/2023  0759) LTG: Pt will perform lower body dressing with assistance level of: Supervision/Verbal cueing   Problem: RH Toileting Goal: LTG Patient will perform toileting task (3/3 steps) with assistance level (OT) Description: LTG: Patient will perform toileting task (3/3 steps) with assistance level (OT)  Flowsheets (Taken 08/04/2023 0759) LTG: Pt will perform toileting task (3/3 steps) with assistance level: Supervision/Verbal cueing   Problem: RH Toilet Transfers Goal: LTG Patient will perform toilet transfers w/assist (OT) Description: LTG: Patient will perform toilet transfers with assist, with/without cues using equipment (OT) Flowsheets (Taken 08/04/2023 0759) LTG: Pt will perform toilet transfers with assistance level of: Supervision/Verbal cueing

## 2023-08-04 NOTE — Progress Notes (Signed)
 Physical Therapy Session Note  Patient Details  Name: Tiffany Velasquez MRN: [id number] Date of Birth: [birth year]  Today's Date: 08/04/2023 PT Individual Time: 0800-0845 PT Individual Time Calculation (min): 45 min    Short Term Goals: Week 2:  PT Short Term Goal 1 (Week 2): Pt will ambulate with RW 100' with min assist PT Short Term Goal 2 (Week 2): Pt will completes sit<>stands with min assist consistently PT Short Term Goal 3 (Week 2): Pt will complete up/down 4 steps with BHRs min assist  Skilled Therapeutic Interventions/Progress Updates: Pt presented sitting EOB agreeable to therapy. Pt states some mild soreness near ankle where weights were placed yesterday then explained more like ms soreness. BP assessed in sitting as noted below. Pt stood and CGA and ambulated to day room 167ft with RW and CGA. Pt then participated in standing balance/endurance activities including throwing bounce ball x 2 min, ambulating over hurdles, and performing Sit to stad from lower surface without AD. During ball toss pt able to reach for ball with more moderate challenges without LOB. BP checked after activities as noted below. Pt then participated in several rounds of corn hole in standing with pt initially grabbing 1 then multiple bags. Pt then ambulated back to room at end of sessio 116ft with RW and CGA. Pt left in TIS at end of session as NT in room changing bed sheets and agreeable to transfer back to bed once completed.      Therapy Documentation Precautions:  Precautions Precautions: Fall Recall of Precautions/Restrictions: Intact Precaution/Restrictions Comments: 6 falls recently Restrictions Weight Bearing Restrictions Per Provider Order: No General:   Vital Signs: Therapy Vitals Temp: 98.1 F (36.7 C) Pulse Rate: (!) 101 Resp: 19 BP: (!) 129/98 Patient Position (if appropriate): (P) Sitting Oxygen Therapy SpO2: 100 % O2 Device: Room Air    Therapy/Group: Individual Therapy  [name] 08/04/2023, 10:28 AM
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
## 2023-08-04 NOTE — Progress Notes (Addendum)
 Nutrition Consult Diet Education  RD received consult for diet education. Patient is being followed by outpatient RD for weight loss and she has successfully lost 170 lbs per review of weight history over the past year. During acute hospitalization, she was found to be deficient in multiple vitamins/minerals (see below) and has received repletion with B-12 injections, IV folate, IV thiamine , and vitamin D  supplementation. She remains on vitamin B-12 PO, folic acid  PO, MVI with minerals, Klor-Con , IV thiamine , vitamin A  PO, vitamin D  PO. Since admission to rehab, she has been drinking Ensure Max supplements BID and taking Prosource Plus supplement once daily. She likes the Ensure supplements and wants to continue them to make sure she is getting enough protein.   Vitamin Panel (April 2025-during acute hospitalization): Folate 4 (L) Ferritin 791 (H) TIBC 172 (L) Iron 46 (WNL) Copper  125 (WNL) Thiamine  49.9 (L) Vitamin D , 25-Hydroxy 6.17 (L) Vitamin A  10 (L) Vitamin B12 449 (WNL) Vitamin E (Alpha Tocopherol) 7.8 (WNL) Vitamin E (Gamma Tocopherol) 3.4 (WNL)  Patient is currently on a regular diet, consuming 50-100% of meals.  Labs and medications reviewed.  Intervention: Continue outpatient weight loss counseling after discharge home. Continue Ensure Max BID. Continue Prosource Plus daily. Continue MVI, B-12, folic acid , thiamine , vitamin A , and vitamin D .   Barnet Boots RD, LDN, CNSC Contact via secure chat. If unavailable, use group chat "RD Inpatient."

## 2023-08-04 NOTE — Progress Notes (Deleted)
 HPI :  28 y.o. female with a past medical history of hypertension, asthma, morbid obesity, eczema, glaucoma and others listed below presents for evaluation of ER follow-up.    Patient is interested in bariatric surgery. 04/04/23 EGD with Junie Olds, MD that showed nodular gastritis. 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 pylori negative metaplasia and dysplasia  03/20/2023 labs reviewed show no anemia Hgb 12.3 but she did have a microcytosis of 74 normal platelets, iron 70, iron saturation 25, folate 4.8, potassium 3.3 normal kidney function AST 58, ALT 31, alk phos 51. 04/17/2023 CT unremarkable.   Patient was seen 04/12/2023 for gastritis with nausea and vomiting epigastric discomfort and burping had EGD/showed negative H. pylori gastritis started on Dexilant , Carafate  and Zofran  also told to try Salonpas patches for discomfort thought likely musculoskeletal from vomiting.   Discussed the use of AI scribe software for clinical note transcription with the patient, who gave verbal consent to proceed.   History of Present Illness   Tiffany Velasquez is a 28 year old female who presents with nausea, vomiting, and abdominal pain.   She experiences persistent nausea and vomiting approximately twice a week, which is an improvement from her previous state. The episodes are not associated with food intake and often occur in the morning. Vomiting starts with spitting and progresses to a single episode, which is less severe than before. No vomiting of food is noted, and the episodes are brief.   Abdominal pain occurs particularly when engaging abdominal muscles, such as when lifting her head. The pain is described as muscular and is not relieved by lidocaine  patches. A heating pad provides intermittent relief. She also reports a sensation of heaviness in her abdomen when walking for extended periods, despite ongoing weight loss and satisfactory eating habits.   A CT scan performed in January  showed no abnormalities in the pancreas, liver, gallbladder, spleen, or bowel. An endoscopy revealed inflammation, which is being treated with her current medication regimen.   Her current medications include famotidine , Dexilant , Zofran , Carafate , and Miralax , which she takes twice daily to maintain regular bowel movements. She recalls using metoclopramide  in the past, which she believes helped with nausea and bowel movement.   No association of nausea and vomiting with food intake, particularly fatty foods. Occasional back pain is noted but not consistently associated with food intake or nausea. No significant bloating or discomfort after eating, except when she feels she has overeaten.   RELEVANT GI HISTORY, LABS, IMAGING: 04/17/2023 CT AB and pelvis with contrast IMPRESSION: 1. Patchy consolidation within the left lower lobe, which may reflect hypoventilatory change or focal pneumonia. 2. No acute intra-abdominal or intrapelvic process. No focal liver abnormality seen, no gallstones or biliary dilation unremarkable pancreas and spleen no bowel abnormalities.   EGD 04/04/2023    - Normal esophagus.   - Gastroesophageal flap valve classified as Hill     Grade I (prominent fold, tight to endoscope).    - Nodular gastritis. Biopsied.    - Normal.   ASSESSMENT AND PLAN:  Abdominal Pain   Pain localized to the epigastric region, exacerbated by engaging abdominal muscles. Likely muscular or nerve-related, possibly due to an entrapped anterior nerve.   -Refer to Dr. General Kenner for AB wall injections to manage pain.     Nausea and Vomiting   Occurring twice weekly, improved from initial presentation.  -Continue Dexilant , famotidine , and Carafate  as needed.   -Schedule a gastric emptying study to assess for possible gastroparesis.   -  consider HIDA but not worse with fatty foods, no RUQ pain   Medication Refill   She requires a refill of current medications.   -Refill current medications  as requested.     Agree with assessment and plan as outlined.  Agree with workup for her nausea and vomiting. If she has a very focal area that is tender and abdominal wall pain / nerve entrapment then we can consider trigger point injection. However, sounds like her abdomen is diffusely tender from vomiting, I don't think that would be amenable to a trigger point injection, treatment is to control her vomiting, but happy to see her in follow up to reassess for this.     GES not done  RUQ US  07/21/23: IMPRESSION: Gallbladder sludge. 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     Past Surgical History:  Procedure Laterality Date   BIOPSY  04/04/2023   Procedure: BIOPSY;  Surgeon: Junie Olds, MD;  Location: [facility];  Service: General;;   CATARACT EXTRACTION     ESOPHAGOGASTRODUODENOSCOPY N/A 04/04/2023   Procedure: ESOPHAGOGASTRODUODENOSCOPY (EGD);  Surgeon: Junie Olds, MD;  Location: [facility];  Service: General;  Laterality: N/A;   PORTA CATH INSERTION     Family History  Problem Relation Age of Onset   Asthma Mother    Allergic rhinitis Father    Sudden death Cousin    Eczema Neg Hx    Urticaria Neg Hx    Social History   Tobacco Use   Smoking status: Never    Passive exposure: Never   Smokeless tobacco: Never  Vaping Use   Vaping status: Never Used  Substance Use Topics   Alcohol use: Yes    Comment: rarely   Drug use: No   No current facility-administered medications for this visit.   No current outpatient medications on file.   Facility-Administered Medications Ordered in Other Visits  Medication Dose Route Frequency Provider Last Rate Last Admin   (feeding supplement) PROSource Plus liquid 30 mL  30 mL Oral BID BM Love,  Pamela S, PA-C   30 mL at 08/04/23 0946   acetaminophen  (TYLENOL ) tablet 325-650 mg  325-650 mg Oral Q4H PRN Love, Pamela S, PA-C   650 mg at 08/03/23 1240   acetaZOLAMIDE  (DIAMOX ) tablet 500 mg  500 mg Oral BID Love, Pamela S, PA-C   500 mg at 08/04/23 0945   albuterol  (PROVENTIL ) (2.5 MG/3ML) 0.083% nebulizer solution 2.5 mg  2.5 mg Nebulization Q6H PRN Love, Pamela S, PA-C       allopurinol  (ZYLOPRIM ) tablet 100 mg  100 mg Oral Daily Love, Pamela S, PA-C   100 mg at 08/04/23 2536   ALPRAZolam  (XANAX ) tablet 0.25 mg  0.25 mg Oral BID PRN Lylia Sand, MD   0.25 mg at 08/04/23 1006   alum & mag hydroxide-simeth (MAALOX/MYLANTA) 200-200-20 MG/5ML suspension 30 mL  30 mL Oral Q4H PRN Love, Pamela S, PA-C       azelastine  (ASTELIN ) 0.1 % nasal spray 2 spray  2 spray Each Nare BID Zelda Hickman, PA-C   2 spray at 08/04/23 6440   bisacodyl  (DULCOLAX) suppository 10 mg  10 mg Rectal Daily PRN Love, Pamela S, PA-C       brimonidine  (  ALPHAGAN ) 0.2 % ophthalmic solution 1 drop  1 drop Both Eyes TID Love, Pamela S, PA-C   1 drop at 08/04/23 4540   busPIRone  (BUSPAR ) tablet 5 mg  5 mg Oral TID Love, Pamela S, PA-C   5 mg at 08/04/23 0946   cephALEXin  (KEFLEX ) capsule 250 mg  250 mg Oral Q6H Lylia Sand, MD   250 mg at 08/04/23 1114   Chlorhexidine  Gluconate Cloth 2 % PADS 6 each  6 each Topical BID Engler, Morgan C, DO   6 each at 08/04/23 0522   cyanocobalamin  (VITAMIN B12) tablet 1,000 mcg  1,000 mcg Oral Daily Love, Pamela S, PA-C   1,000 mcg at 08/04/23 0945   diphenhydrAMINE  (BENADRYL ) capsule 25 mg  25 mg Oral Q6H PRN Love, Pamela S, PA-C   25 mg at 07/23/23 2130   dorzolamide -timolol  (COSOPT ) 2-0.5 % ophthalmic solution 1 drop  1 drop Both Eyes BID Love, Pamela S, PA-C   1 drop at 08/04/23 9811   DULoxetine  (CYMBALTA ) DR capsule 30 mg  30 mg Oral BID Engler, Morgan C, DO   30 mg at 08/04/23 0945   enoxaparin  (LOVENOX ) injection 70 mg  70 mg Subcutaneous Q24H Love, Pamela S, PA-C   70 mg at  08/03/23 9147   famotidine  (PEPCID ) tablet 40 mg  40 mg Oral QHS Love, Pamela S, PA-C   40 mg at 08/03/23 2209   folic acid  (FOLVITE ) tablet 1 mg  1 mg Oral Daily Pham, Minh Q, RPH-CPP   1 mg at 08/04/23 0945   gabapentin  (NEURONTIN ) capsule 600 mg  600 mg Oral BID Engler, Morgan C, DO   600 mg at 08/04/23 8295   And   gabapentin  (NEURONTIN ) capsule 1,000 mg  1,000 mg Oral QHS Engler, Morgan C, DO   1,000 mg at 08/03/23 2207   guaiFENesin -dextromethorphan (ROBITUSSIN DM) 100-10 MG/5ML syrup 5-10 mL  5-10 mL Oral Q6H PRN Love, Pamela S, PA-C       heparin  lock flush 100 unit/mL  500 Units Intracatheter Q30 days Cherri Corns C, DO       And   heparin  lock flush 100 unit/mL  500 Units Intracatheter PRN Cherri Corns C, DO   500 Units at 08/04/23 0859   latanoprost  (XALATAN ) 0.005 % ophthalmic solution 1 drop  1 drop Both Eyes QHS Zelda Hickman, PA-C   1 drop at 08/03/23 2214   lidocaine  (LMX) 4 % cream   Topical TID Engler, Morgan C, DO   1 Application at 08/04/23 6213   lidocaine  (XYLOCAINE ) 2 % jelly   Topical PRN Love, Pamela S, PA-C       melatonin tablet 3 mg  3 mg Oral QHS PRN Love, Pamela S, PA-C   3 mg at 08/02/23 2259   methocarbamol  (ROBAXIN ) tablet 500 mg  500 mg Oral Q8H PRN Lylia Sand, MD   500 mg at 08/02/23 2259   metoprolol  tartrate (LOPRESSOR ) tablet 12.5 mg  12.5 mg Oral BID Love, Pamela S, PA-C   12.5 mg at 08/04/23 0945   mometasone -formoterol  (DULERA ) 200-5 MCG/ACT inhaler 2 puff  2 puff Inhalation BID Zelda Hickman, PA-C   2 puff at 08/04/23 1001   montelukast  (SINGULAIR ) tablet 10 mg  10 mg Oral QHS Love, Pamela S, PA-C   10 mg at 08/03/23 2209   multivitamin with minerals tablet 1 tablet  1 tablet Oral Daily Zelda Hickman, PA-C   1 tablet at 08/04/23 0946   Netarsudil  Dimesylate 0.02 %  SOLN 1 drop  1 drop Right Eye QHS Zelda Hickman, PA-C   1 drop at 08/03/23 2218   nutrition supplement (JUVEN) (JUVEN) powder packet 1 packet  1 packet Oral BID BM Love, Pamela S,  PA-C   1 packet at 08/03/23 1610   pantoprazole  (PROTONIX ) EC tablet 40 mg  40 mg Oral [id number] Love, Pamela S, PA-C   40 mg at 08/04/23 0548   polycarbophil (FIBERCON) tablet 625 mg  625 mg Oral Daily Engler, Morgan C, DO   625 mg at 08/04/23 0945   polyethylene glycol (MIRALAX  / GLYCOLAX ) packet 17 g  17 g Oral Daily PRN Street, [place], PA-C       potassium chloride  (KLOR-CON  M) CR tablet 30 mEq  30 mEq Oral BID Lylia Sand, MD   30 mEq at 08/04/23 0945   prochlorperazine  (COMPAZINE ) tablet 5-10 mg  5-10 mg Oral Q6H PRN Love, Pamela S, PA-C       Or   prochlorperazine  (COMPAZINE ) suppository 12.5 mg  12.5 mg Rectal Q6H PRN Love, Pamela S, PA-C       Or   prochlorperazine  (COMPAZINE ) injection 5-10 mg  5-10 mg Intravenous Q6H PRN Love, Pamela S, PA-C       protein supplement (ENSURE MAX) liquid  11 oz Oral BID Love, Pamela S, PA-C   11 oz at 08/04/23 0957   sodium chloride  flush (NS) 0.9 % injection 10-40 mL  10-40 mL Intracatheter PRN Zelda Hickman, PA-C       sodium chloride  flush (NS) 0.9 % injection 10-40 mL  10-40 mL Intracatheter Q12H Love, Pamela S, PA-C   10 mL at 08/04/23 1002   sodium chloride  flush (NS) 0.9 % injection 10-40 mL  10-40 mL Intracatheter PRN Love, Pamela S, PA-C       sodium phosphate  (FLEET) enema 1 enema  1 enema Rectal Once PRN Love, Pamela S, PA-C       sucralfate  (CARAFATE ) 1 GM/10ML suspension 1 g  1 g Oral BID Love, Pamela S, PA-C   1 g at 08/04/23 0945   thiamine  (VITAMIN B1) injection 100 mg  100 mg Intravenous Daily Love, Pamela S, PA-C   100 mg at 08/04/23 0945   traMADol  (ULTRAM ) tablet 50 mg  50 mg Oral Q12H PRN Lylia Sand, MD       vitamin A  capsule 10,000 Units  10,000 Units Oral Daily Love, Pamela S, PA-C   10,000 Units at 08/04/23 0945   Vitamin D  (Ergocalciferol ) (DRISDOL ) 1.25 MG (50000 UNIT) capsule 50,000 Units  50,000 Units Oral Q7 days Love, Pamela S, PA-C   50,000 Units at 07/29/23 1815   Allergies  Allergen Reactions   Other  Itching, Rash and Swelling    Seafood,tomato paste, peanut  butter, peaches, oranges, apples Throat swelling  Dust mite, oak trees, grass- causes rash, itching   Peanut -Containing Drug Products Anaphylaxis   Shellfish Allergy  Anaphylaxis    Throat swelling    Apple Juice Rash   Orange Fruit [Citrus] Rash   Peach Flavoring Agent (Non-Screening) Rash   Tomato Rash, Hives and Itching   Tree Extract Rash     Review of Systems: All systems reviewed and negative except where noted in HPI.    DG Knee 1-2 Views Left Result Date: 07/28/2023 CLINICAL DATA:  Knee pain EXAM: LEFT KNEE - 2 VIEW COMPARISON:  None Available. FINDINGS: No evidence of fracture, dislocation, or joint effusion. No evidence of arthropathy or other focal bone abnormality. Soft tissues are  unremarkable. IMPRESSION: No acute osseous abnormality. Electronically Signed   By: Adrianna Horde M.D.   On: 07/28/2023 14:25   DG Abd 1 View Result Date: 07/26/2023 CLINICAL DATA:  Incontinence EXAM: ABDOMEN - 1 VIEW COMPARISON:  03/10/2005 FINDINGS: The bowel gas pattern is normal. No radio-opaque calculi or other significant radiographic abnormality are seen. IMPRESSION: No acute abnormality noted. Electronically Signed   By: Violeta Grey M.D.   On: 07/26/2023 10:15   CT CHEST WO CONTRAST Result Date: 07/18/2023 CLINICAL DATA:  Evaluate for pneumonia. EXAM: CT CHEST WITHOUT CONTRAST TECHNIQUE: Multidetector CT imaging of the chest was performed following the standard protocol without IV contrast. RADIATION DOSE REDUCTION: This exam was performed according to the departmental dose-optimization program which includes automated exposure control, adjustment of the mA and/or kV according to patient size and/or use of iterative reconstruction technique. COMPARISON:  None Available. FINDINGS: Cardiovascular: No significant vascular findings. Normal heart size. No pericardial effusion. Right-sided central venous catheter tip ends at the caval atrial  junction. Mediastinum/Nodes: No enlarged mediastinal or axillary lymph nodes. Thyroid  gland, trachea, and esophagus demonstrate no significant findings. Lungs/Pleura: There is a small left pleural effusion. Multifocal airspace and patchy ground-glass opacities are seen throughout both lungs most significant in the lingula and right lower lobe. Lung apices are spared. No evidence for pneumothorax. Upper Abdomen: No acute abnormality. Musculoskeletal: No chest wall mass or suspicious bone lesions identified. IMPRESSION: 1. Multifocal airspace and patchy ground-glass opacities throughout both lungs most significant in the lingula and right lower lobe compatible with multifocal pneumonia. 2. Small left pleural effusion. Electronically Signed   By: Tyron Gallon M.D.   On: 07/18/2023 18:28   ECHOCARDIOGRAM COMPLETE Result Date: 07/17/2023    ECHOCARDIOGRAM REPORT   Patient Name:   Tiffany Velasquez Date of Exam: 07/17/2023 Medical Rec #:  [id number]      Height:       69.0 in Accession #:    [id number]     Weight:       327.2 lb Date of Birth:  1995-04-19      BSA:          2.547 m Patient Age:    28 years       BP:           127/85 mmHg Patient Gender: F              HR:           106 bpm. Exam Location:  Inpatient Procedure: 2D Echo, Cardiac Doppler and Color Doppler (Both Spectral and Color            Flow Doppler were utilized during procedure). Indications:    R00.0 Tachycardia  History:        Patient has no prior history of Echocardiogram examinations.                 Risk Factors:Hypertension.  Sonographer:    Andrena Bang Referring Phys: ATIF MAHMOOD IMPRESSIONS  1. Left ventricular ejection fraction, by estimation, is 55%. The left ventricle has normal function. The left ventricle has no regional wall motion abnormalities. Left ventricular diastolic parameters are consistent with Grade I diastolic dysfunction (impaired relaxation).  2. Right ventricular systolic function is normal. The right ventricular size is  normal. The estimated right ventricular systolic pressure is 47.1 mmHg.  3. The mitral valve is normal in structure. Trivial mitral valve regurgitation. No evidence of mitral stenosis.  4. The tricuspid valve is abnormal. Tricuspid  valve regurgitation is moderate to severe.  5. The aortic valve is tricuspid. Aortic valve regurgitation is not visualized. No aortic stenosis is present.  6. The inferior vena cava is normal in size with greater than 50% respiratory variability, suggesting right atrial pressure of 3 mmHg. FINDINGS  Left Ventricle: Left ventricular ejection fraction, by estimation, is 55%. The left ventricle has normal function. The left ventricle has no regional wall motion abnormalities. The left ventricular internal cavity size was normal in size. There is no left ventricular hypertrophy. Left ventricular diastolic parameters are consistent with Grade I diastolic dysfunction (impaired relaxation). Right Ventricle: The right ventricular size is normal. No increase in right ventricular wall thickness. Right ventricular systolic function is normal. The tricuspid regurgitant velocity is 3.32 m/s, and with an assumed right atrial pressure of 3 mmHg, the estimated right ventricular systolic pressure is 47.1 mmHg. Left Atrium: Left atrial size was normal in size. Right Atrium: Right atrial size was normal in size. Pericardium: There is no evidence of pericardial effusion. Mitral Valve: The mitral valve is normal in structure. Trivial mitral valve regurgitation. No evidence of mitral valve stenosis. Tricuspid Valve: The tricuspid valve is abnormal. Tricuspid valve regurgitation is moderate to severe. Aortic Valve: The aortic valve is tricuspid. Aortic valve regurgitation is not visualized. No aortic stenosis is present. Aortic valve mean gradient measures 6.0 mmHg. Aortic valve peak gradient measures 12.0 mmHg. Aortic valve area, by VTI measures 2.04  cm. Pulmonic Valve: The pulmonic valve was normal in  structure. Pulmonic valve regurgitation is not visualized. Aorta: The aortic root is normal in size and structure. Venous: The inferior vena cava is normal in size with greater than 50% respiratory variability, suggesting right atrial pressure of 3 mmHg. IAS/Shunts: No atrial level shunt detected by color flow Doppler.  LEFT VENTRICLE PLAX 2D LVIDd:         5.30 cm      Diastology LVIDs:         2.90 cm      LV e' medial:  12.90 cm/s LV PW:         1.10 cm      LV e' lateral: 12.60 cm/s LV IVS:        1.10 cm LVOT diam:     2.10 cm LV SV:         61 LV SV Index:   24 LVOT Area:     3.46 cm  LV Volumes (MOD) LV vol d, MOD A4C: 106.0 ml LV vol s, MOD A4C: 47.0 ml LV SV MOD A4C:     106.0 ml RIGHT VENTRICLE RV S prime:     12.70 cm/s TAPSE (M-mode): 1.1 cm LEFT ATRIUM           Index LA diam:      4.30 cm 1.69 cm/m LA Vol (A2C): 44.8 ml 17.59 ml/m LA Vol (A4C): 40.2 ml 15.79 ml/m  AORTIC VALVE AV Area (Vmax):    2.18 cm AV Area (Vmean):   2.09 cm AV Area (VTI):     2.04 cm AV Vmax:           173.00 cm/s AV Vmean:          110.000 cm/s AV VTI:            0.301 m AV Peak Grad:      12.0 mmHg AV Mean Grad:      6.0 mmHg LVOT Vmax:         109.00 cm/s  LVOT Vmean:        66.500 cm/s LVOT VTI:          0.177 m LVOT/AV VTI ratio: 0.59  AORTA Ao Asc diam: 3.30 cm TRICUSPID VALVE TR Peak grad:   44.1 mmHg TR Vmax:        332.00 cm/s  SHUNTS Systemic VTI:  0.18 m Systemic Diam: 2.10 cm Dalton McleanMD Electronically signed by Archer Bear Signature Date/Time: 07/17/2023/3:07:24 PM    Final    MR THORACIC SPINE W WO CONTRAST Result Date: 07/17/2023 CLINICAL DATA:  CSF leak/Spontaneous intracranial hypotension suspected; CSF leak suspected/ Spontaneous intracranial hypotension EXAM: MRI CERVICAL AND THORACIC SPINE WITHOUT AND WITH CONTRAST TECHNIQUE: Multiplanar and multiecho pulse sequences of the cervical spine, to include the craniocervical junction and cervicothoracic junction, and the thoracic spine, were obtained  without and with intravenous contrast. CONTRAST:  10mL GADAVIST  GADOBUTROL  1 MMOL/ML IV SOLN COMPARISON:  None Available. FINDINGS: MRI CERVICAL SPINE FINDINGS Alignment: Normal. Vertebrae: No fracture, evidence of discitis, or bone lesion. Cord: Normal cord signal.  No abnormal enhancement. Posterior Fossa, vertebral arteries, paraspinal tissues: Visualized vertebral artery flow voids are maintained no paraspinal edema. Partially imaged bilateral lung opacities. Disc levels: No significant canal or foraminal stenosis. No disc bulges. No visible dural outpouchings or dural defect. Normal CSF flow is noted on cines. MRI THORACIC SPINE FINDINGS Alignment: No substantial sagittal subluxation. Vertebrae: No fracture, evidence of discitis, or bone lesion. Cord: Normal cord signal. Paraspinal and other soft tissues: No paraspinal edema. Partially imaged bilateral lung opacities. Disc levels: No significant canal or foraminal stenosis. No disc bulges. No visible dural outpouchings or dural defect. IMPRESSION: 1. Partially imaged bilateral lung opacities, concerning for pneumonia. Recommend chest radiograph or chest CT to further characterize. 2. Otherwise, normal MRI of the cervical and thoracic spine. These results will be called to the ordering clinician or representative by the Radiologist Assistant, and communication documented in the PACS or [employer]. Electronically Signed   By: Stevenson Elbe M.D.   On: 07/17/2023 00:15   MR CERVICAL SPINE W WO CONTRAST Result Date: 07/17/2023 CLINICAL DATA:  CSF leak/Spontaneous intracranial hypotension suspected; CSF leak suspected/ Spontaneous intracranial hypotension EXAM: MRI CERVICAL AND THORACIC SPINE WITHOUT AND WITH CONTRAST TECHNIQUE: Multiplanar and multiecho pulse sequences of the cervical spine, to include the craniocervical junction and cervicothoracic junction, and the thoracic spine, were obtained without and with intravenous contrast. CONTRAST:  10mL  GADAVIST  GADOBUTROL  1 MMOL/ML IV SOLN COMPARISON:  None Available. FINDINGS: MRI CERVICAL SPINE FINDINGS Alignment: Normal. Vertebrae: No fracture, evidence of discitis, or bone lesion. Cord: Normal cord signal.  No abnormal enhancement. Posterior Fossa, vertebral arteries, paraspinal tissues: Visualized vertebral artery flow voids are maintained no paraspinal edema. Partially imaged bilateral lung opacities. Disc levels: No significant canal or foraminal stenosis. No disc bulges. No visible dural outpouchings or dural defect. Normal CSF flow is noted on cines. MRI THORACIC SPINE FINDINGS Alignment: No substantial sagittal subluxation. Vertebrae: No fracture, evidence of discitis, or bone lesion. Cord: Normal cord signal. Paraspinal and other soft tissues: No paraspinal edema. Partially imaged bilateral lung opacities. Disc levels: No significant canal or foraminal stenosis. No disc bulges. No visible dural outpouchings or dural defect. IMPRESSION: 1. Partially imaged bilateral lung opacities, concerning for pneumonia. Recommend chest radiograph or chest CT to further characterize. 2. Otherwise, normal MRI of the cervical and thoracic spine. These results will be called to the ordering clinician or representative by the Radiologist Assistant, and communication documented in the  PACS or [employer]. Electronically Signed   By: Stevenson Elbe M.D.   On: 07/17/2023 00:15   MR FEMUR LEFT WO CONTRAST Result Date: 07/16/2023 CLINICAL DATA:  Myositis. EXAM: MR OF THE LEFT FEMUR WITHOUT CONTRAST TECHNIQUE: Multiplanar, multisequence MR imaging of the left femur was performed. No intravenous contrast was administered. COMPARISON:  None Available. FINDINGS: Bones/Joint/Cartilage Normal bone mineralization. No cortical erosion. The patellar apex is approximately 7 mm lateralized with respect to the trochlear notch. Ligaments No ligament tear is seen. Muscles and Tendons There is mild edema within the mid to lateral  aspect of the left gluteus medius muscle and minimal edema within the proximal anterior aspect of the left gluteus minimus muscle (both innervated by the superior gluteal nerve) . There is confluent fluid measuring up to approximately 11 mm in transverse thickness deep to the iliotibial band and lateral to the gluteus medius muscle (axial series 11 images 15 through 23). There is also mild edema within the bilateral obturator externus, and left adductor longus, brevis, and magnus muscles (all innervated by the obturator nerve). Moderate edema within the long head of the biceps femoris muscle (tibial nerve) and mild edema within the semimembranosus muscle (tibial nerve). Mild edema within the partially visualized soleus and medial and lateral heads of the gastrocnemius muscles (all innervated by the tibial nerve). Very mild edema within the rectus femoris, vastus medialis, vastus intermedius, and vastus lateralis muscles (all innervated by the muscular branches of the femoral nerve). No significant edema within the tensor fascia lata (superior gluteal nerve), gluteus maximus (inferior gluteal nerve), sartorius (femoral nerve), gracilis (anterior division of the obturator nerve), short head of the biceps femoris (common peroneal nerve), or semitendinosis (tibial nerve) muscles. Soft tissues There is mild edema within the deep subcutaneous fat along the lateral border of the vastus lateralis muscle along an approximate 20 cm length of the mid to distal thigh (axial series 11 images 32 through 56 of 56 and axial series 11 images 1 through 17 of 40). IMPRESSION: 1. There is edema within the majority of the muscles of the left buttock and thigh, consistent with the expected nonspecific myositis. This may be from an inflammatory, infectious, drug related, or posttraumatic etiologies. Muscle edema femoral are general myopathy can also be from neurogenic etiology. 2. Edema within muscles innervated by the superior gluteal  nerve including gluteus medius and gluteus minimus muscles. However, no significant edema within the tensor fascia lata innervated by the superior gluteal nerve. 3. Edema within muscles innervated by the obturator nerve, including obturator externus, and the left adductor longus, brevis, and magnus. However, no significant edema within the gracilis that is innervated by the anterior division of the obturator nerve. 4. Edema within muscles innervated by the tibial nerve, including the long head of the biceps femoris muscle, semimembranosus, medial and lateral head of the gastrocnemius muscles. However, no significant edema within the semitendinosis innervated by the tibial nerve. 5. Edema within quadriceps muscles innervated by muscular branches of the femoral nerve, including the rectus femoris, vastus medialis, vastus intermedius, and vastus lateralis muscles. However, no significant edema within the sartorius muscle intermediate about the femoral nerve. Electronically Signed   By: Bertina Broccoli M.D.   On: 07/16/2023 19:13   US  Abdomen Limited RUQ (LIVER/GB) Result Date: 07/16/2023 CLINICAL DATA:  [id number] LFT elevation [id number] EXAM: ULTRASOUND ABDOMEN LIMITED COMPARISON:  None Available. FINDINGS: The liver demonstrates normal parenchymal echogenicity and homogeneous texture without focal hepatic parenchymal lesions or intrahepatic ductal dilatation. Layering  sludge in the gallbladder. There are no stones, wall thickening or pericholecystic fluid. CBD measured 0.1cm. IMPRESSION: Gallbladder sludge. Otherwise unremarkable examination Electronically Signed   By: Sydell Eva M.D.   On: 07/16/2023 16:28   MR Lumbar Spine W Wo Contrast Result Date: 07/15/2023 CLINICAL DATA:  28 year old female with polyneuropathy, bilateral lower extremity pain, generalized weakness. On immunosuppressive therapy. EXAM: MRI LUMBAR SPINE WITHOUT AND WITH CONTRAST TECHNIQUE: Multiplanar and multiecho pulse sequences of the lumbar  spine were obtained without and with intravenous contrast. CONTRAST:  10mL GADAVIST  GADOBUTROL  1 MMOL/ML IV SOLN COMPARISON:  Brain MRI today. Lumbar MRI without contrast on 07/08/2023. FINDINGS: Segmentation: Appears to be normal, as designated on the recent noncontrast MRI. Alignment: Stable lumbar lordosis. No significant scoliosis or spondylolisthesis. Vertebrae: Visualized bone marrow signal is within normal limits. Maintained vertebral height. Intact visible sacrum and SI joints. No marrow edema or evidence of acute osseous abnormality. Conus medullaris and cauda equina: Conus extends to the L1 level. Conus appears normal. No definite signal abnormality of the visible lower thoracic cord. No abnormal enhancement identified. No dural thickening. Cauda equina nerve roots appear unremarkable. Capacious spinal canal. Paraspinal and other soft tissues: Large body habitus. Otherwise negative. Disc levels: Stable and negative; subtle disc bulging and mild facet hypertrophy only at L5-S1 with no associated stenosis. IMPRESSION: Stable and essentially normal Lumbar MRI, without and with contrast. Electronically Signed   By: Marlise Simpers M.D.   On: 07/15/2023 06:19   MR BRAIN W WO CONTRAST Result Date: 07/15/2023 CLINICAL DATA:  28 year old female with polyneuropathy, bilateral lower extremity pain, generalized weakness. On immunosuppressive therapy. EXAM: MRI HEAD WITHOUT AND WITH CONTRAST TECHNIQUE: Multiplanar, multiecho pulse sequences of the brain and surrounding structures were obtained without and with intravenous contrast. CONTRAST:  10mL GADAVIST  GADOBUTROL  1 MMOL/ML IV SOLN COMPARISON:  Head CT 03/23/2019. FINDINGS: Brain: Normal cerebral volume. No restricted diffusion to suggest acute infarction. No midline shift, mass effect, evidence of mass lesion, ventriculomegaly, extra-axial collection or acute intracranial hemorrhage. Cervicomedullary junction and pituitary are within normal limits. Martina Sledge and white  matter signal is within normal limits throughout the brain. No encephalomalacia or chronic cerebral blood products identified. Postcontrast images are mildly motion degraded but no abnormal enhancement or dural thickening is identified. Vascular: Major intracranial vascular flow voids are preserved. Following contrast major dural venous sinuses appear to be enhancing and patent. Skull and upper cervical spine: Negative visible cervical spine. Visualized bone marrow signal is within normal limits. Sinuses/Orbits: Postoperative changes to the right globe. Otherwise negative orbits. Paranasal sinuses and mastoids are stable and well aerated. Other: Visible internal auditory structures appear normal. Negative stylomastoid foramina. Negative visible scalp and face. IMPRESSION: Normal MRI appearance of the Brain. Electronically Signed   By: Marlise Simpers M.D.   On: 07/15/2023 06:15   MR LUMBAR SPINE WO CONTRAST Result Date: 07/08/2023 CLINICAL DATA:  Left lower extremity weakness, numbness, and tingling with falls. EXAM: MRI LUMBAR SPINE WITHOUT CONTRAST TECHNIQUE: Multiplanar, multisequence MR imaging of the lumbar spine was performed. No intravenous contrast was administered. COMPARISON:  Lumbar spine radiographs 03/23/2019 FINDINGS: Segmentation:  Standard. Alignment:  Normal. Vertebrae: No fracture, suspicious marrow lesion, or significant marrow edema. Conus medullaris and cauda equina: Conus extends to the L1-2 level. Conus and cauda equina appear normal. Paraspinal and other soft tissues: Partially visualized moderate bladder distension. Disc levels: L1-2 through L3-4: Negative. L4-5: Normal disc. Minimal fluid in the right facet joint. No stenosis. L5-S1: Very early disc desiccation with preserved  disc height. Minimal disc bulging. Minimal facet joint fluid. No stenosis. IMPRESSION: Minimal disc bulging at L5-S1. No stenosis. Electronically Signed   By: Aundra Lee M.D.   On: 07/08/2023 17:01   DG Chest Portable 1  View Result Date: 07/07/2023 CLINICAL DATA:  Shortness of breath EXAM: PORTABLE CHEST 1 VIEW COMPARISON:  04/19/2023 FINDINGS: Right Port-A-Cath remains in place, unchanged. Heart and mediastinal contours are within normal limits. No focal opacities or effusions. No acute bony abnormality. IMPRESSION: No active cardiopulmonary disease. Electronically Signed   By: Janeece Mechanic M.D.   On: 07/07/2023 23:13    Physical Exam: LMP 06/27/2023 (Approximate) Comment: neg hcg on 07/12/23 Constitutional: Pleasant,well-developed, ***female in no acute distress. HEENT: Normocephalic and atraumatic. Conjunctivae are normal. No scleral icterus. Neck supple.  Cardiovascular: Normal rate, regular rhythm.  Pulmonary/chest: Effort normal and breath sounds normal. No wheezing, rales or rhonchi. Abdominal: Soft, nondistended, nontender. Bowel sounds active throughout. There are no masses palpable. No hepatomegaly. Extremities: no edema Lymphadenopathy: No cervical adenopathy noted. Neurological: Alert and oriented to person place and time. Skin: Skin is warm and dry. No rashes noted. Psychiatric: Normal mood and affect. Behavior is normal.   ASSESSMENT: 28 y.o. female here for assessment of the following  No diagnosis found.  PLAN:   Wilhemena Harbour, MD

## 2023-08-04 NOTE — Plan of Care (Signed)
  Problem: RH BOWEL ELIMINATION Goal: RH STG MANAGE BOWEL WITH ASSISTANCE Description: STG Manage Bowel with supervision Assistance. Outcome: Progressing   Problem: RH BLADDER ELIMINATION Goal: RH STG MANAGE BLADDER WITH ASSISTANCE Description: STG Manage Bladder With supervision Assistance Outcome: Progressing   Problem: RH SKIN INTEGRITY Goal: RH STG SKIN FREE OF INFECTION/BREAKDOWN Description: Manage skin free of infection/breakdown with supervision Outcome: Progressing   Problem: RH SAFETY Goal: RH STG ADHERE TO SAFETY PRECAUTIONS W/ASSISTANCE/DEVICE Description: STG Adhere to Safety Precautions With supervision  Assistance/Device. Outcome: Progressing   Problem: RH PAIN MANAGEMENT Goal: RH STG PAIN MANAGED AT OR BELOW PT'S PAIN GOAL Description: <4 w/ prns Outcome: Progressing   Problem: RH KNOWLEDGE DEFICIT GENERAL Goal: RH STG INCREASE KNOWLEDGE OF SELF CARE AFTER HOSPITALIZATION Description: Manage increase knowledge of self care after hospitalization with supervision from mother using educational materials provided Outcome: Progressing

## 2023-08-04 NOTE — Progress Notes (Signed)
 PROGRESS NOTE   Subjective/Complaints: Shooting pain was better last night.  She thinks this is because she kept a temperature a little warmer.  She continues to have same anxiety, feels like she is doing better and worried this means something bad will happen.  Nursing had some concerns about blood pressure with high diastolic but stable systolic blood pressure.  BP was taken with automatic cuff at the wrist and improved when taken manually upper arm.  LBM 5/7   ROS: as per HPI. Denies CP, SOB, abd pain, N/V/D/C, or any other complaints at this time.   + burning pain both feet--improved + Insomnia, improved + Orthostatic hypotension- improved + dysuria- resolved + Anxiety-continued intermittent anxiety  Objective:   No results found.   Recent Labs    08/03/23 0359  WBC 3.0*  HGB 8.5*  HCT 27.1*  PLT 331    Recent Labs    08/03/23 0359  NA 137  K 3.5  CL 112*  CO2 16*  GLUCOSE 96  BUN 10  CREATININE 0.54  CALCIUM  8.9    Intake/Output Summary (Last 24 hours) at 08/04/2023 1550 Last data filed at 08/04/2023 1002 Gross per 24 hour  Intake 10 ml  Output --  Net 10 ml        Physical Exam: Vital Signs Blood pressure 115/77, pulse 76, temperature 98.1 F (36.7 C), resp. rate 17, height 5\' 9"  (1.753 m), weight (!) 138.6 kg, last menstrual period 06/27/2023, SpO2 100%.    General: NAD, laying in bed appears comfortable HEENT: Head is normocephalic, atraumatic, MMM Neck: Supple without JVD or lymphadenopathy Heart: RRR Chest: CTA bilaterally without wheezes, rales, or rhonchi; no distress. Port R chest  Abdomen: Soft, non-tender, non-distended, bowel sounds positive. Extremities: No clubbing, cyanosis, Tr b/l edema.  Psych: A little anxious appearing  Skin: Clean and intact without signs of breakdown over exposed surfaces.  Right chest port C-D-I  Neuro:  Awake, alert, oriented x 4.  No apparent  cognitive deficits. Strength antigravity 4- out of 5 bilateral upper extremities, 4-/5 bilateral hip flexors, 4 out of 5 distal lower extremities. Hypersensitivity to light touch in bilateral feet/ankles-decreased from prior.  Otherwise, sensation intact No abnormal tone noted   Assessment/Plan: 1. Functional deficits which require 3+ hours per day of interdisciplinary therapy in a comprehensive inpatient rehab setting. Physiatrist is providing close team supervision and 24 hour management of active medical problems listed below. Physiatrist and rehab team continue to assess barriers to discharge/monitor patient progress toward functional and medical goals  Care Tool:  Bathing    Body parts bathed by patient: Right arm, Left arm, Chest, Abdomen, Front perineal area, Buttocks, Right upper leg, Left upper leg, Right lower leg, Left lower leg, Face   Body parts bathed by helper: Buttocks, Right upper leg, Left upper leg, Right lower leg, Left lower leg     Bathing assist Assist Level: Contact Guard/Touching assist     Upper Body Dressing/Undressing Upper body dressing   What is the patient wearing?: Pull over shirt    Upper body assist Assist Level: Supervision/Verbal cueing    Lower Body Dressing/Undressing Lower body dressing    Lower body  dressing activity did not occur: Environmental limitations (no clothes available) What is the patient wearing?:  (Shorts)     Lower body assist Assist for lower body dressing: Minimal Assistance - Patient > 75%     Financial trader Activity did not occur Press photographer and hygiene only): N/A (no void or bm)  Toileting assist Assist for toileting: Independent with assistive device     Transfers Chair/bed transfer  Transfers assist     Chair/bed transfer assist level: Minimal Assistance - Patient > 75%     Locomotion Ambulation   Ambulation assist   Ambulation activity did not occur: Safety/medical  concerns  Assist level: 2 helpers Assistive device: Walker-rolling Max distance: 175'   Walk 10 feet activity   Assist  Walk 10 feet activity did not occur: Safety/medical concerns  Assist level: Minimal Assistance - Patient > 75% Assistive device: Walker-rolling   Walk 50 feet activity   Assist Walk 50 feet with 2 turns activity did not occur: Safety/medical concerns  Assist level: 2 helpers (+2 WC follow) Assistive device: Walker-rolling    Walk 150 feet activity   Assist Walk 150 feet activity did not occur: Safety/medical concerns  Assist level: 2 helpers Assistive device: Walker-rolling    Walk 10 feet on uneven surface  activity   Assist Walk 10 feet on uneven surfaces activity did not occur: Safety/medical concerns         Wheelchair     Assist Is the patient using a wheelchair?: No             Wheelchair 50 feet with 2 turns activity    Assist            Wheelchair 150 feet activity     Assist          Blood pressure 115/77, pulse 76, temperature 98.1 F (36.7 C), resp. rate 17, height 5\' 9"  (1.753 m), weight (!) 138.6 kg, last menstrual period 06/27/2023, SpO2 100%.  Medical Problem List and Plan: 1. 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 If unaccessed?             -ELOS/Goals: 2-4 weeks -  min A hopefully             -Continue CIR  - 4/29: Having help from mom/dad at home in discharge. Mod-Max A ADLs with bed-level LB. Biggest barriers are weakness and some self-limiting behaviors. Walked 15 feet with EVA walker. Regular walker STS today.  -Team conference  note completed -Expected DC was adjusted to 5/21  2.  Antithrombotics: -DVT/anticoagulation:  Pharmaceutical: Lovenox  70mg  daily             -antiplatelet  therapy: N/A 3. Pain Management: Has been using oxycodone  prn past 3 days? For foot, leg and back pain- also has Gabapentin  800mg  TID (increased 4/25) - duloxetine  stopped initially due to myositis- 30 mg daily resumed 4/23 -07/23/23 feet very sensitive, but cymbalta  and gabapentin  recently adjusted, lidocaine  cream not enough so asked that pharmacy send 15g tubes per treatment, to adequately cover the plantar surfaces of her feet to see if we can get relief.  4-28: Remains very sensitive, does feel some improvement 4-29: Patient with some daytime lethargy, pain primarily at night.  Reduce daytime gabapentin  to 600 mg every morning/600 mg q. afternoon, increase  nighttime dose to 1000 mg.  Increase duloxetine  to 30 mg twice daily. 4-30: Patient did much better overnight with the above changes. 5-1: Discussed with patient further weaning daytime gabapentin ; she feels good on current regimen and wishes to hold off at this time 5/5 add prn robaxin  for muscle spams 5/6 tramadol  PRN started 5/7 patient reports pain controlled today other than mild headache improving with Tylenol .  Continue to monitor 5/8 Continue current pain regimen.  Do not want to increase gabapentin  due to concerns of sedation.  Could consider trying Lyrica 5/9 patient reports pain is doing better when she keeps her legs warm.  Denies any pain currently or last night.  Continue current regimen.  Qutenza could be option outpatient if her distal neuropathy pain worsens again.  Overall pain has been controlled so we will hold off on adjustments.  4. Mood/Behavior/Sleep: LCSW to follow for evaluation and support.              -antipsychotic agents: N/A  -Neuropsych consult  -07/23/23 didn't sleep well, has melatonin PRN to use - 4-28: Sleep interrupted by pain as above.  Increasing gabapentin  and duloxetine ; may benefit from transition to Elavil nightly If no improvement 4-30: Sleeping better.  Thinks melatonin as needed is over sedating,  advised that this is per her request.  5. Neuropsych/cognition: This patient is capable of making decisions on her own behalf. - 4-28: Dr. Cheryll Corti evaluated today; appreciate his professional assessment  6. Skin/Wound Care: Routine pressure relief measures.   - Chest port appears clean, routine dressing changes  7. Fluids/Electrolytes/Nutrition: Monitor I/O. Continue Ensure supplements and vitamin supplementation -07/22/23 hypoK+ 3.4, KCL 40meq po x 1- recheck 4/28 -07/23/23 added labs for tomorrow (Mg/phos, CMP, CK) 4/28: Hypokalemia worsened to 2.9 status post 40 mill equivalents; add 40 mill equivalents KCl PO, +40 mill equivalents IV today.  Repeat BMP tonight and tomorrow a.m. Phos elevated, so we will avoid K-Phos--repeat potassium 3.4 at 6 PM. 4-29: Hypokalemia resolved.  Repeat labs Thursday. 5/1: Potassium back to 2.9.  DC HCTZ.  Add 40 mill equivalents daily p.o. repletion, another 20 mill equivalents IV today.  Repeat labs in AM. 07/29/23 K 3.1 yesterday, will do 20mEq IV once today and continue the 40mEq PO, repeat labs Monday 5/5 K+ up to 3.3, increase to 30meq BID 5/8 K+ up to 3.5 continue supplment, MG low at 1.6, IV Mg 2 g given after discussion with pharmacy.  Recheck tomorrow  8. Non-specific myositis: Cymbalta  d/c by neurology due to concerns of rhabdomyolysis --Cymbalta  30 mg resumed 04/23 given improving myositis.  --Monitor CK intermittently improved from 1536-->514--> 398 --consider muscle biopsy if symptoms do not improve -07/23/23 CK to be done tomorrow and weekly on Mon 4-28: CK downtrending, continue to trend 5/1: CK has normalized.  9. Multifocal PNA: Likely aspiration--most significant in lingula and RLL on CT w/o contrast chest 07/18/23 --Hx of chronic intermittent N/V. May need swallow evaluation. Swallow precautions added.   --started on Augmentin  04/22-->04/25 for 5 day course.  -No apparent signs of respiratory distress  11. Multiple Vitamin  deficiencies: Vitamin B1  250 mg daily 4/22-->4/28 followed by 100 mg daily IV.  --now on B12 IM+PO, IV folate. Vitamin A  10,000 units daily. Vitamin D  50,000/wk, and MVI --recheck Mg, Phos (supplemented)-- ordered for Monday 4/28--Phos improving, magnesium  stable -5/8 MG low, discussed pharm- IV 2g today recheck saturday  12. Resting tachycardia: HR has been 110-120 range at baseline --not on any BB -Start metoprolol  12.5  mg BID -07/23/23 still tachycardic but less, monitor  - intermittent, asymptomatic. Monitor      08/04/2023    1:10 PM 08/04/2023   11:19 AM 08/04/2023    9:45 AM  Vitals with BMI  Systolic 115 120 308  Diastolic 77 78 98  Pulse 76  101     13. Accelerated HTN/orthostatic hypotension: BP poorly controlled. Continue hydrochlorothiazide  12.5mg  daily and Avapro  150mg  daily -- addition of BB? -4/25 BB started as in #12 -07/23/23 BPs better, monitor; of note, pharmacy note states pt was on amlodipine  previously? Remain off for now, but note this for d/c 4/28 : reduce HCTZ to 6.125 due to recurrent hypokalemia--BP stable 5/1: BUN elevated, blood pressure soft, hypokalemia as above; DC hydrochlorothiazide .  Got 500 cc IV fluid bolus this afternoon. 5-2: Remains low, irbesartan  reduced to 75 mg this AM; DC given ongoing symptomatic orthostasis, can consider resumption once BP comes up.  Will give 1 L IV fluids today and patient thinks she can tolerate TED hose now, so we will add this on.  Will get orthostatics after IV fluid. -5/3-4/25 BPs improving; monitor  -5/6-8 BP controlled, continue current regimen -5/9 patient felt to have high diastolic BP.  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 Latanoprost  and alphagan  gtts             --IVIG 04/21- 04/24. Followed by Dr. Curley Double. -07/23/23 humira, retinA, and rhopressa  listed in pharmacy note as "resume as needed in CIR or at discharge"; clarify when/if these need to be restarted while here 4-29: Should be OK to resume Retin A and Rhopressa ; will inquire about bringing from home. -- will check with Optho about Cellcept and Humira resumption.  4-30: Family brought Rhopressa  and Humira in from home; pharmacy holding Humira until clarified by Optho.  Rhopressa  resumed. 5-1: Spoke with Dr. Mason Sole, continue to hold Humira and CellCept pending outpatient follow-up with him shortly after discharge.  15. Urinary retention: Purewick being used--monitor voiding with PVR/bladder scan. Toilet every 4 hours  -4/25 PVR 64, continue to monitor   - 4-28: No recent PVRs documented, patient denies incontinence, monitor  16. Chronic Asthma: Continue Dulera  BID and singulair  10mg  nightly, azelastin BID --Followed by Dr. Idolina Maker.   17. H/o chronic gastritis w/nausea and vomiting: D/c Ibuprofen . Continue Protonix  40 mg daily and Pepcid  40mg  nightly; was on reglan  at home, has PRN compazine  here. Carafate  1g BID.  -- Followed by [facility].  No complaints while at rehab  18. Abnormal LFTs: Normal liver parenchyma and GB sludge noted on abdominal ultrasound 07/16/23.  --Resolving. Recheck in am-pending - 07/23/23 LFTs a bit improved 4/26, repeat CMP tomorrow morning; of note, HepB SAb reactive, core Ab positive 4-28: LFTs looking better Recheck monday  19. Peripheral polyneuropathy: Cymbalta  resumed 04/23 as CK trending down. Will check weekly -- See #3 above; may benefit from transition to Lyrica or trial of Qutenza as outpatient.  Was establishing  with Dr. Rayleen Cal  18.  Anemia of chronic disease: Likely malabsorption w/ Low iron but elevated TIBC/Ferritin -07/23/23 Hgb 9.9 on Fri, repeat weekly on Mon/Thurs; iron studies done 4/21 showing low TIBC 172, iron 46, ferritin elevated 791.  -5/8 HGB overall stable 8.5  19. Morbid obesity: BMI 48. Followed by RD/Bariatric nutrition for wt loss. Plan for Bariatric surgery 08/2023.              --Now on aggressive vitamin supplementation as above  20. Hyponatremia: Na down to 132-->question due to IVIG 4/31-4/24.  --May see rise in LFTs again.  -4/25 recheck labs still pending, will ask nursing to call to check on this -07/23/23 Na 131 on 4/26, recheck tomorrow to see trend or need for supplementation  4/28: NA stable 132. 5/1: Na 134, stable>> 3.1 5/2; monitor  5/8 Stable 137  21. Severe tricuspid regurgitation/grade 1 diastolic heart failure   - 4/29: add daily weights with diuretic adjustments--stable -5/2: Weights remain stable, no external edema with DC hydrochlorothiazide .  Continue to monitor closely. -07/30/23 wt down significantly today, wonder if it's inaccurate; monitor trend -5/9 weight is overall stable, continue current Filed Weights   08/01/23 0512 08/02/23 0500 08/04/23 0652  Weight: (!) 140 kg (!) 140 kg (!) 138.6 kg    22. Gout: continue allopurinol  100mg  QD; off colchicine  for now, resume as appropriate   23. Diarrhea/constipation: -07/23/23 pt now having runny stools, thinks it's the food; stop miralax  daily and change to PRN; monitor 4-28: Ongoing liquid stool; encourage p.o. fluids, replete electrolytes as above, and adding fibercon supplemen\t   - 4/29: No Bms today; slowing down. [place] - 4-30: Diarrhea followed by smears, KUB performed to ensure no overflow incontinence, was normal. 5/1: KUB normal, patient feels she is having adequate bowel movements.   Last bowel movement 5-2, adequate per patient  LBM 5/7, consider additional medication if no BM by  tomorrow 24. Dysuria:  -07/29/23 pt stating some dysuria/difficulty with urination; ordered U/A -07/30/23 U/A not done yesterday d/t BMs everytime she urinated; will try urinal today; f/up on results  -4/5/5 U/A 5/3 with 21-50 WBC, many bacteria start kelfex for suspected UTI, culture added symptoms have improved, Proteus on urine culture, change to Augmentin  and DC keflex   25. Anxiety  -xanax  0.25mg  PRN  -5/7 Buspar  daily started 5mg - monitor response  LOS: 15 days A FACE TO FACE EVALUATION WAS PERFORMED  Lylia Sand 08/04/2023, 3:50 PM

## 2023-08-05 LAB — CBC
HCT: 28.1 % — ABNORMAL LOW (ref 36.0–46.0)
Hemoglobin: 8.7 g/dL — ABNORMAL LOW (ref 12.0–15.0)
MCH: 29 pg (ref 26.0–34.0)
MCHC: 31 g/dL (ref 30.0–36.0)
MCV: 93.7 fL (ref 80.0–100.0)
Platelets: 288 10*3/uL (ref 150–400)
RBC: 3 MIL/uL — ABNORMAL LOW (ref 3.87–5.11)
RDW: 20.6 % — ABNORMAL HIGH (ref 11.5–15.5)
WBC: 2.7 10*3/uL — ABNORMAL LOW (ref 4.0–10.5)
nRBC: 0 % (ref 0.0–0.2)

## 2023-08-05 LAB — BASIC METABOLIC PANEL WITH GFR
Anion gap: 8 (ref 5–15)
BUN: 10 mg/dL (ref 6–20)
CO2: 16 mmol/L — ABNORMAL LOW (ref 22–32)
Calcium: 9.1 mg/dL (ref 8.9–10.3)
Chloride: 113 mmol/L — ABNORMAL HIGH (ref 98–111)
Creatinine, Ser: 0.62 mg/dL (ref 0.44–1.00)
GFR, Estimated: >60 mL/min (ref >60)
Glucose, Bld: 92 mg/dL (ref 70–99)
Potassium: 3.5 mmol/L (ref 3.5–5.1)
Sodium: 137 mmol/L (ref 135–145)

## 2023-08-05 LAB — MAGNESIUM: Magnesium: 1.9 mg/dL (ref 1.7–2.4)

## 2023-08-05 NOTE — Progress Notes (Signed)
 PROGRESS NOTE   Subjective/Complaints:  Pt doing well, but slept poorly d/t pain in her feet. Still, says pain is tolerable and just started tramadol  for it. LBM 2 days ago per pt, but none documented since 5/7. Urinating fine. Denies any other complaints or concerns.    ROS: as per HPI. Denies CP, SOB, abd pain, N/V/D, or any other complaints at this time.   + burning pain both feet--improved + Insomnia, improved + Orthostatic hypotension- improved + dysuria- resolved + Anxiety-continued intermittent anxiety  Objective:   No results found.   Recent Labs    08/03/23 0359 08/05/23 0440  WBC 3.0* 2.7*  HGB 8.5* 8.7*  HCT 27.1* 28.1*  PLT 331 288    Recent Labs    08/03/23 0359 08/05/23 0440  NA 137 137  K 3.5 3.5  CL 112* 113*  CO2 16* 16*  GLUCOSE 96 92  BUN 10 10  CREATININE 0.54 0.62  CALCIUM  8.9 9.1    Intake/Output Summary (Last 24 hours) at 08/05/2023 1228 Last data filed at 08/05/2023 0924 Gross per 24 hour  Intake 250 ml  Output --  Net 250 ml        Physical Exam: Vital Signs Blood pressure (!) 134/102, pulse 97, temperature 97.9 F (36.6 C), resp. rate 19, height 5\' 9"  (1.753 m), weight (!) 138.6 kg, last menstrual period 06/27/2023, SpO2 100%.    General: NAD, moving around in bed appears comfortable HEENT: Head is normocephalic, atraumatic, MMM Neck: Supple without JVD or lymphadenopathy Heart: RRR Chest: CTA bilaterally without wheezes, rales, or rhonchi; no distress. Port R chest  Abdomen: Soft, non-tender, non-distended, bowel sounds positive. Extremities: No clubbing, cyanosis, Tr b/l edema.  Psych: A little anxious appearing , more flat this weekend Skin: Clean and intact without signs of breakdown over exposed surfaces.  Right chest port C-D-I  PRIOR EXAMS: Neuro:  Awake, alert, oriented x 4.  No apparent cognitive deficits. Strength antigravity 4- out of 5 bilateral upper  extremities, 4-/5 bilateral hip flexors, 4 out of 5 distal lower extremities. Hypersensitivity to light touch in bilateral feet/ankles-decreased from prior.  Otherwise, sensation intact No abnormal tone noted   Assessment/Plan: 1. Functional deficits which require 3+ hours per day of interdisciplinary therapy in a comprehensive inpatient rehab setting. Physiatrist is providing close team supervision and 24 hour management of active medical problems listed below. Physiatrist and rehab team continue to assess barriers to discharge/monitor patient progress toward functional and medical goals  Care Tool:  Bathing    Body parts bathed by patient: Right arm, Left arm, Chest, Abdomen, Front perineal area, Buttocks, Right upper leg, Left upper leg, Right lower leg, Left lower leg, Face   Body parts bathed by helper: Buttocks, Right upper leg, Left upper leg, Right lower leg, Left lower leg     Bathing assist Assist Level: Contact Guard/Touching assist     Upper Body Dressing/Undressing Upper body dressing   What is the patient wearing?: Pull over shirt    Upper body assist Assist Level: Supervision/Verbal cueing    Lower Body Dressing/Undressing Lower body dressing    Lower body dressing activity did not occur: Environmental limitations (no clothes  available) What is the patient wearing?:  (Shorts)     Lower body assist Assist for lower body dressing: Minimal Assistance - Patient > 75%     Toileting Toileting Toileting Activity did not occur Press photographer and hygiene only): N/A (no void or bm)  Toileting assist Assist for toileting: Independent with assistive device     Transfers Chair/bed transfer  Transfers assist     Chair/bed transfer assist level: Minimal Assistance - Patient > 75%     Locomotion Ambulation   Ambulation assist   Ambulation activity did not occur: Safety/medical concerns  Assist level: 2 helpers Assistive device: Walker-rolling Max  distance: 175'   Walk 10 feet activity   Assist  Walk 10 feet activity did not occur: Safety/medical concerns  Assist level: Minimal Assistance - Patient > 75% Assistive device: Walker-rolling   Walk 50 feet activity   Assist Walk 50 feet with 2 turns activity did not occur: Safety/medical concerns  Assist level: 2 helpers (+2 WC follow) Assistive device: Walker-rolling    Walk 150 feet activity   Assist Walk 150 feet activity did not occur: Safety/medical concerns  Assist level: 2 helpers Assistive device: Walker-rolling    Walk 10 feet on uneven surface  activity   Assist Walk 10 feet on uneven surfaces activity did not occur: Safety/medical concerns         Wheelchair     Assist Is the patient using a wheelchair?: No             Wheelchair 50 feet with 2 turns activity    Assist            Wheelchair 150 feet activity     Assist          Blood pressure (!) 134/102, pulse 97, temperature 97.9 F (36.6 C), resp. rate 19, height 5\' 9"  (1.753 m), weight (!) 138.6 kg, last menstrual period 06/27/2023, SpO2 100%.  Medical Problem List and Plan: 1. 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 If unaccessed?             -ELOS/Goals: 2-4 weeks -  min A hopefully             -Continue CIR  - 4/29: Having help from mom/dad at home in discharge. Mod-Max A ADLs with bed-level LB. Biggest barriers are weakness and some self-limiting behaviors. Walked 15 feet with EVA walker. Regular walker STS today.  -Team conference  note completed -Expected DC was adjusted to 5/21  2.  Antithrombotics: -DVT/anticoagulation:  Pharmaceutical: Lovenox  70mg  daily             -antiplatelet therapy: N/A 3. Pain Management: Has been using oxycodone  prn past 3  days? For foot, leg and back pain- also has Gabapentin  800mg  TID (increased 4/25) - duloxetine  stopped initially due to myositis- 30 mg daily resumed 4/23 -07/23/23 feet very sensitive, but cymbalta  and gabapentin  recently adjusted, lidocaine  cream not enough so asked that pharmacy send 15g tubes per treatment, to adequately cover the plantar surfaces of her feet to see if we can get relief.  4-28: Remains very sensitive, does feel some improvement 4-29: Patient with some daytime lethargy, pain primarily at night.  Reduce daytime gabapentin  to 600 mg every morning/600 mg q. afternoon, increase nighttime dose to 1000 mg.  Increase duloxetine   to 30 mg twice daily. 4-30: Patient did much better overnight with the above changes. 5-1: Discussed with patient further weaning daytime gabapentin ; she feels good on current regimen and wishes to hold off at this time 5/5 add prn robaxin  for muscle spams 5/6 tramadol  PRN started 5/7 patient reports pain controlled today other than mild headache improving with Tylenol .  Continue to monitor 5/8 Continue current pain regimen.  Do not want to increase gabapentin  due to concerns of sedation.  Could consider trying Lyrica 5/9 patient reports pain is doing better when she keeps her legs warm.  Denies any pain currently or last night.  Continue current regimen.  Qutenza could be option outpatient if her distal neuropathy pain worsens again.  Overall pain has been controlled so we will hold off on adjustments.  4. Mood/Behavior/Sleep: LCSW to follow for evaluation and support.              -antipsychotic agents: N/A  -Neuropsych consult  -07/23/23 didn't sleep well, has melatonin PRN to use - 4-28: Sleep interrupted by pain as above.  Increasing gabapentin  and duloxetine ; may benefit from transition to Elavil nightly If no improvement 4-30: Sleeping better.  Thinks melatonin as needed is over sedating, advised that this is per her request.  5. Neuropsych/cognition: This  patient is capable of making decisions on her own behalf. - 4-28: Dr. Cheryll Corti evaluated today; appreciate his professional assessment  6. Skin/Wound Care: Routine pressure relief measures.   - Chest port appears clean, routine dressing changes  7. Fluids/Electrolytes/Nutrition: Monitor I/O. Continue Ensure supplements and vitamin supplementation -07/22/23 hypoK+ 3.4, KCL 40meq po x 1- recheck 4/28 -07/23/23 added labs for tomorrow (Mg/phos, CMP, CK) 4/28: Hypokalemia worsened to 2.9 status post 40 mill equivalents; add 40 mill equivalents KCl PO, +40 mill equivalents IV today.  Repeat BMP tonight and tomorrow a.m. Phos elevated, so we will avoid K-Phos--repeat potassium 3.4 at 6 PM. 4-29: Hypokalemia resolved.  Repeat labs Thursday. 5/1: Potassium back to 2.9.  DC HCTZ.  Add 40 mill equivalents daily p.o. repletion, another 20 mill equivalents IV today.  Repeat labs in AM. 07/29/23 K 3.1 yesterday, will do 20mEq IV once today and continue the 40mEq PO, repeat labs Monday 5/5 K+ up to 3.3, increase to 30meq BID 5/8 K+ up to 3.5 continue supplment, MG low at 1.6, IV Mg 2 g given after discussion with pharmacy.  Recheck tomorrow -08/05/23 Mg 1.9, K 3.5, monitor  8. Non-specific myositis: Cymbalta  d/c by neurology due to concerns of rhabdomyolysis --Cymbalta  30 mg resumed 04/23 given improving myositis.  --Monitor CK intermittently improved from 1536-->514--> 398 --consider muscle biopsy if symptoms do not improve -07/23/23 CK to be done tomorrow and weekly on Mon 4-28: CK downtrending, continue to trend 5/1: CK has normalized.  9. Multifocal PNA: Likely aspiration--most significant in lingula and RLL on CT w/o contrast chest 07/18/23 --Hx of chronic intermittent N/V. May need swallow evaluation. Swallow precautions added.   --started on Augmentin  04/22-->04/25 for 5 day course.  -No apparent signs of respiratory distress  11. Multiple Vitamin deficiencies: Vitamin B1  250 mg daily 4/22-->4/28  followed by 100 mg daily IV.  --now on B12 IM+PO, IV folate. Vitamin A  10,000 units daily. Vitamin D  50,000/wk, and MVI --recheck Mg, Phos (supplemented)-- ordered for Monday 4/28--Phos improving, magnesium  stable -5/8 MG low, discussed pharm- IV 2g today recheck Saturday-- see above #7  12. Resting tachycardia: HR has been 110-120 range at baseline --not on any BB -Start metoprolol   12.5 mg BID -07/23/23 still tachycardic but less, monitor  - intermittent, asymptomatic. Monitor -08/05/23 improving, monitor      08/05/2023    9:20 AM 08/05/2023    3:42 AM 08/04/2023    8:55 PM  Vitals with BMI  Systolic 134 133   Diastolic 102 97   Pulse 97 98 83     13. Accelerated HTN/orthostatic hypotension: BP poorly controlled. Continue hydrochlorothiazide  12.5mg  daily and Avapro  150mg  daily -- addition of BB? -4/25 BB started as in #12 -07/23/23 BPs better, monitor; of note, pharmacy note states pt was on amlodipine  previously? Remain off for now, but note this for d/c 4/28 : reduce HCTZ to 6.125 due to recurrent hypokalemia--BP stable 5/1: BUN elevated, blood pressure soft, hypokalemia as above; DC hydrochlorothiazide .  Got 500 cc IV fluid bolus this afternoon. 5-2: Remains low, irbesartan  reduced to 75 mg this AM; DC given ongoing symptomatic orthostasis, can consider resumption once BP comes up.  Will give 1 L IV fluids today and patient thinks she can tolerate TED hose now, so we will add this on.  Will get orthostatics after IV fluid. -5/3-4/25 BPs improving; monitor -5/6-8 BP controlled, continue current regimen -5/9 patient felt to have high diastolic BP.  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 Latanoprost  and alphagan  gtts             --IVIG 04/21- 04/24. Followed by Dr. Curley Double. -07/23/23 humira, retinA, and rhopressa  listed in pharmacy note as "resume as needed in CIR or at discharge"; clarify when/if these need to be restarted while here 4-29: Should be OK to resume Retin A and Rhopressa ; will inquire about bringing from home. -- will check with Optho about Cellcept and Humira resumption.  4-30: Family brought Rhopressa  and Humira in from home; pharmacy holding Humira until clarified by Optho.  Rhopressa  resumed. 5-1: Spoke with Dr. Mason Sole, continue to hold Humira and CellCept pending outpatient follow-up with him shortly after discharge.  15. Urinary retention: Purewick being used--monitor voiding with PVR/bladder scan. Toilet every 4 hours  -4/25 PVR 64, continue to monitor   - 4-28: No recent PVRs documented, patient denies incontinence, monitor  16. Chronic Asthma: Continue Dulera  BID and singulair  10mg  nightly, azelastin BID --Followed by Dr. Idolina Maker.   17. H/o chronic gastritis w/nausea and vomiting: D/c Ibuprofen . Continue Protonix  40 mg daily and Pepcid  40mg  nightly; was on reglan  at home, has PRN compazine  here. Carafate  1g BID.  -- Followed by [facility].  No complaints while at rehab  18. Abnormal LFTs: Normal liver parenchyma and GB sludge noted on abdominal ultrasound 07/16/23.  --Resolving. Recheck in am-pending - 07/23/23 LFTs a bit improved 4/26, repeat CMP tomorrow morning; of note, HepB SAb reactive, core Ab positive 4-28: LFTs looking better Recheck monday  19. Peripheral polyneuropathy: Cymbalta  resumed 04/23 as CK trending down. Will check weekly -- See #3 above; may benefit from transition to Lyrica or trial of Qutenza as  outpatient.  Was establishing with Dr. Rayleen Cal  18. Anemia of chronic disease: Likely malabsorption w/ Low iron but elevated TIBC/Ferritin -07/23/23 Hgb 9.9 on Fri, repeat weekly on Mon/Thurs; iron studies done 4/21 showing low TIBC 172, iron 46, ferritin elevated 791.  -5/8 HGB overall stable 8.5 -08/05/23 Hgb up to 8.7, monitor  19. Morbid obesity: BMI 48. Followed by RD/Bariatric nutrition for wt loss. Plan for Bariatric surgery 08/2023.              --Now on aggressive vitamin supplementation as above  20. Hyponatremia: Na down to 132-->question due to IVIG 4/31-4/24.  --May see rise in LFTs again.  -4/25 recheck labs still pending, will ask nursing to call to check on this -07/23/23 Na 131 on 4/26, recheck tomorrow to see trend or need for supplementation  4/28: NA stable 132. 5/1: Na 134, stable>> 3.1 5/2; monitor  5/10 Stable 137  21. Severe tricuspid regurgitation/grade 1 diastolic heart failure   - 4/29: add daily weights with diuretic adjustments--stable -5/2: Weights remain stable, no external edema with DC hydrochlorothiazide .  Continue to monitor closely. -07/30/23 wt down significantly today, wonder if it's inaccurate; monitor trend -5/9 weight is overall stable, continue current Filed Weights   08/01/23 0512 08/02/23 0500 08/04/23 0652  Weight: (!) 140 kg (!) 140 kg (!) 138.6 kg    22. Gout: continue allopurinol  100mg  QD; off colchicine  for now, resume as appropriate   23. Diarrhea/constipation: -07/23/23 pt now having runny stools, thinks it's the food; stop miralax  daily and change to PRN; monitor 4-28: Ongoing liquid stool; encourage p.o. fluids, replete electrolytes as above, and adding fibercon supplemen\t   - 4/29: No Bms today; slowing down. [place] - 4-30: Diarrhea followed by smears, KUB performed to ensure no overflow incontinence, was normal. 5/1: KUB normal, patient feels she is having adequate bowel movements.   Last bowel movement 5-2, adequate per  patient  LBM 5/7, consider additional medication if no BM by tomorrow -08/05/23 LBM 2 days ago per pt, 3 days ago per documentation; will see if she has BM today but if not then will start meds. Encouraged to use miralax  today  24. Dysuria:  -07/29/23 pt stating some dysuria/difficulty with urination; ordered U/A -07/30/23 U/A not done yesterday d/t BMs everytime she urinated; will try urinal today; f/up on results  -5/5/5 U/A 5/3 with 21-50 WBC, many bacteria start kelfex for suspected UTI, culture added 08/04/23 symptoms have improved, Proteus on urine culture, change to Augmentin  and DC keflex  -08/05/23 got 3.5 days of keflex , so augmentin  through 5/14   25. Anxiety  -xanax  0.25mg  PRN  -5/7 Buspar  daily started 5mg - monitor response  LOS: 16 days A FACE TO FACE EVALUATION WAS PERFORMED  [address] 08/05/2023, 12:28 PM

## 2023-08-05 NOTE — Plan of Care (Signed)
  Problem: Consults Goal: RH GENERAL PATIENT EDUCATION Description: See Patient Education module for education specifics. Outcome: Progressing   Problem: RH BOWEL ELIMINATION Goal: RH STG MANAGE BOWEL WITH ASSISTANCE Description: STG Manage Bowel with supervision Assistance. Outcome: Progressing   Problem: RH BLADDER ELIMINATION Goal: RH STG MANAGE BLADDER WITH ASSISTANCE Description: STG Manage Bladder With supervision Assistance Outcome: Progressing   Problem: RH SKIN INTEGRITY Goal: RH STG SKIN FREE OF INFECTION/BREAKDOWN Description: Manage skin free of infection/breakdown with supervision Outcome: Progressing   Problem: RH SAFETY Goal: RH STG ADHERE TO SAFETY PRECAUTIONS W/ASSISTANCE/DEVICE Description: STG Adhere to Safety Precautions With supervision  Assistance/Device. Outcome: Progressing   Problem: RH PAIN MANAGEMENT Goal: RH STG PAIN MANAGED AT OR BELOW PT'S PAIN GOAL Description: <4 w/ prns Outcome: Progressing   Problem: RH KNOWLEDGE DEFICIT GENERAL Goal: RH STG INCREASE KNOWLEDGE OF SELF CARE AFTER HOSPITALIZATION Description: Manage increase knowledge of self care after hospitalization with supervision from mother using educational materials provided Outcome: Progressing

## 2023-08-06 ENCOUNTER — Other Ambulatory Visit: Payer: Self-pay | Admitting: Physician Assistant

## 2023-08-06 NOTE — Progress Notes (Signed)
 Occupational Therapy Session Note  Patient Details  Name: Tiffany Velasquez MRN: [id number] Date of Birth: [birth year]  Today's Date: 08/06/2023 OT Individual Time: 6962-9528 OT Individual Time Calculation (min): 43 min    Short Term Goals: Week 3:  OT Short Term Goal 1 (Week 3): Pt will perform 3/3 toileting tasks with CGA + LRAD. OT Short Term Goal 2 (Week 3): Pt will bathe LB with CGA + LRAD. OT Short Term Goal 3 (Week 3): Pt will perform tub/shower transfer with CGA + LRAD.  Skilled Therapeutic Interventions/Progress Updates:    Pt resting in bed upon arrival and agreeable to therapy. Skilled OT intervention with focus on bed mobiity, functional amb with RW, standing balance, activity tolerance, and safety awareness to increase independence with BADLs. All bed mobility with supervision. Pt donned slippers prior to amb with RW to day room. Standing activity for corn hole and cleaning bean bags. Pt amb with RW and retrieved bean bags from floor using RW. All activity with CGA/close supervision. Pt amb with RW back to room and retunred to EOB. Pt remained seated EOB with all needs within reach.   Therapy Documentation Precautions:  Precautions Precautions: Fall Recall of Precautions/Restrictions: Intact Precaution/Restrictions Comments: 6 falls recently Restrictions Weight Bearing Restrictions Per Provider Order: No    Pain: Pt reports 7/10 generalized pain; meds admin prior to therapy  Therapy/Group: Individual Therapy  Doak Free 08/06/2023, 11:14 AM

## 2023-08-06 NOTE — Progress Notes (Addendum)
 Refused CHG bath at this time. Wanting to complete at another time. Make another attempt and update next nurse assigned to patient did not complete CHG bath this evening. Through the weekend continues to demonstrate avolition and mood is apathetic. Decrease in energy with restricted/flat affect. Making attempts to motivate patient to complete activities, but wanting to rest in bed.

## 2023-08-06 NOTE — Progress Notes (Signed)
 PROGRESS NOTE   Subjective/Complaints:  Pt doing well again today, slept much better last night, tramadol  helping pain greatly. LBM yesterday. Urinating fine. Denies any other complaints or concerns.    ROS: as per HPI. Denies CP, SOB, abd pain, N/V/D, or any other complaints at this time.   + burning pain both feet--improved + Insomnia, improved + Orthostatic hypotension- improved + dysuria- resolved + Anxiety-continued intermittent anxiety  Objective:   No results found.   Recent Labs    08/05/23 0440  WBC 2.7*  HGB 8.7*  HCT 28.1*  PLT 288    Recent Labs    08/05/23 0440  NA 137  K 3.5  CL 113*  CO2 16*  GLUCOSE 92  BUN 10  CREATININE 0.62  CALCIUM  9.1    Intake/Output Summary (Last 24 hours) at 08/06/2023 1141 Last data filed at 08/06/2023 1000 Gross per 24 hour  Intake 630 ml  Output --  Net 630 ml        Physical Exam: Vital Signs Blood pressure 120/64, pulse 78, temperature 98.2 F (36.8 C), temperature source Oral, resp. rate 18, height 5\' 9"  (1.753 m), weight (!) 142.9 kg, last menstrual period 06/27/2023, SpO2 100%.    General: NAD, moving around in bed appears comfortable HEENT: Head is normocephalic, atraumatic, MMM Neck: Supple without JVD or lymphadenopathy Heart: RRR Chest: CTA bilaterally without wheezes, rales, or rhonchi; no distress. Port R chest  Abdomen: Soft, non-tender, non-distended, bowel sounds positive. Extremities: No clubbing, cyanosis, Tr b/l edema.  Psych: less anxious appearing , happier today Skin: Clean and intact without signs of breakdown over exposed surfaces.  Right chest port C-D-I  PRIOR EXAMS: Neuro:  Awake, alert, oriented x 4.  No apparent cognitive deficits. Strength antigravity 4- out of 5 bilateral upper extremities, 4-/5 bilateral hip flexors, 4 out of 5 distal lower extremities. Hypersensitivity to light touch in bilateral  feet/ankles-decreased from prior.  Otherwise, sensation intact No abnormal tone noted   Assessment/Plan: 1. Functional deficits which require 3+ hours per day of interdisciplinary therapy in a comprehensive inpatient rehab setting. Physiatrist is providing close team supervision and 24 hour management of active medical problems listed below. Physiatrist and rehab team continue to assess barriers to discharge/monitor patient progress toward functional and medical goals  Care Tool:  Bathing    Body parts bathed by patient: Right arm, Left arm, Chest, Abdomen, Front perineal area, Buttocks, Right upper leg, Left upper leg, Right lower leg, Left lower leg, Face   Body parts bathed by helper: Buttocks, Right upper leg, Left upper leg, Right lower leg, Left lower leg     Bathing assist Assist Level: Contact Guard/Touching assist     Upper Body Dressing/Undressing Upper body dressing   What is the patient wearing?: Pull over shirt    Upper body assist Assist Level: Supervision/Verbal cueing    Lower Body Dressing/Undressing Lower body dressing    Lower body dressing activity did not occur: Environmental limitations (no clothes available) What is the patient wearing?:  (Shorts)     Lower body assist Assist for lower body dressing: Minimal Assistance - Patient > 75%     Financial trader Activity  did not occur Press photographer and hygiene only): N/A (no void or bm)  Toileting assist Assist for toileting: Independent with assistive device     Transfers Chair/bed transfer  Transfers assist     Chair/bed transfer assist level: Minimal Assistance - Patient > 75%     Locomotion Ambulation   Ambulation assist   Ambulation activity did not occur: Safety/medical concerns  Assist level: 2 helpers Assistive device: Walker-rolling Max distance: 175'   Walk 10 feet activity   Assist  Walk 10 feet activity did not occur: Safety/medical concerns  Assist  level: Minimal Assistance - Patient > 75% Assistive device: Walker-rolling   Walk 50 feet activity   Assist Walk 50 feet with 2 turns activity did not occur: Safety/medical concerns  Assist level: 2 helpers (+2 WC follow) Assistive device: Walker-rolling    Walk 150 feet activity   Assist Walk 150 feet activity did not occur: Safety/medical concerns  Assist level: 2 helpers Assistive device: Walker-rolling    Walk 10 feet on uneven surface  activity   Assist Walk 10 feet on uneven surfaces activity did not occur: Safety/medical concerns         Wheelchair     Assist Is the patient using a wheelchair?: No             Wheelchair 50 feet with 2 turns activity    Assist            Wheelchair 150 feet activity     Assist          Blood pressure 120/64, pulse 78, temperature 98.2 F (36.8 C), temperature source Oral, resp. rate 18, height 5\' 9"  (1.753 m), weight (!) 142.9 kg, last menstrual period 06/27/2023, SpO2 100%.  Medical Problem List and Plan: 1. 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 If unaccessed?             -ELOS/Goals: 2-4 weeks -  min A hopefully             -Continue CIR  - 4/29: Having help from mom/dad at home in discharge. Mod-Max A ADLs with bed-level LB. Biggest barriers are weakness and some self-limiting behaviors. Walked 15 feet with EVA walker. Regular walker STS today.  -Team conference  note completed -Expected DC was adjusted to 5/21  2.  Antithrombotics: -DVT/anticoagulation:  Pharmaceutical: Lovenox  70mg  daily             -antiplatelet therapy: N/A 3. Pain Management: Has been using oxycodone  prn past 3 days? For foot, leg and back pain- also has Gabapentin  800mg  TID (increased 4/25) - duloxetine  stopped  initially due to myositis- 30 mg daily resumed 4/23 -07/23/23 feet very sensitive, but cymbalta  and gabapentin  recently adjusted, lidocaine  cream not enough so asked that pharmacy send 15g tubes per treatment, to adequately cover the plantar surfaces of her feet to see if we can get relief.  4-28: Remains very sensitive, does feel some improvement 4-29: Patient with some daytime lethargy, pain primarily at night.  Reduce daytime gabapentin  to 600 mg every morning/600 mg q. afternoon, increase nighttime dose to 1000 mg.  Increase duloxetine  to 30 mg twice daily. 4-30: Patient did much better overnight with the above changes. 5-1: Discussed with patient further weaning daytime gabapentin ; she feels good on current regimen and wishes to  hold off at this time 5/5 add prn robaxin  for muscle spams 5/6 tramadol  PRN started 5/7 patient reports pain controlled today other than mild headache improving with Tylenol .  Continue to monitor 5/8 Continue current pain regimen.  Do not want to increase gabapentin  due to concerns of sedation.  Could consider trying Lyrica 5/9 patient reports pain is doing better when she keeps her legs warm.  Denies any pain currently or last night.  Continue current regimen.  Qutenza could be option outpatient if her distal neuropathy pain worsens again.  Overall pain has been controlled so we will hold off on adjustments. -08/06/23 pain improved with tramadol  overnight  4. Mood/Behavior/Sleep: LCSW to follow for evaluation and support.              -antipsychotic agents: N/A  -Neuropsych consult  -07/23/23 didn't sleep well, has melatonin PRN to use - 4-28: Sleep interrupted by pain as above.  Increasing gabapentin  and duloxetine ; may benefit from transition to Elavil nightly If no improvement 4-30: Sleeping better.  Thinks melatonin as needed is over sedating, advised that this is per her request.  5. Neuropsych/cognition: This patient is capable of making decisions on her own  behalf. - 4-28: Dr. Cheryll Corti evaluated today; appreciate his professional assessment  6. Skin/Wound Care: Routine pressure relief measures.   - Chest port appears clean, routine dressing changes  7. Fluids/Electrolytes/Nutrition: Monitor I/O. Continue Ensure supplements and vitamin supplementation -07/22/23 hypoK+ 3.4, KCL 40meq po x 1- recheck 4/28 -07/23/23 added labs for tomorrow (Mg/phos, CMP, CK) 4/28: Hypokalemia worsened to 2.9 status post 40 mill equivalents; add 40 mill equivalents KCl PO, +40 mill equivalents IV today.  Repeat BMP tonight and tomorrow a.m. Phos elevated, so we will avoid K-Phos--repeat potassium 3.4 at 6 PM. 4-29: Hypokalemia resolved.  Repeat labs Thursday. 5/1: Potassium back to 2.9.  DC HCTZ.  Add 40 mill equivalents daily p.o. repletion, another 20 mill equivalents IV today.  Repeat labs in AM. 07/29/23 K 3.1 yesterday, will do 20mEq IV once today and continue the 40mEq PO, repeat labs Monday 5/5 K+ up to 3.3, increase to 30meq BID 5/8 K+ up to 3.5 continue supplment, MG low at 1.6, IV Mg 2 g given after discussion with pharmacy.  Recheck tomorrow -08/05/23 Mg 1.9, K 3.5, monitor  8. Non-specific myositis: Cymbalta  d/c by neurology due to concerns of rhabdomyolysis --Cymbalta  30 mg resumed 04/23 given improving myositis.  --Monitor CK intermittently improved from 1536-->514--> 398 --consider muscle biopsy if symptoms do not improve -07/23/23 CK to be done tomorrow and weekly on Mon 4-28: CK downtrending, continue to trend 5/1: CK has normalized. Scheduled for qMon checks-- might consider d/c'ing checks?  9. Multifocal PNA: Likely aspiration--most significant in lingula and RLL on CT w/o contrast chest 07/18/23 --Hx of chronic intermittent N/V. May need swallow evaluation. Swallow precautions added.   --started on Augmentin  04/22-->04/25 for 5 day course.  -No apparent signs of respiratory distress  11. Multiple Vitamin deficiencies: Vitamin B1  250 mg daily  4/22-->4/28 followed by 100 mg daily IV.  --now on B12 IM+PO, IV folate. Vitamin A  10,000 units daily. Vitamin D  50,000/wk, and MVI --recheck Mg, Phos (supplemented)-- ordered for Monday 4/28--Phos improving, magnesium  stable -5/8 MG low, discussed pharm- IV 2g today recheck Saturday-- see above #7  12. Resting tachycardia: HR has been 110-120 range at baseline --not on any BB -Start metoprolol  12.5 mg BID -07/23/23 still tachycardic but less, monitor  - intermittent, asymptomatic. Monitor -08/05/23 improving, monitor  08/06/2023    5:14 AM 08/06/2023    5:00 AM 08/05/2023    7:36 PM  Vitals with BMI  Weight  315 lbs 1 oz   BMI  46.5   Systolic 120  110  Diastolic 64  65  Pulse 78  84     13. Accelerated HTN/orthostatic hypotension: BP poorly controlled. Continue hydrochlorothiazide  12.5mg  daily and Avapro  150mg  daily -- addition of BB? -4/25 BB started as in #12 -07/23/23 BPs better, monitor; of note, pharmacy note states pt was on amlodipine  previously? Remain off for now, but note this for d/c 4/28 : reduce HCTZ to 6.125 due to recurrent hypokalemia--BP stable 5/1: BUN elevated, blood pressure soft, hypokalemia as above; DC hydrochlorothiazide .  Got 500 cc IV fluid bolus this afternoon. 5-2: Remains low, irbesartan  reduced to 75 mg this AM; DC given ongoing symptomatic orthostasis, can consider resumption once BP comes up.  Will give 1 L IV fluids today and patient thinks she can tolerate TED hose now, so we will add this on.  Will get orthostatics after IV fluid. -5/3-4/25 BPs improving; monitor -5/6-8 BP controlled, continue current regimen -5/9 patient felt to have high diastolic BP.  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 Latanoprost  and alphagan  gtts             --IVIG 04/21- 04/24. Followed by Dr. Curley Double. -07/23/23 humira, retinA, and rhopressa  listed in pharmacy note as "resume as needed in CIR or at discharge"; clarify when/if these need to be restarted while here 4-29: Should be OK to resume Retin A and Rhopressa ; will inquire about bringing from home. -- will check with Optho about Cellcept and Humira resumption.  4-30: Family brought Rhopressa  and Humira in from home; pharmacy holding Humira until clarified by Optho.  Rhopressa  resumed. 5-1: Spoke with Dr. Mason Sole, continue to hold Humira and CellCept pending outpatient follow-up with him shortly after discharge.  15. Urinary retention: Purewick being used--monitor voiding with PVR/bladder scan. Toilet every 4 hours  -4/25 PVR 64, continue to monitor   - 4-28: No recent PVRs documented, patient denies incontinence, monitor  16. Chronic Asthma: Continue Dulera  BID and singulair  10mg  nightly, azelastin BID --Followed by Dr. Idolina Maker.   17. H/o chronic gastritis w/nausea and vomiting: D/c Ibuprofen . Continue Protonix  40 mg daily and Pepcid  40mg  nightly; was on reglan  at home, has PRN compazine  here. Carafate  1g BID.  -- Followed by [facility].  No complaints while at rehab  18. Abnormal LFTs: Normal liver parenchyma and GB sludge noted on abdominal ultrasound 07/16/23.  --Resolving. Recheck in am-pending - 07/23/23 LFTs a bit improved 4/26, repeat CMP tomorrow morning; of note, HepB SAb reactive, core Ab positive 4-28: LFTs looking better Recheck monday  19. Peripheral polyneuropathy: Cymbalta  resumed 04/23 as CK trending down. Will check weekly -- See #3 above; may benefit  from transition to Lyrica or trial of Qutenza as outpatient.  Was establishing with Dr. Rayleen Cal  18.  Anemia of chronic disease: Likely malabsorption w/ Low iron but elevated TIBC/Ferritin -07/23/23 Hgb 9.9 on Fri, repeat weekly on Mon/Thurs; iron studies done 4/21 showing low TIBC 172, iron 46, ferritin elevated 791.  -5/8 HGB overall stable 8.5 -08/05/23 Hgb up to 8.7, monitor  19. Morbid obesity: BMI 48. Followed by RD/Bariatric nutrition for wt loss. Plan for Bariatric surgery 08/2023.              --Now on aggressive vitamin supplementation as above  20. Hyponatremia: Na down to 132-->question due to IVIG 4/31-4/24.  --May see rise in LFTs again.  -4/25 recheck labs still pending, will ask nursing to call to check on this -07/23/23 Na 131 on 4/26, recheck tomorrow to see trend or need for supplementation  4/28: NA stable 132. 5/1: Na 134, stable>> 3.1 5/2; monitor  5/10 Stable 137  21. Severe tricuspid regurgitation/grade 1 diastolic heart failure   - 4/29: add daily weights with diuretic adjustments--stable -5/2: Weights remain stable, no external edema with DC hydrochlorothiazide .  Continue to monitor closely. -07/30/23 wt down significantly today, wonder if it's inaccurate; monitor trend -5/9 weight is overall stable, continue current Filed Weights   08/02/23 0500 08/04/23 0652 08/06/23 0500  Weight: (!) 140 kg (!) 138.6 kg (!) 142.9 kg    22. Gout: continue allopurinol  100mg  QD; off colchicine  for now, resume as appropriate   23. Diarrhea/constipation: -07/23/23 pt now having runny stools, thinks it's the food; stop miralax  daily and change to PRN; monitor 4-28: Ongoing liquid stool; encourage p.o. fluids, replete electrolytes as above, and adding fibercon supplemen\t   - 4/29: No Bms today; slowing down. [place] - 4-30: Diarrhea followed by smears, KUB performed to ensure no overflow incontinence, was normal. 5/1: KUB normal, patient feels she is having adequate bowel  movements.   Last bowel movement 5-2, adequate per patient  LBM 5/7, consider additional medication if no BM by tomorrow -08/05/23 LBM 2 days ago per pt, 3 days ago per documentation; will see if she has BM today but if not then will start meds. Encouraged to use miralax  today  -08/06/23 LBM yesterday, monitor 24. Dysuria:  -07/29/23 pt stating some dysuria/difficulty with urination; ordered U/A -07/30/23 U/A not done yesterday d/t BMs everytime she urinated; will try urinal today; f/up on results  -5/5/5 U/A 5/3 with 21-50 WBC, many bacteria start kelfex for suspected UTI, culture added 08/04/23 symptoms have improved, Proteus on urine culture, change to Augmentin  and DC keflex  -08/05/23 got 3.5 days of keflex , so augmentin  through 5/14   25. Anxiety  -xanax  0.25mg  PRN  -5/7 Buspar  daily started 5mg - monitor response  LOS: 17 days A FACE TO FACE EVALUATION WAS PERFORMED  [address] 08/06/2023, 11:41 AM

## 2023-08-06 NOTE — Plan of Care (Signed)
  Problem: Consults Goal: RH GENERAL PATIENT EDUCATION Description: See Patient Education module for education specifics. Outcome: Progressing   Problem: RH BOWEL ELIMINATION Goal: RH STG MANAGE BOWEL WITH ASSISTANCE Description: STG Manage Bowel with supervision Assistance. Outcome: Progressing   Problem: RH BLADDER ELIMINATION Goal: RH STG MANAGE BLADDER WITH ASSISTANCE Description: STG Manage Bladder With supervision Assistance Outcome: Progressing   Problem: RH SKIN INTEGRITY Goal: RH STG SKIN FREE OF INFECTION/BREAKDOWN Description: Manage skin free of infection/breakdown with supervision Outcome: Progressing   Problem: RH SAFETY Goal: RH STG ADHERE TO SAFETY PRECAUTIONS W/ASSISTANCE/DEVICE Description: STG Adhere to Safety Precautions With supervision  Assistance/Device. Outcome: Progressing   Problem: RH PAIN MANAGEMENT Goal: RH STG PAIN MANAGED AT OR BELOW PT'S PAIN GOAL Description: <4 w/ prns Outcome: Progressing

## 2023-08-07 ENCOUNTER — Ambulatory Visit: Admitting: Podiatry

## 2023-08-07 DIAGNOSIS — D62 Acute posthemorrhagic anemia: Secondary | ICD-10-CM

## 2023-08-07 LAB — BASIC METABOLIC PANEL WITH GFR
Anion gap: 7 (ref 5–15)
BUN: 9 mg/dL (ref 6–20)
CO2: 17 mmol/L — ABNORMAL LOW (ref 22–32)
Calcium: 9.1 mg/dL (ref 8.9–10.3)
Chloride: 115 mmol/L — ABNORMAL HIGH (ref 98–111)
Creatinine, Ser: 0.57 mg/dL (ref 0.44–1.00)
GFR, Estimated: >60 mL/min (ref >60)
Glucose, Bld: 93 mg/dL (ref 70–99)
Potassium: 3.6 mmol/L (ref 3.5–5.1)
Sodium: 139 mmol/L (ref 135–145)

## 2023-08-07 LAB — HEPATIC FUNCTION PANEL
ALT: 23 U/L (ref 0–44)
AST: 32 U/L (ref 15–41)
Albumin: 2.4 g/dL — ABNORMAL LOW (ref 3.5–5.0)
Alkaline Phosphatase: 39 U/L (ref 38–126)
Bilirubin, Direct: <0.1 mg/dL (ref 0.0–0.2)
Total Bilirubin: 0.3 mg/dL (ref 0.0–1.2)
Total Protein: 7.3 g/dL (ref 6.5–8.1)

## 2023-08-07 LAB — CBC
HCT: 30.2 % — ABNORMAL LOW (ref 36.0–46.0)
Hemoglobin: 9.3 g/dL — ABNORMAL LOW (ref 12.0–15.0)
MCH: 29.2 pg (ref 26.0–34.0)
MCHC: 30.8 g/dL (ref 30.0–36.0)
MCV: 95 fL (ref 80.0–100.0)
Platelets: 273 10*3/uL (ref 150–400)
RBC: 3.18 MIL/uL — ABNORMAL LOW (ref 3.87–5.11)
RDW: 19.8 % — ABNORMAL HIGH (ref 11.5–15.5)
WBC: 3.5 10*3/uL — ABNORMAL LOW (ref 4.0–10.5)
nRBC: 0 % (ref 0.0–0.2)

## 2023-08-07 LAB — MAGNESIUM: Magnesium: 1.7 mg/dL (ref 1.7–2.4)

## 2023-08-07 LAB — CK: Total CK: 23 U/L — ABNORMAL LOW (ref 38–234)

## 2023-08-07 MED ORDER — MAGNESIUM OXIDE -MG SUPPLEMENT 400 (240 MG) MG PO TABS: 400.0000 mg | ORAL_TABLET | Freq: Every day | ORAL | Status: DC

## 2023-08-07 MED ORDER — MAGNESIUM OXIDE -MG SUPPLEMENT 400 (240 MG) MG PO TABS
200.0000 mg | ORAL_TABLET | Freq: Two times a day (BID) | ORAL | Status: DC
Start: 2023-08-07 — End: 2023-08-07

## 2023-08-07 MED ORDER — MAGNESIUM OXIDE -MG SUPPLEMENT 400 (240 MG) MG PO TABS
400.0000 mg | ORAL_TABLET | Freq: Two times a day (BID) | ORAL | Status: DC
  Administered 2023-08-07 – 2023-08-16 (×18): 400 mg via ORAL
  Filled 2023-08-07 (×18): qty 1

## 2023-08-07 NOTE — Progress Notes (Signed)
 Physical Therapy Session Note  Patient Details  Name: Tiffany Velasquez MRN: [id number] Date of Birth: [birth year]  Today's Date: 08/07/2023 PT Individual Time: 1415-1530 PT Individual Time Calculation (min): 75 min   Short Term Goals: Week 2:  PT Short Term Goal 1 (Week 2): Pt will ambulate with RW 100' with min assist PT Short Term Goal 2 (Week 2): Pt will completes sit<>stands with min assist consistently PT Short Term Goal 3 (Week 2): Pt will complete up/down 4 steps with BHRs min assist  Skilled Therapeutic Interventions/Progress Updates: Pt presents sitting up in bed and agreeable to therapy, family just leaving.  Pt donned crocks w/ set-up.  Pt transfers sit to stand w/ CGA from all surfaces, except from arm chair at end of session 2/2 fatigue.  Pt step-pivoted to TIS w/ min A.  Pt wheeled to main gym.  Pt performed standing hooking horseshoes over BBALL hoop, w/ alternating hands and reaching across midline to challenge balance.  Pt then completed hooking w/ 1 step forward w/ alternating LES.  Pt performed catching/bouncing and throwing ball including overhead and chest pass, then progressing to steps w/ throws.  Pt amb multiple trials w/ RW and CGA x 100', cues for decreasing reliance on UES.  Pt amb x 55' w/o UE support and min A, cues for relaxation and foot clearance.  Pt w/ occasional wobbliness, but no LOB or buckling.  Pt returned to room and amb w/o AD to bed.  Pt remained sitting EOB, all needs in reach.     Therapy Documentation Precautions:  Precautions Precautions: Fall Recall of Precautions/Restrictions: Intact Precaution/Restrictions Comments: 6 falls recently Restrictions Weight Bearing Restrictions Per Provider Order: No General:   Vital Signs:   Pain:4/10     Therapy/Group: Individual Therapy  Albena Comes P Colton Tassin 08/07/2023, 3:45 PM

## 2023-08-07 NOTE — Progress Notes (Signed)
 Patient ID: Tiffany Velasquez, female   DOB: [birth year], 28 y.o.   MRN: [id number]  SW met with pt about scheduling family edu this week. She will discuss with her mother. SW will follow-up about date and time.  Norval Been, MSW, LCSW Office: [phone] Cell: [phone] Fax: [phone]

## 2023-08-07 NOTE — Progress Notes (Signed)
 PROGRESS NOTE   Subjective/Complaints:  Patient reports doing well overall.  She does use tramadol  at night and Robaxin  during the day to help manage her pain.  No additional concerns or complaints   ROS: as per HPI. Denies CP, SOB, abd pain, N/V/D, or any other complaints at this time.   + burning pain both feet--improved, this is worse at night + Insomnia, improved + Orthostatic hypotension- improved + dysuria- resolved + Anxiety-continued intermittent anxiety  Objective:   No results found.   Recent Labs    08/05/23 0440 08/07/23 0315  WBC 2.7* 3.5*  HGB 8.7* 9.3*  HCT 28.1* 30.2*  PLT 288 273    Recent Labs    08/05/23 0440 08/07/23 0315  NA 137 139  K 3.5 3.6  CL 113* 115*  CO2 16* 17*  GLUCOSE 92 93  BUN 10 9  CREATININE 0.62 0.57  CALCIUM  9.1 9.1    Intake/Output Summary (Last 24 hours) at 08/07/2023 1649 Last data filed at 08/07/2023 1400 Gross per 24 hour  Intake 520 ml  Output --  Net 520 ml        Physical Exam: Vital Signs Blood pressure 108/84, pulse 78, temperature 97.8 F (36.6 C), temperature source Oral, resp. rate 18, height 5\' 9"  (1.753 m), weight (!) 144 kg, last menstrual period 06/27/2023, SpO2 100%.    General: NAD, moving around in bed appears comfortable HEENT: Head is normocephalic, atraumatic, MMM Neck: Supple without JVD or lymphadenopathy Heart: RRR Chest: CTA bilaterally without wheezes, rales, or rhonchi; no distress. Port R chest  Abdomen: Soft, non-tender, non-distended, normoactive bowel sounds Extremities: No clubbing, cyanosis, Tr b/l edema.  Psych: Appropriate, cooperative Skin: Clean and intact without signs of breakdown over exposed surfaces.  Right chest port C-D-I  Neuro:  Awake, alert, oriented x 4.  No apparent cognitive deficits. Strength antigravity 4- out of 5 bilateral upper extremities, 4-/5 bilateral hip flexors, 4 out of 5 distal lower  extremities. Hypersensitivity to light touch in bilateral feet/ankles-decreased from prior.  Otherwise, sensation intact No abnormal tone noted   Assessment/Plan: 1. Functional deficits which require 3+ hours per day of interdisciplinary therapy in a comprehensive inpatient rehab setting. Physiatrist is providing close team supervision and 24 hour management of active medical problems listed below. Physiatrist and rehab team continue to assess barriers to discharge/monitor patient progress toward functional and medical goals  Care Tool:  Bathing    Body parts bathed by patient: Right arm, Left arm, Chest, Abdomen, Front perineal area, Buttocks, Right upper leg, Left upper leg, Right lower leg, Left lower leg, Face   Body parts bathed by helper: Buttocks, Right upper leg, Left upper leg, Right lower leg, Left lower leg     Bathing assist Assist Level: Contact Guard/Touching assist     Upper Body Dressing/Undressing Upper body dressing   What is the patient wearing?: Pull over shirt    Upper body assist Assist Level: Supervision/Verbal cueing    Lower Body Dressing/Undressing Lower body dressing    Lower body dressing activity did not occur: Environmental limitations (no clothes available) What is the patient wearing?:  (Shorts)     Lower body assist Assist  for lower body dressing: Minimal Assistance - Patient > 75%     Toileting Toileting Toileting Activity did not occur Press photographer and hygiene only): N/A (no void or bm)  Toileting assist Assist for toileting: Independent with assistive device     Transfers Chair/bed transfer  Transfers assist     Chair/bed transfer assist level: Minimal Assistance - Patient > 75%     Locomotion Ambulation   Ambulation assist   Ambulation activity did not occur: Safety/medical concerns  Assist level: Contact Guard/Touching assist Assistive device: Walker-rolling Max distance: 100   Walk 10 feet  activity   Assist  Walk 10 feet activity did not occur: Safety/medical concerns  Assist level: Contact Guard/Touching assist Assistive device: Walker-rolling   Walk 50 feet activity   Assist Walk 50 feet with 2 turns activity did not occur: Safety/medical concerns  Assist level: Contact Guard/Touching assist Assistive device: Walker-rolling    Walk 150 feet activity   Assist Walk 150 feet activity did not occur: Safety/medical concerns  Assist level: 2 helpers Assistive device: Walker-rolling    Walk 10 feet on uneven surface  activity   Assist Walk 10 feet on uneven surfaces activity did not occur: Safety/medical concerns         Wheelchair     Assist Is the patient using a wheelchair?: No             Wheelchair 50 feet with 2 turns activity    Assist            Wheelchair 150 feet activity     Assist          Blood pressure 108/84, pulse 78, temperature 97.8 F (36.6 C), temperature source Oral, resp. rate 18, height 5\' 9"  (1.753 m), weight (!) 144 kg, last menstrual period 06/27/2023, SpO2 100%.  Medical Problem List and Plan: 1. 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 If unaccessed?             -ELOS/Goals: 2-4 weeks -  min A hopefully             -Continue CIR  - 4/29: Having help from mom/dad at home in discharge. Mod-Max A ADLs with bed-level LB. Biggest barriers are weakness and some self-limiting behaviors. Walked 15 feet with EVA walker. Regular walker STS today.  -Team conference  note completed -Expected DC was adjusted to 5/21  2.  Antithrombotics: -DVT/anticoagulation:  Pharmaceutical: Lovenox  70mg  daily             -antiplatelet therapy: N/A 3. Pain Management: Has been using oxycodone  prn past 3 days? For foot,  leg and back pain- also has Gabapentin  800mg  TID (increased 4/25) - duloxetine  stopped initially due to myositis- 30 mg daily resumed 4/23 -07/23/23 feet very sensitive, but cymbalta  and gabapentin  recently adjusted, lidocaine  cream not enough so asked that pharmacy send 15g tubes per treatment, to adequately cover the plantar surfaces of her feet to see if we can get relief.  4-28: Remains very sensitive, does feel some improvement 4-29: Patient with some daytime lethargy, pain primarily at night.  Reduce daytime gabapentin  to 600 mg every morning/600 mg q. afternoon, increase nighttime dose to 1000 mg.  Increase duloxetine  to 30 mg twice daily. 4-30: Patient did much better overnight with the above changes. 5-1: Discussed with  patient further weaning daytime gabapentin ; she feels good on current regimen and wishes to hold off at this time 5/5 add prn robaxin  for muscle spams 5/6 tramadol  PRN started 5/7 patient reports pain controlled today other than mild headache improving with Tylenol .  Continue to monitor 5/8 Continue current pain regimen.  Do not want to increase gabapentin  due to concerns of sedation.  Could consider trying Lyrica 5/9 patient reports pain is doing better when she keeps her legs warm.  Denies any pain currently or last night.  Continue current regimen.  Qutenza could be option outpatient if her distal neuropathy pain worsens again.  Overall pain has been controlled so we will hold off on adjustments. -08/06/23 pain improved with tramadol  overnight  5/12 pain controlled with tramadol , Robaxin  and gabapentin  current dose.  Continue to monitor  4. Mood/Behavior/Sleep: LCSW to follow for evaluation and support.              -antipsychotic agents: N/A  -Neuropsych consult  -07/23/23 didn't sleep well, has melatonin PRN to use - 4-28: Sleep interrupted by pain as above.  Increasing gabapentin  and duloxetine ; may benefit from transition to Elavil nightly If no improvement 4-30:  Sleeping better.  Thinks melatonin as needed is over sedating, advised that this is per her request.  5. Neuropsych/cognition: This patient is capable of making decisions on her own behalf. - 4-28: Dr. Cheryll Corti evaluated today; appreciate his professional assessment  6. Skin/Wound Care: Routine pressure relief measures.   - Chest port appears clean, routine dressing changes  7. Fluids/Electrolytes/Nutrition: Monitor I/O. Continue Ensure supplements and vitamin supplementation -07/22/23 hypoK+ 3.4, KCL 40meq po x 1- recheck 4/28 -07/23/23 added labs for tomorrow (Mg/phos, CMP, CK) 4/28: Hypokalemia worsened to 2.9 status post 40 mill equivalents; add 40 mill equivalents KCl PO, +40 mill equivalents IV today.  Repeat BMP tonight and tomorrow a.m. Phos elevated, so we will avoid K-Phos--repeat potassium 3.4 at 6 PM. 4-29: Hypokalemia resolved.  Repeat labs Thursday. 5/1: Potassium back to 2.9.  DC HCTZ.  Add 40 mill equivalents daily p.o. repletion, another 20 mill equivalents IV today.  Repeat labs in AM. 07/29/23 K 3.1 yesterday, will do 20mEq IV once today and continue the 40mEq PO, repeat labs Monday 5/5 K+ up to 3.3, increase to 30meq BID 5/8 K+ up to 3.5 continue supplment, MG low at 1.6, IV Mg 2 g given after discussion with pharmacy.  Recheck tomorrow -08/05/23 Mg 1.9, K 3.5, monitor -5/11 add mg oxide 400mg  BIDsupplement  8. Non-specific myositis: Cymbalta  d/c by neurology due to concerns of rhabdomyolysis --Cymbalta  30 mg resumed 04/23 given improving myositis.  --Monitor CK intermittently improved from 1536-->514--> 398 --consider muscle biopsy if symptoms do not improve -07/23/23 CK to be done tomorrow and weekly on Mon 4-28: CK downtrending, continue to trend 5/1: CK has normalized. Scheduled for qMon checks-- might consider d/c'ing checks?  9. Multifocal PNA: Likely aspiration--most significant in lingula and RLL on CT w/o contrast chest 07/18/23 --Hx of chronic intermittent N/V.  May need swallow evaluation. Swallow precautions added.   --started on Augmentin  04/22-->04/25 for 5 day course.  -No apparent signs of respiratory distress  11. Multiple Vitamin deficiencies: Vitamin B1  250 mg daily 4/22-->4/28 followed by 100 mg daily IV.  --now on B12 IM+PO, IV folate. Vitamin A  10,000 units daily. Vitamin D  50,000/wk, and MVI --recheck Mg, Phos (supplemented)-- ordered for Monday 4/28--Phos improving, magnesium  stable -5/8 MG low, discussed pharm- IV 2g today recheck Saturday-- see above #7 -  5/9-5/11 add mg oxide 400mg  BID supplement  12. Resting tachycardia: HR has been 110-120 range at baseline --not on any BB -Start metoprolol  12.5 mg BID -07/23/23 still tachycardic but less, monitor  - intermittent, asymptomatic. Monitor -5/12 HR stable, continue to monitor       08/07/2023    4:10 PM 08/07/2023    3:59 PM 08/07/2023    5:23 AM  Vitals with BMI  Weight 317 lbs 7 oz    BMI 46.86    Systolic  108 110  Diastolic  84 74  Pulse  78 78     13. Accelerated HTN/orthostatic hypotension: BP poorly controlled. Continue hydrochlorothiazide  12.5mg  daily and Avapro  150mg  daily -- addition of BB? -4/25 BB started as in #12 -07/23/23 BPs better, monitor; of note, pharmacy note states pt was on amlodipine  previously? Remain off for now, but note this for d/c 4/28 : reduce HCTZ to 6.125 due to recurrent hypokalemia--BP stable 5/1: BUN elevated, blood pressure soft, hypokalemia as above; DC hydrochlorothiazide .  Got 500 cc IV fluid bolus this afternoon. 5-2: Remains low, irbesartan  reduced to 75 mg this AM; DC given ongoing symptomatic orthostasis, can consider resumption once BP comes up.  Will give 1 L IV fluids today and patient thinks she can tolerate TED hose now, so we will add this on.  Will get orthostatics after IV fluid. -5/3-4/25 BPs improving; monitor -5/6-8 BP controlled, continue current regimen -5/9 patient felt to have high diastolic BP.  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 Latanoprost  and alphagan  gtts             --IVIG 04/21- 04/24. Followed by Dr. Curley Double. -07/23/23 humira, retinA, and rhopressa  listed in pharmacy note as "resume as needed in CIR or at discharge"; clarify when/if these need to be restarted while here 4-29: Should be OK to resume Retin A and Rhopressa ; will inquire about bringing from home. -- will check with Optho about Cellcept and Humira resumption.  4-30: Family brought Rhopressa  and Humira in from home; pharmacy holding Humira until clarified by Optho.  Rhopressa  resumed. 5-1: Spoke with Dr. Mason Sole, continue to hold Humira and CellCept pending outpatient follow-up with him shortly after discharge.  15. Urinary retention: Purewick being used--monitor voiding with PVR/bladder scan. Toilet every 4 hours  -4/25 PVR 64, continue to monitor   - 4-28: No recent PVRs documented, patient denies incontinence, monitor  16. Chronic Asthma: Continue Dulera  BID and singulair  10mg  nightly, azelastin BID --Followed by Dr. Idolina Maker.   17. H/o chronic gastritis w/nausea and vomiting: D/c Ibuprofen . Continue Protonix  40 mg daily and Pepcid  40mg  nightly; was on reglan  at home, has PRN compazine  here. Carafate  1g BID.  -- Followed by [facility].  No complaints while at rehab  18. Abnormal LFTs: Normal liver parenchyma and GB sludge noted on abdominal ultrasound 07/16/23.  --Resolving. Recheck in am-pending - 07/23/23 LFTs  a bit improved 4/26, repeat CMP tomorrow morning; of note, HepB SAb reactive,  core Ab positive 4-28: LFTs looking better 4/12 improved  19. Peripheral polyneuropathy: Cymbalta  resumed 04/23 as CK trending down. Will check weekly -- See #3 above; may benefit from transition to Lyrica or trial of Qutenza as outpatient.  Was establishing with Dr. Rayleen Cal  18. Anemia of chronic disease: Likely malabsorption w/ Low iron but elevated TIBC/Ferritin -07/23/23 Hgb 9.9 on Fri, repeat weekly on Mon/Thurs; iron studies done 4/21 showing low TIBC 172, iron 46, ferritin elevated 791.  -5/8 HGB overall stable 8.5 -5/12 hgb up to 9.3, monitor   19. Morbid obesity: BMI 48. Followed by RD/Bariatric nutrition for wt loss. Plan for Bariatric surgery 08/2023.              --Now on aggressive vitamin supplementation as above  20. Hyponatremia: Na down to 132-->question due to IVIG 4/31-4/24.  --May see rise in LFTs again.  -4/25 recheck labs still pending, will ask nursing to call to check on this -07/23/23 Na 131 on 4/26, recheck tomorrow to see trend or need for supplementation  4/28: NA stable 132. 5/1: Na 134, stable>> 3.1 5/2; monitor  5/12 stable at 139  21. Severe tricuspid regurgitation/grade 1 diastolic heart failure   - 4/29: add daily weights with diuretic adjustments--stable -5/2: Weights remain stable, no external edema with DC hydrochlorothiazide .  Continue to monitor closely. -07/30/23 wt down significantly today, wonder if it's inaccurate; monitor trend -5/9 weight is overall stable, continue current Filed Weights   08/06/23 0500 08/07/23 0500 08/07/23 1610  Weight: (!) 142.9 kg 63.5 kg (!) 144 kg    22. Gout: continue allopurinol  100mg  QD; off colchicine  for now, resume as appropriate   23. Diarrhea/constipation: -07/23/23 pt now having runny stools, thinks it's the food; stop miralax  daily and change to PRN; monitor 4-28: Ongoing liquid stool; encourage p.o. fluids, replete electrolytes  as above, and adding fibercon supplemen\t   - 4/29: No Bms today; slowing down. [place] - 4-30: Diarrhea followed by smears, KUB performed to ensure no overflow incontinence, was normal. 5/1: KUB normal, patient feels she is having adequate bowel movements.   Last bowel movement 5-2, adequate per patient  LBM 5/7, consider additional medication if no BM by tomorrow -08/05/23 LBM 2 days ago per pt, 3 days ago per documentation; will see if she has BM today but if not then will start meds. Encouraged to use miralax  today  -08/07/23 LBM yesterday, monitor 24. Dysuria:  -07/29/23 pt stating some dysuria/difficulty with urination; ordered U/A -07/30/23 U/A not done yesterday d/t BMs everytime she urinated; will try urinal today; f/up on results  -5/5/5 U/A 5/3 with 21-50 WBC, many bacteria start kelfex for suspected UTI, culture added 08/04/23 symptoms have improved, Proteus on urine culture, change to Augmentin  and DC keflex  -08/05/23 got 3.5 days of keflex , so augmentin  through 5/14   25. Anxiety  -xanax  0.25mg  PRN  -5/7 Buspar  daily started 5mg - monitor response  LOS: 18 days A FACE TO FACE EVALUATION WAS PERFORMED  Lylia Sand 08/07/2023, 4:49 PM

## 2023-08-07 NOTE — Progress Notes (Signed)
 Occupational Therapy Session Note  Patient Details  Name: Tiffany Velasquez MRN: [id number] Date of Birth: April 09, 1995  Today's Date: 08/07/2023 Session 1 OT Individual Time: 4098-1191 OT Individual Time Calculation (min): 60 min   Session 2 OT Individual Time: 1030-1130 OT Individual Time Calculation (min): 60 min    Short Term Goals: Week 3:  OT Short Term Goal 1 (Week 3): Pt will perform 3/3 toileting tasks with CGA + LRAD. OT Short Term Goal 2 (Week 3): Pt will bathe LB with CGA + LRAD. OT Short Term Goal 3 (Week 3): Pt will perform tub/shower transfer with CGA + LRAD.  Skilled Therapeutic Interventions/Progress Updates:  Session 1   Pt greeted semi-reclined in bed awake and agreeable to OT treatment session. Pt reported need to go to the bathroom. Pt ambulated to bathroom w/ RW and CGA. Pt voided bladder and completed 3/3 toileting steps with CGA. Pt then ambulated to the sink with RW and CGA. Pt completed grooming tasks in standing with CGA. Pt completed bathing tasks sit<>stand from wc at the sink with overall supervision. Pt then ambulated to EOB with Min HHA. She completed LB dressing using the bed to prop feet up on and lotioned legs and arms. Discussed OT goals for this week and dc plan. Pt left semi-reclined in bed with bed alarm on, call bell in reach, and needs met.  Pain:  T reports neuropathy in B LE's, rest and repositioned  Session 2 Pt greeted semi-reclined in bed agreeable to OT Treatment session. Pt tearful missing family and her dog. OT provided emotional support and encouragement. Pt ambulated to therapy gym with RW and CGA. UB there-ex in standing with 3 sets of 10 chest press, bicep curl, and standing row using 2 lb weighted bar. 3 sets of 10 seated curl to press with 2 lb single weights. Pt brought to tub room in wc and educated on tub bench transfer. We did not  have time to practice the transfer, but OT educated and demonstrated. Pt returned to room in wc and  ambulated 10 feet back to bed. Pt left semi-reclined in bed with needs met.    Therapy Documentation Precautions:  Precautions Precautions: Fall Recall of Precautions/Restrictions: Intact Precaution/Restrictions Comments: 6 falls recently Restrictions Weight Bearing Restrictions Per Provider Order: No Pain:  T reports neuropathy in B LE's, rest and repositioned   Therapy/Group: Individual Therapy  Lethia Raveling 08/07/2023, 11:50 AM

## 2023-08-07 NOTE — Progress Notes (Signed)
Patient refused CHG bath despite education.

## 2023-08-08 ENCOUNTER — Encounter: Payer: Self-pay | Admitting: Family Medicine

## 2023-08-08 ENCOUNTER — Ambulatory Visit: Admitting: Podiatry

## 2023-08-08 NOTE — Patient Care Conference (Signed)
 Inpatient RehabilitationTeam Conference and Plan of Care Update Date: 08/08/2023   Time: 1032 am    Patient Name: Tiffany Velasquez      Medical Record Number: [id number]  Date of Birth: February 21, 1996 Sex: Female         Room/Bed: 4W14C/4W14C-01 Payor Info: Payor: [employer] MEDICARE / Plan: [employer] MEDICARE HMO / Product Type: *No Product type* /    Admit Date/Time:  07/20/2023 10:06 PM  Primary Diagnosis:  Myositis associated antibody positive  Hospital Problems: Principal Problem:   Myositis associated antibody positive Active Problems:   Myositis   Adjustment disorder with mixed anxiety and depressed mood    Expected Discharge Date: Expected Discharge Date: 08/16/23  Team Members Present: Physician leading conference: Dr. Abelino Able Social Worker Present: Norval Been, LCSW Nurse Present: Jerene Monks, RN PT Present: Crist Dominion, PT OT Present: Artemus Biles, OT     Current Status/Progress Goal Weekly Team Focus  Bowel/Bladder   Patient is continent x2. Last BM 5/10.   Patient to have bowel movement in 3 days or less.   Monitor for changes in bladder and bowe function.    Swallow/Nutrition/ Hydration               ADL's   Setup/supervision for UB care; Min A for LB care/toileting. Barriers: Fluctuating BP (increased diastolic), generalized weakness, decreased activity tolerance, increased anxiety/fear.   Upgraded goals to supervision   LB dressing, standing balance for LB care, general conditioning    Mobility   modI bed mobility, CGA transfers, CGA 150' with RW, min assist 4 steps BHRs   upgraded to supervision  barriers: endurance, strength; focus on transfers, gait, endurance, balance    Communication                Safety/Cognition/ Behavioral Observations               Pain   Patient c/o pain to bilateral feet. Patient will ask for tramadol  prn. On scheduled lidocaine  to feet.   Pain less than or equal to 2.   Assess pain q shift  prn.    Skin   Skin is intact.   No skin breakdown.  Monitor skin q shift and prn.      Discharge Planning:  Pt will d/c to home with her parents. Support from various family members. [facility]- [facility] will need resumption orders. SW will confirm there are no barriers to discharge.    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 Patient has pain, tachycardia: medication adjusted by MD. Patient limited by  generalized weakness, decreased activity tolerance, fear of falling and anxiety.    Patient on target to meet rehab goals: yes, Patient requires supervision with upper body ADLs and minimal assistance with lower body ADLs. Patient requires CGA with transfers and able to ambulate up to 150' with RW. Overall goals at discharge are set for supervision.  See Care Plan and progress notes for long and short-term goals.   Revisions to Treatment Plan:   Neuropsych consult  Teaching Needs: Safety, medications, toileting, transfers, dietary modifications,etc    Current Barriers to Discharge: Decreased caregiver support and Weight  Possible Resolutions to Barriers: Family Education Outpatient follow-up DME: TTB/ Psychologist, clinical Bariatric RW     Medical Summary Current Status: Myositis, neuropathy, anxiety, low MG, UTI, pain, obesity, anemia  Barriers to Discharge: Behavior/Mood;Electrolyte abnormality;Morbid Obesity;Medical stability;Self-care education;Hypotension  Barriers to Discharge Comments: Myositis,  neuropathy, anxiety, low MG, UTI, pain, obesity, anemia Possible Resolutions to [employer] Focus: Monitor BMP, Mg oxide/monitor MG, Continue tramadol /gabapentin , monitor BP and adjust medications as needed   Continued Need for Acute Rehabilitation Level of Care: The patient requires daily medical management by a physician with specialized training in physical medicine and rehabilitation for the  following reasons: Direction of a multidisciplinary physical rehabilitation program to maximize functional independence : Yes Medical management of patient stability for increased activity during participation in an intensive rehabilitation regime.: Yes Analysis of laboratory values and/or radiology reports with any subsequent need for medication adjustment and/or medical intervention. : Yes   I attest that I was present, lead the team conference, and concur with the assessment and plan of the team.   Jerene Monks 08/08/2023, 1032 am

## 2023-08-08 NOTE — Progress Notes (Signed)
 Physical Therapy Weekly Progress Note  Patient Details  Name: Tiffany Velasquez MRN: [id number] Date of Birth: 02-12-1996  Beginning of progress report period: Jul 31, 2023 End of progress report period: Aug 08, 2023  Today's Date: 08/08/2023 PT Individual Time: 3220-2542 + [id number] PT Individual Time Calculation (min): 41 min + 26 min  Patient has met 3 of 3 short term goals.  Pt has made excellent progress towards functional goals. Pt is currently modI for bed mobility, CGA for transfers and ambulation with RW/rollator 170', CGA for stair negotiation 4 steps. Pt will benefit from family education prior to DC.  Patient continues to demonstrate the following deficits muscle weakness, decreased cardiorespiratoy endurance, impaired timing and sequencing, abnormal tone, and decreased coordination, and decreased standing balance, decreased postural control, and decreased balance strategies and therefore will continue to benefit from skilled PT intervention to increase functional independence with mobility.  Patient progressing toward long term goals..  Continue plan of care.  PT Short Term Goals Week 2:  PT Short Term Goal 1 (Week 2): Pt will ambulate with RW 100' with min assist PT Short Term Goal 1 - Progress (Week 2): Met PT Short Term Goal 2 (Week 2): Pt will completes sit<>stands with min assist consistently PT Short Term Goal 2 - Progress (Week 2): Met PT Short Term Goal 3 (Week 2): Pt will complete up/down 4 steps with BHRs min assist PT Short Term Goal 3 - Progress (Week 2): Met Week 3:  PT Short Term Goal 1 (Week 3): STG = LTG due to ELOS  Skilled Therapeutic Interventions/Progress Updates:  SESSION 1: Pt presents in room seated EOB, agreeable to PT. Pt denies pain at this time. Session focused on therapeutic activities for upright mobility tolerance, NMR for single limb stability for gait and stair negotiation. Pt completes transfers with CGA with and without RW throughout session. Pt  ambulates with RW with CGA 170'x2 at beginning and end of session with pt require x1 standing rest break, completed for improving upright endurance needed for progressing community ambulation. Pt completes up/down 4 steps with BHRs with CGA, 2nd person stand by as needed however pt with good stability throughout with no instance of buckling noted. Pt requires extended seated rest breaks following gait and stair negotiation trials. Pt then completes NMR for single limb stability and dynamic standing balance completing step taps to 4" step with CGA/min assist for task without UE support. Pt returns to room and remains seated EOB with all needs within reach, cal light in place at end of session.   SESSION 2: Pt presents in room seated EOB, agreeable to PT, reporting fatigue and some dull aching pain in BLEs at this time, declines intervention. Session focused on therapeutic activities for upright tolerance with rollator as well as NMR for single limb stability and multidirectional stepping stability without UE support. Pt completes transfers with CGA throughout session, requires multiple attempts to stand from EOB at start of session but without difficulty for remainder of session. Pt ambulates with rollator with CGA from room to main gym ~150', comes to sitting on EOM. Pt requires seated rest break following gait trial. Pt then completes NMR including side stepping 3x8' with CGA, and forward step and back x10 BLE with CGA. Pt remains seated on EOM to await follow up OT session with OT notified of pt position at end of session.   Ambulation/gait training;Community reintegration;Neuromuscular re-education;Stair training;UE/LE Strength taining/ROM;Wheelchair propulsion/positioning;Therapeutic Activities;UE/LE Coordination activities;Discharge planning;Functional mobility training;Therapeutic Exercise;Patient/family education;Balance/vestibular training;Disease management/prevention;Pain  management;DME/adaptive  equipment instruction;Cognitive remediation/compensation;Psychosocial support   Therapy Documentation Precautions:  Precautions Precautions: Fall Recall of Precautions/Restrictions: Intact Precaution/Restrictions Comments: 6 falls recently Restrictions Weight Bearing Restrictions Per Provider Order: No   Therapy/Group: Individual Therapy  Annia Kilts PT, DPT 08/08/2023, 4:07 PM

## 2023-08-08 NOTE — Plan of Care (Signed)
  Problem: Consults Goal: RH GENERAL PATIENT EDUCATION Description: See Patient Education module for education specifics. Outcome: Progressing   Problem: RH BOWEL ELIMINATION Goal: RH STG MANAGE BOWEL WITH ASSISTANCE Description: STG Manage Bowel with supervision Assistance. Outcome: Progressing   Problem: RH BLADDER ELIMINATION Goal: RH STG MANAGE BLADDER WITH ASSISTANCE Description: STG Manage Bladder With supervision Assistance Outcome: Progressing   Problem: RH SKIN INTEGRITY Goal: RH STG SKIN FREE OF INFECTION/BREAKDOWN Description: Manage skin free of infection/breakdown with supervision Outcome: Progressing   Problem: RH SAFETY Goal: RH STG ADHERE TO SAFETY PRECAUTIONS W/ASSISTANCE/DEVICE Description: STG Adhere to Safety Precautions With supervision  Assistance/Device. Outcome: Progressing   Problem: RH PAIN MANAGEMENT Goal: RH STG PAIN MANAGED AT OR BELOW PT'S PAIN GOAL Description: <4 w/ prns Outcome: Progressing   Problem: RH KNOWLEDGE DEFICIT GENERAL Goal: RH STG INCREASE KNOWLEDGE OF SELF CARE AFTER HOSPITALIZATION Description: Manage increase knowledge of self care after hospitalization with supervision from mother using educational materials provided Outcome: Progressing

## 2023-08-08 NOTE — Progress Notes (Addendum)
 PROGRESS NOTE   Subjective/Complaints:  Pt working with therapy this am. Pain controlled. She feels like she is getting stronger. Hypotension symptoms improved.   ROS: as per HPI. Denies CP, SOB, abd pain, N/V/D, or any other complaints at this time.   + burning pain both feet--improved, this is worse at night + Insomnia, improved + Orthostatic hypotension- improved + dysuria- resolved + Anxiety-continued intermittent anxiety-little better  Objective:   No results found.   Recent Labs    08/07/23 0315  WBC 3.5*  HGB 9.3*  HCT 30.2*  PLT 273    Recent Labs    08/07/23 0315  NA 139  K 3.6  CL 115*  CO2 17*  GLUCOSE 93  BUN 9  CREATININE 0.57  CALCIUM  9.1    Intake/Output Summary (Last 24 hours) at 08/08/2023 0853 Last data filed at 08/08/2023 0829 Gross per 24 hour  Intake 756 ml  Output --  Net 756 ml        Physical Exam: Vital Signs Blood pressure (!) 136/98, pulse 88, temperature 98.6 F (37 C), temperature source Oral, resp. rate 17, height 5\' 9"  (1.753 m), weight (!) 144 kg, last menstrual period 06/27/2023, SpO2 100%.    General: NAD, working with therapy at edge of bed HEENT: Head is normocephalic, atraumatic, MMM Neck: Supple without JVD or lymphadenopathy Heart: RRR Chest: CTA bilaterally without wheezes, rales, or rhonchi; no distress. Port R chest  Abdomen: Soft, non-tender, non-distended, normoactive bowel sounds Extremities: No clubbing, cyanosis, Tr b/l edema.  Psych: Appropriate, cooperative Skin: Clean and intact without signs of breakdown over exposed surfaces.  Right chest port C-D-I  Neuro:  Awake, alert, oriented x 4.  No apparent cognitive deficits. Strength antigravity 4- out of 5 bilateral upper extremities, 4-/5 bilateral hip flexors, 4 out of 5 distal lower extremities. Hypersensitivity to light touch in bilateral feet/ankles-decreased from prior.  Otherwise,  sensation intact No abnormal tone noted   Assessment/Plan: 1. Functional deficits which require 3+ hours per day of interdisciplinary therapy in a comprehensive inpatient rehab setting. Physiatrist is providing close team supervision and 24 hour management of active medical problems listed below. Physiatrist and rehab team continue to assess barriers to discharge/monitor patient progress toward functional and medical goals  Care Tool:  Bathing    Body parts bathed by patient: Right arm, Left arm, Chest, Abdomen, Front perineal area, Buttocks, Right upper leg, Left upper leg, Right lower leg, Left lower leg, Face   Body parts bathed by helper: Buttocks, Right upper leg, Left upper leg, Right lower leg, Left lower leg     Bathing assist Assist Level: Contact Guard/Touching assist     Upper Body Dressing/Undressing Upper body dressing   What is the patient wearing?: Pull over shirt    Upper body assist Assist Level: Supervision/Verbal cueing    Lower Body Dressing/Undressing Lower body dressing    Lower body dressing activity did not occur: Environmental limitations (no clothes available) What is the patient wearing?:  (Shorts)     Lower body assist Assist for lower body dressing: Minimal Assistance - Patient > 75%     Toileting Toileting Toileting Activity did not occur Press photographer  and hygiene only): N/A (no void or bm)  Toileting assist Assist for toileting: Independent with assistive device     Transfers Chair/bed transfer  Transfers assist     Chair/bed transfer assist level: Minimal Assistance - Patient > 75%     Locomotion Ambulation   Ambulation assist   Ambulation activity did not occur: Safety/medical concerns  Assist level: Contact Guard/Touching assist Assistive device: Walker-rolling Max distance: 100   Walk 10 feet activity   Assist  Walk 10 feet activity did not occur: Safety/medical concerns  Assist level: Contact  Guard/Touching assist Assistive device: Walker-rolling   Walk 50 feet activity   Assist Walk 50 feet with 2 turns activity did not occur: Safety/medical concerns  Assist level: Contact Guard/Touching assist Assistive device: Walker-rolling    Walk 150 feet activity   Assist Walk 150 feet activity did not occur: Safety/medical concerns  Assist level: 2 helpers Assistive device: Walker-rolling    Walk 10 feet on uneven surface  activity   Assist Walk 10 feet on uneven surfaces activity did not occur: Safety/medical concerns         Wheelchair     Assist Is the patient using a wheelchair?: No             Wheelchair 50 feet with 2 turns activity    Assist            Wheelchair 150 feet activity     Assist          Blood pressure (!) 136/98, pulse 88, temperature 98.6 F (37 C), temperature source Oral, resp. rate 17, height 5\' 9"  (1.753 m), weight (!) 144 kg, last menstrual period 06/27/2023, SpO2 100%.  Medical Problem List and Plan: 1. 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 If unaccessed?             -ELOS/Goals: 2-4 weeks -  min A hopefully             -Continue CIR  - 4/29: Having help from mom/dad at home in discharge. Mod-Max A ADLs with bed-level LB. Biggest barriers are weakness and some self-limiting behaviors. Walked 15 feet with EVA walker. Regular walker STS today.  -Expected DC was adjusted to 5/21 -Team conference  note completed 5/13  2.  Antithrombotics: -DVT/anticoagulation:  Pharmaceutical: Lovenox  70mg  daily             -antiplatelet therapy: N/A 3. Pain Management: Has been using oxycodone  prn past 3 days? For foot, leg and back pain- also has Gabapentin  800mg  TID (increased 4/25) - duloxetine  stopped initially due  to myositis- 30 mg daily resumed 4/23 -07/23/23 feet very sensitive, but cymbalta  and gabapentin  recently adjusted, lidocaine  cream not enough so asked that pharmacy send 15g tubes per treatment, to adequately cover the plantar surfaces of her feet to see if we can get relief.  4-28: Remains very sensitive, does feel some improvement 4-29: Patient with some daytime lethargy, pain primarily at night.  Reduce daytime gabapentin  to 600 mg every morning/600 mg q. afternoon, increase nighttime dose to 1000 mg.  Increase duloxetine  to 30 mg twice daily. 4-30: Patient did much better overnight with the above changes. 5-1: Discussed with patient further weaning daytime gabapentin ; she feels good on current regimen and wishes to hold off at this time 5/5 add  prn robaxin  for muscle spams 5/6 tramadol  PRN started 5/7 patient reports pain controlled today other than mild headache improving with Tylenol .  Continue to monitor 5/8 Continue current pain regimen.  Do not want to increase gabapentin  due to concerns of sedation.  Could consider trying Lyrica 5/9 patient reports pain is doing better when she keeps her legs warm.  Denies any pain currently or last night.  Continue current regimen.  Qutenza could be option outpatient if her distal neuropathy pain worsens again.  Overall pain has been controlled so we will hold off on adjustments. -08/06/23 pain improved with tramadol  overnight  5/12-13 pain controlled with tramadol , Robaxin  and gabapentin  current dose.  Continue to monitor  4. Mood/Behavior/Sleep: LCSW to follow for evaluation and support.              -antipsychotic agents: N/A  -Neuropsych consult  -07/23/23 didn't sleep well, has melatonin PRN to use - 4-28: Sleep interrupted by pain as above.  Increasing gabapentin  and duloxetine ; may benefit from transition to Elavil nightly If no improvement 4-30: Sleeping better.  Thinks melatonin as needed is over sedating, advised that this is per her  request.  5. Neuropsych/cognition: This patient is capable of making decisions on her own behalf. - 4-28: Dr. Cheryll Corti evaluated today; appreciate his professional assessment  6. Skin/Wound Care: Routine pressure relief measures.   - Chest port appears clean, routine dressing changes  7. Fluids/Electrolytes/Nutrition: Monitor I/O. Continue Ensure supplements and vitamin supplementation -07/22/23 hypoK+ 3.4, KCL 40meq po x 1- recheck 4/28 -07/23/23 added labs for tomorrow (Mg/phos, CMP, CK) 4/28: Hypokalemia worsened to 2.9 status post 40 mill equivalents; add 40 mill equivalents KCl PO, +40 mill equivalents IV today.  Repeat BMP tonight and tomorrow a.m. Phos elevated, so we will avoid K-Phos--repeat potassium 3.4 at 6 PM. 4-29: Hypokalemia resolved.  Repeat labs Thursday. 5/1: Potassium back to 2.9.  DC HCTZ.  Add 40 mill equivalents daily p.o. repletion, another 20 mill equivalents IV today.  Repeat labs in AM. 07/29/23 K 3.1 yesterday, will do 20mEq IV once today and continue the 40mEq PO, repeat labs Monday 5/5 K+ up to 3.3, increase to 30meq BID 5/8 K+ up to 3.5 continue supplment, MG low at 1.6, IV Mg 2 g given after discussion with pharmacy.  Recheck tomorrow -08/05/23 Mg 1.9, K 3.5, monitor -5/12 add mg oxide 400mg  BIDsupplement  8. Non-specific myositis: Cymbalta  d/c by neurology due to concerns of rhabdomyolysis --Cymbalta  30 mg resumed 04/23 given improving myositis.  --Monitor CK intermittently improved from 1536-->514--> 398 --consider muscle biopsy if symptoms do not improve -07/23/23 CK to be done tomorrow and weekly on Mon 4-28: CK downtrending, continue to trend 5/1: CK has normalized. Scheduled for qMon checks-- might consider d/c'ing checks?  9. Multifocal PNA: Likely aspiration--most significant in lingula and RLL on CT w/o contrast chest 07/18/23 --Hx of chronic intermittent N/V. May need swallow evaluation. Swallow precautions added.   --started on Augmentin   04/22-->04/25 for 5 day course.  -No apparent signs of respiratory distress  11. Multiple Vitamin deficiencies: Vitamin B1  250 mg daily 4/22-->4/28 followed by 100 mg daily IV.  --now on B12 IM+PO, IV folate. Vitamin A  10,000 units daily. Vitamin D  50,000/wk, and MVI --recheck Mg, Phos (supplemented)-- ordered for Monday 4/28--Phos improving, magnesium  stable -5/8 MG low, discussed pharm- IV 2g today recheck Saturday-- see above #7 -5/9-5/11 add mg oxide 400mg  BID supplement Recheck thursday  12. Resting tachycardia: HR has been 110-120 range at baseline --not  on any BB -Start metoprolol  12.5 mg BID -07/23/23 still tachycardic but less, monitor  - intermittent, asymptomatic. Monitor -5/12-13 HR stable, continue to monitor       08/08/2023    3:56 AM 08/08/2023    3:48 AM 08/07/2023    8:06 PM  Vitals with BMI  Weight 317 lbs 7 oz    BMI 46.86    Systolic  136 101  Diastolic  98 59  Pulse  88 84     13. Accelerated HTN/orthostatic hypotension: BP poorly controlled. Continue hydrochlorothiazide  12.5mg  daily and Avapro  150mg  daily -- addition of BB? -4/25 BB started as in #12 -07/23/23 BPs better, monitor; of note, pharmacy note states pt was on amlodipine  previously? Remain off for now, but note this for d/c 4/28 : reduce HCTZ to 6.125 due to recurrent hypokalemia--BP stable 5/1: BUN elevated, blood pressure soft, hypokalemia as above; DC hydrochlorothiazide .  Got 500 cc IV fluid bolus this afternoon. 5-2: Remains low, irbesartan  reduced to 75 mg this AM; DC given ongoing symptomatic orthostasis, can consider resumption once BP comes up.  Will give 1 L IV fluids today and patient thinks she can tolerate TED hose now, so we will add this on.  Will get orthostatics after IV fluid. -5/3-4/25 BPs improving; monitor -5/6-8 BP controlled, continue current regimen -5/9 patient felt to have high diastolic BP.  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 Latanoprost  and alphagan  gtts             --IVIG 04/21- 04/24. Followed by Dr. Curley Double. -07/23/23 humira, retinA, and rhopressa  listed in pharmacy note as "resume as needed in CIR or at discharge"; clarify when/if these need to be restarted while here 4-29: Should be OK to resume Retin A and Rhopressa ; will inquire about bringing from home. -- will check with Optho about Cellcept and Humira resumption.  4-30: Family brought Rhopressa  and Humira in from home; pharmacy holding Humira until clarified by Optho.  Rhopressa  resumed. 5-1: Spoke with Dr. Mason Sole, continue to hold Humira and CellCept pending outpatient follow-up with him shortly after discharge.  15. Urinary retention: Purewick being used--monitor voiding with PVR/bladder scan. Toilet every 4 hours  -4/25 PVR 64, continue to monitor   - 4-28: No recent PVRs documented, patient denies incontinence, monitor  16. Chronic Asthma: Continue Dulera  BID and singulair  10mg  nightly, azelastin BID --Followed by Dr. Idolina Maker.   17. H/o chronic gastritis w/nausea and vomiting: D/c Ibuprofen . Continue Protonix  40 mg daily and Pepcid  40mg  nightly; was on reglan  at home, has PRN compazine  here. Carafate  1g BID.  -- Followed by [facility].  No complaints while at rehab  18. Abnormal LFTs: Normal liver parenchyma and GB sludge noted on abdominal ultrasound 07/16/23.  --Resolving. Recheck in am-pending - 07/23/23 LFTs a bit improved 4/26, repeat CMP tomorrow morning; of  note, HepB SAb reactive, core Ab positive 4-28: LFTs looking better 4/12 improved  19. Peripheral polyneuropathy: Cymbalta  resumed 04/23 as  CK trending down. Will check weekly -- See #3 above; may benefit from transition to Lyrica or trial of Qutenza as outpatient.  Was establishing with Dr. Rayleen Cal  18. Anemia of chronic disease: Likely malabsorption w/ Low iron but elevated TIBC/Ferritin -07/23/23 Hgb 9.9 on Fri, repeat weekly on Mon/Thurs; iron studies done 4/21 showing low TIBC 172, iron 46, ferritin elevated 791.  -5/8 HGB overall stable 8.5 -5/12 hgb up to 9.3, monitor   19. Morbid obesity: BMI 48. Followed by RD/Bariatric nutrition for wt loss. Plan for Bariatric surgery 08/2023.              --Now on aggressive vitamin supplementation as above  20. Hyponatremia: Na down to 132-->question due to IVIG 4/31-4/24.  --May see rise in LFTs again.  -4/25 recheck labs still pending, will ask nursing to call to check on this -07/23/23 Na 131 on 4/26, recheck tomorrow to see trend or need for supplementation  4/28: NA stable 132. 5/1: Na 134, stable>> 3.1 5/2; monitor  5/12 stable at 139  21. Severe tricuspid regurgitation/grade 1 diastolic heart failure   - 4/29: add daily weights with diuretic adjustments--stable -5/2: Weights remain stable, no external edema with DC hydrochlorothiazide .  Continue to monitor closely. -07/30/23 wt down significantly today, wonder if it's inaccurate; monitor trend -5/9 weight is overall stable, continue current Filed Weights   08/07/23 0500 08/07/23 1610 08/08/23 0356  Weight: 63.5 kg (!) 144 kg (!) 144 kg    22. Gout: continue allopurinol  100mg  QD; off colchicine  for now, resume as appropriate   23. Diarrhea/constipation: -07/23/23 pt now having runny stools, thinks it's the food; stop miralax  daily and change to PRN; monitor 4-28: Ongoing liquid stool; encourage p.o. fluids, replete electrolytes as above, and adding fibercon supplemen\t   - 4/29:  No Bms today; slowing down. [place] - 4-30: Diarrhea followed by smears, KUB performed to ensure no overflow incontinence, was normal. 5/1: KUB normal, patient feels she is having adequate bowel movements.   Last bowel movement 5-2, adequate per patient  LBM 5/7, consider additional medication if no BM by tomorrow -08/05/23 LBM 2 days ago per pt, 3 days ago per documentation; will see if she has BM today but if not then will start meds. Encouraged to use miralax  today  -5/13 LBM today continue to follow 24. Dysuria:  -07/29/23 pt stating some dysuria/difficulty with urination; ordered U/A -07/30/23 U/A not done yesterday d/t BMs everytime she urinated; will try urinal today; f/up on results  -5/5/5 U/A 5/3 with 21-50 WBC, many bacteria start kelfex for suspected UTI, culture added 08/04/23 symptoms have improved, Proteus on urine culture, change to Augmentin  and DC keflex  -08/05/23 got 3.5 days of keflex , so augmentin  through 5/14  Symptoms have resolved  25. Anxiety  -xanax  0.25mg  PRN  -5/7 BuSpar  3 times daily 5 mg  Improved    LOS: 19 days A FACE TO FACE EVALUATION WAS PERFORMED  Lylia Sand 08/08/2023, 8:53 AM

## 2023-08-08 NOTE — Progress Notes (Signed)
 Patient ID: Tiffany Velasquez, female   DOB: [birth year], 28 y.o.   MRN: [id number]  SW met with pt in room to discuss updates from team conference, and discuss family edu. Family edu on Tuesday 1pm-2:30pm. SW will order DME.    Norval Been, MSW, LCSW Office: [phone] Cell: [phone] Fax: [phone]

## 2023-08-08 NOTE — Progress Notes (Signed)
 Occupational Therapy Session Note  Patient Details  Name: Tiffany Velasquez MRN: [id number] Date of Birth: April 23, 1995  Today's Date: 08/08/2023 OT Individual Time: 1105-1200 OT Individual Time Calculation (min): 55 min   Today's Date: 08/08/2023 OT Individual Time: 1350-1445 OT Individual Time Calculation (min): 55 min   Short Term Goals: Week 3:  OT Short Term Goal 1 (Week 3): Pt will perform 3/3 toileting tasks with CGA + LRAD. OT Short Term Goal 2 (Week 3): Pt will bathe LB with CGA + LRAD. OT Short Term Goal 3 (Week 3): Pt will perform tub/shower transfer with CGA + LRAD.  Skilled Therapeutic Interventions/Progress Updates:   Session 1: Pt received resting in bed, reporting 3-4/10 pain in L-foot. Time dedicated to retrieving rollator for introduction into community-level mobility. Pt ambulates from room>ADL apartment with x1 seated rest break, and from main therapy gym>room with no needed rest breaks. Pt requires Min cuing for brake safety. In ADL apartment, pt and OT discuss the need for TTB, performing transfer with CGA.   In main therapy gym, pt instructed in series of dynamic standing activities, including weaving in/out of cones and tapping colored dots of floor. Pt performs x2 rounds of each activity with no UE support and CGA-light Min A (HHA).   Discussed potential for outing, patient excited about opportunity, plan to follow-up with RT.   Session 2: Pt received sitting on therapy mat, from direct PT handoff. Pt with reports of 4/10 pain in B knees with activity, rest provided as needed. Pt ambulates to all therapy locations with rollator + supervision, Min cuing for brake safety. In day room, pt participates in x1 games of corn hole to challenge dynamic balance by having patient retrieve/toss bean bags outside of BOS (x10 bean bags), all with CGA + no UE. Pt retrieves bean bags from floor level with use of reacher. Pt then dependently transported to outdoor setting for change of  scenery. In outdoor setting, pt instructed in series of BUE exercises utilizing yellow theraband. Pt performs 2x10 reps Horizontal Ab/adduction, Diagonal Ab/adduction, and Punches, requiring multi-modal cuing for correct form.    Pt remained resting in bed with all immediate needs met.   Therapy Documentation Precautions:  Precautions Precautions: Fall Recall of Precautions/Restrictions: Intact Precaution/Restrictions Comments: 6 falls recently Restrictions Weight Bearing Restrictions Per Provider Order: No   Therapy/Group: Individual Therapy  Artemus Biles, OTR/L, MSOT  08/08/2023, 6:19 AM

## 2023-08-09 NOTE — Progress Notes (Signed)
 Occupational Therapy Session Note  Patient Details  Name: Tiffany Velasquez MRN: [id number] Date of Birth: 1995-10-19  Today's Date: 08/09/2023 OT Individual Time: 1105-1200 OT Individual Time Calculation (min): 55 min   Short Term Goals: Week 3:  OT Short Term Goal 1 (Week 3): Pt will perform 3/3 toileting tasks with CGA + LRAD. OT Short Term Goal 2 (Week 3): Pt will bathe LB with CGA + LRAD. OT Short Term Goal 3 (Week 3): Pt will perform tub/shower transfer with CGA + LRAD.  Skilled Therapeutic Interventions/Progress Updates:  Pt received resting in bed, increased verbal cuing required for arousal. Pt with reports of increased soreness in B calves, improved with muscle relaxers, rest provided as needed. Sit<>stand transfers during session with CGA progressing to close supervision + rollator. Pt completes sink-side sponge-bathing with setup/supervision of needed items, utilizing sink-edge for unilateral support during standing LB care. Pt dresses in similar fashion, using figure-4 technique to manage threading/socks. Time dedicated to coordinating family/caregiver education. Pt then completes household level functional mobility for activity tolerance training and BLE strengthening, requiring x2 seated rest-breaks and supervision + rollator. Pt remained sitting EOB with all immediate needs met.   Therapy Documentation Precautions:  Precautions Precautions: Fall Recall of Precautions/Restrictions: Intact Precaution/Restrictions Comments: 6 falls recently Restrictions Weight Bearing Restrictions Per Provider Order: No   Therapy/Group: Individual Therapy  Artemus Biles, OTR/L, MSOT  08/09/2023, 6:25 AM

## 2023-08-09 NOTE — Plan of Care (Signed)
  Problem: RH BOWEL ELIMINATION Goal: RH STG MANAGE BOWEL WITH ASSISTANCE Description: STG Manage Bowel with supervision Assistance. Outcome: Progressing   Problem: RH BLADDER ELIMINATION Goal: RH STG MANAGE BLADDER WITH ASSISTANCE Description: STG Manage Bladder With supervision Assistance Outcome: Progressing   Problem: RH SKIN INTEGRITY Goal: RH STG SKIN FREE OF INFECTION/BREAKDOWN Description: Manage skin free of infection/breakdown with supervision Outcome: Progressing   Problem: RH SAFETY Goal: RH STG ADHERE TO SAFETY PRECAUTIONS W/ASSISTANCE/DEVICE Description: STG Adhere to Safety Precautions With  supervision Assistance/Device. Outcome: Progressing

## 2023-08-09 NOTE — Progress Notes (Signed)
 Physical Therapy Session Note  Patient Details  Name: Tiffany Velasquez MRN: [id number] Date of Birth: [birth year]  Today's Date: 08/09/2023 PT Individual Time: 6578-4696 + 1305-1500 PT Individual Time Calculation (min): 55 min + 70 min  Short Term Goals: Week 3:  PT Short Term Goal 1 (Week 3): STG = LTG due to ELOS  Skilled Therapeutic Interventions/Progress Updates:    SESSION 1: Pt presents in room seated EOB, agreeable to PT. Pt reporting soreness in posterior BLEs, premedicated. Session focused on therapeutic activities to promote improved tolerance to upright and transfer training from lower surfaces, and therapeutic exercise for activity tolerance and decreasing pain and soreness. Pt provided with rest breaks as needed throughout session for pain and fatigue. Pt completes all transfers with supervision to rollator throughout session. Pt ambulates 100' from room to day room with rollator CGA. Pt completes x5 sit<>stands with supervision from 20" mat height, provided with education on transfer technique for various surface heights and furniture. Pt ambulates from day room to ortho gym with rollator supervision/CGA with one standing rest break and one seated rest break on rollator, cues for safety with sitting on rollator for locking brakes and parking against wall. Pt ambulates to nustep, completes nustep on L1 for 12 minutes as therapeutic exercise to promote activity tolerance and decrease pain and soreness with pt reporting decreased soreness in BLEs following activity. Pt ambulates from ortho gym back to room ~200' with CGA with one standing rest break, cues to decrease gait speed to improve endurance with pt demonstrating understanding. Pt returns to room and remains seated EOB with all needs within reach, cal light in place at end of session.   SESSION 2: Pt presents in room in bed, agreeable to PT but slow to initiate secondary to pt reporting fatigue and cramping pain, pt thinking she may  be starting to menstruate. RN notified and provides pain medication. Session focused on therapeutic activities for tolerance to upright, gait training for stair negotiation, and NMR for BLE/core strengthening and dynamic standing balance needed for functional ambulation and transfers. Pt completes transfers with supervision throughout session. Pt dons pants and shoes with supervision seated EOB. Pt ambulates with rollator ~150' with CGA to main gym, takes seated rest break. Pt then completes up/down 4 steps x2 trials side stepping using LHR, cues for foot placement on step, completed to simulate home environment. Pt then completes x5 sit<>stands with hands on knees to promote BLE strengthening and muscle fiber recruitment needed for transfers, gait, and stair negotiation. Pt completes NMR in standing for core strengthening and dynamic standing balance including palloff press 6# med ball x10, overhead press 6# ball x10, band pull apart red band x10, diagonals x10 BUE (LOB posteriorly however pt able to catch self to sit to chair), standing marches x20 holding 6# ball. Pt self propels WC with min assist and BLEs back to room to promote BLE strengthening, then completes ambulatory transfer to toilet with rollator and supervision, completes 3/3 toileting tasks with supervision, continent and charted. Pt returns to bed with rollator supervision and remains seated EOB with all needs within reach, call light in place at end of session.    Therapy Documentation Precautions:  Precautions Precautions: Fall Recall of Precautions/Restrictions: Intact Precaution/Restrictions Comments: 6 falls recently Restrictions Weight Bearing Restrictions Per Provider Order: No   Therapy/Group: Individual Therapy  Annia Kilts PT, DPT 08/09/2023, 9:47 AM

## 2023-08-09 NOTE — Progress Notes (Signed)
 PROGRESS NOTE   Subjective/Complaints:  Patient reports some pain in her feet yesterday, overall controlled with current medications.  She reports she had a busy day yesterday with multiple therapy sessions scheduled together, sessions more to spread apart today.  Reports she had a bowel movement yesterday.   ROS: as per HPI. Denies CP, SOB, abd pain, N/V/D, or any other complaints at this time.   + burning pain both feet--improved, this is worse at night + Insomnia, improved + Orthostatic hypotension- improved + Anxiety-improved  Objective:   No results found.   Recent Labs    08/07/23 0315  WBC 3.5*  HGB 9.3*  HCT 30.2*  PLT 273    Recent Labs    08/07/23 0315  NA 139  K 3.6  CL 115*  CO2 17*  GLUCOSE 93  BUN 9  CREATININE 0.57  CALCIUM  9.1    Intake/Output Summary (Last 24 hours) at 08/09/2023 1045 Last data filed at 08/09/2023 0735 Gross per 24 hour  Intake 718 ml  Output --  Net 718 ml        Physical Exam: Vital Signs Blood pressure 124/82, pulse 76, temperature 98.3 F (36.8 C), temperature source Oral, resp. rate 18, height 5\' 9"  (1.753 m), weight 65.2 kg, last menstrual period 06/27/2023, SpO2 100%.    General: NAD, sitting at edge of bed appears comfortable HEENT: Head is normocephalic, atraumatic, membranes little dry Neck: Supple without JVD or lymphadenopathy Heart: RRR Chest: CTA bilaterally without wheezes, rales, or rhonchi; no distress. Port R chest  Abdomen: Soft, non-tender, non-distended, normoactive bowel sounds Extremities: No clubbing, cyanosis, Tr b/l edema.  Psych: Appropriate, cooperative, pleasant Skin: Clean and intact without signs of breakdown over exposed surfaces.  Right chest port C-D-I  Neuro:  Awake, alert, oriented x 4.  No apparent cognitive deficits. Strength antigravity 4- out of 5 bilateral upper extremities, 4-/5 bilateral hip flexors, 4 out of 5 distal  lower extremities. Hypersensitivity to light touch in bilateral feet/ankles-decreased from prior.  Otherwise, sensation intact No abnormal tone noted   Assessment/Plan: 1. Functional deficits which require 3+ hours per day of interdisciplinary therapy in a comprehensive inpatient rehab setting. Physiatrist is providing close team supervision and 24 hour management of active medical problems listed below. Physiatrist and rehab team continue to assess barriers to discharge/monitor patient progress toward functional and medical goals  Care Tool:  Bathing    Body parts bathed by patient: Right arm, Left arm, Chest, Abdomen, Front perineal area, Buttocks, Right upper leg, Left upper leg, Right lower leg, Left lower leg, Face   Body parts bathed by helper: Buttocks, Right upper leg, Left upper leg, Right lower leg, Left lower leg     Bathing assist Assist Level: Contact Guard/Touching assist     Upper Body Dressing/Undressing Upper body dressing   What is the patient wearing?: Pull over shirt    Upper body assist Assist Level: Supervision/Verbal cueing    Lower Body Dressing/Undressing Lower body dressing    Lower body dressing activity did not occur: Environmental limitations (no clothes available) What is the patient wearing?:  (Shorts)     Lower body assist Assist for lower body dressing:  Minimal Assistance - Patient > 75%     Toileting Toileting Toileting Activity did not occur Press photographer and hygiene only): N/A (no void or bm)  Toileting assist Assist for toileting: Independent with assistive device     Transfers Chair/bed transfer  Transfers assist     Chair/bed transfer assist level: Contact Guard/Touching assist     Locomotion Ambulation   Ambulation assist   Ambulation activity did not occur: Safety/medical concerns  Assist level: Contact Guard/Touching assist Assistive device: Walker-rolling Max distance: 170'   Walk 10 feet  activity   Assist  Walk 10 feet activity did not occur: Safety/medical concerns  Assist level: Contact Guard/Touching assist Assistive device: Walker-rolling   Walk 50 feet activity   Assist Walk 50 feet with 2 turns activity did not occur: Safety/medical concerns  Assist level: Contact Guard/Touching assist Assistive device: Walker-rolling    Walk 150 feet activity   Assist Walk 150 feet activity did not occur: Safety/medical concerns  Assist level: Contact Guard/Touching assist Assistive device: Walker-rolling    Walk 10 feet on uneven surface  activity   Assist Walk 10 feet on uneven surfaces activity did not occur: Safety/medical concerns         Wheelchair     Assist Is the patient using a wheelchair?: No             Wheelchair 50 feet with 2 turns activity    Assist            Wheelchair 150 feet activity     Assist          Blood pressure 124/82, pulse 76, temperature 98.3 F (36.8 C), temperature source Oral, resp. rate 18, height 5\' 9"  (1.753 m), weight 65.2 kg, last menstrual period 06/27/2023, SpO2 100%.  Medical Problem List and Plan: 1. 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 If unaccessed?             -ELOS/Goals: 2-4 weeks -  min A hopefully             -Continue CIR  - 4/29: Having help from mom/dad at home in discharge. Mod-Max A ADLs with bed-level LB. Biggest barriers are weakness and some self-limiting behaviors. Walked 15 feet with EVA walker. Regular walker STS today.  -Expected DC was adjusted to 5/21-continue with current date  2.  Antithrombotics: -DVT/anticoagulation:  Pharmaceutical: Lovenox  70mg  daily             -antiplatelet therapy: N/A 3. Pain Management: Has been using oxycodone  prn past 3 days?  For foot, leg and back pain- also has Gabapentin  800mg  TID (increased 4/25) - duloxetine  stopped initially due to myositis- 30 mg daily resumed 4/23 -07/23/23 feet very sensitive, but cymbalta  and gabapentin  recently adjusted, lidocaine  cream not enough so asked that pharmacy send 15g tubes per treatment, to adequately cover the plantar surfaces of her feet to see if we can get relief.  4-28: Remains very sensitive, does feel some improvement 4-29: Patient with some daytime lethargy, pain primarily at night.  Reduce daytime gabapentin  to 600 mg every morning/600 mg q. afternoon, increase nighttime dose to 1000 mg.  Increase duloxetine  to 30 mg twice daily. 4-30: Patient did much better overnight with the above changes. 5-1: Discussed with patient further weaning daytime gabapentin ; she feels good on  current regimen and wishes to hold off at this time 5/5 add prn robaxin  for muscle spams 5/6 tramadol  PRN started 5/7 patient reports pain controlled today other than mild headache improving with Tylenol .  Continue to monitor 5/8 Continue current pain regimen.  Do not want to increase gabapentin  due to concerns of sedation.  Could consider trying Lyrica 5/9 patient reports pain is doing better when she keeps her legs warm.  Denies any pain currently or last night.  Continue current regimen.  Qutenza could be option outpatient if her distal neuropathy pain worsens again.  Overall pain has been controlled so we will hold off on adjustments. -08/06/23 pain improved with tramadol  overnight  5/12-14 pain controlled with tramadol , Robaxin  and gabapentin  current dose.  Continue to monitor  4. Mood/Behavior/Sleep: LCSW to follow for evaluation and support.              -antipsychotic agents: N/A  -Neuropsych consult  -07/23/23 didn't sleep well, has melatonin PRN to use - 4-28: Sleep interrupted by pain as above.  Increasing gabapentin  and duloxetine ; may benefit from transition to Elavil nightly If no  improvement 4-30: Sleeping better.  Thinks melatonin as needed is over sedating, advised that this is per her request.  5. Neuropsych/cognition: This patient is capable of making decisions on her own behalf. - 4-28: Dr. Cheryll Corti evaluated today; appreciate his professional assessment  6. Skin/Wound Care: Routine pressure relief measures.   - Chest port appears clean, routine dressing changes  7. Fluids/Electrolytes/Nutrition: Monitor I/O. Continue Ensure supplements and vitamin supplementation -07/22/23 hypoK+ 3.4, KCL 40meq po x 1- recheck 4/28 -07/23/23 added labs for tomorrow (Mg/phos, CMP, CK) 4/28: Hypokalemia worsened to 2.9 status post 40 mill equivalents; add 40 mill equivalents KCl PO, +40 mill equivalents IV today.  Repeat BMP tonight and tomorrow a.m. Phos elevated, so we will avoid K-Phos--repeat potassium 3.4 at 6 PM. 4-29: Hypokalemia resolved.  Repeat labs Thursday. 5/1: Potassium back to 2.9.  DC HCTZ.  Add 40 mill equivalents daily p.o. repletion, another 20 mill equivalents IV today.  Repeat labs in AM. 07/29/23 K 3.1 yesterday, will do 20mEq IV once today and continue the 40mEq PO, repeat labs Monday 5/5 K+ up to 3.3, increase to 30meq BID 5/8 K+ up to 3.5 continue supplment, MG low at 1.6, IV Mg 2 g given after discussion with pharmacy.  Recheck tomorrow -08/05/23 Mg 1.9, K 3.5, monitor -5/12 add mg oxide 400mg  BIDsupplement Recheck labs tomorrow  8. Non-specific myositis: Cymbalta  d/c by neurology due to concerns of rhabdomyolysis --Cymbalta  30 mg resumed 04/23 given improving myositis.  --Monitor CK intermittently improved from 1536-->514--> 398 --consider muscle biopsy if symptoms do not improve -07/23/23 CK to be done tomorrow and weekly on Mon 4-28: CK downtrending, continue to trend 5/1: CK has normalized. Scheduled for qMon checks-- might consider d/c'ing checks?  9. Multifocal PNA: Likely aspiration--most significant in lingula and RLL on CT w/o contrast chest  07/18/23 --Hx of chronic intermittent N/V. May need swallow evaluation. Swallow precautions added.   --started on Augmentin  04/22-->04/25 for 5 day course.  -No apparent signs of respiratory distress  11. Multiple Vitamin deficiencies: Vitamin B1  250 mg daily 4/22-->4/28 followed by 100 mg daily IV.  --now on B12 IM+PO, IV folate. Vitamin A  10,000 units daily. Vitamin D  50,000/wk, and MVI --recheck Mg, Phos (supplemented)-- ordered for Monday 4/28--Phos improving, magnesium  stable -5/8 MG low, discussed pharm- IV 2g today recheck Saturday-- see above #7 -5/9-5/11 add mg oxide 400mg  BID  supplement Recheck labs tomorrow  12. Resting tachycardia: HR has been 110-120 range at baseline --not on any BB -Start metoprolol  12.5 mg BID -07/23/23 still tachycardic but less, monitor  - intermittent, asymptomatic. Monitor -5/12-14 HR stable, continue to monitor       08/09/2023    5:53 AM 08/09/2023    5:00 AM 08/08/2023    8:21 PM  Vitals with BMI  Weight  143 lbs 13 oz   BMI  21.23   Systolic 124  105  Diastolic 82  68  Pulse 76  76     13. Accelerated HTN/orthostatic hypotension: BP poorly controlled. Continue hydrochlorothiazide  12.5mg  daily and Avapro  150mg  daily -- addition of BB? -4/25 BB started as in #12 -07/23/23 BPs better, monitor; of note, pharmacy note states pt was on amlodipine  previously? Remain off for now, but note this for d/c 4/28 : reduce HCTZ to 6.125 due to recurrent hypokalemia--BP stable 5/1: BUN elevated, blood pressure soft, hypokalemia as above; DC hydrochlorothiazide .  Got 500 cc IV fluid bolus this afternoon. 5-2: Remains low, irbesartan  reduced to 75 mg this AM; DC given ongoing symptomatic orthostasis, can consider resumption once BP comes up.  Will give 1 L IV fluids today and patient thinks she can tolerate TED hose now, so we will add this on.  Will get orthostatics after IV fluid. -5/3-4/25 BPs improving; monitor -5/6-8 BP controlled, continue current  regimen -5/9 patient felt to have high diastolic BP.  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 Latanoprost  and alphagan  gtts             --IVIG 04/21- 04/24. Followed by Dr. Curley Double. -07/23/23 humira, retinA, and rhopressa  listed in pharmacy note as "resume as needed in CIR or at discharge"; clarify when/if these need to be restarted while here 4-29: Should be OK to resume Retin A and Rhopressa ; will inquire about bringing from home. -- will check with Optho about Cellcept and Humira resumption.  4-30: Family brought Rhopressa  and Humira in from home; pharmacy holding Humira until clarified by Optho.  Rhopressa  resumed. 5-1: Spoke with Dr. Mason Sole, continue to hold Humira and CellCept pending outpatient follow-up with him shortly after discharge.  15. Urinary retention: Purewick being used--monitor voiding with PVR/bladder scan. Toilet every 4 hours  -4/25 PVR 64, continue to monitor   - 4-28: No recent PVRs documented, patient denies incontinence, monitor  16. Chronic Asthma: Continue Dulera  BID and singulair  10mg  nightly, azelastin BID --Followed by Dr. Idolina Maker.   17. H/o chronic gastritis w/nausea and vomiting: D/c Ibuprofen . Continue Protonix  40 mg daily and Pepcid  40mg  nightly; was on reglan  at home, has PRN compazine  here. Carafate  1g BID.  -- Followed by [facility].  No complaints while at rehab  18. Abnormal LFTs: Normal liver parenchyma and GB sludge noted on abdominal  ultrasound 07/16/23.  --Resolving. Recheck in am-pending - 07/23/23 LFTs a bit improved 4/26, repeat CMP tomorrow morning; of note, HepB SAb reactive, core Ab positive  4-28: LFTs looking better 4/12 improved  19. Peripheral polyneuropathy: Cymbalta  resumed 04/23 as CK trending down. Will check weekly -- See #3 above; may benefit from transition to Lyrica or trial of Qutenza as outpatient.  Was establishing with Dr. Rayleen Cal  18. Anemia of chronic disease: Likely malabsorption w/ Low iron but elevated TIBC/Ferritin -07/23/23 Hgb 9.9 on Fri, repeat weekly on Mon/Thurs; iron studies done 4/21 showing low TIBC 172, iron 46, ferritin elevated 791.  -5/8 HGB overall stable 8.5 -5/12 hgb up to 9.3, monitor   19. Morbid obesity: BMI 48. Followed by RD/Bariatric nutrition for wt loss. Plan for Bariatric surgery 08/2023.              --Now on aggressive vitamin supplementation as above  20. Hyponatremia: Na down to 132-->question due to IVIG 4/31-4/24.  --May see rise in LFTs again.  -4/25 recheck labs still pending, will ask nursing to call to check on this -07/23/23 Na 131 on 4/26, recheck tomorrow to see trend or need for supplementation  4/28: NA stable 132. 5/1: Na 134, stable>> 3.1 5/2; monitor  5/12 stable at 139 Recheck tomorrow  21. Severe tricuspid regurgitation/grade 1 diastolic heart failure   - 4/29: add daily weights with diuretic adjustments--stable -5/2: Weights remain stable, no external edema with DC hydrochlorothiazide .  Continue to monitor closely. -07/30/23 wt down significantly today, wonder if it's inaccurate; monitor trend -5/9 weight is overall stable, continue current Filed Weights   08/07/23 1610 08/08/23 0356 08/09/23 0500  Weight: (!) 144 kg (!) 144 kg 65.2 kg    22. Gout: continue allopurinol  100mg  QD; off colchicine  for now, resume as appropriate   23. Diarrhea/constipation: -07/23/23 pt now having runny stools, thinks it's the food; stop miralax  daily and  change to PRN; monitor 4-28: Ongoing liquid stool; encourage p.o. fluids, replete electrolytes as above, and adding fibercon supplemen\t   - 4/29: No Bms today; slowing down. [place] - 4-30: Diarrhea followed by smears, KUB performed to ensure no overflow incontinence, was normal. 5/1: KUB normal, patient feels she is having adequate bowel movements.   Last bowel movement 5-2, adequate per patient  LBM 5/7, consider additional medication if no BM by tomorrow -08/05/23 LBM 2 days ago per pt, 3 days ago per documentation; will see if she has BM today but if not then will start meds. Encouraged to use miralax  today  -5/13 LBM today continue to follow 24. Dysuria:  -07/29/23 pt stating some dysuria/difficulty with urination; ordered U/A -07/30/23 U/A not done yesterday d/t BMs everytime she urinated; will try urinal today; f/up on results  -5/5/5 U/A 5/3 with 21-50 WBC, many bacteria start kelfex for suspected UTI, culture added 08/04/23 symptoms have improved, Proteus on urine culture, change to Augmentin  and DC keflex  -08/05/23 got 3.5 days of keflex , so augmentin  through 5/14  Symptoms have resolved  25. Anxiety  -xanax  0.25mg  PRN  -5/7 BuSpar  3 times daily 5 mg  5/14 anxiety appears to be doing better, continue current regimen, infrequent use of Xanax     LOS: 20 days A FACE TO FACE EVALUATION WAS PERFORMED  Lylia Sand 08/09/2023, 10:45 AM

## 2023-08-10 LAB — CBC
HCT: 29.6 % — ABNORMAL LOW (ref 36.0–46.0)
Hemoglobin: 9 g/dL — ABNORMAL LOW (ref 12.0–15.0)
MCH: 28.8 pg (ref 26.0–34.0)
MCHC: 30.4 g/dL (ref 30.0–36.0)
MCV: 94.6 fL (ref 80.0–100.0)
Platelets: 235 10*3/uL (ref 150–400)
RBC: 3.13 MIL/uL — ABNORMAL LOW (ref 3.87–5.11)
RDW: 19.5 % — ABNORMAL HIGH (ref 11.5–15.5)
WBC: 4 10*3/uL (ref 4.0–10.5)
nRBC: 0 % (ref 0.0–0.2)

## 2023-08-10 LAB — BASIC METABOLIC PANEL WITH GFR
Anion gap: 6 (ref 5–15)
BUN: 11 mg/dL (ref 6–20)
CO2: 18 mmol/L — ABNORMAL LOW (ref 22–32)
Calcium: 8.8 mg/dL — ABNORMAL LOW (ref 8.9–10.3)
Chloride: 113 mmol/L — ABNORMAL HIGH (ref 98–111)
Creatinine, Ser: 0.6 mg/dL (ref 0.44–1.00)
GFR, Estimated: >60 mL/min (ref >60)
Glucose, Bld: 88 mg/dL (ref 70–99)
Potassium: 3.6 mmol/L (ref 3.5–5.1)
Sodium: 137 mmol/L (ref 135–145)

## 2023-08-10 LAB — MAGNESIUM: Magnesium: 1.7 mg/dL (ref 1.7–2.4)

## 2023-08-10 NOTE — Progress Notes (Signed)
 Occupational Therapy Session Note  Patient Details  Name: Tiffany Velasquez MRN: [id number] Date of Birth: [birth year]  Today's Date: 08/10/2023 OT Individual Time: 4098-1191 OT Individual Time Calculation (min): 70 min   Today's Date: 08/10/2023 OT Individual Time: 1305-1330 OT Individual Time Calculation (min): 25 min   Short Term Goals: Week 3:  OT Short Term Goal 1 (Week 3): Pt will perform 3/3 toileting tasks with CGA + LRAD. OT Short Term Goal 2 (Week 3): Pt will bathe LB with CGA + LRAD. OT Short Term Goal 3 (Week 3): Pt will perform tub/shower transfer with CGA + LRAD.  Skilled Therapeutic Interventions/Progress Updates:   Session 1: Pt received resting in bed with complaints of 3/10 pain in B-feet. Rest provided as needed. Pt performs sit<>stands this session with supervision + rollator, ambulatory toilet transfer in similar fashion. Pt completes 3/3 toileting tasks with supervision. 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 Actual IADL deferred due to busy facilites.   In main therapy gym, pt instructed in dynamic balance activity as patient tasked to weave in/out of lined cones while transporting items in hand. Pt completes x10 reps of the above activity with CGA progressing to close supervision + no AD. Pt requires x2 seated rest-breaks, HR appropriate for demand of task. Pt then participates in ~10 minutes of Nustep modality for general conditioning, tolerating level 3 resistance, reaching 615 steps. Pt ambulates from day room>room with  CGA with no AD or LOB. Pt remained sitting EOB with CSW present.   Session 2:  Pt received sitting EOB, reporting 5/10 stomach cramp pain, heat provided at end of session. Functional mobility with supervision + rollator. Pt loads washer with education provided on safe placement of rollator in relation to machine. Pt then instructed in 1x8-10 reps of BUE/core strengthening exercises, details  below: Torso twists Diagonal reaches Modified sit-ups  Pt remained sitting EOB with all immediate needs met, call bell within reach.   Therapy Documentation Precautions:  Precautions Precautions: Fall Recall of Precautions/Restrictions: Intact Precaution/Restrictions Comments: 6 falls recently Restrictions Weight Bearing Restrictions Per Provider Order: No   Therapy/Group: Individual Therapy  Artemus Biles, OTR/L, MSOT  08/10/2023, 6:13 AM

## 2023-08-10 NOTE — Group Note (Signed)
 Patient Details Name: Tiffany Velasquez MRN: [id number] DOB: 1995-09-28 Today's Date: 08/10/2023  Time Calculation: OT Group Time Calculation OT Group Start Time: 1430 OT Group Stop Time: 1530 OT Group Time Calculation (min): 60 min      Group Description: Dance Group: Pt participated in dance group with an emphasis on social interaction, motor planning, increasing overall activity tolerance and bimanual tasks. All songs were selected by group members. Dance moves included AROM of BUE/BLE gross motor movements with an emphasis on building functional endurance.    Individual level documentation: Patient completed group from sitting level. Patientt needed supervision to complete various dance moves with OT providing visual model.  Patient able to create her own modifications during group.  Pain:  0/10  Precautions:  Falls   Geoffery Kiel 08/10/2023, 3:47 PM

## 2023-08-10 NOTE — Progress Notes (Signed)
 Physical Therapy Session Note  Patient Details  Name: Tiffany Velasquez MRN: [id number] Date of Birth: April 23, 1995  Today's Date: 08/10/2023 PT Individual Time: 1017-1100 PT Individual Time Calculation (min): 43 min   Short Term Goals: Week 3:  PT Short Term Goal 1 (Week 3): STG = LTG due to ELOS  Skilled Therapeutic Interventions/Progress Updates:    Pt presents in room seated EOB, agreeable to PT, pt denies pain. Session focused on co-treatment with recreational therapist for community reintegration and community mobility. Pt completes all transfers with supervision throughout session. Pt transported from room to atrium of hospital dependently for energy conservation. Pt then ambulates with rollator from atrium into gift shop, navigating aisles and picking up items outside BOS with CGA/close supervision. Pt provided with cues for managing rollator within gift shop environment as well as managing rollator for sitting for rest break within store. Pt educated on energy conservation in community environment as well as navigating mobility around other people who may make sudden movements. Pt educated on rollator features including squeezing brakes to stop with pt demonstrating understanding. Pt maintains standing and ambulation 2x~8 minutes with extended seated rest break during which pt educated on community mobility with new mobility needs. Pt returns to room and remains seated on EOB with all needs within reach, call light in place and chair alarm donned and activated at end of session.   Therapy Documentation Precautions:  Precautions Precautions: Fall Recall of Precautions/Restrictions: Intact Precaution/Restrictions Comments: 6 falls recently Restrictions Weight Bearing Restrictions Per Provider Order: No    Therapy/Group: Individual Therapy  Annia Kilts PT, DPT 08/10/2023, 12:58 PM

## 2023-08-10 NOTE — Group Note (Signed)
 Patient Details Name: Tiffany Velasquez MRN: [id number] DOB: 1995-12-09 Today's Date: 08/10/2023 Goal:  Pt will actively participate in 60 minute therapeutic dance group.  MET  Group Description: Dance Group: Pt participated in dance group with an emphasis on social interaction, motor planning, increasing overall activity tolerance and bimanual tasks. All songs were selected by group members. Dance moves included AROM of BUE/BLE gross motor movements with an emphasis on building functional endurance.   Individual level documentation: Patient completed group from sitting level.  Patient needed supervision to complete various dance moves.  Pt smiling throughout the group and singing along.  Pain:no c/o     Tyshawn Ciullo 08/10/2023, 3:33 PM

## 2023-08-10 NOTE — Progress Notes (Signed)
 Recreational Therapy Session Note  Patient Details  Name: Tiffany Velasquez MRN: [id number] Date of Birth: 04-05-1995 Today's Date: 08/10/2023 Goal:  Pt will ambulate with rollator with contact guard assist in community spaces.  MET Pt will be provided with verbal education addressing energy conservation.   MET  Pain: no c/o Skilled Therapeutic Interventions/Progress Updates: Session focused on community reintegration during co-treat with PT.  Pt ambulated in the hospital atrium and the gift shop (simulated outing) with rollator with contact guard assist.  Pt demonstrated good safety awareness recognizing and initiating need for seated rest break due to knee buckling with independence. Verbal reminders on locating an available wall to rest rollator against before sitting for increased safety.  Discussed other options is wall space is not available in community environments.  Pt ambulated throughout the gift shop retrieving items form shelving with close supervision.  During seated rest breaks, discussed energy conservation techniques and to apply them in various community settings/tasks.  Pt stated understanding and share thing she was doing PTA to assist with safe mobility and energy conservation.  Therapy/Group: Co-Treatment   [name] 08/10/2023, 12:19 PM

## 2023-08-10 NOTE — Progress Notes (Signed)
 Patient ID: Tiffany Velasquez, female   DOB: 1996-02-12, 28 y.o.   MRN: [id number]  SW ordered DME- bariatric rollator, and TTB.   SW faxed outpatient PT order to [facility].   *Per PT pt will need bariatric DABSC. SW ordered item with [employer] via parachute.  Norval Been, MSW, LCSW Office: [phone] Cell: [phone] Fax: [phone]

## 2023-08-10 NOTE — Progress Notes (Signed)
 PROGRESS NOTE   Subjective/Complaints:  No new concerns or complaints this morning.  Reports pain is overall under control.  Making progress with therapy, denies any recent symptoms to indicate orthostatic hypotension.  ROS: as per HPI. Denies fever, CP, SOB, abd pain, N/V/D, or any other complaints at this time.   + burning pain both feet--improved, this is worse at night + Insomnia, improved + Orthostatic hypotension- improved + Anxiety-improved Denies constipation  Objective:   No results found.   Recent Labs    08/10/23 0357  WBC 4.0  HGB 9.0*  HCT 29.6*  PLT 235    Recent Labs    08/10/23 0357  NA 137  K 3.6  CL 113*  CO2 18*  GLUCOSE 88  BUN 11  CREATININE 0.60  CALCIUM  8.8*    Intake/Output Summary (Last 24 hours) at 08/10/2023 1344 Last data filed at 08/10/2023 1008 Gross per 24 hour  Intake 838 ml  Output --  Net 838 ml        Physical Exam: Vital Signs Blood pressure 113/73, pulse 83, temperature 99.2 F (37.3 C), temperature source Oral, resp. rate 18, height 5\' 9"  (1.753 m), weight 65.2 kg, last menstrual period 06/27/2023, SpO2 100%.    General: NAD, laying in bed,obese HEENT: Head is normocephalic, atraumatic, membranes moist Neck: Supple without JVD or lymphadenopathy Heart: RRR Chest: CTA bilaterally without wheezes, rales, or rhonchi; no distress. Port R chest  Abdomen: Soft, non-tender, non-distended, normoactive bowel sounds Extremities: No clubbing, cyanosis, Tr b/l edema.  Psych: Appropriate, cooperative, pleasant Skin: Clean and intact without signs of breakdown over exposed surfaces.  Right chest port C-D-I  Neuro:  Awake, alert, oriented x 4.  No apparent cognitive deficits. Strength antigravity 4- out of 5 bilateral upper extremities, 4-/5 bilateral hip flexors, 4 out of 5 distal lower extremities. Hypersensitivity to light touch in bilateral feet/ankles has improved.   Otherwise, sensation intact No abnormal tone noted   Assessment/Plan: 1. Functional deficits which require 3+ hours per day of interdisciplinary therapy in a comprehensive inpatient rehab setting. Physiatrist is providing close team supervision and 24 hour management of active medical problems listed below. Physiatrist and rehab team continue to assess barriers to discharge/monitor patient progress toward functional and medical goals  Care Tool:  Bathing    Body parts bathed by patient: Right arm, Left arm, Chest, Abdomen, Front perineal area, Buttocks, Right upper leg, Left upper leg, Right lower leg, Left lower leg, Face   Body parts bathed by helper: Buttocks, Right upper leg, Left upper leg, Right lower leg, Left lower leg     Bathing assist Assist Level: Supervision/Verbal cueing     Upper Body Dressing/Undressing Upper body dressing   What is the patient wearing?: Pull over shirt    Upper body assist Assist Level: Set up assist    Lower Body Dressing/Undressing Lower body dressing    Lower body dressing activity did not occur: Environmental limitations (no clothes available) What is the patient wearing?: Underwear/pull up     Lower body assist Assist for lower body dressing: Supervision/Verbal cueing     Toileting Toileting Toileting Activity did not occur (Clothing management and hygiene only):  N/A (no void or bm)  Toileting assist Assist for toileting: Set up assist     Transfers Chair/bed transfer  Transfers assist     Chair/bed transfer assist level: Supervision/Verbal cueing     Locomotion Ambulation   Ambulation assist   Ambulation activity did not occur: Safety/medical concerns  Assist level: Contact Guard/Touching assist Assistive device: Rollator Max distance: 150'   Walk 10 feet activity   Assist  Walk 10 feet activity did not occur: Safety/medical concerns  Assist level: Contact Guard/Touching assist Assistive device: Rollator    Walk 50 feet activity   Assist Walk 50 feet with 2 turns activity did not occur: Safety/medical concerns  Assist level: Contact Guard/Touching assist Assistive device: Rollator    Walk 150 feet activity   Assist Walk 150 feet activity did not occur: Safety/medical concerns  Assist level: Contact Guard/Touching assist Assistive device: Rollator    Walk 10 feet on uneven surface  activity   Assist Walk 10 feet on uneven surfaces activity did not occur: Safety/medical concerns         Wheelchair     Assist Is the patient using a wheelchair?: No             Wheelchair 50 feet with 2 turns activity    Assist            Wheelchair 150 feet activity     Assist          Blood pressure 113/73, pulse 83, temperature 99.2 F (37.3 C), temperature source Oral, resp. rate 18, height 5\' 9"  (1.753 m), weight 65.2 kg, last menstrual period 06/27/2023, SpO2 100%.  Medical Problem List and Plan: 1. 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 If unaccessed?             -ELOS/Goals: 2-4 weeks -  min A hopefully             -Continue CIR  - 4/29: Having help from mom/dad at home in discharge. Mod-Max A ADLs with bed-level LB. Biggest barriers are weakness and some self-limiting behaviors. Walked 15 feet with EVA walker. Regular walker STS today.  -Expected DC was adjusted to 5/21-continue with current date  2.  Antithrombotics: -DVT/anticoagulation:  Pharmaceutical: Lovenox  70mg  daily             -antiplatelet therapy: N/A 3. Pain Management: Has been using oxycodone  prn past 3 days? For foot, leg and back pain- also has Gabapentin  800mg  TID (increased 4/25) - duloxetine  stopped initially due to myositis- 30 mg daily resumed 4/23 -07/23/23 feet very  sensitive, but cymbalta  and gabapentin  recently adjusted, lidocaine  cream not enough so asked that pharmacy send 15g tubes per treatment, to adequately cover the plantar surfaces of her feet to see if we can get relief.  4-28: Remains very sensitive, does feel some improvement 4-29: Patient with some daytime lethargy, pain primarily at night.  Reduce daytime gabapentin  to 600 mg every morning/600 mg q. afternoon, increase nighttime dose to 1000 mg.  Increase duloxetine  to 30 mg twice daily. 4-30: Patient did much better overnight with the above changes. 5-1: Discussed with patient further weaning daytime gabapentin ; she feels good on current regimen and wishes to hold off at this time 5/5 add prn robaxin  for muscle spams 5/6 tramadol  PRN started 5/7 patient reports  pain controlled today other than mild headache improving with Tylenol .  Continue to monitor 5/8 Continue current pain regimen.  Do not want to increase gabapentin  due to concerns of sedation.  Could consider trying Lyrica 5/9 patient reports pain is doing better when she keeps her legs warm.  Denies any pain currently or last night.  Continue current regimen.  Qutenza could be option outpatient if her distal neuropathy pain worsens again.  Overall pain has been controlled so we will hold off on adjustments. -08/06/23 pain improved with tramadol  overnight  5/12-15 pain controlled with tramadol , Robaxin  and gabapentin  current dose.  Continue to monitor  4. Mood/Behavior/Sleep: LCSW to follow for evaluation and support.              -antipsychotic agents: N/A  -Neuropsych consult  -07/23/23 didn't sleep well, has melatonin PRN to use - 4-28: Sleep interrupted by pain as above.  Increasing gabapentin  and duloxetine ; may benefit from transition to Elavil nightly If no improvement 4-30: Sleeping better.  Thinks melatonin as needed is over sedating, advised that this is per her request.  5/15 reports still sleeping okay 5. Neuropsych/cognition:  This patient is capable of making decisions on her own behalf. - 4-28: Dr. Cheryll Corti evaluated today; appreciate his professional assessment  6. Skin/Wound Care: Routine pressure relief measures.   - Chest port appears clean, routine dressing changes  7. Fluids/Electrolytes/Nutrition: Monitor I/O. Continue Ensure supplements and vitamin supplementation -07/22/23 hypoK+ 3.4, KCL 40meq po x 1- recheck 4/28 -07/23/23 added labs for tomorrow (Mg/phos, CMP, CK) 4/28: Hypokalemia worsened to 2.9 status post 40 mill equivalents; add 40 mill equivalents KCl PO, +40 mill equivalents IV today.  Repeat BMP tonight and tomorrow a.m. Phos elevated, so we will avoid K-Phos--repeat potassium 3.4 at 6 PM. 4-29: Hypokalemia resolved.  Repeat labs Thursday. 5/1: Potassium back to 2.9.  DC HCTZ.  Add 40 mill equivalents daily p.o. repletion, another 20 mill equivalents IV today.  Repeat labs in AM. 07/29/23 K 3.1 yesterday, will do 20mEq IV once today and continue the 40mEq PO, repeat labs Monday 5/5 K+ up to 3.3, increase to 30meq BID 5/8 K+ up to 3.5 continue supplment, MG low at 1.6, IV Mg 2 g given after discussion with pharmacy.  Recheck tomorrow -08/05/23 Mg 1.9, K 3.5, monitor -5/12 add mg oxide 400mg  BIDsupplement -5/15 magnesium  stable at 1.7, continue supplements  8. Non-specific myositis: Cymbalta  d/c by neurology due to concerns of rhabdomyolysis --Cymbalta  30 mg resumed 04/23 given improving myositis.  --Monitor CK intermittently improved from 1536-->514--> 398 --consider muscle biopsy if symptoms do not improve -07/23/23 CK to be done tomorrow and weekly on Mon 4-28: CK downtrending, continue to trend 5/1: CK has normalized. Scheduled for qMon checks-- might consider d/c'ing checks? -CK checks discontinued  9. Multifocal PNA: Likely aspiration--most significant in lingula and RLL on CT w/o contrast chest 07/18/23 --Hx of chronic intermittent N/V. May need swallow evaluation. Swallow precautions  added.   --started on Augmentin  04/22-->04/25 for 5 day course.  -No apparent signs of respiratory distress  11. Multiple Vitamin deficiencies: Vitamin B1  250 mg daily 4/22-->4/28 followed by 100 mg daily IV.  --now on B12 IM+PO, IV folate. Vitamin A  10,000 units daily. Vitamin D  50,000/wk, and MVI --recheck Mg, Phos (supplemented)-- ordered for Monday 4/28--Phos improving, magnesium  stable -5/8 MG low, discussed pharm- IV 2g today recheck Saturday-- see above #7 -5/9-5/11 add mg oxide 400mg  BID supplement MG stable  12. Resting tachycardia: HR has been 110-120 range  at baseline --not on any BB -Start metoprolol  12.5 mg BID -07/23/23 still tachycardic but less, monitor  - intermittent, asymptomatic. Monitor -5/12-15 HR stable, continue to monitor       08/10/2023    5:08 AM 08/09/2023    9:17 PM 08/09/2023    7:26 PM  Vitals with BMI  Systolic 113  107  Diastolic 73  57  Pulse 83 75 75     13. Accelerated HTN/orthostatic hypotension: BP poorly controlled. Continue hydrochlorothiazide  12.5mg  daily and Avapro  150mg  daily -- addition of BB? -4/25 BB started as in #12 -07/23/23 BPs better, monitor; of note, pharmacy note states pt was on amlodipine  previously? Remain off for now, but note this for d/c 4/28 : reduce HCTZ to 6.125 due to recurrent hypokalemia--BP stable 5/1: BUN elevated, blood pressure soft, hypokalemia as above; DC hydrochlorothiazide .  Got 500 cc IV fluid bolus this afternoon. 5-2: Remains low, irbesartan  reduced to 75 mg this AM; DC given ongoing symptomatic orthostasis, can consider resumption once BP comes up.  Will give 1 L IV fluids today and patient thinks she can tolerate TED hose now, so we will add this on.  Will get orthostatics after IV fluid. -5/3-4/25 BPs improving; monitor -5/6-8 BP controlled, continue current regimen -5/9 patient felt to have high diastolic BP.  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 Latanoprost  and alphagan  gtts             --IVIG 04/21- 04/24. Followed by Dr. Curley Double. -07/23/23 humira, retinA, and rhopressa  listed in pharmacy note as "resume as needed in CIR or at discharge"; clarify when/if these need to be restarted while here 4-29: Should be OK to resume Retin A and Rhopressa ; will inquire about bringing from home. -- will check with Optho about Cellcept and Humira resumption.  4-30: Family brought Rhopressa  and Humira in from home; pharmacy holding Humira until clarified by Optho.  Rhopressa  resumed. 5-1: Spoke with Dr. Mason Sole, continue to hold Humira and CellCept pending outpatient follow-up with him shortly after discharge.  15. Urinary retention: Purewick being used--monitor voiding with PVR/bladder scan. Toilet every 4 hours  -4/25 PVR 64, continue to monitor   - 4-28: No recent PVRs documented, patient denies incontinence, monitor  16. Chronic Asthma: Continue Dulera  BID and singulair  10mg  nightly, azelastin BID --Followed by Dr. Idolina Maker.   17. H/o chronic gastritis w/nausea and vomiting: D/c Ibuprofen . Continue Protonix  40 mg daily and Pepcid  40mg  nightly; was on reglan  at home, has PRN compazine  here. Carafate  1g BID.  -- Followed by [facility].  No complaints while at rehab  18. Abnormal LFTs: Normal liver parenchyma and GB sludge noted on abdominal ultrasound 07/16/23.  --Resolving. Recheck in am-pending - 07/23/23 LFTs a bit improved 4/26, repeat CMP tomorrow morning; of note, HepB SAb  reactive, core Ab positive 4-28: LFTs looking better 4/12 improved  19. Peripheral polyneuropathy: Cymbalta  resumed 04/23 as CK trending down. Will check weekly -- See #3 above; may  benefit from transition to Lyrica or trial of Qutenza as outpatient.  Was establishing with Dr. Rayleen Cal  18. Anemia of chronic disease: Likely malabsorption w/ Low iron but elevated TIBC/Ferritin -07/23/23 Hgb 9.9 on Fri, repeat weekly on Mon/Thurs; iron studies done 4/21 showing low TIBC 172, iron 46, ferritin elevated 791.  -5/8 HGB overall stable 8.5 -5/15 stable at 9.0  19. Morbid obesity: BMI 48. Followed by RD/Bariatric nutrition for wt loss. Plan for Bariatric surgery 08/2023.              --Now on aggressive vitamin supplementation as above  20. Hyponatremia: Na down to 132-->question due to IVIG 4/31-4/24.  --May see rise in LFTs again.  -4/25 recheck labs still pending, will ask nursing to call to check on this -07/23/23 Na 131 on 4/26, recheck tomorrow to see trend or need for supplementation  4/28: NA stable 132. 5/1: Na 134, stable>> 3.1 5/2; monitor  5/12 stable at 139 5/15 stable  137  21. Severe tricuspid regurgitation/grade 1 diastolic heart failure   - 4/29: add daily weights with diuretic adjustments--stable -5/2: Weights remain stable, no external edema with DC hydrochlorothiazide .  Continue to monitor closely. -07/30/23 wt down significantly today, wonder if it's inaccurate; monitor trend -5/9 weight is overall stable, continue current Filed Weights   08/07/23 1610 08/08/23 0356 08/09/23 0500  Weight: (!) 144 kg (!) 144 kg 65.2 kg    22. Gout: continue allopurinol  100mg  QD; off colchicine  for now, resume as appropriate   23. Diarrhea/constipation: -07/23/23 pt now having runny stools, thinks it's the food; stop miralax  daily and change to PRN; monitor 4-28: Ongoing liquid stool; encourage p.o. fluids, replete electrolytes as above, and adding fibercon supplemen\t   - 4/29: No Bms  today; slowing down. [place] - 4-30: Diarrhea followed by smears, KUB performed to ensure no overflow incontinence, was normal. 5/1: KUB normal, patient feels she is having adequate bowel movements.   Last bowel movement 5-2, adequate per patient  LBM 5/7, consider additional medication if no BM by tomorrow -08/05/23 LBM 2 days ago per pt, 3 days ago per documentation; will see if she has BM today but if not then will start meds. Encouraged to use miralax  today  -5/13 LBM today continue to follow 24. Dysuria:  -07/29/23 pt stating some dysuria/difficulty with urination; ordered U/A -07/30/23 U/A not done yesterday d/t BMs everytime she urinated; will try urinal today; f/up on results  -5/5/5 U/A 5/3 with 21-50 WBC, many bacteria start kelfex for suspected UTI, culture added 08/04/23 symptoms have improved, Proteus on urine culture, change to Augmentin  and DC keflex  -08/05/23 got 3.5 days of keflex , so augmentin  through 5/14  Symptoms have resolved  25. Anxiety  -xanax  0.25mg  PRN  -5/7 BuSpar  3 times daily 5 mg  5/14 anxiety appears to be doing better, continue current regimen, infrequent use of Xanax     LOS: 21 days A FACE TO FACE EVALUATION WAS PERFORMED  Tiffany Velasquez 08/10/2023, 1:44 PM

## 2023-08-11 ENCOUNTER — Other Ambulatory Visit: Payer: Self-pay | Admitting: *Deleted

## 2023-08-11 DIAGNOSIS — I5032 Chronic diastolic (congestive) heart failure: Secondary | ICD-10-CM

## 2023-08-11 DIAGNOSIS — E519 Thiamine deficiency, unspecified: Secondary | ICD-10-CM

## 2023-08-11 DIAGNOSIS — M609 Myositis, unspecified: Secondary | ICD-10-CM

## 2023-08-11 MED ORDER — THIAMINE MONONITRATE 100 MG PO TABS: 100.0000 mg | ORAL_TABLET | Freq: Every day | ORAL | Status: DC

## 2023-08-11 MED ORDER — SORBITOL 70 % SOLN: 30.0000 mL | Freq: Every day | Status: DC | PRN

## 2023-08-11 MED ORDER — THIAMINE MONONITRATE 100 MG PO TABS
100.0000 mg | ORAL_TABLET | Freq: Every day | ORAL | Status: DC
  Administered 2023-08-11 – 2023-08-16 (×6): 100 mg via ORAL
  Filled 2023-08-11 (×6): qty 1

## 2023-08-11 NOTE — Progress Notes (Signed)
 Occupational Therapy Weekly Progress Note  Patient Details  Name: Tiffany Velasquez MRN: [id number] Date of Birth: 03/09/1996  Beginning of progress report period: Aug 04, 2023 End of progress report period: Aug 11, 2023  Today's Date: 08/11/2023 OT Individual Time: 0905-1000 OT Individual Time Calculation (min): 55 min    Patient has met 3 of 3 short term goals this reporting period. See note below on current level of functioning. Caregiver education scheduled for 5/20, discharge planning is ongoing.   Patient continues to demonstrate the following deficits: muscle weakness, decreased cardiorespiratoy endurance, and decreased standing balance and decreased balance strategies and therefore will continue to benefit from skilled OT intervention to enhance overall performance with BADL, iADL, and Reduce care partner burden.  Patient progressing toward long term goals..  Goals upgraded to Mod I.   OT Short Term Goals Week 3:  OT Short Term Goal 1 (Week 3): Pt will perform 3/3 toileting tasks with CGA + LRAD. OT Short Term Goal 2 (Week 3): Pt will bathe LB with CGA + LRAD. OT Short Term Goal 3 (Week 3): Pt will perform tub/shower transfer with CGA + LRAD. Week 4:  OT Short Term Goal 1 (Week 4): STGs=LTGs due to patient's estimated length of stay.  Skilled Therapeutic Interventions/Progress Updates:  Pt received sitting EOB, RN administering morning medications. Pt with no reports of pain. Sit<>stands during session with close supervision, ambulatory walk-in shower with CGA + no AD. Pt manages UB/LB dressing/bathing with setup/supervision, using figure-4 technique to manage LB. Pt with improved overall experience/affect in comparison to last shower. Time dedicated to verifying DME needs, CSW updated. Pt remained sitting EOB with all immediate needs met.   Therapy Documentation Precautions:  Precautions Precautions: Fall Recall of Precautions/Restrictions: Intact Precaution/Restrictions  Comments: 6 falls recently Restrictions Weight Bearing Restrictions Per Provider Order: No   Therapy/Group: Individual Therapy  Artemus Biles, OTR/L, MSOT  08/11/2023, 6:19 AM

## 2023-08-11 NOTE — Plan of Care (Signed)
 Goals upgraded due to strong functional progress.   Problem: RH Balance Goal: LTG Patient will maintain dynamic standing with ADLs (OT) Description: LTG:  Patient will maintain dynamic standing balance with assist during activities of daily living (OT)  Flowsheets (Taken 08/11/2023 0924) LTG: Pt will maintain dynamic standing balance during ADLs with: Independent with assistive device   Problem: Sit to Stand Goal: LTG:  Patient will perform sit to stand in prep for activites of daily living with assistance level (OT) Description: LTG:  Patient will perform sit to stand in prep for activites of daily living with assistance level (OT) Flowsheets (Taken 08/11/2023 0924) LTG: PT will perform sit to stand in prep for activites of daily living with assistance level: Independent with assistive device   Problem: RH Grooming Goal: LTG Patient will perform grooming w/assist,cues/equip (OT) Description: LTG: Patient will perform grooming with assist, with/without cues using equipment (OT) Flowsheets (Taken 08/11/2023 0924) LTG: Pt will perform grooming with assistance level of: Independent with assistive device    Problem: RH Bathing Goal: LTG Patient will bathe all body parts with assist levels (OT) Description: LTG: Patient will bathe all body parts with assist levels (OT) Flowsheets (Taken 08/11/2023 0924) LTG: Pt will perform bathing with assistance level/cueing: Independent with assistive device    Problem: RH Dressing Goal: LTG Patient will perform upper body dressing (OT) Description: LTG Patient will perform upper body dressing with assist, with/without cues (OT). Flowsheets (Taken 08/11/2023 0924) LTG: Pt will perform upper body dressing with assistance level of: Independent with assistive device Goal: LTG Patient will perform lower body dressing w/assist (OT) Description: LTG: Patient will perform lower body dressing with assist, with/without cues in positioning using equipment  (OT) Flowsheets (Taken 08/11/2023 0924) LTG: Pt will perform lower body dressing with assistance level of: Independent with assistive device   Problem: RH Toileting Goal: LTG Patient will perform toileting task (3/3 steps) with assistance level (OT) Description: LTG: Patient will perform toileting task (3/3 steps) with assistance level (OT)  Flowsheets (Taken 08/11/2023 0924) LTG: Pt will perform toileting task (3/3 steps) with assistance level: Independent with assistive device   Problem: RH Toilet Transfers Goal: LTG Patient will perform toilet transfers w/assist (OT) Description: LTG: Patient will perform toilet transfers with assist, with/without cues using equipment (OT) Flowsheets (Taken 08/11/2023 0924) LTG: Pt will perform toilet transfers with assistance level of: Independent with assistive device   Problem: RH Tub/Shower Transfers Goal: LTG Patient will perform tub/shower transfers w/assist (OT) Description: LTG: Patient will perform tub/shower transfers with assist, with/without cues using equipment (OT) Flowsheets (Taken 08/11/2023 0924) LTG: Pt will perform tub/shower stall transfers with assistance level of: Supervision/Verbal cueing

## 2023-08-11 NOTE — Plan of Care (Signed)
  Problem: Consults Goal: RH GENERAL PATIENT EDUCATION Description: See Patient Education module for education specifics. Outcome: Progressing   Problem: RH BOWEL ELIMINATION Goal: RH STG MANAGE BOWEL WITH ASSISTANCE Description: STG Manage Bowel with supervision Assistance. Outcome: Progressing   Problem: RH BLADDER ELIMINATION Goal: RH STG MANAGE BLADDER WITH ASSISTANCE Description: STG Manage Bladder With supervision Assistance Outcome: Progressing   Problem: RH SKIN INTEGRITY Goal: RH STG SKIN FREE OF INFECTION/BREAKDOWN Description: Manage skin free of infection/breakdown with supervision Outcome: Progressing   Problem: RH SAFETY Goal: RH STG ADHERE TO SAFETY PRECAUTIONS W/ASSISTANCE/DEVICE Description: STG Adhere to Safety Precautions With supervision  Assistance/Device. Outcome: Progressing   Problem: RH PAIN MANAGEMENT Goal: RH STG PAIN MANAGED AT OR BELOW PT'S PAIN GOAL Description: <4 w/ prns Outcome: Progressing   Problem: RH KNOWLEDGE DEFICIT GENERAL Goal: RH STG INCREASE KNOWLEDGE OF SELF CARE AFTER HOSPITALIZATION Description: Manage increase knowledge of self care after hospitalization with supervision from mother using educational materials provided Outcome: Progressing

## 2023-08-11 NOTE — Group Note (Signed)
 Patient Details Name: Tiffany Velasquez MRN: [id number] DOB: 05/11/1995 Today's Date: 08/11/2023  Time Calculation: OT Group Time Calculation OT Group Start Time: 1404 OT Group Stop Time: 1504 OT Group Time Calculation (min): 60 min      Group Description: Stress management: Pt participated in group session with a focus on stress mgmt, education provided on healthy coping strategies, and social interaction. Focus of session on providing coping strategies to manage new diagnosis to allow for improved mental health to increase overall quality of life . Discussed how to break down stressors into "daily hassles," "major life stressors" and "life circumstances" in an effort to allow pts to chunk their stressors into groups and determine where to best put their efforts/time when dealing with stress. Provided active listening, emotional support and therapeutic use of self. Offered education on factors that protect us  against stress such as "daily uplifts," "healthy coping strategies" and "protective factors." Encouraged all group members to make an effort to actively recall one event from their day that was a daily uplift in an effort to protect their mindset from stressors as well as sharing this information with their caregivers to facilitate improved caregiver communication and decrease overall burden of care.  Issued pt handouts on healthy coping strategies to implement into routine.   Individual level documentation: Patient participated with full collaboration during session.   Pain: No pain     Willadean Hark 08/11/2023, 7:41 PM

## 2023-08-11 NOTE — Progress Notes (Signed)
 Physical Therapy Session Note  Patient Details  Name: Tiffany Velasquez MRN: [id number] Date of Birth: Aug 28, 1995  Today's Date: 08/11/2023 PT Individual Time: 1051-1205 PT Individual Time Calculation (min): 74 min   Short Term Goals: Week 3:  PT Short Term Goal 1 (Week 3): STG = LTG due to ELOS  Skilled Therapeutic Interventions/Progress Updates:    Pt presents in room seated EOB. Pt agreeable to PT. Pt denies pain. Session focused on NMR for single limb stability and muscle fiber recruitment as well as gait training for stair negotiation and gait with decreasing UE support, and therapeutic activity for activity tolerance and tolerance to upright. Pt completes transfers throughout session with CGA/supervision from variable surface heights as low as 18". Pt ambulates with rollator on unit without rest break with supervision, ambulating from room to main gym, main gym to day room, and day room back to room at end of session. Pt ambulates without device with CGA ~40' to steps, completes up/down 8 steps with LHR only, lateral stepping method with cues for foot placement to allow for improved stability and efficiency, with CGA. Pt takes extended seated rest break, then completes NMR for single limb stability and muscle fiber recruitment including step ups 6" step x5 BLE forward, x5 BLE lateral with BUE support on handrails. Pt then ambulates with rollator to day room, and participates with NMR for dynamic standing balance with dynamic UE movements and activity tolerance participating with wii sports. Pt able to tolerate standing during activity for 4 minutes and 2 minutes with supervision without UE support and dynamic UE movements, requires intermittent periods of seated rest breaks however pt continues to engage with activity in seated position. Pt returns to room and remains seated EOB with all needs within reach, cal light in place at end of session.   Therapy Documentation Precautions:   Precautions Precautions: Fall Recall of Precautions/Restrictions: Intact Precaution/Restrictions Comments: 6 falls recently Restrictions Weight Bearing Restrictions Per Provider Order: No   Therapy/Group: Individual Therapy  Annia Kilts PT, DPT 08/11/2023, 4:13 PM

## 2023-08-11 NOTE — Progress Notes (Signed)
 [facility] Liaison Note  08/11/2023  KIMI MOCHIZUKI 09/06/1995 [id number]  Location: RN Hospital Liaison screened the patient remotely at [facility].  Insurance: [employer] HMO   TERENCE QUASHIE is a 28 y.o. female who is a Primary Care Patient of [place], Jarome Merritt, DO [facility]. The patient was screened for 30 day readmission hospitalization with noted extreme risk score for unplanned readmission risk with 3 IP/6 ED in 6 months.  The patient was assessed for potential Care Management service needs for post hospital transition for care coordination. Review of patient's electronic medical record reveals patient was admitted with Myositis associated antibody positive. Pt will transition home for self care and out patient [facility] neuro rehab. Liaison attempted outreach call to Mother Lauris Gainous to explain VBCI however unsuccessful and unable to leave a HIPAA for a return call due to the mailbox was full. Will make a referral to the risk for readmission.   Plan: [facility] Liaison will continue to follow progress and disposition to asess for post hospital community care coordination/management needs.  Referral request for community care coordination: Liaison will make a referral for post hospital prevention readmission interventions.    VBCI Care Management/Population Health does not replace or interfere with any arrangements made by the Inpatient Transition of Care team.   For questions contact:   Lilla Reichert, RN, BSN Hospital Liaison [place]   [facility], Population Health Office Hours MTWF  8:00 am-6:00 pm Direct Dial: [phone] mobile [email or web address]

## 2023-08-11 NOTE — Progress Notes (Addendum)
 Occupational Therapy Session Note  Patient Details  Name: RAYVEN RETTIG MRN: [id number] Date of Birth: [birth year]  Today's Date: 08/12/2023 OT Individual Time: 9629-5284 OT Individual Time Calculation (min): 25 min    Short Term Goals: Week 4:  OT Short Term Goal 1 (Week 4): STGs=LTGs due to patient's estimated length of stay.  Skilled Therapeutic Interventions/Progress Updates:  Pt received sitting edge of bed with no complaints of pain. Ambulatory transfer from room<>day room with rollator + supervision. In day room, pt tolerates x2 rounds of ~5 mins of standing while playing Wii games, no UE support and at supervision level. Seated rest-break provided as needed. Pt remained sitting EOB with all immediate needs met.    Therapy Documentation Precautions:  Precautions Precautions: Fall Recall of Precautions/Restrictions: Intact Precaution/Restrictions Comments: 6 falls recently Restrictions Weight Bearing Restrictions Per Provider Order: No   Therapy/Group: Individual Therapy  Artemus Biles, OTR/L, MSOT  08/12/2023, 2:41 PM

## 2023-08-11 NOTE — Progress Notes (Signed)
 PROGRESS NOTE   Subjective/Complaints:  No new complaints or concerns.  Pain has been controlled.  She reports she slept okay last night  ROS: as per HPI. Denies fever, CP, SOB, abd pain, N/V/D/C, or any other complaints at this time.   + burning pain both feet--improved, this is worse at night + Anxiety- improved  Objective:   No results found.   Recent Labs    08/10/23 0357  WBC 4.0  HGB 9.0*  HCT 29.6*  PLT 235    Recent Labs    08/10/23 0357  NA 137  K 3.6  CL 113*  CO2 18*  GLUCOSE 88  BUN 11  CREATININE 0.60  CALCIUM  8.8*    Intake/Output Summary (Last 24 hours) at 08/11/2023 1217 Last data filed at 08/11/2023 0914 Gross per 24 hour  Intake 200 ml  Output --  Net 200 ml        Physical Exam: Vital Signs Blood pressure (!) 135/91, pulse 97, temperature 98.4 F (36.9 C), temperature source Oral, resp. rate 18, height 5\' 9"  (1.753 m), weight (!) 144 kg, last menstrual period 06/27/2023, SpO2 100%.    General: NAD, laying in bed,obese HEENT: Head is normocephalic, atraumatic, membranes moist Neck: Supple without JVD or lymphadenopathy Heart: RRR Chest: CTA bilaterally without wheezes, rales, or rhonchi; no distress. Port R chest  Abdomen: Soft, non-tender, non-distended, normoactive bowel sounds Extremities: No clubbing, cyanosis, Tr b/l edema.  Psych: Appropriate, cooperative, pleasant Skin: Clean and intact without signs of breakdown over exposed surfaces.  Right chest port C-D-I  Neuro:  Awake, alert, oriented x 4.  No apparent cognitive deficits. Strength antigravity 4 out of 5 bilateral upper extremities, 4/5 bilateral hip flexors, 4 out of 5 distal lower extremities. Hypersensitivity to light touch in bilateral feet/ankles has improved.  Otherwise, sensation intact Neuroexam consistent with prior 5/16 No abnormal tone noted   Assessment/Plan: 1. Functional deficits which require 3+  hours per day of interdisciplinary therapy in a comprehensive inpatient rehab setting. Physiatrist is providing close team supervision and 24 hour management of active medical problems listed below. Physiatrist and rehab team continue to assess barriers to discharge/monitor patient progress toward functional and medical goals  Care Tool:  Bathing    Body parts bathed by patient: Right arm, Left arm, Chest, Abdomen, Front perineal area, Buttocks, Right upper leg, Left upper leg, Right lower leg, Left lower leg, Face   Body parts bathed by helper: Buttocks, Right upper leg, Left upper leg, Right lower leg, Left lower leg     Bathing assist Assist Level: Supervision/Verbal cueing     Upper Body Dressing/Undressing Upper body dressing   What is the patient wearing?: Pull over shirt    Upper body assist Assist Level: Set up assist    Lower Body Dressing/Undressing Lower body dressing    Lower body dressing activity did not occur: Environmental limitations (no clothes available) What is the patient wearing?: Underwear/pull up     Lower body assist Assist for lower body dressing: Supervision/Verbal cueing     Toileting Toileting Toileting Activity did not occur (Clothing management and hygiene only): N/A (no void or bm)  Toileting assist Assist for  toileting: Set up assist     Transfers Chair/bed transfer  Transfers assist     Chair/bed transfer assist level: Supervision/Verbal cueing     Locomotion Ambulation   Ambulation assist   Ambulation activity did not occur: Safety/medical concerns  Assist level: Contact Guard/Touching assist Assistive device: Rollator Max distance: 150'   Walk 10 feet activity   Assist  Walk 10 feet activity did not occur: Safety/medical concerns  Assist level: Contact Guard/Touching assist Assistive device: Rollator   Walk 50 feet activity   Assist Walk 50 feet with 2 turns activity did not occur: Safety/medical  concerns  Assist level: Contact Guard/Touching assist Assistive device: Rollator    Walk 150 feet activity   Assist Walk 150 feet activity did not occur: Safety/medical concerns  Assist level: Contact Guard/Touching assist Assistive device: Rollator    Walk 10 feet on uneven surface  activity   Assist Walk 10 feet on uneven surfaces activity did not occur: Safety/medical concerns         Wheelchair     Assist Is the patient using a wheelchair?: No             Wheelchair 50 feet with 2 turns activity    Assist            Wheelchair 150 feet activity     Assist          Blood pressure (!) 135/91, pulse 97, temperature 98.4 F (36.9 C), temperature source Oral, resp. rate 18, height 5\' 9"  (1.753 m), weight (!) 144 kg, last menstrual period 06/27/2023, SpO2 100%.  Medical Problem List and Plan: 1. 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 If unaccessed?             -ELOS/Goals: 2-4 weeks -  min A hopefully             -Continue CIR  - 4/29: Having help from mom/dad at home in discharge. Mod-Max A ADLs with bed-level LB. Biggest barriers are weakness and some self-limiting behaviors. Walked 15 feet with EVA walker. Regular walker STS today.  -Expected DC was adjusted to 5/21-continue with current date  - Follow-up in my outpatient clinic after discharge  - Thiamine  changed to oral-discussed with pharmacy  2.  Antithrombotics: -DVT/anticoagulation:  Pharmaceutical: Lovenox  70mg  daily             -antiplatelet therapy: N/A 3. Pain Management: Has been using oxycodone  prn past 3 days? For foot, leg and back pain- also has Gabapentin  800mg  TID (increased 4/25) - duloxetine  stopped initially due to myositis- 30 mg daily resumed 4/23 -07/23/23 feet  very sensitive, but cymbalta  and gabapentin  recently adjusted, lidocaine  cream not enough so asked that pharmacy send 15g tubes per treatment, to adequately cover the plantar surfaces of her feet to see if we can get relief.  4-28: Remains very sensitive, does feel some improvement 4-29: Patient with some daytime lethargy, pain primarily at night.  Reduce daytime gabapentin  to 600 mg every morning/600 mg q. afternoon, increase nighttime dose to 1000 mg.  Increase duloxetine  to 30 mg twice daily. 4-30: Patient did much better overnight with the above changes. 5-1: Discussed with patient further weaning daytime gabapentin ; she feels good on current regimen and wishes to hold off at this time 5/5 add prn robaxin  for  muscle spams 5/6 tramadol  PRN started 5/7 patient reports pain controlled today other than mild headache improving with Tylenol .  Continue to monitor 5/8 Continue current pain regimen.  Do not want to increase gabapentin  due to concerns of sedation.  Could consider trying Lyrica 5/9 patient reports pain is doing better when she keeps her legs warm.  Denies any pain currently or last night.  Continue current regimen.  Qutenza could be option outpatient if her distal neuropathy pain worsens again.  Overall pain has been controlled so we will hold off on adjustments. -08/06/23 pain improved with tramadol  overnight  5/12-16 pain controlled with tramadol , Robaxin  and gabapentin  current dose.  Continue to monitor  4. Mood/Behavior/Sleep: LCSW to follow for evaluation and support.              -antipsychotic agents: N/A  -Neuropsych consult  -07/23/23 didn't sleep well, has melatonin PRN to use - 4-28: Sleep interrupted by pain as above.  Increasing gabapentin  and duloxetine ; may benefit from transition to Elavil nightly If no improvement 4-30: Sleeping better.  Thinks melatonin as needed is over sedating, advised that this is per her request.  5/15-6 reports still sleeping okay 5.  Neuropsych/cognition: This patient is capable of making decisions on her own behalf. - 4-28: Dr. Cheryll Corti evaluated today; appreciate his professional assessment  6. Skin/Wound Care: Routine pressure relief measures.   - Chest port appears clean, routine dressing changes  7. Fluids/Electrolytes/Nutrition: Monitor I/O. Continue Ensure supplements and vitamin supplementation -07/22/23 hypoK+ 3.4, KCL 40meq po x 1- recheck 4/28 -07/23/23 added labs for tomorrow (Mg/phos, CMP, CK) 4/28: Hypokalemia worsened to 2.9 status post 40 mill equivalents; add 40 mill equivalents KCl PO, +40 mill equivalents IV today.  Repeat BMP tonight and tomorrow a.m. Phos elevated, so we will avoid K-Phos--repeat potassium 3.4 at 6 PM. 4-29: Hypokalemia resolved.  Repeat labs Thursday. 5/1: Potassium back to 2.9.  DC HCTZ.  Add 40 mill equivalents daily p.o. repletion, another 20 mill equivalents IV today.  Repeat labs in AM. 07/29/23 K 3.1 yesterday, will do 20mEq IV once today and continue the 40mEq PO, repeat labs Monday 5/5 K+ up to 3.3, increase to 30meq BID 5/8 K+ up to 3.5 continue supplment, MG low at 1.6, IV Mg 2 g given after discussion with pharmacy.  Recheck tomorrow -08/05/23 Mg 1.9, K 3.5, monitor -5/12 add mg oxide 400mg  BIDsupplement -5/15 magnesium  stable at 1.7, continue supplements  8. Non-specific myositis: Cymbalta  d/c by neurology due to concerns of rhabdomyolysis --Cymbalta  30 mg resumed 04/23 given improving myositis.  --Monitor CK intermittently improved from 1536-->514--> 398 --consider muscle biopsy if symptoms do not improve -07/23/23 CK to be done tomorrow and weekly on Mon 4-28: CK downtrending, continue to trend 5/1: CK has normalized. Scheduled for qMon checks-- might consider d/c'ing checks? -CK checks discontinued  9. Multifocal PNA: Likely aspiration--most significant in lingula and RLL on CT w/o contrast chest 07/18/23 --Hx of chronic intermittent N/V. May need swallow evaluation.  Swallow precautions added.   --started on Augmentin  04/22-->04/25 for 5 day course.  -No apparent signs of respiratory distress  11. Multiple Vitamin deficiencies: Vitamin B1  250 mg daily 4/22-->4/28 followed by 100 mg daily IV.  --now on B12 IM+PO, IV folate. Vitamin A  10,000 units daily. Vitamin D  50,000/wk, and MVI --recheck Mg, Phos (supplemented)-- ordered for Monday 4/28--Phos improving, magnesium  stable -5/8 MG low, discussed pharm- IV 2g today recheck Saturday-- see above #7 -5/9-5/11 add mg oxide 400mg  BID supplement MG stable  12. Resting tachycardia: HR has been 110-120 range at baseline --not on any BB -Start metoprolol  12.5 mg BID -07/23/23 still tachycardic but less, monitor  - intermittent, asymptomatic. Monitor -5/12-16 HR stable, continue to monitor       08/11/2023    6:23 AM 08/10/2023    9:25 PM 08/10/2023    4:07 PM  Vitals with BMI  Systolic 135 111 147  Diastolic 91 78 65  Pulse 97 83 68     13. Accelerated HTN/orthostatic hypotension: BP poorly controlled. Continue hydrochlorothiazide  12.5mg  daily and Avapro  150mg  daily -- addition of BB? -4/25 BB started as in #12 -07/23/23 BPs better, monitor; of note, pharmacy note states pt was on amlodipine  previously? Remain off for now, but note this for d/c 4/28 : reduce HCTZ to 6.125 due to recurrent hypokalemia--BP stable 5/1: BUN elevated, blood pressure soft, hypokalemia as above; DC hydrochlorothiazide .  Got 500 cc IV fluid bolus this afternoon. 5-2: Remains low, irbesartan  reduced to 75 mg this AM; DC given ongoing symptomatic orthostasis, can consider resumption once BP comes up.  Will give 1 L IV fluids today and patient thinks she can tolerate TED hose now, so we will add this on.  Will get orthostatics after IV fluid. -5/3-4/25 BPs improving; monitor -5/6-8 BP controlled, continue current regimen -5/9 patient felt to have high diastolic BP.  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 Latanoprost  and alphagan  gtts             --IVIG 04/21- 04/24. Followed by Dr. Curley Double. -07/23/23 humira, retinA, and rhopressa  listed in pharmacy note as "resume as needed in CIR or at discharge"; clarify when/if these need to be restarted while here 4-29: Should be OK to resume Retin A and Rhopressa ; will inquire about bringing from home. -- will check with Optho about Cellcept and Humira resumption.  4-30: Family brought Rhopressa  and Humira in from home; pharmacy holding Humira until clarified by Optho.  Rhopressa  resumed. 5-1: Spoke with Dr. Mason Sole, continue to hold Humira and CellCept pending outpatient follow-up with him shortly after discharge.  15. Urinary retention: Purewick being used--monitor voiding with PVR/bladder scan. Toilet every 4 hours  -4/25 PVR 64, continue to monitor   - 4-28: No recent PVRs documented, patient denies incontinence, monitor  16. Chronic Asthma: Continue Dulera  BID and singulair  10mg  nightly, azelastin BID --Followed by Dr. Idolina Maker.   17. H/o chronic gastritis w/nausea and vomiting: D/c Ibuprofen . Continue Protonix  40 mg daily and Pepcid  40mg  nightly; was on reglan  at home, has PRN compazine  here. Carafate  1g BID.  -- Followed by [facility].  No complaints while at rehab  18. Abnormal LFTs: Normal liver parenchyma and GB sludge noted on abdominal ultrasound 07/16/23.  --Resolving. Recheck in am-pending - 07/23/23 LFTs a bit improved 4/26, repeat CMP tomorrow  morning; of note, HepB SAb reactive, core Ab positive 4-28: LFTs looking better 4/12 improved  19. Peripheral polyneuropathy: Cymbalta  resumed 04/23 as CK trending  down. Will check weekly -- See #3 above; may benefit from transition to Lyrica or trial of Qutenza as outpatient.  Was establishing with Dr. Rayleen Cal  18. Anemia of chronic disease: Likely malabsorption w/ Low iron but elevated TIBC/Ferritin -07/23/23 Hgb 9.9 on Fri, repeat weekly on Mon/Thurs; iron studies done 4/21 showing low TIBC 172, iron 46, ferritin elevated 791.  -5/8 HGB overall stable 8.5 -5/15 stable at 9.0  19. Morbid obesity: BMI 48. Followed by RD/Bariatric nutrition for wt loss. Plan for Bariatric surgery 08/2023.              --Now on aggressive vitamin supplementation as above  20. Hyponatremia: Na down to 132-->question due to IVIG 4/31-4/24.  --May see rise in LFTs again.  -4/25 recheck labs still pending, will ask nursing to call to check on this -07/23/23 Na 131 on 4/26, recheck tomorrow to see trend or need for supplementation  4/28: NA stable 132. 5/1: Na 134, stable>> 3.1 5/2; monitor  5/12 stable at 139 5/15 stable  137  21. Severe tricuspid regurgitation/grade 1 diastolic heart failure   - 4/29: add daily weights with diuretic adjustments--stable -5/2: Weights remain stable, no external edema with DC hydrochlorothiazide .  Continue to monitor closely. -07/30/23 wt down significantly today, wonder if it's inaccurate; monitor trend -5/16 weights overall stable continue to monitor Filed Weights   08/08/23 0356 08/09/23 0500 08/10/23 1500  Weight: (!) 144 kg 65.2 kg (!) 144 kg    22. Gout: continue allopurinol  100mg  QD; off colchicine  for now, resume as appropriate   23. Diarrhea/constipation: -07/23/23 pt now having runny stools, thinks it's the food; stop miralax  daily and change to PRN; monitor 4-28: Ongoing liquid stool; encourage p.o. fluids, replete electrolytes as above, and adding fibercon  supplemen\t   - 4/29: No Bms today; slowing down. [place] - 4-30: Diarrhea followed by smears, KUB performed to ensure no overflow incontinence, was normal. 5/1: KUB normal, patient feels she is having adequate bowel movements.   Last bowel movement 5-2, adequate per patient  LBM 5/7, consider additional medication if no BM by tomorrow -08/05/23 LBM 2 days ago per pt, 3 days ago per documentation; will see if she has BM today but if not then will start meds. Encouraged to use miralax  today  -5/13 LBM today continue to follow Consider additional medication if no BM today, sorbitol as needed 24. Dysuria:  -07/29/23 pt stating some dysuria/difficulty with urination; ordered U/A -07/30/23 U/A not done yesterday d/t BMs everytime she urinated; will try urinal today; f/up on results  -5/5/5 U/A 5/3 with 21-50 WBC, many bacteria start kelfex for suspected UTI, culture added 08/04/23 symptoms have improved, Proteus on urine culture, change to Augmentin  and DC keflex  -08/05/23 got 3.5 days of keflex , so augmentin  through 5/14  Symptoms have resolved  25. Anxiety  -xanax  0.25mg  PRN  -5/7 BuSpar  3 times daily 5 mg  5/14 anxiety appears to be doing better, continue current regimen, infrequent use of Xanax     LOS: 22 days A FACE TO FACE EVALUATION WAS PERFORMED  Lylia Sand 08/11/2023, 12:17 PM

## 2023-08-11 NOTE — Plan of Care (Signed)
  Problem: RH Balance Goal: LTG Patient will maintain dynamic standing balance (PT) Description: LTG:  Patient will maintain dynamic standing balance with assistance during mobility activities (PT) Flowsheets (Taken 08/11/2023 1619) LTG: Pt will maintain dynamic standing balance during mobility activities with:: Independent with assistive device    Problem: Sit to Stand Goal: LTG:  Patient will perform sit to stand with assistance level (PT) Description: LTG:  Patient will perform sit to stand with assistance level (PT) Flowsheets (Taken 08/11/2023 1619) LTG: PT will perform sit to stand in preparation for functional mobility with assistance level: Independent with assistive device Note: Upgraded due to pt progress   Problem: RH Bed to Chair Transfers Goal: LTG Patient will perform bed/chair transfers w/assist (PT) Description: LTG: Patient will perform bed to chair transfers with assistance (PT). Flowsheets (Taken 08/11/2023 1619) LTG: Pt will perform Bed to Chair Transfers with assistance level: Independent with assistive device  Note: Upgraded due to pt progress   Problem: RH Car Transfers Goal: LTG Patient will perform car transfers with assist (PT) Description: LTG: Patient will perform car transfers with assistance (PT). Flowsheets (Taken 08/11/2023 1619) LTG: Pt will perform car transfers with assist:: Supervision/Verbal cueing   Problem: RH Furniture Transfers Goal: LTG Patient will perform furniture transfers w/assist (OT/PT) Description: LTG: Patient will perform furniture transfers  with assistance (OT/PT). Flowsheets (Taken 08/11/2023 1619) LTG: Pt will perform furniture transfers with assist:: Independent with assistive device  Note: Upgraded due to pt progress   Problem: RH Ambulation Goal: LTG Patient will ambulate in controlled environment (PT) Description: LTG: Patient will ambulate in a controlled environment, # of feet with assistance (PT). Flowsheets (Taken  08/11/2023 1619) LTG: Pt will ambulate in controlled environ  assist needed:: Supervision/Verbal cueing LTG: Ambulation distance in controlled environment: 300 Note: Upgraded due to pt progress Goal: LTG Patient will ambulate in home environment (PT) Description: LTG: Patient will ambulate in home environment, # of feet with assistance (PT). Flowsheets (Taken 08/11/2023 1619) LTG: Pt will ambulate in home environ  assist needed:: Independent with assistive device LTG: Ambulation distance in home environment: 50 Note: Upgraded due to pt progress   Problem: RH Stairs Goal: LTG Patient will ambulate up and down stairs w/assist (PT) Description: LTG: Patient will ambulate up and down # of stairs with assistance (PT) Flowsheets (Taken 08/11/2023 1619) LTG: Pt will ambulate up/down stairs assist needed:: Supervision/Verbal cueing

## 2023-08-12 DIAGNOSIS — K5901 Slow transit constipation: Secondary | ICD-10-CM

## 2023-08-12 NOTE — Progress Notes (Signed)
 PROGRESS NOTE   Subjective/Complaints:  Doing well, slept well, pain doing ok, LBM this morning, urinating fine. Denies any other complaints or concerns.   ROS: as per HPI. Denies fever, CP, SOB, abd pain, N/V/D/C, or any other complaints at this time.   + burning pain both feet--improved, this is worse at night + Anxiety- improved  Objective:   No results found.   Recent Labs    08/10/23 0357  WBC 4.0  HGB 9.0*  HCT 29.6*  PLT 235    Recent Labs    08/10/23 0357  NA 137  K 3.6  CL 113*  CO2 18*  GLUCOSE 88  BUN 11  CREATININE 0.60  CALCIUM  8.8*    Intake/Output Summary (Last 24 hours) at 08/12/2023 1249 Last data filed at 08/12/2023 0916 Gross per 24 hour  Intake 720 ml  Output --  Net 720 ml        Physical Exam: Vital Signs Blood pressure (!) 127/91, pulse 86, temperature 98.3 F (36.8 C), temperature source Oral, resp. rate 18, height 5\' 9"  (1.753 m), weight (!) 144 kg, last menstrual period 06/27/2023, SpO2 100%.    General: NAD, laying in bed,obese HEENT: Head is normocephalic, atraumatic, membranes moist Neck: Supple without JVD or lymphadenopathy Heart: RRR no m/r/g appreciated Chest: CTA bilaterally without wheezes, rales, or rhonchi; no distress. Port R chest  Abdomen: Soft, non-tender, non-distended, normoactive bowel sounds Extremities: No clubbing, cyanosis, Tr b/l edema but improving Psych: Appropriate, cooperative, pleasant, less anxious Skin: Clean and intact without signs of breakdown over exposed surfaces.  Right chest port C-D-I  PRIOR EXAMS: Neuro:  Awake, alert, oriented x 4.  No apparent cognitive deficits. Strength antigravity 4 out of 5 bilateral upper extremities, 4/5 bilateral hip flexors, 4 out of 5 distal lower extremities. Hypersensitivity to light touch in bilateral feet/ankles has improved.  Otherwise, sensation intact Neuroexam consistent with prior 5/16 No  abnormal tone noted   Assessment/Plan: 1. Functional deficits which require 3+ hours per day of interdisciplinary therapy in a comprehensive inpatient rehab setting. Physiatrist is providing close team supervision and 24 hour management of active medical problems listed below. Physiatrist and rehab team continue to assess barriers to discharge/monitor patient progress toward functional and medical goals  Care Tool:  Bathing    Body parts bathed by patient: Right arm, Left arm, Chest, Abdomen, Front perineal area, Buttocks, Right upper leg, Left upper leg, Right lower leg, Left lower leg, Face   Body parts bathed by helper: Buttocks, Right upper leg, Left upper leg, Right lower leg, Left lower leg     Bathing assist Assist Level: Supervision/Verbal cueing     Upper Body Dressing/Undressing Upper body dressing   What is the patient wearing?: Pull over shirt    Upper body assist Assist Level: Set up assist    Lower Body Dressing/Undressing Lower body dressing    Lower body dressing activity did not occur: Environmental limitations (no clothes available) What is the patient wearing?: Underwear/pull up     Lower body assist Assist for lower body dressing: Supervision/Verbal cueing     Toileting Toileting Toileting Activity did not occur (Clothing management and hygiene only): N/A (  no void or bm)  Toileting assist Assist for toileting: Set up assist     Transfers Chair/bed transfer  Transfers assist     Chair/bed transfer assist level: Supervision/Verbal cueing     Locomotion Ambulation   Ambulation assist   Ambulation activity did not occur: Safety/medical concerns  Assist level: Supervision/Verbal cueing Assistive device: Rollator Max distance: 150'   Walk 10 feet activity   Assist  Walk 10 feet activity did not occur: Safety/medical concerns  Assist level: Supervision/Verbal cueing Assistive device: Rollator   Walk 50 feet activity   Assist Walk  50 feet with 2 turns activity did not occur: Safety/medical concerns  Assist level: Supervision/Verbal cueing Assistive device: Rollator    Walk 150 feet activity   Assist Walk 150 feet activity did not occur: Safety/medical concerns  Assist level: Supervision/Verbal cueing Assistive device: Rollator    Walk 10 feet on uneven surface  activity   Assist Walk 10 feet on uneven surfaces activity did not occur: Safety/medical concerns         Wheelchair     Assist Is the patient using a wheelchair?: No             Wheelchair 50 feet with 2 turns activity    Assist            Wheelchair 150 feet activity     Assist          Blood pressure (!) 127/91, pulse 86, temperature 98.3 F (36.8 C), temperature source Oral, resp. rate 18, height 5\' 9"  (1.753 m), weight (!) 144 kg, last menstrual period 06/27/2023, SpO2 100%.  Medical Problem List and Plan: 1. 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 If unaccessed?             -ELOS/Goals: 2-4 weeks -  min A hopefully             -Continue CIR  - 4/29: Having help from mom/dad at home in discharge. Mod-Max A ADLs with bed-level LB. Biggest barriers are weakness and some self-limiting behaviors. Walked 15 feet with EVA walker. Regular walker STS today.  -Expected DC was adjusted to 5/21-continue with current date  - Follow-up in my outpatient clinic after discharge  - Thiamine  changed to oral-discussed with pharmacy  2.  Antithrombotics: -DVT/anticoagulation:  Pharmaceutical: Lovenox  70mg  daily             -antiplatelet therapy: N/A 3. Pain Management: Has been using oxycodone  prn past 3 days? For foot, leg and back pain- also has Gabapentin  800mg  TID (increased 4/25) - duloxetine  stopped initially due to  myositis- 30 mg daily resumed 4/23 -07/23/23 feet very sensitive, but cymbalta  and gabapentin  recently adjusted, lidocaine  cream not enough so asked that pharmacy send 15g tubes per treatment, to adequately cover the plantar surfaces of her feet to see if we can get relief.  4-28: Remains very sensitive, does feel some improvement 4-29: Patient with some daytime lethargy, pain primarily at night.  Reduce daytime gabapentin  to 600 mg every morning/600 mg q. afternoon, increase nighttime dose to 1000 mg.  Increase duloxetine  to 30 mg twice daily. 4-30: Patient did much better overnight with the above changes. 5-1: Discussed with patient further weaning daytime gabapentin ; she feels good on current regimen and wishes to hold off at this time  5/5 add prn robaxin  for muscle spams 5/6 tramadol  PRN started 5/7 patient reports pain controlled today other than mild headache improving with Tylenol .  Continue to monitor 5/8 Continue current pain regimen.  Do not want to increase gabapentin  due to concerns of sedation.  Could consider trying Lyrica 5/9 patient reports pain is doing better when she keeps her legs warm.  Denies any pain currently or last night.  Continue current regimen.  Qutenza could be option outpatient if her distal neuropathy pain worsens again.  Overall pain has been controlled so we will hold off on adjustments. -08/06/23 pain improved with tramadol  overnight -5/12-17 pain controlled with tramadol , Robaxin  and gabapentin  current dose.  Continue to monitor  4. Mood/Behavior/Sleep: LCSW to follow for evaluation and support.              -antipsychotic agents: N/A  -Neuropsych consult  -07/23/23 didn't sleep well, has melatonin PRN to use - 4-28: Sleep interrupted by pain as above.  Increasing gabapentin  and duloxetine ; may benefit from transition to Elavil nightly If no improvement 4-30: Sleeping better.  Thinks melatonin as needed is over sedating, advised that this is per her  request.  5/15-17 reports still sleeping okay 5. Neuropsych/cognition: This patient is capable of making decisions on her own behalf. - 4-28: Dr. Cheryll Corti evaluated today; appreciate his professional assessment  6. Skin/Wound Care: Routine pressure relief measures.   - Chest port appears clean, routine dressing changes  7. Fluids/Electrolytes/Nutrition: Monitor I/O. Continue Ensure supplements and vitamin supplementation -07/22/23 hypoK+ 3.4, KCL 40meq po x 1- recheck 4/28 -07/23/23 added labs for tomorrow (Mg/phos, CMP, CK) 4/28: Hypokalemia worsened to 2.9 status post 40 mill equivalents; add 40 mill equivalents KCl PO, +40 mill equivalents IV today.  Repeat BMP tonight and tomorrow a.m. Phos elevated, so we will avoid K-Phos--repeat potassium 3.4 at 6 PM. 4-29: Hypokalemia resolved.  Repeat labs Thursday. 5/1: Potassium back to 2.9.  DC HCTZ.  Add 40 mill equivalents daily p.o. repletion, another 20 mill equivalents IV today.  Repeat labs in AM. 07/29/23 K 3.1 yesterday, will do 20mEq IV once today and continue the 40mEq PO, repeat labs Monday 5/5 K+ up to 3.3, increase to 30meq BID 5/8 K+ up to 3.5 continue supplment, MG low at 1.6, IV Mg 2 g given after discussion with pharmacy.  Recheck tomorrow -08/05/23 Mg 1.9, K 3.5, monitor -5/12 add mg oxide 400mg  BIDsupplement -5/15 magnesium  stable at 1.7, continue supplements  8. Non-specific myositis: Cymbalta  d/c by neurology due to concerns of rhabdomyolysis --Cymbalta  30 mg resumed 04/23 given improving myositis.  --Monitor CK intermittently improved from 1536-->514--> 398 --consider muscle biopsy if symptoms do not improve -07/23/23 CK to be done tomorrow and weekly on Mon 4-28: CK downtrending, continue to trend 5/1: CK has normalized. Scheduled for qMon checks-- might consider d/c'ing checks? -CK checks discontinued  9. Multifocal PNA: Likely aspiration--most significant in lingula and RLL on CT w/o contrast chest 07/18/23 --Hx of  chronic intermittent N/V. May need swallow evaluation. Swallow precautions added.   --started on Augmentin  04/22-->04/25 for 5 day course.  -No apparent signs of respiratory distress  11. Multiple Vitamin deficiencies: Vitamin B1  250 mg daily 4/22-->4/28 followed by 100 mg daily IV.  --now on B12 IM+PO, IV folate. Vitamin A  10,000 units daily. Vitamin D  50,000/wk, and MVI --recheck Mg, Phos (supplemented)-- ordered for Monday 4/28--Phos improving, magnesium  stable -5/8 MG low, discussed pharm- IV 2g today recheck Saturday-- see above #7 -5/9-5/11 add mg oxide 400mg   BID supplement MG stable  12. Resting tachycardia: HR has been 110-120 range at baseline --not on any BB -Start metoprolol  12.5 mg BID -07/23/23 still tachycardic but less, monitor  - intermittent, asymptomatic. Monitor -5/12-17 HR stable, continue to monitor       08/12/2023    8:51 AM 08/12/2023    5:20 AM 08/12/2023    5:12 AM  Vitals with BMI  Systolic 127 130 161  Diastolic 91 93 83  Pulse 86 87 87     13. Accelerated HTN/orthostatic hypotension: BP poorly controlled. Continue hydrochlorothiazide  12.5mg  daily and Avapro  150mg  daily -- addition of BB? -4/25 BB started as in #12 -07/23/23 BPs better, monitor; of note, pharmacy note states pt was on amlodipine  previously? Remain off for now, but note this for d/c 4/28 : reduce HCTZ to 6.125 due to recurrent hypokalemia--BP stable 5/1: BUN elevated, blood pressure soft, hypokalemia as above; DC hydrochlorothiazide .  Got 500 cc IV fluid bolus this afternoon. 5-2: Remains low, irbesartan  reduced to 75 mg this AM; DC given ongoing symptomatic orthostasis, can consider resumption once BP comes up.  Will give 1 L IV fluids today and patient thinks she can tolerate TED hose now, so we will add this on.  Will get orthostatics after IV fluid. -5/3-4/25 BPs improving; monitor -5/6-8 BP controlled, continue current regimen -5/9 patient felt to have high diastolic BP.  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 Latanoprost  and alphagan  gtts             --IVIG 04/21- 04/24. Followed by Dr. Curley Double. -07/23/23 humira, retinA, and rhopressa  listed in pharmacy note as "resume as needed in CIR or at discharge"; clarify when/if these need to be restarted while here 4-29: Should be OK to resume Retin A and Rhopressa ; will inquire about bringing from home. -- will check with Optho about Cellcept and Humira resumption.  4-30: Family brought Rhopressa  and Humira in from home; pharmacy holding Humira until clarified by Optho.  Rhopressa  resumed. 5-1: Spoke with Dr. Mason Sole, continue to hold Humira and CellCept pending outpatient follow-up with him shortly after discharge.  15. Urinary retention: Purewick being used--monitor voiding with PVR/bladder scan. Toilet every 4 hours  -4/25 PVR 64, continue to monitor   - 4-28: No recent PVRs documented, patient denies incontinence, monitor  16. Chronic Asthma: Continue Dulera  BID and singulair  10mg  nightly, azelastin BID --Followed by Dr. Idolina Maker.   17. H/o chronic gastritis w/nausea and vomiting: D/c Ibuprofen . Continue Protonix  40 mg daily and Pepcid  40mg  nightly; was on reglan  at home, has PRN compazine  here. Carafate  1g BID.  -- Followed by [facility].  No complaints while at rehab  18. Abnormal LFTs: Normal liver parenchyma and GB sludge noted on abdominal ultrasound 07/16/23.  --Resolving. Recheck in  am-pending - 07/23/23 LFTs a bit improved 4/26, repeat CMP tomorrow morning; of note, HepB SAb reactive, core Ab positive 4-28: LFTs looking better 4/12 improved  19. Peripheral polyneuropathy:  Cymbalta  resumed 04/23 as CK trending down. Will check weekly -- See #3 above; may benefit from transition to Lyrica or trial of Qutenza as outpatient.  Was establishing with Dr. Rayleen Cal  18. Anemia of chronic disease: Likely malabsorption w/ Low iron but elevated TIBC/Ferritin -07/23/23 Hgb 9.9 on Fri, repeat weekly on Mon/Thurs; iron studies done 4/21 showing low TIBC 172, iron 46, ferritin elevated 791.  -5/8 HGB overall stable 8.5 -5/15 stable at 9.0  19. Morbid obesity: BMI 48. Followed by RD/Bariatric nutrition for wt loss. Plan for Bariatric surgery 08/2023.              --Now on aggressive vitamin supplementation as above  20. Hyponatremia: Na down to 132-->question due to IVIG 4/31-4/24.  --May see rise in LFTs again.  -4/25 recheck labs still pending, will ask nursing to call to check on this -07/23/23 Na 131 on 4/26, recheck tomorrow to see trend or need for supplementation  4/28: NA stable 132. 5/1: Na 134, stable>> 3.1 5/2; monitor  5/12 stable at 139 5/15 stable  137  21. Severe tricuspid regurgitation/grade 1 diastolic heart failure   - 4/29: add daily weights with diuretic adjustments--stable -5/2: Weights remain stable, no external edema with DC hydrochlorothiazide .  Continue to monitor closely. -07/30/23 wt down significantly today, wonder if it's inaccurate; monitor trend -5/16 weights overall stable continue to monitor Filed Weights   08/08/23 0356 08/09/23 0500 08/10/23 1500  Weight: (!) 144 kg 65.2 kg (!) 144 kg    22. Gout: continue allopurinol  100mg  QD; off colchicine  for now, resume as appropriate   23. Diarrhea/constipation: -07/23/23 pt now having runny stools, thinks it's the food; stop miralax  daily and change to PRN; monitor 4-28: Ongoing liquid stool; encourage  p.o. fluids, replete electrolytes as above, and adding fibercon supplemen\t   - 4/29: No Bms today; slowing down. [place] - 4-30: Diarrhea followed by smears, KUB performed to ensure no overflow incontinence, was normal. 5/1: KUB normal, patient feels she is having adequate bowel movements.   Last bowel movement 5-2, adequate per patient  LBM 5/7, consider additional medication if no BM by tomorrow -08/05/23 LBM 2 days ago per pt, 3 days ago per documentation; will see if she has BM today but if not then will start meds. Encouraged to use miralax  today  -5/13 LBM today continue to follow Consider additional medication if no BM today, sorbitol  as needed -08/12/23 LBM today, monitor 24. Dysuria:  -07/29/23 pt stating some dysuria/difficulty with urination; ordered U/A -07/30/23 U/A not done yesterday d/t BMs everytime she urinated; will try urinal today; f/up on results  -5/5/5 U/A 5/3 with 21-50 WBC, many bacteria start kelfex for suspected UTI, culture added 08/04/23 symptoms have improved, Proteus on urine culture, change to Augmentin  and DC keflex  -08/05/23 got 3.5 days of keflex , so augmentin  through 5/14  Symptoms have resolved  25. Anxiety  -xanax  0.25mg  PRN  -5/7 BuSpar  3 times daily 5 mg 5/14 anxiety appears to be doing better, continue current regimen, infrequent use of Xanax     LOS: 23 days A FACE TO FACE EVALUATION WAS PERFORMED  [address] 08/12/2023, 12:49 PM

## 2023-08-12 NOTE — Plan of Care (Signed)
  Problem: Consults Goal: RH GENERAL PATIENT EDUCATION Description: See Patient Education module for education specifics. Outcome: Progressing   Problem: RH BOWEL ELIMINATION Goal: RH STG MANAGE BOWEL WITH ASSISTANCE Description: STG Manage Bowel with supervision Assistance. Outcome: Progressing   Problem: RH BLADDER ELIMINATION Goal: RH STG MANAGE BLADDER WITH ASSISTANCE Description: STG Manage Bladder With supervision Assistance Outcome: Progressing   Problem: RH SKIN INTEGRITY Goal: RH STG SKIN FREE OF INFECTION/BREAKDOWN Description: Manage skin free of infection/breakdown with supervision Outcome: Progressing   Problem: RH SAFETY Goal: RH STG ADHERE TO SAFETY PRECAUTIONS W/ASSISTANCE/DEVICE Description: STG Adhere to Safety Precautions With supervision  Assistance/Device. Outcome: Progressing   Problem: RH PAIN MANAGEMENT Goal: RH STG PAIN MANAGED AT OR BELOW PT'S PAIN GOAL Description: <4 w/ prns Outcome: Progressing   Problem: RH KNOWLEDGE DEFICIT GENERAL Goal: RH STG INCREASE KNOWLEDGE OF SELF CARE AFTER HOSPITALIZATION Description: Manage increase knowledge of self care after hospitalization with supervision from mother using educational materials provided Outcome: Progressing

## 2023-08-12 NOTE — Plan of Care (Signed)
  Problem: Consults Goal: RH GENERAL PATIENT EDUCATION Description: See Patient Education module for education specifics. 08/12/2023 1954 by Hillard Lowes, LPN Outcome: Progressing 08/12/2023 1915 by Hillard Lowes, LPN Outcome: Progressing   Problem: RH BOWEL ELIMINATION Goal: RH STG MANAGE BOWEL WITH ASSISTANCE Description: STG Manage Bowel with supervision Assistance. 08/12/2023 1954 by Hillard Lowes, LPN Outcome: Progressing 08/12/2023 1915 by Hillard Lowes, LPN Outcome: Progressing   Problem: RH BLADDER ELIMINATION Goal: RH STG MANAGE BLADDER WITH ASSISTANCE Description: STG Manage Bladder With supervision Assistance 08/12/2023 1954 by Hillard Lowes, LPN Outcome: Progressing 08/12/2023 1915 by Hillard Lowes, LPN Outcome: Progressing   Problem: RH SKIN INTEGRITY Goal: RH STG SKIN FREE OF INFECTION/BREAKDOWN Description: Manage skin free of infection/breakdown with supervision 08/12/2023 1954 by Hillard Lowes, LPN Outcome: Progressing 08/12/2023 1915 by Hillard Lowes, LPN Outcome: Progressing   Problem: RH SAFETY Goal: RH STG ADHERE TO SAFETY PRECAUTIONS W/ASSISTANCE/DEVICE Description: STG Adhere to Safety Precautions With supervision  Assistance/Device. 08/12/2023 1954 by Hillard Lowes, LPN Outcome: Progressing 08/12/2023 1915 by Hillard Lowes, LPN Outcome: Progressing   Problem: RH PAIN MANAGEMENT Goal: RH STG PAIN MANAGED AT OR BELOW PT'S PAIN GOAL Description: <4 w/ prns 08/12/2023 1954 by Hillard Lowes, LPN Outcome: Progressing 08/12/2023 1915 by Hillard Lowes, LPN Outcome: Progressing   Problem: RH KNOWLEDGE DEFICIT GENERAL Goal: RH STG INCREASE KNOWLEDGE OF SELF CARE AFTER HOSPITALIZATION Description: Manage increase knowledge of self care after hospitalization with supervision from mother using educational materials provided 08/12/2023 1954 by Hillard Lowes, LPN Outcome: Progressing 08/12/2023 1915 by  Hillard Lowes, LPN Outcome: Progressing

## 2023-08-12 NOTE — Progress Notes (Signed)
 At bedside for routine line care. PAC flushes easily with no blood return. Explained to pt the [facility] would need to be re accessed. States, " I want to wait until tomorrow." Instructed pt the PAC cannot be used safely until good blood return is obtained, however, pt does not want port re accessed at this time. LPN made aware and reports all meds are PO.

## 2023-08-13 MED ORDER — ALTEPLASE 2 MG IJ SOLR
2.0000 mg | Freq: Once | INTRAMUSCULAR | Status: AC
  Administered 2023-08-13: 2 mg
  Filled 2023-08-13: qty 2

## 2023-08-13 NOTE — Progress Notes (Signed)
 PROGRESS NOTE   Subjective/Complaints:  Pt doing well again, slept well, pain managed well, LBM yesterday and then again after eval, urinating fine. Denies any other complaints or concerns. Port access clogged, pt agreeable with unclogging.   ROS: as per HPI. Denies fever, CP, SOB, abd pain, N/V/D/C, or any other complaints at this time.   + burning pain both feet--improved, this is worse at night + Anxiety- improved  Objective:   No results found.   No results for input(s): "WBC", "HGB", "HCT", "PLT" in the last 72 hours.   No results for input(s): "NA", "K", "CL", "CO2", "GLUCOSE", "BUN", "CREATININE", "CALCIUM " in the last 72 hours.   Intake/Output Summary (Last 24 hours) at 08/13/2023 1215 Last data filed at 08/13/2023 0735 Gross per 24 hour  Intake 720 ml  Output --  Net 720 ml        Physical Exam: Vital Signs Blood pressure (!) 125/103, pulse 90, temperature 98.2 F (36.8 C), temperature source Oral, resp. rate 18, height 5\' 9"  (1.753 m), weight (!) 144 kg, last menstrual period 06/27/2023, SpO2 100%.    General: NAD, laying in bed,obese, talking on the phone.  HEENT: Head is normocephalic, atraumatic, membranes moist Neck: Supple without JVD or lymphadenopathy Heart: RRR no m/r/g appreciated Chest: CTA bilaterally without wheezes, rales, or rhonchi; no distress. Port R chest  Abdomen: Soft, non-tender, non-distended, normoactive bowel sounds Extremities: No clubbing, cyanosis, Tr b/l edema but improving Psych: Appropriate, cooperative, pleasant, less anxious Skin: Clean and intact without signs of breakdown over exposed surfaces.  Right chest port C-D-I  PRIOR EXAMS: Neuro:  Awake, alert, oriented x 4.  No apparent cognitive deficits. Strength antigravity 4 out of 5 bilateral upper extremities, 4/5 bilateral hip flexors, 4 out of 5 distal lower extremities. Hypersensitivity to light touch in bilateral  feet/ankles has improved.  Otherwise, sensation intact Neuroexam consistent with prior 5/16 No abnormal tone noted   Assessment/Plan: 1. Functional deficits which require 3+ hours per day of interdisciplinary therapy in a comprehensive inpatient rehab setting. Physiatrist is providing close team supervision and 24 hour management of active medical problems listed below. Physiatrist and rehab team continue to assess barriers to discharge/monitor patient progress toward functional and medical goals  Care Tool:  Bathing    Body parts bathed by patient: Right arm, Left arm, Chest, Abdomen, Front perineal area, Buttocks, Right upper leg, Left upper leg, Right lower leg, Left lower leg, Face   Body parts bathed by helper: Buttocks, Right upper leg, Left upper leg, Right lower leg, Left lower leg     Bathing assist Assist Level: Supervision/Verbal cueing     Upper Body Dressing/Undressing Upper body dressing   What is the patient wearing?: Pull over shirt    Upper body assist Assist Level: Set up assist    Lower Body Dressing/Undressing Lower body dressing    Lower body dressing activity did not occur: Environmental limitations (no clothes available) What is the patient wearing?: Underwear/pull up     Lower body assist Assist for lower body dressing: Supervision/Verbal cueing     Toileting Toileting Toileting Activity did not occur (Clothing management and hygiene only): N/A (no void or bm)  Toileting assist Assist for toileting: Set up assist     Transfers Chair/bed transfer  Transfers assist     Chair/bed transfer assist level: Supervision/Verbal cueing     Locomotion Ambulation   Ambulation assist   Ambulation activity did not occur: Safety/medical concerns  Assist level: Supervision/Verbal cueing Assistive device: Rollator Max distance: 150'   Walk 10 feet activity   Assist  Walk 10 feet activity did not occur: Safety/medical concerns  Assist level:  Supervision/Verbal cueing Assistive device: Rollator   Walk 50 feet activity   Assist Walk 50 feet with 2 turns activity did not occur: Safety/medical concerns  Assist level: Supervision/Verbal cueing Assistive device: Rollator    Walk 150 feet activity   Assist Walk 150 feet activity did not occur: Safety/medical concerns  Assist level: Supervision/Verbal cueing Assistive device: Rollator    Walk 10 feet on uneven surface  activity   Assist Walk 10 feet on uneven surfaces activity did not occur: Safety/medical concerns         Wheelchair     Assist Is the patient using a wheelchair?: No             Wheelchair 50 feet with 2 turns activity    Assist            Wheelchair 150 feet activity     Assist          Blood pressure (!) 125/103, pulse 90, temperature 98.2 F (36.8 C), temperature source Oral, resp. rate 18, height 5\' 9"  (1.753 m), weight (!) 144 kg, last menstrual period 06/27/2023, SpO2 100%.  Medical Problem List and Plan: 1. 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 If unaccessed?             -ELOS/Goals: 2-4 weeks -  min A hopefully             -Continue CIR  - 4/29: Having help from mom/dad at home in discharge. Mod-Max A ADLs with bed-level LB. Biggest barriers are weakness and some self-limiting behaviors. Walked 15 feet with EVA walker. Regular walker STS today.  -Expected DC was adjusted to 5/21-continue with current date  - Follow-up in my outpatient clinic after discharge  - Thiamine  changed to oral-discussed with pharmacy  2.  Antithrombotics: -DVT/anticoagulation:  Pharmaceutical: Lovenox  70mg  daily             -antiplatelet therapy: N/A 3. Pain Management: Has been using oxycodone  prn past 3 days? For foot, leg  and back pain- also has Gabapentin  800mg  TID (increased 4/25) - duloxetine  stopped initially due to myositis- 30 mg daily resumed 4/23 -07/23/23 feet very sensitive, but cymbalta  and gabapentin  recently adjusted, lidocaine  cream not enough so asked that pharmacy send 15g tubes per treatment, to adequately cover the plantar surfaces of her feet to see if we can get relief.  4-28: Remains very sensitive, does feel some improvement 4-29: Patient with some daytime lethargy, pain primarily at night.  Reduce daytime gabapentin  to 600 mg every morning/600 mg q. afternoon, increase nighttime dose to 1000 mg.  Increase duloxetine  to 30 mg twice daily. 4-30: Patient did much better overnight with the above changes. 5-1: Discussed with patient further weaning daytime gabapentin ; she feels good on current regimen and wishes to hold off at this time 5/5 add prn robaxin  for  muscle spams 5/6 tramadol  PRN started 5/7 patient reports pain controlled today other than mild headache improving with Tylenol .  Continue to monitor 5/8 Continue current pain regimen.  Do not want to increase gabapentin  due to concerns of sedation.  Could consider trying Lyrica 5/9 patient reports pain is doing better when she keeps her legs warm.  Denies any pain currently or last night.  Continue current regimen.  Qutenza could be option outpatient if her distal neuropathy pain worsens again.  Overall pain has been controlled so we will hold off on adjustments. -08/06/23 pain improved with tramadol  overnight -5/12-17 pain controlled with tramadol , Robaxin  and gabapentin  current dose.  Continue to monitor  4. Mood/Behavior/Sleep: LCSW to follow for evaluation and support.              -antipsychotic agents: N/A  -Neuropsych consult  -07/23/23 didn't sleep well, has melatonin PRN to use - 4-28: Sleep interrupted by pain as above.  Increasing gabapentin  and duloxetine ; may benefit from transition to Elavil nightly If no improvement 4-30:  Sleeping better.  Thinks melatonin as needed is over sedating, advised that this is per her request.  5/15-18 reports still sleeping okay 5. Neuropsych/cognition: This patient is capable of making decisions on her own behalf. - 4-28: Dr. Cheryll Corti evaluated today; appreciate his professional assessment  6. Skin/Wound Care: Routine pressure relief measures.   - Chest port appears clean, routine dressing changes  7. Fluids/Electrolytes/Nutrition: Monitor I/O. Continue Ensure supplements and vitamin supplementation -07/22/23 hypoK+ 3.4, KCL 40meq po x 1- recheck 4/28 -07/23/23 added labs for tomorrow (Mg/phos, CMP, CK) 4/28: Hypokalemia worsened to 2.9 status post 40 mill equivalents; add 40 mill equivalents KCl PO, +40 mill equivalents IV today.  Repeat BMP tonight and tomorrow a.m. Phos elevated, so we will avoid K-Phos--repeat potassium 3.4 at 6 PM. 4-29: Hypokalemia resolved.  Repeat labs Thursday. 5/1: Potassium back to 2.9.  DC HCTZ.  Add 40 mill equivalents daily p.o. repletion, another 20 mill equivalents IV today.  Repeat labs in AM. 07/29/23 K 3.1 yesterday, will do 20mEq IV once today and continue the 40mEq PO, repeat labs Monday 5/5 K+ up to 3.3, increase to 30meq BID 5/8 K+ up to 3.5 continue supplment, MG low at 1.6, IV Mg 2 g given after discussion with pharmacy.  Recheck tomorrow -08/05/23 Mg 1.9, K 3.5, monitor -5/12 add mg oxide 400mg  BIDsupplement -5/15 magnesium  stable at 1.7, continue supplements  8. Non-specific myositis: Cymbalta  d/c by neurology due to concerns of rhabdomyolysis --Cymbalta  30 mg resumed 04/23 given improving myositis.  --Monitor CK intermittently improved from 1536-->514--> 398 --consider muscle biopsy if symptoms do not improve -07/23/23 CK to be done tomorrow and weekly on Mon 4-28: CK downtrending, continue to trend 5/1: CK has normalized. Scheduled for qMon checks-- might consider d/c'ing checks? -CK checks discontinued  9. Multifocal PNA: Likely  aspiration--most significant in lingula and RLL on CT w/o contrast chest 07/18/23 --Hx of chronic intermittent N/V. May need swallow evaluation. Swallow precautions added.   --started on Augmentin  04/22-->04/25 for 5 day course.  -No apparent signs of respiratory distress  11. Multiple Vitamin deficiencies: Vitamin B1  250 mg daily 4/22-->4/28 followed by 100 mg daily IV.  --now on B12 IM+PO, IV folate. Vitamin A  10,000 units daily. Vitamin D  50,000/wk, and MVI --recheck Mg, Phos (supplemented)-- ordered for Monday 4/28--Phos improving, magnesium  stable -5/8 MG low, discussed pharm- IV 2g today recheck Saturday-- see above #7 -5/9-5/11 add mg oxide 400mg  BID supplement MG stable  12. Resting tachycardia: HR has been 110-120 range at baseline --not on any BB -Start metoprolol  12.5 mg BID -07/23/23 still tachycardic but less, monitor  - intermittent, asymptomatic. Monitor -5/12-18 HR stable, continue to monitor       08/13/2023   10:18 AM 08/13/2023    6:21 AM 08/12/2023    9:37 PM  Vitals with BMI  Systolic 125 127 147  Diastolic 103 90 87  Pulse 90 81 96     13. Accelerated HTN/orthostatic hypotension: BP poorly controlled. Continue hydrochlorothiazide  12.5mg  daily and Avapro  150mg  daily -- addition of BB? -4/25 BB started as in #12 -07/23/23 BPs better, monitor; of note, pharmacy note states pt was on amlodipine  previously? Remain off for now, but note this for d/c 4/28 : reduce HCTZ to 6.125 due to recurrent hypokalemia--BP stable 5/1: BUN elevated, blood pressure soft, hypokalemia as above; DC hydrochlorothiazide .  Got 500 cc IV fluid bolus this afternoon. 5-2: Remains low, irbesartan  reduced to 75 mg this AM; DC given ongoing symptomatic orthostasis, can consider resumption once BP comes up.  Will give 1 L IV fluids today and patient thinks she can tolerate TED hose now, so we will add this on.  Will get orthostatics after IV fluid. -5/3-4/25 BPs improving; monitor -5/6-8 BP  controlled, continue current regimen -5/9 patient felt to have high diastolic BP.  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 Latanoprost  and alphagan  gtts             --IVIG 04/21- 04/24. Followed by Dr. Curley Double. -07/23/23 humira, retinA, and rhopressa  listed in pharmacy note as "resume as needed in CIR or at discharge"; clarify when/if these need to be restarted while here 4-29: Should be OK to resume Retin A and Rhopressa ; will inquire about bringing from home. -- will check with Optho about Cellcept and Humira resumption.  4-30: Family brought Rhopressa  and Humira in from home; pharmacy holding Humira until clarified by Optho.  Rhopressa  resumed. 5-1: Spoke with Dr. Mason Sole, continue to hold Humira and CellCept pending outpatient follow-up with him shortly after discharge.  15. Urinary retention: Purewick being used--monitor voiding with PVR/bladder scan. Toilet every 4 hours  -4/25 PVR 64, continue to monitor   - 4-28: No recent PVRs documented, patient denies incontinence, monitor  16. Chronic Asthma: Continue Dulera  BID and singulair  10mg  nightly, azelastin BID --Followed by Dr. Idolina Maker.   17. H/o chronic gastritis w/nausea and vomiting: D/c Ibuprofen . Continue Protonix  40 mg daily and Pepcid  40mg  nightly; was on reglan  at home, has PRN compazine  here. Carafate  1g BID.  -- Followed by [facility].  No complaints while at rehab  18. Abnormal LFTs: Normal liver  parenchyma and GB sludge noted on abdominal ultrasound 07/16/23.  --Resolving. Recheck in am-pending - 07/23/23 LFTs a bit improved 4/26, repeat CMP tomorrow morning; of note, HepB SAb reactive, core Ab positive 4-28: LFTs looking better 4/12 improved  19. Peripheral polyneuropathy: Cymbalta  resumed 04/23 as CK  trending down. Will check weekly -- See #3 above; may benefit from transition to Lyrica or trial of Qutenza as outpatient.  Was establishing with Dr. Rayleen Cal  18. Anemia of chronic disease: Likely malabsorption w/ Low iron but elevated TIBC/Ferritin -07/23/23 Hgb 9.9 on Fri, repeat weekly on Mon/Thurs; iron studies done 4/21 showing low TIBC 172, iron 46, ferritin elevated 791.  -5/8 HGB overall stable 8.5 -5/15 stable at 9.0  19. Morbid obesity: BMI 48. Followed by RD/Bariatric nutrition for wt loss. Plan for Bariatric surgery 08/2023.              --Now on aggressive vitamin supplementation as above  20. Hyponatremia: Na down to 132-->question due to IVIG 4/31-4/24.  --May see rise in LFTs again.  -4/25 recheck labs still pending, will ask nursing to call to check on this -07/23/23 Na 131 on 4/26, recheck tomorrow to see trend or need for supplementation  4/28: NA stable 132. 5/1: Na 134, stable>> 3.1 5/2; monitor  5/12 stable at 139 5/15 stable  137  21. Severe tricuspid regurgitation/grade 1 diastolic heart failure   - 4/29: add daily weights with diuretic adjustments--stable -5/2: Weights remain stable, no external edema with DC hydrochlorothiazide .  Continue to monitor closely. -07/30/23 wt down significantly today, wonder if it's inaccurate; monitor trend -5/16 weights overall stable continue to monitor Filed Weights   08/08/23 0356 08/09/23 0500 08/10/23 1500  Weight: (!) 144 kg 65.2 kg (!) 144 kg    22. Gout: continue allopurinol  100mg  QD; off colchicine  for now, resume as appropriate   23. Diarrhea/constipation: -07/23/23 pt now having runny stools, thinks it's the  food; stop miralax  daily and change to PRN; monitor 4-28: Ongoing liquid stool; encourage p.o. fluids, replete electrolytes as above, and adding fibercon supplemen\t   - 4/29: No Bms today; slowing down. [place] - 4-30: Diarrhea followed by smears, KUB performed to ensure no overflow incontinence, was normal. 5/1: KUB normal, patient feels she is having adequate bowel movements.   Last bowel movement 5-2, adequate per patient  LBM 5/7, consider additional medication if no BM by tomorrow -08/05/23 LBM 2 days ago per pt, 3 days ago per documentation; will see if she has BM today but if not then will start meds. Encouraged to use miralax  today  -5/13 LBM today continue to follow Consider additional medication if no BM today, sorbitol  as needed -08/13/23 LBM today, monitor 24. Dysuria:  -07/29/23 pt stating some dysuria/difficulty with urination; ordered U/A -07/30/23 U/A not done yesterday d/t BMs everytime she urinated; will try urinal today; f/up on results  -5/5/5 U/A 5/3 with 21-50 WBC, many bacteria start kelfex for suspected UTI, culture added 08/04/23 symptoms have improved, Proteus on urine culture, change to Augmentin  and DC keflex  -08/05/23 got 3.5 days of keflex , so augmentin  through 5/14  Symptoms have resolved  25. Anxiety  -xanax  0.25mg  PRN  -5/7 BuSpar  3 times daily 5 mg 5/14 anxiety appears to be doing better, continue current regimen, infrequent use of Xanax     LOS: 24 days A FACE TO FACE EVALUATION WAS PERFORMED  [address] 08/13/2023, 12:15 PM

## 2023-08-13 NOTE — Plan of Care (Signed)
  Problem: Consults Goal: RH GENERAL PATIENT EDUCATION Description: See Patient Education module for education specifics. Outcome: Progressing   Problem: RH BOWEL ELIMINATION Goal: RH STG MANAGE BOWEL WITH ASSISTANCE Description: STG Manage Bowel with supervision Assistance. Outcome: Progressing   Problem: RH BLADDER ELIMINATION Goal: RH STG MANAGE BLADDER WITH ASSISTANCE Description: STG Manage Bladder With supervision Assistance Outcome: Progressing   Problem: RH SAFETY Goal: RH STG ADHERE TO SAFETY PRECAUTIONS W/ASSISTANCE/DEVICE Description: STG Adhere to Safety Precautions With supervision  Assistance/Device. Outcome: Progressing Note: Does refuse CHGs bath periodically.   Problem: RH PAIN MANAGEMENT Goal: RH STG PAIN MANAGED AT OR BELOW PT'S PAIN GOAL Description: <4 w/ prns Outcome: Progressing   Problem: RH KNOWLEDGE DEFICIT GENERAL Goal: RH STG INCREASE KNOWLEDGE OF SELF CARE AFTER HOSPITALIZATION Description: Manage increase knowledge of self care after hospitalization with supervision from mother using educational materials provided Outcome: Progressing   Problem: RH KNOWLEDGE DEFICIT GENERAL Goal: RH STG INCREASE KNOWLEDGE OF SELF CARE AFTER HOSPITALIZATION Description: Manage increase knowledge of self care after hospitalization with supervision from mother using educational materials provided Outcome: Progressing

## 2023-08-13 NOTE — Progress Notes (Signed)
 Patient refusing CHG bath tonight. Charge RN made aware. Pass along to next nurse taking assignment patient refused CHG bath for the evening.

## 2023-08-14 ENCOUNTER — Other Ambulatory Visit (HOSPITAL_COMMUNITY): Payer: Self-pay

## 2023-08-14 LAB — BASIC METABOLIC PANEL WITH GFR
Anion gap: 4 — ABNORMAL LOW (ref 5–15)
BUN: 9 mg/dL (ref 6–20)
CO2: 19 mmol/L — ABNORMAL LOW (ref 22–32)
Calcium: 9 mg/dL (ref 8.9–10.3)
Chloride: 114 mmol/L — ABNORMAL HIGH (ref 98–111)
Creatinine, Ser: 0.65 mg/dL (ref 0.44–1.00)
GFR, Estimated: >60 mL/min (ref >60)
Glucose, Bld: 86 mg/dL (ref 70–99)
Potassium: 3.8 mmol/L (ref 3.5–5.1)
Sodium: 137 mmol/L (ref 135–145)

## 2023-08-14 LAB — CBC
HCT: 30.7 % — ABNORMAL LOW (ref 36.0–46.0)
Hemoglobin: 9.4 g/dL — ABNORMAL LOW (ref 12.0–15.0)
MCH: 28.9 pg (ref 26.0–34.0)
MCHC: 30.6 g/dL (ref 30.0–36.0)
MCV: 94.5 fL (ref 80.0–100.0)
Platelets: 264 10*3/uL (ref 150–400)
RBC: 3.25 MIL/uL — ABNORMAL LOW (ref 3.87–5.11)
RDW: 18.6 % — ABNORMAL HIGH (ref 11.5–15.5)
WBC: 4.9 10*3/uL (ref 4.0–10.5)
nRBC: 0 % (ref 0.0–0.2)

## 2023-08-14 LAB — MAGNESIUM: Magnesium: 1.8 mg/dL (ref 1.7–2.4)

## 2023-08-14 MED ORDER — GABAPENTIN 300 MG PO CAPS
600.0000 mg | ORAL_CAPSULE | Freq: Two times a day (BID) | ORAL | Status: DC
  Administered 2023-08-15 – 2023-08-16 (×3): 600 mg via ORAL
  Filled 2023-08-14 (×3): qty 2

## 2023-08-14 MED ORDER — GABAPENTIN 300 MG PO CAPS
900.0000 mg | ORAL_CAPSULE | Freq: Every day | ORAL | Status: DC
  Administered 2023-08-14 – 2023-08-15 (×2): 900 mg via ORAL
  Filled 2023-08-14 (×2): qty 3

## 2023-08-14 NOTE — Progress Notes (Signed)
 Physical Therapy Session Note  Patient Details  Name: Tiffany Velasquez MRN: [id number] Date of Birth: [birth year]  Today's Date: 08/14/2023 PT Individual Time: 1353-1447 PT Individual Time Calculation (min): 54 min   Short Term Goals: Week 3:  PT Short Term Goal 1 (Week 3): STG = LTG due to ELOS  Skilled Therapeutic Interventions/Progress Updates:    Pt presents in room supine in bed, slow to awaken but agreeable to PT. Pt denies pain. Session focused on community integration with gait training outside over uneven ground with and without device, NMR for dynamic standing balance with perturbations, and therapeutic activity for education on DC planning. Pt completes transfers with supervision throughout session with and without device. Pt transported outside via [facility] with pt able to manage rollator during transport, positioned in sitting next to reflection fountain. Pt ambulates outside ~200' with rollator over uneven surfaces, therapist providing education on rollator features such as brakes for navigating declines with pt demonstrating understanding, requires close supervision for gait. Pt then ambulates without device ~200' with CGA, requires one standing rest break, pt demonstrating increased postural sway but no LOB. Pt requires extended seated rest break due to heat of outside and educated on energy conservation with pt verbalizing understanding. Pt provided with education on navigating community spaces with rollator, therapist demonstrates folding up rollator for transport and storage with pt verbalzing understanding. Pt transported to public restroom and therapist provides education and instruction on how to navigate public restroom with rollator if handicap stall is unavailable, pt demonstrates placing rollator to side in restroom and ambulating into nearest stall without device with supervision, able to manage bathroom door without assist. Pt then ambulates with rollator through push/pull door, able  to manage with rollator with cues, supervision. Pt transported back to unit and positioned in day room. Pt completes gait 175' with tidal tank with CGA, completed as NMR to improve postural stability with perturbations. Pt ambulates back to room with rollator with distant supervision and returns to sitting on EOB. Pt remains seated on EOB with all needs within reach, call light in place at end of session.  Therapy Documentation Precautions:  Precautions Precautions: Fall Recall of Precautions/Restrictions: Intact Precaution/Restrictions Comments: 6 falls recently Restrictions Weight Bearing Restrictions Per Provider Order: No    Therapy/Group: Individual Therapy  Annia Kilts 08/14/2023, 2:49 PM

## 2023-08-14 NOTE — Progress Notes (Signed)
 Occupational Therapy Session Note  Patient Details  Name: Tiffany Velasquez MRN: [id number] Date of Birth: January 20, 1996  Today's Date: 08/14/2023 OT Individual Time: 0820-0930 OT Individual Time Calculation (min): 70 min    Short Term Goals: Week 4:  OT Short Term Goal 1 (Week 4): STGs=LTGs due to patient's estimated length of stay.  Skilled Therapeutic Interventions/Progress Updates:    Pt received supine with no c/o pain, agreeable to OT session but very tired and requiring encouragement. She came to EOB with mod I. Ambulatory transfer to the w/c with (S). Pt able to direct care and set up for bathing sink side. She completed oral care with mod I seated. She was able to complete ADLs sit <> stand in the w/c with distant (S), using LH sponge and w/c at the sink. Excellent safety awareness throughout and awareness of limitations. She transferred back to EOB and dressed/applied lotion with (S) overall. She completed 100 ft of functional mobility to the therapy gym with (S). She worked on strength based circuit to challenge UE strengthening, core stability, and balance. In standing she held bilateral 6 lb dumbbells and complete curl to press combination. OT guarded posteriorly to ensure safety with dumbbells overhead. She then completed 125 ft of functional mobility holding 7 lb dumbbells to challenge dynamic balance and functional activity tolerance for carryover to ADLs/IADLs. 2 trials completed. She ended with 120 ft of functional mobility with the rollator with (S). She required a seated rest break at 70 ft using the rollator- appropriate positioning. She was left sitting EOB with all needs met.    Therapy Documentation Precautions:  Precautions Precautions: Fall Recall of Precautions/Restrictions: Intact Precaution/Restrictions Comments: 6 falls recently Restrictions Weight Bearing Restrictions Per Provider Order: No  Therapy/Group: Individual Therapy  Una Ganser 08/14/2023, 8:30 AM

## 2023-08-14 NOTE — Progress Notes (Signed)
 Occupational Therapy Session Note  Patient Details  Name: Tiffany Velasquez MRN: [id number] Date of Birth: 1995-10-24  Today's Date: 08/15/2023 OT Individual Time: 1105-1200 OT Individual Time Calculation (min): 55 min   Today's Date: 08/15/2023 OT Individual Time: 1305-1400 OT Individual Time Calculation (min): 55 min   Short Term Goals: Week 4:  OT Short Term Goal 1 (Week 4): STGs=LTGs due to patient's estimated length of stay.  Skilled Therapeutic Interventions/Progress Updates:   Session 1: Pt received resting in bed, no complaints of pain, session focused on BUE strengthening for carryover into ADL participation and functional transfers. Pt ambulates to all therapy locations with supervision progressing to Mod I with use of rollator. Pt performs sit<>stand from low love seat with supervision, unilateral support on arm-rest. In ortho gym, pt instructed in the following exercises,  Punches (2x10) Shoulder press (2x10) Lateral Raises (2x8) Bicep curls (2x15)  Pt utilizes 2#-5# DB with multimodal cuing required for correct muscle activation. Pt made Mod I in room, communicated this to LPN. Pt remained sitting EOB with all immediate needs met.   Session 2: Pt received sitting EOB with family present for caregiver education with focus on OT role, OT POC, and current patient functioning. Functional transfers and LB dressing/bathing AE reviewed. All questions answered at end of session. Pt remained sitting EOB with all immediate needs.   Therapy Documentation Precautions:  Precautions Precautions: Fall Recall of Precautions/Restrictions: Intact Precaution/Restrictions Comments: 6 falls recently Restrictions Weight Bearing Restrictions Per Provider Order: No   Therapy/Group: Individual Therapy  Artemus Biles, OTR/L, MSOT  08/15/2023, 11:35 AM

## 2023-08-14 NOTE — Progress Notes (Signed)
 Patient refused CHG bath.  Jenetta Misty, RN

## 2023-08-14 NOTE — Plan of Care (Signed)
  Problem: Consults Goal: RH GENERAL PATIENT EDUCATION Description: See Patient Education module for education specifics. Outcome: Progressing   Problem: RH BOWEL ELIMINATION Goal: RH STG MANAGE BOWEL WITH ASSISTANCE Description: STG Manage Bowel with supervision Assistance. Outcome: Progressing   Problem: RH BLADDER ELIMINATION Goal: RH STG MANAGE BLADDER WITH ASSISTANCE Description: STG Manage Bladder With supervision Assistance Outcome: Progressing   Problem: RH SKIN INTEGRITY Goal: RH STG SKIN FREE OF INFECTION/BREAKDOWN Description: Manage skin free of infection/breakdown with supervision Outcome: Progressing   Problem: RH SAFETY Goal: RH STG ADHERE TO SAFETY PRECAUTIONS W/ASSISTANCE/DEVICE Description: STG Adhere to Safety Precautions With supervision  Assistance/Device. Outcome: Progressing   Problem: RH PAIN MANAGEMENT Goal: RH STG PAIN MANAGED AT OR BELOW PT'S PAIN GOAL Description: <4 w/ prns Outcome: Progressing   Problem: RH KNOWLEDGE DEFICIT GENERAL Goal: RH STG INCREASE KNOWLEDGE OF SELF CARE AFTER HOSPITALIZATION Description: Manage increase knowledge of self care after hospitalization with supervision from mother using educational materials provided Outcome: Progressing

## 2023-08-14 NOTE — Progress Notes (Signed)
 PROGRESS NOTE   Subjective/Complaints:  Vital stable overnight.  A.m. labs stable.  Patient seen in therapy gym working on arm extensions, feeling really good, stable on current medication regimen.  Sleeping well, pain well-controlled, eating well.  No questions or concerns.  ROS: as per HPI. Denies fever, CP, SOB, abd pain, N/V/D/C, or any other complaints at this time.   + burning pain both feet--improved  + Anxiety- improved  Objective:   No results found.   Recent Labs    08/14/23 0525  WBC 4.9  HGB 9.4*  HCT 30.7*  PLT 264     Recent Labs    08/14/23 0525  NA 137  K 3.8  CL 114*  CO2 19*  GLUCOSE 86  BUN 9  CREATININE 0.65  CALCIUM  9.0     Intake/Output Summary (Last 24 hours) at 08/14/2023 0802 Last data filed at 08/13/2023 1812 Gross per 24 hour  Intake 960 ml  Output --  Net 960 ml        Physical Exam: Vital Signs Blood pressure 129/89, pulse 87, temperature 98 F (36.7 C), resp. rate 18, height 5\' 9"  (1.753 m), weight (!) 142.6 kg, last menstrual period 06/27/2023, SpO2 100%.    General: NAD, laying in bed,obese, sitting in therapy gym. HEENT: Head is normocephalic, atraumatic, membranes moist Neck: Supple without JVD or lymphadenopathy Heart: RRR no m/r/g appreciated Chest: CTA bilaterally without wheezes, rales, or rhonchi; no distress. Port R chest  Abdomen: Soft, non-tender, non-distended, normoactive bowel sounds Extremities: No clubbing, cyanosis, Tr b/l edema but improving Psych: Appropriate, cooperative, pleasant, less anxious Skin: Clean and intact without signs of breakdown over exposed surfaces.  Right chest port C-D-I  Neuro:  Awake, alert, oriented x 4.  No apparent cognitive deficits. Strength antigravity 4 out of 5 bilateral upper extremities, 4/5 bilateral hip flexors, 4 out of 5 distal lower extremities.--Unchanged 5-19 Hypersensitivity to light touch in bilateral  feet/ankles mild and much improved No abnormal tone noted   Assessment/Plan: 1. Functional deficits which require 3+ hours per day of interdisciplinary therapy in a comprehensive inpatient rehab setting. Physiatrist is providing close team supervision and 24 hour management of active medical problems listed below. Physiatrist and rehab team continue to assess barriers to discharge/monitor patient progress toward functional and medical goals  Care Tool:  Bathing    Body parts bathed by patient: Right arm, Left arm, Chest, Abdomen, Front perineal area, Buttocks, Right upper leg, Left upper leg, Right lower leg, Left lower leg, Face   Body parts bathed by helper: Buttocks, Right upper leg, Left upper leg, Right lower leg, Left lower leg     Bathing assist Assist Level: Supervision/Verbal cueing     Upper Body Dressing/Undressing Upper body dressing   What is the patient wearing?: Pull over shirt    Upper body assist Assist Level: Set up assist    Lower Body Dressing/Undressing Lower body dressing    Lower body dressing activity did not occur: Environmental limitations (no clothes available) What is the patient wearing?: Underwear/pull up     Lower body assist Assist for lower body dressing: Supervision/Verbal cueing     Toileting Toileting Toileting Activity did  not occur Press photographer and hygiene only): N/A (no void or bm)  Toileting assist Assist for toileting: Set up assist     Transfers Chair/bed transfer  Transfers assist     Chair/bed transfer assist level: Supervision/Verbal cueing     Locomotion Ambulation   Ambulation assist   Ambulation activity did not occur: Safety/medical concerns  Assist level: Supervision/Verbal cueing Assistive device: Rollator Max distance: 150'   Walk 10 feet activity   Assist  Walk 10 feet activity did not occur: Safety/medical concerns  Assist level: Supervision/Verbal cueing Assistive device: Rollator    Walk 50 feet activity   Assist Walk 50 feet with 2 turns activity did not occur: Safety/medical concerns  Assist level: Supervision/Verbal cueing Assistive device: Rollator    Walk 150 feet activity   Assist Walk 150 feet activity did not occur: Safety/medical concerns  Assist level: Supervision/Verbal cueing Assistive device: Rollator    Walk 10 feet on uneven surface  activity   Assist Walk 10 feet on uneven surfaces activity did not occur: Safety/medical concerns         Wheelchair     Assist Is the patient using a wheelchair?: No             Wheelchair 50 feet with 2 turns activity    Assist            Wheelchair 150 feet activity     Assist          Blood pressure 129/89, pulse 87, temperature 98 F (36.7 C), resp. rate 18, height 5\' 9"  (1.753 m), weight (!) 142.6 kg, last menstrual period 06/27/2023, SpO2 100%.  Medical Problem List and Plan: 1. 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 If unaccessed?             -ELOS/Goals: 2-4 weeks -  min A hopefully             -Continue CIR  - 4/29: Having help from mom/dad at home in discharge. Mod-Max A ADLs with bed-level LB. Biggest barriers are weakness and some self-limiting behaviors. Walked 15 feet with EVA walker. Regular walker STS today.  -Expected DC was adjusted to 5/21-continue with current date  - Follow-up in my outpatient clinic after discharge  - Thiamine  changed to oral-discussed with pharmacy  2.  Antithrombotics: -DVT/anticoagulation:  Pharmaceutical: Lovenox  70mg  daily             -antiplatelet therapy: N/A 3. Pain Management: Has been using oxycodone  prn past 3 days? For foot, leg and back pain- also has Gabapentin  800mg  TID (increased 4/25) - duloxetine  stopped  initially due to myositis- 30 mg daily resumed 4/23 -07/23/23 feet very sensitive, but cymbalta  and gabapentin  recently adjusted, lidocaine  cream not enough so asked that pharmacy send 15g tubes per treatment, to adequately cover the plantar surfaces of her feet to see if we can get relief.  4-28: Remains very sensitive, does feel some improvement 4-29: Patient with some daytime lethargy, pain primarily at night.  Reduce daytime gabapentin  to 600 mg every morning/600 mg q. afternoon, increase nighttime dose to 1000 mg.  Increase duloxetine  to 30 mg twice daily. 4-30: Patient did much better overnight with the above changes. 5-1: Discussed with patient further weaning daytime gabapentin ; she feels good on current regimen and wishes to hold  off at this time 5/5 add prn robaxin  for muscle spams 5/6 tramadol  PRN started 5/7 patient reports pain controlled today other than mild headache improving with Tylenol .  Continue to monitor 5/8 Continue current pain regimen.  Do not want to increase gabapentin  due to concerns of sedation.  Could consider trying Lyrica 5/9 patient reports pain is doing better when she keeps her legs warm.  Denies any pain currently or last night.  Continue current regimen.  Qutenza could be option outpatient if her distal neuropathy pain worsens again.  Overall pain has been controlled so we will hold off on adjustments. -08/06/23 pain improved with tramadol  overnight -5/12-17 pain controlled with tramadol , Robaxin  and gabapentin  current dose.  Continue to monitor 5-19: Will adjust gabapentin  to 900 mg nightly tonight for ease of prescription as outpatient  4. Mood/Behavior/Sleep: LCSW to follow for evaluation and support.              -antipsychotic agents: N/A  -Neuropsych consult  -07/23/23 didn't sleep well, has melatonin PRN to use - 4-28: Sleep interrupted by pain as above.  Increasing gabapentin  and duloxetine ; may benefit from transition to Elavil nightly If no  improvement 4-30: Sleeping better.  Thinks melatonin as needed is over sedating, advised that this is per her request.   5. Neuropsych/cognition: This patient is capable of making decisions on her own behalf. - 4-28: Dr. Cheryll Corti evaluated today; appreciate his professional assessment  6. Skin/Wound Care: Routine pressure relief measures.   - Chest port appears clean, routine dressing changes  7. Fluids/Electrolytes/Nutrition: Monitor I/O. Continue Ensure supplements and vitamin supplementation -07/22/23 hypoK+ 3.4, KCL 40meq po x 1- recheck 4/28 -07/23/23 added labs for tomorrow (Mg/phos, CMP, CK) 4/28: Hypokalemia worsened to 2.9 status post 40 mill equivalents; add 40 mill equivalents KCl PO, +40 mill equivalents IV today.  Repeat BMP tonight and tomorrow a.m. Phos elevated, so we will avoid K-Phos--repeat potassium 3.4 at 6 PM. 4-29: Hypokalemia resolved.  Repeat labs Thursday. 5/1: Potassium back to 2.9.  DC HCTZ.  Add 40 mill equivalents daily p.o. repletion, another 20 mill equivalents IV today.  Repeat labs in AM. 07/29/23 K 3.1 yesterday, will do 20mEq IV once today and continue the 40mEq PO, repeat labs Monday 5/5 K+ up to 3.3, increase to 30meq BID 5/8 K+ up to 3.5 continue supplment, MG low at 1.6, IV Mg 2 g given after discussion with pharmacy.  Recheck tomorrow -08/05/23 Mg 1.9, K 3.5, monitor -5/12 add mg oxide 400mg  BIDsupplement -5/15 magnesium  stable at 1.7, continue supplements 5-19: Magnesium  1.8  8. Non-specific myositis: Cymbalta  d/c by neurology due to concerns of rhabdomyolysis --Cymbalta  30 mg resumed 04/23 given improving myositis.  --Monitor CK intermittently improved from 1536-->514--> 398 --consider muscle biopsy if symptoms do not improve -07/23/23 CK to be done tomorrow and weekly on Mon 4-28: CK downtrending, continue to trend 5/1: CK has normalized. Scheduled for qMon checks-- might consider d/c'ing checks? -CK checks discontinued  9. Multifocal PNA:  Likely aspiration--most significant in lingula and RLL on CT w/o contrast chest 07/18/23 --Hx of chronic intermittent N/V. May need swallow evaluation. Swallow precautions added.   --started on Augmentin  04/22-->04/25 for 5 day course.  -No apparent signs of respiratory distress  11. Multiple Vitamin deficiencies: Vitamin B1  250 mg daily 4/22-->4/28 followed by 100 mg daily IV.  --now on B12 IM+PO, IV folate. Vitamin A  10,000 units daily. Vitamin D  50,000/wk, and MVI --recheck Mg, Phos (supplemented)-- ordered for Monday 4/28--Phos improving, magnesium  stable -  5/8 MG low, discussed pharm- IV 2g today recheck Saturday-- see above #7 -5/9-5/11 add mg oxide 400mg  BID supplement MG stable  12. Resting tachycardia: HR has been 110-120 range at baseline --not on any BB -Start metoprolol  12.5 mg BID -07/23/23 still tachycardic but less, monitor  - intermittent, asymptomatic. Monitor -5/12-18 HR stable, continue to monitor       08/14/2023    6:36 AM 08/14/2023    6:35 AM 08/13/2023    8:56 PM  Vitals with BMI  Weight 314 lbs 6 oz    BMI 46.4    Systolic  129 116  Diastolic  89 82  Pulse  87 85     13. Accelerated HTN/orthostatic hypotension: BP poorly controlled. Continue hydrochlorothiazide  12.5mg  daily and Avapro  150mg  daily -- addition of BB? -4/25 BB started as in #12 -07/23/23 BPs better, monitor; of note, pharmacy note states pt was on amlodipine  previously? Remain off for now, but note this for d/c 4/28 : reduce HCTZ to 6.125 due to recurrent hypokalemia--BP stable 5/1: BUN elevated, blood pressure soft, hypokalemia as above; DC hydrochlorothiazide .  Got 500 cc IV fluid bolus this afternoon. 5-2: Remains low, irbesartan  reduced to 75 mg this AM; DC given ongoing symptomatic orthostasis, can consider resumption once BP comes up.  Will give 1 L IV fluids today and patient thinks she can tolerate TED hose now, so we will add this on.  Will get orthostatics after IV fluid. -5/3-4/25  BPs improving; monitor -5/6-8 BP controlled, continue current regimen -5/9 patient felt to have high diastolic BP.  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 Latanoprost  and alphagan  gtts             --IVIG 04/21- 04/24. Followed by Dr. Curley Double. -07/23/23 humira, retinA, and rhopressa  listed in pharmacy note as "resume as needed in CIR or at discharge"; clarify when/if these need to be restarted while here 4-29: Should be OK to resume Retin A and Rhopressa ; will inquire about bringing from home. -- will check with Optho about Cellcept and Humira resumption.  4-30: Family brought Rhopressa  and Humira in from home; pharmacy holding Humira until clarified by Optho.  Rhopressa  resumed. 5-1: Spoke with Dr. Mason Sole, continue to hold Humira and CellCept pending outpatient follow-up with him shortly after discharge.  15. Urinary retention: Purewick being used--monitor voiding with PVR/bladder scan. Toilet every 4 hours  -4/25 PVR 64, continue to monitor   - 4-28: No recent PVRs documented, patient denies incontinence, monitor  16. Chronic Asthma: Continue Dulera  BID and singulair  10mg  nightly, azelastin BID --Followed by Dr. Idolina Maker.   17. H/o chronic gastritis w/nausea and vomiting: D/c Ibuprofen . Continue Protonix  40 mg daily and Pepcid  40mg  nightly; was on reglan  at home, has PRN compazine  here. Carafate  1g BID.  -- Followed by [facility].  No complaints while at rehab  18.  Abnormal LFTs: Normal liver parenchyma and GB sludge noted on abdominal ultrasound 07/16/23.  --Resolving. Recheck  in am-pending - 07/23/23 LFTs a bit improved 4/26, repeat CMP tomorrow morning; of note, HepB SAb reactive, core Ab positive 4-28: LFTs looking better 4/12 improved  19. Peripheral polyneuropathy: Cymbalta  resumed 04/23 as CK trending down. Will check weekly -- See #3 above; may benefit from transition to Lyrica or trial of Qutenza as outpatient.  Was establishing with Dr. Rayleen Cal  18. Anemia of chronic disease: Likely malabsorption w/ Low iron but elevated TIBC/Ferritin -07/23/23 Hgb 9.9 on Fri, repeat weekly on Mon/Thurs; iron studies done 4/21 showing low TIBC 172, iron 46, ferritin elevated 791.  -5/8 HGB overall stable 8.5 -5/15 stable at 9.0  19. Morbid obesity: BMI 48. Followed by RD/Bariatric nutrition for wt loss. Plan for Bariatric surgery 08/2023.              --Now on aggressive vitamin supplementation as above  20. Hyponatremia: Na down to 132-->question due to IVIG 4/31-4/24.  --May see rise in LFTs again.  -4/25 recheck labs still pending, will ask nursing to call to check on this -07/23/23 Na 131 on 4/26, recheck tomorrow to see trend or need for supplementation  4/28: NA stable 132. 5/1: Na 134, stable>> 3.1 5/2; monitor  5/12 stable at 139 5/15 stable  137  21. Severe tricuspid regurgitation/grade 1 diastolic heart failure   - 4/29: add daily weights with diuretic adjustments--stable -5/2: Weights remain stable, no external edema with DC hydrochlorothiazide .  Continue to monitor closely. -07/30/23 wt down significantly today, wonder if it's inaccurate; monitor trend -5/16 weights overall stable continue to monitor 5-19: Current renal weights unreliable. Filed Weights   08/09/23 0500 08/10/23 1500 08/14/23 0636  Weight: 65.2 kg (!) 144 kg (!) 142.6 kg    22. Gout: continue allopurinol  100mg  QD; off colchicine  for now, resume as appropriate   23.  Diarrhea/constipation: -07/23/23 pt now having runny stools, thinks it's the food; stop miralax  daily and change to PRN; monitor 4-28: Ongoing liquid stool; encourage p.o. fluids, replete electrolytes as above, and adding fibercon supplemen\t   - 4/29: No Bms today; slowing down. [place] - 4-30: Diarrhea followed by smears, KUB performed to ensure no overflow incontinence, was normal. 5/1: KUB normal, patient feels she is having adequate bowel movements.   Last bowel movement 5-2, adequate per patient  LBM 5/7, consider additional medication if no BM by tomorrow -08/05/23 LBM 2 days ago per pt, 3 days ago per documentation; will see if she has BM today but if not then will start meds. Encouraged to use miralax  today  -5/13 LBM today continue to follow Consider additional medication if no BM today, sorbitol  as needed -08/13/23 LBM today, monitor 24. Dysuria:  -07/29/23 pt stating some dysuria/difficulty with urination; ordered U/A -07/30/23 U/A not done yesterday d/t BMs everytime she urinated; will try urinal today; f/up on results  -5/5/5 U/A 5/3 with 21-50 WBC, many bacteria start kelfex for suspected UTI, culture added 08/04/23 symptoms have improved, Proteus on urine culture, change to Augmentin  and DC keflex  -08/05/23 got 3.5 days of keflex , so augmentin  through 5/14  Symptoms have resolved  25. Anxiety  -xanax  0.25mg  PRN  -5/7 BuSpar  3 times daily 5 mg 5/14 anxiety appears to be doing better, continue current regimen, infrequent use of Xanax --would discontinue at discharge    LOS: 25 days A FACE TO FACE EVALUATION WAS PERFORMED  Bea Lime 08/14/2023, 8:02 AM

## 2023-08-14 NOTE — Group Note (Signed)
 Patient Details Name: Tiffany Velasquez MRN: [id number] DOB: June 15, 1995 Today's Date: 08/14/2023  Time Calculation: OT Group Time Calculation OT Group Start Time: 1105 OT Group Stop Time: 1205 OT Group Time Calculation (min): 60 min      Group Description:  Pain reported during session as 0/10. Patient actively participated in the [employer] program in a group setting for social participation. Session focused on education and training in breathing techniques to regulate the nervous system, gentle yoga poses with a focus on standing balance, guided meditation to regulate attention and guided discussion on the topic of "feeling whole." Patient required MOD cues and supervision assist for various standing poses. Pt transported back to room by RT.    Pain: No pain  Willadean Hark 08/14/2023, 12:31 PM

## 2023-08-15 ENCOUNTER — Other Ambulatory Visit (HOSPITAL_COMMUNITY): Payer: Self-pay

## 2023-08-15 MED ORDER — CYANOCOBALAMIN 1000 MCG/ML IJ SOLN
1000.0000 ug | INTRAMUSCULAR | 0 refills | Status: DC
  Filled 2023-08-15: qty 1, 30d supply, fill #0

## 2023-08-15 MED ORDER — METHOCARBAMOL 500 MG PO TABS
500.0000 mg | ORAL_TABLET | Freq: Three times a day (TID) | ORAL | 0 refills | Status: AC | PRN
  Filled 2023-08-15: qty 30, 10d supply, fill #0

## 2023-08-15 MED ORDER — VITAMIN A 3 MG (10000 UNIT) PO CAPS
10000.0000 [IU] | ORAL_CAPSULE | Freq: Every day | ORAL | 0 refills | Status: AC
  Filled 2023-08-15: qty 30, 30d supply, fill #0

## 2023-08-15 MED ORDER — TRAMADOL HCL 50 MG PO TABS
50.0000 mg | ORAL_TABLET | Freq: Two times a day (BID) | ORAL | 0 refills | Status: DC | PRN
  Filled 2023-08-15: qty 10, 5d supply, fill #0

## 2023-08-15 MED ORDER — BUSPIRONE HCL 5 MG PO TABS
5.0000 mg | ORAL_TABLET | Freq: Three times a day (TID) | ORAL | 0 refills | Status: DC
  Filled 2023-08-15: qty 90, 30d supply, fill #0

## 2023-08-15 MED ORDER — SUCRALFATE 1 GM/10ML PO SUSP
1.0000 g | Freq: Two times a day (BID) | ORAL | 0 refills | Status: AC
  Filled 2023-08-15: qty 60, 3d supply, fill #0

## 2023-08-15 MED ORDER — CYANOCOBALAMIN 1000 MCG PO TABS
1000.0000 ug | ORAL_TABLET | Freq: Every day | ORAL | 0 refills | Status: DC
  Filled 2023-08-15: qty 30, 30d supply, fill #0

## 2023-08-15 MED ORDER — PANTOPRAZOLE SODIUM 40 MG PO TBEC
40.0000 mg | DELAYED_RELEASE_TABLET | Freq: Every day | ORAL | 0 refills | Status: DC
  Filled 2023-08-15: qty 30, 30d supply, fill #0

## 2023-08-15 MED ORDER — CYANOCOBALAMIN 1000 MCG/ML IJ SOLN
1000.0000 ug | Freq: Once | INTRAMUSCULAR | Status: AC
  Administered 2023-08-16: 1000 ug via INTRAMUSCULAR
  Filled 2023-08-15 (×2): qty 1

## 2023-08-15 MED ORDER — THIAMINE HCL 100 MG PO TABS
100.0000 mg | ORAL_TABLET | Freq: Every day | ORAL | 0 refills | Status: DC
  Filled 2023-08-15: qty 30, 30d supply, fill #0

## 2023-08-15 MED ORDER — MAGNESIUM OXIDE -MG SUPPLEMENT 400 (240 MG) MG PO TABS
400.0000 mg | ORAL_TABLET | Freq: Two times a day (BID) | ORAL | 0 refills | Status: AC
  Filled 2023-08-15: qty 60, 30d supply, fill #0

## 2023-08-15 MED ORDER — ACETAMINOPHEN 325 MG PO TABS
325.0000 mg | ORAL_TABLET | ORAL | Status: AC | PRN
Start: 2023-08-15 — End: ?

## 2023-08-15 MED ORDER — POTASSIUM CHLORIDE CRYS ER 10 MEQ PO TBCR
30.0000 meq | EXTENDED_RELEASE_TABLET | Freq: Two times a day (BID) | ORAL | 0 refills | Status: AC
Start: 2023-08-15 — End: ?
  Filled 2023-08-15 – 2023-08-16 (×2): qty 120, 20d supply, fill #0

## 2023-08-15 MED ORDER — VITAMIN D (ERGOCALCIFEROL) 1.25 MG (50000 UNIT) PO CAPS
50000.0000 [IU] | ORAL_CAPSULE | ORAL | 0 refills | Status: DC
  Filled 2023-08-15: qty 5, 30d supply, fill #0

## 2023-08-15 MED ORDER — DULOXETINE HCL 30 MG PO CPEP
30.0000 mg | ORAL_CAPSULE | Freq: Two times a day (BID) | ORAL | 0 refills | Status: DC
  Filled 2023-08-15: qty 60, 30d supply, fill #0

## 2023-08-15 MED ORDER — CALCIUM POLYCARBOPHIL 625 MG PO TABS
625.0000 mg | ORAL_TABLET | Freq: Every day | ORAL | 0 refills | Status: DC
  Filled 2023-08-15: qty 30, 30d supply, fill #0

## 2023-08-15 MED ORDER — METOPROLOL TARTRATE 25 MG PO TABS
12.5000 mg | ORAL_TABLET | Freq: Two times a day (BID) | ORAL | 0 refills | Status: DC
  Filled 2023-08-15: qty 30, 30d supply, fill #0

## 2023-08-15 MED ORDER — LIDOCAINE 4 % EX CREA
TOPICAL_CREAM | Freq: Three times a day (TID) | CUTANEOUS | 0 refills | Status: AC
  Filled 2023-08-15: qty 113, fill #0

## 2023-08-15 MED ORDER — CYANOCOBALAMIN 1000 MCG/ML IJ SOLN: 1000.0000 ug | INTRAMUSCULAR | Status: DC

## 2023-08-15 MED ORDER — CYANOCOBALAMIN 1000 MCG/ML IJ SOLN
1000.0000 ug | INTRAMUSCULAR | 0 refills | Status: DC
  Filled 2023-08-15: qty 4, 120d supply, fill #0

## 2023-08-15 MED ORDER — MELATONIN 3 MG PO TABS
3.0000 mg | ORAL_TABLET | Freq: Every evening | ORAL | 0 refills | Status: AC | PRN
  Filled 2023-08-15: qty 30, 30d supply, fill #0

## 2023-08-15 MED ORDER — FOLIC ACID 1 MG PO TABS
1.0000 mg | ORAL_TABLET | Freq: Every day | ORAL | 0 refills | Status: DC
  Filled 2023-08-15: qty 30, 30d supply, fill #0

## 2023-08-15 MED ORDER — GABAPENTIN 300 MG PO CAPS
ORAL_CAPSULE | ORAL | 0 refills | Status: DC
  Filled 2023-08-15: qty 210, 30d supply, fill #0

## 2023-08-15 MED ORDER — BUDESONIDE-FORMOTEROL FUMARATE 160-4.5 MCG/ACT IN AERO
2.0000 | INHALATION_SPRAY | Freq: Two times a day (BID) | RESPIRATORY_TRACT | 0 refills | Status: DC
  Filled 2023-08-15: qty 10.2, 30d supply, fill #0

## 2023-08-15 NOTE — Progress Notes (Signed)
 Occupational Therapy Discharge Summary  Patient Details  Name: Tiffany Velasquez MRN: [id number] Date of Birth: 16-Jun-1995  Date of Discharge from OT service:Aug 15, 2023  Patient has met 9 of 9 long term goals due to improved activity tolerance, improved balance, postural control, ability to compensate for deficits, functional use of  RIGHT lower and LEFT lower extremity, and improved awareness.  Patient to discharge at overall Modified Independent level.  Patient's care partner is independent to provide the necessary physical assistance at discharge.    Reasons goals not met: NA  Recommendation:  Follow-up OT services not recommended at this time.   Equipment: Bariatric drop-arm BSC & TTB  Reasons for discharge: treatment goals met and discharge from hospital  Patient/family agrees with progress made and goals achieved: Yes  OT Discharge Precautions/Restrictions  Precautions Precautions: Fall Recall of Precautions/Restrictions: Intact Precaution/Restrictions Comments: BLE neuropathy Restrictions Weight Bearing Restrictions Per Provider Order: No ADL ADL Eating: Independent Where Assessed-Eating: Edge of bed Grooming: Modified independent Where Assessed-Grooming: Sitting at sink, Standing at sink Upper Body Bathing: Modified independent Where Assessed-Upper Body Bathing: Shower Lower Body Bathing: Modified independent Where Assessed-Lower Body Bathing: Shower Upper Body Dressing: Modified independent (Device) Where Assessed-Upper Body Dressing: Edge of bed Lower Body Dressing: Modified independent Where Assessed-Lower Body Dressing: Edge of bed Toileting: Modified independent Where Assessed-Toileting: Toilet, Bedside Commode Toilet Transfer: Modified independent Statistician Method: Proofreader: Extra wide drop arm bedside commode Tub/Shower Transfer: Close supervison Web designer Method: Ship broker: Secondary school teacher: Close supervision Film/video editor Method: Designer, industrial/product: Other (comment) (DABSC) Vision Baseline Vision/History: 3 Glaucoma Patient Visual Report: Blurring of vision Perception  Perception: Within Functional Limits Praxis Praxis: WFL Cognition Cognition Overall Cognitive Status: Within Functional Limits for tasks assessed Arousal/Alertness: Awake/alert Orientation Level: Person;Place;Situation Memory: Appears intact Safety/Judgment: Appears intact Brief Interview for Mental Status (BIMS) Repetition of Three Words (First Attempt): 3 Temporal Orientation: Year: Correct Temporal Orientation: Month: Accurate within 5 days Temporal Orientation: Day: Correct Recall: "Sock": Yes, no cue required Recall: "Blue": Yes, no cue required Recall: "Bed": Yes, no cue required BIMS Summary Score: 15 Sensation Sensation Light Touch: Impaired by gross assessment Light Touch Impaired Details: Impaired RLE;Impaired LLE Hot/Cold: Not tested Proprioception: Impaired Detail Proprioception Impaired Details: Impaired RLE;Impaired LLE Stereognosis: Not tested Additional Comments: Pt reports feeling intermittent numbness in bilateral LEs due to neuropathy Coordination Gross Motor Movements are Fluid and Coordinated: No Fine Motor Movements are Fluid and Coordinated: Yes Coordination and Movement Description: Decreased smoothness and fluidity however significantly improved from eval Motor  Motor Motor: Other (comment) Motor - Discharge Observations: Generalized weakness significant improvement from eval Mobility  Transfers Sit to Stand: Independent with assistive device Stand to Sit: Independent with assistive device  Trunk/Postural Assessment  Cervical Assessment Cervical Assessment: Within Functional Limits Thoracic Assessment Thoracic Assessment: Exceptions to [facility] (Rounded shoulder) Lumbar Assessment Lumbar Assessment:  Exceptions to [facility] (Posterior pelvic tilt.) Postural Control Postural Control: Deficits on evaluation Righting Reactions: Delayed Protective Responses: Delayed  Balance Balance Balance Assessed: Yes Static Sitting Balance Static Sitting - Balance Support: Feet supported Static Sitting - Level of Assistance: 7: Independent Dynamic Sitting Balance Dynamic Sitting - Balance Support: During functional activity;Feet supported Dynamic Sitting - Level of Assistance: 6: Modified independent (Device/Increase time) Dynamic Sitting - Balance Activities: Lateral lean/weight shifting;Forward lean/weight shifting Static Standing Balance Static Standing - Balance Support: During functional activity;Bilateral upper extremity supported Static Standing - Level of Assistance: 6: Modified  independent (Device/Increase time) Dynamic Standing Balance Dynamic Standing - Balance Support: During functional activity Dynamic Standing - Level of Assistance: 6: Modified independent (Device/Increase time) Dynamic Standing - Balance Activities: Lateral lean/weight shifting;Forward lean/weight shifting;Reaching for objects Extremity/Trunk Assessment RUE Assessment RUE Assessment: Within Functional Limits LUE Assessment LUE Assessment: Within Functional Limits   Artemus Biles, OTR/L, MSOT  08/15/2023, 3:58 PM

## 2023-08-15 NOTE — Progress Notes (Signed)
 Patient ID: SANTIAGO GRAF, female   DOB: [birth year], 28 y.o.   MRN: [id number] Met with the patient, parents and other family to review team conference report. Reviewed recommending d/c 08/16/23; home with parents as she has met her goals but needs CGA for steps. Family education completed 08/15/23 with parents and brother. Reviewed OP PT follow up; [facility] will initiate services upon discharge. DME ordered bari rollator (delivered to room), TTB to be delivered to the home. Discrepancy regarding order for drop arm BSC; [employer] notes patient ordered same item 07/09/23 however patient and mother adamant that they did not receive equipment at previous hospitalization. [employer] was contacted regarding need for equipment and [employer] is researching previous order.  Patient and family acknowledged an understanding are in agreement with the discharge date and follow up plans. Naoma Bacca

## 2023-08-15 NOTE — Progress Notes (Signed)
 Physical Therapy Discharge Summary  Patient Details  Name: Tiffany Velasquez MRN: [id number] Date of Birth: 06-Aug-1995  Date of Discharge from PT service:Aug 15, 2023  Today's Date: 08/15/2023 PT Individual Time: 1914-7829 + [id number] PT Individual Time Calculation (min): 55 min + 27 min   Patient has met 10 of 10 long term goals due to improved activity tolerance, improved balance, improved postural control, increased strength, decreased pain, ability to compensate for deficits, and improved coordination.  Patient to discharge at an ambulatory level Modified Independent.   Patient's care partner is independent to provide the necessary physical assistance at discharge.  Reasons goals not met: N/A  Recommendation:  Patient will benefit from ongoing skilled PT services in outpatient setting to continue to advance safe functional mobility, address ongoing impairments in gait mechanics, dynamic standing balance, global strengthening and endurance, and minimize fall risk.  Equipment: Rollator, BSC, TTB  Reasons for discharge: treatment goals met and discharge from hospital  Patient/family agrees with progress made and goals achieved: Yes  PT Discharge Skilled Treatment Interventions: SESSION 1:  Pt presents in room in bed, motivated to participate with PT. Pt denies pain at this time. Session focused on NMR for HEP to complete at DC as well as therapeutic activities for floor transfer training. Pt completes transfers and ambulation with rollator modI throughout session, pt ambulates from room to main gym with rollator ~150'. Pt completes bed mobility on mat independently completing supine to prone. Pt is able to transition from prone to quadruped with increased time and supervision, transitions to tall kneeling with close supervision. Pt then return to sitting on EOB independently with increased time. Pt then completes transfer to floor with CGA with cues for technique, pt provided with education  on fall recovery and safety post fall with pt verbalizing understanding. Pt then completes transfer from supine on floor, to quadruped, to tall kneeling to half kneeling with BUEs on mat with CGA, requires increased time and mod assist to transition to sitting on EOM with pt crawling onto mat and completing prone to sitting on EOM. Pt then completes one set of the following exercises, completed as NMR for BLE/core muscle fiber recruitment needed for functional transfers including floor transfers, and ambulation:  Access Code: [email or web address] URL: [email or web address] Date: 08/15/2023 Prepared by: Annia Kilts  Exercises - Supine Bridge  - 1 x daily - 7 x weekly - 1 sets - 5 reps - Straight Leg Raise  - 1 x daily - 7 x weekly - 1 sets - 5 reps - Single Leg Bridge  - 1 x daily - 7 x weekly - 1 sets - 5 reps - Supine 90/90 Alternating Toe Touch One Leg at a Time  - 1 x daily - 7 x weekly - 1 sets - 5 reps - Transition from belly onto hands and knees  - 1 x daily - 7 x weekly - 1 sets - 5 reps - Sit to Stand with Armchair  - 1 x daily - 7 x weekly - 1 sets - 5 reps - Standing Marching  - 1 x daily - 7 x weekly - 1 sets - 20 reps  Pt returns to room and remains seated EOB with all needs within reach, cal light in place at end of session.  SESSION 2: Pt presents in room seated EOB with family present for family education. Session focused on education for family members on pt CLOF, fall prevention and fall recovery, and safety at DC with pt family  members verbalizing understanding. Pt completes transfers modI, ambulates on unit up to 300' with rollator modI, completes up/down 4 steps with LHR to simulate home environment with supervision, car transfer with supervision, ambulates up/down ramp with rollator with supervision. Pt ambulates 48' without rollator with close supervision. Pt returns to room and remains seated on EOB with all needs within reach, cal light in place and rollator in place  as pt modI in room at end of session.   Precautions/Restrictions Precautions Precautions: Fall Recall of Precautions/Restrictions: Intact Precaution/Restrictions Comments: BLE neuropathy Restrictions Weight Bearing Restrictions Per Provider Order: No Pain Interference Pain Interference Pain Effect on Sleep: 2. Occasionally Pain Interference with Therapy Activities: 1. Rarely or not at all Pain Interference with Day-to-Day Activities: 1. Rarely or not at all Vision/Perception  Vision - History Ability to See in Adequate Light: 1 Impaired Perception Perception: Within Functional Limits Praxis Praxis: WFL  Cognition Overall Cognitive Status: Within Functional Limits for tasks assessed Arousal/Alertness: Awake/alert Memory: Appears intact Safety/Judgment: Appears intact Sensation Sensation Light Touch: Impaired by gross assessment Light Touch Impaired Details: Impaired RLE;Impaired LLE Hot/Cold: Not tested Proprioception: Impaired Detail Proprioception Impaired Details: Impaired RLE;Impaired LLE Stereognosis: Not tested Additional Comments: Pt reports feeling intermittent numbness in bilateral LEs due to neuropathy Coordination Gross Motor Movements are Fluid and Coordinated: No Fine Motor Movements are Fluid and Coordinated: Yes Coordination and Movement Description: Decreased smoothness and fluidity however significantly improved from eval Motor  Motor Motor: Other (comment) Motor - Discharge Observations: Generalized weakness significant improvement from eval  Mobility Transfers Transfers: Sit to Stand;Stand to Sit Sit to Stand: Independent with assistive device Stand to Sit: Independent with assistive device Squat Pivot Transfers: Independent with assistive device Transfer (Assistive device): Rollator Locomotion  Gait Ambulation: Yes Gait Assistance: Independent with assistive device Gait Distance (Feet): 300 Feet Assistive device: Rollator Gait Gait:  Yes Gait Pattern: Impaired Gait Pattern: Lateral hip instability Stairs / Additional Locomotion Stairs: Yes Stairs Assistance: Supervision/Verbal cueing Stair Management Technique: Sideways;One rail Left Number of Stairs: 4 Height of Stairs: 6 Ramp: Supervision/Verbal cueing Wheelchair Mobility Wheelchair Mobility: No  Trunk/Postural Assessment  Cervical Assessment Cervical Assessment: Within Functional Limits Thoracic Assessment Thoracic Assessment: Exceptions to [facility] (Rounded shoulder) Lumbar Assessment Lumbar Assessment: Exceptions to [facility] (Posterior pelvic tilt.) Postural Control Postural Control: Deficits on evaluation Righting Reactions: Delayed Protective Responses: Delayed  Balance Balance Balance Assessed: Yes Static Sitting Balance Static Sitting - Balance Support: Feet supported Static Sitting - Level of Assistance: 7: Independent Dynamic Sitting Balance Dynamic Sitting - Balance Support: During functional activity;Feet supported Dynamic Sitting - Level of Assistance: 6: Modified independent (Device/Increase time) Dynamic Sitting - Balance Activities: Lateral lean/weight shifting;Forward lean/weight shifting Static Standing Balance Static Standing - Balance Support: During functional activity;Bilateral upper extremity supported Static Standing - Level of Assistance: 6: Modified independent (Device/Increase time) Dynamic Standing Balance Dynamic Standing - Balance Support: During functional activity Dynamic Standing - Level of Assistance: 6: Modified independent (Device/Increase time) Dynamic Standing - Balance Activities: Lateral lean/weight shifting;Forward lean/weight shifting;Reaching for objects Extremity Assessment  RLE Assessment RLE Assessment: Exceptions to [facility] General Strength Comments: tested in sitting RLE Strength Right Hip Flexion: 3+/5 Right Hip Extension: 3+/5 Right Knee Flexion: 4/5 Right Knee Extension: 4/5 Right Ankle Dorsiflexion:  4/5 Right Ankle Plantar Flexion: 3/5 LLE Assessment LLE Assessment: Exceptions to [facility] General Strength Comments: tested in sitting LLE Strength Left Hip Flexion: 3+/5 Left Hip Extension: 3+/5 Left Knee Flexion: 4/5 Left Knee Extension: 4/5 Left Ankle Dorsiflexion: 4/5 Left Ankle Plantar  Flexion: 3/5   Annia Kilts PT, DPT 08/15/2023, 4:33 PM

## 2023-08-15 NOTE — Progress Notes (Signed)
 PROGRESS NOTE   Subjective/Complaints:  Vitals stable.  Does wish to discuss order to resume care of B12 infusions on discharge with [facility].  Otherwise, no questions or concerns regarding discharge.  Pain well-controlled.  Feels she is doing much better.  Is excited to get back to living her life and being able to do activities in the community.  ROS: as per HPI. Denies fever, CP, SOB, abd pain, N/V/D/C, or any other complaints at this time.     Objective:   No results found.   Recent Labs    08/14/23 0525  WBC 4.9  HGB 9.4*  HCT 30.7*  PLT 264     Recent Labs    08/14/23 0525  NA 137  K 3.8  CL 114*  CO2 19*  GLUCOSE 86  BUN 9  CREATININE 0.65  CALCIUM  9.0     Intake/Output Summary (Last 24 hours) at 08/15/2023 1043 Last data filed at 08/15/2023 6962 Gross per 24 hour  Intake 1460 ml  Output --  Net 1460 ml        Physical Exam: Vital Signs Blood pressure 120/67, pulse 76, temperature 98.7 F (37.1 C), temperature source Oral, resp. rate 18, height 5\' 9"  (1.753 m), weight (!) 142.6 kg, last menstrual period 06/27/2023, SpO2 100%.    General: NAD, obese, ambulating with rolling walker to therapy gym. HEENT: Head is normocephalic, atraumatic, membranes moist Neck: Supple without JVD or lymphadenopathy Heart: RRR no m/r/g appreciated Chest: CTA bilaterally without wheezes, rales, or rhonchi; no distress. Port R chest  Abdomen: Soft, non-tender, non-distended, normoactive bowel sounds Extremities: No clubbing, cyanosis, Tr b/l edema but improving Psych: Appropriate, cooperative, pleasant, less anxious Skin: Clean and intact without signs of breakdown over exposed surfaces.  Right chest port C-D-I; accessed  Neuro:  Awake, alert, oriented x 4.  No apparent cognitive deficits. Strength antigravity 4 out of 5 bilateral upper extremities, 4/5 bilateral hip flexors, 4 out of 5  distal lower extremities.--Unchanged 5-20 Hypersensitivity to light touch in bilateral feet/ankles mild and much improved No abnormal tone noted   Assessment/Plan: 1. Functional deficits which require 3+ hours per day of interdisciplinary therapy in a comprehensive inpatient rehab setting. Physiatrist is providing close team supervision and 24 hour management of active medical problems listed below. Physiatrist and rehab team continue to assess barriers to discharge/monitor patient progress toward functional and medical goals  Care Tool:  Bathing    Body parts bathed by patient: Right arm, Left arm, Chest, Abdomen, Front perineal area, Buttocks, Right upper leg, Left upper leg, Right lower leg, Left lower leg, Face   Body parts bathed by helper: Buttocks, Right upper leg, Left upper leg, Right lower leg, Left lower leg     Bathing assist Assist Level: Supervision/Verbal cueing     Upper Body Dressing/Undressing Upper body dressing   What is the patient wearing?: Pull over shirt    Upper body assist Assist Level: Set up assist    Lower Body Dressing/Undressing Lower body dressing    Lower body dressing activity did not occur: Environmental limitations (no clothes available) What is the patient wearing?: Underwear/pull up     Lower body  assist Assist for lower body dressing: Supervision/Verbal cueing     Toileting Toileting Toileting Activity did not occur (Clothing management and hygiene only): N/A (no void or bm)  Toileting assist Assist for toileting: Set up assist     Transfers Chair/bed transfer  Transfers assist     Chair/bed transfer assist level: Supervision/Verbal cueing     Locomotion Ambulation   Ambulation assist   Ambulation activity did not occur: Safety/medical concerns  Assist level: Supervision/Verbal cueing Assistive device: Rollator Max distance: 150'   Walk 10 feet activity   Assist  Walk 10 feet activity did not occur:  Safety/medical concerns  Assist level: Supervision/Verbal cueing Assistive device: Rollator   Walk 50 feet activity   Assist Walk 50 feet with 2 turns activity did not occur: Safety/medical concerns  Assist level: Supervision/Verbal cueing Assistive device: Rollator    Walk 150 feet activity   Assist Walk 150 feet activity did not occur: Safety/medical concerns  Assist level: Supervision/Verbal cueing Assistive device: Rollator    Walk 10 feet on uneven surface  activity   Assist Walk 10 feet on uneven surfaces activity did not occur: Safety/medical concerns         Wheelchair     Assist Is the patient using a wheelchair?: No             Wheelchair 50 feet with 2 turns activity    Assist            Wheelchair 150 feet activity     Assist          Blood pressure 120/67, pulse 76, temperature 98.7 F (37.1 C), temperature source Oral, resp. rate 18, height 5\' 9"  (1.753 m), weight (!) 142.6 kg, last menstrual period 06/27/2023, SpO2 100%.  Medical Problem List and Plan: 1. 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 If unaccessed?             - ELOS/Goals: 2-4 weeks -  Mod I - DC 5/21             - Continue CIR  - 4/29: Having help from mom/dad at home in discharge. Mod-Max A ADLs with bed-level LB. Biggest barriers are weakness and some self-limiting behaviors. Walked 15 feet with EVA walker. Regular walker STS today.  -Expected DC was adjusted to 5/21-continue with current date  - Follow-up in my outpatient clinic after discharge  - Thiamine  changed to oral-discussed with pharmacy   - 5/20: CGA/supervision for stairs, otherwise Mod I PT and OT.   2.  Antithrombotics: -DVT/anticoagulation:  Pharmaceutical: Lovenox  70mg  daily              -antiplatelet therapy: N/A  3. Pain Management: Has been using oxycodone  prn past 3 days? For foot, leg and back pain- also has Gabapentin  800mg  TID (increased 4/25) - duloxetine  stopped initially due to myositis- 30 mg daily resumed 4/23 -07/23/23 feet very sensitive, but cymbalta  and gabapentin  recently adjusted, lidocaine  cream not enough so asked that pharmacy send 15g tubes per treatment, to adequately cover the plantar surfaces of her feet to see if we can get relief.  4-28: Remains very sensitive, does feel some improvement 4-29: Patient with some daytime lethargy, pain primarily at night.  Reduce daytime gabapentin  to 600 mg every morning/600 mg q. afternoon, increase nighttime dose  to 1000 mg.  Increase duloxetine  to 30 mg twice daily. 4-30: Patient did much better overnight with the above changes. 5-1: Discussed with patient further weaning daytime gabapentin ; she feels good on current regimen and wishes to hold off at this time 5/5 add prn robaxin  for muscle spams 5/6 tramadol  PRN started 5/7 patient reports pain controlled today other than mild headache improving with Tylenol .  Continue to monitor 5/8 Continue current pain regimen.  Do not want to increase gabapentin  due to concerns of sedation.  Could consider trying Lyrica 5/9 patient reports pain is doing better when she keeps her legs warm.  Denies any pain currently or last night.  Continue current regimen.  Qutenza could be option outpatient if her distal neuropathy pain worsens again.  Overall pain has been controlled so we will hold off on adjustments. -08/06/23 pain improved with tramadol  overnight -5/12-17 pain controlled with tramadol , Robaxin  and gabapentin  current dose.  Continue to monitor 5-19: Will adjust gabapentin  to 900 mg nightly tonight for ease of prescription as outpatient--tolerated well  4. Mood/Behavior/Sleep: LCSW to follow for evaluation and support.              -antipsychotic agents: N/A  -Neuropsych  consult  -07/23/23 didn't sleep well, has melatonin PRN to use - 4-28: Sleep interrupted by pain as above.  Increasing gabapentin  and duloxetine ; may benefit from transition to Elavil nightly If no improvement 4-30: Sleeping better.  Thinks melatonin as needed is over sedating, advised that this is per her request.   5. Neuropsych/cognition: This patient is capable of making decisions on her own behalf. - 4-28: Dr. Cheryll Corti evaluated today; appreciate his professional assessment  6. Skin/Wound Care: Routine pressure relief measures.   - Chest port appears clean, routine dressing changes  7. Fluids/Electrolytes/Nutrition: Monitor I/O. Continue Ensure supplements and vitamin supplementation -07/22/23 hypoK+ 3.4, KCL 40meq po x 1- recheck 4/28 -07/23/23 added labs for tomorrow (Mg/phos, CMP, CK) 4/28: Hypokalemia worsened to 2.9 status post 40 mill equivalents; add 40 mill equivalents KCl PO, +40 mill equivalents IV today.  Repeat BMP tonight and tomorrow a.m. Phos elevated, so we will avoid K-Phos--repeat potassium 3.4 at 6 PM. 4-29: Hypokalemia resolved.  Repeat labs Thursday. 5/1: Potassium back to 2.9.  DC HCTZ.  Add 40 mill equivalents daily p.o. repletion, another 20 mill equivalents IV today.  Repeat labs in AM. 07/29/23 K 3.1 yesterday, will do 20mEq IV once today and continue the 40mEq PO, repeat labs Monday 5/5 K+ up to 3.3, increase to 30meq BID 5/8 K+ up to 3.5 continue supplment, MG low at 1.6, IV Mg 2 g given after discussion with pharmacy.  Recheck tomorrow -08/05/23 Mg 1.9, K 3.5, monitor -5/12 add mg oxide 400mg  BIDsupplement -5/15 magnesium  stable at 1.7, continue supplements 5-19: Magnesium  1.8  8. Non-specific myositis: Cymbalta  d/c by neurology due to concerns of rhabdomyolysis --Cymbalta  30 mg resumed 04/23 given improving myositis.  --Monitor CK intermittently improved from 1536-->514--> 398 --consider muscle biopsy if symptoms do not improve -07/23/23 CK to be done  tomorrow and weekly on Mon 4-28: CK downtrending, continue to trend 5/1: CK has normalized. Scheduled for qMon checks-- might consider d/c'ing checks? -CK checks discontinued  9. Multifocal PNA: Likely aspiration--most significant in lingula and RLL on CT w/o contrast chest 07/18/23 --Hx of chronic intermittent N/V. May need swallow evaluation. Swallow precautions added.   --started on Augmentin  04/22-->04/25 for 5 day course.  -No apparent signs of respiratory distress  11. Multiple Vitamin deficiencies: Vitamin  B1  250 mg daily 4/22-->4/28 followed by 100 mg daily IV.  --now on B12 IM+PO, IV folate. Vitamin A  10,000 units daily. Vitamin D  50,000/wk, and MVI --recheck Mg, Phos (supplemented)-- ordered for Monday 4/28--Phos improving, magnesium  stable -5/8 MG low, discussed pharm- IV 2g today recheck Saturday-- see above #7 -5/9-5/11 add mg oxide 400mg  BID supplement MG stable 5-20: Continue magnesium  supplementation on discharge.  Will need B[facility] infusions [facility]  12. Resting tachycardia: HR has been 110-120 range at baseline --not on any BB -Start metoprolol  12.5 mg BID -07/23/23 still tachycardic but less, monitor  - intermittent, asymptomatic. Monitor -5/12-18 HR stable, continue to monitor       08/15/2023    4:38 AM 08/14/2023    9:34 PM 08/14/2023    9:03 PM  Vitals with BMI  Systolic 120 105 454  Diastolic 67 64 82  Pulse 76 79 85     13. Accelerated HTN/orthostatic hypotension: BP poorly controlled. Continue hydrochlorothiazide  12.5mg  daily and Avapro  150mg  daily -- addition of BB? -4/25 BB started as in #12 -07/23/23 BPs better, monitor; of note, pharmacy note states pt was on amlodipine  previously? Remain off for now, but note this for d/c 4/28 : reduce HCTZ to 6.125 due to recurrent hypokalemia--BP stable 5/1: BUN elevated, blood pressure soft, hypokalemia as above; DC hydrochlorothiazide .  Got 500 cc IV fluid bolus this  afternoon. 5-2: Remains low, irbesartan  reduced to 75 mg this AM; DC given ongoing symptomatic orthostasis, can consider resumption once BP comes up.  Will give 1 L IV fluids today and patient thinks she can tolerate TED hose now, so we will add this on.  Will get orthostatics after IV fluid. -5/3-4/25 BPs improving; monitor -5/6-8 BP controlled, continue current regimen -5/9 patient felt to have high diastolic BP.  Think this is related to automatic cuff in the wrist, nursing advised to check upper arm-improved and stable - No symptoms, normotensive.  Continue current regimen. 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 Latanoprost  and alphagan  gtts             --IVIG 04/21- 04/24. Followed by Dr. Curley Double. -07/23/23 humira, retinA, and rhopressa  listed in pharmacy note as "resume as needed in CIR or at discharge"; clarify when/if these need to be restarted while here 4-29: Should be OK to resume Retin A and Rhopressa ; will inquire about bringing from home. -- will check with Optho about Cellcept and Humira resumption.  4-30: Family brought Rhopressa  and Humira in from home; pharmacy holding Humira until clarified by Optho.  Rhopressa  resumed. 5-1: Spoke with Dr. Mason Sole, continue to hold Humira and CellCept pending outpatient follow-up with him shortly after discharge.  15. Urinary retention: Purewick being used--monitor voiding with PVR/bladder scan. Toilet every 4 hours  -4/25 PVR 64, continue to monitor   - 4-28: No recent PVRs documented, patient denies incontinence, monitor  16. Chronic Asthma: Continue Dulera  BID and  singulair  10mg  nightly, azelastin BID --Followed by Dr. Idolina Maker.   17. H/o chronic gastritis w/nausea and vomiting: D/c Ibuprofen . Continue Protonix  40 mg daily and Pepcid   40mg  nightly; was on reglan  at home, has PRN compazine  here. Carafate  1g BID.  -- Followed by [facility].  No complaints while at rehab  18. Abnormal LFTs: Normal liver parenchyma and GB sludge noted on abdominal ultrasound 07/16/23.  --Resolving. Recheck in am-pending - 07/23/23 LFTs a bit improved 4/26, repeat CMP tomorrow morning; of note, HepB SAb reactive, core Ab positive 4-28: LFTs looking better 4/12 improved  19. Peripheral polyneuropathy: Cymbalta  resumed 04/23 as CK trending down. Will check weekly -- See #3 above; may benefit from transition to Lyrica or trial of Qutenza as outpatient.  Was establishing with Dr. Rayleen Cal  18. Anemia of chronic disease: Likely malabsorption w/ Low iron but elevated TIBC/Ferritin -07/23/23 Hgb 9.9 on Fri, repeat weekly on Mon/Thurs; iron studies done 4/21 showing low TIBC 172, iron 46, ferritin elevated 791.  -5/8 HGB overall stable 8.5 -5/15 stable at 9.0  19. Morbid obesity: BMI 48. Followed by RD/Bariatric nutrition for wt loss. Plan for Bariatric surgery 08/2023.              --Now on aggressive vitamin supplementation as above  20. Hyponatremia: Na down to 132-->question due to IVIG 4/31-4/24.  --May see rise in LFTs again.  -4/25 recheck labs still pending, will ask nursing to call to check on this -07/23/23 Na 131 on 4/26, recheck tomorrow to see trend or need for supplementation  4/28: NA stable 132. 5/1: Na 134, stable>> 3.1 5/2; monitor  5/12 stable at 139 5/15 stable  137  21. Severe tricuspid regurgitation/grade 1 diastolic heart failure   - 4/29: add daily weights with diuretic adjustments--stable -5/2: Weights remain stable, no external edema with DC hydrochlorothiazide .  Continue to monitor closely. -07/30/23 wt down significantly today, wonder if it's  inaccurate; monitor trend -5/16 weights overall stable continue to monitor 5-19: Current renal weights unreliable. Filed Weights   08/09/23 0500 08/10/23 1500 08/14/23 0636  Weight: 65.2 kg (!) 144 kg (!) 142.6 kg    22. Gout: continue allopurinol  100mg  QD; off colchicine  for now, resume as appropriate   23. Diarrhea/constipation: -07/23/23 pt now having runny stools, thinks it's the food; stop miralax  daily and change to PRN; monitor 4-28: Ongoing liquid stool; encourage p.o. fluids, replete electrolytes as above, and adding fibercon supplemen\t   - 4/29: No Bms today; slowing down. [place] - 4-30: Diarrhea followed by smears, KUB performed to ensure no overflow incontinence, was normal. 5/1: KUB normal, patient feels she is having adequate bowel movements.   Last bowel movement 5-2, adequate per patient  LBM 5/7, consider additional medication if no BM by tomorrow -08/05/23 LBM 2 days ago per pt, 3 days ago per documentation; will see if she has BM today but if not then will start meds. Encouraged to use miralax  today  - Last bowel movement 5-20 24. Dysuria:  -07/29/23 pt stating some dysuria/difficulty with urination; ordered U/A -07/30/23 U/A not done yesterday d/t BMs everytime she urinated; will try urinal today; f/up on results  -5/5/5 U/A 5/3 with 21-50 WBC, many bacteria start kelfex for suspected UTI, culture added 08/04/23 symptoms have improved, Proteus on urine culture, change to Augmentin  and DC keflex  -08/05/23 got 3.5 days of keflex , so augmentin  through 5/14  Symptoms have resolved  25. Anxiety  -xanax  0.25mg  PRN  -5/7 BuSpar  3 times daily 5 mg 5/14 anxiety appears to be doing better, continue current regimen, infrequent use of Xanax --would discontinue at discharge    LOS: 26 days A FACE TO FACE EVALUATION WAS  PERFORMED  Bea Lime 08/15/2023, 10:43 AM

## 2023-08-15 NOTE — Progress Notes (Signed)
 Inpatient Rehabilitation Discharge Medication Review by a Pharmacist  A complete drug regimen review was completed for this patient to identify any potential clinically significant medication issues.  High Risk Drug Classes Is patient taking? Indication by Medication  Antipsychotic No   Anticoagulant No   Antibiotic No   Opioid Yes  Tramadol - acute pain   Antiplatelet No   Hypoglycemics/insulin No   Vasoactive Medication Yes lopressor  - HTN  Chemotherapy No   Other Yes Diamox  - edema Allopurinol  - gout Astelin ,dulera , albuterol , flonase , singulair , Rhopressa  - allergy  Alphagan , lumigan, cosopt  - glaucoma B12/folic acid /MVI/vit D/VitA/thiamine , MagOx, KCl - supplementation Famotidine /protonix /carafate  - reflux Neurontin  - neuropathy Cymbalta ,buspirone  - MDD/anxiety Melatonin - sleep APAP- pain  Humira- autoimmune disease      Type of Medication Issue Identified Description of Issue Recommendation(s)  Drug Interaction(s) (clinically significant)     Duplicate Therapy     Allergy      No Medication Administration End Date     Incorrect Dose     Additional Drug Therapy Needed     Significant med changes from prior encounter (inform family/care partners about these prior to discharge).    Other  PTA meds not resumed- colchicine , reglan , amlodipine -olmesartan -hydrochlorothiazide )   Resume as needed in CIR or at discharge    Clinically significant medication issues were identified that warrant physician communication and completion of prescribed/recommended actions by midnight of the next day:  No  Name of provider notified for urgent issues identified:   Provider Method of Notification:     Pharmacist comments:   Time spent performing this drug regimen review (minutes):  20   Chrystie Crass, PharmD Clinical Pharmacist  08/15/2023 1:55 PM

## 2023-08-15 NOTE — Progress Notes (Signed)
 Inpatient Rehabilitation Care Coordinator Discharge Note   Patient Details  Name: Tiffany Velasquez MRN: [id number] Date of Birth: [birth year]   Discharge location: HOME WITH PARENTS WHO CAN PROVIDE SUPERVISION  Length of Stay: 27 DAYS  Discharge activity level: SUPERVISION-CGA LEVEL  Home/community participation: ACTIVE  Patient response NW:GNFAOZ Literacy - How often do you need to have someone help you when you read instructions, pamphlets, or other written material from your doctor or pharmacy?: Never  Patient response HY:QMVHQI Isolation - How often do you feel lonely or isolated from those around you?: Never  Services provided included: MD, RD, PT, OT, RN, CM, TR, Pharmacy, Neuropsych, SW  Financial Services:  Field seismologist Utilized: Scientific laboratory technician MEDICARE  Choices offered to/list presented to: PT AND MOM  Follow-up services arranged:  Outpatient, DME, Patient/Family has no preference for HH/DME agencies    Outpatient Servicies: [facility] [address] [facility] WILL CALL TO SET UP FOLLOW UP APPOINTMENT DME : [facility] RO[facility], DROP-ARM BEDSIDE COMMODE AND TUB BENCH    Patient response to transportation need: Is the patient able to respond to transportation needs?: Yes In the past 12 months, has lack of transportation kept you from medical appointments or from getting medications?: No In the past 12 months, has lack of transportation kept you from meetings, work, or from getting things needed for daily living?: No   Patient/Family verbalized understanding of follow-up arrangements:  Yes  Individual responsible for coordination of the follow-up plan: SELF AND CYNTHIA-MOM [phone]  Confirmed correct DME delivered: Mardell Shade 08/15/2023    Comments (or additional information):PARENTS WERE HERE FOR EDUCATION AND BROTHER'S ARE HERE OFTEN ALSO. FEEL COMFORTABLE WITH GOING HOME  Summary of Stay    Date/Time Discharge Planning CSW   08/14/23 0827 Patient to d/c home w parents ; mother is primary and support from various family. [facility] will need resumption orders. Brother to assist with primary care. Family education 08/15/23 1p-2:30p. DME: bari rollator, drop arm BS and TTB ordered. OP follow up with [facility]  08/08/23 0956 Pt will d/c to home with her parents. Support from various family members. [facility] will need resumption orders. SW will confirm there are no barriers to discharge. AAC  08/01/23 1030 Pt will d/c to home with her parents. Support from various family members. [facility] will need resumption orders. SW will confirm there are no barriers to discharge. AAC  07/25/23 1010 Pt will d/c to home with her parents. Support from various family members. SW will confirm there are no barriers to discharge. AAC       Khiana Camino G

## 2023-08-15 NOTE — Patient Care Conference (Signed)
 Inpatient RehabilitationTeam Conference and Plan of Care Update Date: 08/15/2023   Time: 1052 am    Patient Name: Tiffany Velasquez      Medical Record Number: [id number]  Date of Birth: [birth year] Sex: Female         Room/Bed: 4W14C/4W14C-01 Payor Info: Payor: [employer] MEDICARE / Plan: [employer] MEDICARE HMO / Product Type: *No Product type* /    Admit Date/Time:  07/20/2023 10:06 PM  Primary Diagnosis:  Myositis associated antibody positive  Hospital Problems: Principal Problem:   Myositis associated antibody positive Active Problems:   Myositis   Adjustment disorder with mixed anxiety and depressed mood    Expected Discharge Date: Expected Discharge Date: 08/16/23  Team Members Present: Physician leading conference: Dr. Cherri Corns Social Worker Present: Other (comment) Forrestine Ike RN) Nurse Present: Jerene Monks, RN PT Present: Crist Dominion, PT OT Present: Artemus Biles, OT SLP Present: Tally Faes, SLP PPS Coordinator present : Jestine Moron, SLP     Current Status/Progress Goal Weekly Team Focus  Bowel/Bladder   Patient is continent. LBM 5/18   Patient to maintain continence throughout remainder of stay.   Monitor outputs and changes in bowel and bladder function.    Swallow/Nutrition/ Hydration               ADL's   Setup/supervision for full-body care progressing to Mod I with use of rollator: Barriers: Decreased activity tolerance and generalized weakness.   Upgraded to Mod I   Discharge planning and caregiver education    Mobility   modI bed mobility, modI transfers and short distance ambulation with rollator, CGA 4 steps with LHR   upgraded to modI  no barriers to DC    Communication                Safety/Cognition/ Behavioral Observations               Pain   Pt denies any pain at this time.   Pain less than or equal to 2.   Assess pain q shift and PRN.    Skin   Skin intact.   No skin breakdown.  Monitor skin q shift and  PRN.      Discharge Planning:  Patient to d/c home w parents ; mother is primary and support from various family. [facility] will need resumption orders. Brother to assist with primary care. Family education 08/15/23 1p-2:30p. DME: bari rollator, drop arm BS and TTB ordered. 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 Patient has pain, tachycardia: medication adjusted by MD. Patient limited by  generalized weakness, decreased activity tolerance, fear of falling and anxiety   Patient on target to meet rehab goals: Yes, Patient requires supervision for ADLs using a rollator. Patient requires Mod I with transfers and mobility using a rollator. Overall goals at  discharge are set for Mod I assistance.  *See Care Plan and progress notes for long and short-term goals.   Revisions to Treatment Plan:  N/a   Teaching Needs: Safety, medications, toileting, transfers, dietary modifications,etc     Current Barriers to Discharge: Decreased caregiver support and Weight  Possible Resolutions to Barriers: Family Education Outpatient follow-up DME: bari rollator/drop arm BSC,/TTB      Medical Summary Current Status: medically complicate by myositis, neuropathy, anemia, anxiety, elecytolyte repletion  Barriers to Discharge: Behavior/Mood;Electrolyte abnormality;Morbid Obesity;Medical stability;Self-care education;Hypotension;Uncontrolled Pain   Possible  Resolutions to [employer]: Monitor BMP, Mg oxide/monitor MG, Continue tramadol /gabapentin , monitor BP and adjust medications as needed   Continued Need for Acute Rehabilitation Level of Care: The patient requires daily medical management by a physician with specialized training in physical medicine and rehabilitation for the following reasons: Direction of a multidisciplinary physical rehabilitation program to maximize  functional independence : Yes Medical management of patient stability for increased activity during participation in an intensive rehabilitation regime.: Yes Analysis of laboratory values and/or radiology reports with any subsequent need for medication adjustment and/or medical intervention. : Yes   I attest that I was present, lead the team conference, and concur with the assessment and plan of the team.   Valicia Rief Gayo 08/15/2023, 1052 am

## 2023-08-15 NOTE — Plan of Care (Signed)

## 2023-08-15 NOTE — Plan of Care (Signed)
  Problem: RH Balance Goal: LTG Patient will maintain dynamic sitting balance (PT) Description: LTG:  Patient will maintain dynamic sitting balance with assistance during mobility activities (PT) Outcome: Completed/Met Goal: LTG Patient will maintain dynamic standing balance (PT) Description: LTG:  Patient will maintain dynamic standing balance with assistance during mobility activities (PT) Outcome: Completed/Met   Problem: Sit to Stand Goal: LTG:  Patient will perform sit to stand with assistance level (PT) Description: LTG:  Patient will perform sit to stand with assistance level (PT) Outcome: Completed/Met   Problem: RH Bed Mobility Goal: LTG Patient will perform bed mobility with assist (PT) Description: LTG: Patient will perform bed mobility with assistance, with/without cues (PT). Outcome: Completed/Met   Problem: RH Bed to Chair Transfers Goal: LTG Patient will perform bed/chair transfers w/assist (PT) Description: LTG: Patient will perform bed to chair transfers with assistance (PT). Outcome: Completed/Met   Problem: RH Car Transfers Goal: LTG Patient will perform car transfers with assist (PT) Description: LTG: Patient will perform car transfers with assistance (PT). Outcome: Completed/Met   Problem: RH Furniture Transfers Goal: LTG Patient will perform furniture transfers w/assist (OT/PT) Description: LTG: Patient will perform furniture transfers  with assistance (OT/PT). Outcome: Completed/Met   Problem: RH Ambulation Goal: LTG Patient will ambulate in controlled environment (PT) Description: LTG: Patient will ambulate in a controlled environment, # of feet with assistance (PT). Outcome: Completed/Met Goal: LTG Patient will ambulate in home environment (PT) Description: LTG: Patient will ambulate in home environment, # of feet with assistance (PT). Outcome: Completed/Met   Problem: RH Stairs Goal: LTG Patient will ambulate up and down stairs w/assist  (PT) Description: LTG: Patient will ambulate up and down # of stairs with assistance (PT) Outcome: Completed/Met   

## 2023-08-16 ENCOUNTER — Other Ambulatory Visit (HOSPITAL_COMMUNITY): Payer: Self-pay

## 2023-08-16 MED ORDER — CYANOCOBALAMIN 1000 MCG/ML IJ SOLN: 1000.0000 ug | INTRAMUSCULAR | 0 refills | Status: DC

## 2023-08-16 MED ORDER — HEPARIN SOD (PORK) LOCK FLUSH 100 UNIT/ML IV SOLN
500.0000 [IU] | INTRAVENOUS | Status: AC | PRN
  Administered 2023-08-16: 500 [IU]

## 2023-08-16 NOTE — Progress Notes (Signed)
 Inpatient Rehabilitation Care Coordinator Discharge Note   Patient Details  Name: Tiffany Velasquez MRN: [id number] Date of Birth: 09-22-1995   Discharge location: HOME WITH PARENTS WHO CAN PROVIDE SUPERVISION  Length of Stay: 27 DAYS  Discharge activity level: SUPERVISION-CGA LEVEL  Home/community participation: ACTIVE  Patient response YQ:MVHQIO Literacy - How often do you need to have someone help you when you read instructions, pamphlets, or other written material from your doctor or pharmacy?: Never  Patient response NG:EXBMWU Isolation - How often do you feel lonely or isolated from those around you?: Never  Services provided included: MD, RD, PT, OT, RN, CM, TR, Pharmacy, Neuropsych, SW  Financial Services:  Field seismologist Utilized: Scientific laboratory technician MEDICARE  Choices offered to/list presented to: PT AND MOM  Follow-up services arranged:  Outpatient, DME, Patient/Family has no preference for HH/DME agencies    Outpatient Servicies: [facility] [address] [facility] WILL CALL TO SET UP FOLLOW UP APPOINTMENT. Given information on [facility] with directions and contact information. DME : [employer] [facility] ROLLATOR, DROP-ARM BEDSIDE COMMODE AND TUB BENCH    Patient response to transportation need: Is the patient able to respond to transportation needs?: Yes In the past 12 months, has lack of transportation kept you from medical appointments or from getting medications?: No In the past 12 months, has lack of transportation kept you from meetings, work, or from getting things needed for daily living?: No   Patient/Family verbalized understanding of follow-up arrangements:  Yes  Individual responsible for coordination of the follow-up plan: SELF AND CYNTHIA-MOM [phone]  Confirmed correct DME delivered: Naoma Bacca 08/16/2023  Rollator delivered to the patient's room. TTB and BSC to be delivered to the home by [employer].  Comments (or additional  information):Family Education completed 08/15/23  Summary of Stay    Date/Time Discharge Planning CSW  08/14/23 0827 Patient to d/c home w parents ; mother is primary and support from various family. [facility] will need resumption orders. Brother to assist with primary care. Family education 08/15/23 1p-2:30p. DME: bari rollator, drop arm BS and TTB ordered. OP follow up with [facility] DBS  08/08/23 0956 Pt will d/c to home with her parents. Support from various family members. [facility] will need resumption orders. SW will confirm there are no barriers to discharge. AAC  08/01/23 1030 Pt will d/c to home with her parents. Support from various family members. [facility] will need resumption orders. SW will confirm there are no barriers to discharge. AAC  07/25/23 1010 Pt will d/c to home with her parents. Support from various family members. SW will confirm there are no barriers to discharge. AAC       Naoma Bacca

## 2023-08-16 NOTE — Progress Notes (Signed)
 PROGRESS NOTE   Subjective/Complaints:  Vitals stable.  No acute complaints.  No events overnight.  Patient feeling good and prepared for discharge.  Excited to get started in outpatient therapies.  ROS: as per HPI. Denies fever, CP, SOB, abd pain, N/V/D/C, or any other complaints at this time.     Objective:   No results found.   Recent Labs    08/14/23 0525  WBC 4.9  HGB 9.4*  HCT 30.7*  PLT 264     Recent Labs    08/14/23 0525  NA 137  K 3.8  CL 114*  CO2 19*  GLUCOSE 86  BUN 9  CREATININE 0.65  CALCIUM  9.0     Intake/Output Summary (Last 24 hours) at 08/16/2023 2343 Last data filed at 08/16/2023 0831 Gross per 24 hour  Intake 236 ml  Output --  Net 236 ml        Physical Exam: Vital Signs Blood pressure 117/81, pulse 96, temperature 98.4 F (36.9 C), temperature source Oral, resp. rate 18, height 5\' 9"  (1.753 m), weight (!) 142.6 kg, last menstrual period 06/27/2023, SpO2 100%.    General: NAD, obese, sitting upright at bedside. HEENT: Head is normocephalic, atraumatic, membranes moist Neck: Supple without JVD or lymphadenopathy Heart: RRR no m/r/g appreciated Chest: CTA bilaterally without wheezes, rales, or rhonchi; no distress. Port R chest  Abdomen: Soft, non-tender, non-distended, normoactive bowel sounds Extremities: No clubbing, cyanosis, Tr b/l edema but improving Psych: Appropriate, cooperative, pleasant, less anxious Skin: Clean and intact   Right chest port C-D-I; accessed  Neuro:  Awake, alert, oriented x 4.  No apparent cognitive deficits. Strength antigravity 4 out of 5 bilateral upper extremities, 4/5 bilateral hip flexors, 4 out of 5 distal lower extremities.--Unchanged 5-20 No further hypersensitivity with light touch of the feet or ankles No abnormal tone noted   Assessment/Plan: 1. Functional deficits which require 3+ hours per day of interdisciplinary therapy in a  comprehensive inpatient rehab setting. Physiatrist is providing close team supervision and 24 hour management of active medical problems listed below. Physiatrist and rehab team continue to assess barriers to discharge/monitor patient progress toward functional and medical goals  Care Tool:  Bathing    Body parts bathed by patient: Right arm, Left arm, Chest, Abdomen, Front perineal area, Buttocks, Right upper leg, Left upper leg, Right lower leg, Left lower leg, Face   Body parts bathed by helper: Buttocks, Right upper leg, Left upper leg, Right lower leg, Left lower leg     Bathing assist Assist Level: Independent with assistive device     Upper Body Dressing/Undressing Upper body dressing   What is the patient wearing?: Pull over shirt    Upper body assist Assist Level: Independent with assistive device    Lower Body Dressing/Undressing Lower body dressing    Lower body dressing activity did not occur: Environmental limitations (no clothes available) What is the patient wearing?: Underwear/pull up, Pants     Lower body assist Assist for lower body dressing: Independent with assitive device     Toileting Toileting Toileting Activity did not occur (Clothing management and hygiene only): N/A (no void or bm)  Toileting assist Assist for  toileting: Independent with assistive device     Transfers Chair/bed transfer  Transfers assist     Chair/bed transfer assist level: Independent with assistive device Chair/bed transfer assistive device: Geologist, engineering   Ambulation assist   Ambulation activity did not occur: Safety/medical concerns  Assist level: Independent with assistive device Assistive device: Rollator Max distance: 300'   Walk 10 feet activity   Assist  Walk 10 feet activity did not occur: Safety/medical concerns  Assist level: Independent with assistive device Assistive device: Rollator   Walk 50 feet activity   Assist Walk 50  feet with 2 turns activity did not occur: Safety/medical concerns  Assist level: Independent with assistive device Assistive device: Rollator    Walk 150 feet activity   Assist Walk 150 feet activity did not occur: Safety/medical concerns  Assist level: Independent with assistive device Assistive device: Rollator    Walk 10 feet on uneven surface  activity   Assist Walk 10 feet on uneven surfaces activity did not occur: Safety/medical concerns   Assist level: Supervision/Verbal cueing Assistive device: Rollator   Wheelchair     Assist Is the patient using a wheelchair?: No (ambulates on unit)             Wheelchair 50 feet with 2 turns activity    Assist            Wheelchair 150 feet activity     Assist          Blood pressure 117/81, pulse 96, temperature 98.4 F (36.9 C), temperature source Oral, resp. rate 18, height 5\' 9"  (1.753 m), weight (!) 142.6 kg, last menstrual period 06/27/2023, SpO2 100%.  Medical Problem List and Plan: 1. 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 If unaccessed?             - ELOS/Goals: 2-4 weeks -  Mod I - DC 5/21             - Continue CIR  - 4/29: Having help from mom/dad at home in discharge. Mod-Max A ADLs with bed-level LB. Biggest barriers are weakness and some self-limiting behaviors. Walked 15 feet with EVA walker. Regular walker STS today.  -Expected DC was adjusted to 5/21-continue with current date  - Follow-up in my outpatient clinic after discharge  - Thiamine  changed to oral-discussed with pharmacy   - 5/20: CGA/supervision for stairs, otherwise Mod I PT and OT.  The patient is medically ready for discharge to home and will need follow-up with [facility]. In addition, they will need to follow up with  their PCP, Neurology.   2.  Antithrombotics: -DVT/anticoagulation:  Pharmaceutical: Lovenox  70mg  daily             -antiplatelet therapy: N/A  3. Pain Management: Has been using oxycodone  prn past 3 days? For foot, leg and back pain- also has Gabapentin  800mg  TID (increased 4/25) - duloxetine  stopped initially due to myositis- 30 mg daily resumed 4/23 -07/23/23 feet very sensitive, but cymbalta  and gabapentin  recently adjusted, lidocaine  cream not enough so asked that pharmacy send 15g tubes per treatment, to adequately cover the plantar surfaces of her feet to see if we can get relief.  4-28: Remains very sensitive, does feel some improvement 4-29: Patient with some daytime lethargy, pain primarily at night.  Reduce daytime gabapentin  to 600 mg every morning/600 mg q. afternoon, increase nighttime dose to 1000 mg.  Increase duloxetine  to 30 mg twice daily. 4-30: Patient did much better overnight with the above changes. 5-1: Discussed with patient further weaning daytime gabapentin ; she feels good on current regimen and wishes to hold off at this time 5/5 add prn robaxin  for muscle spams 5/6 tramadol  PRN started 5/7 patient reports pain controlled today other than mild headache improving with Tylenol .  Continue to monitor 5/8 Continue current pain regimen.  Do not want to increase gabapentin  due to concerns of sedation.  Could consider trying Lyrica 5/9 patient reports pain is doing better when she keeps her legs warm.  Denies any pain currently or last night.  Continue current regimen.  Qutenza could be option outpatient if her distal neuropathy pain worsens again.  Overall pain has been controlled so we will hold off on adjustments. -08/06/23 pain improved with tramadol  overnight -5/12-17 pain controlled with tramadol , Robaxin  and gabapentin  current dose.  Continue to monitor 5-19: Will adjust gabapentin  to 900 mg nightly tonight for ease of prescription as outpatient--tolerated well  4.  Mood/Behavior/Sleep: LCSW to follow for evaluation and support.              -antipsychotic agents: N/A  -Neuropsych consult  -07/23/23 didn't sleep well, has melatonin PRN to use - 4-28: Sleep interrupted by pain as above.  Increasing gabapentin  and duloxetine ; may benefit from transition to Elavil nightly If no improvement 4-30: Sleeping better.  Thinks melatonin as needed is over sedating, advised that this is per her request.   5. Neuropsych/cognition: This patient is capable of making decisions on her own behalf. - 4-28: Dr. Cheryll Corti evaluated today; appreciate his professional assessment  6. Skin/Wound Care: Routine pressure relief measures.   - Chest port appears clean, routine dressing changes  7. Fluids/Electrolytes/Nutrition: Monitor I/O. Continue Ensure supplements and vitamin supplementation -07/22/23 hypoK+ 3.4, KCL 40meq po x 1- recheck 4/28 -07/23/23 added labs for tomorrow (Mg/phos, CMP, CK) 4/28: Hypokalemia worsened to 2.9 status post 40 mill equivalents; add 40 mill equivalents KCl PO, +40 mill equivalents IV today.  Repeat BMP tonight and tomorrow a.m. Phos elevated, so we will avoid K-Phos--repeat potassium 3.4 at 6 PM. 4-29: Hypokalemia resolved.  Repeat labs Thursday. 5/1: Potassium back to 2.9.  DC HCTZ.  Add 40 mill equivalents daily p.o. repletion, another 20 mill equivalents IV today.  Repeat labs in AM. 07/29/23 K 3.1 yesterday, will do 20mEq IV once today and continue the 40mEq PO, repeat labs Monday 5/5 K+ up to 3.3, increase to 30meq BID 5/8 K+ up to 3.5 continue supplment, MG low at 1.6, IV Mg 2 g given after discussion with pharmacy.  Recheck tomorrow -08/05/23 Mg 1.9, K 3.5, monitor -5/12 add mg oxide 400mg  BIDsupplement -5/15 magnesium  stable at 1.7, continue supplements 5-19: Magnesium  1.8  8. Non-specific myositis: Cymbalta  d/c by neurology due to concerns of rhabdomyolysis --Cymbalta  30 mg resumed 04/23 given improving myositis.  --Monitor CK  intermittently improved from 1536-->514--> 398 --consider muscle biopsy if symptoms do not improve -07/23/23 CK to be done tomorrow and weekly on Mon 4-28: CK downtrending, continue to trend 5/1: CK has normalized. Scheduled for qMon checks-- might consider d/c'ing checks? -CK checks discontinued  9. Multifocal PNA: Likely aspiration--most significant in lingula and RLL on CT w/o contrast chest 07/18/23 --Hx of chronic intermittent N/V. May need swallow evaluation. Swallow precautions added.   --started on Augmentin  04/22-->04/25 for 5 day  course.  -No apparent signs of respiratory distress  11. Multiple Vitamin deficiencies: Vitamin B1  250 mg daily 4/22-->4/28 followed by 100 mg daily IV.  --now on B12 IM+PO, IV folate. Vitamin A  10,000 units daily. Vitamin D  50,000/wk, and MVI --recheck Mg, Phos (supplemented)-- ordered for Monday 4/28--Phos improving, magnesium  stable -5/8 MG low, discussed pharm- IV 2g today recheck Saturday-- see above #7 -5/9-5/11 add mg oxide 400mg  BID supplement MG stable 5-20: Continue magnesium  supplementation on discharge.  Will need B12 infusions resumed with [facility]  12. Resting tachycardia: HR has been 110-120 range at baseline --not on any BB -Start metoprolol  12.5 mg BID -07/23/23 still tachycardic but less, monitor  - intermittent, asymptomatic. Monitor -5/12-18 HR stable, continue to monitor       08/16/2023    7:10 AM 08/15/2023    8:12 PM 08/15/2023    5:28 PM  Vitals with BMI  Systolic 117 120 161  Diastolic 81 99 72  Pulse 96 89 91     13. Accelerated HTN/orthostatic hypotension: BP poorly controlled. Continue hydrochlorothiazide  12.5mg  daily and Avapro  150mg  daily -- addition of BB? -4/25 BB started as in #12 -07/23/23 BPs better, monitor; of note, pharmacy note states pt was on amlodipine  previously? Remain off for now, but note this for d/c 4/28 : reduce HCTZ to 6.125 due to recurrent hypokalemia--BP stable 5/1: BUN  elevated, blood pressure soft, hypokalemia as above; DC hydrochlorothiazide .  Got 500 cc IV fluid bolus this afternoon. 5-2: Remains low, irbesartan  reduced to 75 mg this AM; DC given ongoing symptomatic orthostasis, can consider resumption once BP comes up.  Will give 1 L IV fluids today and patient thinks she can tolerate TED hose now, so we will add this on.  Will get orthostatics after IV fluid. -5/3-4/25 BPs improving; monitor -5/6-8 BP controlled, continue current regimen -5/9 patient felt to have high diastolic BP.  Think this is related to automatic cuff in the wrist, nursing advised to check upper arm-improved and stable - No symptoms, normotensive.  Continue current regimen. 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 Latanoprost  and alphagan  gtts             --IVIG 04/21- 04/24. Followed by Dr. Curley Double. -07/23/23 humira, retinA, and rhopressa  listed in pharmacy note as "resume as needed in CIR or at discharge"; clarify when/if these need to be restarted while here 4-29: Should be OK to resume Retin A and Rhopressa ; will inquire about bringing from home. -- will check with Optho about Cellcept and Humira resumption.  4-30: Family brought Rhopressa  and Humira in from home; pharmacy holding Humira until clarified by Optho.  Rhopressa  resumed. 5-1: Spoke with Dr. Mason Sole, continue to hold Humira and CellCept pending outpatient follow-up with him shortly after discharge.  15. Urinary retention: Purewick being used--monitor voiding with PVR/bladder scan. Toilet every 4 hours  -4/25 PVR 64, continue to monitor   - 4-28: No recent  PVRs documented, patient denies incontinence, monitor  16. Chronic Asthma: Continue Dulera  BID and singulair  10mg  nightly, azelastin BID --Followed by Dr. Idolina Maker.   17. H/o  chronic gastritis w/nausea and vomiting: D/c Ibuprofen . Continue Protonix  40 mg daily and Pepcid  40mg  nightly; was on reglan  at home, has PRN compazine  here. Carafate  1g BID.  -- Followed by [facility].  No complaints while at rehab  18. Abnormal LFTs: Normal liver parenchyma and GB sludge noted on abdominal ultrasound 07/16/23.  --Resolving. Recheck in am-pending - 07/23/23 LFTs a bit improved 4/26, repeat CMP tomorrow morning; of note, HepB SAb reactive, core Ab positive 4-28: LFTs looking better 4/12 improved  19. Peripheral polyneuropathy: Cymbalta  resumed 04/23 as CK trending down. Will check weekly -- See #3 above; may benefit from transition to Lyrica or trial of Qutenza as outpatient.  Was establishing with Dr. Rayleen Cal  18. Anemia of chronic disease: Likely malabsorption w/ Low iron but elevated TIBC/Ferritin -07/23/23 Hgb 9.9 on Fri, repeat weekly on Mon/Thurs; iron studies done 4/21 showing low TIBC 172, iron 46, ferritin elevated 791.  -5/8 HGB overall stable 8.5 -5/15 stable at 9.0  19. Morbid obesity: BMI 48. Followed by RD/Bariatric nutrition for wt loss. Plan for Bariatric surgery 08/2023.              --Now on aggressive vitamin supplementation as above  20. Hyponatremia: Na down to 132-->question due to IVIG 4/31-4/24.  --May see rise in LFTs again.  -4/25 recheck labs still pending, will ask nursing to call to check on this -07/23/23 Na 131 on 4/26, recheck tomorrow to see trend or need for supplementation  4/28: NA stable 132. 5/1: Na 134, stable>> 3.1 5/2; monitor  5/12 stable at 139 5/15 stable  137  21. Severe tricuspid regurgitation/grade 1 diastolic heart failure   - 4/29: add daily weights with diuretic adjustments--stable -5/2: Weights remain stable, no external edema with DC  hydrochlorothiazide .  Continue to monitor closely. -07/30/23 wt down significantly today, wonder if it's inaccurate; monitor trend -5/16 weights overall stable continue to monitor 5-19: Current renal weights unreliable. Filed Weights   08/09/23 0500 08/10/23 1500 08/14/23 0636  Weight: 65.2 kg (!) 144 kg (!) 142.6 kg    22. Gout: continue allopurinol  100mg  QD; off colchicine  for now, resume as appropriate   23. Diarrhea/constipation: -07/23/23 pt now having runny stools, thinks it's the food; stop miralax  daily and change to PRN; monitor 4-28: Ongoing liquid stool; encourage p.o. fluids, replete electrolytes as above, and adding fibercon supplemen\t   - 4/29: No Bms today; slowing down. [place] - 4-30: Diarrhea followed by smears, KUB performed to ensure no overflow incontinence, was normal. 5/1: KUB normal, patient feels she is having adequate bowel movements.   Last bowel movement 5-2, adequate per patient  LBM 5/7, consider additional medication if no BM by tomorrow -08/05/23 LBM 2 days ago per pt, 3 days ago per documentation; will see if she has BM today but if not then will start meds. Encouraged to use miralax  today  - Last bowel movement 5-20 24. Dysuria:  -07/29/23 pt stating some dysuria/difficulty with urination; ordered U/A -07/30/23 U/A not done yesterday d/t BMs everytime she urinated; will try urinal today; f/up on results  -5/5/5 U/A 5/3 with 21-50 WBC, many bacteria start kelfex for suspected UTI, culture added 08/04/23 symptoms have improved, Proteus on urine culture, change to Augmentin  and DC keflex  -08/05/23 got 3.5 days of keflex , so augmentin  through 5/14  Symptoms have resolved  25. Anxiety  -xanax  0.25mg  PRN  -5/7 BuSpar  3 times daily 5 mg 5/14 anxiety appears to be doing better, continue current regimen, infrequent use of Xanax --would discontinue  at discharge    LOS: 27 days A FACE TO FACE EVALUATION WAS PERFORMED  Bea Lime 08/16/2023, 11:43 PM

## 2023-08-16 NOTE — Discharge Summary (Signed)
 Physician Discharge Summary  Patient ID: Tiffany Velasquez MRN: [id number] DOB/AGE: [birth year] 28 y.o.  Admit date: 07/20/2023 Discharge date: 08/16/2023  Discharge Diagnoses:  Principal Problem:   Myositis associated antibody positive Active Problems:   Myositis   Adjustment disorder with mixed anxiety and depressed mood   Discharged Condition: {condition:18240}  Significant Diagnostic Studies: DG Knee 1-2 Views Left Result Date: 07/28/2023 CLINICAL DATA:  Knee pain EXAM: LEFT KNEE - 2 VIEW COMPARISON:  None Available. FINDINGS: No evidence of fracture, dislocation, or joint effusion. No evidence of arthropathy or other focal bone abnormality. Soft tissues are unremarkable. IMPRESSION: No acute osseous abnormality. Electronically Signed   By: Adrianna Horde M.D.   On: 07/28/2023 14:25   DG Abd 1 View Result Date: 07/26/2023 CLINICAL DATA:  Incontinence EXAM: ABDOMEN - 1 VIEW COMPARISON:  03/10/2005 FINDINGS: The bowel gas pattern is normal. No radio-opaque calculi or other significant radiographic abnormality are seen. IMPRESSION: No acute abnormality noted. Electronically Signed   By: Violeta Grey M.D.   On: 07/26/2023 10:15   ECHOCARDIOGRAM COMPLETE Result Date: 07/17/2023    ECHOCARDIOGRAM REPORT   Patient Name:   Tiffany Velasquez Date of Exam: 07/17/2023 Medical Rec #:  [id number]      Height:       69.0 in Accession #:    [id number]     Weight:       327.2 lb Date of Birth:  Apr 22, 1995      BSA:          2.547 m Patient Age:    28 years       BP:           127/85 mmHg Patient Gender: F              HR:           106 bpm. Exam Location:  Inpatient Procedure: 2D Echo, Cardiac Doppler and Color Doppler (Both Spectral and Color            Flow Doppler were utilized during procedure). Indications:    R00.0 Tachycardia  History:        Patient has no prior history of Echocardiogram examinations.                 Risk Factors:Hypertension.  Sonographer:    Andrena Bang Referring Phys: ATIF MAHMOOD  IMPRESSIONS  1. Left ventricular ejection fraction, by estimation, is 55%. The left ventricle has normal function. The left ventricle has no regional wall motion abnormalities. Left ventricular diastolic parameters are consistent with Grade I diastolic dysfunction (impaired relaxation).  2. Right ventricular systolic function is normal. The right ventricular size is normal. The estimated right ventricular systolic pressure is 47.1 mmHg.  3. The mitral valve is normal in structure. Trivial mitral valve regurgitation. No evidence of mitral stenosis.  4. The tricuspid valve is abnormal. Tricuspid valve regurgitation is moderate to severe.  5. The aortic valve is tricuspid. Aortic valve regurgitation is not visualized. No aortic stenosis is present.  6. The inferior vena cava is normal in size with greater than 50% respiratory variability, suggesting right atrial pressure of 3 mmHg. FINDINGS  Left Ventricle: Left ventricular ejection fraction, by estimation, is 55%. The left ventricle has normal function. The left ventricle has no regional wall motion abnormalities. The left ventricular internal cavity size was normal in size. There is no left ventricular hypertrophy. Left ventricular diastolic parameters are consistent with Grade I diastolic dysfunction (impaired relaxation). Right Ventricle: The right  ventricular size is normal. No increase in right ventricular wall thickness. Right ventricular systolic function is normal. The tricuspid regurgitant velocity is 3.32 m/s, and with an assumed right atrial pressure of 3 mmHg, the estimated right ventricular systolic pressure is 47.1 mmHg. Left Atrium: Left atrial size was normal in size. Right Atrium: Right atrial size was normal in size. Pericardium: There is no evidence of pericardial effusion. Mitral Valve: The mitral valve is normal in structure. Trivial mitral valve regurgitation. No evidence of mitral valve stenosis. Tricuspid Valve: The tricuspid valve is abnormal.  Tricuspid valve regurgitation is moderate to severe. Aortic Valve: The aortic valve is tricuspid. Aortic valve regurgitation is not visualized. No aortic stenosis is present. Aortic valve mean gradient measures 6.0 mmHg. Aortic valve peak gradient measures 12.0 mmHg. Aortic valve area, by VTI measures 2.04  cm. Pulmonic Valve: The pulmonic valve was normal in structure. Pulmonic valve regurgitation is not visualized. Aorta: The aortic root is normal in size and structure. Venous: The inferior vena cava is normal in size with greater than 50% respiratory variability, suggesting right atrial pressure of 3 mmHg. IAS/Shunts: No atrial level shunt detected by color flow Doppler.  LEFT VENTRICLE PLAX 2D LVIDd:         5.30 cm      Diastology LVIDs:         2.90 cm      LV e' medial:  12.90 cm/s LV PW:         1.10 cm      LV e' lateral: 12.60 cm/s LV IVS:        1.10 cm LVOT diam:     2.10 cm LV SV:         61 LV SV Index:   24 LVOT Area:     3.46 cm  LV Volumes (MOD) LV vol d, MOD A4C: 106.0 ml LV vol s, MOD A4C: 47.0 ml LV SV MOD A4C:     106.0 ml RIGHT VENTRICLE RV S prime:     12.70 cm/s TAPSE (M-mode): 1.1 cm LEFT ATRIUM           Index LA diam:      4.30 cm 1.69 cm/m LA Vol (A2C): 44.8 ml 17.59 ml/m LA Vol (A4C): 40.2 ml 15.79 ml/m  AORTIC VALVE AV Area (Vmax):    2.18 cm AV Area (Vmean):   2.09 cm AV Area (VTI):     2.04 cm AV Vmax:           173.00 cm/s AV Vmean:          110.000 cm/s AV VTI:            0.301 m AV Peak Grad:      12.0 mmHg AV Mean Grad:      6.0 mmHg LVOT Vmax:         109.00 cm/s LVOT Vmean:        66.500 cm/s LVOT VTI:          0.177 m LVOT/AV VTI ratio: 0.59  AORTA Ao Asc diam: 3.30 cm TRICUSPID VALVE TR Peak grad:   44.1 mmHg TR Vmax:        332.00 cm/s  SHUNTS Systemic VTI:  0.18 m Systemic Diam: 2.10 cm Dalton McleanMD Electronically signed by Archer Bear Signature Date/Time: 07/17/2023/3:07:24 PM    Final     Labs:  Basic Metabolic Panel: Recent Labs  Lab 08/10/23 0357  08/14/23 0525  NA 137 137  K 3.6 3.8  CL 113* 114*  CO2 18* 19*  GLUCOSE 88 86  BUN 11 9  CREATININE 0.60 0.65  CALCIUM  8.8* 9.0  MG 1.7 1.8    CBC: Recent Labs  Lab 08/10/23 0357 08/14/23 0525  WBC 4.0 4.9  HGB 9.0* 9.4*  HCT 29.6* 30.7*  MCV 94.6 94.5  PLT 235 264    CBG: No results for input(s): "GLUCAP" in the last 168 hours.  Brief HPI:   Tiffany Velasquez is a 28 y.o. female ***   Hospital Course: Tiffany Velasquez was admitted to rehab 07/20/2023 for inpatient therapies to consist of PT, ST and OT at least three hours five days a week. Past admission physiatrist, therapy team and rehab RN have worked together to provide customized collaborative inpatient rehab.   Blood pressures were monitored on TID basis and   Diabetes has been monitored with ac/hs CBG checks and SSI was use prn for tighter BS control.    Rehab course: During patient's stay in rehab weekly team conferences were held to monitor patient's progress, set goals and discuss barriers to discharge. At admission, patient required  He/She  has had improvement in activity tolerance, balance, postural control as well as ability to compensate for deficits. He/She has had improvement in functional use RUE/LUE  and RLE/LLE as well as improvement in awareness       Disposition: Discharge disposition: 01-Home or Self Care        Diet:  Special Instructions:  Discharge Instructions     Ambulatory referral to Rheumatology   Complete by: As directed       Allergies as of 08/16/2023       Reactions   Other Itching, Rash, Swelling   Seafood,tomato paste, peanut  butter, peaches, oranges, apples Throat swelling  Dust mite, oak trees, grass- causes rash, itching   Peanut -containing Drug Products Anaphylaxis   Shellfish Allergy  Anaphylaxis   Throat swelling   Apple Juice Rash   Orange Fruit [citrus] Rash   Peach Flavoring Agent (non-screening) Rash   Tomato Rash, Hives, Itching   Tree Extract Rash         Medication List     STOP taking these medications    amoxicillin -clavulanate 875-125 MG tablet Commonly known as: AUGMENTIN    colchicine  0.6 MG tablet   dexlansoprazole  60 MG capsule Commonly known as: Dexilant    loratadine  10 MG tablet Commonly known as: CLARITIN    metoCLOPramide  10 MG tablet Commonly known as: REGLAN    Olmesartan -amLODIPine -HCTZ 20-5-12.5 MG Tabs   ondansetron  8 MG tablet Commonly known as: ZOFRAN    thiamine  100 MG/ML injection Commonly known as: VITAMIN B1 Replaced by: thiamine  100 MG tablet       TAKE these medications    acetaminophen  325 MG tablet Commonly known as: TYLENOL  Take 1-2 tablets (325-650 mg total) by mouth every 4 (four) hours as needed for mild pain (pain score 1-3).   acetaZOLAMIDE  250 MG tablet Commonly known as: DIAMOX  Take 500 mg by mouth 2 (two) times daily.   adalimumab 40 MG/0.4ML pen Commonly known as: HUMIRA Inject 40 mg into the skin every 14 (fourteen) days.   albuterol  108 (90 Base) MCG/ACT inhaler Commonly known as: VENTOLIN  HFA Inhale 2 puffs every 4-6 hours as needed for cough, wheeze, tightness in chest, or shortness of breath What changed: Another medication with the same name was changed. Make sure you understand how and when to take each.   albuterol  (2.5 MG/3ML) 0.083% nebulizer solution Commonly known as: PROVENTIL  Take 3  mLs (2.5 mg total) by nebulization every 6 (six) hours as needed for wheezing or shortness of breath. What changed: when to take this   allopurinol  100 MG tablet Commonly known as: ZYLOPRIM  Take 1 tablet (100 mg total) by mouth daily.   azelastine  0.1 % nasal spray Commonly known as: ASTELIN  USE 2 SPRAYS IN EACH NOSTRIL TWICE DAILY AS NEEDED FOR RUNNY NOSE/DRAINAGE DOWN THROAT What changed:  how much to take when to take this   BD Heparin  PosiFlush 100 UNIT/ML Soln Generic drug: Heparin  Na (Pork) Lock Flsh PF Inject 100 Units into the vein daily as needed (Given  by nurse at home).   bimatoprost 0.01 % Soln Commonly known as: LUMIGAN Place 1 drop into both eyes at bedtime.   brimonidine  0.2 % ophthalmic solution Commonly known as: ALPHAGAN  Place 1 drop into both eyes 3 (three) times daily.   busPIRone  5 MG tablet Commonly known as: BUSPAR  Take 1 tablet (5 mg total) by mouth 3 (three) times daily.   cyanocobalamin  1000 MCG tablet Commonly known as: VITAMIN B12 Take 1 tablet (1,000 mcg total) by mouth daily. What changed:  Another medication with the same name was changed. Make sure you understand how and when to take each. Another medication with the same name was removed. Continue taking this medication, and follow the directions you see here.   cyanocobalamin  1000 MCG/ML injection Commonly known as: VITAMIN B12 Inject 1 mL (1,000 mcg total) into the muscle every 30 (thirty) days. Start taking on: September 18, 2023 What changed:  when to take this These instructions start on September 18, 2023. If you are unsure what to do until then, ask your doctor or other care provider. Another medication with the same name was removed. Continue taking this medication, and follow the directions you see here.   dorzolamide -timolol  2-0.5 % ophthalmic solution Commonly known as: COSOPT  Place 1 drop into both eyes 2 (two) times daily.   DULoxetine  30 MG capsule Commonly known as: Cymbalta  Take 1 capsule (30 mg total) by mouth 2 (two) times daily. What changed: when to take this   Ensure Max Protein Liqd Take 330 mLs (11 oz total) by mouth 2 (two) times daily.   famotidine  40 MG tablet Commonly known as: Pepcid  Take 1 tablet (40 mg total) by mouth at bedtime.   Fiber-Lax 625 MG tablet Generic drug: polycarbophil Take 1 tablet (625 mg total) by mouth daily.   fluticasone  50 MCG/ACT nasal spray Commonly known as: FLONASE  Place 1 spray into both nostrils daily.   folic acid  1 MG tablet Commonly known as: FOLVITE  Take 1 tablet (1 mg total) by mouth  daily.   gabapentin  300 MG capsule Commonly known as: NEURONTIN  Take 2 capsules (600 mg total) by mouth 2 (two) times daily AND 3 capsules (900 mg total) at bedtime. What changed: See the new instructions.   Gamunex-C 20 GM/200ML Soln Generic drug: Immune Globulin  (Human) Notes to patient: Resume home regimen   lidocaine  4 % cream Commonly known as: LMX Apply topically 3 (three) times daily.   magnesium  oxide 400 (240 Mg) MG tablet Commonly known as: MAG-OX Take 1 tablet (400 mg total) by mouth 2 (two) times daily.   melatonin 3 MG Tabs tablet Take 1 tablet (3 mg total) by mouth at bedtime as needed.   methocarbamol  500 MG tablet Commonly known as: ROBAXIN  Take 1 tablet (500 mg total) by mouth every 8 (eight) hours as needed for muscle spasms.   metoprolol  tartrate 25  MG tablet Commonly known as: LOPRESSOR  Take 0.5 tablets (12.5 mg total) by mouth 2 (two) times daily.   montelukast  10 MG tablet Commonly known as: Singulair  Take 1 tablet (10 mg total) by mouth at bedtime.   multivitamin with minerals Tabs tablet Take 1 tablet by mouth daily.   pantoprazole  40 MG tablet Commonly known as: PROTONIX  Take 1 tablet (40 mg total) by mouth daily at 6 (six) AM.   polyethylene glycol powder 17 GM/SCOOP powder Commonly known as: GLYCOLAX /MIRALAX  Take 17 g by mouth daily. What changed: when to take this   potassium chloride  10 MEQ tablet Commonly known as: KLOR-CON  M Take 3 tablets (30 mEq total) by mouth 2 (two) times daily.   Retin-A  Micro 0.04 % gel Generic drug: tretinoin  microspheres Apply 1 application  topically daily as needed.   Rhopressa  0.02 % Soln Generic drug: Netarsudil  Dimesylate Place 1 drop into the right eye at bedtime.   sucralfate  1 GM/10ML suspension Commonly known as: Carafate  Take 10 mLs (1 g total) by mouth 2 (two) times daily.   Symbicort  160-4.5 MCG/ACT inhaler Generic drug: budesonide -formoterol  Inhale 2 puffs into the lungs 2 (two) times  daily. What changed: when to take this   thiamine  100 MG tablet Commonly known as: VITAMIN B1 Take 1 tablet (100 mg total) by mouth daily. Replaces: thiamine  100 MG/ML injection   traMADol  50 MG tablet Commonly known as: ULTRAM  Take 1 tablet (50 mg total) by mouth every 12 (twelve) hours as needed for severe pain (pain score 7-10).   vitamin A  3 MG (10000 UNITS) capsule Take 1 capsule (10,000 Units total) by mouth daily.   Vitamin D  (Ergocalciferol ) 1.25 MG (50000 UNIT) Caps capsule Commonly known as: DRISDOL  Take 1 capsule (50,000 Units total) by mouth every 7 (seven) days.        Follow-up Information     Wilhemena Harbour, MD Follow up.   Specialty: Family Medicine Why: Call in 1-2 days for post hospital follow up Contact information: [address] [place] [address] [phone]         Bea Lime, DO Follow up.   Specialty: Physical Medicine and Rehabilitation Contact information: [address] [place] [address] [phone]         Edna Gouty, MD Follow up.   Specialty: Ophthalmology Contact information: [address] [place] [address] [phone]         [facility] Follow up.   Why: office will call you with follow up appointment        Patel, Donika K, DO Follow up.   Specialty: Neurology Why: Call in 1-2 days for post hospital follow up Contact information: [address] [place] [address] [phone]                 Signed: Zelda Hickman 08/16/2023, 2:08 PM

## 2023-08-17 ENCOUNTER — Telehealth: Payer: Self-pay

## 2023-08-17 DIAGNOSIS — R531 Weakness: Secondary | ICD-10-CM | POA: Diagnosis not present

## 2023-08-17 DIAGNOSIS — R5383 Other fatigue: Secondary | ICD-10-CM | POA: Diagnosis not present

## 2023-08-17 DIAGNOSIS — G998 Other specified disorders of nervous system in diseases classified elsewhere: Secondary | ICD-10-CM | POA: Diagnosis not present

## 2023-08-17 DIAGNOSIS — G63 Polyneuropathy in diseases classified elsewhere: Secondary | ICD-10-CM | POA: Diagnosis not present

## 2023-08-17 DIAGNOSIS — J189 Pneumonia, unspecified organism: Secondary | ICD-10-CM | POA: Diagnosis not present

## 2023-08-17 NOTE — Telephone Encounter (Signed)
 Tiffany Velasquez (patient's Mother) stated Tiffany Velasquez may need a left knee Cortisone or Steroid injection. The request was discussed with Lamon Pillow, during the hospital stay.  If possible please schedule this when she returns to see Dr. Rayleen Cal. (She is aware this will have to be approved by insurance).  Please advise. Thank you.

## 2023-08-17 NOTE — Progress Notes (Signed)
 Recreational Therapy Discharge Summary Patient Details  Name: Tiffany Velasquez MRN: [id number] Date of Birth: Jan 30, 1996 Today's Date: 08/17/2023  Comments on progress toward goals: Pt has made great progress during LOS and discharge home with family 5/21 at overall Mod I ambulatory level.  TR sessions focused on pt education including importance of social, emotional, spiritual health in addition to physical and their impacts on each other and continued recovery.  Interventions addressed activity tolerance, dynamic balance, UE strength, socialization, & community reintegration.  Goals met.  Reasons for discharge: discharge from hospital  Follow-up: Outpatient  Patient/family agrees with progress made and goals achieved: Yes  Avrian Delfavero 08/17/2023, 8:37 AM

## 2023-08-17 NOTE — Telephone Encounter (Addendum)
 Transitional Care Call--who you spoke with Anders Ban (Mother)   Are you/is patient experiencing any problems since coming home? None.  Are there any questions regarding any aspect of care? No questions.   Are there any questions regarding medications administration/dosing? Yes. Are meds being taken as prescribed? Yes.   Patient should review meds with caller to confirm. Medication reviewed with Anders Ban (Mother). Have there been any falls? No. Has Home Health been to the house and/or have they contacted you? No.  If not, have you tried to contact them? Mrs. Packham will call if assistance is needed. Can we help you contact them?  No. Are bowels and bladder emptying properly? Yes.  Are there any unexpected incontinence issues? No.  If applicable, is patient following bowel/bladder programs? N/a. Any fevers, problems with breathing, unexpected pain? No.  Are there any skin problems or new areas of breakdown? No. Has the patient/family member arranged specialty MD follow up (ie cardiology/neurology/renal/surgical/etc)? Appointments reviewed.  Can we help arrange? She will call back if needed. Does the patient need any other services or support that we can help arrange? Not at this time.  Are caregivers following through as expected in assisting the patient? Yes.        11. Has the patient quit smoking, drinking alcohol, or using drugs as recommended? Advice given.   Appointment  [address] with Ford Ide on 08/24/2023 at 11:00 A.M. with a 10:30 arrival.

## 2023-08-24 ENCOUNTER — Encounter: Payer: Self-pay | Admitting: Registered Nurse

## 2023-08-24 ENCOUNTER — Encounter: Attending: Registered Nurse | Admitting: Registered Nurse

## 2023-08-24 VITALS — BP 121/95 | HR 79 | Ht 69.0 in | Wt 325.0 lb

## 2023-08-24 DIAGNOSIS — R000 Tachycardia, unspecified: Secondary | ICD-10-CM | POA: Insufficient documentation

## 2023-08-24 DIAGNOSIS — G894 Chronic pain syndrome: Secondary | ICD-10-CM | POA: Diagnosis not present

## 2023-08-24 DIAGNOSIS — Q998 Other specified chromosome abnormalities: Secondary | ICD-10-CM | POA: Insufficient documentation

## 2023-08-24 DIAGNOSIS — Z79891 Long term (current) use of opiate analgesic: Secondary | ICD-10-CM | POA: Diagnosis not present

## 2023-08-24 DIAGNOSIS — G629 Polyneuropathy, unspecified: Secondary | ICD-10-CM | POA: Diagnosis not present

## 2023-08-24 DIAGNOSIS — Z5181 Encounter for therapeutic drug level monitoring: Secondary | ICD-10-CM | POA: Diagnosis not present

## 2023-08-24 MED ORDER — TRAMADOL HCL 50 MG PO TABS: 50.0000 mg | ORAL_TABLET | Freq: Two times a day (BID) | ORAL | 0 refills | Status: DC | PRN

## 2023-08-24 NOTE — Progress Notes (Signed)
 Subjective:    Patient ID: Tiffany Velasquez, female    DOB: 1995-11-26, 28 y.o.   MRN: [id number]  HPI: Tiffany Velasquez is a 28 y.o. female who is here or Transitional Care visit or follow up of her  Myositis associated antibody positive, Neuropathy and Tachycardia.  She presented to the emergency room with complaints nausea/vomiting and worsening weakness and numbness.  Dr. Baby Bolt: H&P: 07/14/2023 Tiffany Velasquez is a 28 y.o. female presenting with nausea/vomiting and worsening weakness, numbness . Differential for presentation of this includes GBS, myelopathy, inflammatory polyneuropathy, variety opportunistic infection given patient immunosuppressant medications, medication side effect.    - unclear etiology, though serious concern for some kind of myelopathy and/or infectious insult, particularly given apparently new onset pupillary defect. - Less likely GBS given timeframe of symptom onset 2 months ago, and weakness has not yet spread to diaphragm level. - consider possible medication effects from mycophenolate, however must rule out other more concerning causes as above. - may also be sequelae of previously noted vitamin deficiencies and neuropathies. Beriberi is a consideration with hx thiamine  deficiency, neuropathy, and tachycardia on presentation. Follows outpatient with neurology  Brain : MR IMPRESSION: Normal MRI appearance of the Brain.  Lumbar MR:  IMPRESSION: Stable and essentially normal Lumbar MRI, without and with contrast.   MR: Left Femur:  IMPRESSION: 1. There is edema within the majority of the muscles of the left buttock and thigh, consistent with the expected nonspecific myositis. This may be from an inflammatory, infectious, drug related, or posttraumatic etiologies. Muscle edema femoral are general myopathy can also be from neurogenic etiology. 2. Edema within muscles innervated by the superior gluteal nerve including gluteus medius and gluteus minimus muscles.  However, no significant edema within the tensor fascia lata innervated by the superior gluteal nerve. 3. Edema within muscles innervated by the obturator nerve, including obturator externus, and the left adductor longus, brevis, and magnus. However, no significant edema within the gracilis that is innervated by the anterior division of the obturator nerve. 4. Edema within muscles innervated by the tibial nerve, including the long head of the biceps femoris muscle, semimembranosus, medial and lateral head of the gastrocnemius muscles. However, no significant edema within the semitendinosis innervated by the tibial nerve. 5. Edema within quadriceps muscles innervated by muscular branches of the femoral nerve, including the rectus femoris, vastus medialis, vastus intermedius, and vastus lateralis muscles. However, no significant edema within the sartorius muscle intermediate about the femoral nerve.   Thoracic MR: IMPRESSION: 1. Partially imaged bilateral lung opacities, concerning for pneumonia. Recommend chest radiograph or chest CT to further characterize. 2. Otherwise, normal MRI of the cervical and thoracic spine.   MR: Cervical Spine IMPRESSION: 1. Partially imaged bilateral lung opacities, concerning for pneumonia. Recommend chest radiograph or chest CT to further characterize. 2. Otherwise, normal MRI of the cervical and thoracic spine.   Ms. Hoheisel was admitted to inpatient Rehabilitation on 07/20/2023 and discharged home on 08/16/2023. She reports bilateral knee pain and bilateral lower extremity pain with tingling and burning. She rates her pain 6.   Also reports she has a good appetite   Pain Inventory Average Pain 8 Pain Right Now 6 My pain is constant, burning, dull, tingling, and aching  In the last 24 hours, has pain interfered with the following? General activity 4 Relation with others 8 Enjoyment of life 7 What TIME of day is your pain at its worst? morning  , evening, and varies Sleep (in general) Fair  Pain is worse with: bending, inactivity, standing, and some activites Pain improves with: rest, pacing activities, and medication Relief from Meds: 8  walk with assistance use a cane use a walker ability to climb steps?  yes do you drive?  yes  not employed: date last employed 2025  weakness numbness tingling trouble walking spasms anxiety  Any changes since last visit?  no  Any changes since last visit?  no    Family History  Problem Relation Age of Onset   Asthma Mother    Allergic rhinitis Father    Sudden death Cousin    Eczema Neg Hx    Urticaria Neg Hx    Social History   Socioeconomic History   Marital status: Single    Spouse name: Not on file   Number of children: Not on file   Years of education: Not on file   Highest education level: Associate degree: academic program  Occupational History   Not on file  Tobacco Use   Smoking status: Never    Passive exposure: Never   Smokeless tobacco: Never  Vaping Use   Vaping status: Never Used  Substance and Sexual Activity   Alcohol use: Yes    Comment: rarely   Drug use: No   Sexual activity: Not Currently  Other Topics Concern   Not on file  Social History Narrative   Not on file   Social Drivers of Health   Financial Resource Strain: Low Risk  (05/10/2023)   Overall Financial Resource Strain (CARDIA)    Difficulty of Paying Living Expenses: Not very hard  Food Insecurity: No Food Insecurity (07/20/2023)   Hunger Vital Sign    Worried About Running Out of Food in the Last Year: Never true    Ran Out of Food in the Last Year: Never true  Transportation Needs: No Transportation Needs (07/20/2023)   PRAPARE - Administrator, Civil Service (Medical): No    Lack of Transportation (Non-Medical): No  Physical Activity: Inactive (11/18/2022)   Exercise Vital Sign    Days of Exercise per Week: 0 days    Minutes of Exercise per Session: 0 min   Stress: No Stress Concern Present (11/18/2022)   [employer] of Occupational Health - Occupational Stress Questionnaire    Feeling of Stress : Not at all  Social Connections: Moderately Integrated (07/20/2023)   Social Connection and Isolation Panel [NHANES]    Frequency of Communication with Friends and Family: Twice a week    Frequency of Social Gatherings with Friends and Family: Twice a week    Attends Religious Services: More than 4 times per year    Active Member of Clubs or Organizations: Yes    Attends Banker Meetings: More than 4 times per year    Marital Status: Never married   Past Surgical History:  Procedure Laterality Date   BIOPSY  04/04/2023   Procedure: BIOPSY;  Surgeon: Stechschulte, Avon Boers, MD;  Location: [facility];  Service: General;;   CATARACT EXTRACTION     ESOPHAGOGASTRODUODENOSCOPY N/A 04/04/2023   Procedure: ESOPHAGOGASTRODUODENOSCOPY (EGD);  Surgeon: Junie Olds, MD;  Location: [facility];  Service: General;  Laterality: N/A;   PORTA CATH INSERTION     Past Medical History:  Diagnosis Date   Asthma    Eczema    Glaucoma    Hypertension    Morbid obesity (HCC)    Transaminitis 07/15/2023   Uveitic glaucoma of both eyes, 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  BP (!) 121/95 (BP Location: Left Arm, Patient Position: Sitting)   Pulse 79   Ht 5\' 9"  (1.753 m)   Wt (!) 325 lb (147.4 kg)   LMP 06/27/2023 (Approximate) Comment: neg hcg on 07/12/23  SpO2 97%   BMI 47.99 kg/m   Opioid Risk Score:   Fall Risk Score:  `1  Depression screen [facility] 2/9     08/24/2023   11:33 AM 06/05/2023   10:42 AM 05/29/2023   10:37 AM 05/22/2023    3:28 PM 05/10/2023   11:21 AM 04/25/2023    4:56 PM 04/20/2023   11:39 AM  Depression screen PHQ 2/9  Decreased Interest 2 0 1 1 1  1   Down, Depressed, Hopeless 0 0 2 0 0  0  PHQ - 2 Score 2 0 3 1 1  1   Altered sleeping 0 0 2 0 0  0  Tired, decreased energy 2 1 2 1 1  1   Change in appetite 0 0 2 1 0   1  Feeling bad or failure about yourself  0 0 1 0 0  0  Trouble concentrating 0 0 2 0 0  0  Moving slowly or fidgety/restless 0 0 2 0 0  0  Suicidal thoughts 0 0 0 0 0  0  PHQ-9 Score 4 1 14 3 2  3   Difficult doing work/chores Somewhat difficult Not difficult at all   Somewhat difficult       Information is confidential and restricted. Go to Review Flowsheets to unlock data.     Review of Systems  All other systems reviewed and are negative.      Objective:   Physical Exam Constitutional:      Appearance: Normal appearance. She is obese.  Cardiovascular:     Rate and Rhythm: Normal rate and regular rhythm.     Pulses: Normal pulses.     Heart sounds: Normal heart sounds.  Pulmonary:     Effort: Pulmonary effort is normal.     Breath sounds: Normal breath sounds.  Musculoskeletal:     Comments: Normal Muscle Bulk and Muscle Testing Reveals:  Upper Extremities:Full  ROM and Muscle Strength 5/5  Lower Extremities: Full ROM and Muscle Strength 5/5 Arises from Table using walker for support Narrow Based  Gait     Skin:    General: Skin is warm and dry.  Neurological:     Mental Status: She is alert and oriented to person, place, and time.  Psychiatric:        Mood and Affect: Mood normal.        Behavior: Behavior normal.         Assessment & Plan:  Myositis associated antibody positive,: [facility]  was called and she scheduled for PT and OT appointments, she verbalizes understanding.   Neuropathy : Continue Gabapentin : Continue to Monitor.  Tachycardia.  Continue Metoprolol  PCP following. Continue to Monitor.   Ms. Hartsock would like to F/U with Dr Rayleen Cal, she will be scheduled in 4- 6 weeks

## 2023-08-25 ENCOUNTER — Telehealth: Payer: Self-pay | Admitting: *Deleted

## 2023-08-25 NOTE — Progress Notes (Signed)
 Complex Care Management Note  Care Guide Note 08/25/2023 Name: [name] MRN: [id number] DOB: 02/03/1996  Tiffany Velasquez is a 28 y.o. year old female who sees Kandis Ormond, DO for primary care. I reached out to Tiffany Velasquez by phone today to offer complex care management services.  [name] was given information about Complex Care Management services today including:   The Complex Care Management services include support from the care team which includes your Nurse Care Manager, Clinical Social Worker, or Pharmacist.  The Complex Care Management team is here to help remove barriers to the health concerns and goals most important to you. Complex Care Management services are voluntary, and the patient may decline or stop services at any time by request to their care team member.   Complex Care Management Consent Status: Patient agreed to services and verbal consent obtained.   Follow up plan:  Telephone appointment with complex care management team member scheduled for:  08/30/23  Encounter Outcome:  Patient Scheduled  Barnie Bora  [facility]  [facility], [facility] Guide  Direct Dial: [phone]  Fax [phone]

## 2023-08-28 NOTE — Therapy (Signed)
 OUTPATIENT PHYSICAL THERAPY NEURO EVALUATION   Patient Name: Tiffany Velasquez MRN: [id number] DOB:1995-06-15, 28 y.o., female Today's Date: 08/29/2023   PCP: Kandis Ormond, DO  REFERRING PROVIDER: Sterling Eisenmenger, PA-C     END OF SESSION:  PT End of Session - 08/29/23 1107     Visit Number 1    Number of Visits 17    Date for PT Re-Evaluation 10/28/23    Authorization Type [employer] MEDICARE    PT Start Time 1105    PT Stop Time 1145    PT Time Calculation (min) 40 min    Equipment Utilized During Treatment --    Activity Tolerance Patient tolerated treatment well    Behavior During Therapy WFL for tasks assessed/performed             Past Medical History:  Diagnosis Date   Asthma    Eczema    Glaucoma    Hypertension    Morbid obesity (HCC)    Transaminitis 07/15/2023   Uveitic glaucoma of both eyes, 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   Past Surgical History:  Procedure Laterality Date   BIOPSY  04/04/2023   Procedure: BIOPSY;  Surgeon: Junie Olds, MD;  Location: [facility];  Service: General;;   CATARACT EXTRACTION     ESOPHAGOGASTRODUODENOSCOPY N/A 04/04/2023   Procedure: ESOPHAGOGASTRODUODENOSCOPY (EGD);  Surgeon: Junie Olds, MD;  Location: [facility];  Service: General;  Laterality: N/A;   PORTA CATH INSERTION     Patient Active Problem List   Diagnosis Date Noted   Adjustment disorder with mixed anxiety and depressed mood 07/24/2023   Myositis 07/21/2023   Myositis associated antibody positive 07/20/2023   Vitamin A  deficiency 07/19/2023   Pneumonia of both lungs due to infectious organism 07/19/2023   Polyneuropathy associated with underlying disease (HCC) 07/19/2023   Myositis of left lower extremity 07/18/2023   Vitamin D  deficiency 07/18/2023   Unable to stand up 07/17/2023   Anemia 07/16/2023   Urinary retention 07/16/2023   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) 07/16/2023   Unintentional weight loss of 10% body weight within 6 months 07/16/2023   Vitamin B12 deficiency 07/16/2023   Peanut  allergy  07/15/2023   Port-A-Cath in place 07/15/2023   Immunosuppression due to drug therapy (HCC) 07/15/2023   Acetazolamide  Therapy 07/15/2023   Highly Elevated C-reactive protein (CRP) 07/15/2023   Folate deficiency 07/15/2023   Thiamine  deficiency, possible 07/15/2023   Elevated sedimentation rate measurement 07/15/2023   Transaminitis 07/15/2023   Elevated CK 07/15/2023   Weight loss, 150 lbs in 1 year 07/15/2023   Persistent nausea & vomiting 07/15/2023   Opioid Therapy 07/15/2023   Chorioretinal inflammation of both eyes 07/15/2023   mild Glaucoma, steroid induced 07/15/2023   Nausea & vomiting 07/14/2023   Chronic health problem 07/14/2023   Lower extremity weakness, bilateral 07/14/2023   Tachycardia  Multifocal PNA 07/14/2023   Hypokalemia 07/09/2023   Elevated liver enzymes 07/09/2023   AKI (acute kidney injury) (HCC) 07/08/2023   Generalized weakness  Myositis 07/08/2023   Hypotension 07/08/2023   Dehydration 07/08/2023   Paresthesia 07/08/2023   Neuropathy 06/05/2023   Gout 05/29/2023   Epigastric abdominal tenderness without rebound tenderness 05/10/2023   Fatigue 05/10/2023   Gastroesophageal reflux disease 11/08/2022   Pain in both feet 07/16/2021   Iritis 07/26/2018   Seasonal and perennial allergic rhinitis 07/26/2018   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   Panuveitis of both eyes  07/13/2018   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   Retinal edema 07/13/2018   Not well controlled moderate persistent asthma 01/16/2017   Intrinsic atopic dermatitis 01/16/2017   Hypertension 06/22/2012   Morbid obesity (HCC) 10/05/2009   Severe eczema 05/25/2006    ONSET DATE: 08/11/2023  REFERRING DIAG: M60.9 (ICD-10-CM) - Myositis 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 (ICD-10-CM) - Other specified chromosome  abnormalities G63 (ICD-10-CM) - Polyneuropathy in diseases classified elsewhere    THERAPY DIAG:  Other abnormalities of gait and mobility  Muscle weakness (generalized)  Unsteadiness on feet  Other symptoms and signs involving the nervous system  Other disturbances of skin sensation  Rationale for Evaluation and Treatment: Rehabilitation  SUBJECTIVE:                                                                                                                                                                                             SUBJECTIVE STATEMENT: Trying to get used to being back home. Having to adjust some things in her bedroom - has some little steps to get into her bed with a handrail since her bed is really tall. Before the hospital reports she had a handicap stool. 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 Was using the RW and then upgraded to the rollator and took it home. Also has a long shower chair and that has been very helpful. Stinging and burning calmed down when she was on her vitamins in the hospital. Has nerve testing coming up at the neurologist. From mid foot to toes, still feeling numb. Was given an HEP from inpatient rehab that she has been working on. Does not use her rollator in the house, currently using a cane with a 3 prong tip (it her dads, will be getting her own cane). Reports uses her cane for short distances and uses her rollator for longer distances (going to church/shopping). No falls since she was home from the hospital. Reports she is bathing herself, but has the door open, feels she is very independent at home and gotten more independent in rehab. L side feels weaker than her R   Pt accompanied by: self  PERTINENT HISTORY: HTN, focal chorioretinal inflammation-managed with IVIG monthly, borderline glaucoma and retinal edema, multiple vitamin deficiencies, chronic bilateral foot pain, gout, abnormal immunoglobulins, morbid obesity-BMI 48 was  working on weight loss with dietitian in preparation for bariatric surgery, polyneuropathy   She reported multiple falls in 2 weeks and was sent to ED for evaluation. She was found to have low folate, low vitamin D  and low vitamin A  levels, elevated CRP, low B1 less than 6. MRI brain and  spine without acute abnormality. 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 Yes: NPRS scale: 6-7/10 Pain location: bottom of legs  Pain description: can feel a little numb   Vitals:   08/29/23 1131  BP: 116/83  Pulse: 77      PRECAUTIONS: Fall  WEIGHT BEARING RESTRICTIONS: No  FALLS: Has patient fallen in last 6 months? Yes. Number of falls 6 and before hospitalization   LIVING ENVIRONMENT: Lives with: lives with mom, dad, older brother and dog [name]  Lives in: House/apartment Stairs: Yes: External: 3 steps; on left going up reports goes sideways and feels like she has more control  Has following equipment at home: Single point cane, Walker - 2 wheeled, Walker - 4 wheeled, shower chair, Grab bars, and With [facility] with 3 prong tip  Previously to hospitalization was also living with family.   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 Wants to get more upper body and leg strength   OBJECTIVE:  Note: Objective measures were completed at Evaluation unless otherwise  noted.   COGNITION: Overall cognitive status: Within functional limits for tasks assessed   SENSATION: Light touch: Impaired  and impaired bilaterally, esp more proximal in thigh area and medial ankle area  Proprioception: WFL and bilateral ankles into PF/DF  Pt also reports some tingling in her hands. Reports numbness in mid foot to toes   POSTURE: rounded shoulders, forward head, and posterior pelvic tilt  LOWER EXTREMITY MMT:    MMT Right Eval Left Eval  Hip flexion 4- 3+  Hip extension    Hip abduction 4+ 4+  Hip adduction 5 5  Hip internal rotation    Hip external rotation    Knee flexion 4+ 4+  Knee extension 4+ 4+  Ankle dorsiflexion 5 5  Ankle plantarflexion    Ankle inversion    Ankle eversion    (Blank rows = not tested)  BED MOBILITY:  Pt reports being able to get in and out of bed at home with steps and   TRANSFERS: Sit to stand: SBA  Assistive device utilized: None     Stand to sit: SBA  Assistive device utilized: None     Needs BUE support   Attempted to stand up from chair without using hands, but pt unable to perform today from standard chair, pt reliant on BUE support    GAIT: Gait pattern: step through pattern, decreased stride length, and lateral hip instability Distance walked: Clinic distances  Assistive device utilized: SPC with 3 prong tip  Level of assistance: SBA Comments: No unsteadiness noted ambulating short distances in and out of clinic    FUNCTIONAL TESTS:  5 times sit to stand: 26.4 seconds with BUE support  TREATMENT DATE:  N/A during eval     PATIENT EDUCATION: Education details: Clinical findings, POC  Person educated: Patient Education method: Explanation Education comprehension: verbalized understanding  HOME EXERCISE PROGRAM: Access Code: [email or web address] URL:  [email or web address] Date: 08/15/2023 Prepared by: Annia Kilts   Exercises - Supine Bridge  - 1 x daily - 7 x weekly - 1 sets - 5 reps - Straight Leg Raise  - 1 x daily - 7 x weekly - 1 sets - 5 reps - Single Leg Bridge  - 1 x daily - 7 x weekly - 1 sets - 5 reps - Supine 90/90 Alternating Toe Touch One Leg at a Time  - 1 x daily - 7 x weekly - 1 sets - 5 reps - Transition from belly onto hands and knees  - 1 x daily - 7 x weekly - 1 sets - 5 reps - Sit to Stand with Armchair  - 1 x daily - 7 x weekly - 1 sets - 5 reps - Standing Marching  - 1 x daily - 7 x weekly - 1 sets - 20 reps  From inpatient rehab, will revise/update as appropriate   GOALS: Goals reviewed with patient? Yes  SHORT TERM GOALS: Target date: 09/26/2023  Pt will be independent with initial HEP for strength, gait, balance in order to build upon functional gains made in therapy Baseline: Goal status: INITIAL  2.  BERG to be assessed with LTG written. Baseline:  Goal status: INITIAL  3.  Gait speed to be assessed with STG/LTG written.  Baseline:  Goal status: INITIAL  4.  TUG to be assessed with STG/LTG written.  Baseline:  Goal status: INITIAL  5.  Pt will improve 5x sit<>stand to less than or equal to 22 sec to demonstrate improved functional strength and transfer efficiency.  Baseline: 26.4 seconds with BUE support  Goal status: INITIAL   LONG TERM GOALS: Target date: 10/24/2023  Pt will be independent with final HEP for strength, gait, balance in order to build upon functional gains made in therapy Baseline:  Goal status: INITIAL  2.  BERG goal to be written.  Baseline:  Goal status: INITIAL  3.  Gait speed goal to be assessed.  Baseline:  Goal status: INITIAL  4.  TUG goal to be written.  Baseline:  Goal status: INITIAL  5.   Pt will improve 5x sit<>stand to less than or equal to 18 sec with no UE support to demonstrate improved functional strength and transfer efficiency.   Baseline:  Goal status: INITIAL  6.  Pt will ambulate at least 500' outdoors over unlevel surfaces with LRAD and mod I for improved community mobility.  Baseline:  Goal status: INITIAL  ASSESSMENT:  CLINICAL IMPRESSION: Patient is a 28 year old female referred to Neuro OPPT for myositis and polyneuropathy. Pt hospitalized and received inpatient rehab and discharged home on 08/16/23. Prior to health decline, pt was independent and ambulating with no AD and working as a [employer] officer. Pt is now not working and ambulating with either rollator or SPC with 3 prong tip.    Pt's PMH is significant for: HTN, focal chorioretinal inflammation-managed with IVIG monthly, borderline glaucoma and retinal edema, multiple vitamin deficiencies, chronic bilateral foot pain, gout, abnormal immunoglobulins, morbid obesity-BMI 48 was working on weight loss with dietitian in preparation for bariatric surgery, polyneuropathy . The following deficits were present during the exam: impaired strength, gait abnormalities, impaired balance, impaired sensation, BLE pain, postural abnormalities, postural abnormalities.  Based on 5x sit <> stand, pt is an incr risk for falls. Will perform further outcome measures at next session for balance. Pt would benefit from skilled PT to address these impairments and functional limitations to maximize functional mobility independence and decr fall risk.    OBJECTIVE IMPAIRMENTS: Abnormal gait, decreased activity tolerance, decreased balance, decreased endurance, decreased mobility, difficulty walking, decreased strength, impaired flexibility, impaired sensation, postural dysfunction, and pain.   ACTIVITY LIMITATIONS: standing, squatting, stairs, transfers, locomotion level, and caring for others  PARTICIPATION LIMITATIONS: driving, shopping, community activity, occupation, and yard work  PERSONAL FACTORS: Behavior pattern, Past/current experiences, Time since onset of  injury/illness/exacerbation, and 3+ comorbidities: HTN, focal chorioretinal inflammation-managed with IVIG monthly, borderline glaucoma and retinal edema, multiple vitamin deficiencies, chronic bilateral foot pain, gout, abnormal immunoglobulins, morbid obesity-BMI 48 was working on weight loss with dietitian in preparation for bariatric surgery, polyneuropathy  are also affecting patient's functional outcome.   REHAB POTENTIAL: Good  CLINICAL DECISION MAKING: Evolving/moderate complexity  EVALUATION COMPLEXITY: Moderate  PLAN:  PT FREQUENCY: 2x/week  PT DURATION: 8 weeks   PLANNED INTERVENTIONS: 97164- PT Re-evaluation, 97110-Therapeutic exercises, 97530- Therapeutic activity, [id number]- Neuromuscular re-education, 97535- Self Care, [id number]- Manual therapy, [phone]- Gait training, [phone]- Aquatic Therapy, Patient/Family education, Balance training, Stair training, Vestibular training, Cognitive remediation, and DME instructions  PLAN FOR NEXT SESSION: perform gait speed, TUG, BERG and write goals. Review previous HEP from inpatient rehab and update as appropriate.    Seabron [place], PT, DPT 08/29/2023, 3:04 PM

## 2023-08-29 ENCOUNTER — Ambulatory Visit: Attending: Physician Assistant | Admitting: Physical Therapy

## 2023-08-29 ENCOUNTER — Encounter: Payer: Self-pay | Admitting: Physical Therapy

## 2023-08-29 VITALS — BP 116/83 | HR 77

## 2023-08-29 DIAGNOSIS — R2689 Other abnormalities of gait and mobility: Secondary | ICD-10-CM | POA: Diagnosis present

## 2023-08-29 DIAGNOSIS — M6281 Muscle weakness (generalized): Secondary | ICD-10-CM

## 2023-08-29 DIAGNOSIS — R29818 Other symptoms and signs involving the nervous system: Secondary | ICD-10-CM

## 2023-08-29 DIAGNOSIS — G629 Polyneuropathy, unspecified: Secondary | ICD-10-CM | POA: Insufficient documentation

## 2023-08-29 DIAGNOSIS — M25562 Pain in left knee: Secondary | ICD-10-CM | POA: Diagnosis present

## 2023-08-29 DIAGNOSIS — G63 Polyneuropathy in diseases classified elsewhere: Secondary | ICD-10-CM | POA: Diagnosis present

## 2023-08-29 DIAGNOSIS — R208 Other disturbances of skin sensation: Secondary | ICD-10-CM

## 2023-08-29 DIAGNOSIS — Q998 Other specified chromosome abnormalities: Secondary | ICD-10-CM | POA: Diagnosis present

## 2023-08-29 DIAGNOSIS — R2681 Unsteadiness on feet: Secondary | ICD-10-CM

## 2023-08-29 LAB — DRUG TOX MONITOR 1 W/CONF, ORAL FLD
Amphetamines: NEGATIVE ng/mL (ref <10)
Barbiturates: NEGATIVE ng/mL (ref <10)
Benzodiazepines: NEGATIVE ng/mL (ref <0.50)
Buprenorphine: NEGATIVE ng/mL (ref <0.10)
Cocaine: NEGATIVE ng/mL (ref <5.0)
Cotinine: 23.4 ng/mL — ABNORMAL HIGH (ref <5.0)
Fentanyl: NEGATIVE ng/mL (ref <0.10)
Heroin Metabolite: NEGATIVE ng/mL (ref <1.0)
MARIJUANA: NEGATIVE ng/mL (ref <2.5)
MDMA: NEGATIVE ng/mL (ref <10)
Meprobamate: NEGATIVE ng/mL (ref <2.5)
Methadone: NEGATIVE ng/mL (ref <5.0)
Nicotine Metabolite: POSITIVE ng/mL — AB (ref <5.0)
Opiates: NEGATIVE ng/mL (ref <2.5)
Phencyclidine: NEGATIVE ng/mL (ref <10)
Tapentadol: NEGATIVE ng/mL (ref <5.0)
Tramadol: 171.2 ng/mL — ABNORMAL HIGH (ref <5.0)
Tramadol: POSITIVE ng/mL — AB (ref <5.0)
Zolpidem: NEGATIVE ng/mL (ref <5.0)

## 2023-08-29 LAB — DRUG TOX ALC METAB W/CON, ORAL FLD: Alcohol Metabolite: NEGATIVE ng/mL (ref <25)

## 2023-08-30 ENCOUNTER — Other Ambulatory Visit: Payer: Self-pay

## 2023-08-30 ENCOUNTER — Encounter: Admitting: Registered Nurse

## 2023-08-30 NOTE — Patient Instructions (Signed)
 Visit Information  Thank you for taking time to visit with me today. Please don't hesitate to contact me if I can be of assistance to you before our next scheduled appointment.  Our next appointment is by telephone on 09/13/23 at 10 AM Please call the care guide team at [phone] if you need to cancel or reschedule your appointment.   Following is a copy of your care plan:   Goals Addressed             This Visit's Progress    VBCI RN Care Plan related to myositis   On track    Problems:  Chronic Disease Management support and education needs related to Myositis  Goal: Over the next 30 days the Patient will attend all scheduled medical appointments: PT/OT, PCP as evidenced by completed visit notes uploaded to EMR        demonstrate Ongoing adherence to prescribed treatment plan for Myositis as evidenced by patient report of decreased pain, weakness, and fatigue take all medications exactly as prescribed and will call provider for medication related questions as evidenced by patient report of medication adherence    verbalize basic understanding of Myositis disease process and self health management plan as evidenced by patient verbalization of plan of care Report no falls since previous CMRN visit Experience no hospitalizations or ED visits since previous [facility] visit  Interventions:   Evaluation of current treatment plan related to myositis,  self-management and patient's adherence to plan as established by provider. Discussed plans with patient for ongoing care management follow up and provided patient with direct contact information for care management team Evaluation of current treatment plan related to myositis and patient's adherence to plan as established by provider Reviewed medications with patient and discussed importance of medication adherence Reviewed scheduled/upcoming provider appointments including PCP 09/01/23 and PT/OT Discussed plans with patient for ongoing care  management follow up and provided patient with direct contact information for care management team Screening for signs and symptoms of depression related to chronic disease state  Assessed social determinant of health barriers Ensured patient has rheumatology contact information  Patient Self-Care Activities:  Attend all scheduled provider appointments Call provider office for new concerns or questions  Perform all self care activities independently  Take medications as prescribed    Plan:  Telephone follow up appointment with care management team member scheduled for:  09/13/23 at 10 AM             Please call the Suicide and Crisis Lifeline: 988 call 1-800-273-TALK (toll free, 24 hour hotline) if you are experiencing a Mental Health or Behavioral Health Crisis or need someone to talk to.  Patient verbalizes understanding of instructions and care plan provided today and agrees to view in MyChart. Active MyChart status and patient understanding of how to access instructions and care plan via MyChart confirmed with patient.     Theodora Fish, RN MSN [place]  VBCI Population Health RN Care Manager Direct Dial: [phone]  Fax: [phone]

## 2023-08-30 NOTE — Patient Outreach (Signed)
 Complex Care Management   Visit Note  08/30/2023  Name:  Tiffany Velasquez MRN: [id number] DOB: Jul 18, 1995  Situation: Referral received for Complex Care Management related to prolonged [facility] stay due to Myositis I obtained verbal consent from Patient.  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    Assessment: Patient Reported Symptoms:  Cognitive Cognitive Status: Alert and oriented to person, place, and time, Able to follow simple commands, Normal speech and language skills Cognitive/Intellectual Conditions Management [RPT]: None reported or documented in medical history or problem list      Neurological Neurological Review of Symptoms: No symptoms reported    HEENT HEENT Symptoms Reported: No symptoms reported HEENT Conditions: Vision problem(Velasquez) Vision Problems:  (Retinal inflammation) HEENT Management Strategies: Routine screening, Medical device (Wears eyeglasses) Vision problem(Velasquez)  Cardiovascular Cardiovascular Symptoms Reported: Swelling in legs or feet ("A little bit" of swelling in her legs when she stands for a long period of time. Relieved with resting.) Does patient have uncontrolled Hypertension?: No Cardiovascular Conditions: Hypertension Cardiovascular Management Strategies: Medication therapy, Adequate rest  Respiratory Respiratory Symptoms Reported: No symptoms reported Respiratory Conditions: Asthma  Endocrine Patient reports the following symptoms related to hypoglycemia or hyperglycemia : No symptoms reported Is patient diabetic?: No    Gastrointestinal Gastrointestinal Symptoms Reported: No symptoms reported (Last BM today) Additional Gastrointestinal Details: Patient reports her appetite is great. Gastrointestinal Conditions:  Reflux/heartburn Gastrointestinal Management Strategies: Medication therapy Nutrition Risk Screen (CP): No indicators present  Genitourinary Genitourinary Symptoms Reported: No symptoms reported    Integumentary Integumentary Symptoms Reported: No symptoms reported    Musculoskeletal Musculoskelatal Symptoms Reviewed: Other, Weakness Other Musculoskeletal Symptoms: Patient reports some achiness in her legs since being home, relieved with exercise. She had evaluation with PT yesterday and OT tomorow. Weakness in the legs/thigh. Patient reports feeling much better with the addition of vitamins. Musculoskeletal Conditions: Other Other Musculoskeletal Conditions: Myositis Musculoskeletal Management Strategies: Medical device, Adequate rest, Exercise Musculoskeletal Comment: Patient reports she has not heard from rheumatology yet. She plans to call today to inquire about referral. Falls in the past year?: Yes Number of falls in past year: 2 or more (6) Was there an injury with Fall?: No Fall Risk Category Calculator: 2 Patient Fall Risk Level: Moderate Fall Risk Patient at Risk for Falls Due to: History of fall(Velasquez), Impaired mobility Fall risk Follow up: Falls evaluation completed, Education provided, Falls prevention discussed  Psychosocial Psychosocial Symptoms Reported: Anxiety - if selected complete GAD (One episode of anxiety when she returned home from the Velas[facility]. Patient reports that her anxiety medication works well.) Behavioral Management Strategies: Medication therapy, Coping strategies, Support system Major Change/Loss/Stressor/Fears (CP): Medical condition, self Techniques to Cope with Loss/Stress/Change: Medication Quality of Family Relationships: helpful, involved, supportive Do you feel physically threatened by others?: No      08/30/2023   10:41 AM  Depression screen PHQ 2/9  Decreased Interest 0  Down, Depressed, Hopeless 0  PHQ - 2 Score 0    Vitals:   08/30/23 1015   BP: 118/83    Medications Reviewed Today     Reviewed by Tiffany Garland, RN (Registered Nurse) on 08/30/23 at 1026  Med List Status: <None>   Medication Order Taking? Sig Documenting  Provider Last Dose Status Informant  acetaminophen  (TYLENOL ) 325 MG tablet [id number]  Take 1-2 tablets (325-650 mg total) by mouth every 4 (four) hours as needed for mild pain (pain score 1-3). Tiffany Hickman, PA-C  Active   acetaZOLAMIDE  (DIAMOX ) 250 MG tablet [id number]  Take 500 mg by mouth 2 (two) times daily. Provider, Historical, Tiffany Velasquez  Active Self, Pharmacy Records           Med Note Tiffany Velasquez, Tiffany Velasquez   Fri Jul 14, 2023  9:00 PM) LF: 07/12/23 for a 30ds  Adalimumab 40 MG/0.4ML PNKT [id number]  Inject 40 mg into the skin every 14 (fourteen) days. Provider, Historical, Tiffany Velasquez  Active Self, Pharmacy Records  albuterol  (PROVENTIL ) (2.5 MG/3ML) 0.083% nebulizer solution [id number]  Take 3 mLs (2.5 mg total) by nebulization every 6 (six) hours as needed for wheezing or shortness of breath.  Patient taking differently: Take 2.5 mg by nebulization every 4 (four) hours as needed for wheezing or shortness of breath.   Tiffany Chuck, Tiffany Velasquez  Active Self, Pharmacy Records  albuterol  (VENTOLIN  HFA) 108 [phone]) MCG/ACT inhaler [id number]  Inhale 2 puffs every 4-6 hours as needed for cough, wheeze, tightness in chest, or shortness of breath Ambs, Tiffany Millet, Tiffany Velasquez  Active Self, Pharmacy Records           Med Note Tiffany Velasquez, Tiffany Velasquez   Sat Jul 08, 2023  1:05 AM)    allopurinol  (ZYLOPRIM ) 100 MG tablet [id number]  Take 1 tablet (100 mg total) by mouth daily. Tiffany Huh, Tiffany Velasquez  Active Self, Pharmacy Records           Med Note Tiffany Velasquez, Tiffany Velasquez   Fri Jul 14, 2023  9:26 PM) LF: 06/26/23 for a 30ds  azelastine  (ASTELIN ) 0.1 % nasal spray [id number]  USE 2 SPRAYS IN EACH NOSTRIL TWICE DAILY AS NEEDED FOR RUNNY NOSE/DRAINAGE DOWN THROAT  Patient taking differently: 2 sprays 2 (two) times daily. USE 2 SPRAYS IN EACH NOSTRIL TWICE  DAILY AS NEEDED FOR RUNNY NOSE/DRAINAGE DOWN THROAT   Tiffany Chuck, Tiffany Velasquez  Active Self, Pharmacy Records  bimatoprost (LUMIGAN) 0.01 % SOLN [id number]  Place 1 drop into both eyes at bedtime. Provider, Historical, Tiffany Velasquez  Active Self, Pharmacy Records           Med Note Tiffany Velasquez, Tiffany Velasquez   Fri Jul 14, 2023  9:27 PM) LF: 05/25/23 for a 50ds  brimonidine  (ALPHAGAN ) 0.2 % ophthalmic solution [id number]  Place 1 drop into both eyes 3 (three) times daily. Provider, Historical, Tiffany Velasquez  Active Self, Pharmacy Records           Med Note Tiffany Velasquez, Tiffany Velasquez   Fri Jul 14, 2023  9:25 PM) LF: 06/28/23 for a 50ds  budesonide -formoterol  (SYMBICORT ) 160-4.5 MCG/ACT inhaler [id number]  Inhale 2 puffs into the lungs 2 (two) times daily. Tiffany Hickman, PA-C  Active   busPIRone  (BUSPAR ) 5 MG tablet [id number]  Take 1 tablet (5 mg total) by mouth 3 (three) times daily. Tiffany Hickman, PA-C  Active   cyanocobalamin  (VITAMIN B12) 1000 MCG/ML injection [id number]  Inject 1 mL (1,000 mcg total) into the muscle every 30 (thirty) days. Love, Pamela S, PA-C  Active   cyanocobalamin  1000 MCG tablet [id number]  Take 1 tablet (1,000 mcg total) by mouth daily. Tiffany Hickman, PA-C  Active   dorzolamide -timolol  (COSOPT ) 2-0.5 % ophthalmic solution [id number]  Place 1 drop into both eyes 2 (two) times daily. Provider, Historical, Tiffany Velasquez  Active Self, Pharmacy Records  Med Note Tiffany Velasquez, Tiffany Velasquez   Fri Jul 14, 2023  9:25 PM) LF: 07/02/23 for a 50ds  DULoxetine  (CYMBALTA ) 30 MG capsule [id number]  Take 1 capsule (30 mg total) by mouth 2 (two) times daily. Tiffany Hickman, PA-C  Active   Ensure Max Protein (ENSURE MAX PROTEIN) LIQD [id number]  Take 330 mLs (11 oz total) by mouth 2 (two) times daily. Clem Currier, DO  Active   famotidine  (PEPCID ) 40 MG tablet [id number]  Take 1 tablet (40 mg total) by mouth at bedtime. Edmonia Gottron, PA-C  Active Self, Pharmacy Records  fluticasone  (FLONASE ) 50 MCG/ACT nasal spray [id number]   Place 1 spray into both nostrils daily. Ardie Kras, Tiffany Velasquez  Active Self, Pharmacy Records           Med Note (LEE, NICOLE   Fri Jul 14, 2023 10:00 PM) Only uses when the Astelin  doesn't work   folic acid  (FOLVITE ) 1 MG tablet [id number]  Take 1 tablet (1 mg total) by mouth daily. Tiffany Hickman, PA-C  Active   gabapentin  (NEURONTIN ) 300 MG capsule [id number]  Take 2 capsules (600 mg total) by mouth 2 (two) times daily AND 3 capsules (900 mg total) at bedtime. Love, Pamela S, PA-C  Active   GAMUNEX-C 20 GM/200ML SOLN [id number]   Provider, Historical, Tiffany Velasquez  Active Self, Pharmacy Records           Med Note [employer] [facility], ADRIENNE B   Mon Jul 17, 2023  3:03 PM) Rph confirmed w/ infusion pharmacy - next session due 07/15/23  Heparin  Na, Pork, Lock Flsh PF (BD HEPARIN  POSIFLUSH) 100 UNIT/ML SOLN [id number]  Inject 100 Units into the vein daily as needed (Given by nurse at home). Provider, Historical, Tiffany Velasquez  Active            Med Note Merlyn Starring, NICOLE   Fri Jul 14, 2023 11:14 PM) Unable to find guidance for dosage.  Patient states injection/flush is given by her nurse at home.     lidocaine  (LMX) 4 % cream [id number]  Apply topically 3 (three) times daily. Tiffany Hickman, PA-C  Active   magnesium  oxide (MAG-OX) 400 (240 Mg) MG tablet [id number]  Take 1 tablet (400 mg total) by mouth 2 (two) times daily. Tiffany Hickman, PA-C  Active   melatonin 3 MG TABS tablet [id number]  Take 1 tablet (3 mg total) by mouth at bedtime as needed. Tiffany Hickman, PA-C  Active   methocarbamol  (ROBAXIN ) 500 MG tablet [id number]  Take 1 tablet (500 mg total) by mouth every 8 (eight) hours as needed for muscle spasms. Tiffany Hickman, PA-C  Active   metoprolol  tartrate (LOPRESSOR ) 25 MG tablet [id number]  Take 0.5 tablets (12.5 mg total) by mouth 2 (two) times daily. Tiffany Hickman, PA-C  Active   montelukast  (SINGULAIR ) 10 MG tablet [id number]  Take 1 tablet (10 mg total) by mouth at bedtime. Tiffany Chuck, Tiffany Velasquez  Active Self, Pharmacy Records   Multiple Vitamin (MULTIVITAMIN WITH MINERALS) TABS tablet [id number]  Take 1 tablet by mouth daily. Clem Currier, DO  Active   pantoprazole  (PROTONIX ) 40 MG tablet [id number]  Take 1 tablet (40 mg total) by mouth daily at 6 (six) AM. Love, Renay Carota, PA-C  Active   polycarbophil (FIBERCON) 625 MG tablet [id number]  Take 1 tablet (625 mg total) by mouth daily. Tiffany Hickman, PA-C  Active   polyethylene glycol powder (GLYCOLAX /MIRALAX ) 17 GM/SCOOP powder [id number]  Take 17 g by mouth daily.  Patient taking differently: Take 17 g by mouth in the morning and at bedtime.   Wilhemena Harbour, Tiffany Velasquez  Active Self, Pharmacy Records           Med Note Tiffany Velasquez, Tiffany Velasquez   Fri Jul 14, 2023  9:42 PM) LF: 05/11/23 for a 30ds  potassium chloride  (KLOR-CON  M) 10 MEQ tablet [id number]  Take 3 tablets (30 mEq total) by mouth 2 (two) times daily. Love, Pamela S, PA-C  Active   RETIN-A  MICRO 0.04 % gel [id number]  Apply 1 application  topically daily as needed. Provider, Historical, Tiffany Velasquez  Active Self, Pharmacy Records           Med Note Merlyn Starring, NICOLE   Fri Jul 14, 2023 10:51 PM) Rcvd OTC  RHOPRESSA  0.02 % SOLN [id number]  Place 1 drop into the right eye at bedtime. Provider, Historical, Tiffany Velasquez  Active Self, Pharmacy Records           Med Note Tiffany Velasquez, Tiffany Velasquez   Fri Jul 14, 2023  9:27 PM) LF: 06/09/23 for a 50ds  sucralfate  (CARAFATE ) 1 GM/10ML suspension [id number]  Take 10 mLs (1 g total) by mouth 2 (two) times daily. Tiffany Hickman, PA-C  Active   thiamine  (VITAMIN B1) 100 MG tablet [id number]  Take 1 tablet (100 mg total) by mouth daily. Tiffany Hickman, PA-C  Active   traMADol  (ULTRAM ) 50 MG tablet [id number]  Take 1 tablet (50 mg total) by mouth every 12 (twelve) hours as needed for severe pain (pain score 7-10). Jodi Munroe, NP  Active   vitamin A  3 MG (10000 UNITS) capsule [id number]  Take 1 capsule (10,000 Units total) by mouth daily. Love, Pamela S, PA-C  Active   Vitamin D , Ergocalciferol , (DRISDOL ) 1.25 MG  (50000 UNIT) CAPS capsule [id number]  Take 1 capsule (50,000 Units total) by mouth every 7 (seven) days. Jairo Mayer  Active             Recommendation:   PCP Follow-up Specialty provider follow-up PT/OT as scheduled Continue Current Plan of Care  Follow Up Plan:   Telephone follow up appointment date/time:  09/13/23 at 10 AM  Theodora Fish, RN MSN   [facility] RN Care Manager Direct Dial: [phone]  Fax: [phone]

## 2023-08-31 ENCOUNTER — Ambulatory Visit: Admitting: Occupational Therapy

## 2023-08-31 ENCOUNTER — Encounter: Payer: Self-pay | Admitting: Occupational Therapy

## 2023-08-31 DIAGNOSIS — R2681 Unsteadiness on feet: Secondary | ICD-10-CM | POA: Diagnosis not present

## 2023-08-31 DIAGNOSIS — R2689 Other abnormalities of gait and mobility: Secondary | ICD-10-CM | POA: Diagnosis not present

## 2023-08-31 DIAGNOSIS — M6281 Muscle weakness (generalized): Secondary | ICD-10-CM | POA: Diagnosis not present

## 2023-08-31 DIAGNOSIS — G629 Polyneuropathy, unspecified: Secondary | ICD-10-CM

## 2023-08-31 DIAGNOSIS — R208 Other disturbances of skin sensation: Secondary | ICD-10-CM

## 2023-08-31 DIAGNOSIS — G63 Polyneuropathy in diseases classified elsewhere: Secondary | ICD-10-CM | POA: Diagnosis not present

## 2023-08-31 DIAGNOSIS — M25562 Pain in left knee: Secondary | ICD-10-CM | POA: Diagnosis not present

## 2023-08-31 DIAGNOSIS — Q998 Other specified chromosome abnormalities: Secondary | ICD-10-CM | POA: Diagnosis not present

## 2023-08-31 DIAGNOSIS — R29818 Other symptoms and signs involving the nervous system: Secondary | ICD-10-CM

## 2023-08-31 NOTE — Therapy (Unsigned)
 OUTPATIENT OCCUPATIONAL THERAPY NEURO EVALUATION  Patient Name: Tiffany Velasquez MRN: [id number] DOB:08-10-1995, 28 y.o., female Today's Date: 08/31/2023  PCP: Tiffany Ormond, DO  REFERRING PROVIDER: Sterling Eisenmenger, PA-C  END OF SESSION:  OT End of Session - 08/31/23 1453     Visit Number 1    Number of Visits 7    Date for OT Re-Evaluation 10/06/23    Authorization Type [employer]    OT Start Time 1452    OT Stop Time 1529    OT Time Calculation (min) 37 min    Activity Tolerance Patient tolerated treatment well    Behavior During Therapy [facility] for tasks assessed/performed             Past Medical History:  Diagnosis Date   Asthma    Eczema    Glaucoma    Hypertension    Morbid obesity (HCC)    Transaminitis 07/15/2023   Uveitic glaucoma of both eyes, 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   Past Surgical History:  Procedure Laterality Date   BIOPSY  04/04/2023   Procedure: BIOPSY;  Surgeon: Tiffany Olds, MD;  Location: [facility];  Service: General;;   CATARACT EXTRACTION     ESOPHAGOGASTRODUODENOSCOPY N/A 04/04/2023   Procedure: ESOPHAGOGASTRODUODENOSCOPY (EGD);  Surgeon: Tiffany Olds, MD;  Location: [facility];  Service: General;  Laterality: N/A;   PORTA CATH INSERTION     Patient Active Problem List   Diagnosis Date Noted   Adjustment disorder with mixed anxiety and depressed mood 07/24/2023   Myositis 07/21/2023   Myositis associated antibody positive 07/20/2023   Vitamin A  deficiency 07/19/2023   Pneumonia of both lungs due to infectious organism 07/19/2023   Polyneuropathy associated with underlying disease (HCC) 07/19/2023   Myositis of left lower extremity 07/18/2023   Vitamin D  deficiency 07/18/2023   Unable to stand up 07/17/2023   Anemia 07/16/2023   Urinary retention 07/16/2023   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) 07/16/2023   Unintentional weight loss of  10% body weight within 6 months 07/16/2023   Vitamin B12 deficiency 07/16/2023   Peanut  allergy  07/15/2023   Port-A-Cath in place 07/15/2023   Immunosuppression due to drug therapy (HCC) 07/15/2023   Acetazolamide  Therapy 07/15/2023   Highly Elevated C-reactive protein (CRP) 07/15/2023   Folate deficiency 07/15/2023   Thiamine  deficiency, possible 07/15/2023   Elevated sedimentation rate measurement 07/15/2023   Transaminitis 07/15/2023   Elevated CK 07/15/2023   Weight loss, 150 lbs in 1 year 07/15/2023   Persistent nausea & vomiting 07/15/2023   Opioid Therapy 07/15/2023   Chorioretinal inflammation of both eyes 07/15/2023   mild Glaucoma, steroid induced 07/15/2023   Nausea & vomiting 07/14/2023   Chronic health problem 07/14/2023   Lower extremity weakness, bilateral 07/14/2023   Tachycardia  Multifocal PNA 07/14/2023   Hypokalemia 07/09/2023   Elevated liver enzymes 07/09/2023   AKI (acute kidney injury) (HCC) 07/08/2023   Generalized weakness  Myositis 07/08/2023   Hypotension 07/08/2023   Dehydration 07/08/2023   Paresthesia 07/08/2023   Neuropathy 06/05/2023   Gout 05/29/2023   Epigastric abdominal tenderness without rebound tenderness 05/10/2023   Fatigue 05/10/2023   Gastroesophageal reflux disease 11/08/2022   Pain in both feet 07/16/2021   Iritis 07/26/2018   Seasonal and perennial allergic rhinitis 07/26/2018   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  Retinal edema 07/13/2018   Not well controlled moderate persistent asthma 01/16/2017   Intrinsic atopic dermatitis 01/16/2017   Hypertension 06/22/2012   Morbid obesity (HCC) 10/05/2009   Severe eczema 05/25/2006    ONSET DATE: 08/15/2023 (Date of referral)  REFERRING DIAG: M60.9 (ICD-10-CM) - Myositis, unspecified 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 Her family has really been helpful in her recovery process, but she is still unable to do things at her prior level.  Pt accompanied by: self  PERTINENT HISTORY: HTN, focal chorioretinal inflammation-managed with IVIG monthly, borderline glaucoma and retinal edema, multiple vitamin deficiencies, chronic bilateral foot pain, gout, abnormal immunoglobulins, morbid obesity-BMI 48 was working on weight loss with dietitian in preparation for bariatric surgery, polyneuropathy    She reported multiple falls in 2 weeks and was sent to ED for evaluation. She was found to have low folate, low vitamin D  and low vitamin A  levels, elevated CRP, low B1 less than 6. MRI brain and spine without acute abnormality. 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 Yes: NPRS scale: 5/10 Pain location: general Pain description: neuropathy with joint pain Aggravating factors: walking Relieving factors: rest  FALLS: Has patient fallen in last 6  months? Yes. Number of falls 6  LIVING ENVIRONMENT: Lives with: lives with mom, dad, older brother and dog Tiffany Velasquez  Lives in: House/apartment Stairs: Yes: External: 3 steps; on left going up reports goes sideways and feels like she has more control  Has following equipment at home: Single point cane, Walker - 2 wheeled, Walker - 4 wheeled, shower chair, Grab bars, and With [facility] with 3 prong tip  Previously to hospitalization was also living with family.    PLOF: Independent and Vocation/Vocational requirements: was a 3rd grade teacher; was driving; student at [employer]    PATIENT GOALS: Wants to be more independent, feels like she is still teetering some when she is walking. Wants to get more upper body and leg strength   OBJECTIVE:  Note: Objective measures were completed at Evaluation unless otherwise noted.  HAND DOMINANCE: Right  ADLs: Overall ADLs: mod I  IADLs: Shopping: min A Meal Prep: dependent Community mobility: dependent Medication management: setup Financial management: mod I Typing: mod I  MOBILITY STATUS: Needs Assist: depending on day, pt requiring use of AD and supervision  ACTIVITY TOLERANCE: Activity tolerance: good  FUNCTIONAL OUTCOME MEASURES: PSFS: 3.7   Total score = sum of the activity scores/number of activities Minimum detectable change (90%CI) for average score = 2 points Minimum detectable change (90%CI) for single activity score = 3 points  UPPER  EXTREMITY ROM:   BUE: WNL  UPPER EXTREMITY MMT:     RUE: WFL poor endurance LUE: WFL  HAND FUNCTION: Grip strength: Right: 55.5 lbs; Left: 40.3 lbs  COORDINATION: 9 Hole Peg test: Right: 24 sec; Left: 23 sec  SENSATION: Paresthesias reported B - reports EMG/nerve conduction study coming up 09/21/2023  EDEMA: none reported or observed  MUSCLE TONE:WFL  COGNITION: Overall cognitive status: Within functional limits for tasks assessed  VISION: Subjective report: no changes  Baseline vision:  Wears glasses for distance only Visual history: cataracts  VISION ASSESSMENT: WFL  PERCEPTION: WFL  PRAXIS: WFL  OBSERVATIONS: Pt arrives with fast food in hand. Slow, altered gait, though no LOB; fairly well-kept; anxious                                                                                                                           TREATMENT :    OT initiated RUE and LUE green Thera-Band HEP including shoulder flexion, horizontal abd/add, bicep curl, and tricep extension to promote strengthening of affected extremity and overall endurance as noted in pt instructions.    PATIENT EDUCATION: Education details: OT Role and POC; BUE green theraband HEP Person educated: Patient Education method: Explanation, Demonstration, Verbal cues, and Handouts Education comprehension: verbalized understanding, verbal cues required, and needs further education  HOME EXERCISE PROGRAM: 08/31/2023: BUE green theraband HEP  GOALS:  SHORT TERM GOALS: Target date: 09/28/2023   Patient will demonstrate initial B UE HEP with 25% verbal cues or less for proper execution.  Baseline: Goal status: INITIAL  2.  Pt will independently recall the 5 main sensory precautions (cold, heat, sharp, chemical, and heavy) as needed to prevent injury/harm secondary to impairments.   Baseline:  Goal status: INITIAL  3.  Patient will independently verbalize at least 3 energy conservation principles in relation to ADLs to increase functional independence.  Baseline:  Goal status: INITIAL  LONG TERM GOALS: Target date: 10/06/2023  Patient will demonstrate updated B UE HEP with visual handouts only for proper execution. Baseline:  Goal status: INITIAL  2.  Patient will report at least two-point increase in average PSFS score or at least three-point increase in a single activity score indicating functionally significant improvement given minimum detectable change.  Baseline: 3.7 total score (See above for  individual activity scores) Goal status: INITIAL   ASSESSMENT:  CLINICAL IMPRESSION: Patient is a 28 y.o. female who was seen today for occupational therapy evaluation for myositis and polyneuropathy. Hx includes Cataract extraction, eczema, HTN, asthma, borderline glaucoma (OU), retinal edema, gout, hypotension, port-a-cath placement, anxiety, and depression. Patient currently presents below baseline level of functioning demonstrating functional deficits and impairments as noted below. Pt would benefit from skilled OT services in the outpatient setting to work on impairments as noted below to help pt return to PLOF as able.    PERFORMANCE DEFICITS: in functional skills including ADLs, IADLs, strength, endurance, and UE functional use.   IMPAIRMENTS: are limiting  patient from ADLs, IADLs, education, work, play, leisure, and social participation.   CO-MORBIDITIES: may have co-morbidities  that affects occupational performance. Patient will benefit from skilled OT to address above impairments and improve overall function.  MODIFICATION OR ASSISTANCE TO COMPLETE EVALUATION: Min-Moderate modification of tasks or assist with assess necessary to complete an evaluation.  OT OCCUPATIONAL PROFILE AND HISTORY: Detailed assessment: Review of records and additional review of physical, cognitive, psychosocial history related to current functional performance.  CLINICAL DECISION MAKING: Moderate - several treatment options, min-mod task modification necessary  REHAB POTENTIAL: Good  EVALUATION COMPLEXITY: Moderate    PLAN:  OT FREQUENCY: 1x/week  OT DURATION: 6 weeks  PLANNED INTERVENTIONS: 97168 OT Re-evaluation, 97535 self care/ADL training, 16109 therapeutic exercise, 97530 therapeutic activity, 97112 neuromuscular re-education, 97140 manual therapy, 97035 ultrasound, 97018 paraffin, 60454 fluidotherapy, 97010 moist heat, functional mobility training, energy conservation, coping strategies  training, patient/family education, and DME and/or AE instructions  RECOMMENDED OTHER SERVICES: N/A for this visit  CONSULTED AND AGREED WITH PLAN OF CARE: Patient  PLAN FOR NEXT SESSION: EC strategies; review theraband HEP; initiate putty HEP   Altamease Asters, OT 08/31/2023, 5:42 PM

## 2023-08-31 NOTE — Patient Instructions (Signed)
 Upper Body Strengthening Exercises Comments Sit upright, away from the back of your chair. Keep abdominal muscles engaged i.e. pull belly button to spine.  FOR ALL EXERCISES: Repeat 10 Times  Hold 3 Seconds  Complete 1 Set  Perform 2 Times a Day  ELASTIC BAND FLEXION   Place unaffected arm on your leg or hip. With your affected arm, hold elastic band in front of you and pull the band upward towards the ceiling as shown.    ELASTIC BAND HORIZONTAL ABDUCTION - SCAPULAR RETRACTION  Start by holding elastic band in front of your chest with your elbows straight. Then, pull your arms apart and towards the side while squeezing your shoulder blades together. Return to starting position and repeat.             ELASTIC BAND TRICEPS EXTENSION  While seated, hold and fixate one end of an elastic band against your chest. Hold the other end with your opposite hand with your elbow bent and arm by your side.   Start by pulling the band downward so that the elbow goes from a bent position to a straightened position as shown. Return to starting position and repeat.   BICEPS CURL WITH BAND  While sitting in an upright position, put an elastic band underneath your feet (as pictured)  OR underneath your thighs. Grip each end of the band, keep the elbows tucked to the body, and bring hands to shoulders by bending at the elbows.

## 2023-08-31 NOTE — Progress Notes (Unsigned)
 SUBJECTIVE:   CHIEF COMPLAINT / HPI:   HOSPITAL FOLLOW UP Hospital/facility: [facility], followed by inpatient rehab Diagnosis: myositis, neuropathy, antibody positive, multiple vitamin deficiencies - few week h/o multiple falls, neuropathy, progressive weakness and sensory loss found to have multiple vitamin deficiences, improved with aggressive supplementation Procedures/tests:  - low folate, vit D, vit A, vit B1 - MRI brain, spine without acute abnormality - CT chest with multifocal PNA - MRI thigh with edema of L buttock and thigh Consultants: Neurology New medications:  Discharge instructions:   - f/u with Optho to discuss restarting humira - f/u with Dr. Bernetta Brilliant to discuss restarting IVIG - hydrochlorothiazide  discontinued Status: better - a little more independent and moving around a little better.  - doing outpatient OT/PT, going well.  - taking vitamins with good compliance, tolerating well.  - taking BP at home, 118/83 yesterday.  - tramadol  and robaxin  helping with pain - still with some neuropathy - has rollator, cane, walker. - better strength just still with some muscle fatigue. - appetite better - breathing much better, finished antibiotics for PNA - finally got period, LMP 08/28/23.  - no more nausea or vomiting, still taking protonix .    OBJECTIVE:   BP 133/88   Pulse 84   Ht 5\' 9"  (1.753 m)   Wt (!) 326 lb 12.8 oz (148.2 kg)   LMP 06/27/2023 (Approximate) Comment: neg hcg on 07/12/23  SpO2 97%   BMI 48.26 kg/m   Gen: well appearing, in NAD Card: RRR Lungs: CTAB MSK: 5/5 LE strength, in wheelchair. Ext: WWP, no edema   ASSESSMENT/PLAN:   Problem List Items Addressed This Visit       Cardiovascular and Mediastinum   Hypertension   Previous antihypertensive d/ced in hospital. Currently at goal. Continue current therapies.      Relevant Medications   metoprolol  tartrate (LOPRESSOR ) 25 MG tablet     Respiratory   Pneumonia of both lungs due to  infectious organism   Resolved.         Digestive   Persistent nausea & vomiting   Resolved. Consider reduction of PPI given concurrent vitamin deficiencies.         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 Continue current pain regimen and working with PT.      Relevant Orders   Ambulatory referral to Rheumatology     Genitourinary   AKI (acute kidney injury) (HCC)     Other   Anemia   Relevant Medications   folic acid  (FOLVITE ) 1 MG tablet   cyanocobalamin  1000 MCG tablet   Elevated liver enzymes   Relevant Orders   Comprehensive metabolic panel with GFR   Folate deficiency   Gout   Relevant Medications   allopurinol  (ZYLOPRIM ) 100 MG tablet   Lower extremity weakness, bilateral   Vitamin A  deficiency   Relevant Orders   Vitamin A    Vitamin B12 deficiency   Plan to recheck B vitamin levels 1 month after supplementation, towards the end of June.      Vitamin D  deficiency   Relevant Orders   VITAMIN D  25 Hydroxy (Vit-D Deficiency, Fractures)   Other Visit Diagnoses       Thiamine  deficiency,  possible            Kandis Ormond, DO

## 2023-09-01 ENCOUNTER — Ambulatory Visit: Admitting: Family Medicine

## 2023-09-01 ENCOUNTER — Encounter: Payer: Self-pay | Admitting: Family Medicine

## 2023-09-01 VITALS — BP 133/88 | HR 84 | Ht 69.0 in | Wt 326.8 lb

## 2023-09-01 DIAGNOSIS — R1110 Vomiting, unspecified: Secondary | ICD-10-CM

## 2023-09-01 DIAGNOSIS — M609 Myositis, unspecified: Secondary | ICD-10-CM | POA: Diagnosis not present

## 2023-09-01 DIAGNOSIS — E509 Vitamin A deficiency, unspecified: Secondary | ICD-10-CM

## 2023-09-01 DIAGNOSIS — I10 Essential (primary) hypertension: Secondary | ICD-10-CM | POA: Diagnosis not present

## 2023-09-01 DIAGNOSIS — G63 Polyneuropathy in diseases classified elsewhere: Secondary | ICD-10-CM

## 2023-09-01 DIAGNOSIS — E559 Vitamin D deficiency, unspecified: Secondary | ICD-10-CM

## 2023-09-01 DIAGNOSIS — N179 Acute kidney failure, unspecified: Secondary | ICD-10-CM

## 2023-09-01 DIAGNOSIS — R748 Abnormal levels of other serum enzymes: Secondary | ICD-10-CM | POA: Diagnosis not present

## 2023-09-01 DIAGNOSIS — E538 Deficiency of other specified B group vitamins: Secondary | ICD-10-CM | POA: Diagnosis not present

## 2023-09-01 DIAGNOSIS — D649 Anemia, unspecified: Secondary | ICD-10-CM

## 2023-09-01 DIAGNOSIS — M109 Gout, unspecified: Secondary | ICD-10-CM | POA: Diagnosis not present

## 2023-09-01 DIAGNOSIS — E519 Thiamine deficiency, unspecified: Secondary | ICD-10-CM

## 2023-09-01 DIAGNOSIS — E889 Metabolic disorder, unspecified: Secondary | ICD-10-CM | POA: Diagnosis not present

## 2023-09-01 DIAGNOSIS — J189 Pneumonia, unspecified organism: Secondary | ICD-10-CM

## 2023-09-01 DIAGNOSIS — E5111 Dry beriberi: Secondary | ICD-10-CM

## 2023-09-01 DIAGNOSIS — R29898 Other symptoms and signs involving the musculoskeletal system: Secondary | ICD-10-CM

## 2023-09-01 MED ORDER — THIAMINE HCL 100 MG PO TABS: 100.0000 mg | ORAL_TABLET | Freq: Every day | ORAL | 0 refills | Status: AC

## 2023-09-01 MED ORDER — CYANOCOBALAMIN 1000 MCG PO TABS: 1000.0000 ug | ORAL_TABLET | Freq: Every day | ORAL | 0 refills | Status: DC

## 2023-09-01 MED ORDER — ALLOPURINOL 100 MG PO TABS: 100.0000 mg | ORAL_TABLET | Freq: Every day | ORAL | 0 refills | Status: DC

## 2023-09-01 MED ORDER — FOLIC ACID 1 MG PO TABS: 1.0000 mg | ORAL_TABLET | Freq: Every day | ORAL | 0 refills | Status: DC

## 2023-09-01 MED ORDER — PANTOPRAZOLE SODIUM 40 MG PO TBEC: 40.0000 mg | DELAYED_RELEASE_TABLET | Freq: Every day | ORAL | 0 refills | Status: AC

## 2023-09-01 MED ORDER — METOPROLOL TARTRATE 25 MG PO TABS: 12.5000 mg | ORAL_TABLET | Freq: Two times a day (BID) | ORAL | 0 refills | Status: DC

## 2023-09-01 NOTE — Assessment & Plan Note (Signed)
 Improving. Continue current pain regimen and working with PT.

## 2023-09-01 NOTE — Assessment & Plan Note (Signed)
 Plan to recheck B vitamin levels 1 month after supplementation, towards the end of June.

## 2023-09-01 NOTE — Assessment & Plan Note (Signed)
>>  ASSESSMENT AND PLAN FOR MYOSITIS WRITTEN ON 09/01/2023  3:19 PM BY Tharon Bomar M, DO  Improving. Continue current pain regimen and working with PT.

## 2023-09-01 NOTE — Assessment & Plan Note (Signed)
 Resolved

## 2023-09-01 NOTE — Assessment & Plan Note (Signed)
 Resolved. Consider reduction of PPI given concurrent vitamin deficiencies.

## 2023-09-01 NOTE — Assessment & Plan Note (Signed)
 Previous antihypertensive d/ced in hospital. Currently at goal. Continue current therapies.

## 2023-09-01 NOTE — Patient Instructions (Signed)
 It was great to see you!  Our plans for today:  - We are referring you to Rheumatology. Let us  know if you don't hear about an appointment in the next few weeks.  - We sent refills to your pharmacy - Come back in 2-4 weeks for follow up.   We are checking some labs today, we will release these results to your MyChart.  Take care and seek immediate care sooner if you develop any concerns.   Dr. Lema Heinkel

## 2023-09-04 ENCOUNTER — Encounter: Payer: Self-pay | Admitting: Physical Therapy

## 2023-09-04 ENCOUNTER — Ambulatory Visit: Admitting: Physical Therapy

## 2023-09-04 VITALS — BP 144/68 | HR 83

## 2023-09-04 DIAGNOSIS — M25562 Pain in left knee: Secondary | ICD-10-CM | POA: Diagnosis not present

## 2023-09-04 DIAGNOSIS — R2681 Unsteadiness on feet: Secondary | ICD-10-CM | POA: Diagnosis not present

## 2023-09-04 DIAGNOSIS — G63 Polyneuropathy in diseases classified elsewhere: Secondary | ICD-10-CM | POA: Diagnosis not present

## 2023-09-04 DIAGNOSIS — Q998 Other specified chromosome abnormalities: Secondary | ICD-10-CM | POA: Diagnosis not present

## 2023-09-04 DIAGNOSIS — R2689 Other abnormalities of gait and mobility: Secondary | ICD-10-CM | POA: Diagnosis not present

## 2023-09-04 DIAGNOSIS — R29818 Other symptoms and signs involving the nervous system: Secondary | ICD-10-CM | POA: Diagnosis not present

## 2023-09-04 DIAGNOSIS — M6281 Muscle weakness (generalized): Secondary | ICD-10-CM

## 2023-09-04 DIAGNOSIS — R208 Other disturbances of skin sensation: Secondary | ICD-10-CM | POA: Diagnosis not present

## 2023-09-04 DIAGNOSIS — G629 Polyneuropathy, unspecified: Secondary | ICD-10-CM | POA: Diagnosis not present

## 2023-09-04 NOTE — Therapy (Signed)
 OUTPATIENT PHYSICAL THERAPY NEURO TREATMENT   Patient Name: Tiffany Velasquez MRN: [id number] DOB:06/01/1995, 28 y.o., female Today's Date: 09/04/2023   PCP: Kandis Ormond, DO  REFERRING PROVIDER: Sterling Eisenmenger, PA-C     END OF SESSION:  PT End of Session - 09/04/23 1110     Visit Number 2    Number of Visits 17    Date for PT Re-Evaluation 10/28/23    Authorization Type [employer] MEDICARE    PT Start Time 1110   pt late to session   PT Stop Time 1145    PT Time Calculation (min) 35 min    Equipment Utilized During Treatment Gait belt    Activity Tolerance Patient tolerated treatment well    Behavior During Therapy WFL for tasks assessed/performed             Past Medical History:  Diagnosis Date   Asthma    Eczema    Glaucoma    Hypertension    Morbid obesity (HCC)    Transaminitis 07/15/2023   Uveitic glaucoma of both eyes, 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   Past Surgical History:  Procedure Laterality Date   BIOPSY  04/04/2023   Procedure: BIOPSY;  Surgeon: Junie Olds, MD;  Location: [facility];  Service: General;;   CATARACT EXTRACTION     ESOPHAGOGASTRODUODENOSCOPY N/A 04/04/2023   Procedure: ESOPHAGOGASTRODUODENOSCOPY (EGD);  Surgeon: Junie Olds, MD;  Location: [facility];  Service: General;  Laterality: N/A;   PORTA CATH INSERTION     Patient Active Problem List   Diagnosis Date Noted   Adjustment disorder with mixed anxiety and depressed mood 07/24/2023   Myositis 07/21/2023   Myositis associated antibody positive 07/20/2023   Vitamin A  deficiency 07/19/2023   Pneumonia of both lungs due to infectious organism 07/19/2023   Polyneuropathy associated with underlying disease (HCC) 07/19/2023   Myositis of left lower extremity 07/18/2023   Vitamin D  deficiency 07/18/2023   Anemia 07/16/2023   Urinary retention 07/16/2023   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) 07/16/2023   Unintentional weight loss of  10% body weight within 6 months 07/16/2023   Vitamin B12 deficiency 07/16/2023   Peanut  allergy  07/15/2023   Port-A-Cath in place 07/15/2023   Immunosuppression due to drug therapy (HCC) 07/15/2023   Acetazolamide  Therapy 07/15/2023   Highly Elevated C-reactive protein (CRP) 07/15/2023   Folate deficiency 07/15/2023   Elevated sedimentation rate measurement 07/15/2023   Elevated CK 07/15/2023   Weight loss, 150 lbs in 1 year 07/15/2023   Persistent nausea & vomiting 07/15/2023   Opioid Therapy 07/15/2023   Chorioretinal inflammation of both eyes 07/15/2023   mild Glaucoma, steroid induced 07/15/2023   Thiamine  deficiency neuropathy 07/15/2023   Lower extremity weakness, bilateral 07/14/2023   Tachycardia  Multifocal PNA 07/14/2023   Hypokalemia 07/09/2023   Elevated liver enzymes 07/09/2023   AKI (acute kidney injury) (HCC) 07/08/2023   Generalized weakness  Myositis 07/08/2023   Dehydration 07/08/2023   Paresthesia 07/08/2023   Neuropathy 06/05/2023   Gout 05/29/2023   Epigastric abdominal tenderness without rebound tenderness 05/10/2023   Fatigue 05/10/2023   Gastroesophageal reflux disease 11/08/2022   Pain in both feet 07/16/2021   Iritis 07/26/2018   Seasonal and perennial allergic rhinitis 07/26/2018   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   Retinal edema 07/13/2018   Not well controlled moderate persistent asthma 01/16/2017  Intrinsic atopic dermatitis 01/16/2017   Hypertension 06/22/2012   Morbid obesity (HCC) 10/05/2009   Severe eczema 05/25/2006    ONSET DATE: 08/11/2023  REFERRING DIAG: M60.9 (ICD-10-CM) - Myositis 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 Going to a water park this weekend - will be bringing her rollator. Reports that there is a aquatic lift chair that can get her in and out of the pool. No falls, just feeling a little bit of off balance. Pt reports that she started doing a short distance driving with her mom. Going to see a new rheumatologist in [place]. Is late to appt today due to moving slower than normal due to being stiff.   Pt accompanied by: self  PERTINENT HISTORY: HTN, focal chorioretinal inflammation-managed with IVIG monthly, borderline glaucoma and retinal edema, multiple vitamin deficiencies, chronic bilateral foot pain, gout, abnormal immunoglobulins, morbid obesity-BMI 48 was working on weight loss with dietitian in preparation for bariatric surgery, polyneuropathy   She reported multiple falls in 2 weeks and was sent to ED for evaluation. She was found to have low folate, low vitamin D  and low vitamin A  levels, elevated CRP, low B1 less than 6. MRI brain and spine without acute abnormality. 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 No pain, just feeling a little bit of off balance-ness.   Vitals:   09/04/23 1120  BP: (!) 144/68  Pulse: 83     PRECAUTIONS: Fall  WEIGHT BEARING RESTRICTIONS: No  FALLS: Has patient fallen in last 6 months? Yes. Number of falls 6 and before hospitalization  LIVING ENVIRONMENT: Lives with: lives with mom, dad, older brother and dog [name]  Lives in: House/apartment Stairs: Yes: External: 3 steps; on left going up reports goes sideways and feels like she has more control  Has following equipment at home: Single point cane, Walker - 2 wheeled, Walker - 4 wheeled, shower chair, Grab bars, and With [facility] with 3 prong tip  Previously to hospitalization was also living with family.   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 Wants to get more upper body and leg strength   OBJECTIVE:  Note: Objective measures were completed at Evaluation unless otherwise noted.   COGNITION: Overall cognitive status: Within functional limits for tasks assessed   SENSATION: Light touch: Impaired  and impaired bilaterally, esp more proximal in thigh area and medial ankle area  Proprioception: WFL and bilateral ankles into PF/DF  Pt also reports some tingling in her hands. Reports numbness in mid foot to toes   POSTURE: rounded shoulders, forward head, and posterior pelvic tilt  LOWER EXTREMITY MMT:    MMT Right Eval Left Eval  Hip flexion 4- 3+  Hip extension    Hip abduction 4+ 4+  Hip adduction 5 5  Hip internal rotation    Hip external rotation    Knee flexion 4+ 4+  Knee extension 4+ 4+  Ankle dorsiflexion 5 5  Ankle plantarflexion    Ankle inversion    Ankle eversion    (Blank rows = not tested)  BED MOBILITY:  Pt reports being able to get in and out of bed at home with steps and   TRANSFERS: Sit to stand: SBA   Assistive device utilized: None     Stand to sit: SBA  Assistive device utilized: None     Needs BUE support   Attempted to stand up from chair without using hands, but pt unable to perform today from standard chair, pt reliant on BUE support    GAIT: Gait pattern: step through pattern, decreased stride length, and lateral hip instability Distance walked: Clinic distances  Assistive device utilized: SPC with 3 prong tip  Level of assistance: SBA Comments: No unsteadiness noted ambulating short distances in and out of clinic    FUNCTIONAL TESTS:  5 times sit to stand: 26.4 seconds with BUE support                                                                                                                                TREATMENT DATE:   Self-Care:   Vitals:   09/04/23 1120  BP: (!) 144/68  Pulse: 83    Pt reports going to water park next week with family- pt planning on bringing her rollator and will be going in the pool with use of a pool lift chair. Also discussed water shoes and pt  planning on looking into getting some due to her neuropathy  Educated regarding driving that pt reports that Jean Michaelis cleared her to drive. PT looked in pt's discharged summary and per Jean Michaelis it said no driving unless cleared by MD. Pt reports her PCP cleared her to drive, but it is not in her note. Discussed with pt that if she is cleared to drive then it needs to be documented as pt should not be driving unless a physician has cleared her and documented this. Pt verbalized understanding and plans to reach out to her PCP  Assessed vitals and [facility] for therapy  Therapeutic Activity: Gait speed with SPC: 14.7 seconds = 2.23 ft/sec  TUG: 13.4 seconds with [facility]   [facility] PT Assessment - 09/04/23 1126       Standardized Balance Assessment   Standardized Balance Assessment Berg Balance Test      Berg Balance Test   Sit to Stand Able to stand without using hands and stabilize independently     Standing Unsupported Able to stand safely 2 minutes    Sitting with Back Unsupported but Feet Supported on Floor or Stool Able to sit safely and securely 2 minutes    Stand to Sit Sits safely with minimal use of hands    Transfers Able to transfer safely, minor use of hands    Standing Unsupported with Eyes Closed Unable to keep eyes closed 3 seconds but stays steady    Standing Unsupported with Feet Together Able to place feet together independently and stand for 1 minute with supervision    From Standing, Reach Forward with Outstretched Arm Can reach forward >12 cm safely (5")   8"   From Standing Position, Pick up Object from Floor Able to pick up shoe, needs supervision    From Standing Position, Turn to Look Behind Over each Shoulder Looks behind one side only/other side shows less weight shift   more shaky going to R   Turn 360 Degrees Able to turn 360 degrees safely but slowly   5 seconds each side, little shakier going to the L side   Standing Unsupported, Alternately Place Feet on Step/Stool Able to complete 4 steps without aid or supervision    Standing Unsupported, One Foot in Front Able to take small step independently and hold 30 seconds    Standing on One Leg Tries to lift leg/unable to hold 3 seconds but remains standing independently   1-2 seconds standing on RLE   Total Score 40    Berg comment: 40/56              PATIENT EDUCATION: Education details:  See self-care, results of goals, verbally reviewed exercises from inpatient rehab which pt has been performing  Person educated: Patient Education method: Explanation, Demonstration, and Verbal cues Education comprehension: verbalized understanding  HOME EXERCISE PROGRAM: Access Code: [email or web address] URL: [email or web address] Date: 08/15/2023 Prepared by: Annia Kilts   Exercises - Supine Bridge  - 1 x daily - 7 x weekly - 1 sets - 5 reps - Straight Leg Raise  - 1 x daily - 7 x weekly - 1 sets - 5 reps -  Single Leg Bridge  - 1 x daily - 7 x weekly - 1 sets - 5 reps - Supine 90/90 Alternating Toe Touch One Leg at a Time  - 1 x daily - 7 x weekly - 1 sets - 5 reps - Transition from belly onto hands and knees  - 1 x daily -  7 x weekly - 1 sets - 5 reps - Sit to Stand with Armchair  - 1 x daily - 7 x weekly - 1 sets - 5 reps - Standing Marching  - 1 x daily - 7 x weekly - 1 sets - 20 reps  From inpatient rehab, will revise/update as appropriate   GOALS: Goals reviewed with patient? Yes  SHORT TERM GOALS: Target date: 09/26/2023  Pt will be independent with initial HEP for strength, gait, balance in order to build upon functional gains made in therapy Baseline: Goal status: INITIAL  2.  BERG to be assessed with LTG written. Baseline: 40/56 Goal status: MET  3.  Pt will improve gait speed with SPC to at least 2.6 ft/sec in order to demo improved community mobility.   Baseline: Gait speed with SPC: 14.7 seconds = 2.23 ft/sec  Goal status: REVISED  4.  TUG to be assessed with STG/LTG written.  Baseline: LTG written Goal status: MET  5.  Pt will improve 5x sit<>stand to less than or equal to 22 sec to demonstrate improved functional strength and transfer efficiency.  Baseline: 26.4 seconds with BUE support  Goal status: INITIAL   LONG TERM GOALS: Target date: 10/24/2023  Pt will be independent with final HEP for strength, gait, balance in order to build upon functional gains made in therapy Baseline:  Goal status: INITIAL  2.  Pt will improve BERG to at least a 46/56 in order to demo decr fall risk. Baseline: 40/56 Goal status: INITIAL  3.  Pt will improve gait speed with SPC vs. No AD to at least 3.1 ft/sec in order to demo improved community mobility.  Baseline: Gait speed with SPC: 14.7 seconds = 2.23 ft/sec  Goal status: INITIAL  4.  Pt will improve TUG time to 12 seconds or less with no AD in order to demo decrease fall risk.  Baseline: 13.4 seconds with SPC Goal status:  INITIAL  5.   Pt will improve 5x sit<>stand to less than or equal to 18 sec with no UE support to demonstrate improved functional strength and transfer efficiency.  Baseline:  Goal status: INITIAL  6.  Pt will ambulate at least 500' outdoors over unlevel surfaces with LRAD and mod I for improved community mobility.  Baseline:  Goal status: INITIAL  ASSESSMENT:  CLINICAL IMPRESSION: Session limited today due to pt arriving late. Pt ambulates with SPC and lifts it off the ground at times. Initial cues for proper sequencing with cane and putting it on the ground at the same time as LLE. Performed further outcome measures today as did not have time during eval. Assessed BERG with pt scoring a 40/56, indicating a significant fall risk. Pt's gait speed with SPC indicates a limited community ambulator. LTGs updated as appropriate. Plan to add balance tasks to HEP at next session. Will continue per POC.    OBJECTIVE IMPAIRMENTS: Abnormal gait, decreased activity tolerance, decreased balance, decreased endurance, decreased mobility, difficulty walking, decreased strength, impaired flexibility, impaired sensation, postural dysfunction, and pain.   ACTIVITY LIMITATIONS: standing, squatting, stairs, transfers, locomotion level, and caring for others  PARTICIPATION LIMITATIONS: driving, shopping, community activity, occupation, and yard work  PERSONAL FACTORS: Behavior pattern, Past/current experiences, Time since onset of injury/illness/exacerbation, and 3+ comorbidities: HTN, focal chorioretinal inflammation-managed with IVIG monthly, borderline glaucoma and retinal edema, multiple vitamin deficiencies, chronic bilateral foot pain, gout, abnormal immunoglobulins, morbid obesity-BMI 48 was working on weight loss with dietitian in preparation for bariatric surgery, polyneuropathy  are also affecting patient's functional outcome.   REHAB POTENTIAL: Good  CLINICAL DECISION MAKING: Evolving/moderate  complexity  EVALUATION COMPLEXITY: Moderate  PLAN:  PT FREQUENCY: 2x/week  PT DURATION: 8 weeks   PLANNED INTERVENTIONS: 97164- PT Re-evaluation, 97110-Therapeutic exercises, 97530- Therapeutic activity, [id number]- Neuromuscular re-education, 97535- Self Care, 21308- Manual therapy, [phone]- Gait training, [phone]- Aquatic Therapy, Patient/Family education, Balance training, Stair training, Vestibular training, Cognitive remediation, and DME instructions  PLAN FOR NEXT SESSION:  Add balance things to HEP. Work on functional strength, balance - SLS, narrow BOS, EC.    Se[place]ron [place], PT, DPT 09/04/2023, 12:16 PM

## 2023-09-05 ENCOUNTER — Ambulatory Visit: Payer: Self-pay | Admitting: Family Medicine

## 2023-09-05 DIAGNOSIS — H3581 Retinal edema: Secondary | ICD-10-CM | POA: Diagnosis not present

## 2023-09-05 DIAGNOSIS — Z961 Presence of intraocular lens: Secondary | ICD-10-CM | POA: Diagnosis not present

## 2023-09-05 DIAGNOSIS — H30033 Focal chorioretinal inflammation, peripheral, bilateral: Secondary | ICD-10-CM | POA: Diagnosis not present

## 2023-09-05 DIAGNOSIS — H4043X4 Glaucoma secondary to eye inflammation, bilateral, indeterminate stage: Secondary | ICD-10-CM | POA: Diagnosis not present

## 2023-09-05 DIAGNOSIS — H44113 Panuveitis, bilateral: Secondary | ICD-10-CM | POA: Diagnosis not present

## 2023-09-05 DIAGNOSIS — H40043 Steroid responder, bilateral: Secondary | ICD-10-CM | POA: Diagnosis not present

## 2023-09-05 DIAGNOSIS — H209 Unspecified iridocyclitis: Secondary | ICD-10-CM | POA: Diagnosis not present

## 2023-09-05 DIAGNOSIS — Z79899 Other long term (current) drug therapy: Secondary | ICD-10-CM | POA: Diagnosis not present

## 2023-09-06 LAB — CBC
Hematocrit: 38.3 % (ref 34.0–46.6)
Hemoglobin: 11.3 g/dL (ref 11.1–15.9)
MCH: 28.5 pg (ref 26.6–33.0)
MCHC: 29.5 g/dL — ABNORMAL LOW (ref 31.5–35.7)
MCV: 97 fL (ref 79–97)
Platelets: 303 10*3/uL (ref 150–450)
RBC: 3.96 x10E6/uL (ref 3.77–5.28)
RDW: 15.3 % (ref 11.7–15.4)
WBC: 5.6 10*3/uL (ref 3.4–10.8)

## 2023-09-06 LAB — COMPREHENSIVE METABOLIC PANEL WITH GFR
ALT: 6 IU/L (ref 0–32)
AST: 14 IU/L (ref 0–40)
Albumin: 3.9 g/dL — ABNORMAL LOW (ref 4.0–5.0)
Alkaline Phosphatase: 58 IU/L (ref 44–121)
BUN/Creatinine Ratio: 15 (ref 9–23)
BUN: 12 mg/dL (ref 6–20)
Bilirubin Total: 0.3 mg/dL (ref 0.0–1.2)
CO2: 14 mmol/L — ABNORMAL LOW (ref 20–29)
Calcium: 9.6 mg/dL (ref 8.7–10.2)
Chloride: 108 mmol/L — ABNORMAL HIGH (ref 96–106)
Creatinine, Ser: 0.82 mg/dL (ref 0.57–1.00)
Globulin, Total: 3.4 g/dL (ref 1.5–4.5)
Glucose: 73 mg/dL (ref 70–99)
Potassium: 4.2 mmol/L (ref 3.5–5.2)
Sodium: 138 mmol/L (ref 134–144)
Total Protein: 7.3 g/dL (ref 6.0–8.5)
eGFR: 101 mL/min/{1.73_m2} (ref >59)

## 2023-09-06 LAB — VITAMIN A: Vitamin A: 54.3 ug/dL (ref 18.9–57.3)

## 2023-09-06 LAB — VITAMIN D 25 HYDROXY (VIT D DEFICIENCY, FRACTURES): Vit D, 25-Hydroxy: 17.2 ng/mL — ABNORMAL LOW (ref 30.0–100.0)

## 2023-09-06 LAB — MAGNESIUM: Magnesium: 2.1 mg/dL (ref 1.6–2.3)

## 2023-09-06 MED ORDER — BUSPIRONE HCL 5 MG PO TABS: 5.0000 mg | ORAL_TABLET | Freq: Three times a day (TID) | ORAL | 1 refills | Status: DC

## 2023-09-06 MED ORDER — VITAMIN D (ERGOCALCIFEROL) 1.25 MG (50000 UNIT) PO CAPS: 50000.0000 [IU] | ORAL_CAPSULE | ORAL | 0 refills | Status: DC

## 2023-09-08 ENCOUNTER — Ambulatory Visit (INDEPENDENT_AMBULATORY_CARE_PROVIDER_SITE_OTHER): Admitting: Neurology

## 2023-09-08 ENCOUNTER — Ambulatory Visit

## 2023-09-08 VITALS — BP 133/83 | HR 93

## 2023-09-08 DIAGNOSIS — R208 Other disturbances of skin sensation: Secondary | ICD-10-CM | POA: Diagnosis not present

## 2023-09-08 DIAGNOSIS — R29818 Other symptoms and signs involving the nervous system: Secondary | ICD-10-CM | POA: Diagnosis not present

## 2023-09-08 DIAGNOSIS — R2689 Other abnormalities of gait and mobility: Secondary | ICD-10-CM | POA: Diagnosis not present

## 2023-09-08 DIAGNOSIS — M25562 Pain in left knee: Secondary | ICD-10-CM | POA: Diagnosis not present

## 2023-09-08 DIAGNOSIS — E889 Metabolic disorder, unspecified: Secondary | ICD-10-CM

## 2023-09-08 DIAGNOSIS — M6281 Muscle weakness (generalized): Secondary | ICD-10-CM

## 2023-09-08 DIAGNOSIS — G729 Myopathy, unspecified: Secondary | ICD-10-CM

## 2023-09-08 DIAGNOSIS — R2681 Unsteadiness on feet: Secondary | ICD-10-CM | POA: Diagnosis not present

## 2023-09-08 DIAGNOSIS — Q998 Other specified chromosome abnormalities: Secondary | ICD-10-CM | POA: Diagnosis not present

## 2023-09-08 DIAGNOSIS — R202 Paresthesia of skin: Secondary | ICD-10-CM

## 2023-09-08 DIAGNOSIS — G629 Polyneuropathy, unspecified: Secondary | ICD-10-CM | POA: Diagnosis not present

## 2023-09-08 DIAGNOSIS — G63 Polyneuropathy in diseases classified elsewhere: Secondary | ICD-10-CM | POA: Diagnosis not present

## 2023-09-08 NOTE — Therapy (Signed)
 OUTPATIENT PHYSICAL THERAPY NEURO TREATMENT   Patient Name: Tiffany Velasquez MRN: [id number] DOB:10-Sep-1995, 28 y.o., female Today's Date: 09/08/2023   PCP: Kandis Ormond, DO  REFERRING PROVIDER: Sterling Eisenmenger, PA-C     END OF SESSION:  PT End of Session - 09/08/23 1103     Visit Number 3    Number of Visits 17    Date for PT Re-Evaluation 10/28/23    Authorization Type [employer] MEDICARE    PT Start Time 1101    PT Stop Time 1140    PT Time Calculation (min) 39 min    Activity Tolerance Patient tolerated treatment well    Behavior During Therapy [facility] for tasks assessed/performed          Past Medical History:  Diagnosis Date   Asthma    Eczema    Glaucoma    Hypertension    Morbid obesity (HCC)    Transaminitis 07/15/2023   Uveitic glaucoma of both eyes, 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   Past Surgical History:  Procedure Laterality Date   BIOPSY  04/04/2023   Procedure: BIOPSY;  Surgeon: Junie Olds, MD;  Location: [facility];  Service: General;;   CATARACT EXTRACTION     ESOPHAGOGASTRODUODENOSCOPY N/A 04/04/2023   Procedure: ESOPHAGOGASTRODUODENOSCOPY (EGD);  Surgeon: Junie Olds, MD;  Location: [facility];  Service: General;  Laterality: N/A;   PORTA CATH INSERTION     Patient Active Problem List   Diagnosis Date Noted   Adjustment disorder with mixed anxiety and depressed mood 07/24/2023   Myositis 07/21/2023   Myositis associated antibody positive 07/20/2023   Vitamin A  deficiency 07/19/2023   Pneumonia of both lungs due to infectious organism 07/19/2023   Polyneuropathy associated with underlying disease (HCC) 07/19/2023   Myositis of left lower extremity 07/18/2023   Vitamin D  deficiency 07/18/2023   Anemia 07/16/2023   Urinary retention 07/16/2023   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) 07/16/2023   Unintentional weight loss of 10% body weight within 6 months 07/16/2023   Vitamin B12 deficiency  07/16/2023   Peanut  allergy  07/15/2023   Port-A-Cath in place 07/15/2023   Immunosuppression due to drug therapy (HCC) 07/15/2023   Acetazolamide  Therapy 07/15/2023   Highly Elevated C-reactive protein (CRP) 07/15/2023   Folate deficiency 07/15/2023   Elevated sedimentation rate measurement 07/15/2023   Elevated CK 07/15/2023   Weight loss, 150 lbs in 1 year 07/15/2023   Persistent nausea & vomiting 07/15/2023   Opioid Therapy 07/15/2023   Chorioretinal inflammation of both eyes 07/15/2023   mild Glaucoma, steroid induced 07/15/2023   Thiamine  deficiency neuropathy 07/15/2023   Lower extremity weakness, bilateral 07/14/2023   Tachycardia  Multifocal PNA 07/14/2023   Hypokalemia 07/09/2023   Elevated liver enzymes 07/09/2023   AKI (acute kidney injury) (HCC) 07/08/2023   Generalized weakness  Myositis 07/08/2023   Dehydration 07/08/2023   Paresthesia 07/08/2023   Neuropathy 06/05/2023   Gout 05/29/2023   Epigastric abdominal tenderness without rebound tenderness 05/10/2023   Fatigue 05/10/2023   Gastroesophageal reflux disease 11/08/2022   Pain in both feet 07/16/2021   Iritis 07/26/2018   Seasonal and perennial allergic rhinitis 07/26/2018   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   Retinal edema 07/13/2018   Not well controlled moderate persistent asthma 01/16/2017   Intrinsic atopic dermatitis 01/16/2017   Hypertension 06/22/2012   Morbid obesity (HCC) 10/05/2009   Severe  eczema 05/25/2006    ONSET DATE: 08/11/2023  REFERRING DIAG: M60.9 (ICD-10-CM) - Myositis 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 Unable to wear socks/shoes today and so is wearing slides. Denies falls.   Pt accompanied by: self  PERTINENT HISTORY: HTN, focal chorioretinal inflammation-managed with IVIG monthly, borderline glaucoma and retinal edema, multiple vitamin deficiencies, chronic bilateral foot pain, gout, abnormal immunoglobulins, morbid obesity-BMI 48 was working on weight loss with dietitian in preparation for bariatric surgery, polyneuropathy   She reported multiple falls in 2 weeks and was sent to ED for evaluation. She was found to have low folate, low vitamin D  and low vitamin A  levels, elevated CRP, low B1 less than 6. MRI brain and spine without acute abnormality. 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 No pain, just feeling a little bit of off balance-ness.   Vitals:   09/08/23 1111  BP: 133/83  Pulse: 93    PRECAUTIONS: Fall   PATIENT GOALS: Wants to be more independent, feels like she is still teetering some when she is walking. Wants to get more upper body and leg strength  TREATMENT:  Self-Care:   Vitals:   09/08/23 1111  BP: 133/83  Pulse: 93   Theract: -sit <> stand + zoom ball 3x6  -blaze pods random color tap 3x 1'30 with prolonged seated rest breaks between   Therex:  -seated trunk twist + db punch (2lb)    PATIENT EDUCATION: Education details:  continue HEP, activity pacing Person educated: Patient Education method: Explanation, Demonstration, and Verbal cues Education comprehension: verbalized understanding  HOME EXERCISE PROGRAM: Access Code: [email or web address] URL: [email or web address] Date: 08/15/2023 Prepared by: Annia Kilts   Exercises - Supine Bridge  - 1 x daily - 7 x weekly - 1 sets - 5 reps - Straight Leg Raise  - 1 x daily - 7 x weekly - 1 sets - 5 reps - Single Leg Bridge  - 1 x daily - 7 x weekly - 1 sets - 5 reps - Supine 90/90 Alternating Toe Touch One Leg at a Time  - 1 x daily - 7 x weekly - 1 sets - 5 reps - Transition from belly onto hands and knees  - 1 x daily - 7 x weekly - 1 sets - 5 reps - Sit to Stand with Armchair  - 1 x daily - 7 x weekly - 1 sets - 5 reps - Standing Marching  - 1 x daily - 7 x weekly - 1 sets - 20 reps  From inpatient rehab, will revise/update as appropriate   GOALS: Goals reviewed with patient? Yes  SHORT TERM GOALS: Target date: 09/26/2023  Pt will be independent with initial HEP for strength, gait, balance in order to build upon functional gains made in therapy Baseline: Goal status: INITIAL  2.  BERG to be assessed with LTG written. Baseline: 40/56 Goal status: MET  3.  Pt will improve gait speed with SPC to at least 2.6 ft/sec in order to demo improved community mobility.   Baseline: Gait speed with SPC: 14.7 seconds = 2.23 ft/sec  Goal status: REVISED  4.  TUG to be assessed with STG/LTG written.  Baseline: LTG written Goal status: MET  5.  Pt will improve 5x sit<>stand to less than or equal to 22 sec to demonstrate improved functional strength  and transfer efficiency.  Baseline: 26.4 seconds with BUE support  Goal status: INITIAL   LONG TERM GOALS: Target date: 10/24/2023  Pt will be independent with final HEP for strength, gait, balance in order to build upon functional gains made in therapy Baseline:  Goal status: INITIAL  2.  Pt will improve BERG to at least a 46/56 in order to demo decr fall risk. Baseline: 40/56 Goal status: INITIAL  3.  Pt will improve gait speed with SPC vs. No AD to at least 3.1 ft/sec in order to demo improved community mobility.  Baseline: Gait speed with SPC: 14.7 seconds = 2.23 ft/sec  Goal status: INITIAL  4.  Pt will improve TUG time to 12 seconds or less with no AD in order to demo decrease fall risk.  Baseline: 13.4 seconds with SPC Goal status: INITIAL  5.   Pt will improve 5x sit<>stand to less than or equal to 18 sec with no UE support to demonstrate improved functional strength and transfer efficiency.  Baseline:  Goal status: INITIAL  6.  Pt will ambulate at least 500' outdoors over unlevel surfaces with LRAD and mod I for improved community mobility.  Baseline:  Goal status: INITIAL  ASSESSMENT:  CLINICAL IMPRESSION: Patient seen for skilled PT session with  emphasis on functional strengthening and progressing to SLS practice. Limited by onset of B LE pain, possibly due to neuropathy. She did require multiple prolonged seated rest breaks, but generally tolerated tx well. Will continue to benefit from functional LE strengthening and posture retraining. Continue POC.    OBJECTIVE IMPAIRMENTS: Abnormal gait, decreased activity tolerance, decreased balance, decreased endurance, decreased mobility, difficulty walking, decreased strength, impaired flexibility, impaired sensation, postural dysfunction, and pain.   ACTIVITY LIMITATIONS: standing, squatting, stairs, transfers, locomotion level, and caring for others  PARTICIPATION LIMITATIONS: driving, shopping, community activity,  occupation, and yard work  PERSONAL FACTORS: Behavior pattern, Past/current experiences, Time since onset of injury/illness/exacerbation, and 3+ comorbidities: HTN, focal chorioretinal inflammation-managed with IVIG monthly, borderline glaucoma and retinal edema, multiple vitamin deficiencies, chronic bilateral foot pain, gout, abnormal immunoglobulins, morbid obesity-BMI 48 was working on weight loss with dietitian in preparation for bariatric surgery, polyneuropathy  are also affecting patient's functional outcome.   REHAB POTENTIAL: Good  CLINICAL DECISION MAKING: Evolving/moderate complexity  EVALUATION COMPLEXITY: Moderate  PLAN:  PT FREQUENCY: 2x/week  PT DURATION: 8 weeks   PLANNED INTERVENTIONS: 97164- PT Re-evaluation, 97110-Therapeutic exercises, 97530- Therapeutic activity, [id number]- Neuromuscular re-education, 97535- Self Care, 25956- Manual therapy, [phone]- Gait training, [phone]- Aquatic Therapy, Patient/Family education, Balance training, Stair training, Vestibular training, Cognitive remediation, and DME instructions  PLAN FOR NEXT SESSION:  Add balance things to HEP. Work on functional strength, balance - SLS, narrow BOS, EC.    Rebecca Campus, PT Rebecca Campus, PT, DPT, CBIS  09/08/2023, 12:16 PM

## 2023-09-08 NOTE — Procedures (Unsigned)
 [facility]  [address] [place], [address]  [place], [place] [address] Tel: [phone] Fax: [phone] Test Date:  09/08/2023  Patient: Tiffany Velasquez DOB: 19-Jan-1996 Physician: Reyna Cava, DO  Sex: Female Height: 5' 9 Ref Phys: Reyna Cava, DO  ID#: [id number]   Technician:    History: This is a 28 year old female referred for evaluation of bilateral feet paresthesias, pain, and leg weakness.  NCV & EMG Findings: Extensive electrodiagnostic testing of the right lower extremity and additional studies of the left shows:  Bilateral sural and superficial peroneal sensory responses are absent. Bilateral peroneal motor responses are within normal limits.  Bilateral tibial motor responses show reduced amplitude (R7.0, R6.9 mV). Tibial H reflex is prolonged on the right, and absent on the left. Sparse small amplitude and polyphasic motor units are seen involving bilateral rectus femoris and right tibialis anterior muscles.  There is a variable motor unit recruitment in the muscles below the knee, without associated change in motor unit configuration.  Fibrillation potentials were not seen in any of the tested muscles.  Due to body habitus, proximal muscles were unable to be tested.  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   ___________________________ Reyna Cava, DO    Nerve Conduction Studies   Stim Site NR Peak (ms) Norm Peak (ms) O-P Amp (V) Norm O-P Amp  Left Sup Peroneal Anti Sensory (Ant Lat Mall)  32 C  12 cm *NR  <4.4  >6  Right Sup Peroneal Anti Sensory (Ant Lat Mall)  32 C  12 cm *NR  <4.4  >6  Left Sural Anti Sensory (Lat Mall)  32 C  Calf *NR  <4.4  >6  Right Sural Anti Sensory (Lat Mall)  32 C  Calf *NR  <4.4  >6     Stim Site NR Onset  (ms) Norm Onset (ms) O-P Amp (mV) Norm O-P Amp Site1 Site2 Delta-0 (ms) Dist (cm) Vel (m/s) Norm Vel (m/s)  Left Peroneal Motor (Ext Dig Brev)  32 C  Ankle    3.1 <5.5 3.1 >3 B Fib Ankle 9.6 41.0 43 >41  B Fib    12.7  2.5  Poplt B Fib 1.9 10.0 53 >41  Poplt    14.6  2.5         Right Peroneal Motor (Ext Dig Brev)  32 C  Ankle    3.4 <5.5 3.2 >3 B Fib Ankle 9.3 41.0 44 >41  B Fib    12.7  3.0  Poplt B Fib 1.7 9.0 53 >41  Poplt    14.4  2.7         Left Tibial Motor (Abd Hall Brev)  32 C  Ankle    3.7 <5.8 *6.9 >8 Knee Ankle 9.6 44.0 46 >41  Knee    13.3  6.3         Right Tibial Motor (Abd Hall Brev)  32 C  Ankle    3.6 <5.8 *7.0 >8 Knee Ankle 10.3 44.0 43 >41  Knee    13.9  3.8          Electromyography   Side Muscle Ins.Act Fibs Fasc Recrt Amp Dur Poly Activation Comment  Right AntTibialis Nml Nml Nml *Early *1- *1+ *1+ Nml N/A  Right Gastroc Nml Nml Nml *1- Nml Nml Nml Mod N/A  Right Flex Dig Long Nml  Nml Nml *1- Nml Nml Nml Mod N/A  Right RectFemoris Nml Nml Nml *Early *1- *1+ *1+ Nml N/A  Right BicepsFemS Nml Nml Nml Nml Nml Nml Nml Mod N/A  Left AntTibialis Nml Nml Nml Nml Nml Nml Nml Mod N/A  Left Gastroc Nml Nml Nml *1- Nml Nml Nml Mod N/A  Left Flex Dig Long Nml Nml Nml *1- Nml Nml Nml Mod N/A  Left RectFemoris Nml Nml Nml *Early *1- *1+ *1+ Nml N/A      Waveforms:

## 2023-09-12 ENCOUNTER — Ambulatory Visit

## 2023-09-12 ENCOUNTER — Other Ambulatory Visit (HOSPITAL_COMMUNITY): Payer: Self-pay

## 2023-09-12 ENCOUNTER — Other Ambulatory Visit: Payer: Self-pay | Admitting: Family Medicine

## 2023-09-12 ENCOUNTER — Ambulatory Visit: Admitting: Occupational Therapy

## 2023-09-12 VITALS — BP 119/89 | HR 89

## 2023-09-12 DIAGNOSIS — R2681 Unsteadiness on feet: Secondary | ICD-10-CM

## 2023-09-12 DIAGNOSIS — R29818 Other symptoms and signs involving the nervous system: Secondary | ICD-10-CM

## 2023-09-12 DIAGNOSIS — G629 Polyneuropathy, unspecified: Secondary | ICD-10-CM

## 2023-09-12 DIAGNOSIS — M6281 Muscle weakness (generalized): Secondary | ICD-10-CM

## 2023-09-12 DIAGNOSIS — G63 Polyneuropathy in diseases classified elsewhere: Secondary | ICD-10-CM | POA: Diagnosis not present

## 2023-09-12 DIAGNOSIS — M25562 Pain in left knee: Secondary | ICD-10-CM | POA: Diagnosis not present

## 2023-09-12 DIAGNOSIS — R208 Other disturbances of skin sensation: Secondary | ICD-10-CM | POA: Diagnosis not present

## 2023-09-12 DIAGNOSIS — R2689 Other abnormalities of gait and mobility: Secondary | ICD-10-CM | POA: Diagnosis not present

## 2023-09-12 DIAGNOSIS — Q998 Other specified chromosome abnormalities: Secondary | ICD-10-CM | POA: Diagnosis not present

## 2023-09-12 NOTE — Therapy (Unsigned)
 OUTPATIENT OCCUPATIONAL THERAPY NEURO TREATMENT  Patient Name: Tiffany Velasquez MRN: [id number] DOB:[birth year], 28 y.o., female Today's Date: 09/12/2023  PCP: Kandis Ormond, DO  REFERRING PROVIDER: Sterling Eisenmenger, PA-C  END OF SESSION:  OT End of Session - 09/12/23 1234     Visit Number 2    Number of Visits 7    Date for OT Re-Evaluation 10/06/23    Authorization Type [employer]    OT Start Time 1234    OT Stop Time 1315    OT Time Calculation (min) 41 min    Activity Tolerance Patient tolerated treatment well    Behavior During Therapy Anxious          Past Medical History:  Diagnosis Date   Asthma    Eczema    Glaucoma    Hypertension    Morbid obesity (HCC)    Transaminitis 07/15/2023   Uveitic glaucoma of both eyes, 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   Past Surgical History:  Procedure Laterality Date   BIOPSY  04/04/2023   Procedure: BIOPSY;  Surgeon: Junie Olds, MD;  Location: [facility];  Service: General;;   CATARACT EXTRACTION     ESOPHAGOGASTRODUODENOSCOPY N/A 04/04/2023   Procedure: ESOPHAGOGASTRODUODENOSCOPY (EGD);  Surgeon: Junie Olds, MD;  Location: [facility];  Service: General;  Laterality: N/A;   PORTA CATH INSERTION     Patient Active Problem List   Diagnosis Date Noted   Adjustment disorder with mixed anxiety and depressed mood 07/24/2023   Myositis 07/21/2023   Myositis associated antibody positive 07/20/2023   Vitamin A  deficiency 07/19/2023   Pneumonia of both lungs due to infectious organism 07/19/2023   Polyneuropathy associated with underlying disease (HCC) 07/19/2023   Myositis of left lower extremity 07/18/2023   Vitamin D  deficiency 07/18/2023   Anemia 07/16/2023   Urinary retention 07/16/2023   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) 07/16/2023   Unintentional weight loss of 10% body weight within 6 months 07/16/2023   Vitamin B12 deficiency 07/16/2023   Peanut  allergy   07/15/2023   Port-A-Cath in place 07/15/2023   Immunosuppression due to drug therapy (HCC) 07/15/2023   Acetazolamide  Therapy 07/15/2023   Highly Elevated C-reactive protein (CRP) 07/15/2023   Folate deficiency 07/15/2023   Elevated sedimentation rate measurement 07/15/2023   Elevated CK 07/15/2023   Weight loss, 150 lbs in 1 year 07/15/2023   Persistent nausea & vomiting 07/15/2023   Opioid Therapy 07/15/2023   Chorioretinal inflammation of both eyes 07/15/2023   mild Glaucoma, steroid induced 07/15/2023   Thiamine  deficiency neuropathy 07/15/2023   Lower extremity weakness, bilateral 07/14/2023   Tachycardia  Multifocal PNA 07/14/2023   Hypokalemia 07/09/2023   Elevated liver enzymes 07/09/2023   AKI (acute kidney injury) (HCC) 07/08/2023   Generalized weakness  Myositis 07/08/2023   Dehydration 07/08/2023   Paresthesia 07/08/2023   Neuropathy 06/05/2023   Gout 05/29/2023   Epigastric abdominal tenderness without rebound tenderness 05/10/2023   Fatigue 05/10/2023   Gastroesophageal reflux disease 11/08/2022   Pain in both feet 07/16/2021   Iritis 07/26/2018   Seasonal and perennial allergic rhinitis 07/26/2018   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   Retinal edema 07/13/2018   Not well controlled moderate persistent asthma 01/16/2017   Intrinsic atopic dermatitis 01/16/2017   Hypertension 06/22/2012   Morbid obesity (HCC) 10/05/2009   Severe eczema 05/25/2006    ONSET DATE: 08/15/2023 (  Date of referral)  REFERRING DIAG: M60.9 (ICD-10-CM) - Myositis, unspecified 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 Her family has really been helpful in her recovery process, but she is still unable to do things at her prior level.  Pt accompanied by: self  PERTINENT HISTORY: HTN, focal chorioretinal inflammation-managed with IVIG monthly, borderline glaucoma and retinal edema, multiple vitamin deficiencies, chronic bilateral foot pain, gout, abnormal immunoglobulins, morbid obesity-BMI 48 was working on weight loss with dietitian in preparation for bariatric surgery, polyneuropathy    She reported multiple falls in 2 weeks and was sent to ED for evaluation. She was found to have low folate, low vitamin D  and low vitamin A  levels, elevated CRP, low B1 less than 6. MRI brain and spine without acute abnormality. 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 Yes: NPRS scale: 5/10 Pain location: general Pain description: neuropathy with joint pain Aggravating factors: walking Relieving factors: rest  FALLS: Has patient fallen in last 6 months? Yes. Number of falls 6  LIVING ENVIRONMENT: Lives with: lives with mom, dad, older brother and dog [name]  Lives in: House/apartment Stairs: Yes: External: 3 steps; on left going up reports goes sideways and feels like she has more control  Has  following equipment at home: Single point cane, Walker - 2 wheeled, Walker - 4 wheeled, shower chair, Grab bars, and With [facility] with 3 prong tip  Previously to hospitalization was also living with family.    PLOF: Independent and Vocation/Vocational requirements: was a 3rd grade teacher; was driving; student at [employer]    PATIENT GOALS: Wants to be more independent, feels like she is still teetering some when she is walking. Wants to get more upper body and leg strength   OBJECTIVE:  Note: Objective measures were completed at Evaluation unless otherwise noted.  HAND DOMINANCE: Right  ADLs: Overall ADLs: mod I  IADLs: Shopping: min A Meal Prep: dependent Community mobility: dependent Medication management: setup Financial management: mod I Typing: mod I  MOBILITY STATUS: Needs Assist: depending on day, pt requiring use of AD and supervision  ACTIVITY TOLERANCE: Activity tolerance: good  FUNCTIONAL OUTCOME MEASURES: PSFS: 3.7   Total score = sum of the activity scores/number of activities Minimum detectable change (90%CI) for average score = 2 points Minimum detectable change (90%CI) for single activity score = 3 points  UPPER EXTREMITY ROM:   BUE: WNL  UPPER EXTREMITY MMT:     RUE: WFL poor endurance LUE: WFL  HAND FUNCTION: Grip strength: Right: 55.5 lbs; Left: 40.3 lbs  COORDINATION: 9 Hole Peg test: Right: 24 sec; Left: 23 sec  SENSATION: Paresthesias  reported B - reports EMG/nerve conduction study coming up 09/21/2023  EDEMA: none reported or observed  MUSCLE TONE:WFL  COGNITION: Overall cognitive status: Within functional limits for tasks assessed  VISION: Subjective report: no changes  Baseline vision: Wears glasses for distance only Visual history: cataracts  VISION ASSESSMENT: WFL  PERCEPTION: WFL  PRAXIS: WFL  OBSERVATIONS: Pt arrives with fast food in hand. Slow, altered gait, though no LOB; fairly well-kept; anxious                                                                                                                            TREATMENT :    OT initiated RUE and LUE green Thera-Band HEP including shoulder flexion, horizontal abd/add, bicep curl, and tricep extension to promote strengthening of affected extremity and overall endurance as noted in pt instructions.    PATIENT EDUCATION: Education details: OT Role and POC; BUE green theraband HEP Person educated: Patient Education method: Explanation, Demonstration, Verbal cues, and Handouts Education comprehension: verbalized understanding, verbal cues required, and needs further education  HOME EXERCISE PROGRAM: 08/31/2023: BUE green theraband HEP  GOALS:  SHORT TERM GOALS: Target date: 09/28/2023   Patient will demonstrate initial B UE HEP with 25% verbal cues or less for proper execution.  Baseline: Goal status: MET  2.  Pt will independently recall the 5 main sensory precautions (cold, heat, sharp, chemical, and heavy) as needed to prevent injury/harm secondary to impairments.   Baseline:  Goal status: IN PROGRESS  3.  Patient will independently verbalize at least 3 energy conservation principles in relation to ADLs to increase functional independence.  Baseline:  Goal status: IN PROGRESS  LONG TERM GOALS: Target date: 10/06/2023  Patient will demonstrate updated B UE HEP with visual handouts only for proper execution. Baseline:  Goal status: IN PROGRESS  2.  Patient will report at least two-point increase in average PSFS score or at least three-point increase in a single activity score indicating functionally significant improvement given minimum detectable change.  Baseline: 3.7 total score (See above for individual activity scores) Goal status: INITIAL   ASSESSMENT:  CLINICAL IMPRESSION: Patient ***  PERFORMANCE DEFICITS: in functional skills including ADLs, IADLs, strength, endurance, and UE functional use.   IMPAIRMENTS: are limiting patient  from ADLs, IADLs, education, work, play, leisure, and social participation.   CO-MORBIDITIES: may have co-morbidities  that affects occupational performance. Patient will benefit from skilled OT to address above impairments and improve overall function.  REHAB POTENTIAL: Good  PLAN:  OT FREQUENCY: 1x/week  OT DURATION: 6 weeks  PLANNED INTERVENTIONS: 97168 OT Re-evaluation, 97535 self care/ADL training, 16109 therapeutic exercise, 97530 therapeutic activity, 97112 neuromuscular re-education, 97140 manual therapy, 97035 ultrasound, 97018 paraffin, 60454 fluidotherapy, 97010 moist heat, functional mobility training, energy conservation, coping strategies training, patient/family education, and DME and/or AE instructions  RECOMMENDED OTHER SERVICES: N/A for this visit  CONSULTED AND AGREED WITH PLAN OF CARE: Patient  PLAN FOR NEXT SESSION: review EC strategies  and update goal; review theraband HEP; initiate putty HEP   Altamease Asters, OT 09/12/2023, 6:35 PM

## 2023-09-12 NOTE — Patient Instructions (Addendum)
 Safety considerations for loss of sensation:   Look at affected hand when using it!   Do NOT use affected arm for anything: sharp, hot, breakable, or too heavy  Always check temperature of water (for showering, washing dishes, etc) with UNaffected arm/extremity  Consider travel mugs w/ lids to transport hot liquids/coffee  Consider alternative options and/or adaptive equipment to make things safer (ex: hand chopper or cut resistant glove for chopping vegetables)   Avoid cold temperatures as well (wear glove in cold temperatures, get ice w/ unaffected extremity)  AVOID handling chemicals and machinery      1    Energy Conservation  What is Energy Conservation?  After being in the hospital, it is normal to feel tired and weak. You may also feel short of breath and have less energy to do the activities you are used to doing at home. Learning how to conserve your energy helps you build up your strength to take part in your daily activities and other things you enjoy doing.  When you learn to conserve energy, you also reduce strain on your heart, fatigue, shortness of breath and stress related pain.  Learning to conserve your energy is all about finding a good balance between work, rest and leisure in order to decrease the amount of energy demand on your body.   Remember and Practice the 4 Ps  1. Prioritize  Decide what needs to be done today and what can be done at a later date or time. For example, going to a doctor's appointment would take priority over dusting the living room.  When you have more than one thing to do, begin with the most important to make sure it gets done.   2. Plan  Plan your activities first to avoid extra trips. Gather the supplies and  equipment you need before doing the job. For example, get your  garden supplies and tools ready before you start to plant flowers.  Plan to alternate heavy and light tasks.  Plan activities throughout the week to avoid doing  too many activities in one day. Put a schedule on the refrigerator to remind you and others who is doing what.  Plan to get a good rest each night.  Use family and friends or pay for help to complete tasks you may struggle with or that require too much energy.   3. Pace  Maintain a slow and steady pace. Never rush.  2   3. Pace (continued)  Rest often. Rest before you feel tired.  Avoid holding your breath. Practice breathing slow and steady.  Use pursed lip breathing. Breathe in through your nose for a count of 2 and out from your mouth for a count of 4. This is like blowing out a candle on a cake.  Remember that you may have to ask for help to do some tasks and that is okay.  Listen to your body and know your limits.   4. Position  Too much bending and reaching can cause fatigue and shortness of breath. Use a reacher, sock aid, long handled shoe horn and/or  elastic shoelaces. Avoid bending and reaching too much.  Always maintain a nice upright posture when sitting and  standing. This helps you get more oxygen into your lungs and  around your body to work better.  Sit when you can. Sitting supports your body so you can focus  on your breathing and activities while conserving your energy.  Sitting reduces energy use by 25%.  Energy Conservation Tips  Dressing and Hygiene  Sit when you can.  Organize and lay out clothing the night before.  Begin dressing your lower half first as this uses more energy.  Avoid bending and reaching. Instead, use a reacher, sock aid  or long handled shoe horn or lift your legs up onto the bed or chair.  Dry off with terry cloth robe. You use less energy than drying off with a towel.  If you have a weaker limb or limbs, it is easier to dress the weaker limb first. It is easier to undress your strong limb first.  Wear clothes that are easy to put on and take off. For example, use clothes and shoes with velcro instead of small buttons, clasps or laces.   3    Avoid using scented products such as hair products and lotions. These can irritate your lungs and cause shortness of breath for you and others around you. Many people are allergic to scents. These types of products are not allowed in the hospital.  Be cautious when bathing. Use warm, not hot water. This helps eliminate shortness of breath from a buildup of steam and condensation.  Use the bathroom equipment suggested by your Occupational Therapist. For example using a bath bench, bath stool, grab bars or a raised toilet seat can make bathing and toileting easier and safer.   Shopping  Bring a prepared list of things you need to buy.  Organize your shopping list by aisle or section of the  store.  Transport items in a buggy or shopping cart rather than  carrying them in a basket.  Load and carry grocery bags that are only half full or shop with someone who can help pack and carry bags.  Avoid going out during rush hour when stores and streets are crowded.  Consider using a delivery service.   Housework  Divide activities and do them throughout the week. Balance light with heavy tasks.  Make one side of the bed at a time. Sit to change pillow cases and unfold linen.  Avoid spray cleaners that may irritate your lungs.  Clean the bathtub by sitting or kneeling.  Clean one whole room at a time instead of going back and forth between rooms to do each job.  Consider asking for help from family members or  hiring a cleaning service or housekeeper.  After washing dishes, allow them to air dry.  Have work in front of you rather than at your side.  Slide rather than lift objects.  Use long handled dustpans and cleaning sponges to decrease the need for bending.  4    Make a weekly plan for major jobs such as laundry, cleaning and  changing sheets on beds. Do one job each day.  Keep a trash can in every room to avoid too much walking.  Buy more than one of each item you use around the  house.  For example, keep sink cleaner in the bathroom and kitchen.  Keep a vacuum on each level of your home.   Cooking  Cook and bake in steps to reduce energy use.  Gather all ingredients and utensils before starting.  Plan ahead with meal preparation.  Make large meals and freeze in servings for later use.  Use lightweight cookware and dishes to conserve energy.  Use paper plates and cups to eliminate dishwashing.  Use electric appliances such as can openers, blenders, food processors and dishwasher to conserve energy.  Consider buying easy  to prepare or frozen meals, or using a meal delivery service.   Key Points:  Prioritize activities of the day. Do heavier tasks when you have more energy.  Plan your days' and weeks' activities. Set up your work area so you do not have to move around a lot looking for items to complete the task. Plan rest times.  Pace yourself. Do not try to complete the whole task in one session. Break it into smaller, easy to do steps. A good guide to follow is to take 10 minutes each hour to rest. Do not rush.  Position and Posture are important. Sit to work when you can to use 25% less energy. Sit and stand as upright as you can. Practice deep breathing exercises while you work to maintain your breathing rate and stay relaxed.  Use assistive devices when recommended to save energy and make it more comfortable and easy taking care of yourself.   Remember.  The most important energy conservation tip is to listen to your body.  Stop and rest BEFORE you get tired. Plan rest times. Rest often.   PD 8278 (Rev 01-2012) File: peyles

## 2023-09-12 NOTE — Therapy (Signed)
 OUTPATIENT PHYSICAL THERAPY NEURO TREATMENT   Patient Name: Tiffany Velasquez MRN: [id number] DOB:29-Jun-1995, 28 y.o., female Today's Date: 09/12/2023   PCP: Kandis Ormond, DO  REFERRING PROVIDER: Sterling Eisenmenger, PA-C     END OF SESSION:  PT End of Session - 09/12/23 1152     Visit Number 4    Number of Visits 17    Date for PT Re-Evaluation 10/28/23    Authorization Type [employer] MEDICARE    PT Start Time 1151   patient late   PT Stop Time 1229    PT Time Calculation (min) 38 min    Equipment Utilized During Treatment Gait belt    Activity Tolerance Patient tolerated treatment well    Behavior During Therapy WFL for tasks assessed/performed          Past Medical History:  Diagnosis Date   Asthma    Eczema    Glaucoma    Hypertension    Morbid obesity (HCC)    Transaminitis 07/15/2023   Uveitic glaucoma of both eyes, 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   Past Surgical History:  Procedure Laterality Date   BIOPSY  04/04/2023   Procedure: BIOPSY;  Surgeon: Junie Olds, MD;  Location: [facility];  Service: General;;   CATARACT EXTRACTION     ESOPHAGOGASTRODUODENOSCOPY N/A 04/04/2023   Procedure: ESOPHAGOGASTRODUODENOSCOPY (EGD);  Surgeon: Junie Olds, MD;  Location: [facility];  Service: General;  Laterality: N/A;   PORTA CATH INSERTION     Patient Active Problem List   Diagnosis Date Noted   Adjustment disorder with mixed anxiety and depressed mood 07/24/2023   Myositis 07/21/2023   Myositis associated antibody positive 07/20/2023   Vitamin A  deficiency 07/19/2023   Pneumonia of both lungs due to infectious organism 07/19/2023   Polyneuropathy associated with underlying disease (HCC) 07/19/2023   Myositis of left lower extremity 07/18/2023   Vitamin D  deficiency 07/18/2023   Anemia 07/16/2023   Urinary retention 07/16/2023   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) 07/16/2023   Unintentional weight loss of 10% body  weight within 6 months 07/16/2023   Vitamin B12 deficiency 07/16/2023   Peanut  allergy  07/15/2023   Port-A-Cath in place 07/15/2023   Immunosuppression due to drug therapy (HCC) 07/15/2023   Acetazolamide  Therapy 07/15/2023   Highly Elevated C-reactive protein (CRP) 07/15/2023   Folate deficiency 07/15/2023   Elevated sedimentation rate measurement 07/15/2023   Elevated CK 07/15/2023   Weight loss, 150 lbs in 1 year 07/15/2023   Persistent nausea & vomiting 07/15/2023   Opioid Therapy 07/15/2023   Chorioretinal inflammation of both eyes 07/15/2023   mild Glaucoma, steroid induced 07/15/2023   Thiamine  deficiency neuropathy 07/15/2023   Lower extremity weakness, bilateral 07/14/2023   Tachycardia  Multifocal PNA 07/14/2023   Hypokalemia 07/09/2023   Elevated liver enzymes 07/09/2023   AKI (acute kidney injury) (HCC) 07/08/2023   Generalized weakness  Myositis 07/08/2023   Dehydration 07/08/2023   Paresthesia 07/08/2023   Neuropathy 06/05/2023   Gout 05/29/2023   Epigastric abdominal tenderness without rebound tenderness 05/10/2023   Fatigue 05/10/2023   Gastroesophageal reflux disease 11/08/2022   Pain in both feet 07/16/2021   Iritis 07/26/2018   Seasonal and perennial allergic rhinitis 07/26/2018   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   Retinal edema 07/13/2018   Not well controlled moderate persistent asthma 01/16/2017   Intrinsic atopic dermatitis 01/16/2017  Hypertension 06/22/2012   Morbid obesity (HCC) 10/05/2009   Severe eczema 05/25/2006    ONSET DATE: 08/11/2023  REFERRING DIAG: M60.9 (ICD-10-CM) - Myositis 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 Did have her EMG yesterday- waiting on results. Has more pain in her feet today than legs. Denies falls. Currently wearing Crocs without the heel strap.   Pt accompanied by: self  PERTINENT HISTORY: HTN, focal chorioretinal inflammation-managed with IVIG monthly, borderline glaucoma and retinal edema, multiple vitamin deficiencies, chronic bilateral foot pain, gout, abnormal immunoglobulins, morbid obesity-BMI 48 was working on weight loss with dietitian in preparation for bariatric surgery, polyneuropathy   She reported multiple falls in 2 weeks and was sent to ED for evaluation. She was found to have low folate, low vitamin D  and low vitamin A  levels, elevated CRP, low B1 less than 6. MRI brain and spine without acute abnormality. 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 No pain, just feeling a little bit of off balance-ness.   Vitals:   09/12/23 1159  BP: 119/89  Pulse: 89     PRECAUTIONS: Fall   PATIENT GOALS: Wants to be more independent, feels like she is still teetering some when she is walking. Wants to get more upper body and leg strength  TREATMENT:  Self-Care:   Vitals:   09/12/23 1159  BP: 119/89  Pulse: 89   Theract -seated towel scrunch B feet for greater intrinsic foot strength and maintenance of balance -standing tolerance: placing squigz on mirror   -carry through to mini squats moving squigz to bottom of mirror  -seated modified UE D2 with 2lb wrist weights   -progressed to standing   PATIENT EDUCATION: Education details:  continue HEP, activity pacing Person educated: Patient Education method: Explanation, Demonstration, and Verbal cues Education comprehension: verbalized understanding  HOME EXERCISE PROGRAM: Access Code: [email or web address] URL: [email or web address] Date: 08/15/2023 Prepared by: Annia Kilts   Exercises - Supine Bridge  - 1 x daily - 7 x weekly - 1 sets - 5 reps - Straight Leg Raise  - 1 x daily - 7 x weekly - 1 sets - 5 reps - Single Leg Bridge  - 1 x daily - 7 x weekly - 1 sets - 5 reps - Supine 90/90 Alternating Toe Touch One Leg at a Time  - 1 x daily - 7 x weekly - 1 sets - 5 reps - Transition from belly onto hands and knees  - 1 x daily - 7 x weekly - 1 sets - 5 reps - Sit to Stand with Armchair  - 1 x daily - 7 x weekly - 1 sets - 5 reps - Standing Marching  - 1 x daily - 7 x weekly - 1 sets - 20 reps  From inpatient rehab, will revise/update as appropriate   GOALS: Goals reviewed with patient? Yes  SHORT TERM GOALS: Target date: 09/26/2023  Pt will be independent with initial HEP for strength, gait, balance in order to build upon functional gains made in therapy Baseline: Goal status: INITIAL  2.  BERG to be assessed with LTG written. Baseline: 40/56 Goal status: MET  3.  Pt will improve gait speed with SPC to at least 2.6 ft/sec in order to demo improved community mobility.   Baseline: Gait speed with SPC: 14.7 seconds = 2.23 ft/sec  Goal status:  REVISED  4.  TUG to be assessed with STG/LTG written.  Baseline: LTG written Goal status: MET  5.  Pt will improve 5x sit<>stand to less than or equal to 22 sec to demonstrate improved functional strength and transfer efficiency.  Baseline: 26.4 seconds with BUE support  Goal status: INITIAL   LONG TERM GOALS: Target date: 10/24/2023  Pt will be independent with final HEP for strength, gait, balance in order to build upon functional gains made in therapy Baseline:  Goal status: INITIAL  2.  Pt will improve BERG to at least a 46/56 in order to demo decr fall risk. Baseline: 40/56 Goal status: INITIAL  3.  Pt will improve gait speed with SPC vs. No AD to at least 3.1 ft/sec in order to demo improved community mobility.  Baseline: Gait speed with SPC: 14.7 seconds = 2.23 ft/sec  Goal status: INITIAL  4.  Pt will improve TUG time to 12 seconds or less with no AD in order to demo decrease fall risk.  Baseline: 13.4 seconds with SPC Goal status: INITIAL  5.   Pt will improve 5x sit<>stand to less than or equal to 18 sec with no UE support to demonstrate improved functional strength and transfer efficiency.  Baseline:  Goal status: INITIAL  6.  Pt will ambulate at least 500' outdoors over unlevel surfaces with LRAD and mod I for improved community mobility.  Baseline:  Goal  status: INITIAL  ASSESSMENT:  CLINICAL IMPRESSION: Patient seen for skilled PT session with emphasis on progressing standing tolerance and functional strength. Initial exercises completed targeting intrinsic foot strength in attempts to improve static standing balance. Tolerance to prolonged standing improving as well. Continue POC.    OBJECTIVE IMPAIRMENTS: Abnormal gait, decreased activity tolerance, decreased balance, decreased endurance, decreased mobility, difficulty walking, decreased strength, impaired flexibility, impaired sensation, postural dysfunction, and pain.   ACTIVITY LIMITATIONS: standing,  squatting, stairs, transfers, locomotion level, and caring for others  PARTICIPATION LIMITATIONS: driving, shopping, community activity, occupation, and yard work  PERSONAL FACTORS: Behavior pattern, Past/current experiences, Time since onset of injury/illness/exacerbation, and 3+ comorbidities: HTN, focal chorioretinal inflammation-managed with IVIG monthly, borderline glaucoma and retinal edema, multiple vitamin deficiencies, chronic bilateral foot pain, gout, abnormal immunoglobulins, morbid obesity-BMI 48 was working on weight loss with dietitian in preparation for bariatric surgery, polyneuropathy  are also affecting patient's functional outcome.   REHAB POTENTIAL: Good  CLINICAL DECISION MAKING: Evolving/moderate complexity  EVALUATION COMPLEXITY: Moderate  PLAN:  PT FREQUENCY: 2x/week  PT DURATION: 8 weeks   PLANNED INTERVENTIONS: 97164- PT Re-evaluation, 97110-Therapeutic exercises, 97530- Therapeutic activity, [id number]- Neuromuscular re-education, 97535- Self Care, 16109- Manual therapy, [phone]- Gait training, [phone]- Aquatic Therapy, Patient/Family education, Balance training, Stair training, Vestibular training, Cognitive remediation, and DME instructions  PLAN FOR NEXT SESSION:  Add balance things to HEP. Work on functional strength, balance - SLS, narrow BOS, EC.    Rebecca Campus, PT Rebecca Campus, PT, DPT, CBIS  09/12/2023, 1:28 PM

## 2023-09-13 ENCOUNTER — Other Ambulatory Visit: Payer: Self-pay

## 2023-09-13 MED ORDER — DULOXETINE HCL 30 MG PO CPEP: 30.0000 mg | ORAL_CAPSULE | Freq: Two times a day (BID) | ORAL | 1 refills | Status: DC

## 2023-09-13 NOTE — Patient Instructions (Signed)
 Visit Information  Thank you for taking time to visit with me today. Please don't hesitate to contact me if I can be of assistance to you before our next scheduled appointment.  Your next care management appointment is by telephone on 09/27/23 at 1 PM  Please call the care guide team at [phone] if you need to cancel, schedule, or reschedule an appointment.   Please call the Suicide and Crisis Lifeline: 988 call 1-800-273-TALK (toll free, 24 hour hotline) if you are experiencing a Mental Health or Behavioral Health Crisis or need someone to talk to.  Theodora Fish, RN MSN [place]  [facility] Population Health RN Care Manager Direct Dial: [phone]  Fax: [phone]

## 2023-09-13 NOTE — Patient Outreach (Signed)
 Complex Care Management   Visit Note  09/13/2023  Name:  Tiffany Velasquez MRN: [id number] DOB: 08-28-1995  Situation: Referral received for Complex Care Management related to myositis I obtained verbal consent from Patient.  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 Her f/u to review results is on 09/26/23.  HEENT HEENT Symptoms Reported: Not assessed HEENT Comment: Most recent eye doctor appointment 09/05/23    Cardiovascular      Respiratory      Endocrine Patient reports the following symptoms related to hypoglycemia or hyperglycemia : Not assessed Is patient diabetic?: No    Gastrointestinal Gastrointestinal Symptoms Reported: Not assessed      Genitourinary Genitourinary Symptoms Reported: Not assessed    Integumentary Integumentary Symptoms Reported: Not assessed    Musculoskeletal Musculoskelatal Symptoms Reviewed: Weakness, Other Other Musculoskeletal Symptoms: Patient continues to work with PT and OT. She does feel that her strength is improving. Soreness primarily in legs/thigh but not as bad per patient. Musculoskeletal Conditions: Other Other Musculoskeletal Conditions: Myositis Musculoskeletal Management  Strategies: Medical device, Adequate rest, Exercise Musculoskeletal Comment: Patient still has not heard from rheumatology in [facility]. She plans to call today to inquire about referral. Falls in the past year?: Yes Number of falls in past year: 2 or more Was there an injury with Fall?: Yes Fall Risk Category Calculator: 3 Patient Fall Risk Level: High Fall Risk Patient at Risk for Falls Due to: History of fall(s) Fall risk Follow up: Falls evaluation completed  Psychosocial Psychosocial Symptoms Reported: Not assessed            08/30/2023   10:41 AM  Depression screen PHQ 2/9  Decreased Interest 0  Down, Depressed, Hopeless 0  PHQ - 2 Score 0    There were no vitals filed for this visit.  Medications Reviewed Today     Reviewed by Valaria Garland, RN (Registered Nurse) on 09/13/23 at 1007  Med List Status: <None>   Medication Order Taking? Sig Documenting Provider Last Dose Status Informant  acetaminophen  (TYLENOL ) 325 MG tablet [id number] No Take 1-2 tablets (325-650 mg total) by mouth every 4 (four) hours as needed for mild pain (pain score 1-3). Zelda Hickman, PA-C Taking Active   acetaZOLAMIDE  (DIAMOX ) 250 MG tablet [id number] No Take 500 mg by mouth 2 (two) times daily. [provider] Taking Active Self, Pharmacy Records           Med Note Silvestre Drum, TYELISHA L   Fri Jul 14, 2023  9:00 PM) LF: 07/12/23 for a 30ds  Adalimumab 40 MG/0.4ML PNKT [id number] No Inject 40 mg into the skin every 14 (fourteen) days. [provider] Taking Active Self, Pharmacy Records  albuterol  (PROVENTIL ) (2.5 MG/3ML) 0.083% nebulizer solution [id number] No Take 3 mLs (2.5 mg total) by nebulization every  6 (six) hours as needed for wheezing or shortness of breath.  Patient taking differently: Take 2.5 mg by nebulization every 4 (four) hours as needed for wheezing or shortness of breath.   Rochester Chuck, MD Taking Active Self, Pharmacy Records  albuterol  (VENTOLIN   HFA) 108 [id number] Base) MCG/ACT inhaler [id number] No Inhale 2 puffs every 4-6 hours as needed for cough, wheeze, tightness in chest, or shortness of breath Ambs, Jeanmarie Millet, FNP Taking Active Self, Pharmacy Records           Med Note (WHITE, TONIA S   Sat Jul 08, 2023  1:05 AM)    allopurinol  (ZYLOPRIM ) 100 MG tablet [id number]  Take 1 tablet (100 mg total) by mouth daily. Rumball, Alison M, DO  Active   azelastine  (ASTELIN ) 0.1 % nasal spray [id number] No USE 2 SPRAYS IN EACH NOSTRIL TWICE DAILY AS NEEDED FOR RUNNY NOSE/DRAINAGE DOWN THROAT  Patient taking differently: 2 sprays 2 (two) times daily. USE 2 SPRAYS IN EACH NOSTRIL TWICE DAILY AS NEEDED FOR RUNNY NOSE/DRAINAGE DOWN THROAT   Rochester Chuck, MD Taking Active Self, Pharmacy Records  bimatoprost (LUMIGAN) 0.01 % SOLN [id number] No Place 1 drop into the right eye at bedtime. [provider] Taking Active Self, Pharmacy Records           Med Note Silvestre Drum, TYELISHA L   Fri Jul 14, 2023  9:27 PM) LF: 05/25/23 for a 50ds  brimonidine  (ALPHAGAN ) 0.2 % ophthalmic solution [id number] No Place 1 drop into both eyes 3 (three) times daily. [provider] Taking Active Self, Pharmacy Records           Med Note Silvestre Drum, Myrtle Atta   Fri Jul 14, 2023  9:25 PM) LF: 06/28/23 for a 50ds  budesonide -formoterol  (SYMBICORT ) 160-4.5 MCG/ACT inhaler [id number] No Inhale 2 puffs into the lungs 2 (two) times daily. Zelda Hickman, PA-C Taking Active   busPIRone  (BUSPAR ) 5 MG tablet [id number]  Take 1 tablet (5 mg total) by mouth 3 (three) times daily. Rumball, Alison M, DO  Active   cyanocobalamin  (VITAMIN B12) 1000 MCG/ML injection [id number] No Inject 1 mL (1,000 mcg total) into the muscle every 30 (thirty) days. Zelda Hickman, PA-C Taking Active   cyanocobalamin  1000 MCG tablet [id number]  Take 1 tablet (1,000 mcg total) by mouth daily. Rumball, Alison M, DO  Active   dorzolamide -timolol  (COSOPT ) 2-0.5 % ophthalmic solution [id number] No Place 1  drop into both eyes 2 (two) times daily. [provider] Taking Active Self, Pharmacy Records           Med Note Silvestre Drum, TYELISHA L   Fri Jul 14, 2023  9:25 PM) LF: 07/02/23 for a 50ds  DULoxetine  (CYMBALTA ) 30 MG capsule [id number] No Take 1 capsule (30 mg total) by mouth 2 (two) times daily. Zelda Hickman, PA-C Taking Active   Ensure Max Protein (ENSURE MAX PROTEIN) LIQD [id number] No Take 330 mLs (11 oz total) by mouth 2 (two) times daily. Clem Currier, DO Taking Active   famotidine  (PEPCID ) 40 MG tablet [id number] No Take 1 tablet (40 mg total) by mouth at bedtime. Edmonia Gottron, PA-C Taking Active Self, Pharmacy Records  fluticasone  (FLONASE ) 50 MCG/ACT nasal spray [id number] No Place 1 spray into both nostrils daily. Ardie Kras, FNP Taking Active Self, Pharmacy Records           Med Note (LEE, NICOLE   Fri Jul 14, 2023 10:00 PM) Only uses when the Astelin  doesn't work  folic acid  (FOLVITE ) 1 MG tablet [id number]  Take 1 tablet (1 mg total) by mouth daily. Rumball, Alison M, DO  Active   gabapentin  (NEURONTIN ) 300 MG capsule [id number] No Take 2 capsules (600 mg total) by mouth 2 (two) times daily AND 3 capsules (900 mg total) at bedtime. Love, Pamela S, PA-C Taking Active   GAMUNEX-C 20 GM/200ML SOLN [id number] No  [provider] Taking Active Self, Pharmacy Records           Med Note [facility], ADRIENNE B   Mon Jul 17, 2023  3:03 PM) Rph confirmed w/ infusion pharmacy - next session due 07/15/23  Heparin  Na, Pork, Lock Flsh PF (BD HEPARIN  POSIFLUSH) 100 UNIT/ML SOLN [id number] No Inject 100 Units into the vein daily as needed (Given by nurse at home). [provider] Taking Active            Med Note (LEE, NICOLE   Fri Jul 14, 2023 11:14 PM) Unable to find guidance for dosage.  Patient states injection/flush is given by her nurse at home.     lidocaine  (LMX) 4 % cream [id number] No Apply topically 3 (three) times daily. Zelda Hickman, PA-C Taking Active    magnesium  oxide (MAG-OX) 400 (240 Mg) MG tablet [id number] No Take 1 tablet (400 mg total) by mouth 2 (two) times daily. Zelda Hickman, PA-C Taking Active   melatonin 3 MG TABS tablet [id number] No Take 1 tablet (3 mg total) by mouth at bedtime as needed. Zelda Hickman, PA-C Taking Active   methocarbamol  (ROBAXIN ) 500 MG tablet [id number] No Take 1 tablet (500 mg total) by mouth every 8 (eight) hours as needed for muscle spasms. Zelda Hickman, PA-C Taking Active   metoprolol  tartrate (LOPRESSOR ) 25 MG tablet [id number]  Take 0.5 tablets (12.5 mg total) by mouth 2 (two) times daily. Rumball, Alison M, DO  Active   montelukast  (SINGULAIR ) 10 MG tablet [id number] No Take 1 tablet (10 mg total) by mouth at bedtime. Rochester Chuck, MD Taking Active Self, Pharmacy Records  Multiple Vitamin (MULTIVITAMIN WITH MINERALS) TABS tablet [id number] No Take 1 tablet by mouth daily. Clem Currier, DO Taking Active   pantoprazole  (PROTONIX ) 40 MG tablet [id number]  Take 1 tablet (40 mg total) by mouth daily at 6 (six) AM. Kandis Ormond, DO  Active   polycarbophil (FIBERCON) 625 MG tablet [id number] No Take 1 tablet (625 mg total) by mouth daily. Zelda Hickman, PA-C Taking Active   polyethylene glycol powder (GLYCOLAX /MIRALAX ) 17 GM/SCOOP powder [id number] No Take 17 g by mouth daily.  Patient taking differently: Take 17 g by mouth in the morning and at bedtime.   Wilhemena Harbour, MD Taking Active Self, Pharmacy Records           Med Note (Aylen Rambert P   Wed Aug 30, 2023 10:31 AM) Patient taking as needed  potassium chloride  (KLOR-CON  M) 10 MEQ tablet [id number] No Take 3 tablets (30 mEq total) by mouth 2 (two) times daily. Zelda Hickman, PA-C Taking Active   potassium chloride  (KLOR-CON ) 10 MEQ tablet [id number]  TAKE 3 TABLETS BY MOUTH TWICE DAILY Rumball, Alison M, DO  Active   RETIN-A  MICRO 0.04 % gel [id number] No Apply 1 application  topically daily as needed. [provider] Taking Active  Self, Pharmacy Records           Med Note (LEE, NICOLE   Fri Jul 14, 2023 10:51 PM) Rcvd OTC  RHOPRESSA  0.02 % SOLN  [id number] No Place 1 drop into the right eye at bedtime. [provider] Taking Active Self, Pharmacy Records           Med Note Silvestre Drum, Myrtle Atta   Fri Jul 14, 2023  9:27 PM) LF: 06/09/23 for a 50ds  sucralfate  (CARAFATE ) 1 GM/10ML suspension [id number] No Take 10 mLs (1 g total) by mouth 2 (two) times daily. Zelda Hickman, PA-C Taking Active   thiamine  (VITAMIN B1) 100 MG tablet [id number]  Take 1 tablet (100 mg total) by mouth daily. Rumball, Alison M, DO  Active   traMADol  (ULTRAM ) 50 MG tablet [id number] No Take 1 tablet (50 mg total) by mouth every 12 (twelve) hours as needed for severe pain (pain score 7-10). Jodi Munroe, NP Taking Active   vitamin A  3 MG (10000 UNITS) capsule [id number] No Take 1 capsule (10,000 Units total) by mouth daily. Zelda Hickman, PA-C Taking Active   Vitamin D , Ergocalciferol , (DRISDOL ) 1.25 MG (50000 UNIT) CAPS capsule [id number]  Take 1 capsule (50,000 Units total) by mouth every 7 (seven) days. Rumball, Alison M, DO  Active             Recommendation:   PCP Follow-up Specialty provider follow-up PT/OT, neurology, rheumatology Continue Current Plan of Care  Follow Up Plan:   Telephone follow up appointment date/time:  09/27/23 at 1 PM  Theodora Fish, RN MSN [place]  [facility] RN Care Manager Direct Dial: [phone]  Fax: [phone]

## 2023-09-14 ENCOUNTER — Ambulatory Visit (INDEPENDENT_AMBULATORY_CARE_PROVIDER_SITE_OTHER): Payer: Self-pay

## 2023-09-14 ENCOUNTER — Other Ambulatory Visit (HOSPITAL_COMMUNITY): Payer: Self-pay

## 2023-09-14 DIAGNOSIS — J309 Allergic rhinitis, unspecified: Secondary | ICD-10-CM | POA: Diagnosis not present

## 2023-09-15 ENCOUNTER — Ambulatory Visit: Admitting: Physical Therapy

## 2023-09-15 ENCOUNTER — Encounter: Payer: Self-pay | Admitting: Physical Therapy

## 2023-09-15 VITALS — BP 120/85 | HR 70

## 2023-09-15 DIAGNOSIS — Z79899 Other long term (current) drug therapy: Secondary | ICD-10-CM | POA: Diagnosis not present

## 2023-09-15 DIAGNOSIS — Q998 Other specified chromosome abnormalities: Secondary | ICD-10-CM | POA: Diagnosis not present

## 2023-09-15 DIAGNOSIS — M25562 Pain in left knee: Secondary | ICD-10-CM

## 2023-09-15 DIAGNOSIS — R2681 Unsteadiness on feet: Secondary | ICD-10-CM | POA: Diagnosis not present

## 2023-09-15 DIAGNOSIS — R208 Other disturbances of skin sensation: Secondary | ICD-10-CM | POA: Diagnosis not present

## 2023-09-15 DIAGNOSIS — G63 Polyneuropathy in diseases classified elsewhere: Secondary | ICD-10-CM | POA: Diagnosis not present

## 2023-09-15 DIAGNOSIS — G629 Polyneuropathy, unspecified: Secondary | ICD-10-CM

## 2023-09-15 DIAGNOSIS — H30033 Focal chorioretinal inflammation, peripheral, bilateral: Secondary | ICD-10-CM | POA: Diagnosis not present

## 2023-09-15 DIAGNOSIS — R2689 Other abnormalities of gait and mobility: Secondary | ICD-10-CM | POA: Diagnosis not present

## 2023-09-15 DIAGNOSIS — M6281 Muscle weakness (generalized): Secondary | ICD-10-CM

## 2023-09-15 DIAGNOSIS — R29818 Other symptoms and signs involving the nervous system: Secondary | ICD-10-CM | POA: Diagnosis not present

## 2023-09-15 NOTE — Therapy (Unsigned)
 OUTPATIENT PHYSICAL THERAPY NEURO TREATMENT   Patient Name: Tiffany Velasquez MRN: [id number] DOB:[birth year], 28 y.o., female Today's Date: 09/15/2023   PCP: Kandis Ormond, DO  REFERRING PROVIDER: Sterling Eisenmenger, PA-C     END OF SESSION:  PT End of Session - 09/15/23 1110     Visit Number 5    Number of Visits 17    Date for PT Re-Evaluation 10/28/23    Authorization Type [employer] MEDICARE    PT Start Time 1110    PT Stop Time 1150    PT Time Calculation (min) 40 min    Equipment Utilized During Treatment Gait belt    Activity Tolerance Patient tolerated treatment well    Behavior During Therapy WFL for tasks assessed/performed          Past Medical History:  Diagnosis Date   Asthma    Eczema    Glaucoma    Hypertension    Morbid obesity (HCC)    Transaminitis 07/15/2023   Uveitic glaucoma of both eyes, 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   Past Surgical History:  Procedure Laterality Date   BIOPSY  04/04/2023   Procedure: BIOPSY;  Surgeon: Junie Olds, MD;  Location: [facility];  Service: General;;   CATARACT EXTRACTION     ESOPHAGOGASTRODUODENOSCOPY N/A 04/04/2023   Procedure: ESOPHAGOGASTRODUODENOSCOPY (EGD);  Surgeon: Junie Olds, MD;  Location: [facility];  Service: General;  Laterality: N/A;   PORTA CATH INSERTION     Patient Active Problem List   Diagnosis Date Noted   Adjustment disorder with mixed anxiety and depressed mood 07/24/2023   Myositis 07/21/2023   Myositis associated antibody positive 07/20/2023   Vitamin A  deficiency 07/19/2023   Pneumonia of both lungs due to infectious organism 07/19/2023   Polyneuropathy associated with underlying disease (HCC) 07/19/2023   Myositis of left lower extremity 07/18/2023   Vitamin D  deficiency 07/18/2023   Anemia 07/16/2023   Urinary retention 07/16/2023   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) 07/16/2023   Unintentional weight loss of 10% body weight within 6  months 07/16/2023   Vitamin B12 deficiency 07/16/2023   Peanut  allergy  07/15/2023   Port-A-Cath in place 07/15/2023   Immunosuppression due to drug therapy (HCC) 07/15/2023   Acetazolamide  Therapy 07/15/2023   Highly Elevated C-reactive protein (CRP) 07/15/2023   Folate deficiency 07/15/2023   Elevated sedimentation rate measurement 07/15/2023   Elevated CK 07/15/2023   Weight loss, 150 lbs in 1 year 07/15/2023   Persistent nausea & vomiting 07/15/2023   Opioid Therapy 07/15/2023   Chorioretinal inflammation of both eyes 07/15/2023   mild Glaucoma, steroid induced 07/15/2023   Thiamine  deficiency neuropathy 07/15/2023   Lower extremity weakness, bilateral 07/14/2023   Tachycardia  Multifocal PNA 07/14/2023   Hypokalemia 07/09/2023   Elevated liver enzymes 07/09/2023   AKI (acute kidney injury) (HCC) 07/08/2023   Generalized weakness  Myositis 07/08/2023   Dehydration 07/08/2023   Paresthesia 07/08/2023   Neuropathy 06/05/2023   Gout 05/29/2023   Epigastric abdominal tenderness without rebound tenderness 05/10/2023   Fatigue 05/10/2023   Gastroesophageal reflux disease 11/08/2022   Pain in both feet 07/16/2021   Iritis 07/26/2018   Seasonal and perennial allergic rhinitis 07/26/2018   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   Retinal edema 07/13/2018   Not well controlled moderate persistent asthma 01/16/2017   Intrinsic atopic dermatitis 01/16/2017   Hypertension 06/22/2012  Morbid obesity (HCC) 10/05/2009   Severe eczema 05/25/2006    ONSET DATE: 08/11/2023  REFERRING DIAG: M60.9 (ICD-10-CM) - Myositis 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 G63 (ICD-10-CM) - Polyneuropathy in diseases classified elsewhere    THERAPY DIAG:  Muscle weakness (generalized)  Neuropathy  Left knee pain, unspecified  chronicity  Other abnormalities of gait and mobility  Unsteadiness on feet  Other symptoms and signs involving the nervous system  Rationale for Evaluation and Treatment: Rehabilitation  SUBJECTIVE:                                                                                                                                                                                             SUBJECTIVE STATEMENT: Pt presents to PT treatment session using 4-prong SPC and currently wearing Crocs in sports mode. Pt reports she is pain free this morning. Pt has noticed her pain has become more intermittent vs. constant. Relieving factors for feet and bilat knee pain (NPRS: 2/10; dull, achy): rest and lidocaine  cream. Patient reports doing well. Denies falls/close calls. Going to see her PCP on Monday to see about driving again.   Pt accompanied by: self  PERTINENT HISTORY: HTN, focal chorioretinal inflammation-managed with IVIG monthly, borderline glaucoma and retinal edema, multiple vitamin deficiencies, chronic bilateral foot pain, gout, abnormal immunoglobulins, morbid obesity-BMI 48 was working on weight loss with dietitian in preparation for bariatric surgery, polyneuropathy   She reported multiple falls in 2 weeks and was sent to ED for evaluation. She was found to have low folate, low vitamin D  and low vitamin A  levels, elevated CRP, low B1 less than 6. MRI brain and spine without acute abnormality. 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 No.   PRECAUTIONS: Fall   PATIENT GOALS: Wants to be more independent, feels like she is still teetering some when she is walking. Wants to get more upper body and leg strength  TREATMENT:  Self-Care / Patient Education:   Vitals:   09/15/23 1118  BP: 120/85  Pulse: 70  Sitting (pre-exercise) Education on energy conservation and deep breathing techniques  Therapeutic Activity:  Circuit-style training -  Round One: 3x 115' overground ambulation with CGA - no AD  RPE: 6/10 8 reps of STS to mat table (raised) RPE: 7.5/10 Pt became SOB so PT administered ~1 min rest break  10 reps of step ups on stairs, tapping feet to different colored dots RPE: 8/10 PT administered ~2 min rest break Round Two: 2x 115' overground ambulation with CGA - no AD RPE: 7.5/10 Verbal cues required for hip and knee flexion with good carryover 6 reps of STS to mat table (raised) RPE: 9/10  PATIENT EDUCATION: Education details:  continue HEP, activity pacing Person educated: Patient Education method: Explanation, Demonstration, and Verbal cues Education comprehension: verbalized understanding  HOME EXERCISE PROGRAM: Access Code: [email or web address] URL: [email or web address] Date: 08/15/2023 Prepared by: Annia Kilts   Exercises - Supine Bridge  - 1 x daily - 7 x weekly - 1 sets - 5 reps - Straight Leg Raise  - 1 x daily - 7 x weekly - 1 sets - 5 reps - Single Leg Bridge  - 1 x daily - 7 x weekly - 1 sets - 5 reps - Supine 90/90 Alternating Toe Touch One Leg at a Time  - 1 x daily - 7 x weekly - 1 sets - 5 reps - Transition from belly onto hands and knees  - 1 x daily - 7 x weekly - 1 sets - 5 reps - Sit to Stand with Armchair  - 1 x daily - 7 x weekly - 1 sets - 5 reps - Standing Marching  - 1 x daily - 7 x weekly - 1  sets - 20 reps  From inpatient rehab, will revise/update as appropriate   GOALS: Goals reviewed with patient? Yes  SHORT TERM GOALS: Target date: 09/26/2023  Pt will be independent with initial HEP for strength, gait, balance in order to build upon functional gains made in therapy Baseline: Goal status: INITIAL  2.  BERG to be assessed with LTG written. Baseline: 40/56 Goal status: MET  3.  Pt will improve gait speed with SPC to at least 2.6 ft/sec in order to demo improved community mobility.   Baseline: Gait speed with SPC: 14.7 seconds = 2.23 ft/sec  Goal status: REVISED  4.  TUG to be assessed with STG/LTG written.  Baseline: LTG written Goal status: MET  5.  Pt will improve 5x sit<>stand to less than or equal to 22 sec to demonstrate improved functional strength and transfer efficiency.  Baseline: 26.4 seconds with BUE support  Goal status: INITIAL   LONG TERM GOALS: Target date: 10/24/2023  Pt will be independent with final HEP for strength, gait, balance in order to build upon functional gains made in therapy Baseline:  Goal status: INITIAL  2.  Pt will improve BERG to at least a 46/56 in order to demo decr fall risk. Baseline: 40/56 Goal status: INITIAL  3.  Pt will improve gait speed with SPC vs. No AD to at least 3.1 ft/sec in order to demo improved community mobility.  Baseline: Gait speed with SPC: 14.7 seconds = 2.23 ft/sec  Goal status: INITIAL  4.  Pt will improve TUG time to 12 seconds or less with no AD in order to demo decrease fall risk.  Baseline: 13.4 seconds with SPC Goal status: INITIAL  5.   Pt will improve 5x sit<>stand to less than or equal to 18 sec with no UE support to demonstrate improved functional strength and transfer efficiency.  Baseline:  Goal status: INITIAL  6.  Pt will ambulate at least 500' outdoors over unlevel surfaces with LRAD and mod I for improved community mobility.  Baseline:  Goal status:  INITIAL  ASSESSMENT:  CLINICAL IMPRESSION: Pt seen for skilled PT treatment session with emphasis on functional bilat UE/LE muscular strength and endurance. Pt continues to progress towards all goals set at initial evaluation. Pt tolerated circuit training targeting cardiorespiratory system, balance, and strength well and put forth great effort for the entirety of the session. No increase in pain. Pt still has decreased activity tolerance but was able to dose therapeutic activity effectively using RPE. Continue POC.    OBJECTIVE IMPAIRMENTS: Abnormal gait, decreased activity tolerance, decreased balance, decreased endurance, decreased mobility, difficulty walking, decreased strength, impaired flexibility, impaired sensation, postural dysfunction, and pain.   ACTIVITY LIMITATIONS: standing, squatting, stairs, transfers, locomotion level, and caring for others  PARTICIPATION LIMITATIONS: driving, shopping, community activity, occupation, and yard work  PERSONAL FACTORS: Behavior pattern, Past/current experiences, Time since onset of injury/illness/exacerbation, and 3+ comorbidities: HTN, focal chorioretinal inflammation-managed with IVIG monthly, borderline glaucoma and retinal edema, multiple vitamin deficiencies, chronic bilateral foot pain, gout, abnormal immunoglobulins, morbid obesity-BMI 48 was working on weight loss with dietitian in preparation for bariatric surgery, polyneuropathy  are also affecting patient's functional outcome.   REHAB POTENTIAL: Good  CLINICAL DECISION MAKING: Evolving/moderate complexity  EVALUATION COMPLEXITY: Moderate  PLAN:  PT FREQUENCY: 2x/week  PT DURATION: 8 weeks   PLANNED INTERVENTIONS: 97164- PT Re-evaluation, 97110-Therapeutic exercises, 97530- Therapeutic activity, 97112- Neuromuscular re-education, 97535- Self Care, 16109- Manual therapy, [phone]- Gait training, [phone]- Aquatic Therapy, Patient/Family education, Balance training, Stair training,  Vestibular training, Cognitive remediation, and DME instructions  PLAN FOR NEXT SESSION: Monitor through RPE, administer rest breaks as needed; targeted functional UE/LE strengthening, update HEP as needed, high-functioning dynamic balance theract   Barbara Book, Student-PT [facility], PT, DPT, CBIS  09/15/2023, 12:01 PM

## 2023-09-18 ENCOUNTER — Ambulatory Visit (INDEPENDENT_AMBULATORY_CARE_PROVIDER_SITE_OTHER): Admitting: Family Medicine

## 2023-09-18 ENCOUNTER — Encounter: Payer: Self-pay | Admitting: Family Medicine

## 2023-09-18 VITALS — BP 138/63 | HR 83 | Ht 69.0 in | Wt 345.6 lb

## 2023-09-18 DIAGNOSIS — M609 Myositis, unspecified: Secondary | ICD-10-CM

## 2023-09-18 DIAGNOSIS — R634 Abnormal weight loss: Secondary | ICD-10-CM

## 2023-09-18 DIAGNOSIS — E538 Deficiency of other specified B group vitamins: Secondary | ICD-10-CM | POA: Diagnosis not present

## 2023-09-18 DIAGNOSIS — G63 Polyneuropathy in diseases classified elsewhere: Secondary | ICD-10-CM | POA: Diagnosis not present

## 2023-09-18 DIAGNOSIS — E539 Vitamin B deficiency, unspecified: Secondary | ICD-10-CM

## 2023-09-18 DIAGNOSIS — K219 Gastro-esophageal reflux disease without esophagitis: Secondary | ICD-10-CM

## 2023-09-18 MED ORDER — DICLOFENAC SODIUM 1 % EX GEL: 4.0000 g | Freq: Four times a day (QID) | CUTANEOUS | 1 refills | Status: AC | PRN

## 2023-09-18 MED ORDER — GABAPENTIN 300 MG PO CAPS: ORAL_CAPSULE | ORAL | 1 refills | Status: DC

## 2023-09-18 NOTE — Assessment & Plan Note (Signed)
 Improving. Continue current pain regimen and working with PT, will add voltaren  gel.

## 2023-09-18 NOTE — Patient Instructions (Signed)
 It was great to see you!  Our plans for today:  - Try going without the pantoprazole . If you have return of nausea or heartburn, we can always restart this.   We are checking some labs today, we will release these results to your MyChart.  Take care and seek immediate care sooner if you develop any concerns.   Dr. Madelon   Here is an example of what a healthy plate looks like:    ? Make half your plate fruits and vegetables.     ? Focus on whole fruits.     ? Vary your veggies.  ? Make half your grains whole grains. -     ? Look for the word "whole" at the beginning of the ingredients list    ? Some whole-grain ingredients include whole oats, whole-wheat flour,        whole-grain corn, whole-grain brown rice, and whole rye.  ? Move to low-fat and fat-free milk or yogurt.  ? Vary your protein routine. - Meat, fish, poultry (chicken, [place]), eggs, beans (kidney, pinto), dairy.  ? Drink and eat less sodium, saturated fat, and added sugars.

## 2023-09-18 NOTE — Assessment & Plan Note (Signed)
 Recheck B vitamin levels. Recommend coming for lab visit just prior to next B12 injection for B12 level. Continue with PT/OT, neuro.

## 2023-09-18 NOTE — Assessment & Plan Note (Signed)
Trial off PPI

## 2023-09-18 NOTE — Progress Notes (Signed)
   SUBJECTIVE:   CHIEF COMPLAINT / HPI:   Vitamin deficiency induced polyneuropathy - due for recheck of vitamin B12, folate. Did get B12 shot yesterday. - continues on aggressive vitamin supplementation - still active with outpatient PT/OT. Hoping to go on class cruise to the [place] in August.  - taking tramadol /robaxin  for pain - has rollator, cane, walker, shower chair.  - feels less muscle fatigue and greater endurance. - still dealing with myositis, feeling more once relaxed, states muscles feel tight. No pain with activity.  - sees Dr. Tobie 7/1 - has upcoming optho appt 6/25 - no N/V. Sometimes a little nausea. Still on PPI, denies heartburn.   OBJECTIVE:   BP 138/63   Pulse 83   Ht 5' 9 (1.753 m)   Wt (!) 345 lb 9.6 oz (156.8 kg)   SpO2 100%   BMI 51.04 kg/m   Gen: well appearing, in NAD Card: Reg rate Lungs: Comfortable WOB on RA Ext: WWP   ASSESSMENT/PLAN:   Gastroesophageal reflux disease Trial off PPI.  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 Recommend coming for lab visit just prior to next B12 injection for B12 level. Continue with PT/OT, neuro.  Myositis of left lower extremity Improving. Continue current pain regimen and working with PT, will add voltaren  gel.   F/u 2 months.  Donald CHRISTELLA Lai, DO

## 2023-09-19 ENCOUNTER — Ambulatory Visit: Admitting: Physical Therapy

## 2023-09-19 ENCOUNTER — Encounter: Payer: Self-pay | Admitting: Physical Therapy

## 2023-09-19 ENCOUNTER — Ambulatory Visit: Admitting: Occupational Therapy

## 2023-09-19 VITALS — BP 132/93 | HR 73

## 2023-09-19 DIAGNOSIS — R29818 Other symptoms and signs involving the nervous system: Secondary | ICD-10-CM

## 2023-09-19 DIAGNOSIS — R2681 Unsteadiness on feet: Secondary | ICD-10-CM | POA: Diagnosis not present

## 2023-09-19 DIAGNOSIS — M6281 Muscle weakness (generalized): Secondary | ICD-10-CM

## 2023-09-19 DIAGNOSIS — G629 Polyneuropathy, unspecified: Secondary | ICD-10-CM

## 2023-09-19 DIAGNOSIS — M25562 Pain in left knee: Secondary | ICD-10-CM

## 2023-09-19 DIAGNOSIS — Q998 Other specified chromosome abnormalities: Secondary | ICD-10-CM

## 2023-09-19 DIAGNOSIS — R208 Other disturbances of skin sensation: Secondary | ICD-10-CM

## 2023-09-19 DIAGNOSIS — R2689 Other abnormalities of gait and mobility: Secondary | ICD-10-CM

## 2023-09-19 DIAGNOSIS — G63 Polyneuropathy in diseases classified elsewhere: Secondary | ICD-10-CM

## 2023-09-19 NOTE — Therapy (Unsigned)
 OUTPATIENT PHYSICAL THERAPY NEURO TREATMENT   Patient Name: Tiffany Velasquez MRN: [id number] DOB:June 23, 1995, 28 y.o., female Today's Date: 09/19/2023   PCP: Madelon Donald HERO, DO  REFERRING PROVIDER: Pegge Toribio PARAS, PA-C     END OF SESSION:  PT End of Session - 09/19/23 1432     Visit Number 6    Number of Visits 17    Date for PT Re-Evaluation 10/28/23    Authorization Type [employer] MEDICARE    PT Start Time 1444    PT Stop Time 1528    PT Time Calculation (min) 44 min    Equipment Utilized During Treatment Gait belt    Activity Tolerance Patient tolerated treatment well    Behavior During Therapy WFL for tasks assessed/performed          Past Medical History:  Diagnosis Date   AKI (acute kidney injury) (HCC) 07/08/2023   Asthma    Eczema    Glaucoma    Hypertension    Morbid obesity (HCC)    Pneumonia of both lungs due to infectious organism 07/19/2023   Tachycardia  Multifocal PNA 07/14/2023   Transaminitis 07/15/2023   Uveitic glaucoma of both eyes, 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   Past Surgical History:  Procedure Laterality Date   BIOPSY  04/04/2023   Procedure: BIOPSY;  Surgeon: Lyndel Deward PARAS, MD;  Location: [facility];  Service: General;;   CATARACT EXTRACTION     ESOPHAGOGASTRODUODENOSCOPY N/A 04/04/2023   Procedure: ESOPHAGOGASTRODUODENOSCOPY (EGD);  Surgeon: Lyndel Deward PARAS, MD;  Location: [facility];  Service: General;  Laterality: N/A;   PORTA CATH INSERTION     Patient Active Problem List   Diagnosis Date Noted   Adjustment disorder with mixed anxiety and depressed mood 07/24/2023   Myositis associated antibody positive 07/20/2023   Vitamin A  deficiency 07/19/2023   Vitamin D  deficiency 07/18/2023   Anemia 07/16/2023   Urinary retention 07/16/2023   Vitamin B12 deficiency 07/16/2023   Peanut  allergy  07/15/2023   Port-A-Cath in place 07/15/2023   Immunosuppression due to drug therapy (HCC) 07/15/2023   Acetazolamide   Therapy 07/15/2023   Highly Elevated C-reactive protein (CRP) 07/15/2023   Folate deficiency 07/15/2023   Elevated sedimentation rate measurement 07/15/2023   Elevated CK 07/15/2023   Opioid Therapy 07/15/2023   Chorioretinal inflammation of both eyes 07/15/2023   mild Glaucoma, steroid induced 07/15/2023   Unintentional weight loss of 10% body weight within 6 months 07/15/2023   Lower extremity weakness, bilateral 07/14/2023   Elevated liver enzymes 07/09/2023   Paresthesia 07/08/2023   Myositis of left lower extremity 07/08/2023   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) 06/05/2023   Gout 05/29/2023   Epigastric abdominal tenderness without rebound tenderness 05/10/2023   Gastroesophageal reflux disease 11/08/2022   Iritis 07/26/2018   Seasonal and perennial allergic rhinitis 07/26/2018   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   Retinal edema 07/13/2018   Not well controlled moderate persistent asthma 01/16/2017   Hypertension 06/22/2012   Morbid obesity (HCC) 10/05/2009   Severe eczema 05/25/2006    ONSET DATE: 08/11/2023  REFERRING DIAG: M60.9 (ICD-10-CM) - Myositis 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 G63 (ICD-10-CM) - Polyneuropathy in diseases classified elsewhere    THERAPY DIAG:  Polyneuropathy associated with underlying disease (HCC)  Unsteadiness on feet  Neuropathy  Left knee pain, unspecified chronicity  Muscle weakness (generalized)  Other abnormalities of gait and  mobility  Myositis associated antibody positive  Rationale for Evaluation and Treatment: Rehabilitation  SUBJECTIVE:                                                                                                                                                                                             SUBJECTIVE STATEMENT: Pt  reports that at church this past Saturday she felt tight/tense in her thigh region. PCP attributed it to muscle mass loss and body attempting to return to baseline. PCP also Pt presents to PT treatment session using 4-prong SPC and currently wearing Crocs in sports mode. Pt reports she is pain free this morning. Pt has noticed her pain has become more intermittent vs. constant. Relieving factors for feet and bilat hamstrings (NPRS: 4/10; dull, achy, sore): rest and lidocaine  cream. Pt reports she noticed more sensation in tops of feet and 5th metatarsal. Denies falls/close calls. PCP said no to driving right now for sensation to continue to return. Will start back at school August 19th and plans to bring rollator   Pt accompanied by: self  PERTINENT HISTORY: HTN, focal chorioretinal inflammation-managed with IVIG monthly, borderline glaucoma and retinal edema, multiple vitamin deficiencies, chronic bilateral foot pain, gout, abnormal immunoglobulins, morbid obesity-BMI 48 was working on weight loss with dietitian in preparation for bariatric surgery, polyneuropathy   She reported multiple falls in 2 weeks and was sent to ED for evaluation. She was found to have low folate, low vitamin D  and low vitamin A  levels, elevated CRP, low B1 less than 6. MRI brain and spine without acute abnormality. 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 No.   PRECAUTIONS: Fall   PATIENT GOALS: Wants to be more independent,  feels like she is still teetering some when she is walking. Wants to get more upper body and leg strength  TREATMENT:  Self-Care / Patient Education:   Vitals:   09/19/23 1502  BP: (!) 132/93  Pulse: 73  *Sitting (pre-exercise) *Pt denies any lightheadedness and dizziness   Education on energy conservation and deep breathing techniques Education on staying hydrated and replenishing body with electrolytes as pt has been spending a lot of time outside  Education on the purpose of strength training, therapeutic dosage, and monitoring for strength gains through RPE Discussion on how to differentiate between muscular fatigue and consequent soreness and pain  Therapeutic Activity:  Circuit-style training -  Round One: 3x 115' overground ambulation farmer's carry using 8# DB in L hand with SBA - no AD  RPE: 7/10 and SOB PT administered 1 min rest break 8 reps of STS at 20 mat table to bilat 3# DB punches RPE: 8/10 and SOB PT administered 1 min rest break  10 reps of step ups with 6 block at parallel bars, ascend/descend with RLE RPE: 8/10 and SOB PT administered 1 min rest break Round Two: 2x 115' overground ambulation farmer's carry using 8# DB in L hand with SBA - no AD RPE: 7.5/10 and SOB 8 reps of STS at 20 mat table to bilat 3# DB punches RPE: 7/10 Pt was able to perform without UE support 10 reps of step ups with 6 block at parallel bars, ascend/descend with LLE RPE: 8/10 and SOB PT administered 1 min rest break  PATIENT EDUCATION: Education details:  continue HEP, activity pacing Person educated: Patient Education method: Explanation, Demonstration, and Verbal cues Education comprehension: verbalized understanding  HOME EXERCISE PROGRAM: Access Code: [email or web address] URL: [email or web address] Date: 08/15/2023 Prepared by: Reche Ohara   Exercises - Supine Bridge  - 1 x daily - 7 x weekly - 1 sets - 5 reps - Straight Leg Raise  - 1 x daily - 7 x weekly - 1 sets - 5 reps - Single Leg Bridge  - 1 x daily - 7 x weekly - 1 sets - 5 reps - Supine 90/90 Alternating Toe Touch One Leg at a Time  - 1 x daily - 7 x weekly - 1 sets - 5 reps - Transition from belly onto hands and knees  - 1 x daily - 7 x weekly - 1 sets - 5 reps - Sit to Stand with Armchair  - 1 x daily - 7 x weekly - 1 sets - 5 reps - Standing Marching  - 1 x daily - 7 x weekly - 1 sets - 20 reps  From inpatient rehab, will revise/update as appropriate   GOALS: Goals reviewed with patient? Yes  SHORT TERM GOALS: Target date: 09/26/2023  Pt will be independent with initial HEP for strength, gait, balance in order to build upon functional gains made in therapy Baseline: Goal status: INITIAL  2.  BERG to be assessed with LTG written. Baseline: 40/56 Goal status: MET  3.  Pt will improve gait speed with SPC to at least 2.6 ft/sec in order to demo improved community mobility.   Baseline: Gait speed with SPC: 14.7 seconds = 2.23 ft/sec  Goal status: REVISED  4.  TUG to be assessed with STG/LTG written.  Baseline: LTG written Goal status: MET  5.  Pt will improve 5x sit<>stand to less than or equal to 22 sec to demonstrate improved functional strength and transfer efficiency.  Baseline: 26.4 seconds with BUE support  Goal status: INITIAL   LONG TERM GOALS: Target date: 10/24/2023  Pt will be independent with final  HEP for strength, gait, balance in order to build upon functional gains made in therapy Baseline:  Goal status: INITIAL  2.  Pt will improve BERG to at least a 46/56 in order to demo decr fall risk. Baseline: 40/56 Goal status: INITIAL  3.  Pt will improve gait speed with SPC vs. No AD to at least 3.1 ft/sec in order to demo improved community mobility.  Baseline: Gait speed with SPC: 14.7 seconds = 2.23 ft/sec  Goal status:  INITIAL  4.  Pt will improve TUG time to 12 seconds or less with no AD in order to demo decrease fall risk.  Baseline: 13.4 seconds with SPC Goal status: INITIAL  5.   Pt will improve 5x sit<>stand to less than or equal to 18 sec with no UE support to demonstrate improved functional strength and transfer efficiency.  Baseline:  Goal status: INITIAL  6.  Pt will ambulate at least 500' outdoors over unlevel surfaces with LRAD and mod I for improved community mobility.  Baseline:  Goal status: INITIAL  ASSESSMENT:  CLINICAL IMPRESSION: Pt seen for skilled PT treatment session with emphasis on progressive functional bilat UE/LE muscular strength, endurance, and initiated stair training. Pt continues to progress towards all goals set at initial evaluation. Pt exhibited great carryover with PT instruction and verbal cues given in previous treatment session - improved motor control in stand>sit during STS and increased hip/knee flexion in overground ambulation with farmer's carry. Pt highly benefits from PT instructions and demonstrations prior to engaging in therapeutic activity. Pt tolerated circuit training targeting cardiorespiratory system, balance, and strength well and put forth awesome effort for the entirety of the session. PT educated pt on keeping a wide BoS when performing STS at home; pt verbalized understanding. No increase in pain. Pt still presents with decreased activity tolerance but PT was able to dose therapeutic activity effectively using RPE. Continue POC.    OBJECTIVE IMPAIRMENTS: Abnormal gait, decreased activity tolerance, decreased balance, decreased endurance, decreased mobility, difficulty walking, decreased strength, impaired flexibility, impaired sensation, postural dysfunction, and pain.   ACTIVITY LIMITATIONS: standing, squatting, stairs, transfers, locomotion level, and caring for others  PARTICIPATION LIMITATIONS: driving, shopping, community activity, occupation,  and yard work  PERSONAL FACTORS: Behavior pattern, Past/current experiences, Time since onset of injury/illness/exacerbation, and 3+ comorbidities: HTN, focal chorioretinal inflammation-managed with IVIG monthly, borderline glaucoma and retinal edema, multiple vitamin deficiencies, chronic bilateral foot pain, gout, abnormal immunoglobulins, morbid obesity-BMI 48 was working on weight loss with dietitian in preparation for bariatric surgery, polyneuropathy  are also affecting patient's functional outcome.   REHAB POTENTIAL: Good  CLINICAL DECISION MAKING: Evolving/moderate complexity  EVALUATION COMPLEXITY: Moderate  PLAN:  PT FREQUENCY: 2x/week  PT DURATION: 8 weeks   PLANNED INTERVENTIONS: 97164- PT Re-evaluation, 97110-Therapeutic exercises, 97530- Therapeutic activity, 97112- Neuromuscular re-education, 97535- Self Care, [id number]- Manual therapy, [phone]- Gait training, [phone]- Aquatic Therapy, Patient/Family education, Balance training, Stair training, Vestibular training, Cognitive remediation, and DME instructions  PLAN FOR NEXT SESSION: progress note/reassess goals due, update HEP; monitor appropriate therex and therapeutic activity prescription through RPE, administer rest breaks PRN; continue with targeted functional global UE/LE strengthening, progress stair training when indicated, update HEP as needed, work on high-functioning dynamic balance   Waddell Nailer, Student-PT   09/19/2023, 3:30 PM   Sheffield Senate, PT, DPT 09/19/23 3:30 PM

## 2023-09-19 NOTE — Therapy (Addendum)
 OUTPATIENT OCCUPATIONAL THERAPY NEURO TREATMENT AND DISCHARGE  Patient Name: Tiffany Velasquez MRN: [id number] DOB:September 08, 1995, 28 y.o., female Today's Date: 09/19/2023  OCCUPATIONAL THERAPY DISCHARGE SUMMARY  Visits from Start of Care: 3  Current functional level related to goals / functional outcomes: Patient has met all short and long-term goals to date.   Remaining deficits: Pt is currently most limited by endurance though it has improved since her evaluation.   Education / Equipment: Continue with HEP as needed for further remediation efforts as needed   Patient agrees to discharge. Patient goals were met. Patient is being discharged due to meeting the stated rehab goals.SABRA  PCP: Madelon Donald HERO, DO  REFERRING PROVIDER: Pegge Toribio PARAS, PA-C  END OF SESSION:  OT End of Session - 09/19/23 1405     Visit Number 3    Number of Visits 7    Date for OT Re-Evaluation 10/06/23    Authorization Type [employer]    OT Start Time 1403    OT Stop Time 1429    OT Time Calculation (min) 26 min    Activity Tolerance Patient tolerated treatment well    Behavior During Therapy [facility] for tasks assessed/performed          Past Medical History:  Diagnosis Date   AKI (acute kidney injury) (HCC) 07/08/2023   Asthma    Eczema    Glaucoma    Hypertension    Morbid obesity (HCC)    Pneumonia of both lungs due to infectious organism 07/19/2023   Tachycardia  Multifocal PNA 07/14/2023   Transaminitis 07/15/2023   Uveitic glaucoma of both eyes, 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   Past Surgical History:  Procedure Laterality Date   BIOPSY  04/04/2023   Procedure: BIOPSY;  Surgeon: Lyndel Deward PARAS, MD;  Location: [facility];  Service: General;;   CATARACT EXTRACTION     ESOPHAGOGASTRODUODENOSCOPY N/A 04/04/2023   Procedure: ESOPHAGOGASTRODUODENOSCOPY (EGD);  Surgeon: Lyndel Deward PARAS, MD;  Location: [facility];  Service: General;  Laterality: N/A;   PORTA CATH  INSERTION     Patient Active Problem List   Diagnosis Date Noted   Adjustment disorder with mixed anxiety and depressed mood 07/24/2023   Myositis associated antibody positive 07/20/2023   Vitamin A  deficiency 07/19/2023   Vitamin D  deficiency 07/18/2023   Anemia 07/16/2023   Urinary retention 07/16/2023   Vitamin B12 deficiency 07/16/2023   Peanut  allergy  07/15/2023   Port-A-Cath in place 07/15/2023   Immunosuppression due to drug therapy (HCC) 07/15/2023   Acetazolamide  Therapy 07/15/2023   Highly Elevated C-reactive protein (CRP) 07/15/2023   Folate deficiency 07/15/2023   Elevated sedimentation rate measurement 07/15/2023   Elevated CK 07/15/2023   Opioid Therapy 07/15/2023   Chorioretinal inflammation of both eyes 07/15/2023   mild Glaucoma, steroid induced 07/15/2023   Unintentional weight loss of 10% body weight within 6 months 07/15/2023   Lower extremity weakness, bilateral 07/14/2023   Elevated liver enzymes 07/09/2023   Paresthesia 07/08/2023   Myositis of left lower extremity 07/08/2023   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) 06/05/2023   Gout 05/29/2023   Epigastric abdominal tenderness without rebound tenderness 05/10/2023   Gastroesophageal reflux disease 11/08/2022   Iritis 07/26/2018   Seasonal and perennial allergic rhinitis 07/26/2018   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   Retinal edema 07/13/2018   Not well controlled moderate persistent asthma 01/16/2017  Hypertension 06/22/2012   Morbid obesity (HCC) 10/05/2009   Severe eczema 05/25/2006   ONSET DATE: 08/15/2023 (Date of referral)  REFERRING DIAG: M60.9 (ICD-10-CM) - Myositis, unspecified 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 She was also able to feel the temperature of her bath water with her L foot.   Pt accompanied by: self  PERTINENT HISTORY: HTN, focal chorioretinal inflammation-managed with IVIG monthly, borderline glaucoma and retinal edema, multiple vitamin deficiencies, chronic bilateral foot pain, gout, abnormal immunoglobulins, morbid obesity-BMI 48 was working on weight loss with dietitian in preparation for bariatric surgery, polyneuropathy    She reported multiple falls in 2 weeks and was sent to ED for evaluation. She was found to have low folate, low vitamin D  and low vitamin A  levels, elevated CRP, low B1 less than 6. MRI brain and spine without acute abnormality. 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 Yes: NPRS scale: 3/10 Pain location: general Pain description: neuropathy with joint pain Aggravating factors: walking Relieving factors: rest  FALLS: Has patient fallen in last 6 months? Yes. Number of falls 6  LIVING ENVIRONMENT: Lives with: lives with mom, dad, older brother and dog Delfino  Lives in: House/apartment Stairs: Yes: External: 3 steps; on left going  up reports goes sideways and feels like she has more control  Has following equipment at home: Single point cane, Walker - 2 wheeled, Walker - 4 wheeled, shower chair, Grab bars, and With [facility] with 3 prong tip  Previously to hospitalization was also living with family.    PLOF: Independent and Vocation/Vocational requirements: was a 3rd grade teacher; was driving; student at [employer]    PATIENT GOALS: Wants to be more independent, feels like she is still teetering some when she is walking. Wants to get more upper body and leg strength   OBJECTIVE:  Note: Objective measures were completed at Evaluation unless otherwise noted.  HAND DOMINANCE: Right  ADLs: Overall ADLs: mod I  IADLs: Shopping: min A Meal Prep: dependent Community mobility: dependent Medication management: setup Financial management: mod I Typing: mod I  MOBILITY STATUS: Needs Assist: depending on day, pt requiring use of AD and supervision  ACTIVITY TOLERANCE: Activity tolerance: good  FUNCTIONAL OUTCOME MEASURES: PSFS: 3.7  09/19/2023: 6.7 total score   Total score = sum of the activity scores/number of activities Minimum detectable change (90%CI) for average score = 2 points Minimum detectable change (90%CI) for single activity score = 3 points  UPPER EXTREMITY ROM:   BUE: WNL  UPPER EXTREMITY MMT:     RUE: WFL poor endurance LUE: WFL  HAND FUNCTION: Grip strength: Right: 55.5 lbs;  Left: 40.3 lbs  COORDINATION: 9 Hole Peg test: Right: 24 sec; Left: 23 sec  SENSATION: Paresthesias reported B - reports EMG/nerve conduction study coming up 09/21/2023  EDEMA: none reported or observed  MUSCLE TONE:WFL  COGNITION: Overall cognitive status: Within functional limits for tasks assessed  VISION: Subjective report: no changes  Baseline vision: Wears glasses for distance only Visual history: cataracts  VISION ASSESSMENT: WFL  PERCEPTION: WFL  PRAXIS: WFL  OBSERVATIONS: Pt arrives with fast  food in hand. Slow, altered gait, though no LOB; fairly well-kept; anxious                                                                                                                           TREATMENT :    Therapist reviewed goals with patient and updated patient progression.  No additional functional limitations identified. Objective measures assessed as noted in Goals section to determine progression towards goals.  PATIENT EDUCATION: Education details: OT d/c; goal progression Person educated: Patient Education method: Explanation, Demonstration, and Verbal cues Education comprehension: verbalized understanding and verbal cues required  HOME EXERCISE PROGRAM: 08/31/2023: BUE green theraband HEP 09/12/2023: Red putty HEP  GOALS:  SHORT TERM GOALS: Target date: 09/28/2023   Patient will demonstrate initial B UE HEP with 25% verbal cues or less for proper execution.  Baseline: Goal status: MET  2.  Pt will independently recall the 5 main sensory precautions (cold, heat, sharp, chemical, and heavy) as needed to prevent injury/harm secondary to impairments.   Baseline:  Goal status: MET  3.  Patient will independently verbalize at least 3 energy conservation principles in relation to ADLs to increase functional independence.  Baseline:  Goal status: MET  LONG TERM GOALS: Target date: 10/06/2023  Patient will demonstrate updated B UE HEP with visual handouts only for proper execution. Baseline:  Goal status: MET  2.  Patient will report at least two-point increase in average PSFS score or at least three-point increase in a single activity score indicating functionally significant improvement given minimum detectable change.  Baseline: 3.7 total score (See above for individual activity scores) 09/19/2023: 6.7 total score Goal status: MET  ASSESSMENT:  CLINICAL IMPRESSION: Patient has met all goals and is appropriate for discharge. She no longer demonstrates medical  necessity for continued skilled occupational therapy services. PLAN:  OT D/C Completed  CONSULTED AND AGREED WITH PLAN OF CARE: Patient   Jocelyn CHRISTELLA Bottom, OT 09/19/2023, 2:31 PM

## 2023-09-20 ENCOUNTER — Ambulatory Visit (INDEPENDENT_AMBULATORY_CARE_PROVIDER_SITE_OTHER): Payer: Self-pay

## 2023-09-20 ENCOUNTER — Ambulatory Visit: Admitting: Neurology

## 2023-09-20 DIAGNOSIS — J309 Allergic rhinitis, unspecified: Secondary | ICD-10-CM | POA: Diagnosis not present

## 2023-09-20 LAB — VITAMIN B1: Thiamine: 134.3 nmol/L (ref 66.5–200.0)

## 2023-09-20 LAB — CBC
Hematocrit: 35 % (ref 34.0–46.6)
Hemoglobin: 10.3 g/dL — ABNORMAL LOW (ref 11.1–15.9)
MCH: 28.6 pg (ref 26.6–33.0)
MCHC: 29.4 g/dL — ABNORMAL LOW (ref 31.5–35.7)
MCV: 97 fL (ref 79–97)
Platelets: 326 10*3/uL (ref 150–450)
RBC: 3.6 x10E6/uL — ABNORMAL LOW (ref 3.77–5.28)
RDW: 13 % (ref 11.7–15.4)
WBC: 5.1 10*3/uL (ref 3.4–10.8)

## 2023-09-20 LAB — FOLATE: Folate: 15 ng/mL (ref >3.0)

## 2023-09-20 MED ORDER — POLYETHYLENE GLYCOL 3350 17 G PO PACK
17.0000 g | PACK | Freq: Every day | ORAL | 6 refills | Status: DC
Start: 2023-09-20 — End: 2024-01-19

## 2023-09-20 NOTE — Addendum Note (Signed)
 Addended by: MADELON DONALD HERO on: 09/20/2023 08:37 AM   Modules accepted: Orders

## 2023-09-21 ENCOUNTER — Encounter: Admitting: Occupational Therapy

## 2023-09-21 ENCOUNTER — Encounter: Admitting: Neurology

## 2023-09-21 ENCOUNTER — Ambulatory Visit

## 2023-09-21 ENCOUNTER — Ambulatory Visit: Payer: Self-pay | Admitting: Family Medicine

## 2023-09-21 DIAGNOSIS — H30033 Focal chorioretinal inflammation, peripheral, bilateral: Secondary | ICD-10-CM | POA: Diagnosis not present

## 2023-09-26 ENCOUNTER — Encounter: Admitting: Occupational Therapy

## 2023-09-26 ENCOUNTER — Encounter: Payer: Self-pay | Admitting: Physical Therapy

## 2023-09-26 ENCOUNTER — Encounter: Payer: Self-pay | Admitting: Neurology

## 2023-09-26 ENCOUNTER — Ambulatory Visit: Attending: Physician Assistant | Admitting: Physical Therapy

## 2023-09-26 ENCOUNTER — Ambulatory Visit (INDEPENDENT_AMBULATORY_CARE_PROVIDER_SITE_OTHER): Admitting: Neurology

## 2023-09-26 VITALS — BP 125/87 | HR 75

## 2023-09-26 VITALS — BP 120/83 | HR 79 | Ht 69.0 in | Wt 338.0 lb

## 2023-09-26 DIAGNOSIS — R2681 Unsteadiness on feet: Secondary | ICD-10-CM | POA: Insufficient documentation

## 2023-09-26 DIAGNOSIS — M6281 Muscle weakness (generalized): Secondary | ICD-10-CM | POA: Insufficient documentation

## 2023-09-26 DIAGNOSIS — Q998 Other specified chromosome abnormalities: Secondary | ICD-10-CM | POA: Insufficient documentation

## 2023-09-26 DIAGNOSIS — E5111 Dry beriberi: Secondary | ICD-10-CM

## 2023-09-26 DIAGNOSIS — G729 Myopathy, unspecified: Secondary | ICD-10-CM

## 2023-09-26 DIAGNOSIS — R208 Other disturbances of skin sensation: Secondary | ICD-10-CM | POA: Insufficient documentation

## 2023-09-26 DIAGNOSIS — G63 Polyneuropathy in diseases classified elsewhere: Secondary | ICD-10-CM | POA: Diagnosis not present

## 2023-09-26 DIAGNOSIS — E538 Deficiency of other specified B group vitamins: Secondary | ICD-10-CM

## 2023-09-26 DIAGNOSIS — M25562 Pain in left knee: Secondary | ICD-10-CM | POA: Diagnosis not present

## 2023-09-26 DIAGNOSIS — R29818 Other symptoms and signs involving the nervous system: Secondary | ICD-10-CM | POA: Insufficient documentation

## 2023-09-26 DIAGNOSIS — R2689 Other abnormalities of gait and mobility: Secondary | ICD-10-CM | POA: Diagnosis not present

## 2023-09-26 DIAGNOSIS — G629 Polyneuropathy, unspecified: Secondary | ICD-10-CM | POA: Insufficient documentation

## 2023-09-26 DIAGNOSIS — E889 Metabolic disorder, unspecified: Secondary | ICD-10-CM | POA: Diagnosis not present

## 2023-09-26 NOTE — Progress Notes (Signed)
 Follow-up Visit   Date: 09/26/2023    Tiffany Velasquez MRN: [id number] DOB: Jan 01, 1996    Tiffany Velasquez is a 28 y.o. 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  The patient was accompanied to the clinic by mom who also provides collateral information.    IMPRESSION/PLAN: Vitamin B12, vitamin B1, and folate deficiency resulting in peripheral neuropathy and myopathy, improving.  Results of NCS/EMG were discussed which confirmed sensorimotor polyneuropathy and patchy myopathy.  Her neurological exam is markedly improved and shows normal motor strength throughout, including distally.  She continues to have reduced sensation in the lower legs and feet.  I explained that neurological recovery is slow, but she had made excellent progress in a short period of time. She also has less painful paresthesias, so I recommend tapering her gabapentin . She may take gabapentin  600mg  in the morning, 300mg  in the afternoon, and 900mg  at bedtime x 2 weeks, then stop afternoon dose and continue 600mg  in the morning and 900mg  at bedtime Continue duloxetine  30mg  twice daily. Continue vitamin B12, vitamin B1, and folate supplements Encouraged to eat three nutritious meals daily - no skipping meals Continue physical therapy  Return to clinic in 3 months  --------------------------------------------- History of present illness: Starting in February 2025, she began having shooting pain involving all the toes.  She was found to have elevated uric acid and treated as gout.  Symptoms improved.  Two weeks later, she began having similar burning and shooting pain involving the feet.  She began having shooting pain in the legs. She saw [facility] who started gabapentin  300mg  at bedtime.   The dose was increased to gabapentin  600mg  three times daily by orthopeadics.  She had minimal relief at this dose.    She was admitted at [facility] 4/11-4/13 with dehydration, generalized weakness, and AKI thought to be due to viral gastroenteritis.  She had MRI lumbar spine which shows mild disc bulge at L5-S1.  Her vitamin B12 was low at 194.  She has not started supplements yet.     She lost 70lb over 6 months and is planning to have bariatric surgery in the summer.  She is not on a daily multivitamin.  Nonsmoker.  She does not drink alcohol.     UPDATE 09/26/2023:  She is here for follow-up visit.  She was admitted to [facility] from 4/18 - 4/24 with severe vitamin deficiency resulting in bilateral leg weakness and pain and found to have myositis, pneumonia, anemia, and neuropathy.  She was discharged to rehab and then home.  She completed OT and continues to do PT.  She is able to walk unassisted and can stand up without using arms.  She continues to have numbness in the lower legs and feet, but no longer in the thighs.  She also has not had any stabbing or burning pain in the feet in several weeks.  She takes gabapentin  600mg  in the morning, 600mg  in the afternoon, and 900mg  at bedtime along with duloxetine  30mg  twice daily.    Medications:  Current Outpatient Medications on File Prior to Visit  Medication Sig Dispense Refill   acetaminophen  (TYLENOL ) 325 MG tablet Take 1-2 tablets (325-650 mg total) by mouth every 4 (four) hours as needed for mild pain (pain score 1-3).     acetaZOLAMIDE  (DIAMOX )  250 MG tablet Take 500 mg by mouth 2 (two) times daily.     Adalimumab 40 MG/0.4ML PNKT Inject 40 mg into the skin every 14 (fourteen) days.     albuterol  (PROVENTIL ) (2.5 MG/3ML) 0.083% nebulizer solution Take 3 mLs (2.5 mg total) by nebulization every 6 (six) hours as needed for wheezing or shortness of breath. (Patient taking differently: Take 2.5 mg by nebulization every 4 (four) hours as needed for  wheezing or shortness of breath.) 75 mL 1   albuterol  (VENTOLIN  HFA) 108 (90 Base) MCG/ACT inhaler Inhale 2 puffs every 4-6 hours as needed for cough, wheeze, tightness in chest, or shortness of breath 8 g 1   allopurinol  (ZYLOPRIM ) 100 MG tablet Take 1 tablet (100 mg total) by mouth daily. 90 tablet 0   azelastine  (ASTELIN ) 0.1 % nasal spray USE 2 SPRAYS IN EACH NOSTRIL TWICE DAILY AS NEEDED FOR RUNNY NOSE/DRAINAGE DOWN THROAT (Patient taking differently: 2 sprays 2 (two) times daily. USE 2 SPRAYS IN EACH NOSTRIL TWICE DAILY AS NEEDED FOR RUNNY NOSE/DRAINAGE DOWN THROAT) 30 mL 5   bimatoprost (LUMIGAN) 0.01 % SOLN Place 1 drop into the right eye at bedtime.     brimonidine  (ALPHAGAN ) 0.2 % ophthalmic solution Place 1 drop into both eyes 3 (three) times daily.     budesonide -formoterol  (SYMBICORT ) 160-4.5 MCG/ACT inhaler Inhale 2 puffs into the lungs 2 (two) times daily. 10.2 g 0   busPIRone  (BUSPAR ) 5 MG tablet Take 1 tablet (5 mg total) by mouth 3 (three) times daily. 270 tablet 1   cyanocobalamin  (VITAMIN B12) 1000 MCG/ML injection Inject 1 mL (1,000 mcg total) into the muscle every 30 (thirty) days. 10 mL 0   cyanocobalamin  1000 MCG tablet Take 1 tablet (1,000 mcg total) by mouth daily. 90 tablet 0   diclofenac  Sodium (VOLTAREN ) 1 % GEL Apply 4 g topically 4 (four) times daily as needed. 50 g 1   dorzolamide -timolol  (COSOPT ) 2-0.5 % ophthalmic solution Place 1 drop into both eyes 2 (two) times daily.     DULoxetine  (CYMBALTA ) 30 MG capsule Take 1 capsule (30 mg total) by mouth 2 (two) times daily. 180 capsule 1   Ensure Max Protein (ENSURE MAX PROTEIN) LIQD Take 330 mLs (11 oz total) by mouth 2 (two) times daily.     famotidine  (PEPCID ) 40 MG tablet Take 1 tablet (40 mg total) by mouth at bedtime. 90 tablet 3   fluticasone  (FLONASE ) 50 MCG/ACT nasal spray Place 1 spray into both nostrils daily. 16 each 3   folic acid  (FOLVITE ) 1 MG tablet Take 1 tablet (1 mg total) by mouth daily. 90 tablet 0    gabapentin  (NEURONTIN ) 300 MG capsule Take 2 capsules (600 mg total) by mouth 2 (two) times daily AND 3 capsules (900 mg total) at bedtime. 420 capsule 1   GAMUNEX-C 20 GM/200ML SOLN      Heparin  Na, Pork, Lock Flsh PF (BD HEPARIN  POSIFLUSH) 100 UNIT/ML SOLN Inject 100 Units into the vein daily as needed (Given by nurse at home).     lidocaine  (LMX) 4 % cream Apply topically 3 (three) times daily. 113 g 0   magnesium  oxide (MAG-OX) 400 (240 Mg) MG tablet Take 1 tablet (400 mg total) by mouth 2 (two) times daily. 60 tablet 0   melatonin 3 MG TABS tablet Take 1 tablet (3 mg total) by mouth at bedtime as needed. 30 tablet 0   methocarbamol  (ROBAXIN ) 500 MG tablet Take 1 tablet (500 mg total) by mouth every  8 (eight) hours as needed for muscle spasms. 30 tablet 0   metoprolol  tartrate (LOPRESSOR ) 25 MG tablet Take 0.5 tablets (12.5 mg total) by mouth 2 (two) times daily. 180 tablet 0   montelukast  (SINGULAIR ) 10 MG tablet Take 1 tablet (10 mg total) by mouth at bedtime. 90 tablet 1   Multiple Vitamin (MULTIVITAMIN WITH MINERALS) TABS tablet Take 1 tablet by mouth daily.     pantoprazole  (PROTONIX ) 40 MG tablet Take 1 tablet (40 mg total) by mouth daily at 6 (six) AM. 30 tablet 0   polycarbophil (FIBERCON) 625 MG tablet Take 1 tablet (625 mg total) by mouth daily. 30 tablet 0   polyethylene glycol (MIRALAX ) 17 g packet Take 17 g by mouth daily. 30 each 6   potassium chloride  (KLOR-CON  M) 10 MEQ tablet Take 3 tablets (30 mEq total) by mouth 2 (two) times daily. 180 tablet 0   potassium chloride  (KLOR-CON ) 10 MEQ tablet TAKE 3 TABLETS BY MOUTH TWICE DAILY 360 tablet 3   RETIN-A  MICRO 0.04 % gel Apply 1 application  topically daily as needed.     RHOPRESSA  0.02 % SOLN Place 1 drop into the right eye at bedtime.     sucralfate  (CARAFATE ) 1 GM/10ML suspension Take 10 mLs (1 g total) by mouth 2 (two) times daily. 60 mL 0   thiamine  (VITAMIN B1) 100 MG tablet Take 1 tablet (100 mg total) by mouth daily. 90  tablet 0   traMADol  (ULTRAM ) 50 MG tablet Take 1 tablet (50 mg total) by mouth every 12 (twelve) hours as needed for severe pain (pain score 7-10). 30 tablet 0   vitamin A  3 MG (10000 UNITS) capsule Take 1 capsule (10,000 Units total) by mouth daily. 30 capsule 0   Vitamin D , Ergocalciferol , (DRISDOL ) 1.25 MG (50000 UNIT) CAPS capsule Take 1 capsule (50,000 Units total) by mouth every 7 (seven) days. 5 capsule 0   No current facility-administered medications on file prior to visit.    Allergies:  Allergies  Allergen Reactions   Other Itching, Rash and Swelling    Seafood,tomato paste, peanut  butter, peaches, oranges, apples Throat swelling  Dust mite, oak trees, grass- causes rash, itching   Peanut -Containing Drug Products Anaphylaxis   Shellfish Allergy  Anaphylaxis    Throat swelling    Apple Juice Rash   Orange Fruit [Citrus] Rash   Peach Flavoring Agent (Non-Screening) Rash   Tomato Rash, Hives and Itching   Tree Extract Rash    Vital Signs:  BP 120/83   Pulse 79   Ht 5' 9 (1.753 m)   Wt (!) 338 lb (153.3 kg)   SpO2 99%   BMI 49.91 kg/m    Neurological Exam: MENTAL STATUS including orientation to time, place, person, recent and remote memory, attention span and concentration, language, and fund of knowledge is normal.  Speech is not dysarthric.  CRANIAL NERVES:  No visual field defects.  Pupil asymmetry with right pupil 3mm and left pupil 1mm, slow to react bilaterally to light (right eye is surgical). Normal conjugate, extra-ocular eye movements in all directions of gaze.  No ptosis.  Face is symmetric. Palate elevates symmetrically.  Tongue is midline.  MOTOR:  Motor strength is 5/5 in all extremities, including distally in the hands and feet.  No atrophy, fasciculations or abnormal movements.  No pronator drift.  Tone is normal.    MSRs:  Reflexes are 2+/4 in the arms, trace at the knees, and absent at the ankles.  SENSORY:  Intact  to vibration at MCP and knees.   Absent vibration at the ankles and great toe.  Temperature and pin prick absent distal to mid-calf into the feet.  Rhomberg sign is mildly positive.  COORDINATION/GAIT:  Normal finger-to- nose-finger.  Intact rapid alternating movements bilaterally.  She is able to stand up without using arms to push off chair. Gait mildly wide based and stable.  Stressed gait intact.  She is unsteady with tandem gait.   Data:  Lab Results  Component Value Date   CKTOTAL 23 (L) 08/07/2023   Lab Results  Component Value Date   VITAMINB12 449 07/15/2023   Lab Results  Component Value Date   FOLATE 15.0 09/18/2023    NCS/EMG of the legs 09/26/2023 The electrophysiologic findings are consistent with a chronic sensorimotor polyneuropathy affecting bilateral lower extremities. 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  MRI cervical and thoracic spine 07/17/2023: 1. Partially imaged bilateral lung opacities, concerning for pneumonia. Recommend chest radiograph or chest CT to further characterize. 2. Otherwise, normal MRI of the cervical and thoracic spine.  MRI left femur 07/16/2023 1. There is edema within the majority of the muscles of the left buttock and thigh, consistent with the expected nonspecific myositis. This may be from an inflammatory, infectious, drug related, or posttraumatic etiologies. Muscle edema femoral are general myopathy can also be from neurogenic etiology. 2. Edema within muscles innervated by the superior gluteal nerve including gluteus medius and gluteus minimus muscles. However, no significant edema within the tensor fascia lata innervated by the superior gluteal nerve. 3. Edema within muscles innervated by the obturator nerve, including obturator externus, and the left adductor longus, brevis, and magnus. However, no significant edema within the gracilis that is innervated by the anterior division of the obturator  nerve. 4. Edema within muscles innervated by the tibial nerve, including the long head of the biceps femoris muscle, semimembranosus, medial and lateral head of the gastrocnemius muscles. However, no significant edema within the semitendinosis innervated by the tibial nerve. 5. Edema within quadriceps muscles innervated by muscular branches of the femoral nerve, including the rectus femoris, vastus medialis, vastus intermedius, and vastus lateralis muscles. However, no significant edema within the sartorius muscle intermediate about the femoral nerve.   MRI lumbar spine 07/15/2023 Stable and essentially normal Lumbar MRI, without and with contrast.  MRI brain 07/15/2023:  Normal  Total time spent reviewing records, interview, history/exam, documentation, and coordination of care on day of encounter:  45 minutes    Thank you for allowing me to participate in patient's care.  If I can answer any additional questions, I would be pleased to do so.    Sincerely,    Arun Herrod K. Tobie, DO

## 2023-09-26 NOTE — Patient Instructions (Signed)
 Week 1 & 2:  Take gabapentin  2 tablets in the morning, 1 tablet in the afternoon, and 3 tablets at bedtime After week 2: Stop afternoon gabapentin .  Continue gabapentin  2 tablets in the morning and 3 tablets at bedtime

## 2023-09-26 NOTE — Therapy (Signed)
 OUTPATIENT PHYSICAL THERAPY NEURO TREATMENT   Patient Name: Tiffany Velasquez MRN: [id number] DOB:11-29-1995, 28 y.o., female Today's Date: 09/26/2023   PCP: Madelon Donald HERO, DO  REFERRING PROVIDER: Pegge Toribio PARAS, PA-C     END OF SESSION:  PT End of Session - 09/26/23 1323     Visit Number 7    Number of Visits 17    Date for PT Re-Evaluation 10/28/23    Authorization Type [employer] MEDICARE    PT Start Time 1325   pt arrived late   PT Stop Time 1357    PT Time Calculation (min) 32 min    Equipment Utilized During Treatment Gait belt    Activity Tolerance Patient tolerated treatment well    Behavior During Therapy WFL for tasks assessed/performed          Past Medical History:  Diagnosis Date   AKI (acute kidney injury) (HCC) 07/08/2023   Asthma    Eczema    Glaucoma    Hypertension    Morbid obesity (HCC)    Pneumonia of both lungs due to infectious organism 07/19/2023   Tachycardia  Multifocal PNA 07/14/2023   Transaminitis 07/15/2023   Uveitic glaucoma of both eyes, 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   Past Surgical History:  Procedure Laterality Date   BIOPSY  04/04/2023   Procedure: BIOPSY;  Surgeon: Lyndel Deward PARAS, MD;  Location: [facility];  Service: General;;   CATARACT EXTRACTION     ESOPHAGOGASTRODUODENOSCOPY N/A 04/04/2023   Procedure: ESOPHAGOGASTRODUODENOSCOPY (EGD);  Surgeon: Lyndel Deward PARAS, MD;  Location: [facility];  Service: General;  Laterality: N/A;   PORTA CATH INSERTION     Patient Active Problem List   Diagnosis Date Noted   Adjustment disorder with mixed anxiety and depressed mood 07/24/2023   Myositis associated antibody positive 07/20/2023   Vitamin A  deficiency 07/19/2023   Vitamin D  deficiency 07/18/2023   Anemia 07/16/2023   Urinary retention 07/16/2023   Vitamin B12 deficiency 07/16/2023   Peanut  allergy  07/15/2023   Port-A-Cath in place 07/15/2023   Immunosuppression due to drug therapy (HCC) 07/15/2023    Acetazolamide  Therapy 07/15/2023   Highly Elevated C-reactive protein (CRP) 07/15/2023   Folate deficiency 07/15/2023   Elevated sedimentation rate measurement 07/15/2023   Elevated CK 07/15/2023   Opioid Therapy 07/15/2023   Chorioretinal inflammation of both eyes 07/15/2023   mild Glaucoma, steroid induced 07/15/2023   Unintentional weight loss of 10% body weight within 6 months 07/15/2023   Lower extremity weakness, bilateral 07/14/2023   Elevated liver enzymes 07/09/2023   Paresthesia 07/08/2023   Myositis of left lower extremity 07/08/2023   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) 06/05/2023   Gout 05/29/2023   Epigastric abdominal tenderness without rebound tenderness 05/10/2023   Gastroesophageal reflux disease 11/08/2022   Iritis 07/26/2018   Seasonal and perennial allergic rhinitis 07/26/2018   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   Retinal edema 07/13/2018   Not well controlled moderate persistent asthma 01/16/2017   Hypertension 06/22/2012   Morbid obesity (HCC) 10/05/2009   Severe eczema 05/25/2006    ONSET DATE: 08/11/2023  REFERRING DIAG: M60.9 (ICD-10-CM) - Myositis 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 G63 (ICD-10-CM) - Polyneuropathy in diseases classified elsewhere    THERAPY DIAG:  Unsteadiness on feet  Left knee pain, unspecified chronicity  Muscle weakness (generalized)  Other abnormalities of gait and mobility  Other symptoms and  signs involving the nervous system  Rationale for Evaluation and Treatment: Rehabilitation  SUBJECTIVE:                                                                                                                                                                                             SUBJECTIVE STATEMENT: Pt presents to PT treatment session using 4-prong  cane and currently wearing Crocs in sports mode. Pt reports she saw her neurologist earlier today who stated pt is making a good progress in her recovery. Pt states she is not in any pain currently. Pt reports she's noticed more sensation in the tops and bottoms of feet and 5th metatarsals. Denies falls/close calls since last session. Started walking [place] with her 3-prong cane every other day (2-3 laps) without notable fatigue; uses a dog leash belt.   Pt accompanied by: self  PERTINENT HISTORY: HTN, focal chorioretinal inflammation-managed with IVIG monthly, borderline glaucoma and retinal edema, multiple vitamin deficiencies, chronic bilateral foot pain, gout, abnormal immunoglobulins, morbid obesity-BMI 48 was working on weight loss with dietitian in preparation for bariatric surgery, polyneuropathy   She reported multiple falls in 2 weeks and was sent to ED for evaluation. She was found to have low folate, low vitamin D  and low vitamin A  levels, elevated CRP, low B1 less than 6. MRI brain and spine without acute abnormality. 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 No.   PRECAUTIONS: Fall   PATIENT GOALS: Wants to be more independent, feels like she is still teetering some when she is walking. Wants to get more upper body and leg strength  TREATMENT:  Self-Care / Patient Education:   Today's Vitals   09/26/23 1334  BP: 125/87  Pulse: 75  *Sitting (pre-exercise) *Pt denies any lightheadedness and dizziness   Further education on staying hydrated and replenishing body with electrolytes as pt has been spending more time outside   Therapeutic Activity:  Circuit-style training -  Round One: 3x 115' moderate-max resisted forward overground ambulation with SBA - no AD  RPE: 7/10 and SOB PT administered 1 min rest break 6 reps of STS at 20 mat table standing with feet apart on foam pad RPE: 7/10 and SOB PT placed chair in front for UE support; pt used for 3/6 reps PT administered 1 min rest break  10 reps of step ups with 6 block at parallel bars, ascend/descend with RLE with unilateral UE support > bilat 2 fingers RPE: 8/10 and SOB PT administered 1 min rest break Round Two (shortened d/t time constraints): 6 reps of STS at 20 mat table standing with feet apart on foam pad RPE: 7/10 Pt was able to perform without any UE support with proper form and technique  PATIENT EDUCATION: Education details: see above, continue HEP, continue aerobic activity via dog walking Person educated: Patient Education method: Explanation, Demonstration, and Verbal cues Education comprehension: verbalized understanding  HOME EXERCISE PROGRAM: Access Code: [email or web address] URL: [email or web address] Date: 08/15/2023 Prepared by: Reche Ohara   Exercises - Supine Bridge  - 1 x daily - 7 x weekly - 1 sets - 5 reps - Straight Leg Raise  - 1 x daily - 7 x weekly - 1 sets - 5 reps - Single Leg Bridge  - 1 x daily - 7 x weekly - 1 sets - 5 reps - Supine 90/90 Alternating Toe Touch One Leg at a Time  - 1 x daily - 7 x weekly - 1 sets - 5 reps - Transition from belly onto hands and knees  - 1 x daily - 7 x weekly - 1 sets - 5 reps - Sit to Stand with Armchair  - 1 x daily - 7 x weekly - 1 sets - 5 reps - Standing Marching  - 1  x daily - 7 x weekly - 1 sets - 20 reps  From inpatient rehab, will revise/update as appropriate   GOALS: Goals reviewed with patient? Yes  SHORT TERM GOALS: Target date: 09/26/2023  Pt will be independent with initial HEP for strength, gait, balance in order to build upon functional gains made in therapy Baseline: Goal status: INITIAL  2.  BERG to be assessed with LTG written. Baseline: 40/56 Goal status: MET  3.  Pt will improve gait speed with SPC to at least 2.6 ft/sec in order to demo improved community mobility.   Baseline: Gait speed with SPC: 14.7 seconds = 2.23 ft/sec  Goal status: REVISED  4.  TUG to be assessed with STG/LTG written.  Baseline: LTG written Goal status: MET  5.  Pt will improve 5x sit<>stand to less than or equal to 22 sec to demonstrate improved functional strength and transfer efficiency.  Baseline: 26.4 seconds with BUE support  Goal status: INITIAL   LONG TERM GOALS: Target date: 10/24/2023  Pt will be independent with final HEP for strength, gait, balance in order to build upon functional gains made in therapy Baseline:  Goal status: INITIAL  2.  Pt will improve BERG to at least a 46/56 in order to demo decr fall risk. Baseline: 40/56 Goal status: INITIAL  3.  Pt  will improve gait speed with SPC vs. No AD to at least 3.1 ft/sec in order to demo improved community mobility.  Baseline: Gait speed with SPC: 14.7 seconds = 2.23 ft/sec  Goal status: INITIAL  4.  Pt will improve TUG time to 12 seconds or less with no AD in order to demo decrease fall risk.  Baseline: 13.4 seconds with SPC Goal status: INITIAL  5.   Pt will improve 5x sit<>stand to less than or equal to 18 sec with no UE support to demonstrate improved functional strength and transfer efficiency.  Baseline:  Goal status: INITIAL  6.  Pt will ambulate at least 500' outdoors over unlevel surfaces with LRAD and mod I for improved community mobility.  Baseline:  Goal status:  INITIAL  ASSESSMENT:  CLINICAL IMPRESSION: Pt seen for skilled PT treatment session with emphasis on progressive functional bilat UE/LE muscular strength, endurance, and continued stair training. Pt continues to progress towards all goals set at initial evaluation. Pt exhibited great carryover with PT instruction and verbal cues given in previous treatment session - improved motor control in stand>sit during STS and increased step/stride length in moderate-max resisted forward overground ambulation. Pt highly benefits from PT instructions and demonstrations prior to engaging in therapeutic activity. Pt tolerated circuit training targeting cardiorespiratory system, balance, and strength well and put forth amazing effort for the entirety of the session; no increase in pain. Session was limited d/t time constraints as the pt arrived late. Pt continues to present with decreased activity tolerance although is continuing to improve from session-to-session; PT was able to dose therapeutic activity effectively using RPE. Continue POC.    OBJECTIVE IMPAIRMENTS: Abnormal gait, decreased activity tolerance, decreased balance, decreased endurance, decreased mobility, difficulty walking, decreased strength, impaired flexibility, impaired sensation, postural dysfunction, and pain.   ACTIVITY LIMITATIONS: standing, squatting, stairs, transfers, locomotion level, and caring for others  PARTICIPATION LIMITATIONS: driving, shopping, community activity, occupation, and yard work  PERSONAL FACTORS: Behavior pattern, Past/current experiences, Time since onset of injury/illness/exacerbation, and 3+ comorbidities: HTN, focal chorioretinal inflammation-managed with IVIG monthly, borderline glaucoma and retinal edema, multiple vitamin deficiencies, chronic bilateral foot pain, gout, abnormal immunoglobulins, morbid obesity-BMI 48 was working on weight loss with dietitian in preparation for bariatric surgery, polyneuropathy   are also affecting patient's functional outcome.   REHAB POTENTIAL: Good  CLINICAL DECISION MAKING: Evolving/moderate complexity  EVALUATION COMPLEXITY: Moderate  PLAN:  PT FREQUENCY: 2x/week  PT DURATION: 8 weeks   PLANNED INTERVENTIONS: 97164- PT Re-evaluation, 97110-Therapeutic exercises, 97530- Therapeutic activity, 97112- Neuromuscular re-education, 97535- Self Care, 02859- Manual therapy, [phone]- Gait training, [phone]- Aquatic Therapy, Patient/Family education, Balance training, Stair training, Vestibular training, Cognitive remediation, and DME instructions  PLAN FOR NEXT SESSION: progress note/reassess goals due, update HEP; monitor appropriate therex and therapeutic activity prescription through RPE, administer rest breaks PRN; continue with targeted functional global UE/LE strengthening, progress stair training when indicated, work on high-functioning dynamic balance   Waddell Nailer, Student-PT   09/26/2023, 1:57 PM   Sheffield Senate, PT, DPT 09/26/23 1:57 PM

## 2023-09-27 ENCOUNTER — Other Ambulatory Visit: Payer: Self-pay

## 2023-09-27 NOTE — Patient Outreach (Signed)
 Complex Care Management   Visit Note  09/27/2023  Name:  Tiffany Velasquez MRN: [id number] DOB: 1995-07-29  Situation: Referral received for Complex Care Management related to myositis I obtained verbal consent from Patient.  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 She has discussed this with neurology.) Neurological Management Strategies: Routine screening Neurological Comment: Patient has had f/u with neurology 09/26/23. She is to begin tapering gabapentin  per their instructions. Reviewed with patient who verbalizes understanding with taper instructions. Next f/u 12/27/23  HEENT HEENT Symptoms Reported: Not assessed      Cardiovascular Cardiovascular Symptoms Reported: Not assessed    Respiratory Respiratory Symptoms Reported: Not assesed    Endocrine Endocrine Symptoms Reported: Not assessed    Gastrointestinal Gastrointestinal Symptoms Reported: No symptoms reported Gastrointestinal Management Strategies: Medication therapy    Genitourinary Genitourinary Symptoms Reported: Not assessed    Integumentary Integumentary Symptoms Reported: Not assessed    Musculoskeletal Musculoskelatal Symptoms Reviewed: Other Other  Musculoskeletal Symptoms: Patient reports she has completed OT. She will be increasing PT to twice a week. Patient reports that her strength has improved a lot Additional Musculoskeletal Details: Alternating between cane, walker, and rollator for ambulation based on her pain that day and/or distance that she will be walking. She reports that she has been able to increase the distance she is able to walk. Musculoskeletal Management Strategies: Medical device, Adequate rest, Exercise Musculoskeletal Comment: Patient has not heard from rheumatology in [facility]. She plans to call to check on referral, provided with phone number. Falls in the past year?: No Number of falls in past year: 1 or less Was there an injury with Fall?: No Fall Risk Category Calculator: 0 Patient Fall Risk Level: Low Fall Risk Patient at Risk for Falls Due to: History of fall(s) Fall risk Follow up: Falls evaluation completed, Education provided  Psychosocial Psychosocial Symptoms Reported: No symptoms reported Behavioral Management Strategies: Medication therapy, Coping strategies, Support group          09/18/2023   11:04 AM  Depression screen PHQ 2/9  Decreased Interest 0  Down, Depressed, Hopeless 0  PHQ - 2 Score 0  Altered sleeping 0  Tired, decreased energy 1  Change in appetite 0  Feeling bad or failure about yourself  0  Trouble concentrating 0  Moving slowly or fidgety/restless 0  Suicidal thoughts 0  PHQ-9 Score 1    There were no vitals filed for this visit.  Medications Reviewed Today     Reviewed by Arno Rosaline SQUIBB, RN (Registered Nurse) on 09/27/23 at 1305  Med List Status: <None>   Medication Order Taking? Sig Documenting Provider Last Dose Status Informant  acetaminophen  (TYLENOL ) 325 MG tablet [id number]  Take 1-2 tablets (325-650 mg  total) by mouth every 4 (four) hours as needed for mild pain (pain score 1-3). Maurice Sharlet RAMAN, PA-C  Active   acetaZOLAMIDE  (DIAMOX ) 250 MG tablet  [id number]  Take 500 mg by mouth 2 (two) times daily. [provider]  Active Self, Pharmacy Records           Med Note EFRAIM, TYELISHA L   Fri Jul 14, 2023  9:00 PM) LF: 07/12/23 for a 30ds  Adalimumab 40 MG/0.4ML PNKT [id number]  Inject 40 mg into the skin every 14 (fourteen) days. [provider]  Active Self, Pharmacy Records  albuterol  (PROVENTIL ) (2.5 MG/3ML) 0.083% nebulizer solution [id number]  Take 3 mLs (2.5 mg total) by nebulization every 6 (six) hours as needed for wheezing or shortness of breath.  Patient taking differently: Take 2.5 mg by nebulization every 4 (four) hours as needed for wheezing or shortness of breath.   Iva Marty Saltness, MD  Active Self, Pharmacy Records  albuterol  (VENTOLIN  HFA) [phone]) MCG/ACT inhaler [id number]  Inhale 2 puffs every 4-6 hours as needed for cough, wheeze, tightness in chest, or shortness of breath Ambs, Arlean HERO, FNP  Active Self, Pharmacy Records           Med Note [facility], TONIA S   Sat Jul 08, 2023  1:05 AM)    allopurinol  (ZYLOPRIM ) 100 MG tablet [id number]  Take 1 tablet (100 mg total) by mouth daily. Rumball, Alison M, DO  Active   azelastine  (ASTELIN ) 0.1 % nasal spray [id number]  USE 2 SPRAYS IN EACH NOSTRIL TWICE DAILY AS NEEDED FOR RUNNY NOSE/DRAINAGE DOWN THROAT  Patient taking differently: 2 sprays 2 (two) times daily. USE 2 SPRAYS IN EACH NOSTRIL TWICE DAILY AS NEEDED FOR RUNNY NOSE/DRAINAGE DOWN THROAT   Iva Marty Saltness, MD  Active Self, Pharmacy Records  bimatoprost (LUMIGAN) 0.01 % SOLN [id number]  Place 1 drop into the right eye at bedtime. [provider]  Active Self, Pharmacy Records           Med Note EFRAIM, TYELISHA L   Fri Jul 14, 2023  9:27 PM) LF: 05/25/23 for a 50ds  brimonidine  (ALPHAGAN ) 0.2 % ophthalmic solution [id number]  Place 1 drop into both eyes 3 (three) times daily. [provider]  Active Self, Pharmacy Records           Med Note EFRAIM, ALFREIDA CROME   Fri Jul 14, 2023  9:25 PM) LF: 06/28/23 for a 50ds  budesonide -formoterol  (SYMBICORT ) 160-4.5 MCG/ACT inhaler [id number]  Inhale 2 puffs into the lungs 2 (two) times daily. Maurice Sharlet RAMAN, PA-C  Active   busPIRone  (BUSPAR ) 5 MG tablet [id number]  Take 1 tablet (5 mg total) by mouth 3 (three) times daily. Rumball, Alison M, DO  Active   cyanocobalamin  (VITAMIN B12) 1000 MCG/ML injection [id number]  Inject 1 mL (1,000 mcg total) into the muscle every 30 (thirty) days. Love, Pamela S, PA-C  Active   cyanocobalamin  1000 MCG tablet [id number]  Take 1 tablet (1,000 mcg total) by mouth daily. Rumball, Alison M, DO  Active   diclofenac  Sodium (VOLTAREN ) 1 % GEL [id number]  Apply 4 g topically 4 (four) times daily as needed. Rumball, Alison M, DO  Active   dorzolamide -timolol  (COSOPT ) 2-0.5 % ophthalmic solution [id number]  Place 1 drop into both eyes 2 (two) times daily. [provider]  Active Self, Pharmacy Records           Med Note EFRAIM, ALFREIDA CROME   Fri Jul 14, 2023  9:25 PM) LF: 07/02/23 for a 50ds  DULoxetine  (CYMBALTA ) 30 MG capsule [id number]  Take 1 capsule (30 mg total) by mouth 2 (two) times daily. Rumball, Alison M, DO  Active   Ensure Max Protein (ENSURE MAX PROTEIN) LIQD [id number]  Take 330 mLs (11 oz total) by mouth 2 (two) times daily. Cleotilde Perkins, DO  Active   famotidine  (PEPCID ) 40 MG tablet [id number]  Take 1 tablet (40 mg total) by mouth at bedtime. Craig Alan SAUNDERS, PA-C  Active Self, Pharmacy Records  fluticasone  (FLONASE ) 50 MCG/ACT nasal spray [id number]  Place 1 spray into both nostrils daily. Cari Arlean HERO, FNP  Active Self, Pharmacy Records           Med Note (LEE, NICOLE   Fri Jul 14, 2023 10:00 PM) Only uses when the Astelin  doesn't work   folic acid  (FOLVITE ) 1 MG tablet [id number]  Take 1 tablet (1 mg total) by mouth daily. Rumball, Alison M, DO  Active   gabapentin  (NEURONTIN ) 300 MG capsule [id number]  Take 2 capsules (600 mg total) by mouth 2 (two) times daily AND 3 capsules  (900 mg total) at bedtime. Rumball, Alison M, DO  Active   GAMUNEX-C 20 GM/200ML SOLN [id number]   [provider]  Active Self, Pharmacy Records           Med Note [facility], ADRIENNE B   Mon Jul 17, 2023  3:03 PM) Rph confirmed w/ infusion pharmacy - next session due 07/15/23  Heparin  Na, Pork, Lock Flsh PF (BD HEPARIN  POSIFLUSH) 100 UNIT/ML SOLN [id number]  Inject 100 Units into the vein daily as needed (Given by nurse at home). [provider]  Active            Med Note LEOBARDO, NICOLE   Fri Jul 14, 2023 11:14 PM) Unable to find guidance for dosage.  Patient states injection/flush is given by her nurse at home.     lidocaine  (LMX) 4 % cream [id number]  Apply topically 3 (three) times daily. Maurice Sharlet RAMAN, PA-C  Active   magnesium  oxide (MAG-OX) 400 (240 Mg) MG tablet [id number]  Take 1 tablet (400 mg total) by mouth 2 (two) times daily. Maurice Sharlet RAMAN, PA-C  Active   melatonin 3 MG TABS tablet [id number]  Take 1 tablet (3 mg total) by mouth at bedtime as needed. Maurice Sharlet RAMAN, PA-C  Active   methocarbamol  (ROBAXIN ) 500 MG tablet [id number]  Take 1 tablet (500 mg total) by mouth every 8 (eight) hours as needed for muscle spasms. Maurice Sharlet RAMAN, PA-C  Active   metoprolol  tartrate (LOPRESSOR ) 25 MG tablet [id number]  Take 0.5 tablets (12.5 mg total) by mouth 2 (two) times daily. Rumball, Alison M, DO  Active   montelukast  (SINGULAIR ) 10 MG tablet [id number]  Take 1 tablet (10 mg total) by mouth at bedtime. Iva Marty Saltness, MD  Active Self, Pharmacy Records  Multiple Vitamin (MULTIVITAMIN WITH MINERALS) TABS tablet [id number]  Take 1 tablet by mouth daily. Cleotilde Perkins, DO  Active   pantoprazole  (PROTONIX ) 40 MG tablet [id number]  Take 1 tablet (40 mg total) by mouth daily at 6 (six) AM. Madelon Donald HERO, DO  Active   polycarbophil (FIBERCON) 625 MG tablet [id number]  Take 1 tablet (625 mg total) by mouth daily. Love, Pamela S, PA-C  Active   polyethylene glycol (MIRALAX ) 17 g packet  [id number]  Take 17 g by mouth daily. Rumball, Alison M, DO  Active   potassium chloride  (KLOR-CON   M) 10 MEQ tablet [id number]  Take 3 tablets (30 mEq total) by mouth 2 (two) times daily. Maurice Sharlet RAMAN, PA-C  Active   potassium chloride  (KLOR-CON ) 10 MEQ tablet [id number]  TAKE 3 TABLETS BY MOUTH TWICE DAILY Rumball, Alison M, DO  Active   RETIN-A  MICRO 0.04 % gel [id number]  Apply 1 application  topically daily as needed. [provider]  Active Self, Pharmacy Records           Med Note LEOBARDO, NICOLE   Fri Jul 14, 2023 10:51 PM) Rcvd OTC  RHOPRESSA  0.02 % SOLN [id number]  Place 1 drop into the right eye at bedtime. [provider]  Active Self, Pharmacy Records           Med Note EFRAIM, TYELISHA LITTIE   Fri Jul 14, 2023  9:27 PM) LF: 06/09/23 for a 50ds  sucralfate  (CARAFATE ) 1 GM/10ML suspension [id number]  Take 10 mLs (1 g total) by mouth 2 (two) times daily. Maurice Sharlet RAMAN, PA-C  Active   thiamine  (VITAMIN B1) 100 MG tablet [id number]  Take 1 tablet (100 mg total) by mouth daily. Rumball, Alison M, DO  Active   traMADol  (ULTRAM ) 50 MG tablet [id number]  Take 1 tablet (50 mg total) by mouth every 12 (twelve) hours as needed for severe pain (pain score 7-10). Debby Fidela LITTIE, NP  Active   vitamin A  3 MG (10000 UNITS) capsule [id number]  Take 1 capsule (10,000 Units total) by mouth daily. Love, Pamela S, PA-C  Active   Vitamin D , Ergocalciferol , (DRISDOL ) 1.25 MG (50000 UNIT) CAPS capsule [id number]  Take 1 capsule (50,000 Units total) by mouth every 7 (seven) days. Rumball, Alison M, DO  Active             Recommendation:   Continue Current Plan of Care  Follow Up Plan:   Telephone follow up appointment date/time:  10/25/23 at 1 PM  Rosaline Finlay, RN MSN [place]  [facility] RN Care Manager Direct Dial: [phone]  Fax: [phone]

## 2023-09-27 NOTE — Patient Instructions (Signed)
 Visit Information  Thank you for taking time to visit with me today. Please don't hesitate to contact me if I can be of assistance to you before our next scheduled appointment.  Your next care management appointment is by telephone on 10/25/23 at 1 PM  Please call the care guide team at [phone] if you need to cancel, schedule, or reschedule an appointment.   Please call the Suicide and Crisis Lifeline: 988 call 1-800-273-TALK (toll free, 24 hour hotline) if you are experiencing a Mental Health or Behavioral Health Crisis or need someone to talk to.  Rosaline Finlay, RN MSN [place]  [facility] Population Health RN Care Manager Direct Dial: [phone]  Fax: [phone]

## 2023-09-28 ENCOUNTER — Ambulatory Visit (INDEPENDENT_AMBULATORY_CARE_PROVIDER_SITE_OTHER)

## 2023-09-28 DIAGNOSIS — J309 Allergic rhinitis, unspecified: Secondary | ICD-10-CM

## 2023-10-02 ENCOUNTER — Other Ambulatory Visit: Payer: Self-pay | Admitting: Family Medicine

## 2023-10-03 ENCOUNTER — Encounter: Admitting: Occupational Therapy

## 2023-10-03 ENCOUNTER — Ambulatory Visit: Admitting: Physical Therapy

## 2023-10-03 ENCOUNTER — Encounter: Payer: Self-pay | Admitting: Physical Therapy

## 2023-10-03 VITALS — BP 125/87 | HR 74

## 2023-10-03 DIAGNOSIS — R2689 Other abnormalities of gait and mobility: Secondary | ICD-10-CM | POA: Diagnosis not present

## 2023-10-03 DIAGNOSIS — G629 Polyneuropathy, unspecified: Secondary | ICD-10-CM

## 2023-10-03 DIAGNOSIS — R29818 Other symptoms and signs involving the nervous system: Secondary | ICD-10-CM | POA: Diagnosis not present

## 2023-10-03 DIAGNOSIS — M6281 Muscle weakness (generalized): Secondary | ICD-10-CM | POA: Diagnosis not present

## 2023-10-03 DIAGNOSIS — Q998 Other specified chromosome abnormalities: Secondary | ICD-10-CM | POA: Diagnosis not present

## 2023-10-03 DIAGNOSIS — M25562 Pain in left knee: Secondary | ICD-10-CM | POA: Diagnosis not present

## 2023-10-03 DIAGNOSIS — G63 Polyneuropathy in diseases classified elsewhere: Secondary | ICD-10-CM

## 2023-10-03 DIAGNOSIS — R2681 Unsteadiness on feet: Secondary | ICD-10-CM | POA: Diagnosis not present

## 2023-10-03 DIAGNOSIS — R208 Other disturbances of skin sensation: Secondary | ICD-10-CM | POA: Diagnosis not present

## 2023-10-03 NOTE — Therapy (Signed)
 OUTPATIENT PHYSICAL THERAPY NEURO TREATMENT   Patient Name: Tiffany Velasquez MRN: [id number] DOB:09-27-1995, 28 y.o., female Today's Date: 10/03/2023   PCP: Madelon Donald HERO, DO  REFERRING PROVIDER: Pegge Toribio PARAS, PA-C     END OF SESSION:  PT End of Session - 10/03/23 1145     Visit Number 8    Number of Visits 17    Date for PT Re-Evaluation 10/28/23    Authorization Type [employer] MEDICARE    PT Start Time 1148   pt arrived late   PT Stop Time 1229    PT Time Calculation (min) 41 min    Equipment Utilized During Treatment Gait belt    Activity Tolerance Patient tolerated treatment well    Behavior During Therapy WFL for tasks assessed/performed          Past Medical History:  Diagnosis Date   AKI (acute kidney injury) (HCC) 07/08/2023   Asthma    Eczema    Glaucoma    Hypertension    Morbid obesity (HCC)    Pneumonia of both lungs due to infectious organism 07/19/2023   Tachycardia  Multifocal PNA 07/14/2023   Transaminitis 07/15/2023   Uveitic glaucoma of both eyes, 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   Past Surgical History:  Procedure Laterality Date   BIOPSY  04/04/2023   Procedure: BIOPSY;  Surgeon: Lyndel Deward PARAS, MD;  Location: [facility];  Service: General;;   CATARACT EXTRACTION     ESOPHAGOGASTRODUODENOSCOPY N/A 04/04/2023   Procedure: ESOPHAGOGASTRODUODENOSCOPY (EGD);  Surgeon: Lyndel Deward PARAS, MD;  Location: [facility];  Service: General;  Laterality: N/A;   PORTA CATH INSERTION     Patient Active Problem List   Diagnosis Date Noted   Adjustment disorder with mixed anxiety and depressed mood 07/24/2023   Myositis associated antibody positive 07/20/2023   Vitamin A  deficiency 07/19/2023   Vitamin D  deficiency 07/18/2023   Anemia 07/16/2023   Urinary retention 07/16/2023   Vitamin B12 deficiency 07/16/2023   Peanut  allergy  07/15/2023   Port-A-Cath in place 07/15/2023   Immunosuppression due to drug therapy (HCC) 07/15/2023    Acetazolamide  Therapy 07/15/2023   Highly Elevated C-reactive protein (CRP) 07/15/2023   Folate deficiency 07/15/2023   Elevated sedimentation rate measurement 07/15/2023   Elevated CK 07/15/2023   Opioid Therapy 07/15/2023   Chorioretinal inflammation of both eyes 07/15/2023   mild Glaucoma, steroid induced 07/15/2023   Unintentional weight loss of 10% body weight within 6 months 07/15/2023   Lower extremity weakness, bilateral 07/14/2023   Elevated liver enzymes 07/09/2023   Paresthesia 07/08/2023   Myositis of left lower extremity 07/08/2023   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) 06/05/2023   Gout 05/29/2023   Epigastric abdominal tenderness without rebound tenderness 05/10/2023   Gastroesophageal reflux disease 11/08/2022   Iritis 07/26/2018   Seasonal and perennial allergic rhinitis 07/26/2018   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   Retinal edema 07/13/2018   Not well controlled moderate persistent asthma 01/16/2017   Hypertension 06/22/2012   Morbid obesity (HCC) 10/05/2009   Severe eczema 05/25/2006    ONSET DATE: 08/11/2023  REFERRING DIAG: M60.9 (ICD-10-CM) - Myositis 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 G63 (ICD-10-CM) - Polyneuropathy in diseases classified elsewhere    THERAPY DIAG:  Unsteadiness on feet  Left knee pain, unspecified chronicity  Muscle weakness (generalized)  Other abnormalities of gait and mobility  Myositis associated antibody  positive  Neuropathy  Polyneuropathy associated with underlying disease (HCC)  Rationale for Evaluation and Treatment: Rehabilitation  SUBJECTIVE:                                                                                                                                                                                             SUBJECTIVE  STATEMENT: Pt presents to PT treatment session with no AD and currently wearing Crocs in sports mode. Pt reports she is doing okay. Has noticed some intermittent sharp pain in the tops of her feet over the past 2 days that she hasn't felt since being in the hospital. Pt states she is not in any pain currently. Has continued to walk her [place], D[place], [place] with her 4-prong cane every other day (2-3 laps) without notable fatigue; uses a dog leash belt.   Pt accompanied by: self  PERTINENT HISTORY: HTN, focal chorioretinal inflammation-managed with IVIG monthly, borderline glaucoma and retinal edema, multiple vitamin deficiencies, chronic bilateral foot pain, gout, abnormal immunoglobulins, morbid obesity-BMI 48 was working on weight loss with dietitian in preparation for bariatric surgery, polyneuropathy   She reported multiple falls in 2 weeks and was sent to ED for evaluation. She was found to have low folate, low vitamin D  and low vitamin A  levels, elevated CRP, low B1 less than 6. MRI brain and spine without acute abnormality. 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 No.   PRECAUTIONS: Fall   PATIENT GOALS: Wants to be more independent, feels like she is still teetering some when she is walking. Wants to get more upper body and leg strength  TREATMENT:  Self-Care /  Patient Education:   Today's Vitals   10/03/23 1157  BP: 125/87  Pulse: 74  *Sitting (pre-exercise)  Discussion surrounding PT POC and clinical findings/results of STG assessment PT verbalized to the pt that she is progressing well towards all goals set at the initial evaluation as she already met LTGs for BERG balance test, TUG time, 5xSTS, and community ambulation Pt partially met LTG for gait speed and PT would review and update/finalize HEP upon d/c PT encouraged pt to think about how she feels about her progress along with any areas she would like to improve upon before d/c PT suspects pt will not need to use all her visits, if pt is agreeable   Physical Performance Measures:  [facility] PT Assessment - 10/03/23 0001       Transfers   Five time sit to stand comments  22 seconds without UE support and no AD - supervision      Ambulation/Gait   Gait velocity 2.76 ft/sec   no AD - supervision     Standardized Balance Assessment   Standardized Balance Assessment Timed Up and Go Test   11.72 seconds with no AD - supervision     Berg Balance Test   Sit to Stand Able to stand without using hands and stabilize independently    Standing Unsupported Able to stand safely 2 minutes    Sitting with Back Unsupported but Feet Supported on Floor or Stool Able to sit safely and securely 2 minutes    Stand to Sit Sits safely with minimal use of hands    Transfers Able to transfer safely, minor use of hands    Standing Unsupported with Eyes Closed Able to stand 10 seconds safely    Standing Unsupported with Feet Together Able to place feet together independently and stand 1 minute safely    From Standing, Reach Forward with Outstretched Arm Can reach confidently >25 cm (10)    From Standing Position, Pick up Object from Floor Able to pick up shoe safely and easily    From Standing Position, Turn to Look Behind Over each Shoulder Looks behind one side only/other side shows less weight shift   Pt  exhibited one LOB looking over R shoulder   Turn 360 Degrees Able to turn 360 degrees safely in 4 seconds or less    Standing Unsupported, Alternately Place Feet on Step/Stool Able to stand independently and safely and complete 8 steps in 20 seconds    Standing Unsupported, One Foot in Front Able to plae foot ahead of the other independently and hold 30 seconds    Standing on One Leg Tries to lift leg/unable to hold 3 seconds but remains standing independently    Total Score 51    Berg comment: 51/56          PATIENT EDUCATION: Education details: see above, continue HEP - update as needed, continue aerobic activity via dog walking, discussion with mom about her thoughts on continuing with PT Person educated: Patient Education method: Explanation, Demonstration, and Verbal cues Education comprehension: verbalized understanding  HOME EXERCISE PROGRAM: Access Code: [email or web address] URL: [email or web address] Date: 08/15/2023 Prepared by: Reche Ohara   Exercises - Supine Bridge  - 1 x daily - 7 x weekly - 1 sets - 5 reps - Straight Leg Raise  - 1 x daily - 7 x weekly - 1 sets - 5 reps - Single Leg Bridge  - 1 x daily - 7 x weekly - 1 sets -  5 reps - Supine 90/90 Alternating Toe Touch One Leg at a Time  - 1 x daily - 7 x weekly - 1 sets - 5 reps - Transition from belly onto hands and knees  - 1 x daily - 7 x weekly - 1 sets - 5 reps - Sit to Stand with Armchair  - 1 x daily - 7 x weekly - 1 sets - 5 reps - Standing Marching  - 1 x daily - 7 x weekly - 1 sets - 20 reps  From inpatient rehab, will revise/update as appropriate   GOALS: Goals reviewed with patient? Yes  SHORT TERM GOALS: Target date: 09/26/2023  Pt will be independent with initial HEP for strength, gait, balance in order to build upon functional gains made in therapy Baseline: pt reports she does them regularly in the morning and does not have any issues Goal status: MET  2.  BERG to be assessed with LTG  written. Baseline: 40/56 51/56 (10/03/23) Goal status: MET  3.  Pt will improve gait speed with SPC to at least 2.6 ft/sec in order to demo improved community mobility.   Baseline: Gait speed with SPC: 14.7 seconds = 2.23 ft/sec  11.90 seconds = 2.76 ft/sec (10/03/23) Goal status: MET  4.  TUG to be assessed with STG/LTG written.  Baseline: LTG written Goal status: MET  5.  Pt will improve 5x sit<>stand to less than or equal to 22 sec to demonstrate improved functional strength and transfer efficiency.  Baseline: 26.4 seconds with BUE support  22 seconds without UE support (10/03/23) Goal status: MET   LONG TERM GOALS: Target date: 10/24/2023  Pt will be independent with final HEP for strength, gait, balance in order to build upon functional gains made in therapy Baseline:  Goal status: INITIAL  2.  Pt will improve BERG to at least a 46/56 in order to demo decr fall risk. Baseline: 40/56 51/56 (10/03/23) Goal status: MET  3.  Pt will improve gait speed with SPC vs. No AD to at least 3.1 ft/sec in order to demo improved community mobility.  Baseline: Gait speed with SPC: 14.7 seconds = 2.23 ft/sec  11.90 seconds = 2.76 ft/sec (10/03/23) Goal status: INITIAL  4.  Pt will improve TUG time to 12 seconds or less with no AD in order to demo decrease fall risk.  Baseline: 13.4 seconds with SPC 11.72 seconds with no AD Goal status: MET  5.   Pt will improve 5x sit<>stand to less than or equal to 18 sec with no UE support to demonstrate improved functional strength and transfer efficiency.  Baseline:  22 seconds without UE support (10/03/23) Goal status: INITIAL  6.  Pt will ambulate at least 500' outdoors over unlevel surfaces with LRAD and mod I for improved community mobility.  Baseline:  Goal status: INITIAL  ASSESSMENT:  CLINICAL IMPRESSION: Pt seen for skilled PT treatment session with an emphasis on STG assessment and education on d/c planning and updated PT POC. Pt continues  to progress towards alll goals set at the initial evaluation. PT initiated discussion surrounding updated POC and d/c planning based off the objective assessment results - pt already met LTGs for BERG balance test, TUG time, 5xSTS, and community ambulation; pt partially met LTG for gait speed and PT would review and update/finalize HEP upon d/c. PT does not foresee needing pt to use all visits if she is agreeable. PT instructed pt to perform self-evaluation of HEP so progressions can be made  next session. Pt states she will also discuss with her mother what she needs to improve upon before d/c, if anything. Pt continues to be challenged with high-level balance, particularly with maintaining tandem stance and SLS; pt notes she struggled with these things at baseline. Continue POC.   OBJECTIVE IMPAIRMENTS: Abnormal gait, decreased activity tolerance, decreased balance, decreased endurance, decreased mobility, difficulty walking, decreased strength, impaired flexibility, impaired sensation, postural dysfunction, and pain.   ACTIVITY LIMITATIONS: standing, squatting, stairs, transfers, locomotion level, and caring for others  PARTICIPATION LIMITATIONS: driving, shopping, community activity, occupation, and yard work  PERSONAL FACTORS: Behavior pattern, Past/current experiences, Time since onset of injury/illness/exacerbation, and 3+ comorbidities: HTN, focal chorioretinal inflammation-managed with IVIG monthly, borderline glaucoma and retinal edema, multiple vitamin deficiencies, chronic bilateral foot pain, gout, abnormal immunoglobulins, morbid obesity-BMI 48 was working on weight loss with dietitian in preparation for bariatric surgery, polyneuropathy  are also affecting patient's functional outcome.   REHAB POTENTIAL: Good  CLINICAL DECISION MAKING: Evolving/moderate complexity  EVALUATION COMPLEXITY: Moderate  PLAN:  PT FREQUENCY: 2x/week  PT DURATION: 8 weeks   PLANNED INTERVENTIONS: [id number]-  PT Re-evaluation, 97110-Therapeutic exercises, 97530- Therapeutic activity, [id number]- Neuromuscular re-education, 97535- Self Care, [id number]- Manual therapy, [phone]- Gait training, [phone]- Aquatic Therapy, Patient/Family education, Balance training, Stair training, Vestibular training, Cognitive remediation, and DME instructions  PLAN FOR NEXT SESSION: update HEP, update POC/visits; FGA? monitor appropriate therex and therapeutic activity prescription through RPE, administer rest breaks PRN; continue with targeted functional global UE/LE strengthening, progress stair training when indicated, work on high-functioning dynamic balance   Waddell Nailer, Student-PT   10/03/2023, 1:57 PM   [place]ffield Senate, PT, DPT 10/03/23 1:57 PM

## 2023-10-06 ENCOUNTER — Encounter: Admitting: Occupational Therapy

## 2023-10-06 ENCOUNTER — Ambulatory Visit: Admitting: Physical Therapy

## 2023-10-06 ENCOUNTER — Encounter: Payer: Self-pay | Admitting: Physical Therapy

## 2023-10-06 VITALS — BP 132/86 | HR 61

## 2023-10-06 DIAGNOSIS — G63 Polyneuropathy in diseases classified elsewhere: Secondary | ICD-10-CM

## 2023-10-06 DIAGNOSIS — R2681 Unsteadiness on feet: Secondary | ICD-10-CM

## 2023-10-06 DIAGNOSIS — Q998 Other specified chromosome abnormalities: Secondary | ICD-10-CM | POA: Diagnosis not present

## 2023-10-06 DIAGNOSIS — M25562 Pain in left knee: Secondary | ICD-10-CM

## 2023-10-06 DIAGNOSIS — R2689 Other abnormalities of gait and mobility: Secondary | ICD-10-CM | POA: Diagnosis not present

## 2023-10-06 DIAGNOSIS — M6281 Muscle weakness (generalized): Secondary | ICD-10-CM | POA: Diagnosis not present

## 2023-10-06 DIAGNOSIS — R208 Other disturbances of skin sensation: Secondary | ICD-10-CM | POA: Diagnosis not present

## 2023-10-06 DIAGNOSIS — G629 Polyneuropathy, unspecified: Secondary | ICD-10-CM | POA: Diagnosis not present

## 2023-10-06 DIAGNOSIS — R29818 Other symptoms and signs involving the nervous system: Secondary | ICD-10-CM | POA: Diagnosis not present

## 2023-10-06 NOTE — Therapy (Signed)
 OUTPATIENT PHYSICAL THERAPY NEURO TREATMENT / 10TH VISIT PROGRESS NOTE   Patient Name: Tiffany Velasquez MRN: [id number] DOB:1995/06/17, 28 y.o., female Today's Date: 10/06/2023   PCP: Madelon Donald HERO, DO  REFERRING PROVIDER: Pegge Toribio PARAS, PA-C   10th Visit Physical Therapy Progress Note  Dates of Reporting Period: 08/29/23 to 10/06/23  END OF SESSION:  PT End of Session - 10/06/23 1107     Visit Number 9    Number of Visits 17    Date for PT Re-Evaluation 10/28/23    Authorization Type [employer]IC[employer]    Progress Note Due on Visit --   done on visit 9   PT Start Time 1108   pt arrived late   PT Stop Time 1145    PT Time Calculation (min) 37 min    Equipment Utilized During Treatment Gait belt    Activity Tolerance Patient tolerated treatment well    Behavior During Therapy WFL for tasks assessed/performed          Past Medical History:  Diagnosis Date   AKI (acute kidney injury) (HCC) 07/08/2023   Asthma    Eczema    Glaucoma    Hypertension    Morbid obesity (HCC)    Pneumonia of both lungs due to infectious organism 07/19/2023   Tachycardia  Multifocal PNA 07/14/2023   Transaminitis 07/15/2023   Uveitic glaucoma of both eyes, 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   Past Surgical History:  Procedure Laterality Date   BIOPSY  04/04/2023   Procedure: BIOPSY;  Surgeon: Lyndel Deward PARAS, MD;  Location: [facility];  Service: General;;   CATARACT EXTRACTION     ESOPHAGOGASTRODUODENOSCOPY N/A 04/04/2023   Procedure: ESOPHAGOGASTRODUODENOSCOPY (EGD);  Surgeon: Lyndel Deward PARAS, MD;  Location: [facility];  Service: General;  Laterality: N/A;   PORTA CATH INSERTION     Patient Active Problem List   Diagnosis Date Noted   Adjustment disorder with mixed anxiety and depressed mood 07/24/2023   Myositis associated antibody positive 07/20/2023   Vitamin A  deficiency 07/19/2023   Vitamin D  deficiency 07/18/2023   Anemia 07/16/2023   Urinary retention  07/16/2023   Vitamin B12 deficiency 07/16/2023   Peanut  allergy  07/15/2023   Port-A-Cath in place 07/15/2023   Immunosuppression due to drug therapy (HCC) 07/15/2023   Acetazolamide  Therapy 07/15/2023   Highly Elevated C-reactive protein (CRP) 07/15/2023   Folate deficiency 07/15/2023   Elevated sedimentation rate measurement 07/15/2023   Elevated CK 07/15/2023   Opioid Therapy 07/15/2023   Chorioretinal inflammation of both eyes 07/15/2023   mild Glaucoma, steroid induced 07/15/2023   Unintentional weight loss of 10% body weight within 6 months 07/15/2023   Lower extremity weakness, bilateral 07/14/2023   Elevated liver enzymes 07/09/2023   Paresthesia 07/08/2023   Myositis of left lower extremity 07/08/2023   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) 06/05/2023   Gout 05/29/2023   Epigastric abdominal tenderness without rebound tenderness 05/10/2023   Gastroesophageal reflux disease 11/08/2022   Iritis 07/26/2018   Seasonal and perennial allergic rhinitis 07/26/2018   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   Retinal edema 07/13/2018   Not well controlled moderate persistent asthma 01/16/2017   Hypertension 06/22/2012   Morbid obesity (HCC) 10/05/2009   Severe eczema 05/25/2006    ONSET DATE: 08/11/2023  REFERRING DIAG: M60.9 (ICD-10-CM) - Myositis 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 G63 (ICD-10-CM) - Polyneuropathy in diseases  classified elsewhere    THERAPY DIAG:  Unsteadiness on feet  Left knee pain, unspecified chronicity  Muscle weakness (generalized)  Other abnormalities of gait and mobility  Myositis associated antibody positive  Polyneuropathy associated with underlying disease (HCC)  Rationale for Evaluation and Treatment: Rehabilitation  SUBJECTIVE:                                                                                                                                                                                              SUBJECTIVE STATEMENT: Pt presents to PT treatment session with no AD and currently wearing Crocs in sports mode. Pt reports her balance is much better. Pt has noticed she's been able to stand up in the shower for a longer period of time; able to take a full shower without having to sit down. Pt states she is not in any pain currently. Walked in the store earlier this week and tolerated 20 minutes of walking without notable fatigue, not needed to use her rollator. Going on a cruise from August 23rd-28th and will start back at school when she returns.  Pt accompanied by: self and niece  PERTINENT HISTORY: HTN, focal chorioretinal inflammation-managed with IVIG monthly, borderline glaucoma and retinal edema, multiple vitamin deficiencies, chronic bilateral foot pain, gout, abnormal immunoglobulins, morbid obesity-BMI 48 was working on weight loss with dietitian in preparation for bariatric surgery, polyneuropathy   She reported multiple falls in 2 weeks and was sent to ED for evaluation. She was found to have low folate, low vitamin D  and low vitamin A  levels, elevated CRP, low B1 less than 6. MRI brain and spine without acute abnormality. 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 No.   PRECAUTIONS: Fall   PATIENT GOALS: Wants to be more independent, feels like she is  still teetering some when she is walking. Wants to get more upper body and leg strength  TREATMENT:  Self-Care / Patient Education:   Today's Vitals   10/06/23 1115  BP: 132/86  Pulse: 61  *sitting (pre-exercise) Discussion surrounding PT POC and clinical findings/results of FGA  PT verbalized to the pt that she is progressing well towards all goals set at the initial evaluation and will continue to address balance, strength/endurance UE/LE deficits in order to promote confidence in ADLs and returning to work upon d/c  Education on how to perform stair training in a safe manner at home to promote autonomy in current HEP   Physical Performance Measures:  [facility] PT Assessment - 10/06/23 0001       Functional Gait  Assessment   Gait assessed  Yes    Gait Level Surface Walks 20 ft in less than 7 sec but greater than 5.5 sec, uses assistive device, slower speed, mild gait deviations, or deviates 6-10 in outside of the 12 in walkway width.   8.44 seconds with no AD   Change in Gait Speed Able to change speed, demonstrates mild gait deviations, deviates 6-10 in outside of the 12 in walkway width, or no gait deviations, unable to achieve a major change in velocity, or uses a change in velocity, or uses an assistive device.    Gait with Horizontal Head Turns Performs head turns with moderate changes in gait velocity, slows down, deviates 10-15 in outside 12 in walkway width but recovers, can continue to walk.    Gait with Vertical Head Turns Performs task with slight change in gait velocity (eg, minor disruption to smooth gait path), deviates 6 - 10 in outside 12 in walkway width or uses assistive device    Gait and Pivot Turn Pivot turns safely within 3 sec and stops quickly with no loss of balance.    Step Over Obstacle Is able to step over 2 stacked shoe boxes taped together  (9 in total height) without changing gait speed. No evidence of imbalance.    Gait with Narrow Base of Support Ambulates 4-7 steps.    Gait with Eyes Closed Walks 20 ft, slow speed, abnormal gait pattern, evidence for imbalance, deviates 10-15 in outside 12 in walkway width. Requires more than 9 sec to ambulate 20 ft.   11 seconds with no AD   Ambulating Backwards Walks 20 ft, slow speed, abnormal gait pattern, evidence for imbalance, deviates 10-15 in outside 12 in walkway width.   23.87 seconds with no AD   Steps Two feet to a stair, must use rail.    Total Score 17    FGA comment: 17/30 = high fall risk          [facility] PT Assessment - 10/06/23 0001       Functional Gait  Assessment   Gait assessed  Yes    Gait Level Surface Walks 20 ft in less than 7 sec but greater than 5.5 sec, uses assistive device, slower speed, mild gait deviations, or deviates 6-10 in outside of the 12 in walkway width.   8.44 seconds with no AD   Change in Gait Speed Able to change speed, demonstrates mild gait deviations, deviates 6-10 in outside of the 12 in walkway width, or no gait deviations, unable to achieve a major change in velocity, or uses a change in velocity, or uses an assistive device.    Gait with Horizontal Head Turns Performs head turns with moderate changes in gait velocity, slows down, deviates 10-15 in outside 12 in walkway width but recovers, can continue to  walk.    Gait with Vertical Head Turns Performs task with slight change in gait velocity (eg, minor disruption to smooth gait path), deviates 6 - 10 in outside 12 in walkway width or uses assistive device    Gait and Pivot Turn Pivot turns safely within 3 sec and stops quickly with no loss of balance.    Step Over Obstacle Is able to step over 2 stacked shoe boxes taped together (9 in total height) without changing gait speed. No evidence of imbalance.    Gait with Narrow Base of Support Ambulates 4-7 steps.    Gait with Eyes Closed Walks 20  ft, slow speed, abnormal gait pattern, evidence for imbalance, deviates 10-15 in outside 12 in walkway width. Requires more than 9 sec to ambulate 20 ft.   11 seconds with no AD   Ambulating Backwards Walks 20 ft, slow speed, abnormal gait pattern, evidence for imbalance, deviates 10-15 in outside 12 in walkway width.   23.87 seconds with no AD   Steps Two feet to a stair, must use rail.    Total Score 17    FGA comment: 17/30 = high fall risk         Therapeutic Activity: 1x 8 reps of step up's on 4 block with knee drive using unilateral UE support > 2 fingers - CGA RPE: 7/10 Pt challenged LLE>RLE from a muscular endurance standpoint as pt fatigued with fewer reps, requiring a ~1 minute standing rest break in the middle of the set  PATIENT EDUCATION: Education details: see above, PT POC, continue HEP - update as needed, continue aerobic activity Person educated: Patient Education method: Explanation, Demonstration, and Verbal cues Education comprehension: verbalized understanding  HOME EXERCISE PROGRAM: Access Code: [email or web address] URL: [email or web address] Date: 08/15/2023 Prepared by: Reche Ohara   Exercises - Supine Bridge  - 1 x daily - 7 x weekly - 1 sets - 5 reps - Straight Leg Raise  - 1 x daily - 7 x weekly - 1 sets - 5 reps - Single Leg Bridge  - 1 x daily - 7 x weekly - 1 sets - 5 reps - Supine 90/90 Alternating Toe Touch One Leg at a Time  - 1 x daily - 7 x weekly - 1 sets - 5 reps - Transition from belly onto hands and knees  - 1 x daily - 7 x weekly - 1 sets - 5 reps - Sit to Stand with Armchair  - 1 x daily - 7 x weekly - 1 sets - 5 reps - Standing Marching  - 1 x daily - 7 x weekly - 1 sets - 20 reps  From inpatient rehab, will revise/update as appropriate   GOALS: Goals reviewed with patient? Yes  SHORT TERM GOALS: Target date: 09/26/2023  Pt will be independent with initial HEP for strength, gait, balance in order to build upon functional gains  made in therapy Baseline: pt reports she does them regularly in the morning and does not have any issues Goal status: MET  2.  BERG to be assessed with LTG written. Baseline: 40/56 51/56 (10/03/23) Goal status: MET  3.  Pt will improve gait speed with SPC to at least 2.6 ft/sec in order to demo improved community mobility.   Baseline: Gait speed with SPC: 14.7 seconds = 2.23 ft/sec  11.90 seconds = 2.76 ft/sec (10/03/23) Goal status: MET  4.  TUG to be assessed with STG/LTG written.  Baseline: LTG written Goal status: MET  5.  Pt will improve 5x sit<>stand to less than or equal to 22 sec to demonstrate improved functional strength and transfer efficiency.  Baseline: 26.4 seconds with BUE support  22 seconds without UE support (10/03/23) Goal status: MET   LONG TERM GOALS: Target date: 10/24/2023  Pt will be independent with final HEP for strength, gait, balance in order to build upon functional gains made in therapy Baseline:  Goal status: INITIAL  2.  Pt will improve BERG to at least a 46/56 in order to demo decr fall risk. Baseline: 40/56 51/56 (10/03/23) Goal status: MET  3.  Pt will improve gait speed with SPC vs. No AD to at least 3.1 ft/sec in order to demo improved community mobility.  Baseline: Gait speed with SPC: 14.7 seconds = 2.23 ft/sec  11.90 seconds = 2.76 ft/sec (10/03/23) Goal status: INITIAL  4.  Pt will improve TUG time to 12 seconds or less with no AD in order to demo decrease fall risk.  Baseline: 13.4 seconds with SPC 11.72 seconds with no AD Goal status: MET  5.   Pt will improve 5x sit<>stand to less than or equal to 18 sec with no UE support to demonstrate improved functional strength and transfer efficiency.  Baseline:  22 seconds without UE support (10/03/23) Goal status: INITIAL  6.  Pt will ambulate at least 500' outdoors over unlevel surfaces with LRAD and mod I for improved community mobility.  Baseline:  Goal status: INITIAL  7.  Pt will  improve FGA to at least a 20/30 in order to demo decr fall risk.  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 Pt continues to progress towards all goals set at the initial evaluation. Updated POC and d/c plan for 1x/week for 3 more weeks - pt needs to skip next week for scheduled infusion. Pt continues to be challenged with high-level balance, particularly with SLS from a bilat LE muscular strength/endurance standpoint. PT will continue to address the aforementioned deficits to improve confidence in performing ADLs and improve QoL. Continue POC.   OBJECTIVE IMPAIRMENTS: Abnormal gait, decreased activity tolerance, decreased balance, decreased endurance, decreased mobility, difficulty walking, decreased strength, impaired flexibility, impaired sensation, postural dysfunction, and pain.   ACTIVITY LIMITATIONS: standing, squatting, stairs, transfers, locomotion level, and caring for others  PARTICIPATION LIMITATIONS: driving, shopping, community activity, occupation, and yard work  PERSONAL FACTORS: Behavior pattern, Past/current experiences, Time since onset of injury/illness/exacerbation, and 3+ comorbidities: HTN, focal chorioretinal inflammation-managed with IVIG monthly, borderline glaucoma and retinal edema, multiple vitamin deficiencies, chronic bilateral foot pain, gout, abnormal immunoglobulins, morbid obesity-BMI 48 was working on weight loss with dietitian in preparation for bariatric surgery, polyneuropathy  are also affecting patient's functional outcome.   REHAB POTENTIAL: Good  CLINICAL DECISION MAKING: Evolving/moderate complexity  EVALUATION COMPLEXITY:  Moderate  PLAN:  PT FREQUENCY: 2x/week  PT DURATION: 8 weeks   PLANNED INTERVENTIONS: [id number]- PT Re-evaluation, 97110-Therapeutic exercises, 97530- Therapeutic activity, 97112- Neuromuscular re-education, 97535- Self Care, [id number]- Manual therapy, [phone]- Gait training, [phone]- Aquatic Therapy, Patient/Family education, Balance training, Stair training, Vestibular training, Cognitive remediation, and DME instructions  PLAN FOR NEXT SESSION: floor transfers, update HEP as needed; monitor appropriate therex and therapeutic activity prescription through RPE, administer rest breaks  PRN; continue with targeted functional global UE/LE strengthening, progress stair training when indicated, work on high-functioning dynamic balance   Waddell Nailer, Student-PT  10/06/2023, 12:27 PM   Sheffield Senate, PT, DPT 10/06/23 12:27 PM

## 2023-10-09 DIAGNOSIS — H30033 Focal chorioretinal inflammation, peripheral, bilateral: Secondary | ICD-10-CM | POA: Diagnosis not present

## 2023-10-10 ENCOUNTER — Ambulatory Visit: Admitting: Physical Therapy

## 2023-10-10 DIAGNOSIS — J301 Allergic rhinitis due to pollen: Secondary | ICD-10-CM | POA: Diagnosis not present

## 2023-10-10 NOTE — Progress Notes (Signed)
 VIALS MADE 10-10-23

## 2023-10-11 DIAGNOSIS — H26491 Other secondary cataract, right eye: Secondary | ICD-10-CM | POA: Diagnosis not present

## 2023-10-11 DIAGNOSIS — H209 Unspecified iridocyclitis: Secondary | ICD-10-CM | POA: Diagnosis not present

## 2023-10-11 DIAGNOSIS — Z961 Presence of intraocular lens: Secondary | ICD-10-CM | POA: Diagnosis not present

## 2023-10-11 DIAGNOSIS — H30033 Focal chorioretinal inflammation, peripheral, bilateral: Secondary | ICD-10-CM | POA: Diagnosis not present

## 2023-10-11 DIAGNOSIS — J3089 Other allergic rhinitis: Secondary | ICD-10-CM | POA: Diagnosis not present

## 2023-10-11 DIAGNOSIS — H40043 Steroid responder, bilateral: Secondary | ICD-10-CM | POA: Diagnosis not present

## 2023-10-11 DIAGNOSIS — H4043X4 Glaucoma secondary to eye inflammation, bilateral, indeterminate stage: Secondary | ICD-10-CM | POA: Diagnosis not present

## 2023-10-11 DIAGNOSIS — H268 Other specified cataract: Secondary | ICD-10-CM | POA: Diagnosis not present

## 2023-10-12 ENCOUNTER — Ambulatory Visit

## 2023-10-13 ENCOUNTER — Encounter: Admitting: Physical Medicine & Rehabilitation

## 2023-10-15 ENCOUNTER — Other Ambulatory Visit: Payer: Self-pay | Admitting: Student

## 2023-10-16 ENCOUNTER — Encounter: Payer: Self-pay | Admitting: Physical Medicine & Rehabilitation

## 2023-10-16 ENCOUNTER — Encounter: Attending: Registered Nurse | Admitting: Physical Medicine & Rehabilitation

## 2023-10-16 VITALS — BP 113/77 | HR 72 | Ht 69.0 in | Wt 350.6 lb

## 2023-10-16 DIAGNOSIS — Q998 Other specified chromosome abnormalities: Secondary | ICD-10-CM | POA: Diagnosis not present

## 2023-10-16 DIAGNOSIS — G894 Chronic pain syndrome: Secondary | ICD-10-CM | POA: Diagnosis not present

## 2023-10-16 DIAGNOSIS — G629 Polyneuropathy, unspecified: Secondary | ICD-10-CM | POA: Diagnosis not present

## 2023-10-16 DIAGNOSIS — I10 Essential (primary) hypertension: Secondary | ICD-10-CM | POA: Insufficient documentation

## 2023-10-16 MED ORDER — TRAMADOL HCL 50 MG PO TABS
50.0000 mg | ORAL_TABLET | Freq: Two times a day (BID) | ORAL | 0 refills | Status: DC | PRN
Start: 2023-10-16 — End: 2024-02-07

## 2023-10-16 NOTE — Progress Notes (Unsigned)
 Subjective:    Patient ID: Tiffany Velasquez, female    DOB: June 18, 1995, 28 y.o.   MRN: [id number]  HPI Tiffany Velasquez is here for follow-up after completing treatment at [facility].  She reports she is doing well overall.  She still working with physical therapy, has completed OT for now.  She reports she is following with PCP, taking all medications as directed.  She reports she had blood work and B12 levels and other vitamin levels were improved.  She continues to have issues with balance.  Pain has improved since she was at the hospital.  She continues to have tingling pain in both feet particularly the dorsal side and her toes.  Pain is less burning than it used to be.  She continues to use gabapentin  but not taking 600 mg in the morning, 600 mg in the afternoon, and 900 mg at night.  She still uses a walker and rollator for ambulation. Occasionally still needing tramadol  when pain is very severe.  No side effects of this medication. Pain Inventory Average Pain 8 Pain Right Now 7 My pain is sharp, dull, stabbing, tingling, and aching  In the last 24 hours, has pain interfered with the following? General activity 4 Relation with others 4 Enjoyment of life 4 What TIME of day is your pain at its worst? morning  and evening Sleep (in general) Fair  Pain is worse with: walking and some activites Pain improves with: rest, pacing activities, and medication Relief from Meds: 4  Family History  Problem Relation Age of Onset  . Asthma Mother   . Allergic rhinitis Father   . Sudden death Cousin   . Eczema Neg Hx   . Urticaria Neg Hx    Social History   Socioeconomic History  . Marital status: Single    Spouse name: Not on file  . Number of children: Not on file  . Years of education: Not on file  . Highest education level: Associate degree: occupational, Scientist, product/process development, or vocational program  Occupational History  . Not on file  Tobacco Use  . Smoking status: Never    Passive exposure: Never  .  Smokeless tobacco: Never  Vaping Use  . Vaping status: Never Used  Substance and Sexual Activity  . Alcohol use: Yes    Comment: rarely  . Drug use: No  . Sexual activity: Not Currently  Other Topics Concern  . Not on file  Social History Narrative   Patient lives with parents.    Social Drivers of Corporate investment banker Strain: Low Risk  (09/18/2023)   Overall Financial Resource Strain (CARDIA)   . Difficulty of Paying Living Expenses: Not very hard  Food Insecurity: Patient Declined (09/18/2023)   Hunger Vital Sign   . Worried About Programme researcher, broadcasting/film/video in the Last Year: Patient declined   . Ran Out of Food in the Last Year: Patient declined  Transportation Needs: Patient Declined (09/18/2023)   PRAPARE - Transportation   . Lack of Transportation (Medical): Patient declined   . Lack of Transportation (Non-Medical): Patient declined  Physical Activity: Insufficiently Active (09/18/2023)   Exercise Vital Sign   . Days of Exercise per Week: 2 days   . Minutes of Exercise per Session: 20 min  Stress: No Stress Concern Present (09/18/2023)   [employer] of Occupational Health - Occupational Stress Questionnaire   . Feeling of Stress: Only a little  Social Connections: Moderately Isolated (09/18/2023)   Social Connection and Isolation  Panel   . Frequency of Communication with Friends and Family: Three times a week   . Frequency of Social Gatherings with Friends and Family: Twice a week   . Attends Religious Services: More than 4 times per year   . Active Member of Clubs or Organizations: No   . Attends Banker Meetings: Not on file   . Marital Status: Never married   Past Surgical History:  Procedure Laterality Date  . BIOPSY  04/04/2023   Procedure: BIOPSY;  Surgeon: Lyndel Deward PARAS, MD;  Location: [facility];  Service: General;;  . CATARACT EXTRACTION    . ESOPHAGOGASTRODUODENOSCOPY N/A 04/04/2023   Procedure: ESOPHAGOGASTRODUODENOSCOPY (EGD);   Surgeon: Lyndel Deward PARAS, MD;  Location: [facility];  Service: General;  Laterality: N/A;  . PORTA CATH INSERTION     Past Surgical History:  Procedure Laterality Date  . BIOPSY  04/04/2023   Procedure: BIOPSY;  Surgeon: Lyndel Deward PARAS, MD;  Location: [facility];  Service: General;;  . CATARACT EXTRACTION    . ESOPHAGOGASTRODUODENOSCOPY N/A 04/04/2023   Procedure: ESOPHAGOGASTRODUODENOSCOPY (EGD);  Surgeon: Lyndel Deward PARAS, MD;  Location: [facility];  Service: General;  Laterality: N/A;  . PORTA CATH INSERTION     Past Medical History:  Diagnosis Date  . AKI (acute kidney injury) (HCC) 07/08/2023  . Asthma   . Eczema   . Glaucoma   . Hypertension   . Morbid obesity (HCC)   . Pneumonia of both lungs due to infectious organism 07/19/2023  . Tachycardia  Multifocal PNA 07/14/2023  . Transaminitis 07/15/2023  . Uveitic glaucoma of both eyes, 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   BP 113/77 (BP Location: Left Arm, Patient Position: Sitting, Cuff Size: Large)   Pulse 72   Ht 5' 9 (1.753 m)   Wt (!) 350 lb 9.6 oz (159 kg)   LMP  (Approximate)   SpO2 97%   BMI 51.77 kg/m   Opioid Risk Score:   Fall Risk Score:  `1  Depression screen [facility] 2/9     10/16/2023   11:53 AM 09/18/2023   11:04 AM 08/30/2023   10:41 AM 08/24/2023   11:33 AM 06/05/2023   10:42 AM 05/29/2023   10:37 AM 05/22/2023    3:28 PM  Depression screen PH[facility] 2/9  Decreased Interest 0 0 0 2 0 1 1  Down, Depressed, Hopeless 0 0 0 0 0 2 0  PHQ - 2 Score 0 0 0 2 0 3 1  Altered sleeping 0 0  0 0 2 0  Tired, decreased energy 0 1  2 1 2 1   Change in appetite 0 0  0 0 2 1  Feeling bad or failure about yourself  0 0  0 0 1 0  Trouble concentrating 0 0  0 0 2 0  Moving slowly or fidgety/restless 0 0  0 0 2 0  Suicidal thoughts 0 0  0 0 0 0  PHQ-9 Score 0 1  4 1 14 3   Difficult doing work/chores Somewhat difficult   Somewhat difficult Not difficult at all        Review of Systems  Gastrointestinal:   Negative for nausea.  Musculoskeletal:        Bilateral  pain upper legs, left knee painl       Objective:   Physical Exam     10/16/2023   11:58 AM 10/06/2023   11:15 AM 10/03/2023   11:57 AM  Vitals with BMI  Height 5' 9  Weight 350 lbs 10 oz    BMI 51.75    Systolic 113 132 874  Diastolic 77 86 87  Pulse 72 61 74     Gen: no distress, normal appearing HEENT: oral mucosa pink and moist, NCAT Chest: normal effort, normal rate of breathing Abd: soft, non-distended Ext: no edema Psych: pleasant, normal affect Skin: intact Neuro: Alert and awake, follows commands, cranial nerves II through XII grossly intact, normal speech and language RUE: 5/5 Deltoid, 5/5 Biceps, 5/5 Triceps, 5/5 Wrist Ext, 5/5 Grip LUE: 5/5 Deltoid, 5/5 Biceps, 5/5 Triceps, 5/5 Wrist Ext, 5/5 Grip RLE: HF 5/5, KE 5/5, ADF 5/5, APF 5/5 LLE: HF 5/5, KE 5/5, ADF 5/5, APF 5/5 Altered sensation in bilateral lower extremities below the knees.  She reports this is no significant in her feet.  No abnormal tone noted Musculoskeletal:  No joint swelling or tenderness noted today Ambulating with walker    Assessment & Plan:     1. 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 Peripheral neuropathy  - Continue tramadol  50 mg twice daily as needed for breakthrough pain, #30 ordered  --Discussed Qutenza as an option for neuropathic pain control. Discussed that this is a capsaicin patch, stronger than capsaicin cream. Discussed that it is currently approved for diabetic peripheral neuropathy and post-herpetic neuralgia, but that it has also shown benefit in treating other forms of neuropathy. Provided patient with link to site to learn more about the patch: https://www.clark.biz/. Discussed that the patch would be placed in office and benefits usually  last 3 months. Discussed that unintended exposure to capsaicin can cause severe irritation of eyes, mucous membranes, respiratory tract, and skin, but that Qutenza is a local treatment and does not have the systemic side effects of other nerve medications. Discussed that there may be pain, itching, erythema, and decreased sensory function associated with the application of Qutenza. Side effects usually subside within 1 week. A cold pack of analgesic medications can help with these side effects. Blood pressure can also be increased due to pain associated with administration of the patch.    3. 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 Latanoprost  and alphagan  gtts             --IVIG 04/21- 04/24. Followed by Dr. Paticia Fairly. -07/23/23 humira, retinA, and rhopressa  listed in pharmacy note as resume as needed in [facility] or at discharge; clarify when/if these need to be restarted while here 4-29: Should be OK to resume Retin A and Rhopressa ; will inquire about bringing from home. -- will check with Optho about Cellcept and Humira resumption.  4-30: Family brought Rhopressa  and Humira in from home; pharmacy holding Humira until clarified by Optho.  Rhopressa  resumed. 5-1: Spoke with Dr. Fairly, continue to hold Humira and CellCept pending outpatient follow-up with him shortly after discharge.   15. Urinary retention: Purewick being used--monitor voiding with PVR/bladder scan. Toilet every 4 hours             -4/25 PVR 64, continue to monitor              - 4-28: No recent PVRs documented, patient denies incontinence, monitor   16. Chronic Asthma: Continue Dulera  BID and singulair  10mg  nightly, azelastin BID --Followed by Dr. Iva.  17. H/o chronic gastritis w/nausea and vomiting: D/c Ibuprofen . Continue Protonix  40 mg daily and Pepcid  40mg  nightly; was on reglan  at home, has PRN compazine   here. Carafate  1g BID.  -- Followed by [facility].  No complaints while at rehab   18. Abnormal LFTs: Normal liver parenchyma and GB sludge noted on abdominal ultrasound 07/16/23.  --Resolving. Recheck in am-pending - 07/23/23 LFTs a bit improved 4/26, repeat CMP tomorrow morning; of note, HepB SAb reactive, core Ab positive 4-28: LFTs looking better 4/12 improved   19. Peripheral polyneuropathy: Cymbalta  resumed 04/23 as CK trending down. Will check weekly -- See #3 above; may benefit from transition to Lyrica or trial of Qutenza as outpatient.  Was establishing with Dr. Urbano   18. Anemia of chronic disease: Likely malabsorption w/ Low iron but elevated TIBC/Ferritin -07/23/23 Hgb 9.9 on Fri, repeat weekly on Mon/Thurs; iron studies done 4/21 showing low TIBC 172, iron 46, ferritin elevated 791.  -5/8 HGB overall stable 8.5 -5/15 stable at 9.0   19. Morbid obesity: BMI 48. Followed by RD/Bariatric nutrition for wt loss. Plan for Bariatric surgery 08/2023.              --Now on aggressive vitamin supplementation as above   20. Hyponatremia: Na down to 132-->question due to IVIG 4/31-4/24.  --May see rise in LFTs again.  -4/25 recheck labs still pending, will ask nursing to call to check on this -07/23/23 Na 131 on 4/26, recheck tomorrow to see trend or need for supplementation  4/28: NA stable 132. 5/1: Na 134, stable>> 3.1 5/2; monitor  5/12 stable at 139 5/15 stable  137   21. Severe tricuspid regurgitation/grade 1 diastolic heart failure              - 4/29: add daily weights with diuretic adjustments--stable -5/2: Weights remain stable, no external edema with DC hydrochlorothiazide .  Continue to monitor closely. -07/30/23 wt down significantly today, wonder if it's inaccurate; monitor trend -5/16 weights overall stable continue to monitor      Filed Weights    08/08/23 0356 08/09/23 0500 08/10/23 1500  Weight: (!) 144 kg 65.2 kg (!) 144 kg    22. Gout: continue  allopurinol  100mg  QD; off colchicine  for now, resume as appropriate    23. Diarrhea/constipation: -07/23/23 pt now having runny stools, thinks it's the food; stop miralax  daily and change to PRN; monitor 4-28: Ongoing liquid stool; encourage p.o. fluids, replete electrolytes as above, and adding fibercon supplemen\t              - 4/29: No Bms today; slowing down. Montior - 4-30: Diarrhea followed by smears, KUB performed to ensure no overflow incontinence, was normal. 5/1: KUB normal, patient feels she is having adequate bowel movements.             Last bowel movement 5-2, adequate per patient             LBM 5/7, consider additional medication if no BM by tomorrow -08/05/23 LBM 2 days ago per pt, 3 days ago per documentation; will see if she has BM today but if not then will start meds. Encouraged to use miralax  today  -5/13 LBM today continue to follow Consider additional medication if no BM today, sorbitol  as needed 24. Dysuria:             -07/29/23 pt stating some dysuria/difficulty with urination; ordered U/A -07/30/23 U/A not done yesterday d/t BMs everytime she urinated; will try urinal today; f/up  on results  -5/5/5 U/A 5/3 with 21-50 WBC, many bacteria start kelfex for suspected UTI, culture added 08/04/23 symptoms have improved, Proteus on urine culture, change to Augmentin  and DC keflex  -08/05/23 got 3.5 days of keflex , so augmentin  through 5/14  Symptoms have resolved   25. Anxiety             -xanax  0.25mg  PRN             -5/7 BuSpar  3 times daily 5 mg             5/14 anxiety appears to be doing better, continue current regimen, infrequent use of Xanax 

## 2023-10-16 NOTE — Telephone Encounter (Signed)
 [place] , [facility]  Advanced Heart Failure and Transplant  Heart Transplant Clinic  Follow-up Visit    Primary Care Physician: Windy Oneil BRAVO  Referring Provider: Hosey Eva Neri  Date of Transplant: 05/17/2019  Organ(s) Transplanted: heart  Indication for transplant: Dilated Myopathy: Idiopathic  PHS increased risk donor: Yes    ID. 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    Interval History:    The patient was last seen on 07/12/21. At that time issues with pain after urologic procedure.    He continues to deal with pain issue largely from prostate surgery. He still has some bleeding and some tissue come out. He tried different strategies and nothing helped. This is really impacting quality of life. He gets tired and frustrated and does not want to take it out on his family.    ROS:  A complete ROS was performed and is negative except as documented in the HPI.      Allergies:  Patient is allergic to cats [other] and dogs [other].    Past Medical History:   Diagnosis Date    Asthma     Atrial fibrillation (CMS-HCC)     Chronic HFrEF (heart failure with reduced ejection fraction) (CMS-HCC)     GERD (gastroesophageal reflux disease)     HTN (hypertension)     Insomnia     Nephrolithiasis     Sinusitis      Patient Active Problem List   Diagnosis    COPD (chronic obstructive pulmonary disease) (CMS-HCC)    Heart transplant, orthotopic, 05/17/2019    Pericardial effusion    Hypertension    Chronic back pain    At risk for infection transmitted from donor    Acute hepatitis C virus infection    Heart transplanted (CMS-HCC)    Acute UTI    Umbilical hernia without obstruction and without gangrene    COVID-19 virus detected    Acute medial meniscus tear of left knee, sequela    Localized osteoarthritis of left knee     Past Surgical History:   Procedure Laterality Date    CARDIAC DEFIBRILLATOR PLACEMENT       PB ANESTH,SHOULDER JOINT,NOS Right      Family History   Problem Relation Name Age of Onset    Hypertension Other      Other Maternal Grandmother          kidney disease needing HD     Social History     Socioeconomic History    Marital status: Single     Spouse name: Not on file    Number of children: Not on file    Years of education: Not on file    Highest education level: Not on file   Occupational History    Not on file   Tobacco Use    Smoking status: Never    Smokeless tobacco: Never    Tobacco comments:     from friends and relatives    Substance and Sexual Activity    Alcohol use: Not Currently     Comment: Prior heavier use, but completely quit in 2016    Drug use: Yes     Comment: eats edible marijuana for pain and insomnia     Sexual activity: Not on file   Other Topics Concern    Not on file   Social History Narrative  Born in [address], also lived in [place], [place], [place]. 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 Insecurity: Not on file   Transportation Needs: Not on file   Physical Activity: Not on file   Stress: Not on file   Social Connections: Not on file   Intimate Partner Violence: Not on file   Housing Stability: Not on file     Current Outpatient Medications   Medication Sig    albuterol 108 (90 Base) MCG/ACT inhaler Inhale 2 puffs by mouth every 4 hours as needed for Wheezing or Shortness of Breath.    aspirin 81 MG EC tablet Take 1 tablet (81 mg) by mouth daily.    baclofen (LIORESAL) 10 MG tablet Take 2 tablets (20 mg) by mouth nightly.    Blood Glucose Monitoring Suppl (TRUE METRIX METER) w/Device KIT Use as directed    budesonide-formoterol (SYMBICORT) 160-4.5 MCG/ACT inhaler Inhale 2 puffs by mouth every 12 hours.    bumetanide (BUMEX) 1 MG tablet Take 1 tablet (1 mg) by mouth daily as needed (fluid/weight  gain). Do not take unless instructed by Transplant team.    Calcium Carb-Cholecalciferol 600-10 MG-MCG TABS Take 1 tablet by mouth 2 times daily.    Cetirizine HCl (ZERVIATE) 0.24 % SOLN Place 1 drop into both eyes 2 times daily.    clindamycin  (CLEOCIN  T) 1 % solution Apply 1 Application. topically 2 times daily. Apply to the red bumps on your face up to two times a day.    controlled substance agreement controlled substance agreement    diclofenac (VOLTAREN) 1 % gel Apply 2 g topically 4 times daily.    docusate sodium (COLACE) 100 MG capsule Take 1 capsule (100 mg) by mouth 2 times daily.    DULoxetine (CYMBALTA) 30 MG CR capsule Take 1 capsule (30 mg) by mouth daily.    famotidine  (PEPCID ) 20 MG tablet Take 1 tablet (20 mg) by mouth 2 times daily.    fluticasone propionate (FLONASE) 50 MCG/ACT nasal spray Spray 1 spray into each nostril 2 times daily.    gabapentin (NEURONTIN) 300 MG capsule Take 1 capsule (300 mg) by mouth every morning AND 1 capsule (300 mg) daily AND 2 capsules (600 mg) every evening.    hydroCHLOROthiazide (HYDRODIURIL) 25 MG tablet Take 1 tablet (25 mg) by mouth daily.    ketoconazole  (NIZORAL ) 2 % shampoo Use shampoo daily for dandruff    lidocaine  (LIDOCAINE  PAIN RELIEF) 4 % patch Apply 1 patch topically every 24 hours. Leave patch on for 12 hours, then remove for 12 hours.    lisinopril (PRINIVIL, ZESTRIL) 10 MG tablet Take 2 tablets (20 mg) by mouth daily.    magnesium oxide (MAG-OX) 400 MG tablet Take 1 tablet by mouth daily    melatonin (GNP MELATONIN MAXIMUM STRENGTH) 5 MG tablet Take 2 tablets (10 mg) by mouth at bedtime.    Multiple Vitamin (MULTIVITAMIN) TABS tablet Take 1 tablet by mouth daily.    naloxone (KLOXXADO) 8 mg/0.1 mL nasal spray Call 911! Tilt head and spray intranasally into one nostril as needed for respiratory depression. If patient does not respond or responds and then relapses, repeat using a new nasal spray every 3 minutes until emergency medical assistance  arrives.    NEEDLE, DISP, 25 G 25G X  1" MISC Use to inject testosterone    NIFEdipine (ADALAT CC) 30 MG Controlled-Release tablet Take 1 tablet (30 mg) by mouth nightly.    ondansetron  (ZOFRAN ) 8 MG tablet Take 1 tablet (8 mg) by mouth every 8 hours as needed for Nausea/Vomiting.    oxyCODONE (ROXICODONE) 10 MG tablet Take 1 tab every 4 hours as needed for moderate pain and 2 tabs every 4 hours as needed for severe pain. Max 10 tabs per day, 28 day supply    phenazopyridine  (PYRIDIUM ) 100 MG tablet Take 1 tablet (100 mg) by mouth 3 times daily.    polyethylene glycol (GLYCOLAX) 17 GM/SCOOP powder Mix 17 grams in 4-8 oz of liquide and drink by mouth daily as needed (Constipation).    pravastatin (PRAVACHOL) 40 MG tablet Take 1 tablet (40 mg) by mouth every evening.    senna (SENOKOT) 8.6 MG tablet Take 1 tablet (8.6 mg) by mouth daily.    sirolimus (RAPAMUNE) 1 MG tablet Take 2 tablets (2 mg) by mouth every morning.    SYRINGE-NEEDLE, DISP, 3 ML (B-D 3CC LUER-LOK SYR 25GX1") 25G X 1" 3 ML MISC Use as directed to inject testosterone    SYRINGE-NEEDLE, DISP, 3 ML 18G X 1-1/2" 3 ML MISC Use to draw up testosterone    tacrolimus (ENVARSUS XR) 1 MG tablet STOP TAKING since 12/08/21 - remaining on chart for dose adjustments, titratable med.    tacrolimus (ENVARSUS XR) 4 MG tablet Take 1 tablet (4 mg) by mouth every morning.    tamsulosin (FLOMAX) 0.4 MG capsule Take 1 capsule (0.4 mg) by mouth daily.    tamsulosin (FLOMAX) 0.4 MG capsule Take 1 capsule (0.4 mg) by mouth daily.    testosterone cypionate (DEPO-TESTOSTERONE) 200 MG/ML SOLN Inject 1 ml into the muscle every 14 days    traZODone (DESYREL) 50 MG tablet Take 1 tablet (50 mg) by mouth nightly.     Current Facility-Administered Medications   Medication    diphenhydrAMINE (BENADRYL) injection 50 mg    diphenhydrAMINE (BENADRYL) tablet 50 mg     Immunization History   Administered Date(s) Administered    COVID-19 (Moderna) Low Dose Red Cap >= 18 Years 05/08/2020     COVID-19 (Moderna) Red Cap >= 12 Years 06/15/2019, 07/15/2019, 11/13/2019    Hep-A/Hep-B; Twinrix, Adult 06/08/2020    Influenza Vaccine (High Dose) Quadrivalent >=65 Years 01/29/2020    Influenza Vaccine (Unspecified) 11/26/2016    Influenza Vaccine >=6 Months 02/08/2010, 02/08/2011, 04/05/2012, 01/01/2014, 12/15/2017, 12/31/2018    Pneumococcal 13 Vaccine (PREVNAR-13) 01/29/2020    Pneumococcal 23 Vaccine (PNEUMOVAX-23) 02/08/2013, 06/08/2020    Tdap 03/29/2011   Deferred Date(s) Deferred    Pneumococcal 23 Vaccine (PNEUMOVAX-23) 05/30/2019     Physical Exam:  BP 102/69 (BP Location: Right arm, BP Patient Position: Sitting, BP cuff size: Large)   Pulse 98   Temp 98.5 F (36.9 C) (Temporal)   Resp 16   Ht 5\' 10"  (1.778 m)   Wt 96.2 kg (212 lb)   SpO2 97%   BMI 30.42 kg/m      General Appearance: ***alert, no distress, pleasant affect, cooperative.  Heart:  JVD ***, PMI ***, normal rate and regular rhythm, no murmurs, clicks, or gallops. ***  Lungs: ***clear to auscultation and percussion. No rales, rhonchi, or wheezes noted. No chest deformities noted.  Abdomen: ***BS normal.  Abdomen soft, non-tender.  No masses or organomegaly.  Extremities:  ***no cyanosis, clubbing, or edema. Has 2+ peripheral pulses.  Lab Data:  Lab Results   Component Value Date    BUN 26 (H) 12/08/2021    CREAT 1.98 (H) 12/08/2021    CL 99 12/08/2021    NA 140 12/08/2021    K 4.4 12/08/2021    [place] 9.2 12/08/2021    TBILI 0.47 12/08/2021    ALB 4.1 12/08/2021    TP 7.1 12/08/2021    AST 22 12/08/2021    ALK 76 12/08/2021    BICARB 29 12/08/2021    ALT 25 12/08/2021    GLU 126 (H) 12/08/2021     Lab Results   Component Value Date    WBC 7.9 12/08/2021    RBC 5.50 12/08/2021    HGB 15.2 12/08/2021    HCT 46.5 12/08/2021    MCV 84.5 12/08/2021    MCHC 32.7 12/08/2021    RDW 12.3 12/08/2021    PLT 162 12/08/2021    MPV 11.6 12/08/2021     Lab Results   Component Value Date    A1C 5.7 05/07/2021     Lab Results   Component Value Date     TSH 1.63 05/07/2021     Lab Results   Component Value Date    CHOL 105 05/07/2021    HDL 38 05/07/2021    LDLCALC 43 05/07/2021    TRIG 121 05/07/2021     Lab Results   Component Value Date    SIROT 11.5 12/08/2021     Lab Results   Component Value Date    FKTR 6.5 12/08/2021     No results found for: CSATR  Lab Results   Component Value Date    CMVPL Not Detected 02/26/2021     Lab Results   Component Value Date    DSA ABSENT 12/08/2021       Prior Cardiovascular Studies:   Lab Results   Component Value Date    LV Ejection Fraction 59 06/03/2021          Echo 06/03/21  Summary:   1. The left ventricular size is normal. The left ventricular systolic function is normal.   2. No left ventricular hypertrophy.   3. Normal pattern of left ventricular diastolic filling.   4. EF=59%.   5. Compared to prior study EF now 59%, was 69% 06/26/20.     LHC/IVUS 06/01/21  CONCLUSION:                                                                   1. 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                              2. No angiographic evidence of coronary artery disease.                      3. Intimal thickness noted in LAD/LM up to 0.5 mm (Stable to slightly       worse compare to 2022).  4. Non significant FFR at apical LAD.                                        5. Left ventricular end diastolic pressure appears normal.        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    Assessment/Plan:  # Hematuria  # Dysuria  # Chronic pain  Assessment: We had a long frank discussion about patient's chronic pain issues and the heart transplant team's role in this. I discussed with him that when I initially agreed to cover his chronic opiate prescription, this was the assumption that he would  have a provider versed in chronic pain after 3-4 months, but we are at 6 months and has unable to find one. Additionally, I had not put him on a pain contract at that time, but he recently used more opiates without asking and I informed him this was not appropriate, but because he had not established guidelines I was not going to stop at this time. However, going forward until he can establish with a pain physician, we will set up a pain contract and he will need to follow through like a usual pain clinic with us  with goal of provider in 3-4 months or I Laquinton Bihm start tapering. I will augment adjuvant agents additionally for now and we can continue to work on this.  Plan:  -pain contract signed  -urine tox monthly  -clinic follow up month  -oxycodone 10 mg tablets PO, 1 tab every 4 hours moderate pain, 2 tabs every 4 hours for severe pain, no more than 10 tablets a day, total 280 per 28 days.   -diclofenac cream for joint pain  -lidocaine  patch for back pain  -trial of pyridium   -increase gaba at night  -siro change as below  -cymbalta as below    # End-stage heart failure s/p orthotopic heart transplant  # Chronic Immunosuppression/Immunomodulation  Assessment: While we thought continuing sirolimus would help prevent recurrent scar tissue from prostate procedure, it Malgorzata Albert be exacerbating factors now with delayed wound healing. Will try mmf for 1 month.  Plan:   - continue envarsus 6 mg daily, goal trough 4-8  - HOLD sirolimus 3 mg daily, goal trough 4-8 for at least 1 month  - start mmf 1000 mg bid for one month to allow healing  - Continue to monitor for renal toxicities, infection risk and malignancy risk  - continue pravastatin 40 mg daily  - continue aspirin 81 mg daily    # Hypertension  Assessment: controlled  Plan:  -continue lisinopril 20 mg daily  -resume hctz  -nifedipine 30 mg daily    # Dyslipidemia  -continue pravastatin 40 mg daily    # Depression  Assessment: improved mood  Plan:  -increase cymbalta to 120  mg daily     RTC in 1 month       Mabel LELON Arrant, MD  Advanced Heart Failure, Mechanical Circulatory Support, Transplant  Pgr: [phone]

## 2023-10-17 ENCOUNTER — Encounter: Payer: Self-pay | Admitting: Physical Therapy

## 2023-10-17 ENCOUNTER — Ambulatory Visit: Admitting: Physical Therapy

## 2023-10-17 VITALS — BP 116/84 | HR 66

## 2023-10-17 DIAGNOSIS — G63 Polyneuropathy in diseases classified elsewhere: Secondary | ICD-10-CM

## 2023-10-17 DIAGNOSIS — R208 Other disturbances of skin sensation: Secondary | ICD-10-CM

## 2023-10-17 DIAGNOSIS — R2681 Unsteadiness on feet: Secondary | ICD-10-CM

## 2023-10-17 DIAGNOSIS — R29818 Other symptoms and signs involving the nervous system: Secondary | ICD-10-CM | POA: Diagnosis not present

## 2023-10-17 DIAGNOSIS — M25562 Pain in left knee: Secondary | ICD-10-CM | POA: Diagnosis not present

## 2023-10-17 DIAGNOSIS — R2689 Other abnormalities of gait and mobility: Secondary | ICD-10-CM

## 2023-10-17 DIAGNOSIS — M6281 Muscle weakness (generalized): Secondary | ICD-10-CM | POA: Diagnosis not present

## 2023-10-17 DIAGNOSIS — Q998 Other specified chromosome abnormalities: Secondary | ICD-10-CM | POA: Diagnosis not present

## 2023-10-17 DIAGNOSIS — G629 Polyneuropathy, unspecified: Secondary | ICD-10-CM | POA: Diagnosis not present

## 2023-10-17 MED ORDER — CYANOCOBALAMIN 1000 MCG/ML IJ SOLN: 1000.0000 ug | INTRAMUSCULAR | 0 refills | Status: AC

## 2023-10-17 NOTE — Telephone Encounter (Signed)
 Patient calls nurse line to schedule lab visit.   Lab visit scheduled for tomorrow morning.   Future orders will need to be placed.   Forwarding to PCP for order placement.   Chiquita JAYSON English, RN

## 2023-10-17 NOTE — Therapy (Signed)
 OUTPATIENT PHYSICAL THERAPY NEURO TREATMENT  Patient Name: Tiffany Velasquez MRN: [id number] DOB:08-25-1995, 28 y.o., female Today's Date: 10/17/2023   PCP: Madelon Donald HERO, DO  REFERRING PROVIDER: Pegge Toribio PARAS, PA-C    END OF SESSION:  PT End of Session - 10/17/23 1059     Visit Number 10    Number of Visits 17    Date for PT Re-Evaluation 10/28/23    Authorization Type [employer] MEDICARE    PT Start Time 1102    PT Stop Time 1145    PT Time Calculation (min) 43 min    Equipment Utilized During Treatment Gait belt    Activity Tolerance Patient tolerated treatment well    Behavior During Therapy WFL for tasks assessed/performed          Past Medical History:  Diagnosis Date   AKI (acute kidney injury) (HCC) 07/08/2023   Asthma    Eczema    Glaucoma    Hypertension    Morbid obesity (HCC)    Pneumonia of both lungs due to infectious organism 07/19/2023   Tachycardia  Multifocal PNA 07/14/2023   Transaminitis 07/15/2023   Uveitic glaucoma of both eyes, 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   Past Surgical History:  Procedure Laterality Date   BIOPSY  04/04/2023   Procedure: BIOPSY;  Surgeon: Lyndel Deward PARAS, MD;  Location: [facility];  Service: General;;   CATARACT EXTRACTION     ESOPHAGOGASTRODUODENOSCOPY N/A 04/04/2023   Procedure: ESOPHAGOGASTRODUODENOSCOPY (EGD);  Surgeon: Lyndel Deward PARAS, MD;  Location: [facility];  Service: General;  Laterality: N/A;   PORTA CATH INSERTION     Patient Active Problem List   Diagnosis Date Noted   Adjustment disorder with mixed anxiety and depressed mood 07/24/2023   Myositis associated antibody positive 07/20/2023   Vitamin A  deficiency 07/19/2023   Vitamin D  deficiency 07/18/2023   Anemia 07/16/2023   Urinary retention 07/16/2023   Vitamin B12 deficiency 07/16/2023   Peanut  allergy  07/15/2023   Port-A-Cath in place 07/15/2023   Immunosuppression due to drug therapy (HCC) 07/15/2023   Acetazolamide  Therapy  07/15/2023   Highly Elevated C-reactive protein (CRP) 07/15/2023   Folate deficiency 07/15/2023   Elevated sedimentation rate measurement 07/15/2023   Elevated CK 07/15/2023   Opioid Therapy 07/15/2023   Chorioretinal inflammation of both eyes 07/15/2023   mild Glaucoma, steroid induced 07/15/2023   Unintentional weight loss of 10% body weight within 6 months 07/15/2023   Lower extremity weakness, bilateral 07/14/2023   Elevated liver enzymes 07/09/2023   Paresthesia 07/08/2023   Myositis of left lower extremity 07/08/2023   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) 06/05/2023   Gout 05/29/2023   Epigastric abdominal tenderness without rebound tenderness 05/10/2023   Gastroesophageal reflux disease 11/08/2022   Iritis 07/26/2018   Seasonal and perennial allergic rhinitis 07/26/2018   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   Retinal edema 07/13/2018   Not well controlled moderate persistent asthma 01/16/2017   Hypertension 06/22/2012   Morbid obesity (HCC) 10/05/2009   Severe eczema 05/25/2006    ONSET DATE: 08/11/2023  REFERRING DIAG: M60.9 (ICD-10-CM) - Myositis 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 G63 (ICD-10-CM) - Polyneuropathy in diseases classified elsewhere    THERAPY DIAG:  Unsteadiness on feet  Left knee pain, unspecified chronicity  Muscle weakness (generalized)  Other abnormalities of gait and mobility  Other disturbances of skin sensation  Other symptoms and  signs involving the nervous system  Neuropathy  Polyneuropathy associated with underlying disease (HCC)  Rationale for Evaluation and Treatment: Rehabilitation  SUBJECTIVE:                                                                                                                                                                                              SUBJECTIVE STATEMENT: Pt presents to PT treatment session with no AD and currently wearing Crocs in sports mode. Pt reports her balance is continuing to improve. Pt has noticed she's been able to stand up for longer periods of time without having to sit down. Pt reports it's been getting easier for her to dress herself with her balance as well.   Pt states she is not in any pain currently. Pt is planning to start with neuropathy patches starting September 15th. Pt states HEP has been going good. Denies any falls / close calls.  Pt accompanied by: self  PERTINENT HISTORY: HTN, focal chorioretinal inflammation-managed with IVIG monthly, borderline glaucoma and retinal edema, multiple vitamin deficiencies, chronic bilateral foot pain, gout, abnormal immunoglobulins, morbid obesity-BMI 48 was working on weight loss with dietitian in preparation for bariatric surgery, polyneuropathy   She reported multiple falls in 2 weeks and was sent to ED for evaluation. She was found to have low folate, low vitamin D  and low vitamin A  levels, elevated CRP, low B1 less than 6. MRI brain and spine without acute abnormality. 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 No.   PRECAUTIONS: Fall   PATIENT GOALS: Wants to be more independent, feels like she is still teetering some when she is walking. 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, site in ~1 month     Therapeutic Activity: 6 rounds of floor>stand transfers using red mat - supervision Pt initially required verbal cues to elicit maximum LE power production from tall kneeling>stand with good carryover Pt challenged with tall kneeling>stand, requiring consistent bilat UE support from mat Pt verbalized supported surface at school is raised higher  Less PT instruction>no PT instruction required with increased reps  3 rounds of 4 blaze pods with distraction component on mirror at the ballet bars with mini squats on foam pad - CGA RPE: 7/10 Round one: 12 hits; round two: 14 hits; round three: 15 hits  No UE support required Pt was able to deepen squat position with proper form and technique  Moderate cues required upright posture and slow, controlled movement with good carryover Pt challenged with LE muscular endurance as fatigued ensued with higher rep count; pt exhibited anterior pelvic tilt, resulting weight shift to toes and slight LOB PT administered ~1 minute sitting rest/water break in between each set to allow for proper recovery  PATIENT EDUCATION: Education details: see above, update HEP as needed, continue aerobic activity Person educated: Patient Education method: Explanation, Demonstration, and Verbal cues Education comprehension: verbalized understanding  HOME EXERCISE PROGRAM: Access Code: [email or web address] URL: [email or web address] Date: 08/15/2023 Prepared by: Reche Ohara   Exercises - Supine Bridge  - 1 x daily - 7 x  weekly - 1 sets - 5 reps - Straight Leg Raise  - 1 x daily - 7 x weekly - 1 sets - 5 reps - Single Leg Bridge  - 1 x daily - 7 x weekly - 1 sets - 5 reps - Supine 90/90 Alternating Toe Touch One Leg at a Time  - 1 x daily - 7 x weekly - 1 sets - 5 reps - Transition from belly onto hands and knees  - 1 x daily - 7 x weekly - 1 sets - 5 reps - Sit to Stand with Armchair  - 1 x daily - 7 x weekly - 1 sets - 5 reps - Standing Marching  - 1 x daily - 7 x weekly - 1 sets - 20 reps  From inpatient rehab, will revise/update as appropriate   GOALS: Goals reviewed with patient? Yes  SHORT TERM GOALS: Target date: 09/26/2023  Pt will be independent with initial HEP for strength, gait, balance in order to build upon functional gains made in therapy Baseline: pt reports she does them regularly in the morning and does not have any issues Goal status: MET  2.  BERG to be assessed with LTG written. Baseline: 40/56 51/56 (10/03/23) Goal status: MET  3.  Pt will improve gait speed with SPC to at least 2.6 ft/sec in order to demo improved community mobility.   Baseline: Gait speed with SPC: 14.7 seconds = 2.23 ft/sec  11.90 seconds = 2.76 ft/sec (10/03/23) Goal status: MET  4.  TUG to be assessed with STG/LTG written.  Baseline: LTG written Goal status: MET  5.  Pt will improve 5x sit<>stand to less than or equal to 22 sec to demonstrate improved functional strength and transfer efficiency.  Baseline: 26.4 seconds with BUE support  22  seconds without UE support (10/03/23) Goal status: MET   LONG TERM GOALS: Target date: 10/24/2023  Pt will be independent with final HEP for strength, gait, balance in order to build upon functional gains made in therapy Baseline:  Goal status: INITIAL  2.  Pt will improve BERG to at least a 46/56 in order to demo decr fall risk. Baseline: 40/56 51/56 (10/03/23) Goal status: MET  3.  Pt will improve gait speed with SPC vs. No AD to at least 3.1 ft/sec in order to  demo improved community mobility.  Baseline: Gait speed with SPC: 14.7 seconds = 2.23 ft/sec  11.90 seconds = 2.76 ft/sec (10/03/23) Goal status: INITIAL  4.  Pt will improve TUG time to 12 seconds or less with no AD in order to demo decrease fall risk.  Baseline: 13.4 seconds with SPC 11.72 seconds with no AD Goal status: MET  5.   Pt will improve 5x sit<>stand to less than or equal to 18 sec with no UE support to demonstrate improved functional strength and transfer efficiency.  Baseline:  22 seconds without UE support (10/03/23) Goal status: INITIAL  6.  Pt will ambulate at least 500' outdoors over unlevel surfaces with LRAD and mod I for improved community mobility.  Baseline:  Goal status: INITIAL  7.  Pt will improve FGA to at least a 20/30 in order to demo decr fall risk.  Baseline: 17/30 (10/06/23) Goal status: INITIAL  ASSESSMENT:  CLINICAL IMPRESSION: Pt seen for skilled PT treatment session with an emphasis on floor>stand transfer training and continued progressive functional bilat UE/LE muscular strength/endurance therapeutic activity. Pt continues to progress towards all goals set at initial evaluation. Floor>stand transfers to simulate the school environment as pt returns to work as a Engineer, site in ~1 month were performed with success. Pt exhibited great carryover with PT instruction and verbal cues given in previous treatment session - demonstrated in improved motor control in mini squats on foam pad. Pt continues to benefit from PT instructions and demonstrations prior to engaging in therapeutic activity. Pt tolerated today's session well and put forth amazing effort for the entirety of the session with no reports of pain. PT will continue to address the aforementioned deficits to improve confidence in performing ADLs and improve QoL. Continue POC.   OBJECTIVE IMPAIRMENTS: Abnormal gait, decreased activity tolerance, decreased balance, decreased endurance, decreased  mobility, difficulty walking, decreased strength, impaired flexibility, impaired sensation, postural dysfunction, and pain.   ACTIVITY LIMITATIONS: standing, squatting, stairs, transfers, locomotion level, and caring for others  PARTICIPATION LIMITATIONS: driving, shopping, community activity, occupation, and yard work  PERSONAL FACTORS: Behavior pattern, Past/current experiences, Time since onset of injury/illness/exacerbation, and 3+ comorbidities: HTN, focal chorioretinal inflammation-managed with IVIG monthly, borderline glaucoma and retinal edema, multiple vitamin deficiencies, chronic bilateral foot pain, gout, abnormal immunoglobulins, morbid obesity-BMI 48 was working on weight loss with dietitian in preparation for bariatric surgery, polyneuropathy  are also affecting patient's functional outcome.   REHAB POTENTIAL: Good  CLINICAL DECISION MAKING: Evolving/moderate complexity  EVALUATION COMPLEXITY: Moderate  PLAN:  PT FREQUENCY: 2x/week  PT DURATION: 8 weeks   PLANNED INTERVENTIONS: [id number]- PT Re-evaluation, 97110-Therapeutic exercises, 97530- Therapeutic activity, 97112- Neuromuscular re-education, 97535- Self Care, [id number]- Manual therapy, [phone]- Gait training, [phone]- Aquatic Therapy, Patient/Family education, Balance training, Stair training, Vestibular training, Cognitive remediation, and DME instructions  PLAN FOR NEXT SESSION: re-cert to d/c or add visits?, update HEP as needed; monitor appropriate therex and therapeutic activity prescription through RPE, administer rest breaks PRN;  continue with targeted functional global UE/LE strengthening, progress stair training when indicated, work on high-functioning dynamic balance   Waddell Nailer, Student-PT  10/17/2023, 11:13 AM

## 2023-10-18 ENCOUNTER — Encounter: Payer: Self-pay | Admitting: Physical Medicine & Rehabilitation

## 2023-10-18 ENCOUNTER — Other Ambulatory Visit

## 2023-10-18 DIAGNOSIS — G63 Polyneuropathy in diseases classified elsewhere: Secondary | ICD-10-CM | POA: Diagnosis not present

## 2023-10-18 DIAGNOSIS — E539 Vitamin B deficiency, unspecified: Secondary | ICD-10-CM | POA: Diagnosis not present

## 2023-10-19 ENCOUNTER — Ambulatory Visit

## 2023-10-19 DIAGNOSIS — H209 Unspecified iridocyclitis: Secondary | ICD-10-CM | POA: Diagnosis not present

## 2023-10-19 DIAGNOSIS — H4043X4 Glaucoma secondary to eye inflammation, bilateral, indeterminate stage: Secondary | ICD-10-CM | POA: Diagnosis not present

## 2023-10-19 LAB — VITAMIN B12: Vitamin B-12: 1035 pg/mL (ref 232–1245)

## 2023-10-20 ENCOUNTER — Ambulatory Visit

## 2023-10-20 DIAGNOSIS — M6281 Muscle weakness (generalized): Secondary | ICD-10-CM | POA: Diagnosis not present

## 2023-10-20 DIAGNOSIS — R208 Other disturbances of skin sensation: Secondary | ICD-10-CM | POA: Diagnosis not present

## 2023-10-20 DIAGNOSIS — R2689 Other abnormalities of gait and mobility: Secondary | ICD-10-CM

## 2023-10-20 DIAGNOSIS — Q998 Other specified chromosome abnormalities: Secondary | ICD-10-CM | POA: Diagnosis not present

## 2023-10-20 DIAGNOSIS — M25562 Pain in left knee: Secondary | ICD-10-CM | POA: Diagnosis not present

## 2023-10-20 DIAGNOSIS — R29818 Other symptoms and signs involving the nervous system: Secondary | ICD-10-CM | POA: Diagnosis not present

## 2023-10-20 DIAGNOSIS — G63 Polyneuropathy in diseases classified elsewhere: Secondary | ICD-10-CM | POA: Diagnosis not present

## 2023-10-20 DIAGNOSIS — G629 Polyneuropathy, unspecified: Secondary | ICD-10-CM | POA: Diagnosis not present

## 2023-10-20 DIAGNOSIS — R2681 Unsteadiness on feet: Secondary | ICD-10-CM

## 2023-10-20 NOTE — Therapy (Signed)
 OUTPATIENT PHYSICAL THERAPY NEURO TREATMENT/ DISCHARGE SUMMARY   Patient Name: Tiffany Velasquez MRN: [id number] DOB:1995/12/01, 28 y.o., female Today's Date: 10/20/2023   PCP: Madelon Donald HERO, DO  REFERRING PROVIDER: Pegge Toribio PARAS, PA-C   PHYSICAL THERAPY DISCHARGE SUMMARY  Visits from Start of Care: 11  Current functional level related to goals / functional outcomes: See below   Remaining deficits: Fall risk, limited endurance   Education / Equipment: PT POC, exam findings, HEP, fall precautions, energy conservation   Patient agrees to discharge. Patient goals were partially met. Patient is being discharged due to meeting the stated rehab goals.  END OF SESSION:  PT End of Session - 10/20/23 1015     Visit Number 11    Number of Visits 17    Date for PT Re-Evaluation 10/28/23    Authorization Type [employer] MEDICARE    PT Start Time 1015    PT Stop Time 1053    PT Time Calculation (min) 38 min    Equipment Utilized During Treatment Gait belt    Activity Tolerance Patient tolerated treatment well    Behavior During Therapy WFL for tasks assessed/performed          Past Medical History:  Diagnosis Date   AKI (acute kidney injury) (HCC) 07/08/2023   Asthma    Eczema    Glaucoma    Hypertension    Morbid obesity (HCC)    Pneumonia of both lungs due to infectious organism 07/19/2023   Tachycardia  Multifocal PNA 07/14/2023   Transaminitis 07/15/2023   Uveitic glaucoma of both eyes, 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   Past Surgical History:  Procedure Laterality Date   BIOPSY  04/04/2023   Procedure: BIOPSY;  Surgeon: Lyndel Deward PARAS, MD;  Location: [facility];  Service: General;;   CATARACT EXTRACTION     ESOPHAGOGASTRODUODENOSCOPY N/A 04/04/2023   Procedure: ESOPHAGOGASTRODUODENOSCOPY (EGD);  Surgeon: Lyndel Deward PARAS, MD;  Location: [facility];  Service: General;  Laterality: N/A;   PORTA CATH INSERTION     Patient Active Problem List    Diagnosis Date Noted   Adjustment disorder with mixed anxiety and depressed mood 07/24/2023   Myositis associated antibody positive 07/20/2023   Vitamin A  deficiency 07/19/2023   Vitamin D  deficiency 07/18/2023   Anemia 07/16/2023   Urinary retention 07/16/2023   Vitamin B12 deficiency 07/16/2023   Peanut  allergy  07/15/2023   Port-A-Cath in place 07/15/2023   Immunosuppression due to drug therapy (HCC) 07/15/2023   Acetazolamide  Therapy 07/15/2023   Highly Elevated C-reactive protein (CRP) 07/15/2023   Folate deficiency 07/15/2023   Elevated sedimentation rate measurement 07/15/2023   Elevated CK 07/15/2023   Opioid Therapy 07/15/2023   Chorioretinal inflammation of both eyes 07/15/2023   mild Glaucoma, steroid induced 07/15/2023   Unintentional weight loss of 10% body weight within 6 months 07/15/2023   Lower extremity weakness, bilateral 07/14/2023   Elevated liver enzymes 07/09/2023   Paresthesia 07/08/2023   Myositis of left lower extremity 07/08/2023   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) 06/05/2023   Gout 05/29/2023   Epigastric abdominal tenderness without rebound tenderness 05/10/2023   Gastroesophageal reflux disease 11/08/2022   Iritis 07/26/2018   Seasonal and perennial allergic rhinitis 07/26/2018   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   Retinal edema 07/13/2018   Not well controlled moderate persistent asthma 01/16/2017   Hypertension 06/22/2012   Morbid obesity (HCC)  10/05/2009   Severe eczema 05/25/2006    ONSET DATE: 08/11/2023  REFERRING DIAG: M60.9 (ICD-10-CM) - Myositis 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 Reports that her legs were a little sore after squats last session, but overall doing much better. Patient agreeable to dc from PT.   Pt accompanied by: self  PERTINENT HISTORY: HTN, focal chorioretinal inflammation-managed with IVIG monthly, borderline glaucoma and retinal edema, multiple vitamin deficiencies, chronic bilateral foot pain, gout, abnormal immunoglobulins, morbid obesity-BMI 48 was working on weight loss with dietitian in preparation for bariatric surgery, polyneuropathy   She reported multiple falls in 2 weeks and was sent to ED for evaluation. She was found to have low folate, low vitamin D  and low vitamin A  levels, elevated CRP, low B1 less than 6. MRI brain and spine without acute abnormality. 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 No.   PRECAUTIONS: Fall   PATIENT GOALS: Wants to be more independent, feels like she is still teetering some when she is walking. Wants to get more upper body and leg strength  TREATMENT:  [facility] PT  Assessment - 10/20/23 0001       Standardized Balance Assessment   Standardized Balance Assessment Timed Up and Go Test    Five times sit to stand comments  12.65s no UE    10 Meter Walk 2.59ft/s or .80m/s   no AD     Berg Balance Test   Sit to Stand Able to stand without using hands and stabilize independently    Standing Unsupported Able to stand safely 2 minutes    Sitting with Back Unsupported but Feet Supported on Floor or Stool Able to sit safely and securely 2 minutes    Stand to Sit Controls descent by using hands    Transfers Able to transfer safely, minor use of hands    Standing Unsupported with Eyes Closed Able to stand 10 seconds with supervision    Standing Unsupported with Feet Together Able to place feet together independently and stand for 1 minute with supervision    From Standing, Reach Forward with Outstretched Arm Can reach forward >12 cm safely (5)    From Standing Position, Pick up Object from Floor Able to pick up shoe, needs supervision    From Standing Position, Turn to Look Behind Over each Shoulder Looks behind from both sides and weight shifts well    Turn 360 Degrees Able to turn 360 degrees safely in 4 seconds or less    Standing Unsupported, Alternately Place Feet on Step/Stool Able to stand independently and complete 8 steps >20 seconds    Standing Unsupported, One Foot in Front Able to plae foot ahead of the other independently and hold 30 seconds    Standing on One Leg Able to lift leg independently and hold equal to or more than 3 seconds    Total Score 47      Timed Up and Go Test   Normal TUG (seconds) 13.5      Functional Gait  Assessment   Gait assessed  Yes    Gait Level Surface Walks 20 ft in less than 7 sec but greater than 5.5 sec, uses assistive device, slower speed, mild gait deviations, or deviates 6-10 in outside of the 12 in walkway width.    Change in Gait Speed Makes only minor adjustments to walking speed, or accomplishes a change in  speed with significant gait deviations, deviates 10-15 in outside the 12 in walkway width, or changes speed but loses balance but is able to recover and continue walking.    Gait with Horizontal Head Turns Performs head turns smoothly with slight change in gait velocity (eg, minor disruption to smooth gait path), deviates 6-10 in outside 12 in walkway width, or uses an assistive device.    Gait with Vertical Head Turns Performs task with slight change in gait velocity (eg, minor disruption to smooth gait path), deviates 6 - 10 in outside 12 in walkway width or uses assistive device    Gait and Pivot Turn Pivot turns safely within 3 sec and stops quickly with no loss of balance.    Step Over Obstacle Is able to step over 2 stacked shoe boxes taped together (9 in total height) without changing gait speed. No evidence of imbalance.    Gait with Narrow Base of Support Ambulates 7-9 steps.    Gait with Eyes Closed Walks 20 ft, uses assistive device, slower speed, mild gait deviations, deviates 6-10 in outside 12 in walkway width. Ambulates 20 ft in less than 9 sec but greater  than 7 sec.    Ambulating Backwards Walks 20 ft, slow speed, abnormal gait pattern, evidence for imbalance, deviates 10-15 in outside 12 in walkway width.   impacted by shoeware   Steps Alternating feet, must use rail.    Total Score 20           PATIENT EDUCATION: Education details: continue HEP, goal assessment, fall precautions, activity pacing in terms of return to school  Person educated: Patient Education method: Explanation, Demonstration, and Verbal cues Education comprehension: verbalized understanding  HOME EXERCISE PROGRAM: Access Code: [email or web address] URL: [email or web address] Date: 08/15/2023 Prepared by: Reche Ohara   Exercises - Supine Bridge  - 1 x daily - 7 x weekly - 1 sets - 5 reps - Straight Leg Raise  - 1 x daily - 7 x weekly - 1 sets - 5 reps - Single Leg Bridge  - 1 x daily - 7 x  weekly - 1 sets - 5 reps - Supine 90/90 Alternating Toe Touch One Leg at a Time  - 1 x daily - 7 x weekly - 1 sets - 5 reps - Transition from belly onto hands and knees  - 1 x daily - 7 x weekly - 1 sets - 5 reps - Sit to Stand with Armchair  - 1 x daily - 7 x weekly - 1 sets - 5 reps - Standing Marching  - 1 x daily - 7 x weekly - 1 sets - 20 reps  From inpatient rehab, will revise/update as appropriate   GOALS: Goals reviewed with patient? Yes  SHORT TERM GOALS: Target date: 09/26/2023  Pt will be independent with initial HEP for strength, gait, balance in order to build upon functional gains made in therapy Baseline: pt reports she does them regularly in the morning and does not have any issues Goal status: MET  2.  BERG to be assessed with LTG written. Baseline: 40/56 51/56 (10/03/23) Goal status: MET  3.  Pt will improve gait speed with SPC to at least 2.6 ft/sec in order to demo improved community mobility.   Baseline: Gait speed with SPC: 14.7 seconds = 2.23 ft/sec  11.90 seconds = 2.76 ft/sec (10/03/23) Goal status: MET  4.  TUG to be assessed with STG/LTG written.  Baseline: LTG written Goal status: MET  5.  Pt will improve 5x sit<>stand to less than or equal to 22 sec to demonstrate improved functional strength and transfer efficiency.  Baseline: 26.4 seconds with BUE support  22 seconds without UE support (10/03/23) Goal status: MET   LONG TERM GOALS: Target date: 10/24/2023  Pt will be independent with final HEP for strength, gait, balance in order to build upon functional gains made in therapy Baseline: provided Goal status: MET  2.  Pt will improve BERG to at least a 46/56 in order to demo decr fall risk. Baseline: 40/56 51/56 (10/03/23); 47/56 Goal status: MET  3.  Pt will improve gait speed with SPC vs. No AD to at least 3.1 ft/sec in order to demo improved community mobility.  Baseline: Gait speed with SPC: 14.7 seconds = 2.23 ft/sec  11.90 seconds = 2.76  ft/sec (10/03/23); 2.45ft/s or .35m/s Goal status: NOT MET  4.  Pt will improve TUG time to 12 seconds or less with no AD in order to demo decrease fall risk.  Baseline: 13.4 seconds with SPC 11.72 seconds with no AD Goal status: MET  5.   Pt will improve 5x sit<>stand to less than or equal to  18 sec with no UE support to demonstrate improved functional strength and transfer efficiency.  Baseline:  22 seconds without UE support (10/03/23); 12.65s no UE Goal status: MET  6.  Pt will ambulate at least 500' outdoors over unlevel surfaces with LRAD and mod I for improved community mobility.  Baseline: self-reported Goal status: MET  7.  Pt will improve FGA to at least a 20/30 in order to demo decr fall risk.  Baseline: 17/30 (10/06/23); 20/30 Goal status: MET  ASSESSMENT:  CLINICAL IMPRESSION: Patient seen for skilled PT session with emphasis on goal assessment and dc. She met 6/7 LTG and made good progress toward remaining goal. Patient demonstrated increased fall risk noted by score of 47/56 on the [facility] Balance Scale.  <45/56 = fall risk, <42/56 = predictive of recurrent falls, <40/56 = 100% fall risk  >41 = independent, 21-40 = assistive device, 0-20 = wheelchair level  MDC 6.9 (4 pts 45-56, 5 pts 35-44, 7 pts 25-34) (ANPTA Core Set of Outcome Measures for Adults with Neurologic Conditions, 2018). Patient demonstrates increased fall risk as noted by score of 20/30 on  Functional Gait Assessment.   <22/30 = predictive of falls, <20/30 = fall in 6 months, <18/30 = predictive of falls in PD MCID: 5 points stroke population, 4 points geriatric population (ANPTA Core Set of Outcome Measures for Adults with Neurologic Conditions, 2018). Patient completed the Timed Up and Go test (TUG) in 13.5 seconds.  Geriatrics: need for further assessment of fall risk: >= 12 sec; Recurrent falls: > 15 sec; Vestibular Disorders fall risk: > 15 sec; Parkinson's Disease fall risk: > 16 sec ([email or web address], 2023).  Five times Sit to Stand Test (FTSS) Method: Use a straight back chair with a solid seat that is 17-18" high. Ask participant to sit on the chair with arms folded across their chest.   Instructions: "Stand up and sit down as quickly as possible 5 times, keeping your arms folded across your chest."   Measurement: Stop timing when the participant touches the chair in sitting the 5th time.  TIME: 12.65 sec  Cut off scores indicative of increased fall risk: >12 sec CVA, >16 sec PD, >13 sec vestibular (ANPTA Core Set of Outcome Measures for Adults with Neurologic Conditions, 2018). 10 Meter Walk Test: Patient instructed to walk 10 meters (32.8 ft) as quickly and as safely as possible at their normal speed x2 and at a fast speed x2. Time measured from 2 meter mark to 8 meter mark to accommodate ramp-up and ramp-down.  Normal speed. .32m/s Cut off scores: <0.4 m/s = household Ambulator, 0.4-0.8 m/s = limited community Ambulator, >0.8 m/s = community Ambulator, >1.2 m/s = crossing a street, <1.0 = increased fall risk MCID 0.05 m/s (small), 0.13 m/s (moderate), 0.06 m/s (significant)  (ANPTA Core Set of Outcome Measures for Adults with Neurologic Conditions, 2018). Patient to dc from PT at this time.   OBJECTIVE IMPAIRMENTS: Abnormal gait, decreased activity tolerance, decreased balance, decreased endurance, decreased mobility, difficulty walking, decreased strength, impaired flexibility, impaired sensation, postural dysfunction, and pain.   ACTIVITY LIMITATIONS: standing, squatting, stairs, transfers, locomotion level, and caring for others  PARTICIPATION LIMITATIONS: driving, shopping, community activity, occupation, and yard work  PERSONAL FACTORS: Behavior pattern, Past/current experiences, Time since onset of injury/illness/exacerbation, and 3+ comorbidities: HTN, focal chorioretinal inflammation-managed with IVIG monthly, borderline glaucoma and retinal edema, multiple vitamin deficiencies,  chronic bilateral foot pain, gout, abnormal immunoglobulins, morbid obesity-BMI 48 was working on weight loss with dietitian in preparation  for bariatric surgery, polyneuropathy  are also affecting patient's functional outcome.   REHAB POTENTIAL: Good  CLINICAL DECISION MAKING: Evolving/moderate complexity  EVALUATION COMPLEXITY: Moderate  PLAN:  PT FREQUENCY: 2x/week  PT DURATION: 8 weeks   PLANNED INTERVENTIONS: 97164- PT Re-evaluation, 97110-Therapeutic exercises, 97530- Therapeutic activity, 97112- Neuromuscular re-education, 97535- Self Care, [id number]- Manual therapy, [phone]- Gait training, [phone]- Aquatic Therapy, Patient/Family education, Balance training, Stair training, Vestibular training, Cognitive remediation, and DME instructions  PLAN FOR NEXT SESSION: dc from PT   Delon DELENA Pop, PT Delon DELENA Pop, PT, DPT, CBIS  10/20/2023, 10:56 AM

## 2023-10-25 ENCOUNTER — Other Ambulatory Visit: Payer: Self-pay

## 2023-10-25 NOTE — Patient Instructions (Signed)
 Visit Information  Thank you for taking time to visit with me today. Please don't hesitate to contact me if I can be of assistance to you before our next scheduled appointment.  Your next care management appointment is no further scheduled appointments.   Please call the care guide team at [phone] if you need to cancel, schedule, or reschedule an appointment.   Please call the Suicide and Crisis Lifeline: 988 call 1-800-273-TALK (toll free, 24 hour hotline) if you are experiencing a Mental Health or Behavioral Health Crisis or need someone to talk to.  Theodora Fish, RN MSN [place]  [facility] RN Care Manager Direct Dial : [phone]  Fax: [phone]

## 2023-10-25 NOTE — Patient Outreach (Signed)
 Care Coordination   10/25/2023 Name: Tiffany Velasquez MRN: [id number] DOB: [birth year]   Care Coordination Outreach Attempts:  An unsuccessful outreach was attempted for an appointment today.  Follow Up Plan:  Additional outreach attempts will be made to complete follow-up visit.   Encounter Outcome:  No Answer. HIPAA compliant voicemail left asking for call back.   Rosaline Finlay, RN MSN [place]  [facility] RN Care Manager Direct Dial: [phone]  Fax: [phone]

## 2023-10-25 NOTE — Patient Outreach (Signed)
 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    Assessment: Patient Reported Symptoms:  Cognitive Cognitive Status: Able to follow simple commands, Alert and oriented to person, place, and time, Normal speech and language skills Cognitive/Intellectual Conditions Management [RPT]: None reported or documented in medical history or problem list      Neurological Neurological Review of Symptoms: Numbness (Numbness in toes, but she does feel that she is getting more sensation back in her feet) Neurological Management Strategies: Routine screening Neurological Comment: Patient reports she will be starting on a patch (Qutenza) to help with her nueropathy/pain.  HEENT HEENT Symptoms Reported: Not assessed      Cardiovascular Cardiovascular Symptoms Reported: Not assessed    Respiratory Respiratory Symptoms Reported: Not assesed    Endocrine Endocrine Symptoms Reported: Not assessed    Gastrointestinal Gastrointestinal Symptoms Reported: Not assessed      Genitourinary Genitourinary Symptoms Reported: Not assessed    Integumentary Integumentary Symptoms Reported: Not assessed    Musculoskeletal Musculoskelatal Symptoms Reviewed: Weakness Other Musculoskeletal Symptoms: Patient reports continued weakness in her legs with prolonged standing or walking. She is able to do more on her own and  reports strength has improved. Reports she is able to go longer and longer without having pain. She continues to use cane, rollator, or walker depending on how she is feeling that day and how far she is walking. Additional Musculoskeletal Details: Patient has completed PT since previous CMRN visit. Musculoskeletal Management Strategies: Medical device, Adequate rest, Exercise Musculoskeletal Comment: Patient reports she still has not heard from rheumatology in [facility]. She states she will discuss with provider at next visit. Falls in the past year?: No Number of falls in past year: 1 or less Was there an injury with Fall?: No Fall Risk Category Calculator: 0 Patient Fall Risk Level: Low Fall Risk Patient at Risk for Falls Due to: History of fall(s) Fall risk Follow up: Falls evaluation completed, Education provided  Psychosocial Psychosocial Symptoms Reported: Not assessed            10/16/2023   11:53 AM  Depression screen PHQ 2/9  Decreased Interest 0  Down, Depressed, Hopeless 0  PHQ - 2 Score 0  Altered sleeping 0  Tired, decreased energy 0  Change in appetite 0  Feeling bad or failure about yourself  0  Trouble concentrating 0  Moving slowly or fidgety/restless 0  Suicidal thoughts 0  PHQ-9 Score 0  Difficult doing work/chores Somewhat difficult    There were no vitals filed for this visit.  Medications Reviewed Today     Reviewed by Arno Rosaline SQUIBB, RN (Registered Nurse) on 10/25/23 at 1545  Med List Status: <None>   Medication Order Taking? Sig Documenting Provider Last Dose Status Informant  acetaminophen  (TYLENOL ) 325 MG tablet [id number]  Take 1-2 tablets (325-650 mg total) by mouth every 4 (  four) hours as needed for mild pain (pain score 1-3). Maurice Sharlet RAMAN, PA-C  Active   acetaZOLAMIDE  (DIAMOX ) 250 MG tablet [id number]  Take 500 mg by mouth 2 (two) times daily. [provider]  Active Self, Pharmacy Records           Med Note EFRAIM, TYELISHA  L   Fri Jul 14, 2023  9:00 PM) LF: 07/12/23 for a 30ds  Adalimumab 40 MG/0.4ML PNKT [id number]  Inject 40 mg into the skin every 14 (fourteen) days. [provider]  Active Self, Pharmacy Records  albuterol  (PROVENTIL ) (2.5 MG/3ML) 0.083% nebulizer solution [id number]  Take 3 mLs (2.5 mg total) by nebulization every 6 (six) hours as needed for wheezing or shortness of breath.  Patient taking differently: Take 2.5 mg by nebulization every 4 (four) hours as needed for wheezing or shortness of breath.   Iva Marty Saltness, MD  Active Self, Pharmacy Records  albuterol  (VENTOLIN  HFA) 108 [phone] Base) MCG/ACT inhaler [id number]  Inhale 2 puffs every 4-6 hours as needed for cough, wheeze, tightness in chest, or shortness of breath Ambs, Arlean HERO, FNP  Active Self, Pharmacy Records           Med Note [facility], TONIA S   Sat Jul 08, 2023  1:05 AM)    allopurinol  (ZYLOPRIM ) 100 MG tablet [id number]  Take 1 tablet (100 mg total) by mouth daily. Rumball, Alison M, DO  Active   azelastine  (ASTELIN ) 0.1 % nasal spray [id number]  USE 2 SPRAYS IN EACH NOSTRIL TWICE DAILY AS NEEDED FOR RUNNY NOSE/DRAINAGE DOWN THROAT  Patient taking differently: 2 sprays 2 (two) times daily. USE 2 SPRAYS IN EACH NOSTRIL TWICE DAILY AS NEEDED FOR RUNNY NOSE/DRAINAGE DOWN THROAT   Iva Marty Saltness, MD  Active Self, Pharmacy Records  bimatoprost (LUMIGAN) 0.01 % SOLN [id number]  Place 1 drop into the right eye at bedtime. [provider]  Active Self, Pharmacy Records           Med Note EFRAIM, TYELISHA L   Fri Jul 14, 2023  9:27 PM) LF: 05/25/23 for a 50ds  brimonidine  (ALPHAGAN ) 0.2 % ophthalmic solution [id number]  Place 1 drop into both eyes 3 (three) times daily. [provider]  Active Self, Pharmacy Records           Med Note EFRAIM, ALFREIDA CROME   Fri Jul 14, 2023  9:25 PM) LF: 06/28/23 for a 50ds  budesonide -formoterol  (SYMBICORT ) 160-4.5 MCG/ACT inhaler [id number]  Inhale 2 puffs into the lungs 2  (two) times daily. Maurice Sharlet RAMAN, PA-C  Active   busPIRone  (BUSPAR ) 5 MG tablet [id number]  Take 1 tablet (5 mg total) by mouth 3 (three) times daily. Rumball, Alison M, DO  Active   cyanocobalamin  (VITAMIN B12) 1000 MCG/ML injection [id number]  Inject 1 mL (1,000 mcg total) into the muscle every 30 (thirty) days. Rumball, Alison M, DO  Active   cyanocobalamin  1000 MCG tablet [id number]  Take 1 tablet (1,000 mcg total) by mouth daily. Rumball, Alison M, DO  Active   diclofenac  Sodium (VOLTAREN ) 1 % GEL [id number]  Apply 4 g topically 4 (four) times daily as needed. Rumball, Alison M, DO  Active   dorzolamide -timolol  (COSOPT ) 2-0.5 % ophthalmic solution [id number]  Place 1 drop into both eyes 2 (two) times daily. [provider]  Active Self, Pharmacy Records           Med Note [facility], TYELISHA L   Fri Jul 14, 2023  9:25 PM) LF:  07/02/23 for a 50ds  DULoxetine  (CYMBALTA ) 30 MG capsule [id number]  Take 1 capsule (30 mg total) by mouth 2 (two) times daily. Rumball, Alison M, DO  Active   Ensure Max Protein (ENSURE MAX PROTEIN) LIQD [id number]  Take 330 mLs (11 oz total) by mouth 2 (two) times daily. Cleotilde Perkins, DO  Active   famotidine  (PEPCID ) 40 MG tablet [id number]  Take 1 tablet (40 mg total) by mouth at bedtime. Craig Alan SAUNDERS, PA-C  Active Self, Pharmacy Records  fluticasone  (FLONASE ) 50 MCG/ACT nasal spray [id number]  Place 1 spray into both nostrils daily. Cari Arlean HERO, FNP  Active Self, Pharmacy Records           Med Note (LEE, NICOLE   Fri Jul 14, 2023 10:00 PM) Only uses when the Astelin  doesn't work   folic acid  (FOLVITE ) 1 MG tablet [id number]  Take 1 tablet (1 mg total) by mouth daily. Rumball, Alison M, DO  Active   gabapentin  (NEURONTIN ) 300 MG capsule [id number]  Take 2 capsules (600 mg total) by mouth 2 (two) times daily AND 3 capsules (900 mg total) at bedtime. Rumball, Alison M, DO  Active   GAMUNEX-C 20 GM/200ML SOLN [id number]   [provider]  Active Self,  Pharmacy Records           Med Note [facility], ADRIENNE B   Mon Jul 17, 2023  3:03 PM) Rph confirmed w/ infusion pharmacy - next session due 07/15/23  Heparin  Na, Pork, Lock Flsh PF (BD HEPARIN  POSIFLUSH) 100 UNIT/ML SOLN [id number]  Inject 100 Units into the vein daily as needed (Given by nurse at home). [provider]  Active            Med Note LEOBARDO, NICOLE   Fri Jul 14, 2023 11:14 PM) Unable to find guidance for dosage.  Patient states injection/flush is given by her nurse at home.     lidocaine  (LMX) 4 % cream [id number]  Apply topically 3 (three) times daily. Maurice Sharlet RAMAN, PA-C  Active   magnesium  oxide (MAG-OX) 400 (240 Mg) MG tablet [id number]  Take 1 tablet (400 mg total) by mouth 2 (two) times daily. Maurice Sharlet RAMAN, PA-C  Active   melatonin 3 MG TABS tablet [id number]  Take 1 tablet (3 mg total) by mouth at bedtime as needed. Maurice Sharlet RAMAN, PA-C  Active   methocarbamol  (ROBAXIN ) 500 MG tablet [id number]  Take 1 tablet (500 mg total) by mouth every 8 (eight) hours as needed for muscle spasms. Maurice Sharlet RAMAN, PA-C  Active   metoprolol  tartrate (LOPRESSOR ) 25 MG tablet [id number]  Take 0.5 tablets (12.5 mg total) by mouth 2 (two) times daily. Rumball, Alison M, DO  Active   montelukast  (SINGULAIR ) 10 MG tablet [id number]  Take 1 tablet (10 mg total) by mouth at bedtime. Iva Marty Saltness, MD  Active Self, Pharmacy Records  Multiple Vitamin (MULTIVITAMIN WITH MINERALS) TABS tablet [id number]  Take 1 tablet by mouth daily. Cleotilde Perkins, DO  Active   pantoprazole  (PROTONIX ) 40 MG tablet [id number]  Take 1 tablet (40 mg total) by mouth daily at 6 (six) AM. Madelon Donald HERO, DO  Active   polycarbophil (FIBERCON) 625 MG tablet [id number]  Take 1 tablet (625 mg total) by mouth daily. Love, Pamela S, PA-C  Active   polyethylene glycol (MIRALAX ) 17 g packet [id number]  Take 17 g by mouth daily. Rumball, Alison M, DO  Active   potassium chloride  (KLOR-CON  M) 10 MEQ tablet [id number]  Take 3  tablets (30 mEq total) by mouth 2 (two) times daily. Maurice Sharlet RAMAN, PA-C  Active   potassium chloride  (KLOR-CON ) 10 MEQ tablet [id number]  TAKE 3 TABLETS BY MOUTH TWICE DAILY Rumball, Alison M, DO  Active   RETIN-A  MICRO 0.04 % gel [id number]  Apply 1 application  topically daily as needed. [provider]  Active Self, Pharmacy Records           Med Note LEOBARDO, NICOLE   Fri Jul 14, 2023 10:51 PM) Rcvd OTC  RHOPRESSA  0.02 % SOLN [id number]  Place 1 drop into the right eye at bedtime. [provider]  Active Self, Pharmacy Records           Med Note EFRAIM, ALFREIDA CROME   Fri Jul 14, 2023  9:27 PM) LF: 06/09/23 for a 50ds  sucralfate  (CARAFATE ) 1 GM/10ML suspension [id number]  Take 10 mLs (1 g total) by mouth 2 (two) times daily. Maurice Sharlet RAMAN, PA-C  Active   thiamine  (VITAMIN B1) 100 MG tablet [id number]  Take 1 tablet (100 mg total) by mouth daily. Rumball, Alison M, DO  Active   traMADol  (ULTRAM ) 50 MG tablet [id number]  Take 1 tablet (50 mg total) by mouth every 12 (twelve) hours as needed for severe pain (pain score 7-10). Urbano Albright, MD  Active   vitamin A  3 MG (10000 UNITS) capsule [id number]  Take 1 capsule (10,000 Units total) by mouth daily. Love, Pamela S, PA-C  Active   Vitamin D , Ergocalciferol , (DRISDOL ) 1.25 MG (50000 UNIT) CAPS capsule [id number]  TAKE 1 CAPSULE BY MOUTH EVERY 7 DAYS Rumball, Donald HERO, DO  Active             Recommendation:   Continue Current Plan of Care  Follow Up Plan:   Closing From:  Complex Care Management Patient has met all care management goals. Care Management case will be closed. Patient has been provided contact information should new needs arise.   Rosaline Finlay, RN MSN   [facility] Population Health RN Care Manager Direct Dial: [phone]  Fax: [phone]

## 2023-10-27 ENCOUNTER — Ambulatory Visit

## 2023-10-27 DIAGNOSIS — J309 Allergic rhinitis, unspecified: Secondary | ICD-10-CM

## 2023-10-31 NOTE — Telephone Encounter (Signed)
 Please see message.

## 2023-11-02 DIAGNOSIS — H30033 Focal chorioretinal inflammation, peripheral, bilateral: Secondary | ICD-10-CM | POA: Diagnosis not present

## 2023-11-03 ENCOUNTER — Other Ambulatory Visit: Payer: Self-pay | Admitting: Family Medicine

## 2023-11-06 ENCOUNTER — Telehealth: Payer: Self-pay | Admitting: Family Medicine

## 2023-11-06 NOTE — Telephone Encounter (Signed)
 Patients mother dropped off form at front desk for [employer].  Verified that patient section of form has been completed.  Last DOS/WCC with PCP was 09/18/2023.  Placed form in [facility] team folder to be completed by clinical staff.  Millington A Warrick

## 2023-11-06 NOTE — Telephone Encounter (Signed)
 Reviewed form and placed in PCP's box for completion.  Cena JONELLE Pesa, CMA

## 2023-11-07 DIAGNOSIS — Z23 Encounter for immunization: Secondary | ICD-10-CM | POA: Diagnosis not present

## 2023-11-07 NOTE — Telephone Encounter (Signed)
 Called patient and informed of message per Dr. Madelon.   Form and letter placed at front desk for pick up.   Chiquita JAYSON English, RN

## 2023-11-07 NOTE — Telephone Encounter (Signed)
 Form reviewed, has to be completed by patient. I attached a letter of support to include with her request for deferment/excuse to be sent to the Court. Sent patient MyChart message to notify. Form and letter placed in RN box.

## 2023-11-08 ENCOUNTER — Ambulatory Visit

## 2023-11-08 DIAGNOSIS — Z111 Encounter for screening for respiratory tuberculosis: Secondary | ICD-10-CM | POA: Diagnosis not present

## 2023-11-08 NOTE — Progress Notes (Signed)
 Patient is here for a PPD placement.  PPD placed in left forearm @ 1045 am.  Patient will return 11/10/2023 to have PPD read. Chiquita JAYSON English, RN

## 2023-11-10 ENCOUNTER — Ambulatory Visit (INDEPENDENT_AMBULATORY_CARE_PROVIDER_SITE_OTHER)

## 2023-11-10 DIAGNOSIS — Z111 Encounter for screening for respiratory tuberculosis: Secondary | ICD-10-CM

## 2023-11-10 LAB — TB SKIN TEST
Induration: 0 mm
TB Skin Test: NEGATIVE

## 2023-11-10 NOTE — Progress Notes (Signed)
 Patient is here for a PPD read.  It was placed on 11/08/2023 in the left forearm @ 10:45 am.    PPD RESULTS:  Result: negative Induration: 0 mm  Letter created and given to patient for documentation purposes. Chiquita JAYSON English, RN

## 2023-11-23 ENCOUNTER — Other Ambulatory Visit: Payer: Self-pay | Admitting: Allergy & Immunology

## 2023-11-23 ENCOUNTER — Ambulatory Visit: Payer: Medicare HMO

## 2023-11-23 VITALS — Ht 69.0 in | Wt 340.0 lb

## 2023-11-23 DIAGNOSIS — Z0000 Encounter for general adult medical examination without abnormal findings: Secondary | ICD-10-CM | POA: Diagnosis not present

## 2023-11-23 NOTE — Patient Instructions (Signed)
 Ms. Cerino , Thank you for taking time out of your busy schedule to complete your Annual Wellness Visit with me. I enjoyed our conversation and look forward to speaking with you again next year. I, as well as your care team,  appreciate your ongoing commitment to your health goals. Please review the following plan we discussed and let me know if I can assist you in the future. Your Game plan/ To Do List    Referrals: If you haven't heard from the office you've been referred to, please reach out to them at the phone provided.   Follow up Visits: We will see or speak with you next year for your Next Medicare AWV with our clinical staff Have you seen your provider in the last 6 months (3 months if uncontrolled diabetes)? Yes, last seen in June 2025  Clinician Recommendations:  Aim for 30 minutes of exercise or brisk walking, 6-8 glasses of water, and 5 servings of fruits and vegetables each day.       This is a list of the screenings recommended for you:  Health Maintenance  Topic Date Due   Pneumococcal Vaccine (1 of 2 - PCV) Never done   Hepatitis B Vaccine (1 of 3 - 19+ 3-dose series) Never done   HPV Vaccine (3 - 3-dose series) 10/05/2020   COVID-19 Vaccine (5 - Pfizer risk 2024-25 season) 09/06/2023   Flu Shot  10/27/2023   Medicare Annual Wellness Visit  11/22/2024   Pap Smear  04/11/2026   DTaP/Tdap/Td vaccine (2 - Td or Tdap) 09/04/2027   Hepatitis C Screening  Completed   HIV Screening  Completed   Meningitis B Vaccine  Aged Out    Advanced directives: (Copy Requested) Please bring a copy of your health care power of attorney and living will to the office to be added to your chart at your convenience. You can mail to [facility] [address] [place], [place] [address] or email to [email or web address] Advance Care Planning is important because it:  [x]  Makes sure you receive the medical care that is consistent with your values, goals, and  preferences  [x]  It provides guidance to your family and loved ones and reduces their decisional burden about whether or not they are making the right decisions based on your wishes.  Follow the link provided in your after visit summary or read over the paperwork we have mailed to you to help you started getting your Advance Directives in place. If you need assistance in completing these, please reach out to us  so that we can help you!  See attachments for Preventive Care and Fall Prevention Tips.

## 2023-11-23 NOTE — Progress Notes (Signed)
 Because this visit was a virtual/telehealth visit,  certain criteria was not obtained, such a blood pressure, CBG if applicable, and timed get up and go. Any medications not marked as taking were not mentioned during the medication reconciliation part of the visit. Any vitals not documented were not able to be obtained due to this being a telehealth visit or patient was unable to self-report a recent blood pressure reading due to a lack of equipment at home via telehealth. Vitals that have been documented are verbally provided by the patient.   Subjective:   Tiffany Velasquez is a 28 y.o. who presents for a Medicare Wellness preventive visit.  As a reminder, Annual Wellness Visits don't include a physical exam, and some assessments may be limited, especially if this visit is performed virtually. We may recommend an in-person follow-up visit with your provider if needed.  Visit Complete: Virtual I connected with  Tiffany Velasquez on 11/23/23 by a audio enabled telemedicine application and verified that I am speaking with the correct person using two identifiers.  Patient Location: Home  Provider Location: Home Office  I discussed the limitations of evaluation and management by telemedicine. The patient expressed understanding and agreed to proceed.  Vital Signs: Because this visit was a virtual/telehealth visit, some criteria may be missing or patient reported. Any vitals not documented were not able to be obtained and vitals that have been documented are patient reported.  VideoDeclined- This patient declined Librarian, academic. Therefore the visit was completed with audio only.  Persons Participating in Visit: Patient.  AWV Questionnaire: No: Patient Medicare AWV questionnaire was not completed prior to this visit.  Cardiac Risk Factors include: hypertension;obesity (BMI >30kg/m2)     Objective:    Today's Vitals   11/23/23 1154  Weight: (!) 340 lb (154.2  kg)  Height: 5' 9 (1.753 m)  PainSc: 6   PainLoc: Foot   Body mass index is 50.21 kg/m.     11/23/2023   11:59 AM 09/26/2023   11:40 AM 09/01/2023   10:21 AM 08/31/2023    2:54 PM 08/30/2023   10:42 AM 08/29/2023   11:07 AM 07/20/2023   10:00 PM  Advanced Directives  Does Patient Have a Medical Advance Directive? Yes Yes Yes Yes Yes Yes Yes  Type of Advance Directive Living will;Healthcare Power of [employer] Power of [place];Living will Healthcare Power of [employer] Power of [employer] Power of [employer] Power of Attorney Healthcare Power of Attorney  Does patient want to make changes to medical advance directive? No - Patient declined    No - Patient declined  No - Patient declined  Copy of Healthcare Power of Attorney in Chart? No - copy requested   No - copy requested No - copy requested No - copy requested No - copy requested  Would patient like information on creating a medical advance directive? No - Patient declined  No - Patient declined No - Patient declined No - Patient declined No - Patient declined No - Patient declined    Current Medications (verified) Outpatient Encounter Medications as of 11/23/2023  Medication Sig   acetaminophen  (TYLENOL ) 325 MG tablet Take 1-2 tablets (325-650 mg total) by mouth every 4 (four) hours as needed for mild pain (pain score 1-3).   acetaZOLAMIDE  (DIAMOX ) 250 MG tablet Take 500 mg by mouth 2 (two) times daily.   Adalimumab 40 MG/0.4ML PNKT Inject 40 mg into the skin every 14 (fourteen) days.  albuterol  (PROVENTIL ) (2.5 MG/3ML) 0.083% nebulizer solution Take 3 mLs (2.5 mg total) by nebulization every 6 (six) hours as needed for wheezing or shortness of breath. (Patient taking differently: Take 2.5 mg by nebulization every 4 (four) hours as needed for wheezing or shortness of breath.)   albuterol  (VENTOLIN  HFA) 108 (90 Base) MCG/ACT inhaler Inhale 2 puffs every 4-6 hours as needed for cough, wheeze, tightness in  chest, or shortness of breath   allopurinol  (ZYLOPRIM ) 100 MG tablet Take 1 tablet (100 mg total) by mouth daily.   azelastine  (ASTELIN ) 0.1 % nasal spray USE 2 SPRAYS IN EACH NOSTRIL TWICE DAILY AS NEEDED FOR RUNNY NOSE/DRAINAGE DOWN THROAT (Patient taking differently: 2 sprays 2 (two) times daily. USE 2 SPRAYS IN EACH NOSTRIL TWICE DAILY AS NEEDED FOR RUNNY NOSE/DRAINAGE DOWN THROAT)   bimatoprost (LUMIGAN) 0.01 % SOLN Place 1 drop into the right eye at bedtime.   brimonidine  (ALPHAGAN ) 0.2 % ophthalmic solution Place 1 drop into both eyes 3 (three) times daily.   budesonide -formoterol  (SYMBICORT ) 160-4.5 MCG/ACT inhaler Inhale 2 puffs into the lungs 2 (two) times daily.   busPIRone  (BUSPAR ) 5 MG tablet Take 1 tablet (5 mg total) by mouth 3 (three) times daily.   cyanocobalamin  (VITAMIN B12) 1000 MCG/ML injection Inject 1 mL (1,000 mcg total) into the muscle every 30 (thirty) days.   cyanocobalamin  1000 MCG tablet Take 1 tablet (1,000 mcg total) by mouth daily.   diclofenac  Sodium (VOLTAREN ) 1 % GEL Apply 4 g topically 4 (four) times daily as needed.   dorzolamide -timolol  (COSOPT ) 2-0.5 % ophthalmic solution Place 1 drop into both eyes 2 (two) times daily.   DULoxetine  (CYMBALTA ) 30 MG capsule Take 1 capsule (30 mg total) by mouth 2 (two) times daily.   Ensure Max Protein (ENSURE MAX PROTEIN) LIQD Take 330 mLs (11 oz total) by mouth 2 (two) times daily.   famotidine  (PEPCID ) 40 MG tablet Take 1 tablet (40 mg total) by mouth at bedtime.   fluticasone  (FLONASE ) 50 MCG/ACT nasal spray Place 1 spray into both nostrils daily.   folic acid  (FOLVITE ) 1 MG tablet Take 1 tablet (1 mg total) by mouth daily.   gabapentin  (NEURONTIN ) 300 MG capsule Take 2 capsules (600 mg total) by mouth 2 (two) times daily AND 3 capsules (900 mg total) at bedtime.   GAMUNEX-C 20 GM/200ML SOLN    Heparin  Na, Pork, Lock Flsh PF (BD HEPARIN  POSIFLUSH) 100 UNIT/ML SOLN Inject 100 Units into the vein daily as needed (Given by  nurse at home).   lidocaine  (LMX) 4 % cream Apply topically 3 (three) times daily.   magnesium  oxide (MAG-OX) 400 (240 Mg) MG tablet Take 1 tablet (400 mg total) by mouth 2 (two) times daily.   melatonin 3 MG TABS tablet Take 1 tablet (3 mg total) by mouth at bedtime as needed.   methocarbamol  (ROBAXIN ) 500 MG tablet Take 1 tablet (500 mg total) by mouth every 8 (eight) hours as needed for muscle spasms.   metoprolol  tartrate (LOPRESSOR ) 25 MG tablet Take 0.5 tablets (12.5 mg total) by mouth 2 (two) times daily.   montelukast  (SINGULAIR ) 10 MG tablet Take 1 tablet (10 mg total) by mouth at bedtime.   Multiple Vitamin (MULTIVITAMIN WITH MINERALS) TABS tablet Take 1 tablet by mouth daily.   pantoprazole  (PROTONIX ) 40 MG tablet Take 1 tablet (40 mg total) by mouth daily at 6 (six) AM.   polycarbophil (FIBERCON) 625 MG tablet Take 1 tablet (625 mg total) by mouth daily.  polyethylene glycol (MIRALAX ) 17 g packet Take 17 g by mouth daily.   potassium chloride  (KLOR-CON  M) 10 MEQ tablet Take 3 tablets (30 mEq total) by mouth 2 (two) times daily.   potassium chloride  (KLOR-CON ) 10 MEQ tablet TAKE 3 TABLETS BY MOUTH TWICE DAILY   RETIN-A  MICRO 0.04 % gel Apply 1 application  topically daily as needed.   RHOPRESSA  0.02 % SOLN Place 1 drop into the right eye at bedtime.   sucralfate  (CARAFATE ) 1 GM/10ML suspension Take 10 mLs (1 g total) by mouth 2 (two) times daily.   thiamine  (VITAMIN B1) 100 MG tablet Take 1 tablet (100 mg total) by mouth daily.   traMADol  (ULTRAM ) 50 MG tablet Take 1 tablet (50 mg total) by mouth every 12 (twelve) hours as needed for severe pain (pain score 7-10).   vitamin A  3 MG (10000 UNITS) capsule Take 1 capsule (10,000 Units total) by mouth daily.   Vitamin D , Ergocalciferol , (DRISDOL ) 1.25 MG (50000 UNIT) CAPS capsule TAKE 1 CAPSULE BY MOUTH EVERY 7 DAYS   No facility-administered encounter medications on file as of 11/23/2023.    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   Past Surgical History:  Procedure Laterality Date   BIOPSY  04/04/2023   Procedure: BIOPSY;  Surgeon: Lyndel Deward PARAS, MD;  Location: [facility];  Service: General;;   CATARACT EXTRACTION     ESOPHAGOGASTRODUODENOSCOPY N/A 04/04/2023   Procedure: ESOPHAGOGASTRODUODENOSCOPY (EGD);  Surgeon: Lyndel Deward PARAS, MD;  Location: [facility];  Service: General;  Laterality: N/A;   PORTA CATH INSERTION     Family History  Problem Relation Age of Onset   Asthma Mother    Allergic rhinitis Father    Sudden death Cousin    Eczema Neg Hx    Urticaria Neg Hx    Social History   Socioeconomic History   Marital status: Single    Spouse name: Not on file   Number of children: Not on file   Years of education: Not on file   Highest education level: Associate degree: occupational, Scientist, product/process development, or vocational program  Occupational History   Not on file  Tobacco Use   Smoking status: Never    Passive exposure: Never   Smokeless tobacco: Never  Vaping Use   Vaping status: Never Used  Substance and Sexual Activity   Alcohol use: Yes    Comment: rarely   Drug use: No   Sexual activity: Not Currently  Other Topics Concern   Not on file  Social History Narrative   Patient lives with parents.    Social Drivers of Corporate investment banker Strain: Low Risk  (11/23/2023)   Overall Financial Resource Strain (CARDIA)    Difficulty of Paying Living Expenses: Not hard  at all  Food Insecurity: Patient Declined (11/23/2023)   Hunger Vital Sign    Worried About Running Out of Food in the Last Year: Patient declined    Ran Out of Food in the Last Year: Patient declined  Transportation Needs: Patient Declined (11/23/2023)   PRAPARE - Transportation    Lack of Transportation (Medical): Patient declined    Lack of Transportation (Non-Medical): Patient declined  Physical Activity: Sufficiently Active (11/23/2023)   Exercise Vital Sign    Days of Exercise per Week: 5 days    Minutes of Exercise per Session: 30 min  Recent Concern: Physical Activity - Insufficiently Active (09/18/2023)   Exercise Vital Sign    Days of Exercise per Week: 2 days    Minutes of Exercise per Session: 20 min  Stress: No Stress Concern Present (11/23/2023)   [employer] of Occupational Health - Occupational Stress Questionnaire    Feeling of Stress: Not at all  Social Connections: Moderately Integrated (11/23/2023)   Social Connection and Isolation Panel    Frequency of Communication with Friends and Family: Three times a week    Frequency of Social Gatherings with Friends and Family: Twice a week    Attends Religious Services: More than 4 times per year    Active Member of [employer] or Organizations: No    Attends Engineer, structural: More than 4 times per year    Marital Status: Never married  Recent Concern: Social Connections - Moderately Isolated (09/18/2023)   Social Connection and Isolation Panel    Frequency of Communication with Friends and Family: Three times a week    Frequency of Social Gatherings with Friends and Family: Twice a week    Attends Religious Services: More than 4 times per year    Active Member of [employer] or Organizations: No    Attends Engineer, structural: Not on file    Marital Status: Never married    Tobacco Counseling Counseling given: Not Answered    Clinical Intake:  Pre-visit preparation completed: Yes  Pain : 0-10 Pain  Score: 6  (NEUROPATHY) Pain Type: Neuropathic pain Pain Location: Foot Pain Orientation: Left, Right Pain Descriptors / Indicators: Aching, Tingling, Sore, Numbness, Pins and needles Pain Relieving Factors: GABAPENTIN  AND TRAMADOL   Pain Relieving Factors: GABAPENTIN  AND TRAMADOL   BMI - recorded: 50.21 Nutritional Status: BMI > 30  Obese Nutritional Risks: None Diabetes: No  Lab Results  Component Value Date   HGBA1C 4.6 (L) 07/08/2023   HGBA1C 5.7 (H) 12/19/2022   HGBA1C 5.5 04/11/2014     How often do you need to have someone help you when you read instructions, pamphlets, or other written materials from your doctor or pharmacy?: 1 - Never What is the last grade level you completed in school?: HSG, ASSOCIATE'S DEGREE  Interpreter Needed?: No  Information entered by :: Roz Fuller, LPN.   Activities of Daily Living     11/23/2023   12:02 PM 07/20/2023   10:00 PM  In your present state of health, do you have any difficulty performing the following activities:  Hearing? 0 0  Vision? 0 1  Difficulty concentrating or making decisions? 0 0  Walking or climbing stairs? 0   Dressing or bathing? 0   Doing errands, shopping? 0 0  Preparing Food and eating ? N   Using the Toilet? N   In the past six months, have you accidently leaked urine? N   Do you have problems with loss of bowel control? N   Managing your Medications? N   Managing your Finances? N   Housekeeping or managing your Housekeeping? N     Patient Care Team: Madelon Donald HERO, DO as PCP - General (Family Medicine) Iva Marty Saltness, MD as  Consulting Physician (Allergy  and Immunology) Court Pulling, MD as Referring Physician (Dermatology) Patel, Donika K, DO as Consulting Physician (Neurology) Maree Paticia BRAVO, MD as Referring Physician (Ophthalmology) Bond, Reyes Mcardle, MD as Referring Physician (Ophthalmology)  I have updated your Care Teams any recent Medical Services you may have received  from other providers in the past year.     Assessment:   This is a routine wellness examination for [facility].  Hearing/Vision screen Hearing Screening - Comments:: Denies hearing difficulties.  Vision Screening - Comments:: Wears rx glasses - up to date with routine eye exams with Paticia Maree, MD ([facility]) and Reyes Bracket, MD.    Goals Addressed             This Visit's Progress    11/23/2023: Continue to maintain, stay active and independent.         Depression Screen     11/23/2023   12:03 PM 10/16/2023   11:53 AM 09/18/2023   11:04 AM 08/30/2023   10:41 AM 08/24/2023   11:33 AM 07/12/2023    9:28 AM 06/05/2023   10:42 AM  PHQ 2/9 Scores  PHQ - 2 Score 0 0 0 0 2  0  PHQ- 9 Score 0 0 1  4  1   Exception Documentation      Patient refusal     Fall Risk     11/23/2023   12:01 PM 10/25/2023    3:37 PM 10/16/2023   11:53 AM 09/27/2023    1:01 PM 09/26/2023   11:40 AM  Fall Risk   Falls in the past year? 1 0 0 0 0  Number falls in past yr: 1 0  0 0  Injury with Fall? 1 0  0 0  Risk for fall due to : History of fall(s);Impaired balance/gait;Orthopedic patient;Other (Comment) History of fall(s)  History of fall(s)   Risk for fall due to: Comment NEUROPATHY      Follow up Falls evaluation completed;Falls prevention discussed Falls evaluation completed;Education provided  Falls evaluation completed;Education provided Falls evaluation completed    MEDICARE RISK AT HOME:  Medicare Risk at Home Any stairs in or around the home?: Yes (FRONT PORCH 4 STEPS WITH RAILINGS) If so, are there any without handrails?: No Home free of loose throw rugs in walkways, pet beds, electrical cords, etc?: Yes Adequate lighting in your home to reduce risk of falls?: Yes Life alert?: No Use of a cane, walker or w/c?: Yes Grab bars in the bathroom?: Yes Shower chair or bench in shower?: Yes Elevated toilet seat or a handicapped toilet?: Yes  TIMED UP AND GO:  Was the test  performed?  No  Cognitive Function: 6CIT completed    11/23/2023   12:03 PM  MMSE - Mini Mental State Exam  Not completed: Unable to complete        11/23/2023   12:08 PM 11/18/2022    7:34 PM  6CIT Screen  What Year? 0 points 0 points  What month? 0 points 0 points  What time? 0 points 0 points  Count back from 20 0 points 0 points  Months in reverse 0 points 0 points  Repeat phrase 0 points 0 points  Total Score 0 points 0 points    Immunizations Immunization History  Administered Date(s) Administered   H1N1 04/16/2008   HPV 9-valent 07/13/2020   HPV Quadrivalent 04/11/2014   Hepatitis A 04/16/2008   Influenza Inj Mdck Quad Pf 12/11/2018   Influenza Split 03/09/2011,  03/29/2012   Influenza Whole 01/17/2007, 12/27/2007   Influenza, Seasonal, Injecte, Preservative Fre 12/27/2022   Influenza,inj,Quad PF,6+ Mos 03/26/2013, 12/16/2013, 04/04/2016, 02/03/2017, 02/21/2018, 02/05/2020, 03/11/2021   Meningococcal Conjugate 04/11/2014   PFIZER(Purple Top)SARS-COV-2 Vaccination 02/14/2020   PPD Test 03/08/2021, 11/08/2023   Pfizer Covid-19 Vaccine Bivalent Booster 68yrs & up 02/23/2021   Pfizer(Comirnaty)Fall Seasonal Vaccine 12 years and older 02/10/2022, 03/08/2023   Tdap 09/03/2017   Varicella 04/16/2008    Screening Tests Health Maintenance  Topic Date Due   Pneumococcal Vaccine (1 of 2 - PCV) Never done   Hepatitis B Vaccines 19-59 Average Risk (1 of 3 - 19+ 3-dose series) Never done   HPV VACCINES (3 - 3-dose series) 10/05/2020   COVID-19 Vaccine (5 - Pfizer risk 2024-25 season) 09/06/2023   INFLUENZA VACCINE  10/27/2023   Medicare Annual Wellness (AWV)  11/22/2024   Cervical Cancer Screening (Pap smear)  04/11/2026   DTaP/Tdap/Td (2 - Td or Tdap) 09/04/2027   Hepatitis C Screening  Completed   HIV Screening  Completed   Meningococcal B Vaccine  Aged Out    Health Maintenance  Health Maintenance Due  Topic Date Due   Pneumococcal Vaccine (1 of 2 - PCV) Never  done   Hepatitis B Vaccines 19-59 Average Risk (1 of 3 - 19+ 3-dose series) Never done   HPV VACCINES (3 - 3-dose series) 10/05/2020   COVID-19 Vaccine (5 - Pfizer risk 2024-25 season) 09/06/2023   INFLUENZA VACCINE  10/27/2023   Health Maintenance Items Addressed: Yes Patient aware of current care gaps.  Patient is due for Covid-19, Flu, Hepatitis B, HPV and Pneumonia vaccines.   Additional Screening:  Vision Screening: Recommended annual ophthalmology exams for early detection of glaucoma and other disorders of the eye. Would you like a referral to an eye doctor? No    Dental Screening: Recommended annual dental exams for proper oral hygiene  Community Resource Referral / Chronic Care Management: CRR required this visit?  No   CCM required this visit?  No   Plan:    I have personally reviewed and noted the following in the patient's chart:   Medical and social history Use of alcohol, tobacco or illicit drugs  Current medications and supplements including opioid prescriptions. Patient is not currently taking opioid prescriptions. Functional ability and status Nutritional status Physical activity Advanced directives List of other physicians Hospitalizations, surgeries, and ER visits in previous 12 months Vitals Screenings to include cognitive, depression, and falls Referrals and appointments  In addition, I have reviewed and discussed with patient certain preventive protocols, quality metrics, and best practice recommendations. A written personalized care plan for preventive services as well as general preventive health recommendations were provided to patient.   Roz LOISE Fuller, LPN   [id number]   After Visit Summary: (MyChart) Due to this being a telephonic visit, the after visit summary with patients personalized plan was offered to patient via MyChart   Notes: Patient aware of current care gaps.  Patient is due for Covid-19, Flu, Hepatitis B, HPV and Pneumonia  vaccines.

## 2023-11-25 ENCOUNTER — Other Ambulatory Visit: Payer: Self-pay | Admitting: Family Medicine

## 2023-11-28 ENCOUNTER — Other Ambulatory Visit: Payer: Self-pay | Admitting: Family Medicine

## 2023-11-28 DIAGNOSIS — M109 Gout, unspecified: Secondary | ICD-10-CM

## 2023-11-30 ENCOUNTER — Other Ambulatory Visit: Payer: Self-pay | Admitting: Family Medicine

## 2023-11-30 DIAGNOSIS — M109 Gout, unspecified: Secondary | ICD-10-CM

## 2023-12-05 ENCOUNTER — Other Ambulatory Visit: Payer: Self-pay | Admitting: Family Medicine

## 2023-12-08 ENCOUNTER — Ambulatory Visit (INDEPENDENT_AMBULATORY_CARE_PROVIDER_SITE_OTHER)

## 2023-12-08 DIAGNOSIS — J309 Allergic rhinitis, unspecified: Secondary | ICD-10-CM | POA: Diagnosis not present

## 2023-12-11 ENCOUNTER — Encounter: Admitting: Physical Medicine & Rehabilitation

## 2023-12-13 DIAGNOSIS — H40043 Steroid responder, bilateral: Secondary | ICD-10-CM | POA: Diagnosis not present

## 2023-12-13 DIAGNOSIS — H30033 Focal chorioretinal inflammation, peripheral, bilateral: Secondary | ICD-10-CM | POA: Diagnosis not present

## 2023-12-13 DIAGNOSIS — H209 Unspecified iridocyclitis: Secondary | ICD-10-CM | POA: Diagnosis not present

## 2023-12-13 DIAGNOSIS — Z961 Presence of intraocular lens: Secondary | ICD-10-CM | POA: Diagnosis not present

## 2023-12-13 DIAGNOSIS — H4043X4 Glaucoma secondary to eye inflammation, bilateral, indeterminate stage: Secondary | ICD-10-CM | POA: Diagnosis not present

## 2023-12-13 DIAGNOSIS — H268 Other specified cataract: Secondary | ICD-10-CM | POA: Diagnosis not present

## 2023-12-13 DIAGNOSIS — Z9889 Other specified postprocedural states: Secondary | ICD-10-CM | POA: Diagnosis not present

## 2023-12-14 ENCOUNTER — Ambulatory Visit (INDEPENDENT_AMBULATORY_CARE_PROVIDER_SITE_OTHER)

## 2023-12-14 DIAGNOSIS — J309 Allergic rhinitis, unspecified: Secondary | ICD-10-CM

## 2023-12-17 ENCOUNTER — Other Ambulatory Visit: Payer: Self-pay | Admitting: Neurology

## 2023-12-19 ENCOUNTER — Other Ambulatory Visit: Payer: Self-pay | Admitting: Family Medicine

## 2023-12-24 ENCOUNTER — Other Ambulatory Visit: Payer: Self-pay | Admitting: Neurology

## 2023-12-24 ENCOUNTER — Other Ambulatory Visit: Payer: Self-pay | Admitting: Physician Assistant

## 2023-12-24 ENCOUNTER — Other Ambulatory Visit: Payer: Self-pay | Admitting: Family Medicine

## 2023-12-25 ENCOUNTER — Telehealth: Payer: Self-pay

## 2023-12-25 NOTE — Telephone Encounter (Signed)
 Qutenza patches denied. Let me know if you want to do an appeal.

## 2023-12-27 ENCOUNTER — Ambulatory Visit (INDEPENDENT_AMBULATORY_CARE_PROVIDER_SITE_OTHER)

## 2023-12-27 ENCOUNTER — Encounter: Payer: Self-pay | Admitting: Neurology

## 2023-12-27 ENCOUNTER — Ambulatory Visit (INDEPENDENT_AMBULATORY_CARE_PROVIDER_SITE_OTHER): Admitting: Neurology

## 2023-12-27 VITALS — BP 119/79 | HR 65 | Resp 20 | Ht 69.0 in | Wt 379.0 lb

## 2023-12-27 DIAGNOSIS — G629 Polyneuropathy, unspecified: Secondary | ICD-10-CM

## 2023-12-27 DIAGNOSIS — E5111 Dry beriberi: Secondary | ICD-10-CM

## 2023-12-27 DIAGNOSIS — E538 Deficiency of other specified B group vitamins: Secondary | ICD-10-CM | POA: Diagnosis not present

## 2023-12-27 DIAGNOSIS — J309 Allergic rhinitis, unspecified: Secondary | ICD-10-CM | POA: Diagnosis not present

## 2023-12-27 DIAGNOSIS — G63 Polyneuropathy in diseases classified elsewhere: Secondary | ICD-10-CM

## 2023-12-27 DIAGNOSIS — E889 Metabolic disorder, unspecified: Secondary | ICD-10-CM

## 2023-12-27 MED ORDER — DULOXETINE HCL 30 MG PO CPEP: 30.0000 mg | ORAL_CAPSULE | Freq: Two times a day (BID) | ORAL | 1 refills | Status: DC

## 2023-12-27 MED ORDER — GABAPENTIN 300 MG PO CAPS: ORAL_CAPSULE | ORAL | 1 refills | Status: AC

## 2023-12-27 NOTE — Progress Notes (Signed)
 Follow-up Visit   Date: 12/27/2023    Tiffany Velasquez MRN: [id number] DOB: Jan 19, 1996    Tiffany Velasquez is a 28 y.o. 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  The patient was accompanied to the clinic by mom who also provides collateral information.    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  Motor strength is full and intact.  She continues to have distal sensory loss in the lower legs and feet, which is expected.  I explained that sensation in the feet takes the longest to show improvement. For painful paresthesias, continue gabapentin  600mg  in the morning, 600mg  in the afternoon, and 900mg  at bedtime Continue duloxetine  30mg  twice daily  Continue home PT exercises Continue vitamin B12, vitamin B1, and folate supplements  Return to clinic in 6 months  --------------------------------------------- History of present illness: Starting in February 2025, she began having shooting pain involving all the toes.  She was found to have elevated uric acid and treated as gout.  Symptoms improved.  Two weeks later, she began having similar burning and shooting pain involving the feet.  She began having shooting pain in the legs. She saw [facility] who started gabapentin  300mg  at bedtime.  The dose was increased to gabapentin  600mg  three times daily by orthopeadics.  She had minimal relief at this dose.    She was admitted at [facility] 4/11-4/13 with dehydration, generalized weakness, and AKI thought to be due to viral gastroenteritis.  She had MRI lumbar spine which shows mild disc bulge at L5-S1.  Her vitamin B12 was low at 194.  She has not started supplements yet.      She lost 70lb over 6 months and is planning to have bariatric surgery in the summer.  She is not on a daily multivitamin.  Nonsmoker.  She does not drink alcohol.     UPDATE 09/26/2023:  She is here for follow-up visit.  She was admitted to [facility] from 4/18 - 4/24 with severe vitamin deficiency resulting in bilateral leg weakness and pain and found to have myositis, pneumonia, anemia, and neuropathy.  She was discharged to rehab and then home.  She completed OT and continues to do PT.  She is able to walk unassisted and can stand up without using arms.  She continues to have numbness in the lower legs and feet, but no longer in the thighs.  She also has not had any stabbing or burning pain in the feet in several weeks.  She takes gabapentin  600mg  in the morning, 600mg  in the afternoon, and 900mg  at bedtime along with duloxetine  30mg  twice daily.    UPDATE 12/27/2023:  She is here for follow-up visit.  She reports strength is much better and she completed PT in July.  She continues to have numbness/tingling in the legs and was unable to lower the dose of gabapentin .  She went on a cruise and felt that it was harder for her to keep sandals on.  No interval falls.  Her balance can be impaired at times, but she is more active now.  She is compliant with medications and supplements.    Medications:  Current Outpatient Medications on File Prior to Visit  Medication Sig Dispense Refill   acetaminophen  (TYLENOL ) 325  MG tablet Take 1-2 tablets (325-650 mg total) by mouth every 4 (four) hours as needed for mild pain (pain score 1-3).     acetaZOLAMIDE  (DIAMOX ) 250 MG tablet Take 500 mg by mouth 2 (two) times daily.     Adalimumab 40 MG/0.4ML PNKT Inject 40 mg into the skin every 14 (fourteen) days.     albuterol  (PROVENTIL ) (2.5 MG/3ML) 0.083% nebulizer solution Take 3 mLs (2.5 mg total) by nebulization every 6 (six) hours as needed for wheezing or shortness of breath. (Patient taking differently: Take 2.5 mg by  nebulization every 4 (four) hours as needed for wheezing or shortness of breath.) 75 mL 1   albuterol  (VENTOLIN  HFA) 108 (90 Base) MCG/ACT inhaler Inhale 2 puffs every 4-6 hours as needed for cough, wheeze, tightness in chest, or shortness of breath 8 g 1   allopurinol  (ZYLOPRIM ) 100 MG tablet TAKE 1 TABLET(100 MG) BY MOUTH DAILY 90 tablet 2   azelastine  (ASTELIN ) 0.1 % nasal spray USE 2 SPRAYS IN EACH NOSTRIL TWICE DAILY AS NEEDED FOR RUNNY NOSE/DRAINAGE DOWN THROAT (Patient taking differently: 2 sprays 2 (two) times daily. USE 2 SPRAYS IN EACH NOSTRIL TWICE DAILY AS NEEDED FOR RUNNY NOSE/DRAINAGE DOWN THROAT) 30 mL 5   bimatoprost (LUMIGAN) 0.01 % SOLN Place 1 drop into the right eye at bedtime.     brimonidine  (ALPHAGAN ) 0.2 % ophthalmic solution Place 1 drop into both eyes 3 (three) times daily.     budesonide -formoterol  (SYMBICORT ) 160-4.5 MCG/ACT inhaler Inhale 2 puffs into the lungs 2 (two) times daily. 10.2 g 0   busPIRone  (BUSPAR ) 5 MG tablet Take 1 tablet (5 mg total) by mouth 3 (three) times daily. 270 tablet 1   cyanocobalamin  (VITAMIN B12) 1000 MCG/ML injection Inject 1 mL (1,000 mcg total) into the muscle every 30 (thirty) days. 10 mL 0   cyanocobalamin  1000 MCG tablet Take 1 tablet (1,000 mcg total) by mouth daily. 90 tablet 0   diclofenac  Sodium (VOLTAREN ) 1 % GEL Apply 4 g topically 4 (four) times daily as needed. 50 g 1   dorzolamide -timolol  (COSOPT ) 2-0.5 % ophthalmic solution Place 1 drop into both eyes 2 (two) times daily.     Ensure Max Protein (ENSURE MAX PROTEIN) LIQD Take 330 mLs (11 oz total) by mouth 2 (two) times daily.     famotidine  (PEPCID ) 40 MG tablet Take 1 tablet (40 mg total) by mouth at bedtime. 90 tablet 3   fluticasone  (FLONASE ) 50 MCG/ACT nasal spray Place 1 spray into both nostrils daily. 16 each 3   folic acid  (FOLVITE ) 1 MG tablet TAKE 1 TABLET(1 MG) BY MOUTH DAILY 90 tablet 0   GAMUNEX-C 20 GM/200ML SOLN      Heparin  Na, Pork, Lock Flsh PF (BD HEPARIN   POSIFLUSH) 100 UNIT/ML SOLN Inject 100 Units into the vein daily as needed (Given by nurse at home).     lidocaine  (LMX) 4 % cream Apply topically 3 (three) times daily. 113 g 0   magnesium  oxide (MAG-OX) 400 (240 Mg) MG tablet Take 1 tablet (400 mg total) by mouth 2 (two) times daily. 60 tablet 0   melatonin 3 MG TABS tablet Take 1 tablet (3 mg total) by mouth at bedtime as needed. 30 tablet 0   methocarbamol  (ROBAXIN ) 500 MG tablet Take 1 tablet (500 mg total) by mouth every 8 (eight) hours as needed for muscle spasms. 30 tablet 0   metoprolol  tartrate (LOPRESSOR ) 25 MG tablet TAKE 0.5 TABLET BY MOUTH TWICE DAILY 90 tablet  0   montelukast  (SINGULAIR ) 10 MG tablet Take 1 tablet (10 mg total) by mouth at bedtime. 90 tablet 1   Multiple Vitamin (MULTIVITAMIN WITH MINERALS) TABS tablet Take 1 tablet by mouth daily.     ondansetron  (ZOFRAN ) 8 MG tablet TAKE 1 TABLET(8 MG) BY MOUTH EVERY 8 HOURS AS NEEDED FOR NAUSEA OR VOMITING 90 tablet 1   pantoprazole  (PROTONIX ) 40 MG tablet Take 1 tablet (40 mg total) by mouth daily at 6 (six) AM. 30 tablet 0   polycarbophil (FIBERCON) 625 MG tablet Take 1 tablet (625 mg total) by mouth daily. 30 tablet 0   polyethylene glycol (MIRALAX ) 17 g packet Take 17 g by mouth daily. 30 each 6   potassium chloride  (KLOR-CON  M) 10 MEQ tablet Take 3 tablets (30 mEq total) by mouth 2 (two) times daily. 180 tablet 0   potassium chloride  (KLOR-CON ) 10 MEQ tablet TAKE 3 TABLETS BY MOUTH TWICE DAILY 360 tablet 3   RETIN-A  MICRO 0.04 % gel Apply 1 application  topically daily as needed.     RHOPRESSA  0.02 % SOLN Place 1 drop into the right eye at bedtime.     sucralfate  (CARAFATE ) 1 GM/10ML suspension Take 10 mLs (1 g total) by mouth 2 (two) times daily. 60 mL 0   thiamine  (VITAMIN B1) 100 MG tablet Take 1 tablet (100 mg total) by mouth daily. 90 tablet 0   traMADol  (ULTRAM ) 50 MG tablet Take 1 tablet (50 mg total) by mouth every 12 (twelve) hours as needed for severe pain (pain  score 7-10). 30 tablet 0   vitamin A  3 MG (10000 UNITS) capsule Take 1 capsule (10,000 Units total) by mouth daily. 30 capsule 0   Vitamin D , Ergocalciferol , (DRISDOL ) 1.25 MG (50000 UNIT) CAPS capsule TAKE 1 CAPSULE BY MOUTH EVERY 7 DAYS 5 capsule 1   No current facility-administered medications on file prior to visit.    Allergies:  Allergies  Allergen Reactions   Other Itching, Rash and Swelling    Seafood,tomato paste, peanut  butter, peaches, oranges, apples Throat swelling  Dust mite, oak trees, grass- causes rash, itching   Peanut -Containing Drug Products Anaphylaxis   Shellfish Allergy  Anaphylaxis    Throat swelling    Apple Juice Rash   Orange Fruit [Citrus] Rash   Peach Flavoring Agent (Non-Screening) Rash   Tomato Rash, Hives and Itching   Tree Extract Rash    Vital Signs:  BP 119/79   Pulse 65   Resp 20   Ht 5' 9 (1.753 m)   Wt (!) 379 lb (171.9 kg)   SpO2 99%   BMI 55.97 kg/m    Neurological Exam: MENTAL STATUS including orientation to time, place, person, recent and remote memory, attention span and concentration, language, and fund of knowledge is normal.  Speech is not dysarthric.  CRANIAL NERVES:  Pupil asymmetry with right pupil 3mm and left pupil 1mm, slow to react bilaterally to light (right eye is surgical). Normal conjugate, extra-ocular eye movements in all directions of gaze.  No ptosis.  Face is symmetric.   MOTOR:  Motor strength is 5/5 in all extremities, including distally in the hands and feet.  No atrophy, fasciculations or abnormal movements.  No pronator drift.  Tone is normal.    MSRs:  Reflexes are 2+/4 in the arms, trace at the knees, and absent at the ankles.  SENSORY:  Intact to vibration at MCP and knees.  Absent vibration at the ankles and great toe.  Temperature and pin  prick absent distal to mid-calf into the feet.  Rhomberg sign is negative (improved)  COORDINATION/GAIT:  Normal finger-to- nose-finger.  Intact rapid alternating  movements bilaterally.  Gait mildly wide based and stable, unassisted.  Stressed gait intact.  She is unsteady with tandem gait.   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  MRI cervical and thoracic spine 07/17/2023: 1. Partially imaged bilateral lung opacities, concerning for pneumonia. Recommend chest radiograph or chest CT to further characterize. 2. Otherwise, normal MRI of the cervical and thoracic spine.  MRI left femur 07/16/2023 1. There is edema within the majority of the muscles of the left buttock and thigh, consistent with the expected nonspecific myositis. This may be from an inflammatory, infectious, drug related, or posttraumatic etiologies. Muscle edema femoral are general myopathy can also be from neurogenic etiology. 2. Edema within muscles innervated by the superior gluteal nerve including gluteus medius and gluteus minimus muscles. However, no significant edema within the tensor fascia lata innervated by the superior gluteal nerve. 3. Edema within muscles innervated by the obturator nerve, including obturator externus, and the left adductor longus, brevis, and magnus. However, no significant edema within the gracilis that is innervated by the anterior division of the obturator nerve. 4. Edema within muscles innervated by the tibial nerve, including the long head of the biceps femoris muscle, semimembranosus, medial and lateral head of the gastrocnemius muscles. However, no significant edema within the semitendinosis innervated by the tibial  nerve. 5. Edema within quadriceps muscles innervated by muscular branches of the femoral nerve, including the rectus femoris, vastus medialis, vastus intermedius, and vastus lateralis muscles. However, no significant edema within the sartorius muscle intermediate about the femoral nerve.   MRI lumbar spine 07/15/2023 Stable and essentially normal Lumbar MRI, without and with contrast.  MRI brain 07/15/2023:  Normal  Total time spent reviewing records, interview, history/exam, documentation, and coordination of care on day of encounter:  30 minutes     Thank you for allowing me to participate in patient's care.  If I can answer any additional questions, I would be pleased to do so.    Sincerely,    Braedyn Kauk K. Tobie, DO

## 2023-12-27 NOTE — Patient Instructions (Addendum)
 Tiffany Velasquez is unable to perform heel-to-toe walking due to neuropathy.  Continue 600mg  in the morning, 600mg  in the afternoon, and 900mg  at bedtime Continue duloxetine  (Cymbalta ) 30mg  twice daily

## 2024-01-01 DIAGNOSIS — H30033 Focal chorioretinal inflammation, peripheral, bilateral: Secondary | ICD-10-CM | POA: Diagnosis not present

## 2024-01-02 NOTE — Progress Notes (Deleted)
 Follow Up Note  RE: Tiffany Velasquez MRN: [id number] DOB: [birth year] Date of Office Visit: 01/03/2024  Referring provider: Madelon Donald HERO, DO Primary care provider: Madelon Donald HERO, DO  Chief Complaint: No chief complaint on file.  History of Present Illness: I had the pleasure of seeing Tiffany Velasquez for a follow up visit at the [facility] on 01/03/2024. She is a 28 y.o. female, who is being followed for asthma, allergic rhinitis, atopic dermatitis and food allergies. Her previous allergy  office visit was on 12/14/2022 with Wanda Craze FNP. Today is a regular follow up visit.  Discussed the use of AI scribe software for clinical note transcription with the patient, who gave verbal consent to proceed.  History of Present Illness            ***  Assessment and Plan: Tiffany Velasquez is a 29 y.o. female with: 1. Moderate persistent asthma without complication   2. Seasonal and perennial allergic rhinitis   3. Anaphylactic shock due to food, subsequent encounter   4. Intrinsic atopic dermatitis       No orders of the defined types were placed in this encounter.     Patient Instructions  1. Moderate persistent asthma - controlled - Daily controller medication(s): Symbicort  160/4.5 two puffs twice daily with spacer - Prior to physical activity: albuterol  2 puffs 10-15 minutes before physical activity. - Rescue medications: albuterol  4 puffs every 4-6 hours as needed and albuterol  nebulizer one vial every 4-6 hours as needed - Asthma control goals:  * Full participation in all desired activities (may need albuterol  before activity) * Albuterol  use two time or less a week on average (not counting use with activity) * Cough interfering with sleep two time or less a month * Oral steroids no more than once a year * No hospitalizations   2. Chronic allergic rhinitis - .  -She would like to get lab work to follow up on her allergies rather than skin testing. We  will call you with results once they are back - Continue with Singulair , cetirizine , ipratropium, and Astelin .   - Continue with allergy  shots at the same schedule.    3. Atopic dermatitis - Continue with moisturizing twice daily. - Continue with [employer].   - Continue with your medicated ointments as needed.    4. Multiple food allergies (peanuts, shellfish, multiple fruits) - EpiPen  is up to date.  - Continue to avoid peanuts, tree nuts, and shellfish.   -We will get lab work to follow-up on your food allergies.  We will call you with results once they are back. Assessment and Plan              No follow-ups on file.  No orders of the defined types were placed in this encounter.  Lab Orders  No laboratory test(s) ordered today    Diagnostics: Spirometry:  Tracings reviewed. Her effort: {Blank single:19197::Good reproducible efforts.,It was hard to get consistent efforts and there is a question as to whether this reflects a maximal maneuver.,Poor effort, data can not be interpreted.} FVC: ***L FEV1: ***L, ***% predicted FEV1/FVC ratio: ***% Interpretation: {Blank single:19197::Spirometry consistent with mild obstructive disease,Spirometry consistent with moderate obstructive disease,Spirometry consistent with severe obstructive disease,Spirometry consistent with possible restrictive disease,Spirometry consistent with mixed obstructive and restrictive disease,Spirometry uninterpretable due to technique,Spirometry consistent with normal pattern,No overt abnormalities noted given today's efforts}.  Please see scanned spirometry results for details.  Skin Testing: {Blank single:19197::Select foods,Environmental allergy   panel,Environmental allergy  panel and select foods,Food allergy  panel,None,Deferred due to recent antihistamines use}. *** Results discussed with patient/family.   Medication List:  Current Outpatient Medications   Medication Sig Dispense Refill   acetaminophen  (TYLENOL ) 325 MG tablet Take 1-2 tablets (325-650 mg total) by mouth every 4 (four) hours as needed for mild pain (pain score 1-3).     acetaZOLAMIDE  (DIAMOX ) 250 MG tablet Take 500 mg by mouth 2 (two) times daily.     Adalimumab 40 MG/0.4ML PNKT Inject 40 mg into the skin every 14 (fourteen) days.     albuterol  (PROVENTIL ) (2.5 MG/3ML) 0.083% nebulizer solution Take 3 mLs (2.5 mg total) by nebulization every 6 (six) hours as needed for wheezing or shortness of breath. (Patient taking differently: Take 2.5 mg by nebulization every 4 (four) hours as needed for wheezing or shortness of breath.) 75 mL 1   albuterol  (VENTOLIN  HFA) 108 (90 Base) MCG/ACT inhaler Inhale 2 puffs every 4-6 hours as needed for cough, wheeze, tightness in chest, or shortness of breath 8 g 1   allopurinol  (ZYLOPRIM ) 100 MG tablet TAKE 1 TABLET(100 MG) BY MOUTH DAILY 90 tablet 2   azelastine  (ASTELIN ) 0.1 % nasal spray USE 2 SPRAYS IN EACH NOSTRIL TWICE DAILY AS NEEDED FOR RUNNY NOSE/DRAINAGE DOWN THROAT (Patient taking differently: 2 sprays 2 (two) times daily. USE 2 SPRAYS IN EACH NOSTRIL TWICE DAILY AS NEEDED FOR RUNNY NOSE/DRAINAGE DOWN THROAT) 30 mL 5   bimatoprost (LUMIGAN) 0.01 % SOLN Place 1 drop into the right eye at bedtime.     brimonidine  (ALPHAGAN ) 0.2 % ophthalmic solution Place 1 drop into both eyes 3 (three) times daily.     budesonide -formoterol  (SYMBICORT ) 160-4.5 MCG/ACT inhaler Inhale 2 puffs into the lungs 2 (two) times daily. 10.2 g 0   busPIRone  (BUSPAR ) 5 MG tablet Take 1 tablet (5 mg total) by mouth 3 (three) times daily. 270 tablet 1   cyanocobalamin  (VITAMIN B12) 1000 MCG/ML injection Inject 1 mL (1,000 mcg total) into the muscle every 30 (thirty) days. 10 mL 0   cyanocobalamin  1000 MCG tablet Take 1 tablet (1,000 mcg total) by mouth daily. 90 tablet 0   diclofenac  Sodium (VOLTAREN ) 1 % GEL Apply 4 g topically 4 (four) times daily as needed. 50 g 1    dorzolamide -timolol  (COSOPT ) 2-0.5 % ophthalmic solution Place 1 drop into both eyes 2 (two) times daily.     DULoxetine  (CYMBALTA ) 30 MG capsule Take 1 capsule (30 mg total) by mouth 2 (two) times daily. 180 capsule 1   Ensure Max Protein (ENSURE MAX PROTEIN) LIQD Take 330 mLs (11 oz total) by mouth 2 (two) times daily.     famotidine  (PEPCID ) 40 MG tablet Take 1 tablet (40 mg total) by mouth at bedtime. 90 tablet 3   fluticasone  (FLONASE ) 50 MCG/ACT nasal spray Place 1 spray into both nostrils daily. 16 each 3   folic acid  (FOLVITE ) 1 MG tablet TAKE 1 TABLET(1 MG) BY MOUTH DAILY 90 tablet 0   gabapentin  (NEURONTIN ) 300 MG capsule Take 600mg  in the morning, 600mg  in the afternoon, and 900mg  at bedtime 630 capsule 1   GAMUNEX-C 20 GM/200ML SOLN      Heparin  Na, Pork, Lock Flsh PF (BD HEPARIN  POSIFLUSH) 100 UNIT/ML SOLN Inject 100 Units into the vein daily as needed (Given by nurse at home).     lidocaine  (LMX) 4 % cream Apply topically 3 (three) times daily. 113 g 0   magnesium  oxide (MAG-OX) 400 (240 Mg) MG tablet  Take 1 tablet (400 mg total) by mouth 2 (two) times daily. 60 tablet 0   melatonin 3 MG TABS tablet Take 1 tablet (3 mg total) by mouth at bedtime as needed. 30 tablet 0   methocarbamol  (ROBAXIN ) 500 MG tablet Take 1 tablet (500 mg total) by mouth every 8 (eight) hours as needed for muscle spasms. 30 tablet 0   metoprolol  tartrate (LOPRESSOR ) 25 MG tablet TAKE 0.5 TABLET BY MOUTH TWICE DAILY 90 tablet 0   montelukast  (SINGULAIR ) 10 MG tablet Take 1 tablet (10 mg total) by mouth at bedtime. 90 tablet 1   Multiple Vitamin (MULTIVITAMIN WITH MINERALS) TABS tablet Take 1 tablet by mouth daily.     ondansetron  (ZOFRAN ) 8 MG tablet TAKE 1 TABLET(8 MG) BY MOUTH EVERY 8 HOURS AS NEEDED FOR NAUSEA OR VOMITING 90 tablet 1   pantoprazole  (PROTONIX ) 40 MG tablet Take 1 tablet (40 mg total) by mouth daily at 6 (six) AM. 30 tablet 0   polycarbophil (FIBERCON) 625 MG tablet Take 1 tablet (625 mg total)  by mouth daily. 30 tablet 0   polyethylene glycol (MIRALAX ) 17 g packet Take 17 g by mouth daily. 30 each 6   potassium chloride  (KLOR-CON  M) 10 MEQ tablet Take 3 tablets (30 mEq total) by mouth 2 (two) times daily. 180 tablet 0   potassium chloride  (KLOR-CON ) 10 MEQ tablet TAKE 3 TABLETS BY MOUTH TWICE DAILY 360 tablet 3   RETIN-A  MICRO 0.04 % gel Apply 1 application  topically daily as needed.     RHOPRESSA  0.02 % SOLN Place 1 drop into the right eye at bedtime.     sucralfate  (CARAFATE ) 1 GM/10ML suspension Take 10 mLs (1 g total) by mouth 2 (two) times daily. 60 mL 0   thiamine  (VITAMIN B1) 100 MG tablet Take 1 tablet (100 mg total) by mouth daily. 90 tablet 0   traMADol  (ULTRAM ) 50 MG tablet Take 1 tablet (50 mg total) by mouth every 12 (twelve) hours as needed for severe pain (pain score 7-10). 30 tablet 0   vitamin A  3 MG (10000 UNITS) capsule Take 1 capsule (10,000 Units total) by mouth daily. 30 capsule 0   Vitamin D , Ergocalciferol , (DRISDOL ) 1.25 MG (50000 UNIT) CAPS capsule TAKE 1 CAPSULE BY MOUTH EVERY 7 DAYS 5 capsule 1   No current facility-administered medications for this visit.   Allergies: Allergies  Allergen Reactions   Other Itching, Rash and Swelling    Seafood,tomato paste, peanut  butter, peaches, oranges, apples Throat swelling  Dust mite, oak trees, grass- causes rash, itching   Peanut -Containing Drug Products Anaphylaxis   Shellfish Allergy  Anaphylaxis    Throat swelling    Apple Juice Rash   Orange Fruit [Citrus] Rash   Peach Flavoring Agent (Non-Screening) Rash   Tomato Rash, Hives and Itching   Tree Extract Rash   I reviewed her past medical history, social history, family history, and environmental history and no significant changes have been reported from her previous visit.  Review of Systems  Constitutional:  Negative for appetite change, chills, fever and unexpected weight change.  HENT:  Negative for congestion and rhinorrhea.   Eyes:  Negative for  itching.  Respiratory:  Negative for cough, chest tightness, shortness of breath and wheezing.   Cardiovascular:  Negative for chest pain.  Gastrointestinal:  Negative for abdominal pain.  Genitourinary:  Negative for difficulty urinating.  Skin:  Negative for rash.  Allergic/Immunologic: Positive for environmental allergies and food allergies.  Neurological:  Negative  for headaches.    Objective: There were no vitals taken for this visit. There is no height or weight on file to calculate BMI. Physical Exam Vitals and nursing note reviewed.  Constitutional:      Appearance: Normal appearance. She is well-developed.  HENT:     Head: Normocephalic and atraumatic.     Right Ear: Tympanic membrane and external ear normal.     Left Ear: Tympanic membrane and external ear normal.     Nose: Nose normal.     Mouth/Throat:     Mouth: Mucous membranes are moist.     Pharynx: Oropharynx is clear.  Eyes:     Conjunctiva/sclera: Conjunctivae normal.  Cardiovascular:     Rate and Rhythm: Normal rate and regular rhythm.     Heart sounds: Normal heart sounds. No murmur heard.    No friction rub. No gallop.  Pulmonary:     Effort: Pulmonary effort is normal.     Breath sounds: Normal breath sounds. No wheezing, rhonchi or rales.  Musculoskeletal:     Cervical back: Neck supple.  Skin:    General: Skin is warm.     Findings: No rash.  Neurological:     Mental Status: She is alert and oriented to person, place, and time.  Psychiatric:        Behavior: Behavior normal.    Previous notes and tests were reviewed. The plan was reviewed with the patient/family, and all questions/concerned were addressed.  It was my pleasure to see Tiffany Velasquez today and participate in her care. Please feel free to contact me with any questions or concerns.  Sincerely,  Orlan Cramp, DO Allergy  & Immunology  [facility]  [place] office: [phone] [facility] [place] office:  [phone]

## 2024-01-03 ENCOUNTER — Ambulatory Visit: Admitting: Allergy

## 2024-01-09 ENCOUNTER — Other Ambulatory Visit: Payer: Self-pay | Admitting: Family Medicine

## 2024-01-09 DIAGNOSIS — Z01818 Encounter for other preprocedural examination: Secondary | ICD-10-CM | POA: Diagnosis not present

## 2024-01-09 DIAGNOSIS — H4043X4 Glaucoma secondary to eye inflammation, bilateral, indeterminate stage: Secondary | ICD-10-CM | POA: Diagnosis not present

## 2024-01-09 DIAGNOSIS — H209 Unspecified iridocyclitis: Secondary | ICD-10-CM | POA: Diagnosis not present

## 2024-01-12 ENCOUNTER — Telehealth: Payer: Self-pay | Admitting: Physical Medicine & Rehabilitation

## 2024-01-12 NOTE — Telephone Encounter (Signed)
 Patient would like for her insurance to be sent again for her Qutenza patches.  She called [facility] and they told her the insurance denied it.

## 2024-01-16 DIAGNOSIS — H209 Unspecified iridocyclitis: Secondary | ICD-10-CM | POA: Diagnosis not present

## 2024-01-16 DIAGNOSIS — H4041X3 Glaucoma secondary to eye inflammation, right eye, severe stage: Secondary | ICD-10-CM | POA: Diagnosis not present

## 2024-01-16 DIAGNOSIS — I10 Essential (primary) hypertension: Secondary | ICD-10-CM | POA: Diagnosis not present

## 2024-01-16 DIAGNOSIS — H4043X4 Glaucoma secondary to eye inflammation, bilateral, indeterminate stage: Secondary | ICD-10-CM | POA: Diagnosis not present

## 2024-01-16 DIAGNOSIS — K219 Gastro-esophageal reflux disease without esophagitis: Secondary | ICD-10-CM | POA: Diagnosis not present

## 2024-01-16 DIAGNOSIS — N179 Acute kidney failure, unspecified: Secondary | ICD-10-CM | POA: Diagnosis not present

## 2024-01-16 DIAGNOSIS — Z79899 Other long term (current) drug therapy: Secondary | ICD-10-CM | POA: Diagnosis not present

## 2024-01-16 DIAGNOSIS — Z8701 Personal history of pneumonia (recurrent): Secondary | ICD-10-CM | POA: Diagnosis not present

## 2024-01-17 DIAGNOSIS — H209 Unspecified iridocyclitis: Secondary | ICD-10-CM | POA: Diagnosis not present

## 2024-01-17 DIAGNOSIS — H4043X4 Glaucoma secondary to eye inflammation, bilateral, indeterminate stage: Secondary | ICD-10-CM | POA: Diagnosis not present

## 2024-01-18 NOTE — Progress Notes (Unsigned)
   SUBJECTIVE:   CHIEF COMPLAINT / HPI:   Discussed the use of AI scribe software for clinical note transcription with the patient, who gave verbal consent to proceed.  History of Present Illness CHARMION HAPKE is a 28 year old female who presents for weight management and medication refills. She is accompanied by her mother, Montie.  Weight gain and appetite - Weight gain with concern for maintaining a healthy weight - Appetite is variable, still with some lack of appetite, especially when taking vitamins. - Typically consumes two meals per day - still on PPI, famotidine   Vitamin deficiency induced neuropathy - Thigh pain with severity of 5-6 out of 10 - Pain managed with gabapentin , ibuprofen , and tramadol  as needed - Persistent numbness and tingling in legs - Improved sensation in feet, back to driving - follows with Neurology, last appt earlier this month.  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 Some residual numbness and tingling in lower legs. Sensation grossly intact.   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 Continue supplements. Await Rheum eval. Continue to follow with neurology, PMR Folate deficiency  Vitamin D  deficiency  Vitamin A  deficiency  Vitamin B12 deficiency  Encounter for  immunization Flu shot given today. Morbid obesity (HCC) Contributing to HTN, though well controlled. Discussion had today regarding options however no medical-assisted option safe for her currently given h/o vitamin-induced neuropathy and malnutrition with prolonged hospitalization earlier this year and still with appetite not fully regained. Recommend continued efforts at nutritious, well balanced meals and activity as tolerated. Labs today.  Myositis of left lower extremity, unspecified myositis type Continues to improve. Decreased reliance on assistive devices. Pain manageable. - Continue current pain management and physical activity. Hypertension, unspecified type At goal. Will transition to ER metoprolol  to reduce pill burden.  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 Continue to follow with Optho. Adjustment disorder with mixed anxiety and depressed mood Significant mood improvement with increased activity and social engagement. Current medication regimen effective. - Continue current medication regimen.  F/u 3 months. At f/u, recommend updating PCV20, HPV vaccines.   Donald CHRISTELLA Lai, DO

## 2024-01-19 ENCOUNTER — Ambulatory Visit: Admitting: Family Medicine

## 2024-01-19 ENCOUNTER — Encounter: Payer: Self-pay | Admitting: Family Medicine

## 2024-01-19 VITALS — BP 130/85 | HR 70 | Ht 70.0 in | Wt 383.6 lb

## 2024-01-19 DIAGNOSIS — I10 Essential (primary) hypertension: Secondary | ICD-10-CM

## 2024-01-19 DIAGNOSIS — E539 Vitamin B deficiency, unspecified: Secondary | ICD-10-CM

## 2024-01-19 DIAGNOSIS — E538 Deficiency of other specified B group vitamins: Secondary | ICD-10-CM

## 2024-01-19 DIAGNOSIS — E559 Vitamin D deficiency, unspecified: Secondary | ICD-10-CM | POA: Diagnosis not present

## 2024-01-19 DIAGNOSIS — H3093 Unspecified chorioretinal inflammation, bilateral: Secondary | ICD-10-CM

## 2024-01-19 DIAGNOSIS — M609 Myositis, unspecified: Secondary | ICD-10-CM | POA: Diagnosis not present

## 2024-01-19 DIAGNOSIS — E509 Vitamin A deficiency, unspecified: Secondary | ICD-10-CM | POA: Diagnosis not present

## 2024-01-19 DIAGNOSIS — Z23 Encounter for immunization: Secondary | ICD-10-CM

## 2024-01-19 DIAGNOSIS — G63 Polyneuropathy in diseases classified elsewhere: Secondary | ICD-10-CM

## 2024-01-19 DIAGNOSIS — F4323 Adjustment disorder with mixed anxiety and depressed mood: Secondary | ICD-10-CM

## 2024-01-19 MED ORDER — DULOXETINE HCL 30 MG PO CPEP: 30.0000 mg | ORAL_CAPSULE | Freq: Two times a day (BID) | ORAL | 1 refills | Status: AC

## 2024-01-19 MED ORDER — CYANOCOBALAMIN 1000 MCG PO TABS: 1000.0000 ug | ORAL_TABLET | Freq: Every day | ORAL | 3 refills | Status: DC

## 2024-01-19 MED ORDER — METOPROLOL SUCCINATE ER 25 MG PO TB24: 25.0000 mg | ORAL_TABLET | Freq: Every day | ORAL | 0 refills | Status: DC

## 2024-01-19 MED ORDER — CALCIUM POLYCARBOPHIL 625 MG PO TABS: 625.0000 mg | ORAL_TABLET | Freq: Every day | ORAL | 1 refills | Status: AC

## 2024-01-19 MED ORDER — BUSPIRONE HCL 5 MG PO TABS: 5.0000 mg | ORAL_TABLET | Freq: Three times a day (TID) | ORAL | 1 refills | Status: DC

## 2024-01-19 MED ORDER — POLYETHYLENE GLYCOL 3350 17 G PO PACK: 17.0000 g | PACK | Freq: Every day | ORAL | 3 refills | Status: AC

## 2024-01-19 MED ORDER — FOLIC ACID 1 MG PO TABS: 1.0000 mg | ORAL_TABLET | Freq: Every day | ORAL | 3 refills | Status: AC

## 2024-01-19 NOTE — Assessment & Plan Note (Addendum)
 Continues to improve. Decreased reliance on assistive devices. Pain manageable. - Continue current pain management and physical activity.

## 2024-01-19 NOTE — Patient Instructions (Signed)
 It was great to see you!  Our plans for today:  - We sent refills to your pharmacy.  - Come back for labs next week BEFORE your B12 shot.  - Come back in 3 months.   Take care and seek immediate care sooner if you develop any concerns.   Dr. Alcie Runions

## 2024-01-19 NOTE — Assessment & Plan Note (Addendum)
 Check labs. Continue supplements. Await Rheum eval. Continue to follow with neurology, PMR

## 2024-01-19 NOTE — Assessment & Plan Note (Addendum)
 Contributing to HTN, though well controlled. Discussion had today regarding options however no medical-assisted option safe for her currently given h/o vitamin-induced neuropathy and malnutrition with prolonged hospitalization earlier this year and still with appetite not fully regained. Recommend continued efforts at nutritious, well balanced meals and activity as tolerated. Labs today.

## 2024-01-19 NOTE — Assessment & Plan Note (Addendum)
 Significant mood improvement with increased activity and social engagement. Current medication regimen effective. - Continue current medication regimen.

## 2024-01-19 NOTE — Assessment & Plan Note (Signed)
 With recent surgery. Continue to follow with Optho.

## 2024-01-19 NOTE — Assessment & Plan Note (Addendum)
 At goal. Will transition to ER metoprolol  to reduce pill burden.

## 2024-01-22 ENCOUNTER — Other Ambulatory Visit

## 2024-01-22 DIAGNOSIS — G63 Polyneuropathy in diseases classified elsewhere: Secondary | ICD-10-CM | POA: Diagnosis not present

## 2024-01-22 DIAGNOSIS — E538 Deficiency of other specified B group vitamins: Secondary | ICD-10-CM | POA: Diagnosis not present

## 2024-01-22 DIAGNOSIS — E559 Vitamin D deficiency, unspecified: Secondary | ICD-10-CM | POA: Diagnosis not present

## 2024-01-22 DIAGNOSIS — E539 Vitamin B deficiency, unspecified: Secondary | ICD-10-CM | POA: Diagnosis not present

## 2024-01-22 DIAGNOSIS — E509 Vitamin A deficiency, unspecified: Secondary | ICD-10-CM | POA: Diagnosis not present

## 2024-01-22 LAB — POCT GLYCOSYLATED HEMOGLOBIN (HGB A1C): Hemoglobin A1C: 5.1 % (ref 4.0–5.6)

## 2024-01-24 DIAGNOSIS — H209 Unspecified iridocyclitis: Secondary | ICD-10-CM | POA: Diagnosis not present

## 2024-01-24 DIAGNOSIS — H4043X4 Glaucoma secondary to eye inflammation, bilateral, indeterminate stage: Secondary | ICD-10-CM | POA: Diagnosis not present

## 2024-01-24 LAB — BASIC METABOLIC PANEL WITH GFR

## 2024-01-24 NOTE — Progress Notes (Signed)
 Ophthalmology Department Clinical Visit Note     CHIEF COMPLAINT Patient presents for Post-op Exam   HISTORY OF PRESENT ILLNESS: Tiffany Velasquez is a 28 y.o. old female who presents to the clinic today for:   HPI     Post-op Exam   In right eye (Laser CPC ).  Date of Procedure:: 01/16/2024.        Comments   Red gritty feeling sore.       Last edited by Grayce Toni Barrack, COA on 01/24/2024  2:23 PM.     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 TMAX in 30's per patient (30 OU here 06/18/2020) Use Tonopen  Humphrey visual field interpretation  02/28/2022: Good reliability OU OD: Normal - VFI 99; MD -2.2 OS: Generalized suppression; no focal glaucoma defects - VFI 100; MD -4.8  Humphrey visual field interpretation  02/17/2021: Good reliability OU OD: Normal -VFI 98; MD -4.9 OS: Normal -VFI 100; MD -3.1  Humphrey visual field interpretation  04/08/2020: Good reliability OU OD: Normal (wih general reduction of sensitivity) - VFI 100; MD -5.1 OS: Normal - VFI 95; MD -3.3  OCT RNFL -  02/17/2021: Reliability:  Unreliable OD, Good OS, SS  6/10 OD,   6/10 OS OD:  Unusable OS:  Normal double-hump pattern on profile view; sup avg 115, inf avg 131   CURRENT MEDICATIONS: Current Outpatient Medications on File Prior to Visit (Ophthalmic Drugs)  Medication Sig  . brimonidine  (ALPHAGAN ) 0.2 % ophthalmic solution Administer 1 drop into left eye 3 (three) times a day.  . dorzolamide -timoloL  (COSOPT ) 22.3-6.8 mg/mL ophthalmic solution Administer 1 drop into left eye 2 (two) times a day.   Current Outpatient Medications on File Prior to Visit (Other)  Medication Sig  . acetaminophen  (TYLENOL ) 325 mg tablet Take 325-650 mg by mouth as needed.  SABRA adalimumab (HUMIRA) 40 mg/0.4  mL pnkt injection Inject 0.4 mLs (40 mg total) into the skin every 14 days.  SABRA adalimumab (Humira,CF, Pen) 40 mg/0.4 mL pnkt injection Inject 0.4 mLs (40 mg total) into the skin every 14 days.  . albuterol  2.5 mg /3 mL (0.083 %) nebulizer solution Inhale 2.5 mg as needed.  . albuterol  HFA (PROVENTIL  HFA;VENTOLIN  HFA;PROAIR  HFA) 90 mcg/actuation inhaler Inhale 2 puffs every 6 (six) hours as needed for wheezing or shortness of breath.  . allopurinoL  (ZYLOPRIM ) 100 mg tablet Take 100 mg by mouth daily.  . amLODIPine  (NORVASC ) 5 mg tablet Take 5 mg by mouth nightly.  SABRA azelaic acid (Azelex) 20 % crea   . azelastine  (ASTELIN ) 137 mcg (0.1 %) nasal spray 2 sprays 2 (two) times a day. Duplicate  . azelastine  (ASTELIN ) 137 mcg (0.1 %) nasal spray Administer 2 sprays into affected nostril(s).  . BD PosiFlush Normal Saline 0.9 injection   . benzonatate  (TESSALON ) 100 mg capsule as needed.  . budesonide -formoteroL  (Symbicort ) 160-4.5 mcg/actuation inhaler Inhale 2 puffs 2 (two) times a day. Duplicate  . budesonide -formoteroL  (SYMBICORT ;BREYNA ) 160-4.5 mcg/actuation inhaler Inhale 2 Inhalations.  . busPIRone  (BUSPAR ) 5 mg tablet Take 5 mg by mouth.  . ciclopirox (LOPROX) 0.77 % cream   . clindamycin (CLEOCIN T) 1 % lotion   . cyanocobalamin  (VITAMIN B12) 1,000 mcg tablet Take 1,000 mcg by mouth daily.  . cyanocobalamin  (VITAMIN B12)  1,000 mcg/mL injection Inject 1,000 mcg into the muscle.  . dexlansoprazole  (DEXILANT ) 60 mg DR capsule Take by mouth daily.  . DULoxetine  (CYMBALTA ) 30 mg capsule Take 30 mg by mouth daily.  . EPINEPHrine  (EPIPEN ) 0.3 mg/0.3 mL injection syringe Inject into the thigh.  . ergocalciferol  (VITAMIN D2) 1,250 mcg (50,000 unit) capsule Take 50,000 Units by mouth once a week.  . famotidine  (PEPCID ) 40 mg tablet Take 40 mg by mouth at bedtime.  . fluocinonide (LIDEX) 0.05 % cream Apply 1 Application topically daily.  . fluticasone  propionate (FLONASE ) 50 mcg/spray nasal spray  Administer 1 spray into each nostril 2 (two) times a day.  . fluticasone  propionate (FLONASE ) 50 mcg/spray nasal spray Administer 1 spray into affected nostril(s).  . folic acid  (FOLVITE ) 1 mg tablet Take 1 mg by mouth daily.  . gabapentin  (NEURONTIN ) 300 mg capsule Take by mouth daily.  . Gamunex-C 20 gram/200 mL (10 %) infusion Once a month for 4 days  . halobetasol 0.05 % oint   . heparin  flush 100 unit/mL syrg Infuse 100 Units into a venous catheter.  . HYDROcodone -acetaminophen  (NORCO) 5-325 mg per tablet TAKE 1 TABLET BY MOUTH EVERY 4 HOURS FOR UP TO 5 DAYS AS NEEDED  . hydrOXYzine  (ATARAX ) 10 mg tablet Take 10 mg by mouth daily as needed for itching.  . immune globulin , human, (Gamunex-C) 10 gram/100 mL (10 %) infusion   . ipratropium (ATROVENT ) 42 mcg (0.06 %) spry nasal spray Administer into affected nostril(s).  SABRA ketoconazole (NIZORAL) 2 % cream   . Klor-Con  20 mEq packet DISSOLVE CONTENTS OF 1 PACKET IN LIQUID AND TAKE BY MOUTH DAILY  . lactose-reduced food (ENSURE MAX PROTEIN ORAL) Take by mouth daily.  . lidocaine  (LMX) 4 % cream Apply topically.  . MAGNESIUM  OXIDE ORAL Take by mouth daily.  . melatonin 3 mg tablet Take 3 mg by mouth at bedtime.  . methocarbamoL  (ROBAXIN ) 500 mg tablet Take 500 mg by mouth as needed.  . metoprolol  tartrate (LOPRESSOR ) 25 mg tablet Take 12.5 mg by mouth daily.  . mometasone  (ELOCON ) 0.1 % cream Apply 1 Application topically.  . montelukast  (SINGULAIR ) 10 mg tablet Take 10 mg by mouth nightly. duplicate  . montelukast  (SINGULAIR ) 10 mg tablet Take 10 mg by mouth nightly.  . MULTIVITAMIN ORAL Take by mouth daily.  . ondansetron  (ZOFRAN ) 8 mg tablet   . pantoprazole  (PROTONIX ) 40 mg EC tablet Take 40 mg by mouth daily.  . polycarbophil (FIBERCON) 625 mg tablet Take 625 mg by mouth daily.  . polyethylene glycol (MIRALAX ) 17 gram powd powder as needed.  . potassium chloride  (KLOR-CON ) 10 mEq ER tablet Take 30 mEq by mouth. Duplicate  . potassium  chloride (KLOR-CON ) 10 mEq ER tablet Take 3 tablets by mouth in the morning and 3 tablets in the evening.  . sucralfate  (CARAFATE ) 100 mg/mL oral suspension Take 1 g by mouth daily.  . thiamine  (VITAMIN B1) 100 mg tablet Take 100 mg by mouth daily.  . traMADoL  (ULTRAM ) 50 mg tablet Take 50 mg by mouth as needed.    Referring physician: No referring provider defined for this encounter.  ALLERGIES Allergies[1]  PAST MEDICAL HISTORY Medical History[2] Surgical History[3]  FAMILY HISTORY Family History[4]  SOCIAL HISTORY Social History[5]      OPHTHALMIC EXAM:  Base Eye Exam     Visual Acuity (Snellen - Linear)       Right Left   Dist [place] HM 20/60  Tonometry (2:44 PM)       Right Left   Pressure 6 16         Neuro/Psych     Oriented x3: Yes   Mood/Affect: Normal           Slit Lamp and Fundus Exam     Slit Lamp Exam       Right Left   Lids/Lashes Normal Normal   Conjunctiva/Sclera 1+ vessels at limnus 1+ vessels   Cornea Clear Clear   Anterior Chamber Deep with 2+ cell Deep with trace cell   Iris nilated Posterior synechiae   Lens PC IOL with 2+ PCO Clear         Fundus Exam       Right Left   Disc Rim intact, no heme, 3+ pallor, disc 2.2 No heme, no pallor, disc 2.2, Neovascularization   C/D Ratio 1.0 3 flare -3k            IMAGING AND PROCEDURES:  @EYRES @     ASSESSMENT: 1. Uveitic glaucoma of both eyes, indeterminate stage       Post-op CPC OD - Looks great POW1 Tonopen probably more accurate for her. IOP markedly improved OD Continue drops the same Recheck in 2 weeks in [place]  PLAN:  Brimonidine  x3 OS only Dorzolamide -Timolol  x2 OS only Pred x4 OD Atropine x1 OD  Ophthalmic Meds Ordered this visit:  New Medications Ordered This Visit  Medications  . prednisoLONE  acetate (PRED FORTE ) 1 % ophthalmic suspension    Sig: Administer 1 drop into the right eye 4 (four) times a day.    Dispense:  10 mL    Refill:   6  . atropine 1 % ophthalmic solution    Sig: Administer 1 drop into the right eye daily.    Dispense:  5 mL    Refill:  2       This document serves as a record of services personally performed by J. Thresa Bracket. It was created on their behalf by My Rayleen, a trained medical scribe. The creation of this record is the provider's dictation and/or activities during the visit. I agree the documentation is accurate and complete.  Electronically signed by: JINNY Thresa Bracket, MD 01/27/2024 9:00 PM         [1] Allergies Allergen Reactions  . Ascorbic Acid Anaphylaxis  . Nut - Unspecified Anaphylaxis    Tree nuts  . Peanut  Anaphylaxis, Hives, Itching and Swelling    Throat swelling  . Shellfish Containing Products Anaphylaxis and Swelling    Throat swelling  . Apple Hives and Itching  . Orange Hives and Itching  . Apple Juice Rash  . Citrus And Derivatives Rash  . Peach Rash, Hives and Itching  . Peach Flavor Rash  . Tomato Hives, Itching and Rash  . Tree And Shrub Pollen Rash  [2] Past Medical History: Diagnosis Date  . Adjustment disorder with mixed anxiety and depressed mood 07/24/2023  . Asthma   . 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  . 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 Folate deficiency 07/15/2023  . Gastroesophageal reflux disease 11/08/2022  . Generalized weakness 07/08/2023  . Glaucoma   . Gout 05/29/2023  . High risk medication use 07/13/2018  . Hypertension 06/22/2012  . Hypokalemia 07/09/2023  . Intrinsic atopic dermatitis 01/16/2017  . Iritis   . Lattice degeneration   . Morbid obesity (CMD) 10/05/2009   Qualifier: Diagnosis of   By: Flint  MD,  Corean    . 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 Neuropathy 06/05/2023  . 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 Paresthesia 07/08/2023  . 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  . 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 Port-A-Cath in place 07/15/2023  . Posterior synechiae (iris), left eye   . Retinal edema 07/13/2018  . Severe eczema 05/25/2006   Qualifier: Diagnosis of   By: WATT, JOANNE    . 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 Uveitic glaucoma of both eyes, 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  . Vitamin B12 deficiency 07/16/2023  . Vitamin D  deficiency 07/18/2023  [3] Past Surgical History: Procedure Laterality Date  . CATARACT EXTRACTION W/  INTRAOCULAR LENS IMPLANT Right 02/12/2019   Procedure: PHACOEMULSIFICATION PC / IOL GMO;  Surgeon: Donnice Sickles, MD;  Location: [facility] OUTPATIENT OR;  Service: Ophthalmology;  Laterality: Right;  . IR PORT-A-CATH PLACEMENT  2021  . TRABECULECTOMY Right 01/16/2024   Laser CPC (ciliary photocoagulation - 33289) performed by Reyes Thresa Bracket, MD at [facility] OR  [4] Family History Problem Relation Name Age of Onset  . Diabetes Father    . Hypertension Father    . Glaucoma Paternal Aunt    . Glaucoma Maternal Grandfather    . Glaucoma Other    . Blindness Other    [5] Social History Tobacco Use  . Smoking status: Never    Passive exposure: Past  . Smokeless tobacco: Never  Vaping Use  . Vaping status: Never Used  . Passive vaping exposure: Yes  Substance Use Topics  . Drug use: Never

## 2024-01-25 DIAGNOSIS — H30033 Focal chorioretinal inflammation, peripheral, bilateral: Secondary | ICD-10-CM | POA: Diagnosis not present

## 2024-01-26 ENCOUNTER — Ambulatory Visit: Payer: Self-pay | Admitting: Family Medicine

## 2024-01-29 ENCOUNTER — Encounter: Payer: Self-pay | Admitting: Radiology

## 2024-01-29 LAB — VITAMIN B12: Vitamin B-12: 1739 pg/mL — ABNORMAL HIGH (ref 232–1245)

## 2024-01-29 LAB — VITAMIN A: Vitamin A: 45.1 ug/dL (ref 18.9–57.3)

## 2024-01-29 LAB — BASIC METABOLIC PANEL WITH GFR

## 2024-01-29 LAB — VITAMIN D 25 HYDROXY (VIT D DEFICIENCY, FRACTURES): Vit D, 25-Hydroxy: 22.6 ng/mL — ABNORMAL LOW (ref 30.0–100.0)

## 2024-01-29 LAB — FOLATE: Folate: >20 ng/mL (ref >3.0)

## 2024-01-29 LAB — LIPID PANEL

## 2024-01-29 LAB — VITAMIN B1: Thiamine: 168 nmol/L (ref 66.5–200.0)

## 2024-01-29 NOTE — Telephone Encounter (Signed)
 Called and discussed. B12 high, she took injection after visit. Will pause b12 pill for now, continue injection. Reviewed remainder of labs   Suzann Daring, MD  [facility] Medicine Teaching Service

## 2024-01-30 ENCOUNTER — Ambulatory Visit

## 2024-02-01 ENCOUNTER — Ambulatory Visit

## 2024-02-05 ENCOUNTER — Ambulatory Visit (INDEPENDENT_AMBULATORY_CARE_PROVIDER_SITE_OTHER)

## 2024-02-05 DIAGNOSIS — H4043X4 Glaucoma secondary to eye inflammation, bilateral, indeterminate stage: Secondary | ICD-10-CM | POA: Diagnosis not present

## 2024-02-05 DIAGNOSIS — J309 Allergic rhinitis, unspecified: Secondary | ICD-10-CM | POA: Diagnosis not present

## 2024-02-05 DIAGNOSIS — H209 Unspecified iridocyclitis: Secondary | ICD-10-CM | POA: Diagnosis not present

## 2024-02-05 NOTE — Progress Notes (Signed)
 Ophthalmology Department Clinical Visit Note     CHIEF COMPLAINT Patient presents for Post-op Exam (Pt reports no vision changes at this time. )   HISTORY OF PRESENT ILLNESS: Tiffany Velasquez is a 28 y.o. old female who presents to the clinic today for:   HPI     Post-op Exam    Additional comments: Pt reports no vision changes at this time.         Comments   Current Ocular Meds: - Brimonidine  x3 OS only - Cosopt  X2 left eye only (Pt using BID both eyes) - Pred x4 OD - Atropine x1 right eye - Humira (per Dr. Maree) - Folic Acid  (per Dr. Maree)         Last edited by Marny CHRISTELLA Eartha Jones, COA on 02/05/2024  2:44 PM.      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 TMAX in 30's per patient (30 OU here 06/18/2020) Use Tonopen  Humphrey visual field interpretation  02/28/2022: Good reliability OU OD: Normal - VFI 99; MD -2.2 OS: Generalized suppression; no focal glaucoma defects - VFI 100; MD -4.8  Humphrey visual field interpretation  02/17/2021: Good reliability OU OD: Normal -VFI 98; MD -4.9 OS: Normal -VFI 100; MD -3.1  Humphrey visual field interpretation  04/08/2020: Good reliability OU OD: Normal (wih general reduction of sensitivity) - VFI 100; MD -5.1 OS: Normal - VFI 95; MD -3.3  OCT RNFL -  02/17/2021: Reliability:  Unreliable OD, Good OS, SS  6/10 OD,   6/10 OS OD:  Unusable OS:  Normal double-hump pattern on profile view; sup avg 115, inf avg 131   CURRENT MEDICATIONS: Current Outpatient Medications on File Prior to Visit (Ophthalmic Drugs)  Medication Sig  . atropine 1 % ophthalmic solution Administer 1 drop into the right eye daily.  . brimonidine  (ALPHAGAN ) 0.2 % ophthalmic solution Administer 1 drop into left eye 3 (three) times a day.  .  dorzolamide -timoloL  (COSOPT ) 22.3-6.8 mg/mL ophthalmic solution Administer 1 drop into left eye 2 (two) times a day. (Patient taking differently: Administer 1 drop into left eye 2 (two) times a day. Pt using BID OU)  . prednisoLONE  acetate (PRED FORTE ) 1 % ophthalmic suspension Administer 1 drop into the right eye 4 (four) times a day.   Current Outpatient Medications on File Prior to Visit (Other)  Medication Sig  . adalimumab (HUMIRA) 40 mg/0.4 mL pnkt injection Inject 0.4 mLs (40 mg total) into the skin every 14 days.  . folic acid  (FOLVITE ) 1 mg tablet Take 1 mg by mouth daily.  . acetaminophen  (TYLENOL ) 325 mg tablet Take 325-650 mg by mouth as needed.  SABRA adalimumab (Humira,CF, Pen) 40 mg/0.4 mL pnkt injection Inject 0.4 mLs (40 mg total) into the skin every 14 days.  . albuterol  2.5 mg /3 mL (0.083 %) nebulizer solution Inhale 2.5 mg as needed.  . albuterol  HFA (PROVENTIL  HFA;VENTOLIN  HFA;PROAIR  HFA) 90 mcg/actuation inhaler Inhale 2 puffs every 6 (six) hours as needed for wheezing or shortness of breath.  . allopurinoL  (ZYLOPRIM ) 100 mg tablet Take 100 mg by mouth daily.  . amLODIPine  (NORVASC ) 5 mg tablet Take 5 mg by mouth nightly.  SABRA azelaic acid (Azelex) 20 % crea   . azelastine  (ASTELIN ) 137 mcg (0.1 %)  nasal spray 2 sprays 2 (two) times a day. Duplicate  . azelastine  (ASTELIN ) 137 mcg (0.1 %) nasal spray Administer 2 sprays into affected nostril(s).  . BD PosiFlush Normal Saline 0.9 injection   . benzonatate  (TESSALON ) 100 mg capsule as needed.  . budesonide -formoteroL  (Symbicort ) 160-4.5 mcg/actuation inhaler Inhale 2 puffs 2 (two) times a day. Duplicate  . budesonide -formoteroL  (SYMBICORT ;BREYNA ) 160-4.5 mcg/actuation inhaler Inhale 2 Inhalations.  . busPIRone  (BUSPAR ) 5 mg tablet Take 5 mg by mouth.  . ciclopirox (LOPROX) 0.77 % cream   . clindamycin (CLEOCIN T) 1 % lotion   . cyanocobalamin  (VITAMIN B12) 1,000 mcg tablet Take 1,000 mcg by mouth daily.  . cyanocobalamin   (VITAMIN B12) 1,000 mcg/mL injection Inject 1,000 mcg into the muscle.  . dexlansoprazole  (DEXILANT ) 60 mg DR capsule Take by mouth daily.  . DULoxetine  (CYMBALTA ) 30 mg capsule Take 30 mg by mouth daily.  . EPINEPHrine  (EPIPEN ) 0.3 mg/0.3 mL injection syringe Inject into the thigh.  . ergocalciferol  (VITAMIN D2) 1,250 mcg (50,000 unit) capsule Take 50,000 Units by mouth once a week.  . famotidine  (PEPCID ) 40 mg tablet Take 40 mg by mouth at bedtime.  . fluocinonide (LIDEX) 0.05 % cream Apply 1 Application topically daily.  . fluticasone  propionate (FLONASE ) 50 mcg/spray nasal spray Administer 1 spray into each nostril 2 (two) times a day.  . fluticasone  propionate (FLONASE ) 50 mcg/spray nasal spray Administer 1 spray into affected nostril(s).  . gabapentin  (NEURONTIN ) 300 mg capsule Take by mouth daily.  . Gamunex-C 20 gram/200 mL (10 %) infusion Once a month for 4 days  . halobetasol 0.05 % oint   . heparin  flush 100 unit/mL syrg Infuse 100 Units into a venous catheter.  . HYDROcodone -acetaminophen  (NORCO) 5-325 mg per tablet TAKE 1 TABLET BY MOUTH EVERY 4 HOURS FOR UP TO 5 DAYS AS NEEDED  . hydrOXYzine  (ATARAX ) 10 mg tablet Take 10 mg by mouth daily as needed for itching.  . immune globulin , human, (Gamunex-C) 10 gram/100 mL (10 %) infusion   . ipratropium (ATROVENT ) 42 mcg (0.06 %) spry nasal spray Administer into affected nostril(s).  SABRA ketoconazole (NIZORAL) 2 % cream   . Klor-Con  20 mEq packet DISSOLVE CONTENTS OF 1 PACKET IN LIQUID AND TAKE BY MOUTH DAILY  . lactose-reduced food (ENSURE MAX PROTEIN ORAL) Take by mouth daily.  . lidocaine  (LMX) 4 % cream Apply topically.  . MAGNESIUM  OXIDE ORAL Take by mouth daily.  . melatonin 3 mg tablet Take 3 mg by mouth at bedtime.  . methocarbamoL  (ROBAXIN ) 500 mg tablet Take 500 mg by mouth as needed.  . metoprolol  tartrate (LOPRESSOR ) 25 mg tablet Take 12.5 mg by mouth daily.  . mometasone  (ELOCON ) 0.1 % cream Apply 1 Application topically.   . montelukast  (SINGULAIR ) 10 mg tablet Take 10 mg by mouth nightly. duplicate  . montelukast  (SINGULAIR ) 10 mg tablet Take 10 mg by mouth nightly.  . MULTIVITAMIN ORAL Take by mouth daily.  . ondansetron  (ZOFRAN ) 8 mg tablet   . pantoprazole  (PROTONIX ) 40 mg EC tablet Take 40 mg by mouth daily.  . polycarbophil (FIBERCON) 625 mg tablet Take 625 mg by mouth daily.  . polyethylene glycol (MIRALAX ) 17 gram powd powder as needed.  . potassium chloride  (KLOR-CON ) 10 mEq ER tablet Take 30 mEq by mouth. Duplicate  . potassium chloride  (KLOR-CON ) 10 mEq ER tablet Take 3 tablets by mouth in the morning and 3 tablets in the evening.  . sucralfate  (CARAFATE ) 100 mg/mL oral suspension Take 1 g by  mouth daily.  . thiamine  (VITAMIN B1) 100 mg tablet Take 100 mg by mouth daily.  . traMADoL  (ULTRAM ) 50 mg tablet Take 50 mg by mouth as needed.    Referring physician: No referring provider defined for this encounter.  ALLERGIES Allergies[1]  PAST MEDICAL HISTORY Medical History[2] Surgical History[3]  FAMILY HISTORY Family History[4]  SOCIAL HISTORY Social History[5]      OPHTHALMIC EXAM:  Base Eye Exam     Visual Acuity (Snellen - Linear)       Right Left   Dist [place] HM 20/60 -1   Dist ph [place]  20/40 +2         Tonometry (Tonopen, 3:09 PM)       Right Left   Pressure  15         Tonometry #2 (Applanation, 3:07 PM)       Right Left   Pressure 11 15         Pupils       Dark Light Shape React APD   Right 7 7 Round NR NR   Left   Irregular Slow None         Neuro/Psych     Oriented x3: Yes   Mood/Affect: Normal           Slit Lamp and Fundus Exam     Slit Lamp Exam       Right Left   Lids/Lashes Normal Normal   Conjunctiva/Sclera 1v 1v   Cornea Clear Clear   Anterior Chamber Deep with 1+ cell, 3+ flare Deep and quiet   Iris dilated Posterior synechiae   Lens PC IOL Clear         Fundus Exam       Right Left   Disc Rim intact, no heme, 3+  pallor, disc 2.2 No heme, no pallor, disc 2.2, Neovascularization   C/D Ratio 1.0 3 flare -3k            IMAGING AND PROCEDURES:  @EYRES @     ASSESSMENT: 1. Uveitic glaucoma of both eyes, indeterminate stage       Post-op CPC OD  Tonopen probably more accurate for her. IOP improved OD Still needs at least tid pred - significant flare. Recheck in 2 weeks in [place]  PLAN:  Brimonidine  x3 OS only Dorzolamide -Timolol  x2 OS only Pred x3 OD Atropine x1 OD for 1 more week then stop  Ophthalmic Meds Ordered this visit:  There were no meds ordered this visit.   This document serves as a record of services personally performed by J. Thresa Bracket. It was created on their behalf by My Rayleen, a trained medical scribe. The creation of this record is the provider's dictation and/or activities during the visit. I agree the documentation is accurate and complete.  Electronically signed by: JINNY Thresa Bracket, MD 02/11/2024 6:08 PM        [1] Allergies Allergen Reactions  . Ascorbic Acid Anaphylaxis  . Nut - Unspecified Anaphylaxis    Tree nuts  . Peanut  Anaphylaxis, Hives, Itching and Swelling    Throat swelling  . Shellfish Containing Products Anaphylaxis and Swelling    Throat swelling  . Apple Hives and Itching  . Orange Hives and Itching  . Apple Juice Rash  . Citrus And Derivatives Rash  . Peach Rash, Hives and Itching  . Peach Flavor Rash  . Tomato Hives, Itching and Rash  . Tree And Shrub Pollen Rash  [2] Past Medical History: Diagnosis Date  .  Adjustment disorder with mixed anxiety and depressed mood 07/24/2023  . Asthma   . 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  . 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 Folate deficiency 07/15/2023  . Gastroesophageal reflux disease 11/08/2022  . Generalized weakness 07/08/2023  . Glaucoma   . Gout 05/29/2023  . High risk medication use 07/13/2018  . Hypertension 06/22/2012  . Hypokalemia  07/09/2023  . Intrinsic atopic dermatitis 01/16/2017  . Iritis   . Lattice degeneration   . Morbid obesity (CMD) 10/05/2009   Qualifier: Diagnosis of   By: [place]  MD, Corean    . 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 Neuropathy 06/05/2023  . 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 Paresthesia 07/08/2023  . 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  . 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 Port-A-Cath in place 07/15/2023  . Posterior synechiae (iris), left eye   . Retinal edema 07/13/2018  . Severe eczema 05/25/2006   Qualifier: Diagnosis of   By: WATT, JOANNE    . 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 Uveitic glaucoma of both eyes, 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  . Vitamin B12 deficiency 07/16/2023  . Vitamin D  deficiency 07/18/2023  [3] Past Surgical History: Procedure Laterality Date  . CATARACT EXTRACTION W/  INTRAOCULAR LENS IMPLANT Right 02/12/2019   Procedure: PHACOEMULSIFICATION PC / IOL GMO;  Surgeon: Donnice Sickles, MD;  Location[facility] OUTPATIENT OR;  Service: Ophthalmology;  Laterality: Right;  . IR PORT-A-CATH PLACEMENT  2021  . TRABECULECTOMY Right 01/16/2024   Laser CPC (ciliary photocoagulation - 33289) performed by Reyes Thresa Bracket, MD at [facility] OR  [4] Family History Problem Relation Name Age of Onset  . Diabetes Father    . Hypertension Father    . Glaucoma Paternal Aunt    . Glaucoma Maternal Grandfather    . Glaucoma Other    . Blindness Other    [5] Social History Tobacco Use  . Smoking status: Never    Passive exposure: Past  . Smokeless tobacco: Never  Vaping Use  . Vaping status: Never Used  . Passive vaping exposure: Yes  Substance Use Topics  . Drug use: Never

## 2024-02-07 ENCOUNTER — Other Ambulatory Visit: Payer: Self-pay | Admitting: Physical Medicine & Rehabilitation

## 2024-02-08 ENCOUNTER — Other Ambulatory Visit: Payer: Self-pay | Admitting: Allergy & Immunology

## 2024-02-12 ENCOUNTER — Encounter: Payer: Self-pay | Admitting: Physical Medicine & Rehabilitation

## 2024-02-12 ENCOUNTER — Encounter: Attending: Physical Medicine & Rehabilitation | Admitting: Physical Medicine & Rehabilitation

## 2024-02-12 ENCOUNTER — Telehealth: Payer: Self-pay

## 2024-02-12 VITALS — BP 129/84 | HR 66 | Ht 70.0 in | Wt >= 6400 oz

## 2024-02-12 DIAGNOSIS — I10 Essential (primary) hypertension: Secondary | ICD-10-CM | POA: Diagnosis not present

## 2024-02-12 DIAGNOSIS — R7689 Other specified abnormal immunological findings in serum: Secondary | ICD-10-CM | POA: Insufficient documentation

## 2024-02-12 DIAGNOSIS — G629 Polyneuropathy, unspecified: Secondary | ICD-10-CM

## 2024-02-12 MED ORDER — TRAMADOL HCL 50 MG PO TABS: 50.0000 mg | ORAL_TABLET | Freq: Two times a day (BID) | ORAL | 5 refills | Status: AC | PRN

## 2024-02-12 MED ORDER — QUTENZA (4 PATCH) 8 % EX KIT: 4.0000 | PACK | Freq: Once | CUTANEOUS | 0 refills | Status: AC

## 2024-02-12 NOTE — Progress Notes (Signed)
 Subjective:    Patient ID: Tiffany Velasquez, female    DOB: Apr 03, 1995, 28 y.o.   MRN: [id number]  HPI Tiffany Velasquez is here for follow-up after completing treatment at [facility].  She reports she is doing well overall.  She still working with physical therapy, has completed OT for now.  She reports she is following with PCP, taking all medications as directed.  She reports she had blood work and B12 levels and other vitamin levels were improved.  She continues to have issues with balance.  Pain has improved since she was at the hospital.  She continues to have tingling pain in both feet particularly the dorsal side and her toes.  Pain is less burning than it used to be.  She continues to use gabapentin  but not taking 600 mg in the morning, 600 mg in the afternoon, and 900 mg at night.  She still uses a walker and rollator for ambulation. Occasionally still needing tramadol  when pain is very severe.  No side effects of this medication.  Interval History 02/12/24 Reports varying pain, worsened with colder weather. Describes continued neuropathy symptoms in her feet. Mornings are associated with stiffness in the leg. Experiences intermittent sharp pain. Sleep is uninterrupted. Reports occasional jumpy feeling at night, but not disruptive. Pain is worst over her dorsal feet, but also affects her plantar feet.   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   Has resumed driving as approved by PCP. Sensation has returned sufficiently in the toes and part of the foot to allow for driving.  Medication Review: - Cymbalta : Continues same dose. - Gabapentin  300 mg: 600mg  in am, 600mg  PM, and 900 mg HS. Reports it is still working. - Tramadol : Increased use over the past 3.5 weeks from rarely to twice daily due to increased pain.   Pain  Inventory Average Pain 7 Pain Right Now 6 My pain is sharp, dull, stabbing, tingling, and aching  In the last 24 hours, has pain interfered with the following? General activity 6 Relation with others 4 Enjoyment of life 4 What TIME of day is your pain at its worst? morning  and night Sleep (in general) Fair  Pain is worse with: walking, bending, and standing Pain improves with: rest, therapy/exercise, and medication Relief from Meds: 8  Family History  Problem Relation Age of Onset   Asthma Mother    Allergic rhinitis Father    Sudden death Cousin    Eczema Neg Hx    Urticaria Neg Hx    Social History   Socioeconomic History   Marital status: Single    Spouse name: Not on file   Number of children: Not on file   Years of education: Not on file   Highest education level: Associate degree: occupational, scientist, product/process development, or vocational program  Occupational History   Not on file  Tobacco Use   Smoking status: Never    Passive exposure: Never   Smokeless tobacco: Never  Vaping Use   Vaping status: Never Used  Substance and Sexual Activity   Alcohol use: Yes    Comment: rarely   Drug use: No   Sexual activity: Not Currently  Other Topics Concern   Not on file  Social History Narrative   Patient lives with parents.    Social Drivers of Health  Financial Resource Strain: Low Risk  (11/23/2023)   Overall Financial Resource Strain (CARDIA)    Difficulty of Paying Living Expenses: Not hard at all  Food Insecurity: Patient Declined (11/23/2023)   Hunger Vital Sign    Worried About Running Out of Food in the Last Year: Patient declined    Ran Out of Food in the Last Year: Patient declined  Transportation Needs: Patient Declined (11/23/2023)   PRAPARE - Administrator, Civil Service (Medical): Patient declined    Lack of Transportation (Non-Medical): Patient declined  Physical Activity: Sufficiently Active (11/23/2023)   Exercise Vital Sign    Days of Exercise per  Week: 5 days    Minutes of Exercise per Session: 30 min  Recent Concern: Physical Activity - Insufficiently Active (09/18/2023)   Exercise Vital Sign    Days of Exercise per Week: 2 days    Minutes of Exercise per Session: 20 min  Stress: No Stress Concern Present (11/23/2023)   [employer] of Occupational Health - Occupational Stress Questionnaire    Feeling of Stress: Not at all  Social Connections: Moderately Integrated (11/23/2023)   Social Connection and Isolation Panel    Frequency of Communication with Friends and Family: Three times a week    Frequency of Social Gatherings with Friends and Family: Twice a week    Attends Religious Services: More than 4 times per year    Active Member of [employer] or Organizations: No    Attends Engineer, Structural: More than 4 times per year    Marital Status: Never married  Recent Concern: Social Connections - Moderately Isolated (09/18/2023)   Social Connection and Isolation Panel    Frequency of Communication with Friends and Family: Three times a week    Frequency of Social Gatherings with Friends and Family: Twice a week    Attends Religious Services: More than 4 times per year    Active Member of Clubs or Organizations: No    Attends Engineer, Structural: Not on file    Marital Status: Never married   Past Surgical History:  Procedure Laterality Date   BIOPSY  04/04/2023   Procedure: BIOPSY;  Surgeon: Stechschulte, Deward PARAS, MD;  Location: [facility];  Service: General;;   CATARACT EXTRACTION     ESOPHAGOGASTRODUODENOSCOPY N/A 04/04/2023   Procedure: ESOPHAGOGASTRODUODENOSCOPY (EGD);  Surgeon: Lyndel Deward PARAS, MD;  Location: [facility];  Service: General;  Laterality: N/A;   PORTA CATH INSERTION     Past Surgical History:  Procedure Laterality Date   BIOPSY  04/04/2023   Procedure: BIOPSY;  Surgeon: Lyndel Deward PARAS, MD;  Location: [facility];  Service: General;;   CATARACT EXTRACTION      ESOPHAGOGASTRODUODENOSCOPY N/A 04/04/2023   Procedure: ESOPHAGOGASTRODUODENOSCOPY (EGD);  Surgeon: Lyndel Deward PARAS, MD;  Location: [facility];  Service: General;  Laterality: N/A;   PORTA CATH INSERTION     Past Medical History:  Diagnosis Date   AKI (acute kidney injury) 07/08/2023   Asthma    Eczema    Glaucoma    Hypertension    Morbid obesity (HCC)    Pneumonia of both lungs due to infectious organism 07/19/2023   Tachycardia  Multifocal PNA 07/14/2023   Transaminitis 07/15/2023   Uveitic glaucoma of both eyes, 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   BP 129/84   Pulse 66   Ht 5' 10 (1.778 m)   Wt (!) 404 lb 3.2 oz (183.3 kg)   SpO2 99%   BMI 58.00 kg/m  Opioid Risk Score:   Fall Risk Score:  `1  Depression screen [facility] 2/9     02/12/2024   11:12 AM 11/23/2023   12:03 PM 10/16/2023   11:53 AM 09/18/2023   11:04 AM 08/30/2023   10:41 AM 08/24/2023   11:33 AM 06/05/2023   10:42 AM  Depression screen PH[facility] 2/9  Decreased Interest 0 0 0 0 0 2 0  Down, Depressed, Hopeless 0 0 0 0 0 0 0  PHQ - 2 Score 0 0 0 0 0 2 0  Altered sleeping  0 0 0  0 0  Tired, decreased energy  0 0 1  2 1   Change in appetite  0 0 0  0 0  Feeling bad or failure about yourself   0 0 0  0 0  Trouble concentrating  0 0 0  0 0  Moving slowly or fidgety/restless  0 0 0  0 0  Suicidal thoughts  0 0 0  0 0  PHQ-9 Score  0  0  1   4  1    Difficult doing work/chores  Not difficult at all Somewhat difficult   Somewhat difficult Not difficult at all     Data saved with a previous flowsheet row definition      Review of Systems  Gastrointestinal:  Negative for nausea.  Musculoskeletal:  Positive for gait problem.       Bilateral  pain upper legs, left knee painl  All other systems reviewed and are negative.      Objective:   Physical Exam     02/12/2024   10:52 AM 01/19/2024   10:28 AM 12/27/2023   10:35 AM  Vitals with BMI  Height 5' 10 5' 10 5' 9  Weight 404 lbs 3 oz 383 lbs 10 oz 379  lbs  BMI 58 55.04 55.94  Systolic 129 130 880  Diastolic 84 85 79  Pulse 66 70 65     Gen: no distress, normal appearing HEENT: oral mucosa pink and moist, NCAT Chest: normal effort, normal rate of breathing Abd: soft, non-distended Ext: no edema Psych: pleasant, normal affect Skin: intact Neuro: Alert and awake, follows commands, cranial nerves II through XII grossly intact, normal speech and language No focal motor deficits noted No abnormal tone noted  Reports numbness from the mid-shin down. Sensation reported to be normal on the upper leg. - Feet: Reports numbness on the top of the feet and in the heels to light touch. The bottom of the feet are described as more tingly. Pain is worse on the top of the feet than the bottom. The heels are the most numb area.  Describes a feeling of tightness as an indicator of pain in numb areas.  Musculoskeletal:  No joint swelling or tenderness noted today     Assessment & Plan:     1. 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 Peripheral neuropathy  - Continue tramadol  50 mg twice daily as needed for breakthrough pain  -Continue cymbalta /gabapentin   -Could consider lyrica as alternative to gabapentin   -Qutenza patches: The prior authorization was denied. Will re-attempt to get approval through [facility]. Patient remains interested in trying the patches.    --Discussed Qutenza as an option for neuropathic pain control. Discussed that this is a capsaicin patch, stronger than capsaicin cream. Discussed that it is currently approved  for diabetic peripheral neuropathy and post-herpetic neuralgia, but that it has also shown benefit in treating other forms of neuropathy. Provided patient with link to site to learn more about the patch: https://www.clark.biz/. Discussed  that the patch would be placed in office and benefits usually last 3 months. Discussed that unintended exposure to capsaicin can cause severe irritation of eyes, mucous membranes, respiratory tract, and skin, but that Qutenza is a local treatment and does not have the systemic side effects of other nerve medications. Discussed that there may be pain, itching, erythema, and decreased sensory function associated with the application of Qutenza. Side effects usually subside within 1 week. A cold pack of analgesic medications can help with these side effects. Blood pressure can also be increased due to pain associated with administration of the patch.  We are recommending Qutenza 8% capsaicin to treat this patient's pain. Qutenza is the first-line treatment option recommended for diabetic peripheral neuropathy by the AACE and ADA.    3. HTN  -Controlled today, f/u with PCP   - Follow-up: Scheduled in 5 months to monitor medications. If Qutenza is approved, will schedule for the procedure at the next available appointment.

## 2024-02-12 NOTE — Telephone Encounter (Signed)
 Rx sent in to [facility]

## 2024-02-13 ENCOUNTER — Other Ambulatory Visit: Payer: Self-pay | Admitting: Family Medicine

## 2024-02-13 DIAGNOSIS — M109 Gout, unspecified: Secondary | ICD-10-CM

## 2024-02-13 NOTE — Telephone Encounter (Signed)
Discontinued at hospitalization

## 2024-02-20 ENCOUNTER — Other Ambulatory Visit: Payer: Self-pay | Admitting: Allergy & Immunology

## 2024-02-21 DIAGNOSIS — H4043X4 Glaucoma secondary to eye inflammation, bilateral, indeterminate stage: Secondary | ICD-10-CM | POA: Diagnosis not present

## 2024-02-21 DIAGNOSIS — H209 Unspecified iridocyclitis: Secondary | ICD-10-CM | POA: Diagnosis not present

## 2024-02-22 ENCOUNTER — Other Ambulatory Visit: Payer: Self-pay | Admitting: Family Medicine

## 2024-02-26 DIAGNOSIS — H30033 Focal chorioretinal inflammation, peripheral, bilateral: Secondary | ICD-10-CM | POA: Diagnosis not present

## 2024-02-26 NOTE — Telephone Encounter (Signed)
 No longer needed

## 2024-02-27 ENCOUNTER — Other Ambulatory Visit: Payer: Self-pay

## 2024-02-29 NOTE — Telephone Encounter (Signed)
 Completed course, no longer needed based on recent labs.

## 2024-03-07 ENCOUNTER — Ambulatory Visit (INDEPENDENT_AMBULATORY_CARE_PROVIDER_SITE_OTHER)

## 2024-03-07 ENCOUNTER — Other Ambulatory Visit: Payer: Self-pay | Admitting: Family Medicine

## 2024-03-07 DIAGNOSIS — J302 Other seasonal allergic rhinitis: Secondary | ICD-10-CM | POA: Diagnosis not present

## 2024-03-07 DIAGNOSIS — J309 Allergic rhinitis, unspecified: Secondary | ICD-10-CM

## 2024-03-11 NOTE — Patient Instructions (Incomplete)
 1. Moderate persistent asthma - controlled - Daily controller medication(s):START Symbicort  160/4.5 two puffs twice daily with spacer  - Prior to physical activity: albuterol  2 puffs 10-15 minutes before physical activity. - Rescue medications: albuterol  4 puffs every 4-6 hours as needed and albuterol  nebulizer one vial every 4-6 hours as needed - Asthma control goals:  * Full participation in all desired activities (may need albuterol  before activity) * Albuterol  use two time or less a week on average (not counting use with activity) * Cough interfering with sleep two time or less a month * Oral steroids no more than once a year * No hospitalizations  2. Chronic allergic rhinitis - .  - Continue with Singulair , cetirizine , ipratropium, and Astelin .   - Continue with allergy  shots at the same schedule and have access to your epinephrine  auto injector device.   3. Atopic dermatitis - Continue with moisturizing twice daily. - Continue with [employer].   - Continue with your medicated ointments as needed from your dermatologist.   4. Multiple food allergies (peanuts, shellfish, multiple fruits) - EpiPen  refill - Continue to avoid peanuts, tree nuts, and shellfish.   -We will get lab work to follow-up on your food allergies.  We will call you with results once they are back.    5.Follow up in 2 months or sooner if needed

## 2024-03-12 ENCOUNTER — Encounter: Payer: Self-pay | Admitting: Family

## 2024-03-12 ENCOUNTER — Other Ambulatory Visit: Payer: Self-pay

## 2024-03-12 ENCOUNTER — Ambulatory Visit: Admitting: Family

## 2024-03-12 VITALS — BP 124/60 | HR 71 | Temp 97.9°F | Resp 16 | Ht 70.0 in | Wt >= 6400 oz

## 2024-03-12 DIAGNOSIS — T7800XD Anaphylactic reaction due to unspecified food, subsequent encounter: Secondary | ICD-10-CM | POA: Diagnosis not present

## 2024-03-12 DIAGNOSIS — J4540 Moderate persistent asthma, uncomplicated: Secondary | ICD-10-CM | POA: Diagnosis not present

## 2024-03-12 DIAGNOSIS — J3089 Other allergic rhinitis: Secondary | ICD-10-CM | POA: Diagnosis not present

## 2024-03-12 DIAGNOSIS — L2084 Intrinsic (allergic) eczema: Secondary | ICD-10-CM

## 2024-03-12 DIAGNOSIS — J302 Other seasonal allergic rhinitis: Secondary | ICD-10-CM | POA: Diagnosis not present

## 2024-03-12 MED ORDER — ALBUTEROL SULFATE HFA 108 (90 BASE) MCG/ACT IN AERS: INHALATION_SPRAY | RESPIRATORY_TRACT | 1 refills | Status: AC

## 2024-03-12 MED ORDER — BUDESONIDE-FORMOTEROL FUMARATE 160-4.5 MCG/ACT IN AERO: INHALATION_SPRAY | RESPIRATORY_TRACT | 0 refills | Status: DC

## 2024-03-12 MED ORDER — EPINEPHRINE 0.3 MG/0.3ML IJ SOAJ: 0.3000 mg | INTRAMUSCULAR | 1 refills | Status: AC | PRN

## 2024-03-12 MED ORDER — AZELASTINE HCL 0.1 % NA SOLN: NASAL | 5 refills | Status: AC

## 2024-03-12 MED ORDER — MONTELUKAST SODIUM 10 MG PO TABS: 10.0000 mg | ORAL_TABLET | Freq: Every day | ORAL | 1 refills | Status: AC

## 2024-03-12 NOTE — Progress Notes (Cosign Needed)
 [address] [place] [address] Dept: [phone]  FOLLOW UP NOTE  Patient ID: Tiffany Velasquez, female    DOB: 28-Jun-1995  Age: 28 y.o. MRN: [id number] Date of Office Visit: 03/12/2024  Assessment  Chief Complaint: Nasal Congestion (Pt states nasal congestion) and Medication Management (Ov for refills)  HPI Tiffany Velasquez is a 28 year old female who presents today for follow-up of moderate persistent asthma, allergic rhinitis, multiple food allergies, and atopic dermatitis.  She was last seen on December 14, 2022 by myself.  She reports that since her last office visit she had surgery on her right eye to alleviate the pressure.  This surgery occurred on October 21.  She reports that Dr. Geneva did this surgery and since then her pressures have been good.  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  Moderate persistent asthma: She reports she has had some coughing, but Tessalon  Perles took care of it.  Otherwise she denies tightness in her chest, wheezing, shortness of breath, and nocturnal awakenings due to breathing problems.  Since her last office visit she has not made any trips to the emergency room or urgent care due to breathing problems or received any steroids due to her asthma.  She has been given steroid drops for her eyes.  Her breathing has been okay.  She reports that she has been switching between Symbicort  and a bluish-reddish inhaler.  She only uses the Symbicort  when she is really bad and the other 1 she uses on a daily basis.  After discussing she reports that she has been using her inhalers backwards and has been using her albuterol  as a controller medicine and her Symbicort  as a rescue inhaler.  Allergic rhinitis: She continues to receive allergy  injections per protocol.  She does feel like they have helped a lot.  She denies any problems or reactions with her allergy  injections.  She reports nasal congestion and denies  rhinorrhea and postnasal drip.  She does report that Astelin  helps with the nasal congestion.  She also has ipratropium nasal spray to use as needed and she takes cetirizine  and Singulair  daily.  She does have a humidifier and air purifier in her house.  Multiple food allergies: She continues to avoid peanuts, tree nuts, and shellfish without any accidental ingestion or use of her epinephrine  autoinjector device.  Atopic dermatitis: She continues to take medications as per her dermatologist Dr. Court.  She does have an upcoming appointment.  She reports her eczema has been fine.   Drug Allergies:  Allergies[1]  Review of Systems: Negative except as per HPI   Physical Exam: BP 124/60 (BP Location: Left Wrist, Patient Position: Sitting, Cuff Size: Normal)   Pulse 71   Temp 97.9 F (36.6 C) (Temporal)   Resp 16   Ht 5' 10 (1.778 m)   Wt (!) 421 lb 6.4 oz (191.1 kg)   SpO2 99%   BMI 60.46 kg/m    Physical Exam Constitutional:      Appearance: Normal appearance.  HENT:     Head: Normocephalic and atraumatic.     Comments: Pharynx normal, eyes normal, ears normal, nose: Bilateral lower turbinates mildly edematous with no drainage noted    Right Ear: Tympanic membrane, ear canal and external ear normal.     Left Ear: Tympanic membrane, ear canal and external ear normal.     Mouth/Throat:     Mouth: Mucous membranes are moist.     Pharynx:  Oropharynx is clear.  Eyes:     Conjunctiva/sclera: Conjunctivae normal.  Cardiovascular:     Rate and Rhythm: Regular rhythm.     Heart sounds: Normal heart sounds.  Pulmonary:     Effort: Pulmonary effort is normal.     Breath sounds: Normal breath sounds.     Comments: Lungs clear to auscultation Musculoskeletal:     Cervical back: Neck supple.  Skin:    General: Skin is warm.  Neurological:     Mental Status: She is alert and oriented to person, place, and time.  Psychiatric:        Mood and Affect: Mood normal.         Behavior: Behavior normal.        Thought Content: Thought content normal.        Judgment: Judgment normal.     Diagnostics: FVC 3.41 L (88%), FEV1 2.89 L (87%), FEV1/FVC 0.85.  Spirometry indicates normal spirometry.  Assessment and Plan: 1. Seasonal and perennial allergic rhinitis   2. Moderate persistent asthma without complication   3. Anaphylactic shock due to food, subsequent encounter   4. Intrinsic atopic dermatitis     No orders of the defined types were placed in this encounter.   Patient Instructions  1. Moderate persistent asthma - controlled - Daily controller medication(s):START Symbicort  160/4.5 two puffs twice daily with spacer  - Prior to physical activity: albuterol  2 puffs 10-15 minutes before physical activity. - Rescue medications: albuterol  4 puffs every 4-6 hours as needed and albuterol  nebulizer one vial every 4-6 hours as needed - Asthma control goals:  * Full participation in all desired activities (may need albuterol  before activity) * Albuterol  use two time or less a week on average (not counting use with activity) * Cough interfering with sleep two time or less a month * Oral steroids no more than once a year * No hospitalizations  2. Chronic allergic rhinitis - .  - Continue with Singulair , cetirizine , ipratropium, and Astelin .   - Continue with allergy  shots at the same schedule and have access to your epinephrine  auto injector device.   3. Atopic dermatitis - Continue with moisturizing twice daily. - Continue with [employer].   - Continue with your medicated ointments as needed from your dermatologist.   4. Multiple food allergies (peanuts, shellfish, multiple fruits) - EpiPen  refill - Continue to avoid peanuts, tree nuts, and shellfish.   -We will get lab work to follow-up on your food allergies.  We will call you with results once they are back.    5.Follow up in 2 months or sooner if needed    Return in about 2 months  (around 05/13/2024), or if symptoms worsen or fail to improve.    Thank you for the opportunity to care for this patient.  Please do not hesitate to contact me with questions.  Wanda Craze, FNP [facility]         [1]  Allergies Allergen Reactions   Other Itching, Rash and Swelling    Seafood,tomato paste, peanut  butter, peaches, oranges, apples Throat swelling  Dust mite, oak trees, grass- causes rash, itching   Peanut -Containing Drug Products Anaphylaxis   Shellfish Allergy  Anaphylaxis    Throat swelling    Apple Juice Rash   Orange Fruit [Citrus] Rash   Peach Flavoring Agent (Non-Screening) Rash   Tomato Rash, Hives and Itching   Tree Extract Rash

## 2024-03-17 ENCOUNTER — Other Ambulatory Visit: Payer: Self-pay | Admitting: Family Medicine

## 2024-03-19 ENCOUNTER — Other Ambulatory Visit (HOSPITAL_BASED_OUTPATIENT_CLINIC_OR_DEPARTMENT_OTHER): Payer: Self-pay

## 2024-04-03 ENCOUNTER — Other Ambulatory Visit: Payer: Self-pay | Admitting: Physician Assistant

## 2024-04-04 ENCOUNTER — Ambulatory Visit (INDEPENDENT_AMBULATORY_CARE_PROVIDER_SITE_OTHER)

## 2024-04-04 DIAGNOSIS — J302 Other seasonal allergic rhinitis: Secondary | ICD-10-CM

## 2024-04-09 ENCOUNTER — Other Ambulatory Visit: Payer: Self-pay | Admitting: Physician Assistant

## 2024-04-11 ENCOUNTER — Encounter: Payer: Self-pay | Admitting: Pharmacist

## 2024-04-11 NOTE — Progress Notes (Signed)
 This patient is appearing on a report for being at risk of failing the adherence measure for hypertension (ACEi/ARB) medications this calendar year.   Medication: amlodipine  Last fill date: 08/2023 for 30 day supply No longer appears to be taking - polypharmacy patient - likely medication list lagging - in need of update.  No [facility] visit scheduled at this time.   Reviewed medication indication, dosing, and goals of therapy.

## 2024-04-14 ENCOUNTER — Other Ambulatory Visit: Payer: Self-pay | Admitting: Family

## 2024-04-14 ENCOUNTER — Other Ambulatory Visit: Payer: Self-pay | Admitting: Family Medicine

## 2024-04-18 ENCOUNTER — Other Ambulatory Visit: Payer: Self-pay | Admitting: Family Medicine

## 2024-04-21 ENCOUNTER — Other Ambulatory Visit: Payer: Self-pay | Admitting: Family Medicine

## 2024-04-21 ENCOUNTER — Other Ambulatory Visit: Payer: Self-pay | Admitting: Physician Assistant

## 2024-05-01 ENCOUNTER — Ambulatory Visit

## 2024-05-01 DIAGNOSIS — J302 Other seasonal allergic rhinitis: Secondary | ICD-10-CM | POA: Diagnosis not present

## 2024-05-21 ENCOUNTER — Ambulatory Visit: Admitting: Family

## 2024-07-12 ENCOUNTER — Encounter: Admitting: Physical Medicine & Rehabilitation

## 2024-07-16 ENCOUNTER — Ambulatory Visit: Admitting: Neurology
